# Patient Record
Sex: Female | Born: 1945
Health system: Southern US, Community
[De-identification: ages and names within clinical notes are randomized; demographics above are authoritative.]

## PROBLEM LIST (undated history)

## (undated) DIAGNOSIS — E7800 Pure hypercholesterolemia, unspecified: Secondary | ICD-10-CM

## (undated) DIAGNOSIS — K449 Diaphragmatic hernia without obstruction or gangrene: Secondary | ICD-10-CM

## (undated) DIAGNOSIS — G8929 Other chronic pain: Secondary | ICD-10-CM

## (undated) DIAGNOSIS — K219 Gastro-esophageal reflux disease without esophagitis: Secondary | ICD-10-CM

## (undated) DIAGNOSIS — F419 Anxiety disorder, unspecified: Secondary | ICD-10-CM

## (undated) DIAGNOSIS — R109 Unspecified abdominal pain: Secondary | ICD-10-CM

## (undated) DIAGNOSIS — I10 Essential (primary) hypertension: Secondary | ICD-10-CM

## (undated) HISTORY — PX: ABDOMINAL HYSTERECTOMY: SHX81

---

## 2000-06-15 ENCOUNTER — Inpatient Hospital Stay (HOSPITAL_COMMUNITY): Admission: AD | Admit: 2000-06-15 | Discharge: 2000-06-18 | Payer: Self-pay | Admitting: Family Medicine

## 2000-06-15 ENCOUNTER — Encounter: Payer: Self-pay | Admitting: Family Medicine

## 2001-02-26 ENCOUNTER — Ambulatory Visit (HOSPITAL_COMMUNITY): Admission: RE | Admit: 2001-02-26 | Discharge: 2001-02-26 | Payer: Self-pay | Admitting: Family Medicine

## 2001-02-26 ENCOUNTER — Encounter: Payer: Self-pay | Admitting: Family Medicine

## 2001-06-10 ENCOUNTER — Ambulatory Visit (HOSPITAL_COMMUNITY): Admission: RE | Admit: 2001-06-10 | Discharge: 2001-06-10 | Payer: Self-pay | Admitting: Family Medicine

## 2001-06-10 ENCOUNTER — Encounter: Payer: Self-pay | Admitting: Family Medicine

## 2002-01-21 ENCOUNTER — Inpatient Hospital Stay (HOSPITAL_COMMUNITY): Admission: EM | Admit: 2002-01-21 | Discharge: 2002-01-24 | Payer: Self-pay

## 2002-02-15 ENCOUNTER — Ambulatory Visit (HOSPITAL_COMMUNITY): Admission: RE | Admit: 2002-02-15 | Discharge: 2002-02-15 | Payer: Self-pay | Admitting: Internal Medicine

## 2002-02-28 ENCOUNTER — Ambulatory Visit (HOSPITAL_COMMUNITY): Admission: RE | Admit: 2002-02-28 | Discharge: 2002-02-28 | Payer: Self-pay | Admitting: Family Medicine

## 2002-02-28 ENCOUNTER — Encounter: Payer: Self-pay | Admitting: Family Medicine

## 2002-03-08 ENCOUNTER — Ambulatory Visit (HOSPITAL_COMMUNITY): Admission: RE | Admit: 2002-03-08 | Discharge: 2002-03-08 | Payer: Self-pay | Admitting: Internal Medicine

## 2003-03-20 ENCOUNTER — Ambulatory Visit (HOSPITAL_COMMUNITY): Admission: RE | Admit: 2003-03-20 | Discharge: 2003-03-20 | Payer: Self-pay | Admitting: Family Medicine

## 2003-05-22 ENCOUNTER — Ambulatory Visit (HOSPITAL_COMMUNITY): Admission: RE | Admit: 2003-05-22 | Discharge: 2003-05-22 | Payer: Self-pay | Admitting: Family Medicine

## 2004-03-21 ENCOUNTER — Ambulatory Visit (HOSPITAL_COMMUNITY): Admission: RE | Admit: 2004-03-21 | Discharge: 2004-03-21 | Payer: Self-pay | Admitting: Family Medicine

## 2005-03-12 ENCOUNTER — Ambulatory Visit (HOSPITAL_COMMUNITY): Admission: RE | Admit: 2005-03-12 | Discharge: 2005-03-12 | Payer: Self-pay | Admitting: Family Medicine

## 2005-04-01 ENCOUNTER — Ambulatory Visit (HOSPITAL_COMMUNITY): Admission: RE | Admit: 2005-04-01 | Discharge: 2005-04-01 | Payer: Self-pay | Admitting: Family Medicine

## 2006-04-03 ENCOUNTER — Ambulatory Visit (HOSPITAL_COMMUNITY): Admission: RE | Admit: 2006-04-03 | Discharge: 2006-04-03 | Payer: Self-pay | Admitting: Family Medicine

## 2006-11-26 ENCOUNTER — Ambulatory Visit (HOSPITAL_COMMUNITY): Admission: RE | Admit: 2006-11-26 | Discharge: 2006-11-26 | Payer: Self-pay | Admitting: Family Medicine

## 2007-02-07 ENCOUNTER — Emergency Department (HOSPITAL_COMMUNITY): Admission: EM | Admit: 2007-02-07 | Discharge: 2007-02-07 | Payer: Self-pay | Admitting: Emergency Medicine

## 2007-02-12 ENCOUNTER — Encounter: Payer: Self-pay | Admitting: Orthopedic Surgery

## 2007-02-12 ENCOUNTER — Ambulatory Visit (HOSPITAL_COMMUNITY): Admission: RE | Admit: 2007-02-12 | Discharge: 2007-02-12 | Payer: Self-pay | Admitting: Family Medicine

## 2007-03-25 ENCOUNTER — Ambulatory Visit: Payer: Self-pay | Admitting: Orthopedic Surgery

## 2007-03-25 DIAGNOSIS — M75120 Complete rotator cuff tear or rupture of unspecified shoulder, not specified as traumatic: Secondary | ICD-10-CM | POA: Insufficient documentation

## 2007-03-25 DIAGNOSIS — M25819 Other specified joint disorders, unspecified shoulder: Secondary | ICD-10-CM | POA: Insufficient documentation

## 2007-03-25 DIAGNOSIS — M7580 Other shoulder lesions, unspecified shoulder: Secondary | ICD-10-CM

## 2007-03-25 DIAGNOSIS — E119 Type 2 diabetes mellitus without complications: Secondary | ICD-10-CM | POA: Insufficient documentation

## 2007-03-25 DIAGNOSIS — Z8679 Personal history of other diseases of the circulatory system: Secondary | ICD-10-CM | POA: Insufficient documentation

## 2007-03-25 DIAGNOSIS — M25519 Pain in unspecified shoulder: Secondary | ICD-10-CM | POA: Insufficient documentation

## 2007-04-05 ENCOUNTER — Ambulatory Visit (HOSPITAL_COMMUNITY): Admission: RE | Admit: 2007-04-05 | Discharge: 2007-04-05 | Payer: Self-pay | Admitting: Family Medicine

## 2007-04-13 ENCOUNTER — Encounter (HOSPITAL_COMMUNITY): Admission: RE | Admit: 2007-04-13 | Discharge: 2007-05-13 | Payer: Self-pay | Admitting: Orthopedic Surgery

## 2007-04-13 ENCOUNTER — Encounter: Payer: Self-pay | Admitting: Orthopedic Surgery

## 2007-05-17 ENCOUNTER — Encounter (HOSPITAL_COMMUNITY): Admission: RE | Admit: 2007-05-17 | Discharge: 2007-06-16 | Payer: Self-pay | Admitting: Orthopedic Surgery

## 2007-05-24 ENCOUNTER — Encounter: Payer: Self-pay | Admitting: Orthopedic Surgery

## 2007-05-26 ENCOUNTER — Ambulatory Visit: Payer: Self-pay | Admitting: Orthopedic Surgery

## 2007-06-19 ENCOUNTER — Emergency Department (HOSPITAL_COMMUNITY): Admission: EM | Admit: 2007-06-19 | Discharge: 2007-06-19 | Payer: Self-pay | Admitting: Emergency Medicine

## 2007-06-30 ENCOUNTER — Ambulatory Visit (HOSPITAL_COMMUNITY): Admission: RE | Admit: 2007-06-30 | Discharge: 2007-06-30 | Payer: Self-pay | Admitting: Family Medicine

## 2007-07-05 ENCOUNTER — Ambulatory Visit (HOSPITAL_COMMUNITY): Admission: RE | Admit: 2007-07-05 | Discharge: 2007-07-05 | Payer: Self-pay | Admitting: Neurology

## 2007-07-20 ENCOUNTER — Ambulatory Visit (HOSPITAL_COMMUNITY): Admission: RE | Admit: 2007-07-20 | Discharge: 2007-07-20 | Payer: Self-pay | Admitting: Nephrology

## 2007-08-15 ENCOUNTER — Ambulatory Visit: Admission: RE | Admit: 2007-08-15 | Discharge: 2007-08-15 | Payer: Self-pay | Admitting: Neurology

## 2008-05-17 ENCOUNTER — Ambulatory Visit (HOSPITAL_COMMUNITY): Admission: RE | Admit: 2008-05-17 | Discharge: 2008-05-17 | Payer: Self-pay | Admitting: Family Medicine

## 2009-05-16 ENCOUNTER — Emergency Department (HOSPITAL_COMMUNITY): Admission: EM | Admit: 2009-05-16 | Discharge: 2009-05-16 | Payer: Self-pay | Admitting: Emergency Medicine

## 2009-06-05 ENCOUNTER — Ambulatory Visit (HOSPITAL_COMMUNITY): Admission: RE | Admit: 2009-06-05 | Discharge: 2009-06-05 | Payer: Self-pay | Admitting: Family Medicine

## 2010-04-02 NOTE — Miscellaneous (Signed)
Summary: Rehab Report  Rehab Report   Imported By: Jeanelle Malling 06/03/2007 10:35:17  _____________________________________________________________________  External Attachment:    Type:   Image     Comment:   progress

## 2010-04-02 NOTE — Assessment & Plan Note (Signed)
Summary: lft shoulder pain/to bring mri film and report.ca [id number]   Vital Signs:  Patient Profile:   65 Years Old Female Weight:      220 pounds Pulse rate:   80 / minute Resp:     16 per minute  Vitals Entered By: Peter Minium (March 25, 2007 11:18 AM)                 Chief Complaint:  left shoulder pain.  History of Present Illness: I saw Holly Baldwin in the office today for an initial visit.  Referral from [place].  She is a 65 years old woman with the complaint of:  left shoulder pain with radiation to elbow, no further down.  She fell before July in basement and landed on left shoulder.  Patient had MRI of left shoulder at [facility] on 02-12-07 which stated that she had been having pain 4 months prior to mri.  Mri read small full thickness non-retracted tear of the supraspinatus tendon with associated moderate fluid in the subacromial/sub deltoid bursa. No evidence of a labral tear.  In July 2008 she had xrays at [facility] of shoulder which read normal examination.  She has decreased ROM.  Pain level today is 4, she can not lay on her shoulder or raise arm high, she has problems lifting things.  She has had 2 cortisone shots in the past over a month ago, did not help. She has not had any physical therapy. She does her own exercises for her shoulder, not helping.  She has popping of her shoulder too.    Current Allergies (reviewed today): ! PCN Updated/Current Medications (including changes made in today's visit):  * METFORMIN  * LEVOTHYROXINE  * FLUOXETINE  * BUTALBITAL  * ACTOS  * OMEPRAZOLE  * GEDON  * SIMVASTATIN  * BENICAR  LORTAB 2.5 2.5-500 MG  TABS (HYDROCODONE-ACETAMINOPHEN) 1 q 4 as needed pain   Past Medical History:    Diabetes    Depression    Migraines    High Blood Pressure    cholesterol    thyroid  Past Surgical History:    Cholecystectomy   Family History:    Family History of Arthritis    Family History of  Diabetes    Hx, family, asthma  Social History:    Patient is married.     house wife   Risk Factors:  Tobacco use:  never Caffeine use:  0 drinks per day Alcohol use:  no   Review of Systems  GI      Complains of nausea.  Neuro      Complains of headache and migraines.  Psych      Complains of depression.  Immunology      Complains of sinus problems and allergic to bee stings.   Physical Exam  Skin:     intact without lesions or rashes Cervical Nodes:     no significant adenopathy Psych:     alert and cooperative; normal mood and affect; normal attention span and concentration   Shoulder/Elbow Exam  General:    Well-developed, well-nourished, normal body habitus; no deformities, normal grooming.    Skin:    Intact, no scars, lesions, rashes, cafe au lait spots or bruising.    Vascular:    Radial, ulnar, brachial, and axillary pulses 2+ and symmetric; capillary refill less than 2 seconds; no evidence of ischemia, clubbing, or cyanosis.    Sensory:    Gross sensation intact  in the upper extremities.    Motor:    decreased L-supraspinatus.    right normal   Reflexes:    Normal reflexes in the upper extremities.    Shoulder Exam:    Right:    Inspection:  Normal    Palpation:  Normal    Stability:  stable    Tenderness:  no    Swelling:  no    Erythema:  no    Range of Motion:       Flexion-Active: 180       Extension-Active: 45       Flexion-Passive: 180       Extension-Passive: 45       External Rotation : 45       Interior Rotation : T7    Left:    Inspection:  Abnormal    Palpation:  Abnormal    Stability:  stable    Tenderness:  left deltoid    Swelling:  no    Erythema:  no    Range of Motion:       Flexion-Active: 100       Extension-Active: 45       Flexion-Passive: 180       Extension-Passive: 45       External Rotation : 45       Interior Rotation : T10   LE normal ROM, strength, stability, alignment      Impression & Recommendations:  Problem # 1:  SHOULDER PAIN (ICD-719.41) Assessment: New  Her updated medication list for this problem includes:    Lortab 2.5 2.5-500 Mg Tabs (Hydrocodone-acetaminophen) .Marland Kitchen... 1 q 4 as needed pain  Orders: New Patient Level III [id number]) Joint Aspirate / Injection, Large (20610)   Problem # 2:  RUPTURE ROTATOR CUFF (ICD-727.61) Assessment: New The MRI and the report was reviewed see documents section  The x-rays were done at [facility]. The report and the films have been reviewed.  Orders: New Patient Level III [id number]) Depo- Medrol 40mg  (J1030) Joint Aspirate / Injection, Large (20610) Verbal consent obtained/The left shoulder was injected with depomedrol 40mg /cc and sensorcaine .25% . There were no complications   Orders: New Patient Level III [id number]) Depo- Medrol 40mg  (J1030) Joint Aspirate / Injection, Large [id number])   Problem # 3:  IMPINGEMENT SYNDROME (ICD-726.2) Assessment: New  Orders: New Patient Level III [id number])   Medications Added to Medication List This Visit: 1)  Metformin  2)  Levothyroxine  3)  Fluoxetine  4)  Butalbital  5)  Actos  6)  Omeprazole  7)  Gedon  8)  Simvastatin  9)  Benicar  10)  Lortab 2.5 2.5-500 Mg Tabs (Hydrocodone-acetaminophen) .Marland Kitchen.. 1 q 4 as needed pain  Other Orders: Physical Therapy Referral (PT)   Patient Instructions: 1)  Please schedule a follow-up appointment in 2 months. 2)  Limit activity to comfort and avoid activities that increase discomfort.  Apply moist heat and/or ice to shoulder and take medication as instructed for pain relief. Please read the Shoulder Pain Handout and start Physical Therapy as directed.    Prescriptions: LORTAB 2.5 2.5-500 MG  TABS (HYDROCODONE-ACETAMINOPHEN) 1 q 4 as needed pain  #90 x 2   Entered and Authorized by:   Arther Abbott MD   Signed by:   Arther Abbott MD on 03/25/2007   Method used:   Print then Give to Patient   RxID:    [id number]  ]

## 2010-04-02 NOTE — Assessment & Plan Note (Signed)
Summary: RE-CK LT SHOULDER FOL'G PT/[place] MEDICAID/CAF    History of Present Illness: I saw Holly Baldwin in the office today for a 2 month followup visit.  She is a 65 years old woman with the complaint of:  left shoulder pain.  Patient had an injection in her shoulder on her last visit here. She went to therapy for 6 weeks Patient states that her shoulder feels better. She states that it just has a little irritation. she says that her ROM has improved. She can raise her arm and put it behind her now.    Current Allergies: ! PCN     Review of Systems  MS      Denies joint pain, rheumatoid arthritis, joint swelling, gout, bone cancer, osteoporosis, and .   Physical Exam  Cervical Nodes:     no significant adenopathy Psych:     alert and cooperative; normal mood and affect; normal attention span and concentration   Shoulder/Elbow Exam  General:    Well-developed, well-nourished, normal body habitus; no deformities, normal grooming.    Skin:    Intact, no scars, lesions, rashes, cafe au lait spots or bruising.    Shoulder Exam:    Right:    Inspection:  Normal    Palpation:  Normal    Stability:  stable    Tenderness:  no    Swelling:  no    Erythema:  no    Range of Motion:       Flexion-Active: 180       Extension-Active: 45       Flexion-Passive: 180       Extension-Passive: 45       External Rotation : 45       Interior Rotation : T12    Left:    Inspection:  Normal    Palpation:  Normal    Stability:  stable    Tenderness:  no    Swelling:  no    Erythema:  no    Range of Motion:       Flexion-Active: 180       Extension-Active: 45       Flexion-Passive: 180       Extension-Passive: 45       External Rotation : 45       Interior Rotation : T12  Impingement Sign NEER:    Left negative Impingement Sign HAWKINS:    Left negative Apprehension Sign:    Left negative    Impression & Recommendations:  Problem # 1:  IMPINGEMENT SYNDROME  (ICD-726.2) Assessment: Improved  Orders: Est. Patient Level III [id number])    Patient Instructions: 1)  Please schedule a follow-up appointment as needed.    ]

## 2010-04-02 NOTE — Miscellaneous (Signed)
Summary: Rehab Report  Rehab Report   Imported By: Ruffin Pyo 05/20/2007 14:33:18  _____________________________________________________________________  External Attachment:    Type:   Image     Comment:   OT CLINICAL EVALUATION

## 2010-04-02 NOTE — Miscellaneous (Signed)
Summary: Rehab PT order [facility]  Rehab PT order [facility]   Imported By: Ihor Austin 03/29/2007 18:55:58  _____________________________________________________________________  External Attachment:    Type:   Image     Comment:   External Document

## 2010-04-03 ENCOUNTER — Emergency Department (HOSPITAL_COMMUNITY)
Admission: EM | Admit: 2010-04-03 | Discharge: 2010-04-03 | Disposition: A | Discharge status: Home / Self Care | Payer: Medicare PPO | Attending: Emergency Medicine | Admitting: Emergency Medicine

## 2010-04-03 DIAGNOSIS — Z79899 Other long term (current) drug therapy: Secondary | ICD-10-CM | POA: Insufficient documentation

## 2010-04-03 DIAGNOSIS — L2989 Other pruritus: Secondary | ICD-10-CM | POA: Insufficient documentation

## 2010-04-03 DIAGNOSIS — G4700 Insomnia, unspecified: Secondary | ICD-10-CM | POA: Insufficient documentation

## 2010-04-03 DIAGNOSIS — L298 Other pruritus: Secondary | ICD-10-CM | POA: Insufficient documentation

## 2010-04-03 DIAGNOSIS — E1142 Type 2 diabetes mellitus with diabetic polyneuropathy: Secondary | ICD-10-CM | POA: Insufficient documentation

## 2010-04-03 DIAGNOSIS — E1149 Type 2 diabetes mellitus with other diabetic neurological complication: Secondary | ICD-10-CM | POA: Insufficient documentation

## 2010-04-03 DIAGNOSIS — I10 Essential (primary) hypertension: Secondary | ICD-10-CM | POA: Insufficient documentation

## 2010-04-03 LAB — URINALYSIS, ROUTINE W REFLEX MICROSCOPIC
Bilirubin Urine: NEGATIVE
Ketones, ur: 15 mg/dL — AB
Leukocytes, UA: NEGATIVE
Nitrite: NEGATIVE
Protein, ur: 100 mg/dL — AB
Specific Gravity, Urine: 1.01 (ref 1.005–1.030)
Urine Glucose, Fasting: >1000 mg/dL — AB
Urobilinogen, UA: 0.2 mg/dL (ref 0.0–1.0)
pH: 5.5 (ref 5.0–8.0)

## 2010-04-03 LAB — CBC
HCT: 42.2 % (ref 36.0–46.0)
Hemoglobin: 14.2 g/dL (ref 12.0–15.0)
MCH: 26.3 pg (ref 26.0–34.0)
MCHC: 33.6 g/dL (ref 30.0–36.0)
MCV: 78.3 fL (ref 78.0–100.0)
Platelets: 274 10*3/uL (ref 150–400)
RBC: 5.39 MIL/uL — ABNORMAL HIGH (ref 3.87–5.11)
RDW: 14.4 % (ref 11.5–15.5)
WBC: 7.7 10*3/uL (ref 4.0–10.5)

## 2010-04-03 LAB — DIFFERENTIAL
Basophils Absolute: 0 10*3/uL (ref 0.0–0.1)
Basophils Relative: 0 % (ref 0–1)
Eosinophils Absolute: 0 10*3/uL (ref 0.0–0.7)
Eosinophils Relative: 1 % (ref 0–5)
Lymphocytes Relative: 18 % (ref 12–46)
Lymphs Abs: 1.4 10*3/uL (ref 0.7–4.0)
Monocytes Absolute: 0.5 10*3/uL (ref 0.1–1.0)
Monocytes Relative: 7 % (ref 3–12)
Neutro Abs: 5.6 10*3/uL (ref 1.7–7.7)
Neutrophils Relative %: 74 % (ref 43–77)

## 2010-04-03 LAB — BASIC METABOLIC PANEL
BUN: 11 mg/dL (ref 6–23)
CO2: 27 mEq/L (ref 19–32)
Calcium: 9.8 mg/dL (ref 8.4–10.5)
Chloride: 97 mEq/L (ref 96–112)
Creatinine, Ser: 0.65 mg/dL (ref 0.4–1.2)
GFR calc Af Amer: >60 mL/min (ref >60)
GFR calc non Af Amer: >60 mL/min (ref >60)
Glucose, Bld: 459 mg/dL — ABNORMAL HIGH (ref 70–99)
Potassium: 4.1 mEq/L (ref 3.5–5.1)
Sodium: 137 mEq/L (ref 135–145)

## 2010-04-03 LAB — URINE MICROSCOPIC-ADD ON

## 2010-04-03 LAB — GLUCOSE, CAPILLARY: Glucose-Capillary: 312 mg/dL — ABNORMAL HIGH (ref 70–99)

## 2010-05-24 LAB — DIFFERENTIAL
Basophils Absolute: 0 10*3/uL (ref 0.0–0.1)
Basophils Relative: 0 % (ref 0–1)
Eosinophils Absolute: 0.1 10*3/uL (ref 0.0–0.7)
Eosinophils Relative: 2 % (ref 0–5)
Lymphocytes Relative: 16 % (ref 12–46)
Lymphs Abs: 1.5 10*3/uL (ref 0.7–4.0)
Monocytes Absolute: 0.6 10*3/uL (ref 0.1–1.0)
Monocytes Relative: 7 % (ref 3–12)
Neutro Abs: 6.7 10*3/uL (ref 1.7–7.7)
Neutrophils Relative %: 75 % (ref 43–77)

## 2010-05-24 LAB — CBC
HCT: 40.2 % (ref 36.0–46.0)
Hemoglobin: 13.4 g/dL (ref 12.0–15.0)
MCHC: 33.3 g/dL (ref 30.0–36.0)
MCV: 80.1 fL (ref 78.0–100.0)
Platelets: 264 10*3/uL (ref 150–400)
RBC: 5.02 MIL/uL (ref 3.87–5.11)
RDW: 15 % (ref 11.5–15.5)
WBC: 9 10*3/uL (ref 4.0–10.5)

## 2010-05-24 LAB — COMPREHENSIVE METABOLIC PANEL
ALT: 32 U/L (ref 0–35)
AST: 37 U/L (ref 0–37)
Albumin: 3.6 g/dL (ref 3.5–5.2)
Alkaline Phosphatase: 141 U/L — ABNORMAL HIGH (ref 39–117)
BUN: 9 mg/dL (ref 6–23)
CO2: 27 mEq/L (ref 19–32)
Calcium: 8.8 mg/dL (ref 8.4–10.5)
Chloride: 100 mEq/L (ref 96–112)
Creatinine, Ser: 0.62 mg/dL (ref 0.4–1.2)
GFR calc Af Amer: >60 mL/min (ref >60)
GFR calc non Af Amer: >60 mL/min (ref >60)
Glucose, Bld: 362 mg/dL — ABNORMAL HIGH (ref 70–99)
Potassium: 4.3 mEq/L (ref 3.5–5.1)
Sodium: 135 mEq/L (ref 135–145)
Total Bilirubin: 0.5 mg/dL (ref 0.3–1.2)
Total Protein: 6.7 g/dL (ref 6.0–8.3)

## 2010-05-24 LAB — LIPASE, BLOOD: Lipase: 16 U/L (ref 11–59)

## 2010-07-16 NOTE — Procedures (Signed)
NAME:  Holly Baldwin, Holly Baldwin                  ACCOUNT NO.:  [id number]   MEDICAL RECORD NO.:  [id number]          PATIENT TYPE:  OUT   LOCATION:  SLEE                          FACILITY:  [facility]   PHYSICIAN:  Kofi A. Merlene Laughter, M.D. DATE OF BIRTH:  05-26-1945   DATE OF PROCEDURE:  08/15/2007  DATE OF DISCHARGE:  08/15/2007                             SLEEP DISORDER REPORT   POLYSOMNOGRAPHY REPORT   INDICATIONS:  A 65 year old lady who presents with fatigue, snoring, and  is being evaluated for obstructive sleep apnea syndrome, BMI 38, Epworth  Sleepiness Scale 6.   MEDICATIONS:  Prozac, bisoprolol, oxycodone, Actos, Norvasc, clonidine,  Topamax, Geodon, Fioricet, and Maxalt.   SLEEP STATE SUMMARY:  The total recording time is 435 minutes, sleep  efficiency is 45%, sleep latency 26 minutes, REM latency 376 minutes,  stage N1 22%, N2 70%, slow-wave sleep 0%, and REM sleep 8%.   RESPIRATORY SUMMARY:  Baseline oxygen saturation 98%, lowest saturation  92%, the AHI is 2.5.   LIMB MOVEMENT SUMMARY:  PLM index 0.   ELECTROCARDIOGRAM SUMMARY:  Average heart rate 60 with PVCs noted  throughout the recording.   IMPRESSION:  This recording shows poor sleep efficiency, but otherwise  unremarkable.      Kofi A. Merlene Laughter, M.D.  Electronically Signed     KAD/MEDQ  D:  08/21/2007  T:  08/22/2007  Job:  [id number]

## 2010-07-19 NOTE — Op Note (Signed)
   NAME:  Baldwin, Holly                              ACCOUNT NO.:  [id number]   MEDICAL RECORD NO.:  [id number]                   PATIENT TYPE:  AMB   LOCATION:  DAY                                  FACILITY:  [facility]   PHYSICIAN:  Hildred Laser, M.D.                 DATE OF BIRTH:  22-Feb-1946   DATE OF PROCEDURE:  03/08/2002  DATE OF DISCHARGE:                                 OPERATIVE REPORT   PROCEDURE PERFORMED:  Total colonoscopy.   INDICATIONS FOR PROCEDURE:  The patient is a 65 year old African-American  female who presented last month with epigastric and chest pain as well as  microcytic anemia suspicious for iron deficiency.  She had  esophagogastroduodenoscopy on February 16, 2002.  She had a small sliding  hiatal hernia.  Otherwise normal examination.  She has been maintained on  Protonix and her epigastric and chest pain was resolved.  She is now  returning for colonoscopy.  She denies melena or rectal bleeding.  She has  been on Celebrex in the past.   Family history is negative for colorectal carcinoma.   PREOP MEDICATIONS:  Demerol 50 mg IV, Versed 9 mg IV in divided dose.   INSTRUMENT USED:  Olympus video system.   DESCRIPTION OF PROCEDURE:  Procedure performed in endoscopy suite.  Patient's vital signs and oxygen saturations monitored during the procedure  and remained stable.  The patient was placed in lateral position and rectal  examination performed.  This was within normal limits.  Scope was placed  rectum and advanced to region of sigmoid colon and beyond.  Preparation was  satisfactory.  Scope was passed to the cecum which was identified by the  ileocecal valve and appendiceal orifice.  Pictures taken for the record.  As  the scope was withdrawn, colonic mucosa was carefully examined.  There was a  3 to 4 mm polyp at the distal sigmoid colon which was ablated by cold  biopsy.  Rectal mucosa was normal.  Scope was retroflexed to examine  anorectal area which was  unremarkable.  Endoscope was then withdrawn.  The  patient tolerated the procedure well.   FINAL DIAGNOSIS:  Examination performed to cecum.  Single small polyp  ablated by cold biopsy from the sigmoid colon.   RECOMMENDATIONS:  She will continue antireflux measures and Prilosec as  before.  She will have a CBC by Bonne Dolores, M.D. on her next visit in the  next few weeks.                                               Hildred Laser, M.D.    NR/MEDQ  D:  03/08/2002  T:  03/08/2002  Job:  [id number]

## 2010-07-19 NOTE — Consult Note (Signed)
NAME:  MIMSJermaya, Drucker                              ACCOUNT NO.:  [id number]   MEDICAL RECORD NO.:  [id number]                   PATIENT TYPE:   LOCATION:                                       FACILITY:  [facility]   PHYSICIAN:  Hildred Laser, M.D.                 DATE OF BIRTH:  Oct 25, 1945   DATE OF CONSULTATION:  02/07/2002  DATE OF DISCHARGE:                                   CONSULTATION   REASON FOR CONSULTATION:  Epigastric pain.   HISTORY OF PRESENT ILLNESS:  The patient is a 65 year old black female  patient of Dr. Gerarda Fraction who presents today for further evaluation of epigastric  pain as referred by Dr. Lattie Haw.  She recently saw Dr. Gerarda Fraction in November  2003.  She was having upper respiratory type symptoms.  She was treated, but  continued to feel tired, fatigued, and complained of epigastric and  substernal chest pain.  The pain radiated into her neck.  She was sent to  [facility] for further work-up, to exclude heart disease.  She was  hospitalized at [facility] from November 24 through the 25th.  She had a  stress Cardiolite test which revealed EF of 61% and no evidence of  myocardial or definite scar on the scan.  She also had first degree AV block  seen on EKG.  She was noted to have a hemoglobin 11.3, hematocrit 34.1, MCV  75.8.  Stool was hemoccult-negative.  It was felt that her epigastric  burning was probably secondary to gastroesophageal reflux disease.  She was  placed on Protonix 40 mg b.i.d. and asked to follow up with [place] today.  The  patient says she is having nocturnal symptoms, epigastric burning, bloating  associated with regurgitation of acid.  At times she will gag when she  regurgitates.  She was told to prop herself up in bed which has seemed to  help.  She has typical heartburn symptoms.  She has chronic acid reflux  disease and has been on Prevacid for years, but recently had to switch to  Protonix due to increasing cost of the medication.  Certain foods  definitely  exacerbate her symptoms.  She denies any dysphagia, odynophagia.  Denies any  constipation or diarrhea currently, although she did have some diarrhea  several weeks ago related to a viral infection.  Denies any melena or  bright red blood per rectum.  She has had no changes in her weight.  She has  been on Celebrex off and on for arthritic type pain.  She says she has not  been on any really in the last six weeks, however.   CURRENT MEDICATIONS:  1. Chlorthalidone 25 mg half tablet q.d.  2. Premarin 0.625 mg q.d.  3. Protonix 40 mg b.i.d.  4. Lotrel 5/20 mg q.d.  5. Lotrel 10/20 mg q.d.  6. Glucophage XR 500  mg two tablets q.d.  7. Avandia 8 mg q.d.  8. Albuterol twice q.a.m. and twice q.p.m. nebulizer p.r.n.   ALLERGIES:  No known drug allergies.   PAST MEDICAL HISTORY:  1. Chronic gastroesophageal reflux disease.  She had EGD in 1994 by Dr.     Laural Golden which revealed mild changes in reflux esophagitis with a small     sliding hiatal hernia, proximal gastritis.  2. She has non-insulin-dependent diabetes mellitus.  3. Hypertension.  4. Asthmatic bronchitis.   PAST SURGICAL HISTORY:  1. Hysterectomy.  2. Cholecystectomy.  3. Appendectomy.   FAMILY HISTORY:  Mother died of yellow jaundice.  Father died of a stroke.  He was also diabetic.  She has six sisters and four brothers who are  diabetics.  Denies any family history of colorectal cancer.   SOCIAL HISTORY:  She has been married for 35 years.  She has three grown  children.  She is a housewife.  She has never been a smoker.  Denies any  alcohol use.   REVIEW OF SYSTEMS:  Please see HPI for GI.  CARDIOPULMONARY:  She has  shortness of breath related to her asthmatic bronchitis at times.  Denies  any chest pains in the last couple weeks.  GENERAL:  Weight stable.   PHYSICAL EXAMINATION:  VITAL SIGNS:  Weight 218, height 5 feet 4 inches,  temperature 98.1, blood pressure 124/70, pulse 74.  GENERAL:  Pleasant,  well-nourished, well-developed black female in no acute  distress.  SKIN:  Warm and dry.  No jaundice.  HEENT:  Conjunctivae are pink.  Sclerae nonicteric.  Oropharyngeal mucosa  moist and pink.  No lesions, erythema, or exudate.  NECK:  No lymphadenopathy, thyromegaly, carotid bruits.  CHEST:  Lungs are clear to auscultation.  CARDIAC:  Faint systolic ejection murmur heard best in the upper precordium.  No rubs or gallops.  ABDOMEN:  Positive bowel sounds, obese, but symmetrical.  Mild to moderate  tenderness in the epigastric region with deep palpation.  No organomegaly or  masses.  EXTREMITIES:  No edema.   IMPRESSION:  The patient is a pleasant 65 year old lady who has chronic  gastroesophageal reflux disease with breakthrough symptoms since switching  from Prevacid to Protonix.  She is having epigastric pain as well as  bloating postprandially.  She has been on Celebrex intermittently for  several months, but has not taken any in the last six weeks.  Symptoms are  concerning for poorly controlled gastroesophageal reflux disease plus or  minus peptic ulcer disease.   She was also noted to have a mild microcytic anemia during recent  hospitalization.  She was hemoccult-negative at least once.  Given these  findings, she definitely needs to have her upper GI tract as well as  colonoscopy in the near future.  She would like to proceed with upper  endoscopy at this time with plans to have colonoscopy at some point in the  near future which I feel is reasonable.  I discussed risks, alternatives,  and benefits with the patient.  She is agreeable to proceed.   PLAN:  1. Continue Protonix 40 mg p.o. b.i.d. for now.  2. EGD.  3.     Colonoscopy at some point in the near future.  4. I have asked her to discontinue Celebrex use for now.   I would like to thank Dr. Lattie Haw for allowing [place] to take part in the care  of this patient.    Neil Crouch, P.A.  Hildred Laser, M.D.    LL/MEDQ  D:  02/07/2002  T:  02/07/2002  Job:  [id number]   cc:   Jacqulyn Ducking, M.D. [facility]  [address] [address]  Fax: Ila Gerarda Fraction, M.D.  [address] [address]  Fax: [phone]

## 2010-07-19 NOTE — H&P (Signed)
NAME:  Holly Baldwin, Holly Baldwin NO.:  [id number]   MEDICAL RECORD NO.:  [id number]                   PATIENT TYPE:  EMS   LOCATION:  [facility]                                 FACILITY:  [place]   PHYSICIAN:  Jacqulyn Ducking, M.D. [facility]           DATE OF BIRTH:  [birth year]   DATE OF ADMISSION:  01/21/2002  DATE OF DISCHARGE:                                HISTORY & PHYSICAL   PRIMARY CARDIOLOGIST:  Previously, Hassan Rowan, M.D.   HISTORY OF PRESENT ILLNESS:  A 65 year old woman with normal coronary  angiography six years ago but multiple coronary risk factors including  hypertension and diabetes, now presents with symptoms of an upper  respiratory tract infection, symptoms of GERD, and chest pain, neck pain,  and an abnormal ECG.  The patient was in her usual good state of health  until the past few weeks when she developed mildly productive cough and  wheezing subsequent to a influenza vaccination.  She was evaluated by her  primary care physician and treated with a parental medication followed by  oral medication, presumably an antibiotic.  She subsequently developed  burning discomfort in her stomach with tightness associated with burning in  her chest.  She has had some water brash. She has a history of GERD and is  chronically maintained on a PPI.  She has gotten some relief with Tums.  She  feels the need for eructation, but cannot do so.  She has had some diarrhea  as well. She has had some chills but no definite fever.  Her chest  discomfort is always there to some degree, but sometimes waxes - it is  generally mild.  There has been no associated dyspnea nor diaphoresis.  She  has a chronic history of right neck pain, which continues.  Her symptoms are  no different than they were before this acute illness.   MEDICATIONS:  1. Ziac 5/6.25 mg daily.  2. Premarin 0.625 mg daily.  3. Metformin 1 g daily.  4. Avandia 8 mg daily.  5. Lotrel 10/20 mg  daily.  6. Clarinex 1 daily p.r.n.  7. Celebrex 200 daily p.r.n.  8. Ventolin 2 puffs b.i.d. p.r.n.  9. Protonix 40 mg daily.  10.      Levaquin 500 mg daily.  11.      Aspirin 81 mg daily.   PAST MEDICAL HISTORY:  Notable for 10 years of diabetes that has not  required insulin.  She has had long-standing hypertension that has been well-  controlled.  She has asthmatic bronchitis and GERD.   PAST SURGICAL HISTORY:  Surgical history has included a hysterectomy and  cholecystectomy.   SOCIAL HISTORY:  Lives with her husband, works as a housewife.  No history  of tobacco or alcohol use.   FAMILY HISTORY:  Father suffered a CVA in his 62s. No significant cardiac  disease.   REVIEW  OF SYSTEMS:  Notable for headache, visual disturbance, and a need for  corrective lenses. Being postmenopausal and a history of depression and  anxiety.  All other systems negative.   PHYSICAL EXAMINATION:  VITAL SIGNS:  Temperature is 99, heart rate 80 and  regular, respirations 20, blood pressure 130/60.  O2 saturation 96% on room  air.  GENERAL:  Pleasant woman moving with some difficulty, but not appearing  acutely ill.  HEENT:  Anicteric sclerae.  NECK:  No jugular venous distention, bruit transmitted from the upper chest  bilaterally.  ENDOCRINE:  No thyromegaly.  SKIN:  No significant lesions.  HEMATOPOETIC:  No adenopathy.  LUNGS:  Crystal clear.  CARDIAC:  Normal first and second heart sounds, minimal systolic murmurs,  forth heart sound present.  ABDOMEN:  Tender at the costal margins, no organomegaly, normal bowel  sounds.  EXTREMITY:  No edema. Distal pulses intact.  NEUROMUSCULAR:  Symmetric strength and tone.   ECG:  Normal sinus rhythm, PVCs on some tracings, but not on the tracing  when she came to the emergency department, mild T wave flattening, decreased  voltage in the chest leads.   IMPRESSION/PLAN:  The patient has symptoms of an upper respiratory tract  infection.  The  discomfort in her upper abdomen appears to be related to  coughing.  Chest x-ray is pending - I doubt she has a pneumonia.  Her  abdominal and chest discomfort appear consistent with her known  gastroesophageal reflux disease, although she is taking appropriate  medication.  We will double her dose of proton pump inhibitor and add  antacids if needed. Initial lab does show mild hypokalemia - this along with  her acute noncardiac illness may account for her PVCs.  I doubt that she is  having primary cardiac problems, and do not necessarily plan any cardiac  testing other than to obtain serial markers and ECGs.  We will consult  primary care for management of this woman's hospitalization.                                                Jacqulyn Ducking, M.D. [facility]    RR/MEDQ  D:  01/21/2002  T:  01/22/2002  Job:  [id number]

## 2010-07-19 NOTE — Op Note (Signed)
NAME:  Baldwin, Holly                              ACCOUNT NO.:  [id number]   MEDICAL RECORD NO.:  [id number]                   PATIENT TYPE:  AMB   LOCATION:  DAY                                  FACILITY:  [facility]   PHYSICIAN:  Hildred Laser, M.D.                 DATE OF BIRTH:  03-08-1945   DATE OF PROCEDURE:  02/16/2002  DATE OF DISCHARGE:                                 OPERATIVE REPORT   PROCEDURE:  Esophagogastroduodenoscopy.   INDICATIONS:  The patient is a 65 year old African-American female with  chronic GERD, who was recently evaluated at [facility] for substernal chest pain.  A Cardiolite did not reveal any myocardial ischemia and EF was 61%.  At that  time she was also noted to have mild anemia with MCV of 75.8, but stool was  guaiac-negative.  She has also been complaining of epigastric pain and  having breakthrough symptoms of GERD.  She has been on Celebrex off and on,  but she has not taken it recently.  She is undergoing diagnostic EGD.  The  procedure was reviewed with the patient and informed consent was obtained.   PREMEDICATION:  Cetacaine spray for pharyngeal topical anesthesia, Demerol  50 mg IV, Versed 7 mg IV in divided dose.   INSTRUMENT USED:  Olympus video system.   FINDINGS:  Procedure performed in endoscopy suite.  The patient's vital  signs and O2 saturation were monitored during the procedure and remained  stable.  The patient was placed in the left lateral recumbent position, and  the endoscope was passed via the oropharynx without any difficulty into the  esophagus.   Esophagus:  Mucosa of the esophagus was normal throughout.  The  squamocolumnar junction was unremarkable.  She had a small sliding hiatal  hernia; however, the diaphragmatic hiatus was wide open.   Stomach:  It was empty and distended very well with insufflation.  Folds of  the proximal stomach were normal.  Examination of the mucosa at gastric  body, antrum, pyloric channel, as well as  angularis and fundus, was normal .   Duodenum:  Examination of the bulb revealed normal mucosa.  The scope was  passed to the second part of the duodenum, and the mucosa and folds were  normal.   The endoscope was withdrawn.  The patient tolerated the procedure well.   FINAL DIAGNOSES:  Small sliding hiatal hernia.  No endoscopic evidence of  reflux esophagitis or Barrett's esophagus.  Her diaphragmatic hiatus is wide  open.  This perhaps would explain the difficulty controlling her symptoms.  She may eventually need a promotility agent.   RECOMMENDATIONS:  1. Antireflux measures stressed to the patient.  2.     Will stop her Protonix and try her on Prilosec OTC 20 mg p.o. q.a.m.  3. She will undergo a colonoscopy both for diagnostic and screening purposes  within the next few weeks.  4. If she does not do well with Prilosec, will add metoclopramide at a low     dose.                                               Hildred Laser, M.D.    NR/MEDQ  D:  02/15/2002  T:  02/16/2002  Job:  [id number]   cc:   Sherrilee Gilles. Gerarda Fraction, M.D.  [address] [address]  Fax: [phone]   Jacqulyn Ducking, M.D. [facility]  [address] [address]  Fax: 1

## 2010-07-19 NOTE — Discharge Summary (Signed)
NAME:  Holly Baldwin, Holly Baldwin                              ACCOUNT NO.:  [id number]   MEDICAL RECORD NO.:  [id number]                   PATIENT TYPE:  INP   LOCATION:  2029                                 FACILITY:  [facility]   PHYSICIAN:  Jacqulyn Ducking, M.D. [facility]           DATE OF BIRTH:  04-07-1945   DATE OF ADMISSION:  01/21/2002  DATE OF DISCHARGE:  01/24/2002                           DISCHARGE SUMMARY - REFERRING   DISCHARGE DIAGNOSES:  1. Chest pain, resolved.  2. Epigastric burning, presumed gastroesophageal reflux disease, treated.  3. Hypertension, controlled.  4. First degree AV block.  5. Adult-onset diabetes mellitus, controlled.  6. History of bronchitis.   HISTORY OF PRESENT ILLNESS:  The patient is a 65 year old female patient of  Dr. Bonne Dolores who was recently treated for an upper respiratory  infection.  Despite treatment she continued to feel tired, fatigued, and  complained of epigastric/substernal chest pain.  The pain did radiate into  her neck and she was referred by her primary M.D. for cardiac evaluation.   HOSPITAL COURSE:  She was admitted on January 21, 2002 and laboratory  studies during this time revealed the following:  Hemoglobin 11.3,  hematocrit 34.1, platelets 238, white count 6.4.  Stool heme negative.  Sodium 142, potassium 4.3, glucose 128, BUN 9, creatinine 0.7.  Cardiac  isoenzymes and troponins were negative.  Total cholesterol was 198,  triglycerides 57, HDL 65, LDL 122.   Her EKG revealed normal sinus rhythm with a first degree AV block.   As part of her cardiac workup she did undergo a rest-stress Cardiolite and  her EF at that time was 61%.  There was no evidence of myocardial or  definite scar on the scan.   By January 24, 2002 she was felt to be ready for discharge to home.  She  was discharged to home in stable but improved condition.  At this time we  feel that her treatment should focus around avoiding beta blockers secondary  to her  first degree AV block as well as placing her on a twice-a-day regimen  of Protonix with plans for her to follow up with the GI physician in  [place].   DISCHARGE MEDICATIONS:  1. Chlorthalidone 12.5 mg one p.o. q.d.  2. She is to stop her Ziac.  3. Start Protonix 40 mg b.i.d.  4. Resume all of her other home medications which include:     a. Premarin 0.625 mg a day.     b. Glucophage 500 mg two tablets daily.     c. Avandia 8 mg a day.     d. Lotrel 10/20 one daily.     e. Clarinex p.r.n.     f. Celebrex 200 mg daily p.r.n.     g. Ventolin as needed.     h. An aspirin daily.        a. She may use Tylenol as needed  for pain.   ACTIVITY:  As tolerated.   DIET:  Remain on a low fat diabetic diet.   SPECIAL INSTRUCTIONS:  Call for questions or concerns.   FOLLOW-UP:  I have made her follow-up appointment with Dr. Jacqulyn Ducking  on February 17, 2002 at 1:15 p.m. at the [facility] in [place].     Joesphine Bare, P.A. [facility]                      Jacqulyn Ducking, M.D. [facility]    LB/MEDQ  D:  01/24/2002  T:  01/24/2002  Job:  [id number]   cc:   Bonne Dolores, M.D.  [address] [place] [address]  Fax: [phone]

## 2010-07-23 ENCOUNTER — Other Ambulatory Visit (HOSPITAL_COMMUNITY): Payer: Self-pay | Admitting: Family Medicine

## 2010-07-23 ENCOUNTER — Other Ambulatory Visit (HOSPITAL_COMMUNITY): Payer: Self-pay | Admitting: Internal Medicine

## 2010-07-23 DIAGNOSIS — Z139 Encounter for screening, unspecified: Secondary | ICD-10-CM

## 2010-08-02 ENCOUNTER — Ambulatory Visit (HOSPITAL_COMMUNITY)
Admission: RE | Admit: 2010-08-02 | Discharge: 2010-08-02 | Disposition: A | Discharge status: Home / Self Care | Payer: Medicare Other | Source: Ambulatory Visit | Attending: Family Medicine | Admitting: Family Medicine

## 2010-08-02 DIAGNOSIS — Z1231 Encounter for screening mammogram for malignant neoplasm of breast: Secondary | ICD-10-CM | POA: Insufficient documentation

## 2010-08-02 DIAGNOSIS — Z139 Encounter for screening, unspecified: Secondary | ICD-10-CM

## 2010-08-06 ENCOUNTER — Other Ambulatory Visit: Payer: Self-pay | Admitting: Family Medicine

## 2010-08-06 DIAGNOSIS — R928 Other abnormal and inconclusive findings on diagnostic imaging of breast: Secondary | ICD-10-CM

## 2010-08-14 ENCOUNTER — Ambulatory Visit (HOSPITAL_COMMUNITY)
Admission: RE | Admit: 2010-08-14 | Discharge: 2010-08-14 | Disposition: A | Discharge status: Home / Self Care | Payer: Medicare Other | Source: Ambulatory Visit | Attending: Family Medicine | Admitting: Family Medicine

## 2010-08-14 DIAGNOSIS — R928 Other abnormal and inconclusive findings on diagnostic imaging of breast: Secondary | ICD-10-CM

## 2011-01-17 ENCOUNTER — Emergency Department (HOSPITAL_COMMUNITY)
Admission: EM | Admit: 2011-01-17 | Discharge: 2011-01-17 | Disposition: A | Discharge status: Home / Self Care | Payer: Medicare Other | Attending: Emergency Medicine | Admitting: Emergency Medicine

## 2011-01-17 ENCOUNTER — Encounter: Payer: Self-pay | Admitting: Emergency Medicine

## 2011-01-17 ENCOUNTER — Other Ambulatory Visit: Payer: Self-pay

## 2011-01-17 ENCOUNTER — Encounter (HOSPITAL_COMMUNITY): Payer: Self-pay

## 2011-01-17 ENCOUNTER — Emergency Department (HOSPITAL_COMMUNITY)
Admission: EM | Admit: 2011-01-17 | Discharge: 2011-01-18 | Disposition: A | Discharge status: Home / Self Care | Payer: Medicare Other | Attending: Emergency Medicine | Admitting: Emergency Medicine

## 2011-01-17 ENCOUNTER — Emergency Department (HOSPITAL_COMMUNITY): Payer: Medicare Other

## 2011-01-17 DIAGNOSIS — R1084 Generalized abdominal pain: Secondary | ICD-10-CM | POA: Insufficient documentation

## 2011-01-17 DIAGNOSIS — Z9889 Other specified postprocedural states: Secondary | ICD-10-CM | POA: Insufficient documentation

## 2011-01-17 DIAGNOSIS — R143 Flatulence: Secondary | ICD-10-CM | POA: Insufficient documentation

## 2011-01-17 DIAGNOSIS — I10 Essential (primary) hypertension: Secondary | ICD-10-CM | POA: Insufficient documentation

## 2011-01-17 DIAGNOSIS — Z9079 Acquired absence of other genital organ(s): Secondary | ICD-10-CM | POA: Insufficient documentation

## 2011-01-17 DIAGNOSIS — R1012 Left upper quadrant pain: Secondary | ICD-10-CM | POA: Insufficient documentation

## 2011-01-17 DIAGNOSIS — R51 Headache: Secondary | ICD-10-CM | POA: Insufficient documentation

## 2011-01-17 DIAGNOSIS — R142 Eructation: Secondary | ICD-10-CM | POA: Insufficient documentation

## 2011-01-17 DIAGNOSIS — R079 Chest pain, unspecified: Secondary | ICD-10-CM | POA: Insufficient documentation

## 2011-01-17 DIAGNOSIS — K5900 Constipation, unspecified: Secondary | ICD-10-CM | POA: Insufficient documentation

## 2011-01-17 DIAGNOSIS — I498 Other specified cardiac arrhythmias: Secondary | ICD-10-CM | POA: Insufficient documentation

## 2011-01-17 DIAGNOSIS — E7800 Pure hypercholesterolemia, unspecified: Secondary | ICD-10-CM | POA: Insufficient documentation

## 2011-01-17 DIAGNOSIS — Z794 Long term (current) use of insulin: Secondary | ICD-10-CM | POA: Insufficient documentation

## 2011-01-17 DIAGNOSIS — E119 Type 2 diabetes mellitus without complications: Secondary | ICD-10-CM | POA: Insufficient documentation

## 2011-01-17 DIAGNOSIS — F411 Generalized anxiety disorder: Secondary | ICD-10-CM | POA: Insufficient documentation

## 2011-01-17 DIAGNOSIS — R1110 Vomiting, unspecified: Secondary | ICD-10-CM

## 2011-01-17 DIAGNOSIS — R112 Nausea with vomiting, unspecified: Secondary | ICD-10-CM | POA: Insufficient documentation

## 2011-01-17 DIAGNOSIS — R10819 Abdominal tenderness, unspecified site: Secondary | ICD-10-CM | POA: Insufficient documentation

## 2011-01-17 DIAGNOSIS — R1032 Left lower quadrant pain: Secondary | ICD-10-CM | POA: Insufficient documentation

## 2011-01-17 DIAGNOSIS — R141 Gas pain: Secondary | ICD-10-CM | POA: Insufficient documentation

## 2011-01-17 HISTORY — DX: Essential (primary) hypertension: I10

## 2011-01-17 HISTORY — DX: Anxiety disorder, unspecified: F41.9

## 2011-01-17 HISTORY — DX: Pure hypercholesterolemia, unspecified: E78.00

## 2011-01-17 LAB — HEPATIC FUNCTION PANEL
ALT: 24 U/L (ref 0–35)
AST: 20 U/L (ref 0–37)
Albumin: 4.3 g/dL (ref 3.5–5.2)
Alkaline Phosphatase: 142 U/L — ABNORMAL HIGH (ref 39–117)
Bilirubin, Direct: <0.1 mg/dL (ref 0.0–0.3)
Total Bilirubin: 0.3 mg/dL (ref 0.3–1.2)
Total Protein: 7.9 g/dL (ref 6.0–8.3)

## 2011-01-17 LAB — BASIC METABOLIC PANEL
BUN: 10 mg/dL (ref 6–23)
CO2: 26 mEq/L (ref 19–32)
Calcium: 10 mg/dL (ref 8.4–10.5)
Chloride: 102 mEq/L (ref 96–112)
Creatinine, Ser: 0.51 mg/dL (ref 0.50–1.10)
GFR calc Af Amer: >90 mL/min (ref >90)
GFR calc non Af Amer: >90 mL/min (ref >90)
Glucose, Bld: 276 mg/dL — ABNORMAL HIGH (ref 70–99)
Potassium: 3.9 mEq/L (ref 3.5–5.1)
Sodium: 140 mEq/L (ref 135–145)

## 2011-01-17 LAB — DIFFERENTIAL
Basophils Absolute: 0.1 10*3/uL (ref 0.0–0.1)
Basophils Relative: 1 % (ref 0–1)
Eosinophils Absolute: 0.1 10*3/uL (ref 0.0–0.7)
Eosinophils Relative: 1 % (ref 0–5)
Lymphocytes Relative: 15 % (ref 12–46)
Lymphs Abs: 1.4 10*3/uL (ref 0.7–4.0)
Monocytes Absolute: 0.5 10*3/uL (ref 0.1–1.0)
Monocytes Relative: 5 % (ref 3–12)
Neutro Abs: 7.3 10*3/uL (ref 1.7–7.7)
Neutrophils Relative %: 78 % — ABNORMAL HIGH (ref 43–77)

## 2011-01-17 LAB — CBC
HCT: 42.9 % (ref 36.0–46.0)
Hemoglobin: 13.9 g/dL (ref 12.0–15.0)
MCH: 25.8 pg — ABNORMAL LOW (ref 26.0–34.0)
MCHC: 32.4 g/dL (ref 30.0–36.0)
MCV: 79.6 fL (ref 78.0–100.0)
Platelets: 295 10*3/uL (ref 150–400)
RBC: 5.39 MIL/uL — ABNORMAL HIGH (ref 3.87–5.11)
RDW: 14.6 % (ref 11.5–15.5)
WBC: 9.4 10*3/uL (ref 4.0–10.5)

## 2011-01-17 MED ORDER — IOHEXOL 300 MG/ML  SOLN
100.0000 mL | Freq: Once | INTRAMUSCULAR | Status: AC | PRN
  Administered 2011-01-17: 100 mL via INTRAVENOUS

## 2011-01-17 MED ORDER — FLEET ENEMA 7-19 GM/118ML RE ENEM
1.0000 | ENEMA | Freq: Once | RECTAL | Status: AC
  Administered 2011-01-17: 1 via RECTAL

## 2011-01-17 MED ORDER — ONDANSETRON 8 MG PO TBDP
8.0000 mg | ORAL_TABLET | Freq: Once | ORAL | Status: AC
  Administered 2011-01-17: 8 mg via ORAL
  Filled 2011-01-17: qty 1

## 2011-01-17 MED ORDER — SODIUM CHLORIDE 0.9 % IV SOLN
Freq: Once | INTRAVENOUS | Status: AC
  Administered 2011-01-17: 15:00:00 via INTRAVENOUS

## 2011-01-17 NOTE — ED Notes (Addendum)
Pt states no bm x 7 days. Upper abd sharp pains with nausea started last night. Pain from abd radiated into chest and down l arm this am. Pt is alert/oriented. Nondiaphoretic. cbg en route 300.pt appears uncomfortable at this time. Denies gu sx's. C/o some dizziness with movement. Pt c/o headache x 1 week. C/o intermittant trouble swallowing x 1 week. L hand faintly weaker than r . Pupils perrla. Smile slightly drooped on L side. Moving all extremities. edp aware of pt.

## 2011-01-17 NOTE — ED Notes (Signed)
Pt expelled enema, states feels better, no solid stool in this movement.

## 2011-01-17 NOTE — ED Notes (Signed)
Pt seeen here today. Was told to try mag citrate, pt states had a small movment today but unable to keep mag citrate down.

## 2011-01-17 NOTE — ED Notes (Signed)
Pt now states she was here earlier today and was told to get Mag Citrate. Pt states she tried to take it but vomited. Pt states she was diagnosed with constipation.

## 2011-01-17 NOTE — ED Provider Notes (Signed)
History    Scribed for Holly Speak, MD, the patient was seen in room [facility]. This chart was scribed by Lyndee Hensen.   CSN: [id number] Arrival date & time: 01/17/2011 12:58 PM   First MD Initiated Contact with Patient 01/17/11 1303      Chief Complaint  Patient presents with  . Abdominal Pain  . Chest Pain  . Constipation    (Consider location/radiation/quality/duration/timing/severity/associated sxs/prior treatment) Patient is a 65 y.o. female presenting with abdominal pain, chest pain, and constipation. The history is provided by a relative and the patient. No language interpreter was used.  Abdominal Pain The primary symptoms of the illness include abdominal pain and nausea. The primary symptoms of the illness do not include fever, vomiting, diarrhea or dysuria. Episode onset: about a week ago. The onset of the illness was gradual. The problem has been gradually worsening.  The abdominal pain is located in the LUQ and LLQ. The abdominal pain radiates to the chest. The severity of the abdominal pain is 10/10. The abdominal pain is relieved by nothing.  The illness is associated with awakening from sleep. Additional symptoms associated with the illness include constipation and back pain.  Chest Pain Primary symptoms include abdominal pain and nausea. Pertinent negatives for primary symptoms include no fever and no vomiting.    Constipation  Associated symptoms include abdominal pain, nausea, chest pain and headaches. Pertinent negatives include no fever, no diarrhea and no vomiting.  Last BM was a week ago.  Patient has history of DM, HTN and hypercholesterolemia and surgical history of appendectomy, cholecystectomy and abdominal hysterectomy.     Past Medical History  Diagnosis Date  . Hypertension   . Diabetes mellitus   . Hypercholesteremia   . Anxiety     Past Surgical History  Procedure Date  . Abdominal hysterectomy     History reviewed. No pertinent  family history.  History  Substance Use Topics  . Smoking status: Never Smoker   . Smokeless tobacco: Not on file  . Alcohol Use: No    OB History    Grav Para Term Preterm Abortions TAB SAB Ect Mult Living                  Review of Systems  Constitutional: Negative for fever.  HENT: Positive for trouble swallowing.   Cardiovascular: Positive for chest pain.  Gastrointestinal: Positive for nausea, abdominal pain and constipation. Negative for vomiting and diarrhea.  Genitourinary: Negative for dysuria.  Musculoskeletal: Positive for back pain.  Neurological: Positive for headaches.  All other systems reviewed and are negative.    Allergies  Penicillins  Home Medications   Current Outpatient Rx  Name Route Sig Dispense Refill  . CLONIDINE HCL 0.2 MG PO TABS Oral Take 0.2 mg by mouth 2 (two) times daily.      Marland Kitchen EXENATIDE 5 MCG/0.02ML [place] SOLN Subcutaneous Inject 5 mcg into the skin 2 (two) times daily.      . INSULIN ASPART 100 UNIT/ML [place] SOLN Subcutaneous Inject 50 Units into the skin 2 (two) times daily.      . INSULIN GLARGINE 100 UNIT/ML [place] SOLN Subcutaneous Inject 60 Units into the skin at bedtime.      Marland Kitchen METFORMIN HCL 500 MG PO TABS Oral Take 500 mg by mouth 2 (two) times daily.      Marland Kitchen HAIR/SKIN/NAILS PO Oral Take 1 tablet by mouth daily.      Marland Kitchen SIMVASTATIN 40 MG PO TABS Oral Take 40 mg by  mouth at bedtime.      Marland Kitchen ZIPRASIDONE HCL 80 MG PO CAPS Oral Take 80 mg by mouth at bedtime.      Marland Kitchen ZOLPIDEM TARTRATE 5 MG PO TABS Oral Take 5 mg by mouth at bedtime as needed. Sleep       BP 161/70  Pulse 109  Temp(Src) 99 F (37.2 C) (Oral)  Resp 16  SpO2 95%  Physical Exam  Nursing note and vitals reviewed. Constitutional: She is oriented to person, place, and time. She appears well-developed and well-nourished.  HENT:  Head: Normocephalic and atraumatic.  Mouth/Throat: Oropharynx is clear and moist.  Eyes: Conjunctivae and EOM are normal.  Neck: Neck supple.    Cardiovascular: Normal rate and regular rhythm.   Pulmonary/Chest: Effort normal and breath sounds normal. No respiratory distress. She has no wheezes.  Abdominal: Soft. She exhibits distension. There is tenderness (diffusely ). There is no rebound and no guarding.  Musculoskeletal: Normal range of motion. She exhibits no edema.  Neurological: She is alert and oriented to person, place, and time. No sensory deficit.  Skin: Skin is warm and dry. No rash noted.  Psychiatric: She has a normal mood and affect. Her behavior is normal.    ED Course  Procedures (including critical care time)   DIAGNOSTIC STUDIES: Oxygen Saturation is 97% on room air, normal by my interpretation.    COORDINATION OF CARE:  1:17 PM  Physical exam complete.  Will order labs and CT abdomen.  3:07 PM  Reviewed ABD CT.  3:17 PM  Reviewed radiological findings with patient.  Plan to discharge patient.  Patient agrees with plan.     Orders Placed This Encounter  Procedures  . CT Head Wo Contrast  . CT Abdomen Pelvis W Contrast  . CBC  . Differential  . Basic metabolic panel  . Hepatic function panel  . Cardiac monitoring  . ED EKG  . Saline lock IV     LABS / RADIOLOGY:    Labs Reviewed  CBC - Abnormal; Notable for the following:    RBC 5.39 (*)    MCH 25.8 (*)    All other components within normal limits  DIFFERENTIAL - Abnormal; Notable for the following:    Neutrophils Relative 78 (*)    All other components within normal limits  BASIC METABOLIC PANEL - Abnormal; Notable for the following:    Glucose, Bld 276 (*)    All other components within normal limits  HEPATIC FUNCTION PANEL - Abnormal; Notable for the following:    Alkaline Phosphatase 142 (*)    All other components within normal limits   Ct Head Wo Contrast  01/17/2011  *RADIOLOGY REPORT*  Clinical Data: Abdominal pain, chest pain  CT HEAD WITHOUT CONTRAST  Technique:  Contiguous axial images were obtained from the base of the  skull through the vertex without contrast.  Comparison: Head CT 06/19/2007, MRI brain 07/05/2007  Findings: No acute intracranial hemorrhage.  No focal mass lesion. No CT evidence of acute infarction.   No midline shift or mass effect.  No hydrocephalus.  Basilar cisterns are patent.  The there is a subtle white matter hypodensity in the subcortical right parietal lobe this corresponds to the white matter disease on comparison MRI. Several additional foci of subcortical white matter hypodensities scattered throughout the left and right cerebral hemispheres  Paranasal sinuses and mastoid air cells are clear.  Orbits are normal.  IMPRESSION:  1.  No acute intracranial findings. 2.  Dema Severin  matter microvascular disease is similar to comparison MRI.  Original Report Authenticated By: Suzy Bouchard, M.D.   Ct Abdomen Pelvis W Contrast  01/17/2011  *RADIOLOGY REPORT*  Clinical Data: Abdominal pain/distention.  Constipation.  CT ABDOMEN AND PELVIS WITH CONTRAST  Technique:  Multidetector CT imaging of the abdomen and pelvis was performed following the standard protocol during bolus administration of intravenous contrast.  Contrast: 126mL OMNIPAQUE IOHEXOL 300 MG/ML IV SOLN  Comparison: 05/16/2009.  MRI abdomen of 06/30/2007  Findings: Clear lung bases.  Heart size upper normal without pericardial or pleural effusion.  Tiny hiatal hernia.  Hepatic steatosis.  Vague area of hypoattenuation in the posterior right hepatic lobe measures 1.3 cm on image 9.  This was present on image 32 of series 6 of the 06/30/2007 MRA, consistent with a stable benign etiology.  A splenule.  Normal stomach.  Partially fatty replaced pancreas. Cholecystectomy without biliary ductal dilatation.  Normal adrenal glands.  Too small to characterize lesions within bilateral kidneys.  Lower pole left renal collecting system calculus versus minimal parenchymal dystrophic calcification.  Image 30.  Unchanged.  Minimal bilateral caliectasis is felt to  be physiologic.  No hydroureter.  No retroperitoneal or retrocrural adenopathy.  Pelvic floor laxity. Colonic stool burden suggests constipation.  Hepatic flexure colon is underdistended.  Normal terminal ileum. The appendix appears prominent, including on image 54.  It is however, gas filled without evidence of underlying inflammation or mass.  This was similar on the prior. Normal small bowel without abdominal ascites.    No pelvic adenopathy.  Normal urinary bladder.  Hysterectomy.  No adnexal mass or significant free pelvic fluid.  Subcutaneous edema about the right side of the abdomen on image 39 could be due to injection sites.  Increased since the prior exam. No acute osseous abnormality.  IMPRESSION:  1. No acute process in the abdomen or pelvis. 2.  Hepatic steatosis. 3.  Left renal collecting system calculus versus dystrophic parenchymal calcification. 4. Possible constipation. 5.  Pelvic floor laxity.  Original Report Authenticated By: Areta Haber, M.D.     Date: 01/17/2011  Rate: 111  Rhythm: sinus tachycardia  QRS Axis: normal  Intervals: normal  ST/T Wave abnormalities: nonspecific ST changes  Conduction Disutrbances:none  Narrative Interpretation:   Old EKG Reviewed: unchanged       MDM   MDM: CT of abdomen shows constipation, but no acute process.  Will treat with mag citrate.    Also, the patient reported headaches, the RN thought she may have seen a facial droop.  This was evaluated with a ct scan and was unremarkable as well.       MEDICATIONS GIVEN IN THE E.D. Scheduled Meds:    . sodium chloride   Intravenous Once   Continuous Infusions:      IMPRESSION: No diagnosis found.   DISCHARGE MEDICATIONS: New Prescriptions   No medications on file      I personally performed the services described in this documentation, which was scribed in my presence. The recorded information has been reviewed and considered.              Holly Speak,  MD 01/17/11 [phone]

## 2011-01-17 NOTE — ED Notes (Signed)
Pt presents with generalized abd pain and vomiting since last night. No active vomiting. Pt denies all other symptoms.

## 2011-01-17 NOTE — ED Provider Notes (Signed)
Scribed for Holly Norrie, MD, the patient was seen in room APA09/APA09 . This chart was scribed by Glory Buff.   CSN: [id number] Arrival date & time: 01/17/2011  8:31 PM   First MD Initiated Contact with Patient 01/17/11 2117      Chief Complaint  Patient presents with  . Abdominal Pain  . Emesis    (Consider location/radiation/quality/duration/timing/severity/associated sxs/prior treatment) HPI Holly Baldwin is a 65 y.o. female who presents to the Emergency Department complaining of 1 day of  generalized abdominal pain with associated n/vx2 today. Pain started last night. Pt was seen in ED earlier today, was dx with constipation by CT scan of AP, and told to get Mag Citrate. Pt treated with mag citrate today and was able to have a small BM of small hard balls. Abdominal pain currently persists. Has been constant since onset. Nothing improves or worsens.  Pt has h/o chronic constipation. Prior to today last BM was over 1 week ago. Pt usually treats with exlax which does not work. Pt also says she tried Miralax 2 times last week which did also not improve.     PCP sees PA Delman Cheadle at [place] under Dr. Hilma Favors  Past Medical History  Diagnosis Date  . Hypertension   . Diabetes mellitus   . Hypercholesteremia   . Anxiety     Past Surgical History  Procedure Date  . Abdominal hysterectomy     No family history on file.  History  Substance Use Topics  . Smoking status: Never Smoker   . Smokeless tobacco: Not on file  . Alcohol Use: No    Review of Systems  Gastrointestinal: Positive for nausea, vomiting, abdominal pain and constipation.  All other systems reviewed and are negative.    Allergies  Bee venom and Penicillins  Home Medications   Current Outpatient Rx  Name Route Sig Dispense Refill  . CLONIDINE HCL 0.2 MG PO TABS Oral Take 0.2 mg by mouth 2 (two) times daily.      Marland Kitchen EXENATIDE 5 MCG/0.02ML [place] SOLN Subcutaneous Inject 5 mcg into the skin 2 (two)  times daily.     . INSULIN ASPART 100 UNIT/ML [place] SOLN Subcutaneous Inject 15 Units into the skin 3 (three) times daily before meals.      . INSULIN GLARGINE 100 UNIT/ML  SOLN Subcutaneous Inject 60 Units into the skin at bedtime.      Marland Kitchen METFORMIN HCL 500 MG PO TABS Oral Take 500 mg by mouth 2 (two) times daily.      Marland Kitchen HAIR/SKIN/NAILS PO Oral Take 1 tablet by mouth daily.      Marland Kitchen SIMVASTATIN 40 MG PO TABS Oral Take 40 mg by mouth at bedtime.      Marland Kitchen ZIPRASIDONE HCL 80 MG PO CAPS Oral Take 80 mg by mouth at bedtime.      Marland Kitchen ZOLPIDEM TARTRATE 5 MG PO TABS Oral Take 5 mg by mouth at bedtime as needed. Sleep       BP 177/77  Pulse 93  Temp(Src) 99.1 F (37.3 C) (Oral)  Resp 20  Ht 5\' 4"  (1.626 m)  Wt 220 lb (99.791 kg)  BMI 37.76 kg/m2  SpO2 96%  Vital signs hypertension  Physical Exam  Vitals reviewed. Constitutional: She is oriented to person, place, and time. She appears well-developed and well-nourished.       Obese  HENT:  Head: Normocephalic and atraumatic.  Eyes: Conjunctivae and EOM are normal. Pupils are equal, round, and  reactive to light.  Neck: Normal range of motion. Neck supple.  Cardiovascular: Normal rate, regular rhythm and normal heart sounds.   Pulmonary/Chest: Effort normal and breath sounds normal.  Abdominal: Soft. She exhibits distension. There is no rebound and no guarding.       Mild diffuse tenderness without localization  Musculoskeletal: Normal range of motion.  Neurological: She is alert and oriented to person, place, and time.  Skin: Skin is warm and dry.  Psychiatric:       Affect is flat    ED Course  Procedures  OTHER DATA REVIEWED: Nursing notes, vital signs, and past medical records reviewed.   DIAGNOSTIC STUDIES: Oxygen Saturation is 96% on room air, normal by my interpretation.     Labs Reviewed - No data to display Ct Head Wo Contrast  01/17/2011  *RADIOLOGY REPORT*  Clinical Data: Abdominal pain, chest pain  CT HEAD WITHOUT  CONTRAST  Technique:  Contiguous axial images were obtained from the base of the skull through the vertex without contrast.  Comparison: Head CT 06/19/2007, MRI brain 07/05/2007  Findings: No acute intracranial hemorrhage.  No focal mass lesion. No CT evidence of acute infarction.   No midline shift or mass effect.  No hydrocephalus.  Basilar cisterns are patent.  The there is a subtle white matter hypodensity in the subcortical right parietal lobe this corresponds to the white matter disease on comparison MRI. Several additional foci of subcortical white matter hypodensities scattered throughout the left and right cerebral hemispheres  Paranasal sinuses and mastoid air cells are clear.  Orbits are normal.  IMPRESSION:  1.  No acute intracranial findings. 2.  White matter microvascular disease is similar to comparison MRI.  Original Report Authenticated By: Suzy Bouchard, M.D.   Ct Abdomen Pelvis W Contrast  01/17/2011  *RADIOLOGY REPORT*  Clinical Data: Abdominal pain/distention.  Constipation.  CT ABDOMEN AND PELVIS WITH CONTRAST  Technique:  Multidetector CT imaging of the abdomen and pelvis was performed following the standard protocol during bolus administration of intravenous contrast.  Contrast: 173mL OMNIPAQUE IOHEXOL 300 MG/ML IV SOLN  Comparison: 05/16/2009.  MRI abdomen of 06/30/2007  Findings: Clear lung bases.  Heart size upper normal without pericardial or pleural effusion.  Tiny hiatal hernia.  Hepatic steatosis.  Vague area of hypoattenuation in the posterior right hepatic lobe measures 1.3 cm on image 9.  This was present on image 32 of series 6 of the 06/30/2007 MRA, consistent with a stable benign etiology.  A splenule.  Normal stomach.  Partially fatty replaced pancreas. Cholecystectomy without biliary ductal dilatation.  Normal adrenal glands.  Too small to characterize lesions within bilateral kidneys.  Lower pole left renal collecting system calculus versus minimal parenchymal dystrophic  calcification.  Image 30.  Unchanged.  Minimal bilateral caliectasis is felt to be physiologic.  No hydroureter.  No retroperitoneal or retrocrural adenopathy.  Pelvic floor laxity. Colonic stool burden suggests constipation.  Hepatic flexure colon is underdistended.  Normal terminal ileum. The appendix appears prominent, including on image 54.  It is however, gas filled without evidence of underlying inflammation or mass.  This was similar on the prior. Normal small bowel without abdominal ascites.    No pelvic adenopathy.  Normal urinary bladder.  Hysterectomy.  No adnexal mass or significant free pelvic fluid.  Subcutaneous edema about the right side of the abdomen on image 39 could be due to injection sites.  Increased since the prior exam. No acute osseous abnormality.  IMPRESSION:  1. No acute  process in the abdomen or pelvis. 2.  Hepatic steatosis. 3.  Left renal collecting system calculus versus dystrophic parenchymal calcification. 4. Possible constipation. 5.  Pelvic floor laxity.  Original Report Authenticated By: Areta Haber, M.D.   ED MEDICATIONS Medications  ondansetron (ZOFRAN-ODT) disintegrating tablet 8 mg (8 mg Oral Given 01/17/11 2153)  sodium phosphate (FLEET) 7-19 GM/118ML enema 1 enema (1 enema Rectal Given 01/17/11 2153)   Course patient was given a Fleet enema in the ER she states she had some small results.  Diagnoses that have been ruled out:  Diagnoses that are still under consideration:  Final diagnoses:  Constipation  Vomiting      MDM    I personally performed the services described in this documentation, which was scribed in my presence. The recorded information has been reviewed and considered.  Rolland Porter, MD, Abram Sander       Holly Norrie, MD 01/18/11 [phone]

## 2011-01-17 NOTE — ED Notes (Signed)
Pt assisted to bedside commode

## 2011-07-18 ENCOUNTER — Other Ambulatory Visit (HOSPITAL_COMMUNITY): Payer: Self-pay | Admitting: Family Medicine

## 2011-07-18 DIAGNOSIS — Z139 Encounter for screening, unspecified: Secondary | ICD-10-CM

## 2011-08-18 ENCOUNTER — Ambulatory Visit (HOSPITAL_COMMUNITY)
Admission: RE | Admit: 2011-08-18 | Discharge: 2011-08-18 | Disposition: A | Discharge status: Home / Self Care | Payer: Medicare Other | Source: Ambulatory Visit | Attending: Family Medicine | Admitting: Family Medicine

## 2011-08-18 DIAGNOSIS — Z1231 Encounter for screening mammogram for malignant neoplasm of breast: Secondary | ICD-10-CM | POA: Insufficient documentation

## 2011-08-18 DIAGNOSIS — Z139 Encounter for screening, unspecified: Secondary | ICD-10-CM

## 2011-09-01 ENCOUNTER — Emergency Department (HOSPITAL_COMMUNITY)
Admission: EM | Admit: 2011-09-01 | Discharge: 2011-09-01 | Disposition: A | Discharge status: Home / Self Care | Payer: Medicare Other | Attending: Emergency Medicine | Admitting: Emergency Medicine

## 2011-09-01 ENCOUNTER — Encounter (HOSPITAL_COMMUNITY): Payer: Self-pay | Admitting: *Deleted

## 2011-09-01 ENCOUNTER — Emergency Department (HOSPITAL_COMMUNITY): Payer: Medicare Other

## 2011-09-01 DIAGNOSIS — R109 Unspecified abdominal pain: Secondary | ICD-10-CM

## 2011-09-01 DIAGNOSIS — R1013 Epigastric pain: Secondary | ICD-10-CM | POA: Insufficient documentation

## 2011-09-01 DIAGNOSIS — E7800 Pure hypercholesterolemia, unspecified: Secondary | ICD-10-CM | POA: Insufficient documentation

## 2011-09-01 DIAGNOSIS — I10 Essential (primary) hypertension: Secondary | ICD-10-CM | POA: Insufficient documentation

## 2011-09-01 DIAGNOSIS — E119 Type 2 diabetes mellitus without complications: Secondary | ICD-10-CM | POA: Insufficient documentation

## 2011-09-01 DIAGNOSIS — R142 Eructation: Secondary | ICD-10-CM | POA: Insufficient documentation

## 2011-09-01 DIAGNOSIS — R141 Gas pain: Secondary | ICD-10-CM | POA: Insufficient documentation

## 2011-09-01 DIAGNOSIS — R110 Nausea: Secondary | ICD-10-CM

## 2011-09-01 DIAGNOSIS — R10819 Abdominal tenderness, unspecified site: Secondary | ICD-10-CM | POA: Insufficient documentation

## 2011-09-01 DIAGNOSIS — R143 Flatulence: Secondary | ICD-10-CM | POA: Insufficient documentation

## 2011-09-01 DIAGNOSIS — F411 Generalized anxiety disorder: Secondary | ICD-10-CM | POA: Insufficient documentation

## 2011-09-01 DIAGNOSIS — Z79899 Other long term (current) drug therapy: Secondary | ICD-10-CM | POA: Insufficient documentation

## 2011-09-01 DIAGNOSIS — K5900 Constipation, unspecified: Secondary | ICD-10-CM | POA: Insufficient documentation

## 2011-09-01 LAB — COMPREHENSIVE METABOLIC PANEL
ALT: 30 U/L (ref 0–35)
AST: 23 U/L (ref 0–37)
Albumin: 3.9 g/dL (ref 3.5–5.2)
Alkaline Phosphatase: 159 U/L — ABNORMAL HIGH (ref 39–117)
BUN: 10 mg/dL (ref 6–23)
CO2: 23 mEq/L (ref 19–32)
Calcium: 9.6 mg/dL (ref 8.4–10.5)
Chloride: 98 mEq/L (ref 96–112)
Creatinine, Ser: 0.54 mg/dL (ref 0.50–1.10)
GFR calc Af Amer: >90 mL/min (ref >90)
GFR calc non Af Amer: >90 mL/min (ref >90)
Glucose, Bld: 378 mg/dL — ABNORMAL HIGH (ref 70–99)
Potassium: 3.4 mEq/L — ABNORMAL LOW (ref 3.5–5.1)
Sodium: 136 mEq/L (ref 135–145)
Total Bilirubin: 0.2 mg/dL — ABNORMAL LOW (ref 0.3–1.2)
Total Protein: 7.2 g/dL (ref 6.0–8.3)

## 2011-09-01 LAB — URINALYSIS, ROUTINE W REFLEX MICROSCOPIC
Bilirubin Urine: NEGATIVE
Glucose, UA: >1000 mg/dL — AB
Ketones, ur: 15 mg/dL — AB
Leukocytes, UA: NEGATIVE
Nitrite: NEGATIVE
Protein, ur: 100 mg/dL — AB
Specific Gravity, Urine: 1.01 (ref 1.005–1.030)
Urobilinogen, UA: 0.2 mg/dL (ref 0.0–1.0)
pH: 6 (ref 5.0–8.0)

## 2011-09-01 LAB — URINE MICROSCOPIC-ADD ON

## 2011-09-01 LAB — CBC WITH DIFFERENTIAL/PLATELET
Basophils Absolute: 0 10*3/uL (ref 0.0–0.1)
Basophils Relative: 0 % (ref 0–1)
Eosinophils Absolute: 0 10*3/uL (ref 0.0–0.7)
Eosinophils Relative: 0 % (ref 0–5)
HCT: 41.1 % (ref 36.0–46.0)
Hemoglobin: 13.7 g/dL (ref 12.0–15.0)
Lymphocytes Relative: 14 % (ref 12–46)
Lymphs Abs: 1.3 10*3/uL (ref 0.7–4.0)
MCH: 26.4 pg (ref 26.0–34.0)
MCHC: 33.3 g/dL (ref 30.0–36.0)
MCV: 79.3 fL (ref 78.0–100.0)
Monocytes Absolute: 0.7 10*3/uL (ref 0.1–1.0)
Monocytes Relative: 7 % (ref 3–12)
Neutro Abs: 7.2 10*3/uL (ref 1.7–7.7)
Neutrophils Relative %: 79 % — ABNORMAL HIGH (ref 43–77)
Platelets: 300 10*3/uL (ref 150–400)
RBC: 5.18 MIL/uL — ABNORMAL HIGH (ref 3.87–5.11)
RDW: 14.9 % (ref 11.5–15.5)
WBC: 9.2 10*3/uL (ref 4.0–10.5)

## 2011-09-01 LAB — POCT I-STAT TROPONIN I: Troponin i, poc: 0 ng/mL (ref 0.00–0.08)

## 2011-09-01 LAB — LIPASE, BLOOD: Lipase: 12 U/L (ref 11–59)

## 2011-09-01 MED ORDER — SODIUM CHLORIDE 0.9 % IV BOLUS (SEPSIS)
1000.0000 mL | Freq: Once | INTRAVENOUS | Status: AC
  Administered 2011-09-01: 1000 mL via INTRAVENOUS

## 2011-09-01 MED ORDER — ONDANSETRON 8 MG PO TBDP: 8.0000 mg | ORAL_TABLET | Freq: Three times a day (TID) | ORAL | Status: AC | PRN

## 2011-09-01 MED ORDER — MORPHINE SULFATE 4 MG/ML IJ SOLN
4.0000 mg | Freq: Once | INTRAMUSCULAR | Status: AC
  Administered 2011-09-01: 4 mg via INTRAVENOUS
  Filled 2011-09-01: qty 1

## 2011-09-01 MED ORDER — PROMETHAZINE HCL 25 MG RE SUPP: 25.0000 mg | Freq: Four times a day (QID) | RECTAL | Status: DC | PRN

## 2011-09-01 MED ORDER — FAMOTIDINE IN NACL 20-0.9 MG/50ML-% IV SOLN
20.0000 mg | Freq: Once | INTRAVENOUS | Status: AC
  Administered 2011-09-01: 20 mg via INTRAVENOUS
  Filled 2011-09-01: qty 50

## 2011-09-01 MED ORDER — SODIUM CHLORIDE 0.9 % IV SOLN
INTRAVENOUS | Status: DC
  Administered 2011-09-01: 13:00:00 via INTRAVENOUS

## 2011-09-01 MED ORDER — ONDANSETRON HCL 4 MG/2ML IJ SOLN
4.0000 mg | INTRAMUSCULAR | Status: DC | PRN
  Administered 2011-09-01 (×2): 4 mg via INTRAVENOUS
  Filled 2011-09-01 (×2): qty 2

## 2011-09-01 NOTE — Discharge Instructions (Signed)
Take the miralax,one dose or 17 g in 8 ounces of water every 30 minutes for 2-3 hours until you get good results and then once daily to prevent constipation. Use duclolax suppositories (OTC) at night the next 3 nights to soften the stool.  Use the nausea medication as needed. Recheck if you get a fever, or have uncontrolled vomiting or pain.

## 2011-09-01 NOTE — ED Notes (Signed)
Feels abd is swollen, nausea, no vomiting,  No diarrhea,  Alert,

## 2011-09-01 NOTE — ED Notes (Signed)
Pt waiting for discharge papers

## 2011-09-01 NOTE — ED Provider Notes (Signed)
History   This chart was scribed for Janice Norrie, MD by Kathreen Cornfield. The patient was seen in room APA18/APA18 and the patient's care was started at 1:00 PM.     CSN: [id number]  Arrival date & time 09/01/11  1232   First MD Initiated Contact with Patient 09/01/11 1257      Chief Complaint  Patient presents with  . Abdominal Pain    (Consider location/radiation/quality/duration/timing/severity/associated sxs/prior treatment) HPI  Holly Baldwin is a 66 y.o. female who presents to the Emergency Department complaining of abdominal pain located at the epigastric area onset yesterday with associated symptoms of sweats, nausea, radiating back pain.  The pt informs the EDP that she had not slept all night, two nights ago. The pt feels as if she wants to throw up but nothing will come out.  The pt had a normal bowel movement today. Modifying factors include drinking water which does not provide relief. Pt has a hx of cholecystectomy, abdominal hysterectomy, diabetes (onset 15 years ago). She describes her abdomen as being tight. She has never felt this way before. Nothing makes the pain worse, nothing makes it feel better, states her underwear feel tight and uncomfortable.   Pt denies vomiting, diarrhea, fever  PCP is [facility] at [facility].    Past Medical History  Diagnosis Date  . Hypertension   . Diabetes mellitus   . Hypercholesteremia   . Anxiety     Past Surgical History  Procedure Date  . Abdominal hysterectomy     History reviewed. No pertinent family history.  History  Substance Use Topics  . Smoking status: Never Smoker   . Smokeless tobacco: Not on file  . Alcohol Use: No   Pt does not drink.  Pt does not smoke.  Lives with spouse   OB History    Grav Para Term Preterm Abortions TAB SAB Ect Mult Living                  Review of Systems  All other systems reviewed and are negative.   10 Systems reviewed and all are negative for acute change except as noted  in the HPI.   Allergies  Bee venom and Penicillins  Home Medications   Current Outpatient Rx  Name Route Sig Dispense Refill  . AMLODIPINE-OLMESARTAN 10-40 MG PO TABS Oral Take 1 tablet by mouth every morning.     Marland Kitchen CLONIDINE HCL 0.2 MG PO TABS Oral Take 0.2 mg by mouth 2 (two) times daily.      Marland Kitchen EPINEPHRINE 0.3 MG/0.3ML IJ DEVI Intramuscular Inject 0.3 mg into the muscle once.    Marland Kitchen FLUOXETINE HCL 20 MG PO TABS Oral Take 20 mg by mouth every morning.    . FUROSEMIDE 20 MG PO TABS Oral Take 20 mg by mouth every morning.    . INSULIN ASPART 100 UNIT/ML [place] SOLN Subcutaneous Inject 15 Units into the skin 3 (three) times daily before meals.      . INSULIN GLARGINE 100 UNIT/ML [place] SOLN Subcutaneous Inject 60 Units into the skin at bedtime.     Marland Kitchen PROMETHAZINE HCL 25 MG PO TABS Oral Take 25 mg by mouth every 6 (six) hours as needed.    Marland Kitchen SIMVASTATIN 20 MG PO TABS Oral Take 20 mg by mouth every evening.    Marland Kitchen ZIPRASIDONE HCL 80 MG PO CAPS Oral Take 80 mg by mouth at bedtime.      Marland Kitchen ZOLPIDEM TARTRATE 5 MG PO TABS Oral  Take 5 mg by mouth at bedtime as needed. Sleep       BP 200/57  Pulse 57  Temp 99.2 F (37.3 C) (Oral)  Resp 20  Ht 5\' 4"  (1.626 m)  Wt 270 lb (122.471 kg)  BMI 46.35 kg/m2  SpO2 99%  Vital signs normal except hypertension   Physical Exam  Nursing note and vitals reviewed. Constitutional: She is oriented to person, place, and time. She appears well-developed and well-nourished.  Non-toxic appearance. She does not appear ill. No distress.  HENT:  Head: Normocephalic and atraumatic.  Right Ear: External ear normal.  Left Ear: External ear normal.  Nose: Nose normal. No mucosal edema or rhinorrhea.  Mouth/Throat: Oropharynx is clear and moist and mucous membranes are normal. No dental abscesses or uvula swelling.       Tongue dry.   Eyes: Conjunctivae and EOM are normal. Pupils are equal, round, and reactive to light.  Neck: Normal range of motion and full passive range  of motion without pain. Neck supple.  Cardiovascular: Normal rate, regular rhythm and normal heart sounds.  Exam reveals no gallop and no friction rub.   No murmur heard.      Skipped beat noted.   Pulmonary/Chest: Effort normal and breath sounds normal. No respiratory distress. She has no wheezes. She has no rhonchi. She has no rales. She exhibits no tenderness and no crepitus.  Abdominal: Soft. Normal appearance and bowel sounds are normal. She exhibits distension. There is tenderness (Diffuse, but seems worse in epigastic. ). There is no rebound and no guarding.       Decreased bowel sounds. Cholecystectomy scar present.   Musculoskeletal: Normal range of motion. She exhibits no edema and no tenderness.       Moves all extremities well.   Neurological: She is alert and oriented to person, place, and time. She has normal strength. No cranial nerve deficit.  Skin: Skin is warm, dry and intact. No rash noted. No erythema. No pallor.  Psychiatric: Her speech is normal and behavior is normal. Her mood appears not anxious.       Flat affect but seems anxious    ED Course  Procedures (including critical care time)   Medications  0.9 %  sodium chloride infusion (  Intravenous New Bag/Given 09/01/11 1320)  ondansetron (ZOFRAN) injection 4 mg (4 mg Intravenous Given 09/01/11 1430)  sodium chloride 0.9 % bolus 1,000 mL (1000 mL Intravenous Given 09/01/11 1320)  famotidine (PEPCID) IVPB 20 mg (20 mg Intravenous Given 09/01/11 1320)  morphine 4 MG/ML injection 4 mg (4 mg Intravenous Given 09/01/11 1320)   Pt states she only has a BM every other day.  DIAGNOSTIC STUDIES: Oxygen Saturation is 99% on room air, normal by my interpretation.    COORDINATION OF CARE:   1:08PM- EDP at bedside discusses treatment plan concerning x-ray, blood work, nausea and pain management.    Results for orders placed during the hospital encounter of 09/01/11  CBC WITH DIFFERENTIAL      Component Value Range   WBC 9.2   4.0 - 10.5 K/uL   RBC 5.18 (*) 3.87 - 5.11 MIL/uL   Hemoglobin 13.7  12.0 - 15.0 g/dL   HCT 41.1  36.0 - 46.0 %   MCV 79.3  78.0 - 100.0 fL   MCH 26.4  26.0 - 34.0 pg   MCHC 33.3  30.0 - 36.0 g/dL   RDW 14.9  11.5 - 15.5 %   Platelets 300  150 -  400 K/uL   Neutrophils Relative 79 (*) 43 - 77 %   Neutro Abs 7.2  1.7 - 7.7 K/uL   Lymphocytes Relative 14  12 - 46 %   Lymphs Abs 1.3  0.7 - 4.0 K/uL   Monocytes Relative 7  3 - 12 %   Monocytes Absolute 0.7  0.1 - 1.0 K/uL   Eosinophils Relative 0  0 - 5 %   Eosinophils Absolute 0.0  0.0 - 0.7 K/uL   Basophils Relative 0  0 - 1 %   Basophils Absolute 0.0  0.0 - 0.1 K/uL  COMPREHENSIVE METABOLIC PANEL      Component Value Range   Sodium 136  135 - 145 mEq/L   Potassium 3.4 (*) 3.5 - 5.1 mEq/L   Chloride 98  96 - 112 mEq/L   CO2 23  19 - 32 mEq/L   Glucose, Bld 378 (*) 70 - 99 mg/dL   BUN 10  6 - 23 mg/dL   Creatinine, Ser 0.54  0.50 - 1.10 mg/dL   Calcium 9.6  8.4 - 10.5 mg/dL   Total Protein 7.2  6.0 - 8.3 g/dL   Albumin 3.9  3.5 - 5.2 g/dL   AST 23  0 - 37 U/L   ALT 30  0 - 35 U/L   Alkaline Phosphatase 159 (*) 39 - 117 U/L   Total Bilirubin 0.2 (*) 0.3 - 1.2 mg/dL   GFR calc non Af Amer >90  >90 mL/min   GFR calc Af Amer >90  >90 mL/min  LIPASE, BLOOD      Component Value Range   Lipase 12  11 - 59 U/L  URINALYSIS, ROUTINE W REFLEX MICROSCOPIC      Component Value Range   Color, Urine YELLOW  YELLOW   APPearance CLEAR  CLEAR   Specific Gravity, Urine 1.010  1.005 - 1.030   pH 6.0  5.0 - 8.0   Glucose, UA >1000 (*) NEGATIVE mg/dL   Hgb urine dipstick SMALL (*) NEGATIVE   Bilirubin Urine NEGATIVE  NEGATIVE   Ketones, ur 15 (*) NEGATIVE mg/dL   Protein, ur 100 (*) NEGATIVE mg/dL   Urobilinogen, UA 0.2  0.0 - 1.0 mg/dL   Nitrite NEGATIVE  NEGATIVE   Leukocytes, UA NEGATIVE  NEGATIVE  POCT I-STAT TROPONIN I      Component Value Range   Troponin i, poc 0.00  0.00 - 0.08 ng/mL   Comment 3           URINE MICROSCOPIC-ADD  ON      Component Value Range   Squamous Epithelial / LPF FEW (*) RARE   WBC, UA 0-2  <3 WBC/hpf   RBC / HPF 3-6  <3 RBC/hpf   Laboratory interpretation all normal except mild hypokalemia   Dg Abd Acute W/chest  09/01/2011  *RADIOLOGY REPORT*  Clinical Data: Abdominal swelling with epigastric pain and nausea.  ACUTE ABDOMEN SERIES (ABDOMEN 2 VIEW & CHEST 1 VIEW)  Comparison: None.  Findings: Frontal view of the chest shows midline trachea.  Heart is mildly enlarged.  Thoracic aorta is tortuous.  Pulmonary arteries may be enlarged.  Lungs are clear.  No pleural fluid.  Two views the abdomen shows stool throughout the colon.  Surgical clips in the right upper quadrant.  No unexpected radiopaque calculi.  IMPRESSION: Constipation.  Original Report Authenticated By: Luretha Rued, M.D.       Date: 09/01/2011  Rate: 108  Rhythm: sinus tachycardia  QRS Axis:  normal  Intervals: normal  ST/T Wave abnormalities: nonspecific ST changes  Conduction Disutrbances:LVH  Narrative Interpretation:   Old EKG Reviewed: unchanged from 01/17/2011     1. Abdominal pain   2. Constipation   3. Nausea     New Prescriptions   ONDANSETRON (ZOFRAN ODT) 8 MG DISINTEGRATING TABLET    Take 1 tablet (8 mg total) by mouth every 8 (eight) hours as needed for nausea.   PROMETHAZINE (PHENERGAN) 25 MG SUPPOSITORY    Place 1 suppository (25 mg total) rectally every 6 (six) hours as needed for nausea.  Miralax   Plan discharge   Rolland Porter, MD, FACEP   MDM   I personally performed the services described in this documentation, which was scribed in my presence. The recorded information has been reviewed and considered.  Rolland Porter, MD, Abram Sander    Janice Norrie, MD 09/01/11 1630

## 2011-09-01 NOTE — ED Notes (Signed)
Helped pt to toilet.  Pt walked to toilet with minimal assistance.

## 2012-05-05 ENCOUNTER — Encounter (HOSPITAL_COMMUNITY): Payer: Self-pay

## 2012-05-05 ENCOUNTER — Observation Stay (HOSPITAL_COMMUNITY)
Admission: EM | Admit: 2012-05-05 | Discharge: 2012-05-06 | Disposition: A | Discharge status: Home / Self Care | Payer: Medicare Other | Attending: Internal Medicine | Admitting: Internal Medicine

## 2012-05-05 ENCOUNTER — Emergency Department (HOSPITAL_COMMUNITY): Payer: Medicare Other

## 2012-05-05 ENCOUNTER — Other Ambulatory Visit: Payer: Self-pay

## 2012-05-05 DIAGNOSIS — I10 Essential (primary) hypertension: Secondary | ICD-10-CM | POA: Diagnosis present

## 2012-05-05 DIAGNOSIS — K2990 Gastroduodenitis, unspecified, without bleeding: Secondary | ICD-10-CM | POA: Diagnosis present

## 2012-05-05 DIAGNOSIS — K228 Other specified diseases of esophagus: Secondary | ICD-10-CM | POA: Insufficient documentation

## 2012-05-05 DIAGNOSIS — R1013 Epigastric pain: Secondary | ICD-10-CM | POA: Insufficient documentation

## 2012-05-05 DIAGNOSIS — K219 Gastro-esophageal reflux disease without esophagitis: Secondary | ICD-10-CM | POA: Diagnosis present

## 2012-05-05 DIAGNOSIS — E119 Type 2 diabetes mellitus without complications: Secondary | ICD-10-CM | POA: Diagnosis present

## 2012-05-05 DIAGNOSIS — Z794 Long term (current) use of insulin: Secondary | ICD-10-CM | POA: Diagnosis present

## 2012-05-05 DIAGNOSIS — K2970 Gastritis, unspecified, without bleeding: Secondary | ICD-10-CM | POA: Diagnosis present

## 2012-05-05 DIAGNOSIS — F329 Major depressive disorder, single episode, unspecified: Secondary | ICD-10-CM | POA: Diagnosis present

## 2012-05-05 DIAGNOSIS — R109 Unspecified abdominal pain: Secondary | ICD-10-CM | POA: Diagnosis present

## 2012-05-05 DIAGNOSIS — E7800 Pure hypercholesterolemia, unspecified: Secondary | ICD-10-CM | POA: Insufficient documentation

## 2012-05-05 DIAGNOSIS — F32A Depression, unspecified: Secondary | ICD-10-CM | POA: Diagnosis present

## 2012-05-05 DIAGNOSIS — R739 Hyperglycemia, unspecified: Secondary | ICD-10-CM

## 2012-05-05 DIAGNOSIS — K2289 Other specified disease of esophagus: Principal | ICD-10-CM | POA: Insufficient documentation

## 2012-05-05 DIAGNOSIS — K449 Diaphragmatic hernia without obstruction or gangrene: Secondary | ICD-10-CM | POA: Diagnosis present

## 2012-05-05 DIAGNOSIS — E039 Hypothyroidism, unspecified: Secondary | ICD-10-CM | POA: Diagnosis present

## 2012-05-05 DIAGNOSIS — E1165 Type 2 diabetes mellitus with hyperglycemia: Secondary | ICD-10-CM | POA: Diagnosis present

## 2012-05-05 DIAGNOSIS — Z01810 Encounter for preprocedural cardiovascular examination: Secondary | ICD-10-CM | POA: Insufficient documentation

## 2012-05-05 DIAGNOSIS — Z8679 Personal history of other diseases of the circulatory system: Secondary | ICD-10-CM

## 2012-05-05 DIAGNOSIS — Z01812 Encounter for preprocedural laboratory examination: Secondary | ICD-10-CM | POA: Insufficient documentation

## 2012-05-05 DIAGNOSIS — M25819 Other specified joint disorders, unspecified shoulder: Secondary | ICD-10-CM

## 2012-05-05 DIAGNOSIS — R112 Nausea with vomiting, unspecified: Secondary | ICD-10-CM | POA: Diagnosis present

## 2012-05-05 HISTORY — DX: Gastro-esophageal reflux disease without esophagitis: K21.9

## 2012-05-05 HISTORY — DX: Diaphragmatic hernia without obstruction or gangrene: K44.9

## 2012-05-05 LAB — CBC WITH DIFFERENTIAL/PLATELET
Basophils Absolute: 0 10*3/uL (ref 0.0–0.1)
Basophils Relative: 1 % (ref 0–1)
Eosinophils Absolute: 0 10*3/uL (ref 0.0–0.7)
Eosinophils Relative: 0 % (ref 0–5)
HCT: 40.6 % (ref 36.0–46.0)
Hemoglobin: 13.8 g/dL (ref 12.0–15.0)
Lymphocytes Relative: 13 % (ref 12–46)
Lymphs Abs: 1.1 10*3/uL (ref 0.7–4.0)
MCH: 26.4 pg (ref 26.0–34.0)
MCHC: 34 g/dL (ref 30.0–36.0)
MCV: 77.8 fL — ABNORMAL LOW (ref 78.0–100.0)
Monocytes Absolute: 0.6 10*3/uL (ref 0.1–1.0)
Monocytes Relative: 7 % (ref 3–12)
Neutro Abs: 6.9 10*3/uL (ref 1.7–7.7)
Neutrophils Relative %: 80 % — ABNORMAL HIGH (ref 43–77)
Platelets: 287 10*3/uL (ref 150–400)
RBC: 5.22 MIL/uL — ABNORMAL HIGH (ref 3.87–5.11)
RDW: 14.9 % (ref 11.5–15.5)
WBC: 8.6 10*3/uL (ref 4.0–10.5)

## 2012-05-05 LAB — COMPREHENSIVE METABOLIC PANEL
ALT: 27 U/L (ref 0–35)
AST: 23 U/L (ref 0–37)
Albumin: 3.8 g/dL (ref 3.5–5.2)
Alkaline Phosphatase: 161 U/L — ABNORMAL HIGH (ref 39–117)
BUN: 8 mg/dL (ref 6–23)
CO2: 26 mEq/L (ref 19–32)
Calcium: 9.4 mg/dL (ref 8.4–10.5)
Chloride: 102 mEq/L (ref 96–112)
Creatinine, Ser: 0.58 mg/dL (ref 0.50–1.10)
GFR calc Af Amer: >90 mL/min (ref >90)
GFR calc non Af Amer: >90 mL/min (ref >90)
Glucose, Bld: 356 mg/dL — ABNORMAL HIGH (ref 70–99)
Potassium: 3.8 mEq/L (ref 3.5–5.1)
Sodium: 141 mEq/L (ref 135–145)
Total Bilirubin: 0.2 mg/dL — ABNORMAL LOW (ref 0.3–1.2)
Total Protein: 7.3 g/dL (ref 6.0–8.3)

## 2012-05-05 LAB — URINALYSIS, ROUTINE W REFLEX MICROSCOPIC
Bilirubin Urine: NEGATIVE
Glucose, UA: >1000 mg/dL — AB
Leukocytes, UA: NEGATIVE
Nitrite: NEGATIVE
Protein, ur: 100 mg/dL — AB
Specific Gravity, Urine: 1.02 (ref 1.005–1.030)
Urobilinogen, UA: 0.2 mg/dL (ref 0.0–1.0)
pH: 6.5 (ref 5.0–8.0)

## 2012-05-05 LAB — GLUCOSE, CAPILLARY
Glucose-Capillary: 388 mg/dL — ABNORMAL HIGH (ref 70–99)
Glucose-Capillary: 281 mg/dL — ABNORMAL HIGH (ref 70–99)
Glucose-Capillary: 261 mg/dL — ABNORMAL HIGH (ref 70–99)
Glucose-Capillary: 192 mg/dL — ABNORMAL HIGH (ref 70–99)

## 2012-05-05 LAB — LIPASE, BLOOD: Lipase: 12 U/L (ref 11–59)

## 2012-05-05 LAB — URINE MICROSCOPIC-ADD ON

## 2012-05-05 MED ORDER — PROMETHAZINE HCL 25 MG/ML IJ SOLN
12.5000 mg | Freq: Once | INTRAMUSCULAR | Status: AC
  Administered 2012-05-05: 12.5 mg via INTRAVENOUS
  Filled 2012-05-05: qty 1

## 2012-05-05 MED ORDER — INSULIN GLARGINE 100 UNIT/ML ~~LOC~~ SOLN: 70.0000 [IU] | Freq: Every day | SUBCUTANEOUS | Status: DC

## 2012-05-05 MED ORDER — HYDROMORPHONE HCL PF 1 MG/ML IJ SOLN
0.5000 mg | INTRAMUSCULAR | Status: DC | PRN
  Administered 2012-05-05 – 2012-05-06 (×3): 0.5 mg via INTRAVENOUS
  Filled 2012-05-05 (×3): qty 1

## 2012-05-05 MED ORDER — DIPHENHYDRAMINE HCL 25 MG PO CAPS
25.0000 mg | ORAL_CAPSULE | ORAL | Status: DC | PRN
  Administered 2012-05-05: 25 mg via ORAL
  Filled 2012-05-05: qty 1

## 2012-05-05 MED ORDER — SODIUM CHLORIDE 0.9 % IV BOLUS (SEPSIS)
1000.0000 mL | Freq: Once | INTRAVENOUS | Status: AC
  Administered 2012-05-05: 1000 mL via INTRAVENOUS

## 2012-05-05 MED ORDER — ALUM & MAG HYDROXIDE-SIMETH 200-200-20 MG/5ML PO SUSP: 30.0000 mL | Freq: Four times a day (QID) | ORAL | Status: DC | PRN

## 2012-05-05 MED ORDER — FLUOXETINE HCL 20 MG PO CAPS
20.0000 mg | ORAL_CAPSULE | Freq: Every day | ORAL | Status: DC
  Administered 2012-05-06: 20 mg via ORAL
  Filled 2012-05-05: qty 1

## 2012-05-05 MED ORDER — ONDANSETRON HCL 4 MG/2ML IJ SOLN
4.0000 mg | Freq: Four times a day (QID) | INTRAMUSCULAR | Status: DC | PRN
  Administered 2012-05-05: 4 mg via INTRAVENOUS
  Filled 2012-05-05: qty 2

## 2012-05-05 MED ORDER — LEVOTHYROXINE SODIUM 25 MCG PO TABS
25.0000 ug | ORAL_TABLET | Freq: Every day | ORAL | Status: DC
  Administered 2012-05-06 (×2): 25 ug via ORAL
  Filled 2012-05-05 (×2): qty 1

## 2012-05-05 MED ORDER — IRBESARTAN 300 MG PO TABS
300.0000 mg | ORAL_TABLET | Freq: Every day | ORAL | Status: DC
  Administered 2012-05-06: 300 mg via ORAL
  Filled 2012-05-05: qty 1

## 2012-05-05 MED ORDER — PROMETHAZINE HCL 25 MG/ML IJ SOLN: 12.5000 mg | Freq: Four times a day (QID) | INTRAMUSCULAR | Status: DC | PRN

## 2012-05-05 MED ORDER — IOHEXOL 300 MG/ML  SOLN
100.0000 mL | Freq: Once | INTRAMUSCULAR | Status: AC | PRN
  Administered 2012-05-05: 100 mL via INTRAVENOUS

## 2012-05-05 MED ORDER — AMLODIPINE-OLMESARTAN 10-40 MG PO TABS: 1.0000 | ORAL_TABLET | Freq: Every morning | ORAL | Status: DC

## 2012-05-05 MED ORDER — IOHEXOL 300 MG/ML  SOLN
50.0000 mL | Freq: Once | INTRAMUSCULAR | Status: AC | PRN
  Administered 2012-05-05: 50 mL via ORAL

## 2012-05-05 MED ORDER — INSULIN ASPART 100 UNIT/ML ~~LOC~~ SOLN
22.0000 [IU] | Freq: Three times a day (TID) | SUBCUTANEOUS | Status: DC
  Administered 2012-05-05: 22 [IU] via SUBCUTANEOUS

## 2012-05-05 MED ORDER — HYDROMORPHONE HCL PF 1 MG/ML IJ SOLN
1.0000 mg | Freq: Once | INTRAMUSCULAR | Status: AC
  Administered 2012-05-05: 1 mg via INTRAVENOUS
  Filled 2012-05-05: qty 1

## 2012-05-05 MED ORDER — DOCUSATE SODIUM 100 MG PO CAPS
100.0000 mg | ORAL_CAPSULE | Freq: Two times a day (BID) | ORAL | Status: DC
  Administered 2012-05-05 – 2012-05-06 (×2): 100 mg via ORAL
  Filled 2012-05-05 (×2): qty 1

## 2012-05-05 MED ORDER — PANTOPRAZOLE SODIUM 40 MG PO TBEC
40.0000 mg | DELAYED_RELEASE_TABLET | Freq: Two times a day (BID) | ORAL | Status: DC
  Administered 2012-05-05 – 2012-05-06 (×3): 40 mg via ORAL
  Filled 2012-05-05 (×3): qty 1

## 2012-05-05 MED ORDER — ZIPRASIDONE HCL 80 MG PO CAPS
80.0000 mg | ORAL_CAPSULE | Freq: Every day | ORAL | Status: DC
  Administered 2012-05-05: 80 mg via ORAL
  Filled 2012-05-05 (×2): qty 1

## 2012-05-05 MED ORDER — POTASSIUM CHLORIDE IN NACL 20-0.9 MEQ/L-% IV SOLN
INTRAVENOUS | Status: DC
  Administered 2012-05-05 – 2012-05-06 (×2): via INTRAVENOUS

## 2012-05-05 MED ORDER — ONDANSETRON HCL 4 MG PO TABS: 4.0000 mg | ORAL_TABLET | Freq: Four times a day (QID) | ORAL | Status: DC | PRN

## 2012-05-05 MED ORDER — AMLODIPINE BESYLATE 5 MG PO TABS
10.0000 mg | ORAL_TABLET | Freq: Every day | ORAL | Status: DC
  Administered 2012-05-06: 10 mg via ORAL
  Filled 2012-05-05: qty 2

## 2012-05-05 MED ORDER — ONDANSETRON HCL 4 MG/2ML IJ SOLN
4.0000 mg | Freq: Once | INTRAMUSCULAR | Status: AC
  Administered 2012-05-05: 4 mg via INTRAVENOUS
  Filled 2012-05-05: qty 2

## 2012-05-05 MED ORDER — ZOLPIDEM TARTRATE 5 MG PO TABS
5.0000 mg | ORAL_TABLET | Freq: Every evening | ORAL | Status: DC | PRN
  Administered 2012-05-05: 5 mg via ORAL
  Filled 2012-05-05: qty 1

## 2012-05-05 MED ORDER — CLONIDINE HCL 0.2 MG PO TABS
0.2000 mg | ORAL_TABLET | Freq: Two times a day (BID) | ORAL | Status: DC
  Administered 2012-05-05 – 2012-05-06 (×2): 0.2 mg via ORAL
  Filled 2012-05-05 (×2): qty 1

## 2012-05-05 NOTE — ED Notes (Signed)
Patient completed her contrast. CT aware.

## 2012-05-05 NOTE — ED Provider Notes (Signed)
History  This chart was scribed for Holly Pollack, MD by Holly Baldwin, ED Scribe. This patient was seen in room APA14/APA14 and the patient's care was started at 11:26 AM.  CSN: [id number]  Arrival date & time 05/05/12  1052   First MD Initiated Contact with Patient 05/05/12 1126      Chief Complaint  Patient presents with  . Abdominal Pain  . Diarrhea    Patient is a 67 y.o. female presenting with abdominal pain. The history is provided by the patient. No language interpreter was used.  Abdominal Pain Pain location:  Epigastric, LUQ and RUQ Pain quality: cramping   Pain radiates to:  Does not radiate Onset quality:  Gradual Duration:  2 weeks Timing:  Intermittent Progression:  Worsening Chronicity:  New Relieved by:  Nothing Worsened by:  Nothing tried Ineffective treatments:  OTC medications Associated symptoms: nausea   Associated symptoms: no chest pain, no chills, no cough, no diarrhea, no dysuria, no fever, no hematuria, no shortness of breath and no vomiting     Holly Baldwin is a 67 y.o. female who presents to the Emergency Department complaining of 2 weeks of gradual onset, intermittently felt, gradually worsening upper abdominal pain described as cramping with associated swelling and nausea that got worse last night. She reports that she took [name] for the pain with no improvements. She was seen by her PCP last week for the same but denies having any blood work or imaging performed. She states that she has an appointment on March 13th, 2014 with Holly Baldwin (referred by Holly Baldwin) but is unsure why.  She reports that she has been eating and drinking normally and urinating with a normal amount of BMs since the onset. She reports that her last BM was this morning. She denies emesis, diarrhea, CP, SOB and fevers as associated symptoms. She has a h/o HTN, DM and HLD. She denies smoking and alcohol use.  Past Medical History  Diagnosis Date  . Hypertension   .  Diabetes mellitus   . Hypercholesteremia   . Anxiety     Past Surgical History  Procedure Laterality Date  . Abdominal hysterectomy      No family history on file.  History  Substance Use Topics  . Smoking status: Never Smoker   . Smokeless tobacco: Not on file  . Alcohol Use: No    No OB history provided.  Review of Systems  Constitutional: Negative for fever and chills.  Respiratory: Negative for cough and shortness of breath.   Cardiovascular: Negative for chest pain.  Gastrointestinal: Positive for nausea, abdominal pain and abdominal distention. Negative for vomiting and diarrhea.  Genitourinary: Negative for dysuria, frequency and hematuria.  All other systems reviewed and are negative.    Allergies  Bee venom and Penicillins  Home Medications   Current Outpatient Rx  Name  Route  Sig  Dispense  Refill  . amLODipine-olmesartan (AZOR) 10-40 MG per tablet   Oral   Take 1 tablet by mouth every morning.          . cloNIDine (CATAPRES) 0.2 MG tablet   Oral   Take 0.2 mg by mouth 2 (two) times daily.           Marland Kitchen EPINEPHrine (EPI-PEN) 0.3 mg/0.3 mL DEVI   Intramuscular   Inject 0.3 mg into the muscle once.         Marland Kitchen FLUoxetine (PROZAC) 20 MG tablet   Oral   Take 20 mg  by mouth every morning.         . furosemide (LASIX) 20 MG tablet   Oral   Take 20 mg by mouth every morning.         . insulin aspart (NOVOLOG FLEXPEN) 100 UNIT/ML injection   Subcutaneous   Inject 15 Units into the skin 3 (three) times daily before meals.           . insulin glargine (LANTUS) 100 UNIT/ML injection   Subcutaneous   Inject 60 Units into the skin at bedtime.          Marland Kitchen EXPIRED: promethazine (PHENERGAN) 25 MG suppository   Rectal   Place 1 suppository (25 mg total) rectally every 6 (six) hours as needed for nausea.   6 each   0   . promethazine (PHENERGAN) 25 MG tablet   Oral   Take 25 mg by mouth every 6 (six) hours as needed.         .  simvastatin (ZOCOR) 20 MG tablet   Oral   Take 20 mg by mouth every evening.         . ziprasidone (GEODON) 80 MG capsule   Oral   Take 80 mg by mouth at bedtime.           Marland Kitchen zolpidem (AMBIEN) 5 MG tablet   Oral   Take 5 mg by mouth at bedtime as needed. Sleep            Triage Vitals: BP 182/89  Pulse 108  Temp(Src) 99.7 F (37.6 C) (Oral)  Resp 18  Ht 5\' 4"  (1.626 m)  Wt 227 lb (102.967 kg)  BMI 38.95 kg/m2  SpO2 98%  Physical Exam  Nursing note and vitals reviewed. Constitutional: She is oriented to person, place, and time. She appears well-developed and well-nourished. No distress.  HENT:  Head: Normocephalic and atraumatic.  Mouth/Throat: Oropharynx is clear and moist.  Eyes: Conjunctivae and EOM are normal. Pupils are equal, round, and reactive to light.  Neck: Normal range of motion. Neck supple. No tracheal deviation present.  Cardiovascular: Normal rate and regular rhythm.  Exam reveals no gallop and no friction rub.   No murmur heard. Pulmonary/Chest: Effort normal and breath sounds normal. No respiratory distress. She has no wheezes. She has no rales.  Abdominal: Soft. She exhibits distension. There is tenderness.  Swelling and tenderness in the upper abdomen  Musculoskeletal: Normal range of motion. She exhibits no edema.  Neurological: She is alert and oriented to person, place, and time.  Skin: Skin is warm and dry.  Psychiatric: She has a normal mood and affect. Her behavior is normal.    ED Course  Procedures (including critical care time)  DIAGNOSTIC STUDIES: Oxygen Saturation is 98% on room air, normal by my interpretation.    COORDINATION OF CARE: 11:49 AM-Discussed treatment plan which includes UA with pt at bedside and pt agreed to plan.   Labs Reviewed  GLUCOSE, CAPILLARY - Abnormal; Notable for the following:    Glucose-Capillary 388 (*)    All other components within normal limits  URINALYSIS, ROUTINE W REFLEX MICROSCOPIC   No  results found.   No diagnosis found.  I personally performed the services described in this documentation, which was scribed in my presence. The recorded information has been reviewed and considered.   MDM  Ct Abdomen Pelvis W Contrast  05/05/2012  *RADIOLOGY REPORT*  Clinical Data: Abdominal pain for 2 weeks, nausea, fever, history hypertension, asthma, diabetes, hysterectomy  CT ABDOMEN AND PELVIS WITH CONTRAST  Technique:  Multidetector CT imaging of the abdomen and pelvis was performed following the standard protocol during bolus administration of intravenous contrast.  Contrast: 35mL OMNIPAQUE IOHEXOL 300 MG/ML  SOLN p.o., 131mL OMNIPAQUE IOHEXOL 300 MG/ML  SOLN IV  Comparison: 01/17/2011  Findings: Lung bases clear. Gallbladder surgically absent. Diffuse fatty infiltration of liver. Low attenuation focus posterior right lobe liver 15 x 10 mm unchanged. Curvilinear calcification mid left kidney unchanged. Tiny bilateral renal cysts. Liver, spleen, pancreas, kidneys, and adrenal glands otherwise normal appearance.  Unremarkable bladder and ureters. Uterus surgically absent with normal sized ovaries. Mildly prominent appendiceal size without wall thickening or inflammatory changes similar to previous exam. Stomach and bowel loops normal appearance. Small umbilical hernia containing fat. Additional small suprapubic ventral hernia containing fat.  No mass, adenopathy, free fluid or inflammatory process. Scattered pelvic phleboliths stable probable splenule. Degenerative disc disease changes L5-S1.  IMPRESSION: Umbilical and suprapubic ventral hernias containing fat. Fatty infiltration of liver. Stable minimal renal changes as above. No acute intra abdominal or intrapelvic process identified.   Original Report Authenticated By: Lavonia Dana, M.D.    Results for orders placed during the hospital encounter of 05/05/12  URINALYSIS, ROUTINE W REFLEX MICROSCOPIC      Result Value Range   Color, Urine YELLOW   YELLOW   APPearance CLEAR  CLEAR   Specific Gravity, Urine 1.020  1.005 - 1.030   pH 6.5  5.0 - 8.0   Glucose, UA >1000 (*) NEGATIVE mg/dL   Hgb urine dipstick SMALL (*) NEGATIVE   Bilirubin Urine NEGATIVE  NEGATIVE   Ketones, ur TRACE (*) NEGATIVE mg/dL   Protein, ur 100 (*) NEGATIVE mg/dL   Urobilinogen, UA 0.2  0.0 - 1.0 mg/dL   Nitrite NEGATIVE  NEGATIVE   Leukocytes, UA NEGATIVE  NEGATIVE  GLUCOSE, CAPILLARY      Result Value Range   Glucose-Capillary 388 (*) 70 - 99 mg/dL  CBC WITH DIFFERENTIAL      Result Value Range   WBC 8.6  4.0 - 10.5 K/uL   RBC 5.22 (*) 3.87 - 5.11 MIL/uL   Hemoglobin 13.8  12.0 - 15.0 g/dL   HCT 40.6  36.0 - 46.0 %   MCV 77.8 (*) 78.0 - 100.0 fL   MCH 26.4  26.0 - 34.0 pg   MCHC 34.0  30.0 - 36.0 g/dL   RDW 14.9  11.5 - 15.5 %   Platelets 287  150 - 400 K/uL   Neutrophils Relative 80 (*) 43 - 77 %   Neutro Abs 6.9  1.7 - 7.7 K/uL   Lymphocytes Relative 13  12 - 46 %   Lymphs Abs 1.1  0.7 - 4.0 K/uL   Monocytes Relative 7  3 - 12 %   Monocytes Absolute 0.6  0.1 - 1.0 K/uL   Eosinophils Relative 0  0 - 5 %   Eosinophils Absolute 0.0  0.0 - 0.7 K/uL   Basophils Relative 1  0 - 1 %   Basophils Absolute 0.0  0.0 - 0.1 K/uL  COMPREHENSIVE METABOLIC PANEL      Result Value Range   Sodium 141  135 - 145 mEq/L   Potassium 3.8  3.5 - 5.1 mEq/L   Chloride 102  96 - 112 mEq/L   CO2 26  19 - 32 mEq/L   Glucose, Bld 356 (*) 70 - 99 mg/dL   BUN 8  6 - 23 mg/dL   Creatinine, Ser  0.58  0.50 - 1.10 mg/dL   Calcium 9.4  8.4 - 10.5 mg/dL   Total Protein 7.3  6.0 - 8.3 g/dL   Albumin 3.8  3.5 - 5.2 g/dL   AST 23  0 - 37 U/L   ALT 27  0 - 35 U/L   Alkaline Phosphatase 161 (*) 39 - 117 U/L   Total Bilirubin 0.2 (*) 0.3 - 1.2 mg/dL   GFR calc non Af Amer >90  >90 mL/min   GFR calc Af Amer >90  >90 mL/min  LIPASE, BLOOD      Result Value Range   Lipase 12  11 - 59 U/L  URINE MICROSCOPIC-ADD ON      Result Value Range   Squamous Epithelial / LPF FEW (*)  RARE   WBC, UA 0-2  <3 WBC/hpf   RBC / HPF 0-2  <3 RBC/hpf   Bacteria, UA RARE  RARE    Date: 05/05/2012  Rate: 89  Rhythm: normal sinus rhythm  QRS Axis: normal  Intervals: normal  ST/T Wave abnormalities: normal   Conduction Disutrbances: none  Narrative Interpretation: unremarkable       Patient's care discussed with Dr. Geroge Baseman. Surgery will see in consult.  67 year old female status post cholecystectomy and hysterectomy presents today complaining of 2 weeks of upper abdominal swelling and pain. She has central hernia seen on CT scan but correlate clinically with where she is tender on physical exam. There is no bowel involved a she's not appear to have a bowel obstruction. He is hyperglycemic and has had 1 L of normal saline and his not appear to be in DKA.  Epigastric pain with abdominal tenderness on exam correlating with pain.  Holly Pollack, MD 05/05/12 [phone]

## 2012-05-05 NOTE — ED Notes (Signed)
CBG Result: 281

## 2012-05-05 NOTE — ED Notes (Signed)
Pt reports upper abd pain x 2 weeks.  Says feels like stomach is swelling and has a headache.  Pt also reports nausea  and 1 episode of diarrhea this morning.

## 2012-05-05 NOTE — ED Notes (Signed)
Report given to Janett Billow, RN unit 300

## 2012-05-05 NOTE — H&P (Addendum)
[facility] History and Physical  RAHIL OGNIBENE [id number] DOB: 1945-07-06 DOA: 05/05/2012  Referring physician:  PCP: Jana Half  Specialists: Rehman  Chief Complaint: abdominal pain  HPI: Holly Baldwin is a 67 y.o. female with pmhx HTN, DM, GERD, hiatal hernia, depression presents to ED with cc abdominal pain. Information obtained from pt. States pain started about 2 weeks ago and has gotten progressively worse. Pain located in upper quadrants of abdomen, is intermittent and described as burning. Associated symptoms include nausea and abdominal distention. She denies decreased po intake, constipation. Denies vomiting, sob, fever chills. States that pain is worse when lying flat and after eating. She took pepto bismol without relief. Reports BM this am. Pt reports that she frequently take excedrin for headaches often several times per day. CT abdomen obtained without obstruction,or acute process. On exam abdomen distended . We are asked to admit for further evaluation   Review of Systems: The patient denies anorexia, fever, weight loss,, vision loss, decreased hearing, hoarseness, chest pain, syncope, dyspnea on exertion, peripheral edema, balance deficits, hemoptysis,  melena, hematochezia,  hematuria, incontinence, genital sores, muscle weakness, suspicious skin lesions, transient blindness, difficulty walking, unusual weight change, abnormal bleeding, enlarged lymph nodes, angioedema, and breast masses.    Past Medical History  Diagnosis Date  . Hypertension   . Diabetes mellitus   . Hypercholesteremia   . Anxiety   . GERD (gastroesophageal reflux disease)   . Hiatal hernia    Past Surgical History  Procedure Laterality Date  . Abdominal hysterectomy     Social History:  reports that she has never smoked. She reports that she does not drink alcohol or use illicit drugs. Lives at home with husband. Independent with ADL's Allergies  Allergen Reactions  . Bee Venom  Anaphylaxis  . Penicillins Hives    No family history on file. Sibling with HTN and DM.  Prior to Admission medications   Medication Sig Start Date End Date Taking? Authorizing Provider  amLODipine-olmesartan (AZOR) 10-40 MG per tablet Take 1 tablet by mouth every morning.    Yes Historical Provider, MD  cloNIDine (CATAPRES) 0.2 MG tablet Take 0.2 mg by mouth 2 (two) times daily.     Yes Historical Provider, MD  FLUoxetine (PROZAC) 20 MG capsule Take 20 mg by mouth daily.   Yes Historical Provider, MD  furosemide (LASIX) 20 MG tablet Take 20 mg by mouth every morning.   Yes Historical Provider, MD  insulin aspart (NOVOLOG FLEXPEN) 100 UNIT/ML injection Inject 22 Units into the skin 3 (three) times daily before meals.    Yes Historical Provider, MD  insulin glargine (LANTUS) 100 UNIT/ML injection Inject 70 Units into the skin at bedtime.    Yes Historical Provider, MD  levothyroxine (SYNTHROID, LEVOTHROID) 25 MCG tablet Take 25 mcg by mouth daily.   Yes Historical Provider, MD  omeprazole (PRILOSEC) 40 MG capsule Take 40 mg by mouth daily.   Yes Historical Provider, MD  simvastatin (ZOCOR) 40 MG tablet Take 40 mg by mouth every evening.   Yes Historical Provider, MD  ziprasidone (GEODON) 80 MG capsule Take 80 mg by mouth at bedtime.     Yes Historical Provider, MD  zolpidem (AMBIEN) 5 MG tablet Take 5 mg by mouth at bedtime as needed. Sleep    Yes Historical Provider, MD  EPINEPHrine (EPI-PEN) 0.3 mg/0.3 mL DEVI Inject 0.3 mg into the muscle once.    Historical Provider, MD  promethazine (PHENERGAN) 25 MG suppository Place 1 suppository (25  mg total) rectally every 6 (six) hours as needed for nausea. 09/01/11 09/08/11  Janice Norrie, MD  promethazine (PHENERGAN) 25 MG tablet Take 25 mg by mouth every 6 (six) hours as needed for nausea.     Historical Provider, MD   Physical Exam: Filed Vitals:   05/05/12 1101 05/05/12 1520  BP: 182/89 193/88  Pulse: 108 92  Temp: 99.7 F (37.6 C)   TempSrc:  Oral   Resp: 18 20  Height: 5\' 4"  (1.626 m)   Weight: 102.967 kg (227 lb)   SpO2: 98% 95%     General:  Awake obese, NAD  Eyes: PERRL EOMI no scleral icterus  ENT: nose without drainage, mucus membrane mouth pink slightly dry  Neck: supple No JVD no lymphadenopathy  Cardiovascular: RRR No MGR No LEE  Respiratory: normal effort BSCTAB  Abdomen: obese, slightly firm, +BS non-tender to palpation  Skin: warm dry no lesion, powder under breasts  Musculoskeletal: MAE no joint swelling/erythema non-tender  Psychiatric: appropriate calm cooperative  Neurologic: cranial nerve II-XII intact. Speech clear  Labs on Admission:  Basic Metabolic Panel:  Recent Labs Lab 05/05/12 1230  NA 141  K 3.8  CL 102  CO2 26  GLUCOSE 356*  BUN 8  CREATININE 0.58  CALCIUM 9.4   Liver Function Tests:  Recent Labs Lab 05/05/12 1230  AST 23  ALT 27  ALKPHOS 161*  BILITOT 0.2*  PROT 7.3  ALBUMIN 3.8    Recent Labs Lab 05/05/12 1230  LIPASE 12   No results found for this basename: AMMONIA,  in the last 168 hours CBC:  Recent Labs Lab 05/05/12 1230  WBC 8.6  NEUTROABS 6.9  HGB 13.8  HCT 40.6  MCV 77.8*  PLT 287   Cardiac Enzymes: No results found for this basename: CKTOTAL, CKMB, CKMBINDEX, TROPONINI,  in the last 168 hours  BNP (last 3 results) No results found for this basename: PROBNP,  in the last 8760 hours CBG:  Recent Labs Lab 05/05/12 1129 05/05/12 1509  GLUCAP 388* 281*    Radiological Exams on Admission: Ct Abdomen Pelvis W Contrast  05/05/2012  *RADIOLOGY REPORT*  Clinical Data: Abdominal pain for 2 weeks, nausea, fever, history hypertension, asthma, diabetes, hysterectomy  CT ABDOMEN AND PELVIS WITH CONTRAST  Technique:  Multidetector CT imaging of the abdomen and pelvis was performed following the standard protocol during bolus administration of intravenous contrast.  Contrast: 66mL OMNIPAQUE IOHEXOL 300 MG/ML  SOLN p.o., 162mL OMNIPAQUE IOHEXOL  300 MG/ML  SOLN IV  Comparison: 01/17/2011  Findings: Lung bases clear. Gallbladder surgically absent. Diffuse fatty infiltration of liver. Low attenuation focus posterior right lobe liver 15 x 10 mm unchanged. Curvilinear calcification mid left kidney unchanged. Tiny bilateral renal cysts. Liver, spleen, pancreas, kidneys, and adrenal glands otherwise normal appearance.  Unremarkable bladder and ureters. Uterus surgically absent with normal sized ovaries. Mildly prominent appendiceal size without wall thickening or inflammatory changes similar to previous exam. Stomach and bowel loops normal appearance. Small umbilical hernia containing fat. Additional small suprapubic ventral hernia containing fat.  No mass, adenopathy, free fluid or inflammatory process. Scattered pelvic phleboliths stable probable splenule. Degenerative disc disease changes L5-S1.  IMPRESSION: Umbilical and suprapubic ventral hernias containing fat. Fatty infiltration of liver. Stable minimal renal changes as above. No acute intra abdominal or intrapelvic process identified.   Original Report Authenticated By: Lavonia Dana, M.D.     EKG: NSR  Assessment/Plan Principal Problem:   Abdominal pain Concern for esophagitis, gastritis in pt  with hx GERD. Could also be gastroparesis. CT neg for acute intra-abdominal process. Will admit for observation. Will request GI consult for EGD. Pt with colonoscopy 2004 yielding polyps and EGD 2003 showing hiatal hernia by Dr. Laural Golden.. Will provide PPI, and pain med. Clear liquid and NPO past midnight. At this point dont believe this is surgical case. Will cancel surgical consult. Reviewed the film with Dr. Darcella Cheshire. Ventral and umbilical hernias are small and only contain fat. No evidence of bowel obstruction Active Problems:   DIABETES2: uncontrolled: pt reports CBG running high during this illness. Will continue home regimen of lantus and monitor closely.     HIGH BLOOD PRESSURE: poor control. Pain  probably contributing. Will continue home meds and monitor.    Nausea & vomiting: see #1. Provide anti-emetic, clear liquids.     Depression: appears at baseline. Continue home meds.    GERD (gastroesophageal reflux disease): see #1.   Hypothyroidism: Check TSH   GI  Code Status: full Family Communication: husband at bedside Disposition Plan: home when ready  Time spent: 75 minutes  [facility]   If 7PM-7AM, please contact night-coverage [email or web address] Password [facility] 05/05/2012, 4:16 PM  Attending note: Patient interviewed and examined with Ms. Renard Hamper. Note amended in bold.  discussed with Dr. Laural Golden who will consult and likely do an EGD tomorrow.  Doree Barthel, M.D.

## 2012-05-05 NOTE — ED Notes (Signed)
Notified nurse - BGL [id number]

## 2012-05-06 ENCOUNTER — Encounter (HOSPITAL_COMMUNITY)
Admission: EM | Disposition: A | Discharge status: Home / Self Care | Payer: Self-pay | Source: Home / Self Care | Attending: Emergency Medicine

## 2012-05-06 ENCOUNTER — Encounter (HOSPITAL_COMMUNITY): Payer: Self-pay | Admitting: *Deleted

## 2012-05-06 DIAGNOSIS — I10 Essential (primary) hypertension: Secondary | ICD-10-CM

## 2012-05-06 DIAGNOSIS — K2970 Gastritis, unspecified, without bleeding: Secondary | ICD-10-CM | POA: Diagnosis present

## 2012-05-06 DIAGNOSIS — K449 Diaphragmatic hernia without obstruction or gangrene: Secondary | ICD-10-CM

## 2012-05-06 DIAGNOSIS — K2990 Gastroduodenitis, unspecified, without bleeding: Secondary | ICD-10-CM

## 2012-05-06 DIAGNOSIS — IMO0001 Reserved for inherently not codable concepts without codable children: Secondary | ICD-10-CM

## 2012-05-06 HISTORY — PX: ESOPHAGOGASTRODUODENOSCOPY: SHX5428

## 2012-05-06 LAB — GLUCOSE, CAPILLARY
Glucose-Capillary: 246 mg/dL — ABNORMAL HIGH (ref 70–99)
Glucose-Capillary: 191 mg/dL — ABNORMAL HIGH (ref 70–99)
Glucose-Capillary: 177 mg/dL — ABNORMAL HIGH (ref 70–99)

## 2012-05-06 LAB — TSH: TSH: 3.206 u[IU]/mL (ref 0.350–4.500)

## 2012-05-06 SURGERY — EGD (ESOPHAGOGASTRODUODENOSCOPY)
Anesthesia: Moderate Sedation

## 2012-05-06 MED ORDER — STERILE WATER FOR IRRIGATION IR SOLN
Status: DC | PRN
  Administered 2012-05-06: 11:00:00

## 2012-05-06 MED ORDER — MIDAZOLAM HCL 5 MG/5ML IJ SOLN
INTRAMUSCULAR | Status: AC
  Filled 2012-05-06: qty 10

## 2012-05-06 MED ORDER — SODIUM CHLORIDE 0.9 % IV SOLN: INTRAVENOUS | Status: DC

## 2012-05-06 MED ORDER — INSULIN ASPART 100 UNIT/ML ~~LOC~~ SOLN
11.0000 [IU] | Freq: Three times a day (TID) | SUBCUTANEOUS | Status: DC
  Administered 2012-05-06: 11 [IU] via SUBCUTANEOUS

## 2012-05-06 MED ORDER — OMEPRAZOLE 40 MG PO CPDR: 40.0000 mg | DELAYED_RELEASE_CAPSULE | Freq: Two times a day (BID) | ORAL | Status: DC

## 2012-05-06 MED ORDER — INSULIN GLARGINE 100 UNIT/ML ~~LOC~~ SOLN: 80.0000 [IU] | Freq: Every day | SUBCUTANEOUS | Status: DC

## 2012-05-06 MED ORDER — MIDAZOLAM HCL 5 MG/5ML IJ SOLN
INTRAMUSCULAR | Status: DC | PRN
  Administered 2012-05-06 (×2): 2 mg via INTRAVENOUS

## 2012-05-06 MED ORDER — MEPERIDINE HCL 50 MG/ML IJ SOLN
INTRAMUSCULAR | Status: AC
  Filled 2012-05-06: qty 1

## 2012-05-06 MED ORDER — SUCRALFATE 1 GM/10ML PO SUSP: 1.0000 g | Freq: Three times a day (TID) | ORAL | Status: DC

## 2012-05-06 MED ORDER — INSULIN ASPART 100 UNIT/ML ~~LOC~~ SOLN
22.0000 [IU] | Freq: Three times a day (TID) | SUBCUTANEOUS | Status: DC
  Administered 2012-05-06: 22 [IU] via SUBCUTANEOUS

## 2012-05-06 MED ORDER — HYDROCODONE-ACETAMINOPHEN 5-500 MG PO TABS: 1.0000 | ORAL_TABLET | Freq: Four times a day (QID) | ORAL | Status: DC | PRN

## 2012-05-06 MED ORDER — BUTAMBEN-TETRACAINE-BENZOCAINE 2-2-14 % EX AERO
INHALATION_SPRAY | CUTANEOUS | Status: DC | PRN
  Administered 2012-05-06: 1 via TOPICAL

## 2012-05-06 MED ORDER — SODIUM CHLORIDE 0.45 % IV SOLN
Freq: Once | INTRAVENOUS | Status: AC
  Administered 2012-05-06: 1000 mL via INTRAVENOUS

## 2012-05-06 MED ORDER — MEPERIDINE HCL 25 MG/ML IJ SOLN
INTRAMUSCULAR | Status: DC | PRN
  Administered 2012-05-06 (×2): 25 mg via INTRAVENOUS

## 2012-05-06 NOTE — Op Note (Signed)
EGD PROCEDURE REPORT  PATIENT:  Holly Baldwin  MR#:  [id number] Birthdate:  [birth year], 67 y.o., female Endoscopist:  Dr. Rogene Houston, MD Procedure Date: 05/06/2012  Procedure:   EGD  Indications:  Patient is 67 year old African female who presents with a two-week history of progressive epigastric pain associated with nausea and heaving. Patient has been taking Excedrin Migraine 4 tablets a day for headache. She denies hematemesis or melena. Abdominopelvic CT was unremarkable other than fatty liver and small umbilicus and suprapubic ventral herniae. She has mildly elevated alkaline phosphatase possibly later diabetes mellitus.            Informed Consent:  The risks, benefits, alternatives & imponderables which include, but are not limited to, bleeding, infection, perforation, drug reaction and potential missed lesion have been reviewed.  The potential for biopsy, lesion removal, esophageal dilation, etc. have also been discussed.  Questions have been answered.  All parties agreeable.  Please see history & physical in medical record for more information.  Medications:  Demerol 50 mg IV Versed 5 mg IV Cetacaine spray topically for oropharyngeal anesthesia  Description of procedure:  The endoscope was introduced through the mouth and advanced to the second portion of the duodenum without difficulty or limitations. The mucosal surfaces were surveyed very carefully during advancement of the scope and upon withdrawal.  Findings:  Esophagus:  3 mm polyp noted in proximal esophagus. Cosa of the rest of the esophagus was normal. GE junction was unremarkable. GEJ:  39 cm Stomach:  Stomach was empty and distended very well with insufflation. Folds in the proximal stomach were normal. Examination of mucosa at body revealed patchy erythema but no ulcers identified a few prepyloric erosions noted. Two 5 mm submucosal lesions noted at antrum suspicious for small lipomas. Pyloric channel was patent.  Angularis fundus and cardia were examined by retroflexing the scope and were normal. Duodenum:  Normal bulbar and post bulbar mucosa.  Therapeutic/Diagnostic Maneuvers Performed:  Esophageal polyp was ablated via cold biopsy.  Complications:  None  Impression: 3 mm esophageal polyp ablated via cold biopsy. Erosive antral gastritis but no evidence of peptic ulcer disease. Incidental finding of two 5 mm submucosal lesions at antrum possibly lipomas.  Recommendations:  H. pylori serology. Modified carb diet. If she does well she could go home later today. I will contact patient with results of biopsy and H. pylori serology. Will schedule  screening colonoscopy within the next month or two. Patient advised to quit taking Excedrin and followup with  Dr. Hilma Favors for management of her headache.  Haydon Dorris U  05/06/2012  11:32 AM  CC: Dr. Delman Cheadle, PA-C & Dr. Rayne Du ref. provider found

## 2012-05-06 NOTE — Progress Notes (Signed)
UR Chart Review Completed  

## 2012-05-06 NOTE — Discharge Summary (Signed)
Physician Discharge Summary  Holly Baldwin [id number] DOB: [birth year] DOA: 05/05/2012  PCP: Jana Half  Admit date: 05/05/2012 Discharge date: 05/06/2012  Time spent: 35 minutes  Recommendations for Outpatient Follow-up:  1. PCP in 1 week for CBG check.  2. Recommend small frequent meals 3. Do not take Excedrin or aspirin products.  4. Dr. Olevia Perches office will call with any abnormal test results 5. Dr. Olevia Perches office will contact for screening colonoscopy in a month or so  Discharge Diagnoses:  Principal Problem:   Abdominal pain Active Problems:   DIABETES   HIGH BLOOD PRESSURE   Nausea & vomiting   Depression   GERD (gastroesophageal reflux disease)   DM (diabetes mellitus), type 2, uncontrolled   Unspecified hypothyroidism   Hiatal hernia   Malignant hypertension   Unspecified gastritis and gastroduodenitis without mention of hemorrhage   Discharge Condition: stable  Diet recommendation: Modified carb  Filed Weights   05/05/12 1101 05/05/12 1721  Weight: 102.967 kg (227 lb) 102.6 kg (226 lb 3.1 oz)    History of present illness:  Holly Baldwin is a 67 y.o. female with pmhx HTN, DM, GERD, hiatal hernia, depression presented on 05/05/12 to ED with cc abdominal pain. Information obtained from pt. Stated that pain started about 2 weeks prior to presentation and had gotten progressively worse. Pain located in upper quadrants of abdomen,  intermittent and described as burning. Associated symptoms include nausea and abdominal distention. She denied decreased po intake, constipation. Denied vomiting, sob, fever chills. Stated that pain  worse when lying flat and after eating. She took pepto bismol without relief. Reported having a BM the morning of admission. Pt reported that she frequently takes excedrin for headaches often several times per day. CT abdomen obtained yielding umbilical and suprapubic ventral hernias containing fat. Fatty infiltration of liver. Stable  minimal renal changes.No acute intra abdominal or intrapelvic process identified. On exam abdomen quite distended. Hospitalist asked to admit   Hospital Course:  Abdominal pain: pt admitted to medical floor. Concern for esophagitis, gastritis, gastroparesis. Abdominal CT reviewed with Dr. Thornton Papas. Ventral and umbilical hernias are small and only contain fat. No evidence of bowel obstruction. Pt was provided with PPI, clear liquids, pain medicine and anti-emetic. Pt made NPO in anticipation of EGD. Seen by GI on 05/06/12 and EGD performed. Results 45mm esophageal polyp ablated via cold biopsy. Erosive antral gastritis but no evidence of peptic ulcer disease. Incidental finding of two 5 mm submucosal lesions at antrum possibly lipomas. Pt tolerated procedure well and ate full carb modified diet afterwards without incident.  Recommendations are carb modified diet, and to quit taking Excedrin or aspirin product. Recommended small frequent meals and to remain sitting up after eating. Increased PPI to BID and added carafate. Educated to fact that healing may take a week or so. Provided pt with pain medicine as well. Dr. Olevia Perches office will contact patient with any abnormal results and schedule screening colonoscopy within the next month or two.   Active Problems:  DIABETES2: uncontrolled: CBG 192-264. Glucose high in spite of being NPO. Lantus was increase from 70-80 units. Continue meal coverage novolog. Pt instructed to follow up with PCP 1 week and to document daily CBG readings until that appointment. Instructed to take readings to PCP appointment for evaluation.    HIGH BLOOD PRESSURE: Somewhat  poorly controled during this hospitalization. Pain likely contributing. Home lasix initially held. At discharge BP control improve. SBP range 158-162. Resume home medications.    Nausea &  vomiting: see #1. No vomiting during this hospitalization. Given zofran for nausea. At discharge patient tolerating carb modified  diet without problem.  Depression: Remained at baseline during this hospitalization   GERD (gastroesophageal reflux disease): see #1.   Hypothyroidism: TSH results pending at discharge. Follow up with PCP 1 week. Continue home medication.     Procedures:  EGD 05/06/12  Consultations:  GI Dr Laural Golden  Discharge Exam: Filed Vitals:   05/06/12 1120 05/06/12 1125 05/06/12 1148 05/06/12 1413  BP: 174/83 162/75 162/83 158/62  Pulse: 71 70 70 71  Temp:   98.2 F (36.8 C) 98.4 F (36.9 C)  TempSrc:   Oral Oral  Resp: 13 13 16 16   Height:      Weight:      SpO2: 98% 98% 97% 69%    General: obese, flat affect, NAD Cardiovascular: RRR No murmur, gallup, rub. No lower extremity edema Respiratory: normal effort BSCTAB No wheeze, rhonchi  Abdomen: obese, soft +BS non-tender to palpation.  Discharge Instructions      Discharge Orders   Future Orders Complete By Expires     Diet - low sodium heart healthy  As directed     Diet Carb Modified  As directed     Discharge instructions  As directed     Comments:      Avoid excedrin and aspirin products May take tylenol for headaches Recommend small frequent meals Follow up with PCP 1 week for CBG check    Increase activity slowly  As directed     Increase activity slowly  As directed         Medication List    TAKE these medications       AZOR 10-40 MG per tablet  Generic drug:  amLODipine-olmesartan  Take 1 tablet by mouth every morning.     cloNIDine 0.2 MG tablet  Commonly known as:  CATAPRES  Take 0.2 mg by mouth 2 (two) times daily.     EPINEPHrine 0.3 mg/0.3 mL Devi  Commonly known as:  EPI-PEN  Inject 0.3 mg into the muscle once.     FLUoxetine 20 MG capsule  Commonly known as:  PROZAC  Take 20 mg by mouth daily.     furosemide 20 MG tablet  Commonly known as:  LASIX  Take 20 mg by mouth every morning.     HYDROcodone-acetaminophen 5-500 MG per tablet  Commonly known as:  VICODIN  Take 1 tablet by mouth  every 6 (six) hours as needed for pain.     insulin glargine 100 UNIT/ML injection  Commonly known as:  LANTUS  Inject 80 Units into the skin at bedtime.     levothyroxine 25 MCG tablet  Commonly known as:  SYNTHROID, LEVOTHROID  Take 25 mcg by mouth daily.     NOVOLOG FLEXPEN 100 UNIT/ML injection  Generic drug:  insulin aspart  Inject 22 Units into the skin 3 (three) times daily before meals.     omeprazole 40 MG capsule  Commonly known as:  PRILOSEC  Take 1 capsule (40 mg total) by mouth 2 (two) times daily.     promethazine 25 MG suppository  Commonly known as:  PHENERGAN  Place 1 suppository (25 mg total) rectally every 6 (six) hours as needed for nausea.     promethazine 25 MG tablet  Commonly known as:  PHENERGAN  Take 25 mg by mouth every 6 (six) hours as needed for nausea.     simvastatin 40 MG tablet  Commonly known as:  ZOCOR  Take 40 mg by mouth every evening.     sucralfate 1 GM/10ML suspension  Commonly known as:  CARAFATE  Take 10 mLs (1 g total) by mouth 4 (four) times daily -  with meals and at bedtime.     ziprasidone 80 MG capsule  Commonly known as:  GEODON  Take 80 mg by mouth at bedtime.     zolpidem 5 MG tablet  Commonly known as:  AMBIEN  Take 5 mg by mouth at bedtime as needed. Sleep       Follow-up Information   Follow up with REHMAN,NAJEEB U, MD. (Office will call you with any abnormal results of tests)    Contact information:   [address], [address] [place] [address] [phone]       Follow up with Delman Cheadle, PA-C. Schedule an appointment as soon as possible for a visit in 1 week. (follow up 1 week for CBG check. Please document all CBG readings until appointment with Dr. Glennon Mac and take information to appointment for evaluation. )    Contact information:   [address] [place] [id number] [phone]        The results of significant diagnostics from this hospitalization (including imaging,  microbiology, ancillary and laboratory) are listed below for reference.    Significant Diagnostic Studies: Ct Abdomen Pelvis W Contrast  05/05/2012  *RADIOLOGY REPORT*  Clinical Data: Abdominal pain for 2 weeks, nausea, fever, history hypertension, asthma, diabetes, hysterectomy  CT ABDOMEN AND PELVIS WITH CONTRAST  Technique:  Multidetector CT imaging of the abdomen and pelvis was performed following the standard protocol during bolus administration of intravenous contrast.  Contrast: 8mL OMNIPAQUE IOHEXOL 300 MG/ML  SOLN p.o., 140mL OMNIPAQUE IOHEXOL 300 MG/ML  SOLN IV  Comparison: 01/17/2011  Findings: Lung bases clear. Gallbladder surgically absent. Diffuse fatty infiltration of liver. Low attenuation focus posterior right lobe liver 15 x 10 mm unchanged. Curvilinear calcification mid left kidney unchanged. Tiny bilateral renal cysts. Liver, spleen, pancreas, kidneys, and adrenal glands otherwise normal appearance.  Unremarkable bladder and ureters. Uterus surgically absent with normal sized ovaries. Mildly prominent appendiceal size without wall thickening or inflammatory changes similar to previous exam. Stomach and bowel loops normal appearance. Small umbilical hernia containing fat. Additional small suprapubic ventral hernia containing fat.  No mass, adenopathy, free fluid or inflammatory process. Scattered pelvic phleboliths stable probable splenule. Degenerative disc disease changes L5-S1.  IMPRESSION: Umbilical and suprapubic ventral hernias containing fat. Fatty infiltration of liver. Stable minimal renal changes as above. No acute intra abdominal or intrapelvic process identified.   Original Report Authenticated By: Lavonia Dana, M.D.     Microbiology: No results found for this or any previous visit (from the past 240 hour(s)).   Labs: Basic Metabolic Panel:  Recent Labs Lab 05/05/12 1230  NA 141  K 3.8  CL 102  CO2 26  GLUCOSE 356*  BUN 8  CREATININE 0.58  CALCIUM 9.4   Liver  Function Tests:  Recent Labs Lab 05/05/12 1230  AST 23  ALT 27  ALKPHOS 161*  BILITOT 0.2*  PROT 7.3  ALBUMIN 3.8    Recent Labs Lab 05/05/12 1230  LIPASE 12   No results found for this basename: AMMONIA,  in the last 168 hours CBC:  Recent Labs Lab 05/05/12 1230  WBC 8.6  NEUTROABS 6.9  HGB 13.8  HCT 40.6  MCV 77.8*  PLT 287   Cardiac Enzymes: No results found for this basename:  CKTOTAL, CKMB, CKMBINDEX, TROPONINI,  in the last 168 hours BNP: BNP (last 3 results) No results found for this basename: PROBNP,  in the last 8760 hours CBG:  Recent Labs Lab 05/05/12 1701 05/05/12 2101 05/06/12 0738 05/06/12 1034 05/06/12 1200  GLUCAP 261* 192* 246* 191* 177*       Signed:  BLACK,KAREN M  [facility] 05/06/2012, 3:24 PM  Doree Barthel, M.D.

## 2012-05-06 NOTE — Progress Notes (Signed)
[facility] PROGRESS NOTE  Holly Baldwin [id number] DOB: 09-Mar-1945 DOA: 05/05/2012 PCP: Jana Half  Assessment/Plan: Abdominal pain: Much improved this am.  Concern for esophagitis, gastritis or gastroparesis in pt who takes excedrin regularly. Appreciate GI assistance. Scheduled for EGD to day.  Continue  PPI, and pain med.  Reviewed the film with Dr. Darcella Cheshire. Ventral and umbilical hernias are small and only contain fat. No evidence of bowel obstruction  Active Problems:  DIABETES2: uncontrolled: CBG 192-264. Continue lantus qhs and novolog at meals. Currently pt NPO.    HIGH BLOOD PRESSURE:  Remains poorly controled. Pain probably contributing. Also home lasix on hold. Plan to resume when appropriate.    Nausea & vomiting: see #1. No vomiting since admission. Nausea improved. Continue  anti-emetic.  Depression: appears at baseline. Continue home meds.   GERD (gastroesophageal reflux disease): see #1.   Hypothyroidism: TSH results pending.     Code Status: full Family Communication:  Disposition Plan: home when ready   Consultants:  GI  Procedures:  EGD  Antibiotics:  none  HPI/Subjective: Awake alert reports feeling better.   Objective: Filed Vitals:   05/05/12 1520 05/05/12 1721 05/05/12 2105 05/06/12 0514  BP: 193/88 186/78 197/81 160/90  Pulse: 92 87 79 73  Temp:  99.3 F (37.4 C) 98.7 F (37.1 C) 98.2 F (36.8 C)  TempSrc:  Oral    Resp: 20  20 20   Height:  5\' 4"  (1.626 m)    Weight:  102.6 kg (226 lb 3.1 oz)    SpO2: 95% 96% 91% 93%    Intake/Output Summary (Last 24 hours) at 05/06/12 0946 Last data filed at 05/06/12 0900  Gross per 24 hour  Intake      0 ml  Output      0 ml  Net      0 ml   Filed Weights   05/05/12 1101 05/05/12 1721  Weight: 102.967 kg (227 lb) 102.6 kg (226 lb 3.1 oz)    Exam:   General:  Obese, alert NAD  Cardiovascular: RRR No MGR No lower extremity edema  Respiratory: normal effort BSCTAB no  wheeze no rhonchi  Abdomen: somewhat distended, soft, +BS throughout  Musculoskeletal: joints without edema/erythema full rom.    Data Reviewed: Basic Metabolic Panel:  Recent Labs Lab 05/05/12 1230  NA 141  K 3.8  CL 102  CO2 26  GLUCOSE 356*  BUN 8  CREATININE 0.58  CALCIUM 9.4   Liver Function Tests:  Recent Labs Lab 05/05/12 1230  AST 23  ALT 27  ALKPHOS 161*  BILITOT 0.2*  PROT 7.3  ALBUMIN 3.8    Recent Labs Lab 05/05/12 1230  LIPASE 12   No results found for this basename: AMMONIA,  in the last 168 hours CBC:  Recent Labs Lab 05/05/12 1230  WBC 8.6  NEUTROABS 6.9  HGB 13.8  HCT 40.6  MCV 77.8*  PLT 287   Cardiac Enzymes: No results found for this basename: CKTOTAL, CKMB, CKMBINDEX, TROPONINI,  in the last 168 hours BNP (last 3 results) No results found for this basename: PROBNP,  in the last 8760 hours CBG:  Recent Labs Lab 05/05/12 1129 05/05/12 1509 05/05/12 1701 05/05/12 2101 05/06/12 0738  GLUCAP 388* 281* 261* 192* 246*    No results found for this or any previous visit (from the past 240 hour(s)).   Studies: Ct Abdomen Pelvis W Contrast  05/05/2012  *RADIOLOGY REPORT*  Clinical Data: Abdominal pain for 2 weeks, nausea,  fever, history hypertension, asthma, diabetes, hysterectomy  CT ABDOMEN AND PELVIS WITH CONTRAST  Technique:  Multidetector CT imaging of the abdomen and pelvis was performed following the standard protocol during bolus administration of intravenous contrast.  Contrast: 83mL OMNIPAQUE IOHEXOL 300 MG/ML  SOLN p.o., 174mL OMNIPAQUE IOHEXOL 300 MG/ML  SOLN IV  Comparison: 01/17/2011  Findings: Lung bases clear. Gallbladder surgically absent. Diffuse fatty infiltration of liver. Low attenuation focus posterior right lobe liver 15 x 10 mm unchanged. Curvilinear calcification mid left kidney unchanged. Tiny bilateral renal cysts. Liver, spleen, pancreas, kidneys, and adrenal glands otherwise normal appearance.  Unremarkable  bladder and ureters. Uterus surgically absent with normal sized ovaries. Mildly prominent appendiceal size without wall thickening or inflammatory changes similar to previous exam. Stomach and bowel loops normal appearance. Small umbilical hernia containing fat. Additional small suprapubic ventral hernia containing fat.  No mass, adenopathy, free fluid or inflammatory process. Scattered pelvic phleboliths stable probable splenule. Degenerative disc disease changes L5-S1.  IMPRESSION: Umbilical and suprapubic ventral hernias containing fat. Fatty infiltration of liver. Stable minimal renal changes as above. No acute intra abdominal or intrapelvic process identified.   Original Report Authenticated By: Lavonia Dana, M.D.     Scheduled Meds: . amLODipine  10 mg Oral Daily   And  . irbesartan  300 mg Oral Daily  . cloNIDine  0.2 mg Oral BID  . docusate sodium  100 mg Oral BID  . FLUoxetine  20 mg Oral Daily  . insulin aspart  11 Units Subcutaneous TID AC  . insulin glargine  70 Units Subcutaneous QHS  . levothyroxine  25 mcg Oral QAC breakfast  . pantoprazole  40 mg Oral BID AC  . ziprasidone  80 mg Oral QHS   Continuous Infusions: . 0.9 % NaCl with KCl 20 mEq / L 125 mL/hr at 05/05/12 1754    Principal Problem:   Abdominal pain Active Problems:   DIABETES   HIGH BLOOD PRESSURE   Nausea & vomiting   Depression   GERD (gastroesophageal reflux disease)   DM (diabetes mellitus), type 2, uncontrolled   Unspecified hypothyroidism   Hiatal hernia   Malignant hypertension    Time spent: 30 minutes    [facility]  If 7PM-7AM, please contact night-coverage at [email or web address], password [email or web address] 05/06/2012, 9:46 AM  LOS: 1 day   Attending note: Patient interviewed and examined independently. Await endoscopy.  Doree Barthel, M.D.

## 2012-05-06 NOTE — Consult Note (Signed)
Reason for Consult: Epigastric pain Referring Physician: Hospitalist  Holly Baldwin is an 67 y.o. female.  HPI: Admitted thru the ED last night with epigastric pain. She had had the pain for approximately 2 weeks.She describes the pain as constant. She tells me she had been taking Excedrin Migraine about 2 a day. She says that every time she took the Excedrin Migraine she would have abdominal pain.  She also takes an ASA 81mg  daily.  She also has acid reflux on a daily basis. She takes Omeprazole for her acid reflux. Her appetite is okay. No weight loss. Her BMs are normal. BMs are brown in color. No melena or bright red rectal bleeding. Hx of diabetes, high blood pressure and hyperlipidemia. Previous cholecystectomy.  EGD 02/16/2002: FINAL DIAGNOSES: Small sliding hiatal hernia. No endoscopic evidence of  reflux esophagitis or Barrett's esophagus. Her diaphragmatic hiatus is wide  open. This perhaps would explain the difficulty controlling her symptoms.  She may eventually need a promotility agent.  03/08/2002 Colonoscopy: FINAL DIAGNOSIS: Examination performed to cecum. Single small polyp  ablated by cold biopsy from the sigmoid colon.  RECOMMENDATIONS: She will continue antireflux measures and Prilosec as  before. She will have a CBC by Bonne Dolores, M.D. on her next visit in the  next few weeks.   Past Medical History  Diagnosis Date  . Hypertension   . Diabetes mellitus   . Hypercholesteremia   . Anxiety   . GERD (gastroesophageal reflux disease)   . Hiatal hernia     Past Surgical History  Procedure Laterality Date  . Abdominal hysterectomy      History reviewed. No pertinent family history.  Social History:  reports that she has never smoked. She does not have any smokeless tobacco history on file. She reports that she does not drink alcohol or use illicit drugs.  Allergies:  Allergies  Allergen Reactions  . Bee Venom Anaphylaxis  . Penicillins Hives     Medications: I have reviewed the patient's current medications.  Results for orders placed during the hospital encounter of 05/05/12 (from the past 48 hour(s))  GLUCOSE, CAPILLARY     Status: Abnormal   Collection Time    05/05/12 11:29 AM      Result Value Range   Glucose-Capillary 388 (*) 70 - 99 mg/dL  CBC WITH DIFFERENTIAL     Status: Abnormal   Collection Time    05/05/12 12:30 PM      Result Value Range   WBC 8.6  4.0 - 10.5 K/uL   RBC 5.22 (*) 3.87 - 5.11 MIL/uL   Hemoglobin 13.8  12.0 - 15.0 g/dL   HCT 40.6  36.0 - 46.0 %   MCV 77.8 (*) 78.0 - 100.0 fL   MCH 26.4  26.0 - 34.0 pg   MCHC 34.0  30.0 - 36.0 g/dL   RDW 14.9  11.5 - 15.5 %   Platelets 287  150 - 400 K/uL   Neutrophils Relative 80 (*) 43 - 77 %   Neutro Abs 6.9  1.7 - 7.7 K/uL   Lymphocytes Relative 13  12 - 46 %   Lymphs Abs 1.1  0.7 - 4.0 K/uL   Monocytes Relative 7  3 - 12 %   Monocytes Absolute 0.6  0.1 - 1.0 K/uL   Eosinophils Relative 0  0 - 5 %   Eosinophils Absolute 0.0  0.0 - 0.7 K/uL   Basophils Relative 1  0 - 1 %   Basophils Absolute  0.0  0.0 - 0.1 K/uL  COMPREHENSIVE METABOLIC PANEL     Status: Abnormal   Collection Time    05/05/12 12:30 PM      Result Value Range   Sodium 141  135 - 145 mEq/L   Potassium 3.8  3.5 - 5.1 mEq/L   Chloride 102  96 - 112 mEq/L   CO2 26  19 - 32 mEq/L   Glucose, Bld 356 (*) 70 - 99 mg/dL   BUN 8  6 - 23 mg/dL   Creatinine, Ser 0.58  0.50 - 1.10 mg/dL   Calcium 9.4  8.4 - 10.5 mg/dL   Total Protein 7.3  6.0 - 8.3 g/dL   Albumin 3.8  3.5 - 5.2 g/dL   AST 23  0 - 37 U/L   ALT 27  0 - 35 U/L   Alkaline Phosphatase 161 (*) 39 - 117 U/L   Total Bilirubin 0.2 (*) 0.3 - 1.2 mg/dL   GFR calc non Af Amer >90  >90 mL/min   GFR calc Af Amer >90  >90 mL/min   Comment:            The eGFR has been calculated     using the CKD EPI equation.     This calculation has not been     validated in all clinical     situations.     eGFR's persistently     <90 mL/min  signify     possible Chronic Kidney Disease.  LIPASE, BLOOD     Status: None   Collection Time    05/05/12 12:30 PM      Result Value Range   Lipase 12  11 - 59 U/L  URINALYSIS, ROUTINE W REFLEX MICROSCOPIC     Status: Abnormal   Collection Time    05/05/12  1:18 PM      Result Value Range   Color, Urine YELLOW  YELLOW   APPearance CLEAR  CLEAR   Specific Gravity, Urine 1.020  1.005 - 1.030   pH 6.5  5.0 - 8.0   Glucose, UA >1000 (*) NEGATIVE mg/dL   Hgb urine dipstick SMALL (*) NEGATIVE   Bilirubin Urine NEGATIVE  NEGATIVE   Ketones, ur TRACE (*) NEGATIVE mg/dL   Protein, ur 100 (*) NEGATIVE mg/dL   Urobilinogen, UA 0.2  0.0 - 1.0 mg/dL   Nitrite NEGATIVE  NEGATIVE   Leukocytes, UA NEGATIVE  NEGATIVE  URINE MICROSCOPIC-ADD ON     Status: Abnormal   Collection Time    05/05/12  1:18 PM      Result Value Range   Squamous Epithelial / LPF FEW (*) RARE   WBC, UA 0-2  <3 WBC/hpf   RBC / HPF 0-2  <3 RBC/hpf   Bacteria, UA RARE  RARE  GLUCOSE, CAPILLARY     Status: Abnormal   Collection Time    05/05/12  3:09 PM      Result Value Range   Glucose-Capillary 281 (*) 70 - 99 mg/dL  GLUCOSE, CAPILLARY     Status: Abnormal   Collection Time    05/05/12  5:01 PM      Result Value Range   Glucose-Capillary 261 (*) 70 - 99 mg/dL   Comment 1 Notify RN     Comment 2 Documented in Chart    GLUCOSE, CAPILLARY     Status: Abnormal   Collection Time    05/05/12  9:01 PM      Result Value Range  Glucose-Capillary 192 (*) 70 - 99 mg/dL   Comment 1 Notify RN    GLUCOSE, CAPILLARY     Status: Abnormal   Collection Time    05/06/12  7:38 AM      Result Value Range   Glucose-Capillary 246 (*) 70 - 99 mg/dL   Comment 1 Documented in Chart     Comment 2 Notify RN      Ct Abdomen Pelvis W Contrast  05/05/2012  *RADIOLOGY REPORT*  Clinical Data: Abdominal pain for 2 weeks, nausea, fever, history hypertension, asthma, diabetes, hysterectomy  CT ABDOMEN AND PELVIS WITH CONTRAST  Technique:   Multidetector CT imaging of the abdomen and pelvis was performed following the standard protocol during bolus administration of intravenous contrast.  Contrast: 32mL OMNIPAQUE IOHEXOL 300 MG/ML  SOLN p.o., 177mL OMNIPAQUE IOHEXOL 300 MG/ML  SOLN IV  Comparison: 01/17/2011  Findings: Lung bases clear. Gallbladder surgically absent. Diffuse fatty infiltration of liver. Low attenuation focus posterior right lobe liver 15 x 10 mm unchanged. Curvilinear calcification mid left kidney unchanged. Tiny bilateral renal cysts. Liver, spleen, pancreas, kidneys, and adrenal glands otherwise normal appearance.  Unremarkable bladder and ureters. Uterus surgically absent with normal sized ovaries. Mildly prominent appendiceal size without wall thickening or inflammatory changes similar to previous exam. Stomach and bowel loops normal appearance. Small umbilical hernia containing fat. Additional small suprapubic ventral hernia containing fat.  No mass, adenopathy, free fluid or inflammatory process. Scattered pelvic phleboliths stable probable splenule. Degenerative disc disease changes L5-S1.  IMPRESSION: Umbilical and suprapubic ventral hernias containing fat. Fatty infiltration of liver. Stable minimal renal changes as above. No acute intra abdominal or intrapelvic process identified.   Original Report Authenticated By: Lavonia Dana, M.D.     ROS Blood pressure 160/90, pulse 73, temperature 98.2 F (36.8 C), temperature source Oral, resp. rate 20, height 5\' 4"  (1.626 m), weight 226 lb 3.1 oz (102.6 kg), SpO2 93.00%. Physical Exam Alert and oriented. Skin warm and dry. Oral mucosa is moist.   . Sclera anicteric, conjunctivae is pink. Thyroid not enlarged. No cervical lymphadenopathy. Lungs clear. Heart regular rate and rhythm.  Abdomen is soft. Bowel sounds are positive. No hepatomegaly.Abdomen is obese. No abdominal masses felt. Epigastric tenderness  No edema to lower extremities.    Assessment/Plan: GERD. PUD needs to be  ruled out. Patient has been taking Excedrin Migraine on a daily basis plus a Baby ASA daily. Patient will need an EGD. Dr. Laural Golden is aware. Agree with Protonix 40mg  po.  Deberah Castle W 05/06/2012, 8:22 AM     GI attending,s note; Patient interviewed and examined. Suspect peptic ulcers is secondary to NSAID therapy. Patient is agreeable to proceeding with diagnostic EGD. She will also need screening colonoscopy which will be scheduled at a later date on an outpatient basis when she is over her acute illness.

## 2012-05-06 NOTE — Progress Notes (Signed)
AVS reviewed with patient and patient's husband.  Prescriptions provided to patient.  Pt and pt's spouse verbalized understanding of d/c orders.  Pt's IV removed.  Site WNL>

## 2012-05-06 NOTE — Progress Notes (Signed)
Inpatient Diabetes Program Recommendations  AACE/ADA: New Consensus Statement on Inpatient Glycemic Control (2013)  Target Ranges:  Prepandial:   less than 140 mg/dL      Peak postprandial:   less than 180 mg/dL (1-2 hours)      Critically ill patients:  140 - 180 mg/dL    Results for ALEATHEA, VILLARI (MRN [id number]) as of 05/06/2012 11:10  Ref. Range 05/05/2012 11:29 05/05/2012 15:09 05/05/2012 17:01 05/05/2012 21:01  Glucose-Capillary Latest Range: 70-99 mg/dL 388 (H) 281 (H) 261 (H) 192 (H)   Noted patient has not yet started Lantus.  Patient received 11 units Novolog this morning.  Current inpatient insulin regimen: Lantus 70 units QHS Novolog 11 units tid with meals  MD- Please consider starting Novolog Sensitive correction scale tid ac + HS.  Will follow. Wyn Quaker RN, MSN, CDE Diabetes Coordinator Inpatient Diabetes Program [phone]

## 2012-05-09 LAB — H. PYLORI ANTIBODY, IGG: H Pylori IgG: 3.98 {ISR} — ABNORMAL HIGH

## 2012-05-10 ENCOUNTER — Encounter (HOSPITAL_COMMUNITY): Payer: Self-pay | Admitting: Internal Medicine

## 2012-05-10 ENCOUNTER — Other Ambulatory Visit (INDEPENDENT_AMBULATORY_CARE_PROVIDER_SITE_OTHER): Payer: Self-pay | Admitting: Internal Medicine

## 2012-05-10 MED ORDER — BIS SUBCIT-METRONID-TETRACYC 140-125-125 MG PO CAPS: 3.0000 | ORAL_CAPSULE | Freq: Three times a day (TID) | ORAL | Status: DC

## 2012-05-13 NOTE — H&P (Signed)
  NTS SOAP Note  Vital Signs:  Vitals as of: [id number]: Systolic 123XX123: Diastolic 85: Heart Rate 69: Temp 99.90F: Height 20ft 4in: Weight 223Lbs 5 Ounces: Pain Level 10: BMI 38  BMI : 38.33 kg/m2  Subjective: This 68 Years 27 Months old Female presents for of a mass on the right thigh.  Has been present for some time, but is increasing in size and causing her discomfort.  Review of Symptoms:  Constitutional:unremarkable   Head:unremarkable    Eyes:unremarkable   Nose/Mouth/Throat:unremarkable Cardiovascular:  unremarkable   Respiratory:unremarkable   Gastrointestinal:  unremarkable   Genitourinary:unremarkable       as noted above Skin:unremarkable Hematolgic/Lymphatic:unremarkable     Allergic/Immunologic:unremarkable     Past Medical History:    Reviewed   Past Medical History  Surgical History: unremarkable Medical Problems:  High Blood pressure, High cholesterol, Hypothyroidism Psychiatric History:  Depression Allergies: nkda Medications: simvastatin, zipradone, levothyroxine, omeprazole, lasix, pylera, hydrocodone   Social History:Reviewed  Social History  Preferred Language: English Race:  Black or African American Ethnicity: Not Hispanic / Latino Age: 67 Years 11 Months Marital Status:  M Alcohol:  No Recreational drug(s):  No   Smoking Status: Never smoker reviewed on 05/13/2012 Functional Status reviewed on mm/dd/yyyy ------------------------------------------------ Bathing: Normal Cooking: Normal Dressing: Normal Driving: Normal Eating: Normal Managing Meds: Normal Oral Care: Normal Shopping: Normal Toileting: Normal Transferring: Normal Walking: Normal Cognitive Status reviewed on mm/dd/yyyy ------------------------------------------------ Attention: Normal Decision Making: Normal Language: Normal Memory: Normal Motor: Normal Perception: Normal Problem Solving: Normal Visual and Spatial:  Normal   Family History:  Reviewed   Family History  Is there a family history [id number]    Objective Information: General:  Well appearing, well nourished in no distress. Heart:  RRR, no murmur Lungs:    CTA bilaterally, no wheezes, rhonchi, rales.  Breathing unlabored.   Ovoid, 5cm subcutaneous mass on anterior right thigh.  Tender to touch.  Mobile.  No overlying skin changes.  Assessment:Neoplasm, unspecified, right thigh  Diagnosis &amp; Procedure:    Plan:Scheduled for excision of the neoplasm, right thigh on 05/26/12.   Patient Education:Alternative treatments to surgery were discussed with patient (and family).  Risks and benefits  of procedure were fully explained to the patient (and family) who gave informed consent. Patient/family questions were addressed.  Follow-up:Pending Surgery

## 2012-05-18 ENCOUNTER — Encounter (HOSPITAL_COMMUNITY): Payer: Self-pay | Admitting: Pharmacy Technician

## 2012-05-19 NOTE — Patient Instructions (Addendum)
    BRYNLYNN IKE  05/19/2012   Your procedure is scheduled on:  05/26/2012  Report to [facility] at  700  AM.  Call this number if you have problems the morning of surgery: [phone]   Remember:   Do not eat food or drink liquids after midnight.   Take these medicines the morning of surgery with A SIP OF WATER: vicodin,azor,geodon,prozac,synthroid,prilosec. Take 1/2 lantus doseage night before your surgery.   Do not wear jewelry, make-up or nail polish.  Do not wear lotions, powders, or perfumes.   Do not shave 48 hours prior to surgery. Men may shave face and neck.  Do not bring valuables to the hospital.  Contacts, dentures or bridgework may not be worn into surgery.  Leave suitcase in the car. After surgery it may be brought to your room.  For patients admitted to the hospital, checkout time is 11:00 AM the day of discharge.   Patients discharged the day of surgery will not be allowed to drive  home.  Name and phone number of your driver: family  Special Instructions: Shower using CHG 2 nights before surgery and the night before surgery.  If you shower the day of surgery use CHG.  Use special wash - you have one bottle of CHG for all showers.  You should use approximately 1/3 of the bottle for each shower.   Please read over the following fact sheets that you were given: Pain Booklet, Coughing and Deep Breathing, MRSA Information and Surgical Site Infection Prevention PATIENT INSTRUCTIONS POST-ANESTHESIA  IMMEDIATELY FOLLOWING SURGERY:  Do not drive or operate machinery for the first twenty four hours after surgery.  Do not make any important decisions for twenty four hours after surgery or while taking narcotic pain medications or sedatives.  If you develop intractable nausea and vomiting or a severe headache please notify your doctor immediately.  FOLLOW-UP:  Please make an appointment with your surgeon as instructed. You do not need to follow up with anesthesia unless specifically  instructed to do so.  WOUND CARE INSTRUCTIONS (if applicable):  Keep a dry clean dressing on the anesthesia/puncture wound site if there is drainage.  Once the wound has quit draining you may leave it open to air.  Generally you should leave the bandage intact for twenty four hours unless there is drainage.  If the epidural site drains for more than 36-48 hours please call the anesthesia department.  QUESTIONS?:  Please feel free to call your physician or the hospital operator if you have any questions, and they will be happy to assist you.

## 2012-05-20 ENCOUNTER — Encounter (HOSPITAL_COMMUNITY): Payer: Self-pay

## 2012-05-20 ENCOUNTER — Encounter (HOSPITAL_COMMUNITY)
Admission: RE | Admit: 2012-05-20 | Discharge: 2012-05-20 | Disposition: A | Discharge status: Home / Self Care | Payer: Medicare Other | Source: Ambulatory Visit | Attending: General Surgery | Admitting: General Surgery

## 2012-05-20 LAB — SURGICAL PCR SCREEN
MRSA, PCR: NEGATIVE
Staphylococcus aureus: NEGATIVE

## 2012-05-20 NOTE — Pre-Procedure Instructions (Signed)
Dr Patsey Berthold aware of labs from 05/05/2012, ok to use these.

## 2012-05-21 ENCOUNTER — Encounter (INDEPENDENT_AMBULATORY_CARE_PROVIDER_SITE_OTHER): Payer: Self-pay | Admitting: *Deleted

## 2012-05-26 ENCOUNTER — Encounter (HOSPITAL_COMMUNITY)
Admission: RE | Disposition: A | Discharge status: Home / Self Care | Payer: Self-pay | Source: Ambulatory Visit | Attending: General Surgery

## 2012-05-26 ENCOUNTER — Encounter (HOSPITAL_COMMUNITY): Payer: Self-pay | Admitting: Anesthesiology

## 2012-05-26 ENCOUNTER — Ambulatory Visit (HOSPITAL_COMMUNITY)
Admission: RE | Admit: 2012-05-26 | Discharge: 2012-05-26 | Disposition: A | Discharge status: Home / Self Care | Payer: Medicare Other | Source: Ambulatory Visit | Attending: General Surgery | Admitting: General Surgery

## 2012-05-26 ENCOUNTER — Encounter (HOSPITAL_COMMUNITY): Payer: Self-pay | Admitting: *Deleted

## 2012-05-26 ENCOUNTER — Ambulatory Visit (HOSPITAL_COMMUNITY): Payer: Medicare Other | Admitting: Anesthesiology

## 2012-05-26 DIAGNOSIS — E7800 Pure hypercholesterolemia, unspecified: Secondary | ICD-10-CM | POA: Insufficient documentation

## 2012-05-26 DIAGNOSIS — D1739 Benign lipomatous neoplasm of skin and subcutaneous tissue of other sites: Secondary | ICD-10-CM | POA: Insufficient documentation

## 2012-05-26 DIAGNOSIS — Z01812 Encounter for preprocedural laboratory examination: Secondary | ICD-10-CM | POA: Insufficient documentation

## 2012-05-26 DIAGNOSIS — I10 Essential (primary) hypertension: Secondary | ICD-10-CM | POA: Insufficient documentation

## 2012-05-26 HISTORY — PX: MASS EXCISION: SHX2000

## 2012-05-26 LAB — GLUCOSE, CAPILLARY: Glucose-Capillary: 262 mg/dL — ABNORMAL HIGH (ref 70–99)

## 2012-05-26 SURGERY — EXCISION MASS
Anesthesia: General | Site: Thigh | Laterality: Right | Wound class: Clean

## 2012-05-26 MED ORDER — FENTANYL CITRATE 0.05 MG/ML IJ SOLN
INTRAMUSCULAR | Status: AC
  Filled 2012-05-26: qty 5

## 2012-05-26 MED ORDER — CHLORHEXIDINE GLUCONATE 4 % EX LIQD: 1.0000 "application " | Freq: Once | CUTANEOUS | Status: DC

## 2012-05-26 MED ORDER — MIDAZOLAM HCL 2 MG/2ML IJ SOLN
1.0000 mg | INTRAMUSCULAR | Status: DC | PRN
  Administered 2012-05-26: 2 mg via INTRAVENOUS

## 2012-05-26 MED ORDER — ONDANSETRON HCL 4 MG/2ML IJ SOLN: 4.0000 mg | Freq: Once | INTRAMUSCULAR | Status: DC | PRN

## 2012-05-26 MED ORDER — BUPIVACAINE HCL (PF) 0.5 % IJ SOLN
INTRAMUSCULAR | Status: AC
  Filled 2012-05-26: qty 30

## 2012-05-26 MED ORDER — ONDANSETRON HCL 4 MG/2ML IJ SOLN
4.0000 mg | Freq: Once | INTRAMUSCULAR | Status: AC
  Administered 2012-05-26: 4 mg via INTRAVENOUS

## 2012-05-26 MED ORDER — LACTATED RINGERS IV SOLN
INTRAVENOUS | Status: DC
  Administered 2012-05-26: 1000 mL via INTRAVENOUS

## 2012-05-26 MED ORDER — HYDROCODONE-ACETAMINOPHEN 5-500 MG PO TABS: 1.0000 | ORAL_TABLET | Freq: Four times a day (QID) | ORAL | Status: DC | PRN

## 2012-05-26 MED ORDER — BUPIVACAINE HCL 0.5 % IJ SOLN
INTRAMUSCULAR | Status: DC | PRN
  Administered 2012-05-26: 5 mL

## 2012-05-26 MED ORDER — LIDOCAINE HCL (PF) 1 % IJ SOLN
INTRAMUSCULAR | Status: AC
  Filled 2012-05-26: qty 5

## 2012-05-26 MED ORDER — FENTANYL CITRATE 0.05 MG/ML IJ SOLN
INTRAMUSCULAR | Status: DC | PRN
  Administered 2012-05-26 (×3): 50 ug via INTRAVENOUS

## 2012-05-26 MED ORDER — SUCCINYLCHOLINE CHLORIDE 20 MG/ML IJ SOLN
INTRAMUSCULAR | Status: AC
  Filled 2012-05-26: qty 1

## 2012-05-26 MED ORDER — FENTANYL CITRATE 0.05 MG/ML IJ SOLN: 25.0000 ug | INTRAMUSCULAR | Status: DC | PRN

## 2012-05-26 MED ORDER — LIDOCAINE HCL (CARDIAC) 20 MG/ML IV SOLN
INTRAVENOUS | Status: DC | PRN
  Administered 2012-05-26: 40 mg via INTRAVENOUS

## 2012-05-26 MED ORDER — KETOROLAC TROMETHAMINE 30 MG/ML IJ SOLN
30.0000 mg | Freq: Once | INTRAMUSCULAR | Status: AC
  Administered 2012-05-26: 30 mg via INTRAVENOUS

## 2012-05-26 MED ORDER — KETOROLAC TROMETHAMINE 30 MG/ML IJ SOLN
INTRAMUSCULAR | Status: AC
  Filled 2012-05-26: qty 1

## 2012-05-26 MED ORDER — PROPOFOL 10 MG/ML IV BOLUS
INTRAVENOUS | Status: DC | PRN
  Administered 2012-05-26: 60 mg via INTRAVENOUS

## 2012-05-26 MED ORDER — ONDANSETRON HCL 4 MG/2ML IJ SOLN
INTRAMUSCULAR | Status: AC
  Filled 2012-05-26: qty 2

## 2012-05-26 MED ORDER — SODIUM CHLORIDE 0.9 % IR SOLN
Status: DC | PRN
  Administered 2012-05-26: 1000 mL

## 2012-05-26 MED ORDER — SUCCINYLCHOLINE CHLORIDE 20 MG/ML IJ SOLN
INTRAMUSCULAR | Status: DC | PRN
  Administered 2012-05-26: 120 mg via INTRAVENOUS

## 2012-05-26 MED ORDER — MIDAZOLAM HCL 2 MG/2ML IJ SOLN
INTRAMUSCULAR | Status: AC
  Filled 2012-05-26: qty 2

## 2012-05-26 MED ORDER — ROCURONIUM BROMIDE 50 MG/5ML IV SOLN
INTRAVENOUS | Status: AC
  Filled 2012-05-26: qty 1

## 2012-05-26 MED ORDER — PROPOFOL 10 MG/ML IV EMUL
INTRAVENOUS | Status: AC
  Filled 2012-05-26: qty 20

## 2012-05-26 SURGICAL SUPPLY — 29 items
BAG HAMPER (MISCELLANEOUS) ×2 IMPLANT
CLOTH BEACON ORANGE TIMEOUT ST (SAFETY) ×2 IMPLANT
COVER LIGHT HANDLE STERIS (MISCELLANEOUS) ×4 IMPLANT
DECANTER SPIKE VIAL GLASS SM (MISCELLANEOUS) ×2 IMPLANT
DERMABOND ADVANCED (GAUZE/BANDAGES/DRESSINGS) ×2
DERMABOND ADVANCED .7 DNX12 (GAUZE/BANDAGES/DRESSINGS) ×2 IMPLANT
DURAPREP 26ML APPLICATOR (WOUND CARE) ×2 IMPLANT
ELECT NEEDLE TIP 2.8 STRL (NEEDLE) ×2 IMPLANT
ELECT REM PT RETURN 9FT ADLT (ELECTROSURGICAL) ×2
ELECTRODE REM PT RTRN 9FT ADLT (ELECTROSURGICAL) ×1 IMPLANT
FORMALIN 10 PREFIL 120ML (MISCELLANEOUS) ×2 IMPLANT
GLOVE BIO SURGEON STRL SZ7.5 (GLOVE) ×2 IMPLANT
GLOVE BIOGEL PI IND STRL 7.5 (GLOVE) ×2 IMPLANT
GLOVE BIOGEL PI INDICATOR 7.5 (GLOVE) ×2
GLOVE ECLIPSE 7.0 STRL STRAW (GLOVE) ×2 IMPLANT
GOWN STRL REIN XL XLG (GOWN DISPOSABLE) ×6 IMPLANT
KIT ROOM TURNOVER APOR (KITS) ×2 IMPLANT
MANIFOLD NEPTUNE II (INSTRUMENTS) ×2 IMPLANT
NEEDLE HYPO 25X1 1.5 SAFETY (NEEDLE) ×2 IMPLANT
NS IRRIG 1000ML POUR BTL (IV SOLUTION) ×2 IMPLANT
PACK MINOR (CUSTOM PROCEDURE TRAY) ×2 IMPLANT
PAD ARMBOARD 7.5X6 YLW CONV (MISCELLANEOUS) ×2 IMPLANT
SET BASIN LINEN APH (SET/KITS/TRAYS/PACK) ×2 IMPLANT
SUT ETHILON 3 0 FSL (SUTURE) IMPLANT
SUT PROLENE 4 0 PS 2 18 (SUTURE) IMPLANT
SUT VIC AB 3-0 SH 27 (SUTURE) ×1
SUT VIC AB 3-0 SH 27X BRD (SUTURE) ×1 IMPLANT
SUT VIC AB 4-0 PS2 27 (SUTURE) ×2 IMPLANT
SYR CONTROL 10ML LL (SYRINGE) ×2 IMPLANT

## 2012-05-26 NOTE — Anesthesia Preprocedure Evaluation (Addendum)
Anesthesia Evaluation  Patient identified by MRN, date of birth, ID band Patient awake    Reviewed: Allergy & Precautions, H&P , NPO status , Patient's Chart, lab work & pertinent test results  Airway Mallampati: III TM Distance: >3 FB Neck ROM: Full    Dental  (+) Teeth Intact   Pulmonary neg pulmonary ROS,  breath sounds clear to auscultation        Cardiovascular hypertension, Pt. on medications Rhythm:Regular Rate:Normal     Neuro/Psych PSYCHIATRIC DISORDERS Anxiety Depression    GI/Hepatic hiatal hernia, GERD-  Medicated and Controlled,  Endo/Other  diabetesHypothyroidism   Renal/GU      Musculoskeletal   Abdominal   Peds  Hematology   Anesthesia Other Findings   Reproductive/Obstetrics                           Anesthesia Physical Anesthesia Plan  ASA: III  Anesthesia Plan: General   Post-op Pain Management:    Induction: Intravenous, Rapid sequence and Cricoid pressure planned  Airway Management Planned: Oral ETT  Additional Equipment:   Intra-op Plan:   Post-operative Plan: Extubation in OR  Informed Consent: I have reviewed the patients History and Physical, chart, labs and discussed the procedure including the risks, benefits and alternatives for the proposed anesthesia with the patient or authorized representative who has indicated his/her understanding and acceptance.     Plan Discussed with:   Anesthesia Plan Comments:         Anesthesia Quick Evaluation

## 2012-05-26 NOTE — Op Note (Signed)
Patient:  Holly Baldwin  DOB:  [birth year]  MRN:  [id number]   Preop Diagnosis:  Neoplasm, right thigh  Postop Diagnosis:  Same  Procedure:  Excision of neoplasm, right thigh  Surgeon:  Aviva Signs, M.D.  Anes:  General endotracheal  Indications:  Patient is a 67 year old black female presents with an enlarging mass in the subcutaneous tissue of the right thigh. It is tender to touch. The risks and benefits of the procedure were fully explained to the patient, gave informed consent.  Procedure note:  The patient is placed the supine position. After induction of general endotracheal anesthesia, the right eye was prepped and draped using usual sterile technique with DuraPrep. Surgical site confirmation was performed.  An ovoid subcutaneous mass was noted in the anterior right upper thigh. It measured approximately 4-5 cm in its greatest diameter. An incision was made over this the dissection was taken down to the mass. It appeared to be a lipoma. It was just above the fascia. A bleeding was controlled using Bovie electrocautery. The specimen was sent to pathology for further examination. The subcutaneous layer was reapproximated using 3-0 Vicryl interrupted suture. The skin was closed using a 4-0 Vicryl subcuticular suture. 0.5% Sensorcaine was instilled the surrounding wound. Dermabond was then applied.  All tape and needle counts were correct at the end of the procedure. The patient was extubated in the operating room and transferred to PACU in stable condition.  Complications:  None  EBL:  Minimal  Specimen:  Neoplasm, right thigh

## 2012-05-26 NOTE — Transfer of Care (Signed)
Immediate Anesthesia Transfer of Care Note  Patient: Holly Baldwin  Procedure(s) Performed: Procedure(s): EXCISION NEOPLASM RIGHT THIGH (Right)  Patient Location: PACU  Anesthesia Type:General  Level of Consciousness: sedated  Airway & Oxygen Therapy: Patient Spontanous Breathing and Patient connected to face mask oxygen  Post-op Assessment: Report given to PACU RN  Post vital signs: Reviewed and stable  Complications: No apparent anesthesia complications

## 2012-05-26 NOTE — Interval H&P Note (Signed)
History and Physical Interval Note:  05/26/2012 8:26 AM  Holly Baldwin  has presented today for surgery, with the diagnosis of neoplasm right thigh  The various methods of treatment have been discussed with the patient and family. After consideration of risks, benefits and other options for treatment, the patient has consented to  Procedure(s): EXCISION NEOPLASM RIGHT THIGH (Right) as a surgical intervention .  The patient's history has been reviewed, patient examined, no change in status, stable for surgery.  I have reviewed the patient's chart and labs.  Questions were answered to the patient's satisfaction.     Aviva Signs A

## 2012-05-26 NOTE — Anesthesia Procedure Notes (Addendum)
Procedure Name: Intubation Date/Time: 05/26/2012 8:49 AM Performed by: Tressie Stalker E Pre-anesthesia Checklist: Patient identified, Patient being monitored, Timeout performed, Emergency Drugs available and Suction available Patient Re-evaluated:Patient Re-evaluated prior to inductionOxygen Delivery Method: Circle System Utilized Preoxygenation: Pre-oxygenation with 100% oxygen Intubation Type: IV induction, Rapid sequence and Cricoid Pressure applied Ventilation: Mask ventilation without difficulty Laryngoscope Size: Mac and 3 Grade View: Grade I Tube type: Oral Tube size: 7.0 mm Number of attempts: 1 Airway Equipment and Method: stylet Placement Confirmation: ETT inserted through vocal cords under direct vision,  positive ETCO2 and breath sounds checked- equal and bilateral Secured at: 21 cm Tube secured with: Tape Dental Injury: Teeth and Oropharynx as per pre-operative assessment

## 2012-05-26 NOTE — Anesthesia Postprocedure Evaluation (Signed)
  Anesthesia Post-op Note  Patient: Holly Baldwin  Procedure(s) Performed: Procedure(s): EXCISION NEOPLASM RIGHT THIGH (Right)  Patient Location: PACU  Anesthesia Type:General  Level of Consciousness: awake  Airway and Oxygen Therapy: Patient Spontanous Breathing and Patient connected to face mask oxygen  Post-op Pain: none  Post-op Assessment: Post-op Vital signs reviewed, Patient's Cardiovascular Status Stable, Respiratory Function Stable, Patent Airway and No signs of Nausea or vomiting  Post-op Vital Signs: Reviewed and stable  Complications: No apparent anesthesia complications

## 2012-05-27 LAB — GLUCOSE, CAPILLARY: Glucose-Capillary: 256 mg/dL — ABNORMAL HIGH (ref 70–99)

## 2012-05-28 ENCOUNTER — Encounter (HOSPITAL_COMMUNITY): Payer: Self-pay | Admitting: General Surgery

## 2012-05-31 ENCOUNTER — Encounter (INDEPENDENT_AMBULATORY_CARE_PROVIDER_SITE_OTHER): Payer: Self-pay | Admitting: *Deleted

## 2012-06-02 ENCOUNTER — Encounter (HOSPITAL_COMMUNITY): Payer: Self-pay | Admitting: *Deleted

## 2012-06-02 ENCOUNTER — Telehealth (INDEPENDENT_AMBULATORY_CARE_PROVIDER_SITE_OTHER): Payer: Self-pay | Admitting: *Deleted

## 2012-06-02 ENCOUNTER — Encounter (INDEPENDENT_AMBULATORY_CARE_PROVIDER_SITE_OTHER): Payer: Self-pay | Admitting: Internal Medicine

## 2012-06-02 ENCOUNTER — Ambulatory Visit (INDEPENDENT_AMBULATORY_CARE_PROVIDER_SITE_OTHER): Payer: Medicare Other | Admitting: Internal Medicine

## 2012-06-02 ENCOUNTER — Ambulatory Visit (HOSPITAL_COMMUNITY)
Admission: RE | Admit: 2012-06-02 | Discharge: 2012-06-02 | Disposition: A | Discharge status: Home / Self Care | Payer: Medicare Other | Source: Ambulatory Visit | Attending: Internal Medicine | Admitting: Internal Medicine

## 2012-06-02 ENCOUNTER — Other Ambulatory Visit (INDEPENDENT_AMBULATORY_CARE_PROVIDER_SITE_OTHER): Payer: Self-pay | Admitting: *Deleted

## 2012-06-02 ENCOUNTER — Other Ambulatory Visit: Payer: Self-pay

## 2012-06-02 ENCOUNTER — Emergency Department (HOSPITAL_COMMUNITY)
Admission: EM | Admit: 2012-06-02 | Discharge: 2012-06-02 | Disposition: A | Discharge status: Home / Self Care | Payer: Medicare Other | Attending: Emergency Medicine | Admitting: Emergency Medicine

## 2012-06-02 VITALS — BP 178/80 | HR 100 | Ht 64.0 in | Wt 221.0 lb

## 2012-06-02 DIAGNOSIS — K5900 Constipation, unspecified: Secondary | ICD-10-CM | POA: Insufficient documentation

## 2012-06-02 DIAGNOSIS — K219 Gastro-esophageal reflux disease without esophagitis: Secondary | ICD-10-CM | POA: Insufficient documentation

## 2012-06-02 DIAGNOSIS — E119 Type 2 diabetes mellitus without complications: Secondary | ICD-10-CM | POA: Insufficient documentation

## 2012-06-02 DIAGNOSIS — F411 Generalized anxiety disorder: Secondary | ICD-10-CM | POA: Insufficient documentation

## 2012-06-02 DIAGNOSIS — E7800 Pure hypercholesterolemia, unspecified: Secondary | ICD-10-CM | POA: Insufficient documentation

## 2012-06-02 DIAGNOSIS — Z1211 Encounter for screening for malignant neoplasm of colon: Secondary | ICD-10-CM

## 2012-06-02 DIAGNOSIS — Z79899 Other long term (current) drug therapy: Secondary | ICD-10-CM | POA: Insufficient documentation

## 2012-06-02 DIAGNOSIS — Z7982 Long term (current) use of aspirin: Secondary | ICD-10-CM | POA: Insufficient documentation

## 2012-06-02 DIAGNOSIS — R109 Unspecified abdominal pain: Secondary | ICD-10-CM

## 2012-06-02 DIAGNOSIS — Z794 Long term (current) use of insulin: Secondary | ICD-10-CM | POA: Insufficient documentation

## 2012-06-02 DIAGNOSIS — R140 Abdominal distension (gaseous): Secondary | ICD-10-CM

## 2012-06-02 DIAGNOSIS — R1013 Epigastric pain: Secondary | ICD-10-CM

## 2012-06-02 DIAGNOSIS — Z8719 Personal history of other diseases of the digestive system: Secondary | ICD-10-CM | POA: Insufficient documentation

## 2012-06-02 DIAGNOSIS — I10 Essential (primary) hypertension: Secondary | ICD-10-CM | POA: Insufficient documentation

## 2012-06-02 DIAGNOSIS — R141 Gas pain: Secondary | ICD-10-CM

## 2012-06-02 LAB — GLUCOSE, CAPILLARY: Glucose-Capillary: 260 mg/dL — ABNORMAL HIGH (ref 70–99)

## 2012-06-02 LAB — CBC WITH DIFFERENTIAL/PLATELET
Basophils Absolute: 0 10*3/uL (ref 0.0–0.1)
Basophils Absolute: 0 10*3/uL (ref 0.0–0.1)
Basophils Relative: 0 % (ref 0–1)
Basophils Relative: 0 % (ref 0–1)
Eosinophils Absolute: 0 10*3/uL (ref 0.0–0.7)
Eosinophils Absolute: 0 10*3/uL (ref 0.0–0.7)
Eosinophils Relative: 0 % (ref 0–5)
Eosinophils Relative: 0 % (ref 0–5)
HCT: 43.3 % (ref 36.0–46.0)
HCT: 42.7 % (ref 36.0–46.0)
Hemoglobin: 14.3 g/dL (ref 12.0–15.0)
Hemoglobin: 14.3 g/dL (ref 12.0–15.0)
Lymphocytes Relative: 14 % (ref 12–46)
Lymphocytes Relative: 13 % (ref 12–46)
Lymphs Abs: 1.4 10*3/uL (ref 0.7–4.0)
Lymphs Abs: 1.3 10*3/uL (ref 0.7–4.0)
MCH: 26.2 pg (ref 26.0–34.0)
MCH: 26.6 pg (ref 26.0–34.0)
MCHC: 33 g/dL (ref 30.0–36.0)
MCHC: 33.5 g/dL (ref 30.0–36.0)
MCV: 79.3 fL (ref 78.0–100.0)
MCV: 79.4 fL (ref 78.0–100.0)
Monocytes Absolute: 0.6 10*3/uL (ref 0.1–1.0)
Monocytes Absolute: 0.7 10*3/uL (ref 0.1–1.0)
Monocytes Relative: 6 % (ref 3–12)
Monocytes Relative: 7 % (ref 3–12)
Neutro Abs: 7.8 10*3/uL — ABNORMAL HIGH (ref 1.7–7.7)
Neutro Abs: 7.9 10*3/uL — ABNORMAL HIGH (ref 1.7–7.7)
Neutrophils Relative %: 80 % — ABNORMAL HIGH (ref 43–77)
Neutrophils Relative %: 80 % — ABNORMAL HIGH (ref 43–77)
Platelets: 326 10*3/uL (ref 150–400)
Platelets: 314 10*3/uL (ref 150–400)
RBC: 5.46 MIL/uL — ABNORMAL HIGH (ref 3.87–5.11)
RBC: 5.38 MIL/uL — ABNORMAL HIGH (ref 3.87–5.11)
RDW: 15.6 % — ABNORMAL HIGH (ref 11.5–15.5)
RDW: 15.2 % (ref 11.5–15.5)
WBC: 9.7 10*3/uL (ref 4.0–10.5)
WBC: 9.9 10*3/uL (ref 4.0–10.5)

## 2012-06-02 LAB — COMPREHENSIVE METABOLIC PANEL
ALT: 27 U/L (ref 0–35)
ALT: 30 U/L (ref 0–35)
AST: 21 U/L (ref 0–37)
AST: 29 U/L (ref 0–37)
Albumin: 4.1 g/dL (ref 3.5–5.2)
Albumin: 3.8 g/dL (ref 3.5–5.2)
Alkaline Phosphatase: 138 U/L — ABNORMAL HIGH (ref 39–117)
Alkaline Phosphatase: 147 U/L — ABNORMAL HIGH (ref 39–117)
BUN: 10 mg/dL (ref 6–23)
BUN: 8 mg/dL (ref 6–23)
CO2: 26 mEq/L (ref 19–32)
CO2: 25 mEq/L (ref 19–32)
Calcium: 9.5 mg/dL (ref 8.4–10.5)
Calcium: 9.1 mg/dL (ref 8.4–10.5)
Chloride: 101 mEq/L (ref 96–112)
Chloride: 98 mEq/L (ref 96–112)
Creat: 0.66 mg/dL (ref 0.50–1.10)
Creatinine, Ser: 0.52 mg/dL (ref 0.50–1.10)
GFR calc Af Amer: >90 mL/min (ref >90)
GFR calc non Af Amer: >90 mL/min (ref >90)
Glucose, Bld: 332 mg/dL — ABNORMAL HIGH (ref 70–99)
Glucose, Bld: 289 mg/dL — ABNORMAL HIGH (ref 70–99)
Potassium: 4 mEq/L (ref 3.5–5.3)
Potassium: 3.3 mEq/L — ABNORMAL LOW (ref 3.5–5.1)
Sodium: 140 mEq/L (ref 135–145)
Sodium: 137 mEq/L (ref 135–145)
Total Bilirubin: 0.4 mg/dL (ref 0.3–1.2)
Total Bilirubin: 0.4 mg/dL (ref 0.3–1.2)
Total Protein: 6.8 g/dL (ref 6.0–8.3)
Total Protein: 7.3 g/dL (ref 6.0–8.3)

## 2012-06-02 LAB — URINALYSIS, ROUTINE W REFLEX MICROSCOPIC
Bilirubin Urine: NEGATIVE
Glucose, UA: >1000 mg/dL — AB
Ketones, ur: 15 mg/dL — AB
Leukocytes, UA: NEGATIVE
Nitrite: NEGATIVE
Protein, ur: >300 mg/dL — AB
Specific Gravity, Urine: 1.025 (ref 1.005–1.030)
Urobilinogen, UA: 1 mg/dL (ref 0.0–1.0)
pH: 6.5 (ref 5.0–8.0)

## 2012-06-02 LAB — LACTIC ACID, PLASMA
Lactic Acid, Venous: 3.2 mmol/L — ABNORMAL HIGH (ref 0.5–2.2)
Lactic Acid, Venous: 2.4 mmol/L — ABNORMAL HIGH (ref 0.5–2.2)

## 2012-06-02 LAB — URINE MICROSCOPIC-ADD ON

## 2012-06-02 LAB — AMYLASE: Amylase: 24 U/L (ref 0–105)

## 2012-06-02 LAB — LIPASE, BLOOD: Lipase: 10 U/L — ABNORMAL LOW (ref 11–59)

## 2012-06-02 LAB — LIPASE: Lipase: <10 U/L (ref 0–75)

## 2012-06-02 LAB — TROPONIN I: Troponin I: <0.3 ng/mL (ref <0.30)

## 2012-06-02 MED ORDER — ONDANSETRON HCL 4 MG PO TABS: 4.0000 mg | ORAL_TABLET | Freq: Four times a day (QID) | ORAL | Status: DC

## 2012-06-02 MED ORDER — OMEPRAZOLE 20 MG PO CPDR: 20.0000 mg | DELAYED_RELEASE_CAPSULE | Freq: Every day | ORAL | Status: DC

## 2012-06-02 MED ORDER — CLONIDINE HCL 0.2 MG PO TABS
0.2000 mg | ORAL_TABLET | Freq: Two times a day (BID) | ORAL | Status: DC
  Administered 2012-06-02: 0.2 mg via ORAL
  Filled 2012-06-02: qty 1

## 2012-06-02 MED ORDER — SODIUM CHLORIDE 0.9 % IV SOLN
Freq: Once | INTRAVENOUS | Status: AC
  Administered 2012-06-02: 16:00:00 via INTRAVENOUS

## 2012-06-02 MED ORDER — ACETAMINOPHEN 325 MG PO TABS
650.0000 mg | ORAL_TABLET | Freq: Once | ORAL | Status: AC
  Administered 2012-06-02: 650 mg via ORAL
  Filled 2012-06-02: qty 2

## 2012-06-02 MED ORDER — PROMETHAZINE HCL 25 MG/ML IJ SOLN
12.5000 mg | Freq: Once | INTRAMUSCULAR | Status: AC
  Administered 2012-06-02: 12.5 mg via INTRAVENOUS
  Filled 2012-06-02: qty 1

## 2012-06-02 MED ORDER — IOHEXOL 300 MG/ML  SOLN
100.0000 mL | Freq: Once | INTRAMUSCULAR | Status: AC | PRN
  Administered 2012-06-02: 100 mL via INTRAVENOUS

## 2012-06-02 MED ORDER — SODIUM CHLORIDE 0.9 % IV BOLUS (SEPSIS)
1000.0000 mL | Freq: Once | INTRAVENOUS | Status: AC
  Administered 2012-06-02: 1000 mL via INTRAVENOUS

## 2012-06-02 MED ORDER — PANTOPRAZOLE SODIUM 40 MG IV SOLR
40.0000 mg | Freq: Once | INTRAVENOUS | Status: AC
  Administered 2012-06-02: 40 mg via INTRAVENOUS
  Filled 2012-06-02: qty 40

## 2012-06-02 MED ORDER — GI COCKTAIL ~~LOC~~
30.0000 mL | Freq: Once | ORAL | Status: AC
  Administered 2012-06-02: 30 mL via ORAL
  Filled 2012-06-02: qty 30

## 2012-06-02 MED ORDER — MAGNESIUM CITRATE PO SOLN
1.0000 | Freq: Once | ORAL | Status: AC
  Administered 2012-06-02: 1 via ORAL
  Filled 2012-06-02: qty 296

## 2012-06-02 MED ORDER — ONDANSETRON 8 MG PO TBDP
8.0000 mg | ORAL_TABLET | Freq: Once | ORAL | Status: AC
Start: 2012-06-02 — End: 2012-06-02
  Administered 2012-06-02: 8 mg via ORAL
  Filled 2012-06-02: qty 1

## 2012-06-02 MED ORDER — ONDANSETRON HCL 4 MG/2ML IJ SOLN
4.0000 mg | Freq: Once | INTRAMUSCULAR | Status: AC
  Administered 2012-06-02: 4 mg via INTRAVENOUS
  Filled 2012-06-02: qty 2

## 2012-06-02 NOTE — Telephone Encounter (Signed)
Patient needs movi prep 

## 2012-06-02 NOTE — ED Notes (Signed)
Pt with abd cramping and constipation, small BM this morning per pt with hard stool, + nausea

## 2012-06-02 NOTE — Progress Notes (Signed)
Subjective:     Patient ID: Holly Baldwin, female   DOB: October 29, 1945, 67 y.o.   MRN: [id number]  HPI Referred to our office for epigastric pain. She tells me she has tenderness in her epigastric region and she has abdominal tenderness. She has had symptoms for 4-5 months. She tells me she is constipated. She tells me when she can't have a BM she has abdominal distention and has epigastric pain. There has been no weight loss. Appetite is good.  She does have some nausea. She has a BM about once a week. She takes Miralax BID for constipation. No melena or bright red rectal bleeding.  She is due for a colonoscopy per Dr. Olevia Perches notes in April   05/06/2012 EGD:  3 mm esophageal polyp ablated via cold biopsy.  Erosive antral gastritis but no evidence of peptic ulcer disease.  Incidental finding of two 5 mm submucosal lesions at antrum possibly lipomas.  Biopsy;. Distal esophageal biopsy shows changes consistent with GERD but no evidence of intestinal metaplasia H. Pylori positive and treated with Pylera.    Review of Systems     Objective:   Physical Exam  Filed Vitals:   06/02/12 1048  BP: 178/80  Pulse: 100  Height: 5\' 4"  (1.626 m)  Weight: 221 lb (100.245 kg)  Alert and oriented. Skin warm and dry. Oral mucosa is moist.   . Sclera anicteric, conjunctivae is pink. Thyroid not enlarged. No cervical lymphadenopathy. Lungs clear. Heart regular rate and rhythm.  Abdomen is soft. Bowel sounds are positive. Unable to palpate liver. No abdominal masses felt. Epigastric tenderness. Upper abdomen is distended and firm to the touch. Appears to have a ventral hernia,  No edema to lower extremities.       Assessment:    Epigastric tenderness with upper abdominal  distention. Acute abdomen needs to be ruled out. I discussed this case with Dr Laural Golden. Hx of cholecystectomy    Plan:    Amylase, Lipase, CBC, CMet, CT abdomen/pelvis with CM today

## 2012-06-02 NOTE — ED Provider Notes (Signed)
History  This chart was scribed for Holly Essex, MD by Roe Coombs, ED Scribe. The patient was seen in room [facility]05/[facility]05. Patient's care was started at 1508.  CSN: [id number]  Arrival date & time 06/02/12  1458   First MD Initiated Contact with Patient 06/02/12 1508      Chief Complaint  Patient presents with  . Abdominal Pain  . Constipation    The history is provided by the patient. No language interpreter was used.    HPI Comments: Holly Baldwin is a 67 y.o. female with history of hypertension, diabetes, cholecystectomy, appendectomy, hysterectomy who presents to the Emergency Department complaining of constant, moderate epigastric abdominal cramping and constipation onset 2 weeks ago. Patient states that her last BM was this morning, but there was only a small amount of very hard stool. Patient denies any relieving factors. She believes that the Miralax she is taking aggravates her stomach pain, rather than providing relief from symptoms, though patient has also been taking Excedrin Migraine. There is associated nausea, decreased appetite, intermittent diaphoresis, back pain and intermittent body aches. She denies vomiting, fever, chills, headaches, difficulty urinating, or rash. Patient saw her GI today, but did not have labs or imaging done. She says that she takes Vicodin once a day to manage pain. She also takes Tylenol, but denies use of NSAIDs frequently. Patient had a recent endoscopy with impression of acid reflux, but no other acute or chronic conditions. She says that she has a colonoscopy scheduled for April 23. Patient does not drink alcohol or smoke.  Past Medical History  Diagnosis Date  . Hypertension   . Diabetes mellitus   . Hypercholesteremia   . Anxiety   . GERD (gastroesophageal reflux disease)   . Hiatal hernia     Past Surgical History  Procedure Laterality Date  . Abdominal hysterectomy    . Esophagogastroduodenoscopy N/A 05/06/2012    Procedure:  ESOPHAGOGASTRODUODENOSCOPY (EGD);  Surgeon: Rogene Houston, MD;  Location: [facility];  Service: Endoscopy;  Laterality: N/A;  . Mass excision Right 05/26/2012    Procedure: EXCISION NEOPLASM RIGHT THIGH;  Surgeon: Jamesetta So, MD;  Location: [facility];  Service: General;  Laterality: Right;    History reviewed. No pertinent family history.  History  Substance Use Topics  . Smoking status: Never Smoker   . Smokeless tobacco: Not on file  . Alcohol Use: No    OB History   Grav Para Term Preterm Abortions TAB SAB Ect Mult Living                  Review of Systems A complete 10 system review of systems was obtained and all systems are negative except as noted in the HPI and PMH.   Allergies  Bee venom and Penicillins  Home Medications   Current Outpatient Rx  Name  Route  Sig  Dispense  Refill  . amLODipine-olmesartan (AZOR) 10-40 MG per tablet   Oral   Take 1 tablet by mouth every morning.          Marland Kitchen aspirin EC 81 MG tablet   Oral   Take 81 mg by mouth every morning.         . bismuth-metronidazole-tetracycline (PLYERA) 140-125-125 MG per capsule   Oral   Take 3 capsules by mouth 4 (four) times daily.         . cloNIDine (CATAPRES) 0.2 MG tablet   Oral   Take 0.2 mg by mouth 2 (two) times  daily.          . cycloSPORINE (RESTASIS) 0.05 % ophthalmic emulsion   Both Eyes   Place 1 drop into both eyes daily.         Marland Kitchen EPINEPHrine (EPI-PEN) 0.3 mg/0.3 mL DEVI   Intramuscular   Inject 0.3 mg into the muscle once.         Marland Kitchen FLUoxetine (PROZAC) 20 MG capsule   Oral   Take 20 mg by mouth every morning.          . furosemide (LASIX) 20 MG tablet   Oral   Take 20 mg by mouth every morning.         Marland Kitchen HYDROcodone-acetaminophen (VICODIN) 5-500 MG per tablet   Oral   Take 1 tablet by mouth every 6 (six) hours as needed for pain.   30 tablet   0   . insulin aspart (NOVOLOG FLEXPEN) 100 UNIT/ML injection   Subcutaneous   Inject 25 Units into the  skin 3 (three) times daily before meals.          . insulin glargine (LANTUS) 100 UNIT/ML injection   Subcutaneous   Inject 80 Units into the skin at bedtime.   10 mL   0   . levothyroxine (SYNTHROID, LEVOTHROID) 25 MCG tablet   Oral   Take 25 mcg by mouth every morning.          Marland Kitchen omeprazole (PRILOSEC) 40 MG capsule   Oral   Take 40 mg by mouth every morning.          . polyethylene glycol (MIRALAX / GLYCOLAX) packet   Oral   Take 17 g by mouth daily.         . simvastatin (ZOCOR) 40 MG tablet   Oral   Take 40 mg by mouth every evening.         . sucralfate (CARAFATE) 1 GM/10ML suspension   Oral   Take 1 g by mouth 4 (four) times daily.         . ziprasidone (GEODON) 80 MG capsule   Oral   Take 80 mg by mouth at bedtime.           Marland Kitchen zolpidem (AMBIEN) 5 MG tablet   Oral   Take 5 mg by mouth at bedtime as needed for sleep.         Marland Kitchen omeprazole (PRILOSEC) 20 MG capsule   Oral   Take 1 capsule (20 mg total) by mouth daily.   30 capsule   0   . ondansetron (ZOFRAN) 4 MG tablet   Oral   Take 1 tablet (4 mg total) by mouth every 6 (six) hours.   12 tablet   0     Triage Vitals: BP 164/109  Pulse 109  Temp(Src) 99 F (37.2 C) (Oral)  Resp 18  Ht 5\' 4"  (1.626 m)  Wt 221 lb (100.245 kg)  BMI 37.92 kg/m2  SpO2 98%  Physical Exam  Constitutional: She is oriented to person, place, and time. She appears well-developed and well-nourished.  HENT:  Head: Normocephalic and atraumatic.  Eyes: Conjunctivae and EOM are normal. Pupils are equal, round, and reactive to light.  Neck: Normal range of motion. Neck supple.  Cardiovascular: Normal rate, regular rhythm and normal heart sounds.   Pulmonary/Chest: Effort normal and breath sounds normal.  Abdominal: Soft. She exhibits distension. There is tenderness in the epigastric area. There is no rebound and no guarding.  Reducible ventral hernia.  Genitourinary: Guaiac positive stool.  Rectal exam shows  no hemorrhoids, no fecal impaction. Chaperone present.   Musculoskeletal: Normal range of motion.  Neurological: She is alert and oriented to person, place, and time.  Skin: Skin is warm and dry.    ED Course  Procedures (including critical care time) DIAGNOSTIC STUDIES: Oxygen Saturation is 98% on room air, normal by my interpretation.    COORDINATION OF CARE: 3:20 PM- Patient informed of current plan for treatment and evaluation and agrees with plan at this time.   5:22 PM- Patient is complaining of nausea or diaphoresis. She also states that she is having constant headache previously unreported. Updated patient on labs and CT results. Advised patient to keep colonoscopy appointment and continue to follow up with Dr. Laural Golden regarding ongoing treatment.   7:58 PM- Labs are unremarkable and CT is unchanged from previous. Advised patient to take Miralax twice a day. Will send patient home with anti-nausea medicines. Patient is still complaining of epigastric abdominal comfort. Is requesting BP medicines in the ED and to have her CBG checked prior to discharge.   Labs Reviewed  CBC WITH DIFFERENTIAL - Abnormal; Notable for the following:    RBC 5.38 (*)    Neutrophils Relative 80 (*)    Neutro Abs 7.9 (*)    All other components within normal limits  COMPREHENSIVE METABOLIC PANEL - Abnormal; Notable for the following:    Potassium 3.3 (*)    Glucose, Bld 289 (*)    Alkaline Phosphatase 147 (*)    All other components within normal limits  LIPASE, BLOOD - Abnormal; Notable for the following:    Lipase 10 (*)    All other components within normal limits  LACTIC ACID, PLASMA - Abnormal; Notable for the following:    Lactic Acid, Venous 3.2 (*)    All other components within normal limits  LACTIC ACID, PLASMA - Abnormal; Notable for the following:    Lactic Acid, Venous 2.4 (*)    All other components within normal limits  TROPONIN I    Ct Abdomen Pelvis W Contrast  06/02/2012   *RADIOLOGY REPORT*  Clinical Data: Abdominal pain, epigastric pain, and abdominal distention, history hypertension, diabetes, hypercholesterolemia, GERD, hiatal hernia  CT ABDOMEN AND PELVIS WITH CONTRAST  Technique:  Multidetector CT imaging of the abdomen and pelvis was performed following the standard protocol during bolus administration of intravenous contrast. Sagittal and coronal MPR images reconstructed from axial data set.  Contrast: 141mL OMNIPAQUE IOHEXOL 300 MG/ML  SOLN Dilute oral contrast.  Comparison: 05/05/2012, 01/17/2011 Correlation:  MRA abdomen 06/30/2007  Findings: Minimal atelectasis or scarring at lung bases. Diffuse fatty infiltration of liver. Nonspecific low attenuation focus posterior right lobe liver 1.6 x 1.4 cm image 15. Gallbladder surgically absent. Probable tiny parenchymal calcification in left kidney with tiny bilateral renal cysts. Remainder of liver, spleen, pancreas, kidneys, and adrenal glands unremarkable.  Uterus surgically absent with normal sized ovaries. Appendix not definitely visualized; no pericecal inflammatory process identified. Stomach and bowel loops normal appearance. Unremarkable bladder and ureters. Scattered pelvic phleboliths. No mass, free air, free fluid, or inflammatory process.  Nonspecific minimally enlarged para esophageal lymph node at inferior mediastinum 13 x 12 mm image 4. No additional intra-abdominal or intrapelvic adenopathy. No acute osseous findings.  IMPRESSION: Nonspecific minimally enlarged para esophageal lymph node at inferior mediastinum. Fatty infiltration of liver. Nonspecific low attenuation focus right lobe liver 1.6 x 1.4 cm, minimally larger than on previous exam when it measured 15 x  10 mm. This lesion was present on a prior MRI from 2009 when measured 1.6 x 1.2 cm (series 6 image 38). No definite acute intra abdominal or intrapelvic abnormalities identified.   Original Report Authenticated By: Lavonia Dana, M.D.      1. Abdominal  pain       MDM  Epigastric pain, abdominal distention and constipation for the past several weeks. Admitted March 6 with similar symptoms and underwent EGD. Saw GI today and had CT scan is scheduled for colonoscopy.  EGD 3/6: EGD performed. Results 64mm esophageal polyp ablated via cold biopsy. Erosive antral gastritis but no evidence of peptic ulcer disease. Incidental finding of two 5 mm submucosal lesions at antrum possibly lipomas. Pt tolerated procedure well and ate full carb modified diet afterwards without incident  Patient had admission in March 5-6 for similar symptoms. Her pain was attributed to gastritis.  Her lab work today is unremarkable except for a lactate of 3.2. She is given IV hydration and antiemetics in ED. There is no evidence of impaction. CT scan ordered as an outpatient reviewed and negative for acute pathology. Repeat lactate 2.4 after hydration. Doubt mesenteric ischemia. Patient's pain is controlled she's had no vomiting in ED.  Stressed compliance with her MiraLAX regimen and follow up with GI for colonoscopy as scheduled.   Date: 06/02/2012 16:58  Rate: 104  Rhythm: sinus tachycardia  QRS Axis: normal  Intervals: PR prolonged  ST/T Wave abnormalities: nonspecific ST/T changes  Conduction Disutrbances:none  Narrative Interpretation:   Old EKG Reviewed: unchanged    Date: 06/02/2012 21:14  Rate: 98  Rhythm: normal sinus rhythm  QRS Axis: normal  Intervals: normal  ST/T Wave abnormalities: nonspecific ST/T changes  Conduction Disutrbances:none  Narrative Interpretation:   Old EKG Reviewed: unchanged     I personally performed the services described in this documentation, which was scribed in my presence. The recorded information has been reviewed and is accurate.    Holly Essex, MD 06/03/12 Laureen Abrahams

## 2012-06-02 NOTE — ED Notes (Signed)
Pt ambulated to restroom. Steady gait at this time. Pt reports unable to have BM.

## 2012-06-02 NOTE — Progress Notes (Signed)
Blood sample obtained from left arm IV for Creatnine level.  

## 2012-06-02 NOTE — ED Notes (Addendum)
Pt states that CT of abdomen done today and Dr. Olevia Perches office instructed pt to be evaluated here due to abd cramping

## 2012-06-02 NOTE — Patient Instructions (Addendum)
Labs and CT.  

## 2012-06-02 NOTE — ED Notes (Signed)
Pt ambulated to the restroom without difficulty. Steady gait noted. Urine sample obtained.

## 2012-06-03 ENCOUNTER — Ambulatory Visit (HOSPITAL_COMMUNITY): Payer: Medicare Other

## 2012-06-03 MED ORDER — PEG-KCL-NACL-NASULF-NA ASC-C 100 G PO SOLR: 1.0000 | Freq: Once | ORAL | Status: DC

## 2012-06-04 ENCOUNTER — Encounter (HOSPITAL_COMMUNITY): Payer: Self-pay | Admitting: Pharmacy Technician

## 2012-06-04 ENCOUNTER — Other Ambulatory Visit (INDEPENDENT_AMBULATORY_CARE_PROVIDER_SITE_OTHER): Payer: Self-pay | Admitting: Internal Medicine

## 2012-06-04 LAB — POCT I-STAT, CHEM 8
BUN: 10 mg/dL (ref 6–23)
Calcium, Ion: 1.07 mmol/L — ABNORMAL LOW (ref 1.13–1.30)
Chloride: 105 mEq/L (ref 96–112)
Creatinine, Ser: 0.6 mg/dL (ref 0.50–1.10)
Glucose, Bld: 341 mg/dL — ABNORMAL HIGH (ref 70–99)
HCT: 47 % — ABNORMAL HIGH (ref 36.0–46.0)
Hemoglobin: 16 g/dL — ABNORMAL HIGH (ref 12.0–15.0)
Potassium: 3.9 mEq/L (ref 3.5–5.1)
Sodium: 138 mEq/L (ref 135–145)
TCO2: 27 mmol/L (ref 0–100)

## 2012-06-23 ENCOUNTER — Encounter (HOSPITAL_COMMUNITY)
Admission: RE | Disposition: A | Discharge status: Home / Self Care | Payer: Self-pay | Source: Ambulatory Visit | Attending: Emergency Medicine

## 2012-06-23 ENCOUNTER — Ambulatory Visit (HOSPITAL_COMMUNITY)
Admission: RE | Admit: 2012-06-23 | Discharge: 2012-06-23 | Disposition: A | Discharge status: Home / Self Care | Payer: Medicare Other | Source: Ambulatory Visit | Attending: Emergency Medicine | Admitting: Emergency Medicine

## 2012-06-23 ENCOUNTER — Ambulatory Visit (HOSPITAL_COMMUNITY): Payer: Medicare Other

## 2012-06-23 ENCOUNTER — Encounter (HOSPITAL_COMMUNITY): Payer: Self-pay | Admitting: *Deleted

## 2012-06-23 DIAGNOSIS — R1013 Epigastric pain: Secondary | ICD-10-CM | POA: Insufficient documentation

## 2012-06-23 DIAGNOSIS — F411 Generalized anxiety disorder: Secondary | ICD-10-CM | POA: Insufficient documentation

## 2012-06-23 DIAGNOSIS — G8929 Other chronic pain: Secondary | ICD-10-CM | POA: Insufficient documentation

## 2012-06-23 DIAGNOSIS — Z01810 Encounter for preprocedural cardiovascular examination: Secondary | ICD-10-CM | POA: Insufficient documentation

## 2012-06-23 DIAGNOSIS — Z794 Long term (current) use of insulin: Secondary | ICD-10-CM | POA: Insufficient documentation

## 2012-06-23 DIAGNOSIS — E7800 Pure hypercholesterolemia, unspecified: Secondary | ICD-10-CM | POA: Insufficient documentation

## 2012-06-23 DIAGNOSIS — Z1211 Encounter for screening for malignant neoplasm of colon: Secondary | ICD-10-CM | POA: Insufficient documentation

## 2012-06-23 DIAGNOSIS — Z91038 Other insect allergy status: Secondary | ICD-10-CM | POA: Insufficient documentation

## 2012-06-23 DIAGNOSIS — Z01812 Encounter for preprocedural laboratory examination: Secondary | ICD-10-CM | POA: Insufficient documentation

## 2012-06-23 DIAGNOSIS — D126 Benign neoplasm of colon, unspecified: Secondary | ICD-10-CM

## 2012-06-23 DIAGNOSIS — E119 Type 2 diabetes mellitus without complications: Secondary | ICD-10-CM | POA: Insufficient documentation

## 2012-06-23 DIAGNOSIS — K449 Diaphragmatic hernia without obstruction or gangrene: Secondary | ICD-10-CM | POA: Insufficient documentation

## 2012-06-23 DIAGNOSIS — Z7982 Long term (current) use of aspirin: Secondary | ICD-10-CM | POA: Insufficient documentation

## 2012-06-23 DIAGNOSIS — R42 Dizziness and giddiness: Secondary | ICD-10-CM | POA: Insufficient documentation

## 2012-06-23 DIAGNOSIS — R110 Nausea: Secondary | ICD-10-CM | POA: Insufficient documentation

## 2012-06-23 DIAGNOSIS — Z880 Allergy status to penicillin: Secondary | ICD-10-CM | POA: Insufficient documentation

## 2012-06-23 DIAGNOSIS — I10 Essential (primary) hypertension: Secondary | ICD-10-CM | POA: Insufficient documentation

## 2012-06-23 DIAGNOSIS — K219 Gastro-esophageal reflux disease without esophagitis: Secondary | ICD-10-CM | POA: Insufficient documentation

## 2012-06-23 DIAGNOSIS — Z79899 Other long term (current) drug therapy: Secondary | ICD-10-CM | POA: Insufficient documentation

## 2012-06-23 HISTORY — PX: COLONOSCOPY: SHX5424

## 2012-06-23 HISTORY — DX: Other chronic pain: G89.29

## 2012-06-23 HISTORY — DX: Unspecified abdominal pain: R10.9

## 2012-06-23 LAB — TROPONIN I: Troponin I: <0.3 ng/mL (ref <0.30)

## 2012-06-23 LAB — CBC WITH DIFFERENTIAL/PLATELET
Basophils Absolute: 0 10*3/uL (ref 0.0–0.1)
Basophils Relative: 0 % (ref 0–1)
Eosinophils Absolute: 0 10*3/uL (ref 0.0–0.7)
Eosinophils Relative: 0 % (ref 0–5)
HCT: 40 % (ref 36.0–46.0)
Hemoglobin: 13.7 g/dL (ref 12.0–15.0)
Lymphocytes Relative: 13 % (ref 12–46)
Lymphs Abs: 1.1 10*3/uL (ref 0.7–4.0)
MCH: 26.9 pg (ref 26.0–34.0)
MCHC: 34.3 g/dL (ref 30.0–36.0)
MCV: 78.4 fL (ref 78.0–100.0)
Monocytes Absolute: 0.6 10*3/uL (ref 0.1–1.0)
Monocytes Relative: 8 % (ref 3–12)
Neutro Abs: 6.8 10*3/uL (ref 1.7–7.7)
Neutrophils Relative %: 79 % — ABNORMAL HIGH (ref 43–77)
Platelets: 304 10*3/uL (ref 150–400)
RBC: 5.1 MIL/uL (ref 3.87–5.11)
RDW: 14.9 % (ref 11.5–15.5)
WBC: 8.6 10*3/uL (ref 4.0–10.5)

## 2012-06-23 LAB — BASIC METABOLIC PANEL
BUN: 6 mg/dL (ref 6–23)
CO2: 25 mEq/L (ref 19–32)
Calcium: 8.7 mg/dL (ref 8.4–10.5)
Chloride: 101 mEq/L (ref 96–112)
Creatinine, Ser: 0.5 mg/dL (ref 0.50–1.10)
GFR calc Af Amer: >90 mL/min (ref >90)
GFR calc non Af Amer: >90 mL/min (ref >90)
Glucose, Bld: 261 mg/dL — ABNORMAL HIGH (ref 70–99)
Potassium: 3.2 mEq/L — ABNORMAL LOW (ref 3.5–5.1)
Sodium: 140 mEq/L (ref 135–145)

## 2012-06-23 LAB — URINALYSIS, ROUTINE W REFLEX MICROSCOPIC
Bilirubin Urine: NEGATIVE
Glucose, UA: 250 mg/dL — AB
Ketones, ur: 15 mg/dL — AB
Leukocytes, UA: NEGATIVE
Nitrite: NEGATIVE
Protein, ur: 100 mg/dL — AB
Specific Gravity, Urine: 1.025 (ref 1.005–1.030)
Urobilinogen, UA: 0.2 mg/dL (ref 0.0–1.0)
pH: 6 (ref 5.0–8.0)

## 2012-06-23 LAB — GLUCOSE, CAPILLARY
Glucose-Capillary: 302 mg/dL — ABNORMAL HIGH (ref 70–99)
Glucose-Capillary: 332 mg/dL — ABNORMAL HIGH (ref 70–99)

## 2012-06-23 LAB — URINE MICROSCOPIC-ADD ON

## 2012-06-23 SURGERY — COLONOSCOPY
Anesthesia: Moderate Sedation

## 2012-06-23 MED ORDER — AMLODIPINE BESYLATE 5 MG PO TABS
10.0000 mg | ORAL_TABLET | Freq: Every day | ORAL | Status: DC
  Administered 2012-06-23: 10 mg via ORAL
  Filled 2012-06-23: qty 2

## 2012-06-23 MED ORDER — ENALAPRILAT 1.25 MG/ML IV SOLN
1.2500 mg | Freq: Once | INTRAVENOUS | Status: AC
  Administered 2012-06-23: 1.25 mg via INTRAVENOUS

## 2012-06-23 MED ORDER — CLONIDINE HCL 0.2 MG PO TABS
0.2000 mg | ORAL_TABLET | Freq: Once | ORAL | Status: AC
  Administered 2012-06-23: 0.2 mg via ORAL
  Filled 2012-06-23: qty 1

## 2012-06-23 MED ORDER — MEPERIDINE HCL 50 MG/ML IJ SOLN
INTRAMUSCULAR | Status: AC
  Filled 2012-06-23: qty 1

## 2012-06-23 MED ORDER — MIDAZOLAM HCL 5 MG/5ML IJ SOLN
INTRAMUSCULAR | Status: DC | PRN
  Administered 2012-06-23 (×3): 2 mg via INTRAVENOUS

## 2012-06-23 MED ORDER — POTASSIUM CHLORIDE ER 10 MEQ PO TBCR: 20.0000 meq | EXTENDED_RELEASE_TABLET | Freq: Every day | ORAL | Status: DC

## 2012-06-23 MED ORDER — SODIUM CHLORIDE 0.9 % IV SOLN
INTRAVENOUS | Status: DC
  Administered 2012-06-23: 14:00:00 via INTRAVENOUS

## 2012-06-23 MED ORDER — SODIUM CHLORIDE 0.9 % IV BOLUS (SEPSIS)
500.0000 mL | Freq: Once | INTRAVENOUS | Status: AC
  Administered 2012-06-23: 14:00:00 via INTRAVENOUS

## 2012-06-23 MED ORDER — POTASSIUM CHLORIDE CRYS ER 20 MEQ PO TBCR: 20.0000 meq | EXTENDED_RELEASE_TABLET | Freq: Every day | ORAL | Status: DC

## 2012-06-23 MED ORDER — ALPRAZOLAM 0.5 MG PO TABS
0.5000 mg | ORAL_TABLET | Freq: Once | ORAL | Status: AC
  Administered 2012-06-23: 0.5 mg via ORAL
  Filled 2012-06-23: qty 1

## 2012-06-23 MED ORDER — ENALAPRILAT 1.25 MG/ML IV SOLN
INTRAVENOUS | Status: AC
  Filled 2012-06-23: qty 2

## 2012-06-23 MED ORDER — SODIUM CHLORIDE 0.9 % IV SOLN
INTRAVENOUS | Status: DC
  Administered 2012-06-23: 11:00:00 via INTRAVENOUS

## 2012-06-23 MED ORDER — MIDAZOLAM HCL 5 MG/5ML IJ SOLN
INTRAMUSCULAR | Status: AC
  Filled 2012-06-23: qty 10

## 2012-06-23 MED ORDER — ONDANSETRON HCL 4 MG/2ML IJ SOLN
4.0000 mg | INTRAMUSCULAR | Status: DC | PRN
  Administered 2012-06-23: 4 mg via INTRAVENOUS
  Filled 2012-06-23: qty 2

## 2012-06-23 MED ORDER — MEPERIDINE HCL 50 MG/ML IJ SOLN
INTRAMUSCULAR | Status: DC | PRN
  Administered 2012-06-23 (×2): 25 mg via INTRAVENOUS

## 2012-06-23 MED ORDER — INSULIN ASPART 100 UNIT/ML ~~LOC~~ SOLN
10.0000 [IU] | Freq: Once | SUBCUTANEOUS | Status: AC
  Administered 2012-06-23: 10 [IU] via SUBCUTANEOUS
  Filled 2012-06-23: qty 3

## 2012-06-23 NOTE — H&P (Signed)
Holly Baldwin is an 67 y.o. female.   Chief Complaint: Patient is here for colonoscopy. HPI: Patient is 67 year old African female who is here for average risk screening colonoscopy patient came to this facility earlier today for blood pressure was elevated and she was quite anxious and glucose over 300. She was therefore evaluated in emergency room and no acute abnormalities noted. Blood pressure is coming down with medications. She is deemed to be stable for colonoscopy. She complains of intermittent epigastric pain. She was receiving diagnosed with H. pylori gastritis but has not taken Pylera yet. She denies rectal bleeding or recent change in her bowel habits. Family history is negative for colorectal carcinoma.  Past Medical History  Diagnosis Date  . Hypertension   . Diabetes mellitus   . Hypercholesteremia   . Anxiety   . GERD (gastroesophageal reflux disease)   . Hiatal hernia   . Chronic abdominal pain     Past Surgical History  Procedure Laterality Date  . Abdominal hysterectomy    . Esophagogastroduodenoscopy N/A 05/06/2012    Procedure: ESOPHAGOGASTRODUODENOSCOPY (EGD);  Surgeon: Rogene Houston, MD;  Location: [facility];  Service: Endoscopy;  Laterality: N/A;  . Mass excision Right 05/26/2012    Procedure: EXCISION NEOPLASM RIGHT THIGH;  Surgeon: Jamesetta So, MD;  Location: [facility];  Service: General;  Laterality: Right;    Family History  Problem Relation Age of Onset  . Colon cancer Neg Hx   . Colon polyps Neg Hx    Social History:  reports that she has never smoked. She does not have any smokeless tobacco history on file. She reports that she does not drink alcohol or use illicit drugs.  Allergies:  Allergies  Allergen Reactions  . Bee Venom Anaphylaxis  . Penicillins Hives    Medications Prior to Admission  Medication Sig Dispense Refill  . furosemide (LASIX) 20 MG tablet Take 20 mg by mouth every morning.      . peg 3350 powder (MOVIPREP) 100 G SOLR  Take 1 kit (100 g total) by mouth once.  1 kit  0  . amLODipine-olmesartan (AZOR) 10-40 MG per tablet Take 1 tablet by mouth every morning.       Marland Kitchen aspirin EC 81 MG tablet Take 81 mg by mouth every morning.      . cloNIDine (CATAPRES) 0.2 MG tablet Take 0.2 mg by mouth 2 (two) times daily.       . cycloSPORINE (RESTASIS) 0.05 % ophthalmic emulsion Place 1 drop into both eyes daily.      Marland Kitchen EPINEPHrine (EPI-PEN) 0.3 mg/0.3 mL DEVI Inject 0.3 mg into the muscle once.      Marland Kitchen FLUoxetine (PROZAC) 20 MG capsule Take 20 mg by mouth every morning.       Marland Kitchen HYDROcodone-acetaminophen (VICODIN) 5-500 MG per tablet Take 1 tablet by mouth every 6 (six) hours as needed for pain.  30 tablet  0  . insulin aspart (NOVOLOG FLEXPEN) 100 UNIT/ML injection Inject 25 Units into the skin 3 (three) times daily before meals.       . insulin glargine (LANTUS) 100 UNIT/ML injection Inject 80 Units into the skin at bedtime.  10 mL  0  . levothyroxine (SYNTHROID, LEVOTHROID) 25 MCG tablet Take 25 mcg by mouth every morning.       Marland Kitchen omeprazole (PRILOSEC) 20 MG capsule Take 1 capsule (20 mg total) by mouth daily.  30 capsule  0  . ondansetron (ZOFRAN) 4 MG tablet Take 1  tablet (4 mg total) by mouth every 6 (six) hours.  12 tablet  0  . polyethylene glycol (MIRALAX / GLYCOLAX) packet Take 17 g by mouth daily.      Marland Kitchen PYLERA 140-125-125 MG per capsule TAKE 3 CAPSULES BY MOUTH 4 TIMES DAILY AS DIRECTED.  120 capsule  0  . simvastatin (ZOCOR) 40 MG tablet Take 40 mg by mouth every evening.      . sucralfate (CARAFATE) 1 GM/10ML suspension Take 1 g by mouth 4 (four) times daily.      . ziprasidone (GEODON) 80 MG capsule Take 80 mg by mouth at bedtime.        Marland Kitchen zolpidem (AMBIEN) 5 MG tablet Take 5 mg by mouth at bedtime as needed for sleep.        Results for orders placed during the hospital encounter of 06/23/12 (from the past 48 hour(s))  GLUCOSE, CAPILLARY     Status: Abnormal   Collection Time    06/23/12 10:23 AM      Result  Value Range   Glucose-Capillary 302 (*) 70 - 99 mg/dL  GLUCOSE, CAPILLARY     Status: Abnormal   Collection Time    06/23/12 11:05 AM      Result Value Range   Glucose-Capillary 332 (*) 70 - 99 mg/dL  CBC WITH DIFFERENTIAL     Status: Abnormal   Collection Time    06/23/12  1:36 PM      Result Value Range   WBC 8.6  4.0 - 10.5 K/uL   RBC 5.10  3.87 - 5.11 MIL/uL   Hemoglobin 13.7  12.0 - 15.0 g/dL   HCT 40.0  36.0 - 46.0 %   MCV 78.4  78.0 - 100.0 fL   MCH 26.9  26.0 - 34.0 pg   MCHC 34.3  30.0 - 36.0 g/dL   RDW 14.9  11.5 - 15.5 %   Platelets 304  150 - 400 K/uL   Neutrophils Relative 79 (*) 43 - 77 %   Neutro Abs 6.8  1.7 - 7.7 K/uL   Lymphocytes Relative 13  12 - 46 %   Lymphs Abs 1.1  0.7 - 4.0 K/uL   Monocytes Relative 8  3 - 12 %   Monocytes Absolute 0.6  0.1 - 1.0 K/uL   Eosinophils Relative 0  0 - 5 %   Eosinophils Absolute 0.0  0.0 - 0.7 K/uL   Basophils Relative 0  0 - 1 %   Basophils Absolute 0.0  0.0 - 0.1 K/uL  BASIC METABOLIC PANEL     Status: Abnormal   Collection Time    06/23/12  1:36 PM      Result Value Range   Sodium 140  135 - 145 mEq/L   Potassium 3.2 (*) 3.5 - 5.1 mEq/L   Chloride 101  96 - 112 mEq/L   CO2 25  19 - 32 mEq/L   Glucose, Bld 261 (*) 70 - 99 mg/dL   BUN 6  6 - 23 mg/dL   Creatinine, Ser 0.50  0.50 - 1.10 mg/dL   Calcium 8.7  8.4 - 10.5 mg/dL   GFR calc non Af Amer >90  >90 mL/min   GFR calc Af Amer >90  >90 mL/min   Comment:            The eGFR has been calculated     using the CKD EPI equation.     This calculation has not been  validated in all clinical     situations.     eGFR's persistently     <90 mL/min signify     possible Chronic Kidney Disease.  TROPONIN I     Status: None   Collection Time    06/23/12  1:36 PM      Result Value Range   Troponin I <0.30  <0.30 ng/mL   Comment:            Due to the release kinetics of cTnI,     a negative result within the first hours     of the onset of symptoms does not  rule out     myocardial infarction with certainty.     If myocardial infarction is still suspected,     repeat the test at appropriate intervals.  URINALYSIS, ROUTINE W REFLEX MICROSCOPIC     Status: Abnormal   Collection Time    06/23/12  3:42 PM      Result Value Range   Color, Urine YELLOW  YELLOW   APPearance CLEAR  CLEAR   Specific Gravity, Urine 1.025  1.005 - 1.030   pH 6.0  5.0 - 8.0   Glucose, UA 250 (*) NEGATIVE mg/dL   Hgb urine dipstick LARGE (*) NEGATIVE   Bilirubin Urine NEGATIVE  NEGATIVE   Ketones, ur 15 (*) NEGATIVE mg/dL   Protein, ur 100 (*) NEGATIVE mg/dL   Urobilinogen, UA 0.2  0.0 - 1.0 mg/dL   Nitrite NEGATIVE  NEGATIVE   Leukocytes, UA NEGATIVE  NEGATIVE  URINE MICROSCOPIC-ADD ON     Status: Abnormal   Collection Time    06/23/12  3:42 PM      Result Value Range   Squamous Epithelial / LPF MANY (*) RARE   WBC, UA 0-2  <3 WBC/hpf   RBC / HPF TOO NUMEROUS TO COUNT  <3 RBC/hpf   Bacteria, UA FEW (*) RARE   Dg Abd Acute W/chest  06/23/2012  *RADIOLOGY REPORT*  Clinical Data: Abdominal pain and nausea.  ACUTE ABDOMEN SERIES (ABDOMEN 2 VIEW & CHEST 1 VIEW)  Comparison: CT abdomen and pelvis 06/02/2012 and chest and two views abdomen 09/01/2011.  Findings: Single view of the chest demonstrates clear lungs.  Heart size is upper normal.  No pneumothorax or pleural fluid.  Two views of the abdomen show no free intraperitoneal air.  The bowel gas pattern is nonobstructive.  No abnormal abdominal calcification is seen.  Cholecystectomy clips are noted.  IMPRESSION: No acute finding chest or abdomen.   Original Report Authenticated By: Orlean Patten, M.D.     ROS  Blood pressure 174/81, pulse 88, temperature 98.8 F (37.1 C), temperature source Oral, resp. rate 20, height 5\' 4"  (1.626 m), weight 220 lb (99.791 kg), SpO2 96.00%. Physical Exam  Constitutional: She appears well-developed and well-nourished.  HENT:  Mouth/Throat: Oropharynx is clear and moist.   Eyes: Conjunctivae are normal. No scleral icterus.  Neck: No thyromegaly present.  Cardiovascular: Normal rate, regular rhythm and normal heart sounds.   No murmur heard. Respiratory: Effort normal and breath sounds normal.  GI: Soft. She exhibits no mass. There is tenderness (mild midepigastric tenderness). There is no rebound.  Musculoskeletal: She exhibits no edema.  Lymphadenopathy:    She has no cervical adenopathy.  Neurological: She is alert.  Skin: Skin is warm and dry.     Assessment/Plan Average risk screening colonoscopy.  Drucella Karbowski U 06/23/2012, 5:37 PM

## 2012-06-23 NOTE — ED Notes (Signed)
Pt resting in bed, voices no complaints at present.

## 2012-06-23 NOTE — ED Notes (Signed)
Patient ambulated to the restroom and back to room tolerated well.

## 2012-06-23 NOTE — ED Provider Notes (Signed)
History     CSN: [id number]  Arrival date & time 06/23/12  1122   First MD Initiated Contact with Patient 06/23/12 1310      Chief Complaint  Patient presents with  . Dizziness  . Nausea     HPI Pt was seen at 1310.  Per pt and family, c/o gradual onset and persistence of generalized weakness and lightheadedness since yesterday.  States her symptoms began after "I didn't eat all day yesterday" and "started to drink the prep for my colonoscopy last night and today."  States her symptoms continued this morning, she finished the prep, and went to outpatient Endoscopy for her procedure.  Pt states drinking the prep caused an acute flair of her chronic "cramping" abd pain as well as nausea.  States she felt "hot" and "shakey."  Pt also endorses that she has not taken any of her usual meds in the past 2 to 3 days.  At Endoscopy, pt was noted to have HTN and hyperglycemia, was given her usual meds, then sent to the ED for further eval. Denies CP/SOB, no back pain, no vomiting/diarrhea, no fevers, no tingling/numbness in extremities, no focal motor weakness, no syncope.     Past Medical History  Diagnosis Date  . Hypertension   . Diabetes mellitus   . Hypercholesteremia   . Anxiety   . GERD (gastroesophageal reflux disease)   . Hiatal hernia   . Chronic abdominal pain     Past Surgical History  Procedure Laterality Date  . Abdominal hysterectomy    . Esophagogastroduodenoscopy N/A 05/06/2012    Procedure: ESOPHAGOGASTRODUODENOSCOPY (EGD);  Surgeon: Rogene Houston, MD;  Location: [facility];  Service: Endoscopy;  Laterality: N/A;  . Mass excision Right 05/26/2012    Procedure: EXCISION NEOPLASM RIGHT THIGH;  Surgeon: Jamesetta So, MD;  Location: [facility];  Service: General;  Laterality: Right;    Family History  Problem Relation Age of Onset  . Colon cancer Neg Hx   . Colon polyps Neg Hx     History  Substance Use Topics  . Smoking status: Never Smoker   . Smokeless tobacco:  Not on file  . Alcohol Use: No    Review of Systems ROS: Statement: All systems negative except as marked or noted in the HPI; Constitutional: Negative for fever and chills. +generalized weakness; ; Eyes: Negative for eye pain, redness and discharge. ; ; ENMT: Negative for ear pain, hoarseness, nasal congestion, sinus pressure and sore throat. ; ; Cardiovascular: Negative for chest pain, palpitations, diaphoresis, dyspnea and peripheral edema. ; ; Respiratory: Negative for cough, wheezing and stridor. ; ; Gastrointestinal: +nausea, abd pain. Negative for vomiting, diarrhea, blood in stool, hematemesis, jaundice and rectal bleeding. ; ; Genitourinary: Negative for dysuria, flank pain and hematuria. ; ; Musculoskeletal: Negative for back pain and neck pain. Negative for swelling and trauma.; ; Skin: Negative for pruritus, rash, abrasions, blisters, bruising and skin lesion.; ; Neuro: +lightheadedness. Negative for headache and neck stiffness. Negative for altered level of consciousness , altered mental status, extremity weakness, paresthesias, involuntary movement, seizure and syncope.      Allergies  Bee venom and Penicillins  Home Medications   Current Outpatient Rx  Name  Route  Sig  Dispense  Refill  . furosemide (LASIX) 20 MG tablet   Oral   Take 20 mg by mouth every morning.         . peg 3350 powder (MOVIPREP) 100 G SOLR   Oral  Take 1 kit (100 g total) by mouth once.   1 kit   0   . amLODipine-olmesartan (AZOR) 10-40 MG per tablet   Oral   Take 1 tablet by mouth every morning.          Marland Kitchen aspirin EC 81 MG tablet   Oral   Take 81 mg by mouth every morning.         . cloNIDine (CATAPRES) 0.2 MG tablet   Oral   Take 0.2 mg by mouth 2 (two) times daily.          . cycloSPORINE (RESTASIS) 0.05 % ophthalmic emulsion   Both Eyes   Place 1 drop into both eyes daily.         Marland Kitchen EPINEPHrine (EPI-PEN) 0.3 mg/0.3 mL DEVI   Intramuscular   Inject 0.3 mg into the muscle  once.         Marland Kitchen FLUoxetine (PROZAC) 20 MG capsule   Oral   Take 20 mg by mouth every morning.          Marland Kitchen HYDROcodone-acetaminophen (VICODIN) 5-500 MG per tablet   Oral   Take 1 tablet by mouth every 6 (six) hours as needed for pain.   30 tablet   0   . insulin aspart (NOVOLOG FLEXPEN) 100 UNIT/ML injection   Subcutaneous   Inject 25 Units into the skin 3 (three) times daily before meals.          . insulin glargine (LANTUS) 100 UNIT/ML injection   Subcutaneous   Inject 80 Units into the skin at bedtime.   10 mL   0   . levothyroxine (SYNTHROID, LEVOTHROID) 25 MCG tablet   Oral   Take 25 mcg by mouth every morning.          Marland Kitchen omeprazole (PRILOSEC) 20 MG capsule   Oral   Take 1 capsule (20 mg total) by mouth daily.   30 capsule   0   . ondansetron (ZOFRAN) 4 MG tablet   Oral   Take 1 tablet (4 mg total) by mouth every 6 (six) hours.   12 tablet   0   . polyethylene glycol (MIRALAX / GLYCOLAX) packet   Oral   Take 17 g by mouth daily.         Marland Kitchen PYLERA 140-125-125 MG per capsule      TAKE 3 CAPSULES BY MOUTH 4 TIMES DAILY AS DIRECTED.   120 capsule   0   . simvastatin (ZOCOR) 40 MG tablet   Oral   Take 40 mg by mouth every evening.         . sucralfate (CARAFATE) 1 GM/10ML suspension   Oral   Take 1 g by mouth 4 (four) times daily.         . ziprasidone (GEODON) 80 MG capsule   Oral   Take 80 mg by mouth at bedtime.           Marland Kitchen zolpidem (AMBIEN) 5 MG tablet   Oral   Take 5 mg by mouth at bedtime as needed for sleep.           BP 202/89  Pulse 107  Temp(Src) 98.8 F (37.1 C) (Oral)  Resp 24  Ht 5\' 4"  (1.626 m)  Wt 220 lb (99.791 kg)  BMI 37.74 kg/m2  SpO2 97%  Physical Exam 1315: Physical examination:  Nursing notes reviewed; Vital signs and O2 SAT reviewed;  Constitutional: Well developed, Well nourished, Well hydrated, In no  acute distress; Head:  Normocephalic, atraumatic; Eyes: EOMI, PERRL, No scleral icterus; ENMT: Mouth  and pharynx normal, Mucous membranes moist; Neck: Supple, Full range of motion, No lymphadenopathy; Cardiovascular: Regular rate and rhythm, No gallop; Respiratory: Breath sounds clear & equal bilaterally, No rales, rhonchi, wheezes.  Speaking full sentences with ease, Normal respiratory effort/excursion; Chest: Nontender, Movement normal; Abdomen: Soft, Nontender, Nondistended, Normal bowel sounds;; Extremities: Pulses normal, No tenderness, No edema, No calf edema or asymmetry.; Neuro: AA&Ox3, Major CN grossly intact.  Speech clear. No gross focal motor or sensory deficits in extremities.; Skin: Color normal, Warm, Dry.   ED Course  Procedures     MDM  MDM Reviewed: previous chart, nursing note and vitals Reviewed previous: labs and ECG Interpretation: labs, ECG and x-ray    Date: 06/23/2012  Rate: 95  Rhythm: normal sinus rhythm  QRS Axis: normal  Intervals: normal  ST/T Wave abnormalities: nonspecific T wave changes  Conduction Disutrbances:none  Narrative Interpretation:   Old EKG Reviewed: unchanged; no significant changes from previous EKG dated 06/02/2012.  Results for orders placed during the hospital encounter of 06/23/12  GLUCOSE, CAPILLARY      Result Value Range   Glucose-Capillary 302 (*) 70 - 99 mg/dL  GLUCOSE, CAPILLARY      Result Value Range   Glucose-Capillary 332 (*) 70 - 99 mg/dL  CBC WITH DIFFERENTIAL      Result Value Range   WBC 8.6  4.0 - 10.5 K/uL   RBC 5.10  3.87 - 5.11 MIL/uL   Hemoglobin 13.7  12.0 - 15.0 g/dL   HCT 40.0  36.0 - 46.0 %   MCV 78.4  78.0 - 100.0 fL   MCH 26.9  26.0 - 34.0 pg   MCHC 34.3  30.0 - 36.0 g/dL   RDW 14.9  11.5 - 15.5 %   Platelets 304  150 - 400 K/uL   Neutrophils Relative 79 (*) 43 - 77 %   Neutro Abs 6.8  1.7 - 7.7 K/uL   Lymphocytes Relative 13  12 - 46 %   Lymphs Abs 1.1  0.7 - 4.0 K/uL   Monocytes Relative 8  3 - 12 %   Monocytes Absolute 0.6  0.1 - 1.0 K/uL   Eosinophils Relative 0  0 - 5 %   Eosinophils  Absolute 0.0  0.0 - 0.7 K/uL   Basophils Relative 0  0 - 1 %   Basophils Absolute 0.0  0.0 - 0.1 K/uL  BASIC METABOLIC PANEL      Result Value Range   Sodium 140  135 - 145 mEq/L   Potassium 3.2 (*) 3.5 - 5.1 mEq/L   Chloride 101  96 - 112 mEq/L   CO2 25  19 - 32 mEq/L   Glucose, Bld 261 (*) 70 - 99 mg/dL   BUN 6  6 - 23 mg/dL   Creatinine, Ser 0.50  0.50 - 1.10 mg/dL   Calcium 8.7  8.4 - 10.5 mg/dL   GFR calc non Af Amer >90  >90 mL/min   GFR calc Af Amer >90  >90 mL/min  TROPONIN I      Result Value Range   Troponin I <0.30  <0.30 ng/mL    Dg Abd Acute W/chest 06/23/2012  *RADIOLOGY REPORT*  Clinical Data: Abdominal pain and nausea.  ACUTE ABDOMEN SERIES (ABDOMEN 2 VIEW & CHEST 1 VIEW)  Comparison: CT abdomen and pelvis 06/02/2012 and chest and two views abdomen 09/01/2011.  Findings: Single view of the  chest demonstrates clear lungs.  Heart size is upper normal.  No pneumothorax or pleural fluid.  Two views of the abdomen show no free intraperitoneal air.  The bowel gas pattern is nonobstructive.  No abnormal abdominal calcification is seen.  Cholecystectomy clips are noted.  IMPRESSION: No acute finding chest or abdomen.   Original Report Authenticated By: Orlean Patten, M.D.      1600:  Pt's CBG and BP trending down.  Hyperglycemic but not acidotic.  Pt not orthostatic. Pt has ambulated in the ED with steady gait, NAD. T/C to GI Dr. Laural Golden, case discussed, including:  HPI, pertinent PM/SHx, VS/PE, dx testing, ED course and treatment:  Agreeable to come to ED for pt eval to resume colonscopy. Dx and testing, as well as d/w Dr. Laural Golden, d/w pt and family.  Questions answered.  Verb understanding, agreeable to d/c back to Endoscopy with Dr. Laural Golden.           Alfonzo Feller, DO 06/25/12 [phone]

## 2012-06-23 NOTE — ED Notes (Signed)
Pt resting in bed, voices no complaints, family at bedside

## 2012-06-23 NOTE — ED Notes (Signed)
Awaiting transport d/c papers then will transport to endo

## 2012-06-23 NOTE — Progress Notes (Signed)
Pt came into day surgery for screening colonoscopy around 1020. Pt came in c/o "my sugar is up". Pt was shaking and hot and said that was how she feels when her sugar is high. CBG was obtained and showed it was 302. Dr. Laural Golden was notified and he gave order to give Novolog 10 units SQ once and retake CBG in 30 minutes. Orders initiated. Pt's VS were showing BP of 224/104, HR of 103. Pt said she had not taken any of her BP meds or diabetic meds since either Sunday or Monday, she wasn't sure. Pt said Dr. Olevia Perches office told her "not to take my meds". Dr. Laural Golden was notified, orders given to give Enalapril 1.25mg  IV, Amlodipine 10mg  PO, Clonidine 0.2mg  PO, and Xanax 0.5mg  PO. 22g IV started in pt's rt post wrist and Enalapril was given at 1056. Pharmacy was notified by phone that we needed PO meds STAT and when they were brought, they were given at 1111. Pt's CBG was retaken at 1105 and showed it was 332. Pt's VS were now showing BP of 205/105 and HR of 99. Pt then started c/o of "blurry vision", "sick to my stomach", "just don't feel right" although initially she only had symptoms of shaking and feeling hot. Dr. Laural Golden was notified once again and he gave order to transfer her to ED and if she became stable later on today he would do her colonoscopy this afternoon. ED charge nurse, Almyra Free, was called and report given to her. Pt and husband were then transferred to the ED.

## 2012-06-23 NOTE — ED Notes (Signed)
Pt awaiting dr. Dereck Leep to arrive to see her in the ed

## 2012-06-23 NOTE — ED Notes (Signed)
Pt taken to [facility] via Wheelchair. C/o being dizzy and an  upset stomach.  Nurse informed.

## 2012-06-23 NOTE — ED Notes (Signed)
Pt from Day Surgery.  Pt was being prepped for Colonoscopy and reports feeling dizzy and lightheaded.  Nurse checked bp and it was initially 224/104.  Nurse reports giving 0.5 mg xanax, clonidine 0.2mg .  Pt continues to reports nausea and dizziness.  Pt was brought to ER to evaluation.

## 2012-06-23 NOTE — ED Notes (Signed)
Pt transferred to endo department. Discharge papers signed. And given to pt.

## 2012-06-23 NOTE — Op Note (Signed)
COLONOSCOPY PROCEDURE REPORT  PATIENT:  Holly Baldwin  MR#:  [id number] Birthdate:  [birth year], 67 y.o., female Endoscopist:  Dr. Rogene Houston, MD Referred By:  Dr. Purvis Kilts, MD  Procedure:   Colonoscopy with snare polypectomy.  Indications:  Patient is 67 year old African female was undergoing average risk screening colonoscopy.  Informed Consent:  The procedure and risks were reviewed with the patient and informed consent was obtained.  Medications:  Demerol 50 mg IV Versed 8 mg IV  Description of procedure:  After a digital rectal exam was performed, that colonoscope was advanced from the anus through the rectum and colon to the area of the cecum, ileocecal valve and appendiceal orifice. The cecum was deeply intubated. These structures were well-seen and photographed for the record. From the level of the cecum and ileocecal valve, the scope was slowly and cautiously withdrawn. The mucosal surfaces were carefully surveyed utilizing scope tip to flexion to facilitate fold flattening as needed. The scope was pulled down into the rectum where a thorough exam including retroflexion was performed.  Findings:   Prep satisfactory. 10 mm polyp snared from ascending colon. Two small polyps are ablated via cold biopsy and submitted together. These are located at transverse and sigmoid colon. Normal rectal mucosa and anal rectal junction.   Therapeutic/Diagnostic Maneuvers Performed:  See above  Complications:  None  Cecal Withdrawal Time:  12 minutes  Impression:  Examination performed to cecum. 10 mm polyp snared from ascending colon. Two small polyps ablated via cold biopsy and submitted together(transverse and sigmoid colon).  Recommendations:  Standard instructions given. K-Dur 20 mEq by mouth daily. I will contact patient with biopsy results and further recommendations.  Holly Baldwin U  06/23/2012 6:54 PM  CC: Dr. Hilma Favors, Betsy Coder, MD & Dr. Rayne Du ref. provider  found

## 2012-06-25 LAB — URINE CULTURE: Colony Count: 50000

## 2012-06-28 ENCOUNTER — Encounter (HOSPITAL_COMMUNITY): Payer: Self-pay | Admitting: Internal Medicine

## 2012-06-29 ENCOUNTER — Telehealth (INDEPENDENT_AMBULATORY_CARE_PROVIDER_SITE_OTHER): Payer: Self-pay | Admitting: *Deleted

## 2012-06-29 NOTE — Telephone Encounter (Signed)
Holly Baldwin would like to get her TCS results and has questions about her medicines. She is also having lots of gas. The return phone number is [phone].

## 2012-06-30 NOTE — Telephone Encounter (Signed)
Patient called and advised per Deberah Castle, NP to take  Phazyme as directed , and to walk this would help to pass the gas. Patient was advised that once the bx results were rec'd she would be called

## 2012-07-05 ENCOUNTER — Encounter (INDEPENDENT_AMBULATORY_CARE_PROVIDER_SITE_OTHER): Payer: Self-pay | Admitting: *Deleted

## 2012-07-20 ENCOUNTER — Other Ambulatory Visit (HOSPITAL_COMMUNITY): Payer: Self-pay | Admitting: Family Medicine

## 2012-07-20 DIAGNOSIS — Z139 Encounter for screening, unspecified: Secondary | ICD-10-CM

## 2012-07-27 ENCOUNTER — Other Ambulatory Visit (INDEPENDENT_AMBULATORY_CARE_PROVIDER_SITE_OTHER): Payer: Self-pay | Admitting: Internal Medicine

## 2012-07-27 NOTE — Telephone Encounter (Signed)
Patient called no answer Please find out why she needs second prescription for Pylera.

## 2012-07-28 NOTE — Telephone Encounter (Signed)
Pharmacy called and I spoke with Ilene. She states that they rec'd the prescription for Potassium on yesterday . The Pylera prescription was not rec'd , however they acknowledged that she had that prescription filled in April 2014.

## 2012-08-23 ENCOUNTER — Ambulatory Visit (HOSPITAL_COMMUNITY)
Admission: RE | Admit: 2012-08-23 | Discharge: 2012-08-23 | Disposition: A | Discharge status: Home / Self Care | Payer: Medicare Other | Source: Ambulatory Visit | Attending: Family Medicine | Admitting: Family Medicine

## 2012-08-23 DIAGNOSIS — Z1231 Encounter for screening mammogram for malignant neoplasm of breast: Secondary | ICD-10-CM | POA: Insufficient documentation

## 2012-08-23 DIAGNOSIS — Z139 Encounter for screening, unspecified: Secondary | ICD-10-CM

## 2012-11-22 ENCOUNTER — Telehealth (INDEPENDENT_AMBULATORY_CARE_PROVIDER_SITE_OTHER): Payer: Self-pay | Admitting: *Deleted

## 2012-11-22 NOTE — Telephone Encounter (Signed)
Would like to know if she should continue taking her potassium? The return phone number is [phone].

## 2012-11-23 NOTE — Telephone Encounter (Signed)
Per Dr.Rehman, she was to follow up with Dr.McInnis,have K+ checked and Dr.McInnis make that decision. Patient was called and made aware.

## 2012-11-23 NOTE — Telephone Encounter (Signed)
Phone line busy @ 12:25 pm,will try again later.

## 2012-11-23 NOTE — Telephone Encounter (Signed)
I have talked with the patient and she says that her PCP is Dr.Golding.  I explained Dr.Rehman's recommendation and told her that I would call and tell Dr.Golding's nurse. I called their office @ 4:30 pm and they have closed. Will attempt again tomorrow.

## 2012-11-26 NOTE — Telephone Encounter (Signed)
I have called Dr. Delanna Ahmadi office and made them aware .

## 2013-07-19 IMAGING — CT CT ABD-PELV W/ CM
3 of 9 series · 11 of 46 positions shown, 17 images · IV contrast (omnipaque)
Comparison: 05/05/2012, 01/17/2011
Correlation:  MRA abdomen 06/30/2007

CLINICAL DATA: Abdominal pain, epigastric pain, and abdominal
distention, history hypertension, diabetes, hypercholesterolemia,
GERD, hiatal hernia

CT ABDOMEN AND PELVIS WITH CONTRAST
TECHNIQUE: Multidetector CT imaging of the abdomen and pelvis was
performed following the standard protocol during bolus
administration of intravenous contrast. Sagittal and coronal MPR
images reconstructed from axial data set.
Contrast: 100mL OMNIPAQUE IOHEXOL 300 MG/ML  SOLN Dilute oral
contrast.

[Series 4: abd_pel_with 5.0 b40f · axial · 0.81mm/px · z∈[-209,-49]mm · 5 of 48 slices shown, 10 images (1 of 2)]
[im 8/48  soft-tissue]
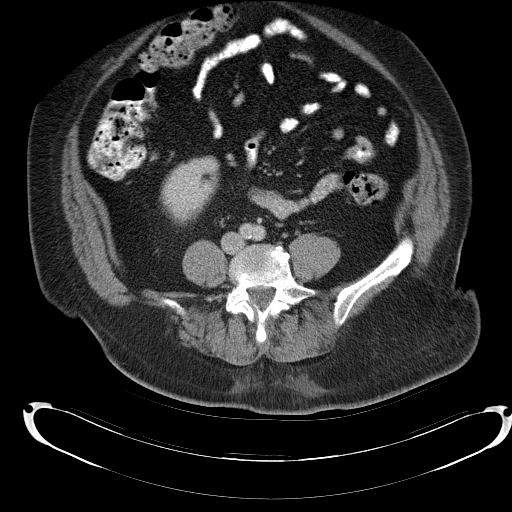
[im 8/48  bone]
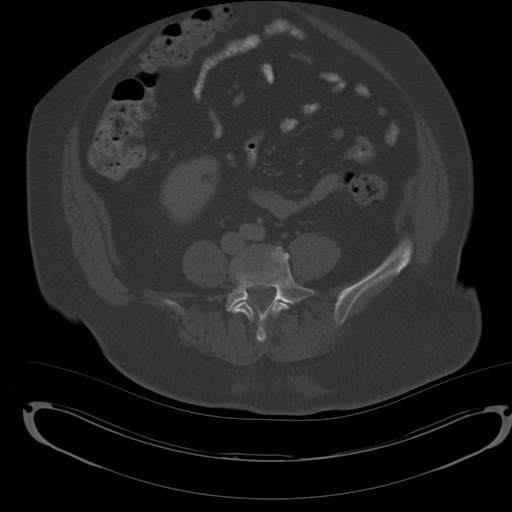
[im 16/48  soft-tissue]
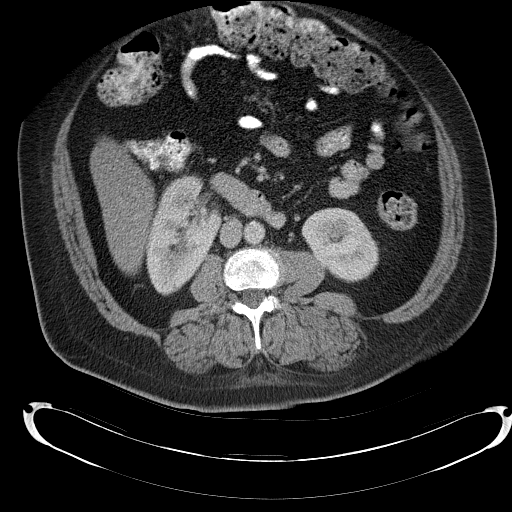
[im 16/48  lung]
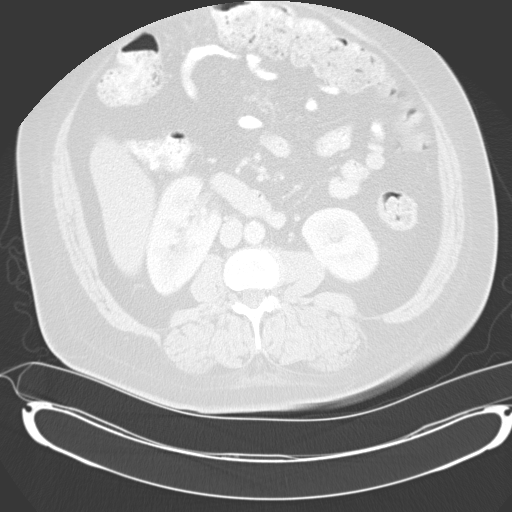
[im 24/48  soft-tissue]
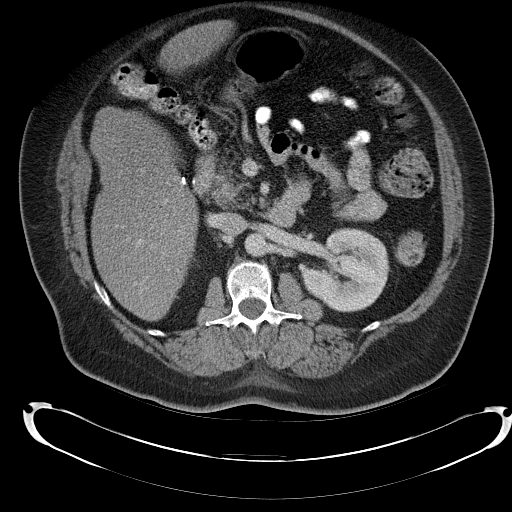
[im 24/48  lung]
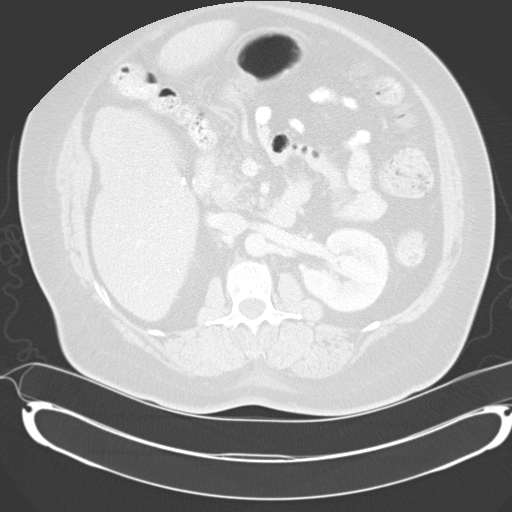
[im 32/48  soft-tissue]
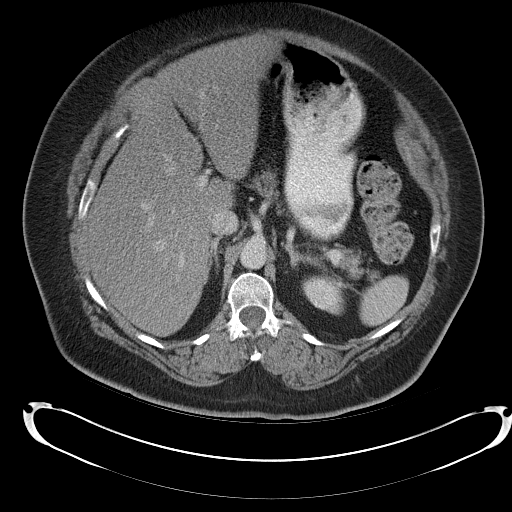
[im 32/48  lung]
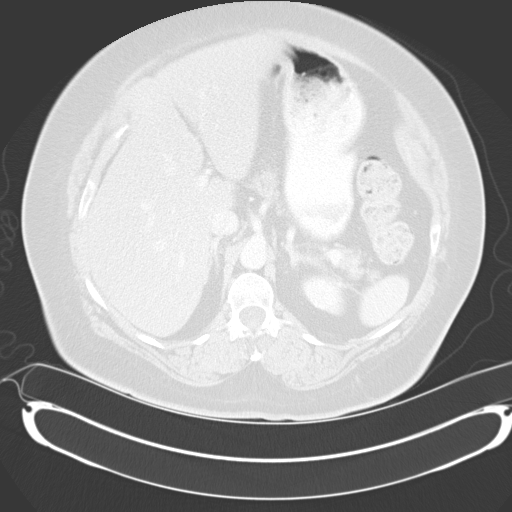
[im 40/48  soft-tissue]
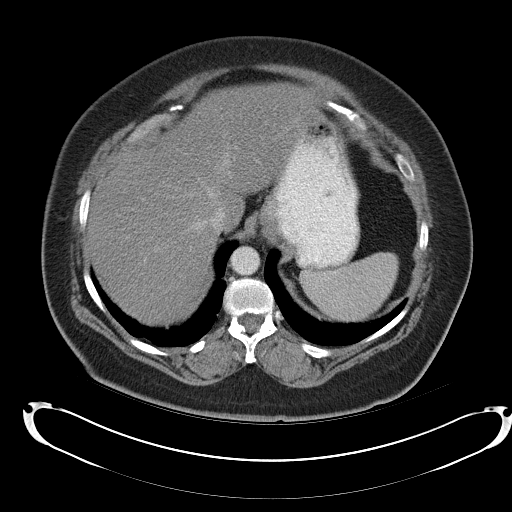
[im 40/48  lung]
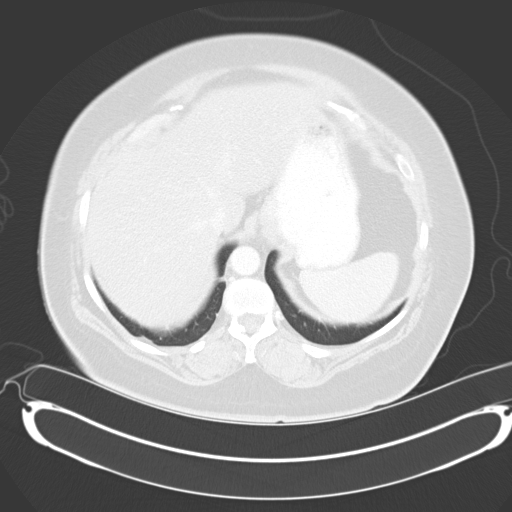

[Series 5: abd_pel_with 3.0 spo cor · coronal · 0.46mm/px · 3 of 121 slices shown, 4 images]
[im 25/121  soft-tissue]
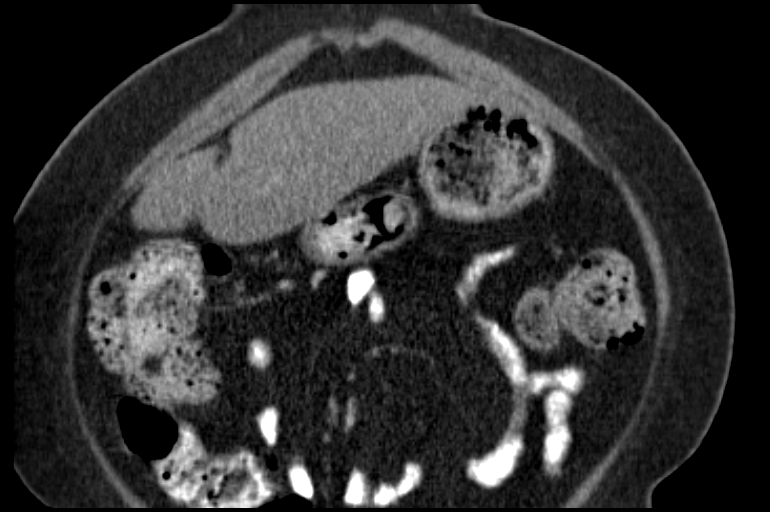
[im 49/121  soft-tissue]
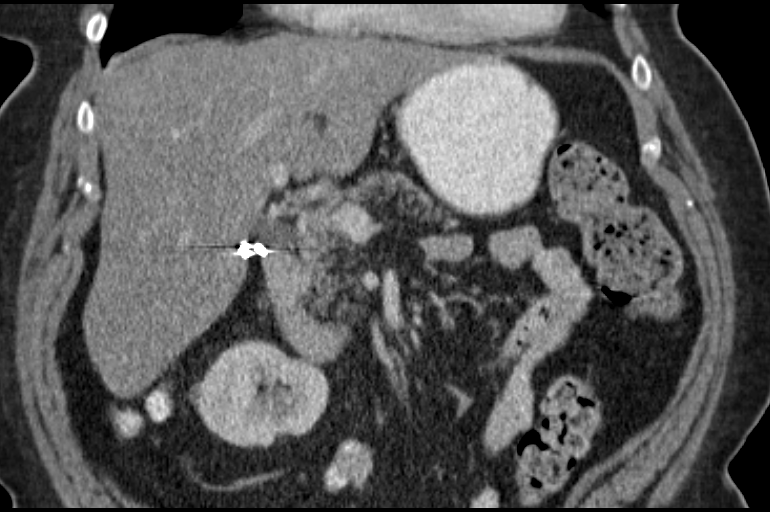
[im 49/121  bone]
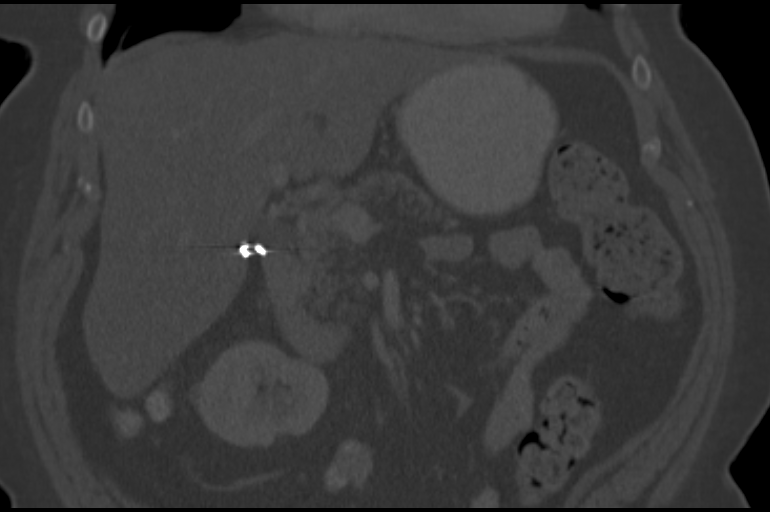
[im 73/121  soft-tissue]
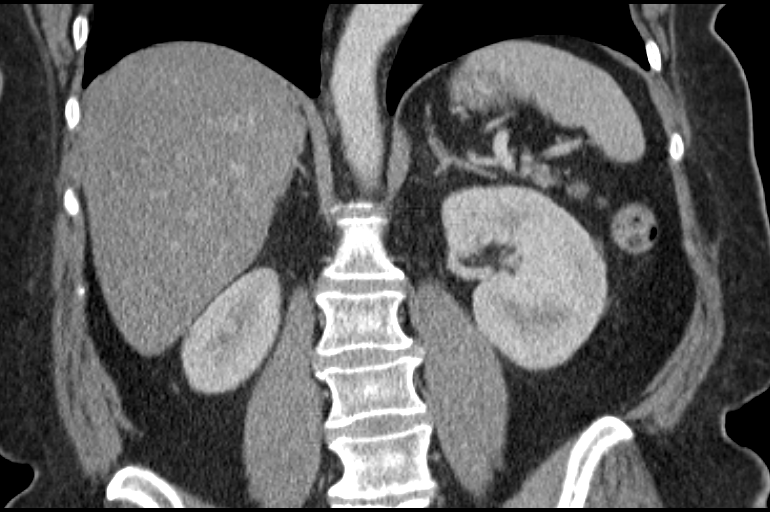

[Series 8: abd_pel_with 5.0 b40f · axial · 0.76mm/px · z∈[-416,-336]mm · 3 of 48 slices shown (2 of 2)]
[im 8/48  soft-tissue]
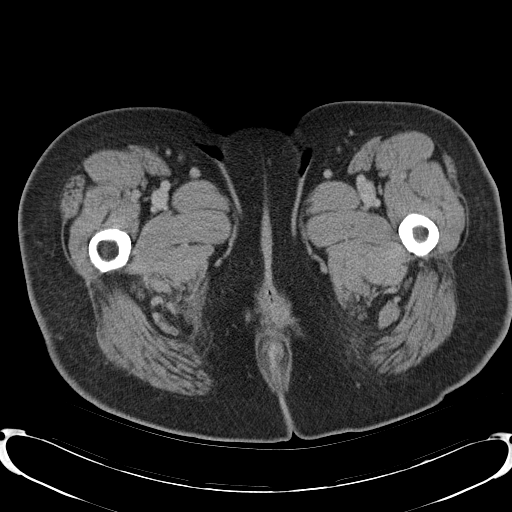
[im 16/48  soft-tissue]
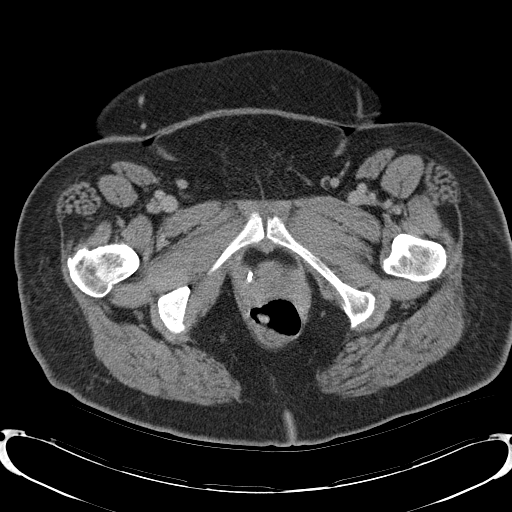
[im 24/48  soft-tissue]
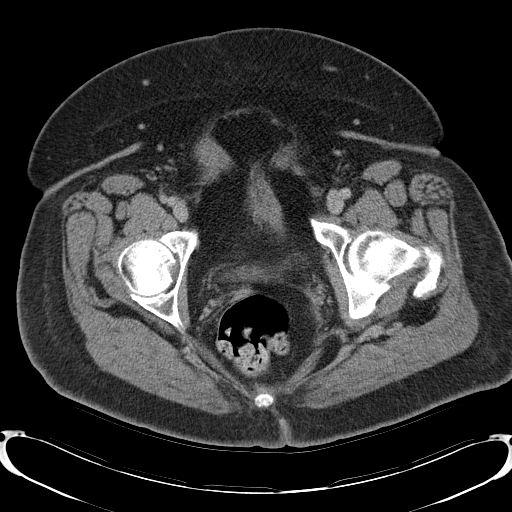

[11 of 46 positions shown; findings below may reference images not displayed]

FINDINGS: Minimal atelectasis or scarring at lung bases.
Diffuse fatty infiltration of liver.
Nonspecific low attenuation focus posterior right lobe liver 1.6 x
1.4 cm image 15. Gallbladder surgically absent.
Probable tiny parenchymal calcification in left kidney with tiny
bilateral renal cysts.
Remainder of liver, spleen, pancreas, kidneys, and adrenal glands
unremarkable.

Uterus surgically absent with normal sized ovaries.
Appendix not definitely visualized; no pericecal inflammatory
process identified.
Stomach and bowel loops normal appearance.
Unremarkable bladder and ureters.
Scattered pelvic phleboliths.
No mass, free air, free fluid, or inflammatory process.

Nonspecific minimally enlarged para esophageal lymph node at
inferior mediastinum 13 x 12 mm image 4.
No additional intra-abdominal or intrapelvic adenopathy.
No acute osseous findings.
IMPRESSION: Nonspecific minimally enlarged para esophageal lymph node at
inferior mediastinum.
Fatty infiltration of liver.
Nonspecific low attenuation focus right lobe liver 1.6 x 1.4 cm,
minimally larger than on previous exam when it measured 15 x 10 mm.
This lesion was present on a prior MRI from 7225 when measured
x 1.2 cm (series 6 image 38).
No definite acute intra abdominal or intrapelvic abnormalities
identified.

## 2013-08-08 ENCOUNTER — Other Ambulatory Visit (HOSPITAL_COMMUNITY): Payer: Self-pay | Admitting: Family Medicine

## 2013-08-08 DIAGNOSIS — Z1231 Encounter for screening mammogram for malignant neoplasm of breast: Secondary | ICD-10-CM

## 2013-08-29 ENCOUNTER — Ambulatory Visit (HOSPITAL_COMMUNITY)
Admission: RE | Admit: 2013-08-29 | Discharge: 2013-08-29 | Disposition: A | Discharge status: Home / Self Care | Payer: Medicare HMO | Source: Ambulatory Visit | Attending: Family Medicine | Admitting: Family Medicine

## 2013-08-29 DIAGNOSIS — Z1231 Encounter for screening mammogram for malignant neoplasm of breast: Secondary | ICD-10-CM | POA: Insufficient documentation

## 2013-08-29 DIAGNOSIS — R928 Other abnormal and inconclusive findings on diagnostic imaging of breast: Secondary | ICD-10-CM | POA: Insufficient documentation

## 2013-08-31 ENCOUNTER — Other Ambulatory Visit: Payer: Self-pay | Admitting: Family Medicine

## 2013-08-31 DIAGNOSIS — R928 Other abnormal and inconclusive findings on diagnostic imaging of breast: Secondary | ICD-10-CM

## 2013-09-13 ENCOUNTER — Ambulatory Visit (HOSPITAL_COMMUNITY)
Admission: RE | Admit: 2013-09-13 | Discharge: 2013-09-13 | Disposition: A | Discharge status: Home / Self Care | Payer: Medicare HMO | Source: Ambulatory Visit | Attending: Family Medicine | Admitting: Family Medicine

## 2013-09-13 ENCOUNTER — Other Ambulatory Visit: Payer: Self-pay | Admitting: Family Medicine

## 2013-09-13 DIAGNOSIS — R928 Other abnormal and inconclusive findings on diagnostic imaging of breast: Secondary | ICD-10-CM

## 2014-02-06 ENCOUNTER — Other Ambulatory Visit (HOSPITAL_COMMUNITY): Payer: Self-pay | Admitting: Family Medicine

## 2014-02-06 DIAGNOSIS — Z09 Encounter for follow-up examination after completed treatment for conditions other than malignant neoplasm: Secondary | ICD-10-CM

## 2014-03-21 ENCOUNTER — Ambulatory Visit (HOSPITAL_COMMUNITY)
Admission: RE | Admit: 2014-03-21 | Discharge: 2014-03-21 | Disposition: A | Discharge status: Home / Self Care | Payer: Medicare HMO | Source: Ambulatory Visit | Attending: Family Medicine | Admitting: Family Medicine

## 2014-03-21 DIAGNOSIS — Z09 Encounter for follow-up examination after completed treatment for conditions other than malignant neoplasm: Secondary | ICD-10-CM | POA: Insufficient documentation

## 2014-03-28 ENCOUNTER — Encounter (HOSPITAL_COMMUNITY): Payer: Medicare HMO

## 2014-04-11 ENCOUNTER — Other Ambulatory Visit (HOSPITAL_COMMUNITY): Payer: Self-pay | Admitting: Family Medicine

## 2014-04-11 ENCOUNTER — Ambulatory Visit (HOSPITAL_COMMUNITY)
Admission: RE | Admit: 2014-04-11 | Discharge: 2014-04-11 | Disposition: A | Discharge status: Home / Self Care | Payer: Medicare HMO | Source: Ambulatory Visit | Attending: Family Medicine | Admitting: Family Medicine

## 2014-04-11 DIAGNOSIS — Z09 Encounter for follow-up examination after completed treatment for conditions other than malignant neoplasm: Secondary | ICD-10-CM

## 2014-04-11 DIAGNOSIS — R928 Other abnormal and inconclusive findings on diagnostic imaging of breast: Secondary | ICD-10-CM | POA: Diagnosis not present

## 2014-04-11 DIAGNOSIS — N63 Unspecified lump in breast: Secondary | ICD-10-CM | POA: Insufficient documentation

## 2014-05-02 DIAGNOSIS — E782 Mixed hyperlipidemia: Secondary | ICD-10-CM | POA: Diagnosis not present

## 2014-05-02 DIAGNOSIS — E038 Other specified hypothyroidism: Secondary | ICD-10-CM | POA: Diagnosis not present

## 2014-05-02 DIAGNOSIS — I10 Essential (primary) hypertension: Secondary | ICD-10-CM | POA: Diagnosis not present

## 2014-05-02 DIAGNOSIS — E1122 Type 2 diabetes mellitus with diabetic chronic kidney disease: Secondary | ICD-10-CM | POA: Diagnosis not present

## 2014-05-09 DIAGNOSIS — E6609 Other obesity due to excess calories: Secondary | ICD-10-CM | POA: Diagnosis not present

## 2014-05-09 DIAGNOSIS — I10 Essential (primary) hypertension: Secondary | ICD-10-CM | POA: Diagnosis not present

## 2014-05-09 DIAGNOSIS — E1122 Type 2 diabetes mellitus with diabetic chronic kidney disease: Secondary | ICD-10-CM | POA: Diagnosis not present

## 2014-05-09 DIAGNOSIS — E782 Mixed hyperlipidemia: Secondary | ICD-10-CM | POA: Diagnosis not present

## 2014-05-09 DIAGNOSIS — E039 Hypothyroidism, unspecified: Secondary | ICD-10-CM | POA: Diagnosis not present

## 2014-07-07 DIAGNOSIS — J069 Acute upper respiratory infection, unspecified: Secondary | ICD-10-CM | POA: Diagnosis not present

## 2014-07-07 DIAGNOSIS — J209 Acute bronchitis, unspecified: Secondary | ICD-10-CM | POA: Diagnosis not present

## 2014-07-07 DIAGNOSIS — E6609 Other obesity due to excess calories: Secondary | ICD-10-CM | POA: Diagnosis not present

## 2014-07-07 DIAGNOSIS — Z6836 Body mass index (BMI) 36.0-36.9, adult: Secondary | ICD-10-CM | POA: Diagnosis not present

## 2014-08-03 DIAGNOSIS — E785 Hyperlipidemia, unspecified: Secondary | ICD-10-CM | POA: Diagnosis not present

## 2014-08-03 DIAGNOSIS — I10 Essential (primary) hypertension: Secondary | ICD-10-CM | POA: Diagnosis not present

## 2014-08-03 DIAGNOSIS — E1122 Type 2 diabetes mellitus with diabetic chronic kidney disease: Secondary | ICD-10-CM | POA: Diagnosis not present

## 2014-08-03 DIAGNOSIS — Z713 Dietary counseling and surveillance: Secondary | ICD-10-CM | POA: Diagnosis not present

## 2014-08-03 DIAGNOSIS — E038 Other specified hypothyroidism: Secondary | ICD-10-CM | POA: Diagnosis not present

## 2014-08-10 DIAGNOSIS — E6609 Other obesity due to excess calories: Secondary | ICD-10-CM | POA: Diagnosis not present

## 2014-08-10 DIAGNOSIS — E039 Hypothyroidism, unspecified: Secondary | ICD-10-CM | POA: Diagnosis not present

## 2014-08-10 DIAGNOSIS — E1122 Type 2 diabetes mellitus with diabetic chronic kidney disease: Secondary | ICD-10-CM | POA: Diagnosis not present

## 2014-08-10 DIAGNOSIS — I10 Essential (primary) hypertension: Secondary | ICD-10-CM | POA: Diagnosis not present

## 2014-08-10 DIAGNOSIS — E785 Hyperlipidemia, unspecified: Secondary | ICD-10-CM | POA: Diagnosis not present

## 2014-08-31 ENCOUNTER — Other Ambulatory Visit (HOSPITAL_COMMUNITY): Payer: Self-pay | Admitting: Family Medicine

## 2014-08-31 DIAGNOSIS — Z09 Encounter for follow-up examination after completed treatment for conditions other than malignant neoplasm: Secondary | ICD-10-CM

## 2014-08-31 DIAGNOSIS — N6320 Unspecified lump in the left breast, unspecified quadrant: Secondary | ICD-10-CM

## 2014-09-18 ENCOUNTER — Ambulatory Visit (HOSPITAL_COMMUNITY)
Admission: RE | Admit: 2014-09-18 | Discharge: 2014-09-18 | Disposition: A | Discharge status: Home / Self Care | Payer: Commercial Managed Care - HMO | Source: Ambulatory Visit | Attending: Family Medicine | Admitting: Family Medicine

## 2014-09-18 ENCOUNTER — Other Ambulatory Visit (HOSPITAL_COMMUNITY): Payer: Self-pay | Admitting: Family Medicine

## 2014-09-18 DIAGNOSIS — M7989 Other specified soft tissue disorders: Secondary | ICD-10-CM | POA: Diagnosis not present

## 2014-09-18 DIAGNOSIS — M79605 Pain in left leg: Secondary | ICD-10-CM | POA: Diagnosis not present

## 2014-09-18 DIAGNOSIS — M79606 Pain in leg, unspecified: Secondary | ICD-10-CM | POA: Diagnosis not present

## 2014-09-18 DIAGNOSIS — Z23 Encounter for immunization: Secondary | ICD-10-CM | POA: Diagnosis not present

## 2014-09-18 DIAGNOSIS — Z1389 Encounter for screening for other disorder: Secondary | ICD-10-CM

## 2014-09-18 DIAGNOSIS — Z6838 Body mass index (BMI) 38.0-38.9, adult: Secondary | ICD-10-CM | POA: Diagnosis not present

## 2014-09-18 DIAGNOSIS — R609 Edema, unspecified: Secondary | ICD-10-CM | POA: Diagnosis not present

## 2014-09-26 ENCOUNTER — Ambulatory Visit (HOSPITAL_COMMUNITY)
Admission: RE | Admit: 2014-09-26 | Discharge: 2014-09-26 | Disposition: A | Discharge status: Home / Self Care | Payer: Commercial Managed Care - HMO | Source: Ambulatory Visit | Attending: Family Medicine | Admitting: Family Medicine

## 2014-09-26 DIAGNOSIS — Z794 Long term (current) use of insulin: Secondary | ICD-10-CM | POA: Insufficient documentation

## 2014-09-26 DIAGNOSIS — E119 Type 2 diabetes mellitus without complications: Secondary | ICD-10-CM | POA: Insufficient documentation

## 2014-09-26 DIAGNOSIS — Z1389 Encounter for screening for other disorder: Secondary | ICD-10-CM

## 2014-09-26 DIAGNOSIS — Z780 Asymptomatic menopausal state: Secondary | ICD-10-CM | POA: Diagnosis not present

## 2014-09-26 DIAGNOSIS — M79606 Pain in leg, unspecified: Secondary | ICD-10-CM

## 2014-09-26 DIAGNOSIS — M8580 Other specified disorders of bone density and structure, unspecified site: Secondary | ICD-10-CM | POA: Insufficient documentation

## 2014-09-26 DIAGNOSIS — M8588 Other specified disorders of bone density and structure, other site: Secondary | ICD-10-CM | POA: Diagnosis not present

## 2014-10-10 ENCOUNTER — Emergency Department (HOSPITAL_COMMUNITY): Payer: Commercial Managed Care - HMO

## 2014-10-10 ENCOUNTER — Encounter (HOSPITAL_COMMUNITY): Payer: Self-pay | Admitting: Emergency Medicine

## 2014-10-10 ENCOUNTER — Other Ambulatory Visit: Payer: Self-pay

## 2014-10-10 ENCOUNTER — Emergency Department (HOSPITAL_COMMUNITY)
Admission: EM | Admit: 2014-10-10 | Discharge: 2014-10-10 | Disposition: A | Discharge status: Home / Self Care | Payer: Commercial Managed Care - HMO | Attending: Emergency Medicine | Admitting: Emergency Medicine

## 2014-10-10 ENCOUNTER — Ambulatory Visit (HOSPITAL_COMMUNITY)
Admission: RE | Admit: 2014-10-10 | Discharge: 2014-10-10 | Disposition: A | Discharge status: Home / Self Care | Payer: Commercial Managed Care - HMO | Source: Ambulatory Visit | Attending: Family Medicine | Admitting: Family Medicine

## 2014-10-10 DIAGNOSIS — Z794 Long term (current) use of insulin: Secondary | ICD-10-CM | POA: Diagnosis not present

## 2014-10-10 DIAGNOSIS — N6320 Unspecified lump in the left breast, unspecified quadrant: Secondary | ICD-10-CM

## 2014-10-10 DIAGNOSIS — Z880 Allergy status to penicillin: Secondary | ICD-10-CM | POA: Diagnosis not present

## 2014-10-10 DIAGNOSIS — Z79899 Other long term (current) drug therapy: Secondary | ICD-10-CM | POA: Diagnosis not present

## 2014-10-10 DIAGNOSIS — I10 Essential (primary) hypertension: Secondary | ICD-10-CM | POA: Diagnosis not present

## 2014-10-10 DIAGNOSIS — R519 Headache, unspecified: Secondary | ICD-10-CM

## 2014-10-10 DIAGNOSIS — R531 Weakness: Secondary | ICD-10-CM | POA: Diagnosis not present

## 2014-10-10 DIAGNOSIS — E119 Type 2 diabetes mellitus without complications: Secondary | ICD-10-CM | POA: Insufficient documentation

## 2014-10-10 DIAGNOSIS — E669 Obesity, unspecified: Secondary | ICD-10-CM | POA: Insufficient documentation

## 2014-10-10 DIAGNOSIS — E780 Pure hypercholesterolemia: Secondary | ICD-10-CM | POA: Insufficient documentation

## 2014-10-10 DIAGNOSIS — K219 Gastro-esophageal reflux disease without esophagitis: Secondary | ICD-10-CM | POA: Diagnosis not present

## 2014-10-10 DIAGNOSIS — R52 Pain, unspecified: Secondary | ICD-10-CM

## 2014-10-10 DIAGNOSIS — F419 Anxiety disorder, unspecified: Secondary | ICD-10-CM | POA: Diagnosis not present

## 2014-10-10 DIAGNOSIS — N63 Unspecified lump in breast: Secondary | ICD-10-CM

## 2014-10-10 DIAGNOSIS — R51 Headache: Secondary | ICD-10-CM | POA: Insufficient documentation

## 2014-10-10 DIAGNOSIS — R079 Chest pain, unspecified: Secondary | ICD-10-CM | POA: Diagnosis not present

## 2014-10-10 DIAGNOSIS — G8929 Other chronic pain: Secondary | ICD-10-CM | POA: Diagnosis not present

## 2014-10-10 DIAGNOSIS — R1011 Right upper quadrant pain: Secondary | ICD-10-CM | POA: Diagnosis not present

## 2014-10-10 DIAGNOSIS — Z09 Encounter for follow-up examination after completed treatment for conditions other than malignant neoplasm: Secondary | ICD-10-CM

## 2014-10-10 LAB — URINALYSIS, ROUTINE W REFLEX MICROSCOPIC
Bilirubin Urine: NEGATIVE
Glucose, UA: 100 mg/dL — AB
Leukocytes, UA: NEGATIVE
Nitrite: NEGATIVE
Protein, ur: >300 mg/dL — AB
Specific Gravity, Urine: 1.02 (ref 1.005–1.030)
Urobilinogen, UA: 1 mg/dL (ref 0.0–1.0)
pH: 6 (ref 5.0–8.0)

## 2014-10-10 LAB — HEPATIC FUNCTION PANEL
ALT: 24 U/L (ref 14–54)
AST: 27 U/L (ref 15–41)
Albumin: 3.5 g/dL (ref 3.5–5.0)
Alkaline Phosphatase: 107 U/L (ref 38–126)
Bilirubin, Direct: 0.1 mg/dL (ref 0.1–0.5)
Indirect Bilirubin: 0.3 mg/dL (ref 0.3–0.9)
Total Bilirubin: 0.4 mg/dL (ref 0.3–1.2)
Total Protein: 6.7 g/dL (ref 6.5–8.1)

## 2014-10-10 LAB — CBC
HCT: 39.9 % (ref 36.0–46.0)
Hemoglobin: 13 g/dL (ref 12.0–15.0)
MCH: 27.3 pg (ref 26.0–34.0)
MCHC: 32.6 g/dL (ref 30.0–36.0)
MCV: 83.6 fL (ref 78.0–100.0)
Platelets: 295 10*3/uL (ref 150–400)
RBC: 4.77 MIL/uL (ref 3.87–5.11)
RDW: 15.2 % (ref 11.5–15.5)
WBC: 9.1 10*3/uL (ref 4.0–10.5)

## 2014-10-10 LAB — BASIC METABOLIC PANEL
Anion gap: 12 (ref 5–15)
BUN: 13 mg/dL (ref 6–20)
CO2: 28 mmol/L (ref 22–32)
Calcium: 8.8 mg/dL — ABNORMAL LOW (ref 8.9–10.3)
Chloride: 100 mmol/L — ABNORMAL LOW (ref 101–111)
Creatinine, Ser: 0.8 mg/dL (ref 0.44–1.00)
GFR calc Af Amer: >60 mL/min (ref >60)
GFR calc non Af Amer: >60 mL/min (ref >60)
Glucose, Bld: 220 mg/dL — ABNORMAL HIGH (ref 65–99)
Potassium: 3.2 mmol/L — ABNORMAL LOW (ref 3.5–5.1)
Sodium: 140 mmol/L (ref 135–145)

## 2014-10-10 LAB — TROPONIN I: Troponin I: <0.03 ng/mL (ref <0.031)

## 2014-10-10 LAB — URINE MICROSCOPIC-ADD ON

## 2014-10-10 LAB — LIPASE, BLOOD: Lipase: <10 U/L — ABNORMAL LOW (ref 22–51)

## 2014-10-10 MED ORDER — OXYCODONE-ACETAMINOPHEN 5-325 MG PO TABS: 1.0000 | ORAL_TABLET | Freq: Four times a day (QID) | ORAL | Status: DC | PRN

## 2014-10-10 MED ORDER — OXYCODONE-ACETAMINOPHEN 5-325 MG PO TABS
1.0000 | ORAL_TABLET | Freq: Once | ORAL | Status: AC
  Administered 2014-10-10: 1 via ORAL
  Filled 2014-10-10: qty 1

## 2014-10-10 MED ORDER — FENTANYL CITRATE (PF) 100 MCG/2ML IJ SOLN
50.0000 ug | Freq: Once | INTRAMUSCULAR | Status: AC
  Administered 2014-10-10: 50 ug via INTRAVENOUS
  Filled 2014-10-10: qty 2

## 2014-10-10 MED ORDER — ACETAMINOPHEN 500 MG PO TABS
1000.0000 mg | ORAL_TABLET | Freq: Once | ORAL | Status: AC
  Administered 2014-10-10: 1000 mg via ORAL
  Filled 2014-10-10: qty 2

## 2014-10-10 MED ORDER — ONDANSETRON 4 MG PREPACK (~~LOC~~): 1.0000 | ORAL_TABLET | Freq: Three times a day (TID) | ORAL | Status: DC | PRN

## 2014-10-10 NOTE — ED Notes (Signed)
Pt c/o chest aching x 2 weeks. Pt states pain radiates down both arms today and her hands feel tingly/numb. Some dizziness and nausea.

## 2014-10-10 NOTE — ED Notes (Signed)
MD at bedside. 

## 2014-10-10 NOTE — Discharge Instructions (Signed)
Tests showed no life-threatening problems. Prescription for pain and nausea medicine. Follow-up your primary care doctor.

## 2014-10-10 NOTE — ED Provider Notes (Signed)
CSN: [id number]     Arrival date & time 10/10/14  [id number] History  This chart was scribed for Nat Christen, MD by Hilda Lias, ED Scribe. This patient was seen in room APA18/APA18 and the patient's care was started at 9:26 AM.    Chief Complaint  Patient presents with  . Chest Pain     Patient is a 69 y.o. female presenting with chest pain. The history is provided by the patient and the spouse. No language interpreter was used.  Chest Pain  HPI Comments: Holly Baldwin is a 69 y.o. female who presents to the Emergency Department complaining of generalized whole-body pain with more severe pain in her head, both shoulders, RUQ, and right side of chest that has been present for 2-3 weeks. Pt also notes having numbness to digits 2-5 of her left hand. Pt states her headache is bothering her the most out of all of her symptoms and states that her headache extends from the frontal region to the top of her head and around to the back side of her neck. Her husband states she does not normally have headaches. Pt reports taking 2 tylenol last night and 2 this morning with little relief. Pt denies SOB. Pt states she saw her PCP around 26th of July for similar symptoms.     Past Medical History  Diagnosis Date  . Hypertension   . Diabetes mellitus   . Hypercholesteremia   . Anxiety   . GERD (gastroesophageal reflux disease)   . Hiatal hernia   . Chronic abdominal pain    Past Surgical History  Procedure Laterality Date  . Abdominal hysterectomy    . Esophagogastroduodenoscopy N/A 05/06/2012    Procedure: ESOPHAGOGASTRODUODENOSCOPY (EGD);  Surgeon: Rogene Houston, MD;  [facility];  Service: Endoscopy;  Laterality: N/A;  . Mass excision Right 05/26/2012    Procedure: EXCISION NEOPLASM RIGHT THIGH;  Surgeon: Jamesetta So, MD;  Location: [facility];  Service: General;  Laterality: Right;  . Colonoscopy N/A 06/23/2012    Procedure: COLONOSCOPY;  Surgeon: Rogene Houston, MD;  Location[facility];  Service: Endoscopy;  Laterality: N/A;  100-moved to 1200 Ann to notify pt   Family History  Problem Relation Age of Onset  . Colon cancer Neg Hx   . Colon polyps Neg Hx    History  Substance Use Topics  . Smoking status: Never Smoker   . Smokeless tobacco: Not on file  . Alcohol Use: No   OB History    No data available     Review of Systems  All other systems reviewed and are negative.   A complete 10 system review of systems was obtained and all systems are negative except as noted in the HPI and PMH.    Allergies  Bee venom and Penicillins  Home Medications   Prior to Admission medications   Medication Sig Start Date End Date Taking? Authorizing Provider  amLODipine-olmesartan (AZOR) 10-40 MG per tablet Take 1 tablet by mouth every morning.    Yes Historical Provider, MD  cloNIDine (CATAPRES) 0.2 MG tablet Take 0.2 mg by mouth 2 (two) times daily.    Yes Historical Provider, MD  cycloSPORINE (RESTASIS) 0.05 % ophthalmic emulsion Place 1 drop into both eyes daily.   Yes Historical Provider, MD  EPINEPHrine (EPI-PEN) 0.3 mg/0.3 mL DEVI Inject 0.3 mg into the muscle once.   Yes Historical Provider, MD  FLUoxetine (PROZAC) 20 MG capsule Take 20 mg by mouth  every morning.    Yes Historical Provider, MD  insulin aspart (NOVOLOG FLEXPEN) 100 UNIT/ML injection Inject 25 Units into the skin 3 (three) times daily before meals.    Yes Historical Provider, MD  insulin glargine (LANTUS) 100 UNIT/ML injection Inject 80 Units into the skin at bedtime. 05/06/12  Yes Lezlie Octave Black, NP  levothyroxine (SYNTHROID, LEVOTHROID) 100 MCG tablet Take 100 mcg by mouth daily before breakfast.   Yes Historical Provider, MD  magnesium citrate SOLN Take 1 Bottle by mouth once.   Yes Historical Provider, MD  metFORMIN (GLUCOPHAGE) 500 MG tablet Take 500 mg by mouth 2 (two) times daily with a meal.   Yes Historical Provider, MD  simvastatin (ZOCOR) 40 MG tablet Take 40 mg by mouth every evening.    Yes Historical Provider, MD  ziprasidone (GEODON) 80 MG capsule Take 80 mg by mouth at bedtime.     Yes Historical Provider, MD  zolpidem (AMBIEN) 5 MG tablet Take 5 mg by mouth at bedtime as needed for sleep.   Yes Historical Provider, MD  omeprazole (PRILOSEC) 20 MG capsule Take 1 capsule (20 mg total) by mouth daily. Patient not taking: Reported on 10/10/2014 06/02/12   Ezequiel Essex, MD  ondansetron [facility]) 4 mg TABS tablet Take 4 tablets by mouth every 8 (eight) hours as needed. 10/10/14   Nat Christen, MD  oxyCODONE-acetaminophen (PERCOCET/ROXICET) 5-325 MG per tablet Take 1 tablet by mouth every 6 (six) hours as needed. 10/10/14   Nat Christen, MD  potassium chloride SA (K-DUR,KLOR-CON) 20 MEQ tablet TAKE 1 TABLET DAILY. Patient not taking: Reported on 10/10/2014 07/27/12   Rogene Houston, MD  PYLERA [phone] MG per capsule TAKE 3 CAPSULES BY MOUTH 4 TIMES DAILY AS DIRECTED. Patient not taking: Reported on 10/10/2014 06/04/12   Rogene Houston, MD   BP 165/64 mmHg  Pulse 66  Temp(Src) 98.2 F (36.8 C)  Resp 13  Ht 5\' 4"  (1.626 m)  Wt 220 lb (99.791 kg)  BMI 37.74 kg/m2  SpO2 95% Physical Exam  Constitutional: She is oriented to person, place, and time. She appears well-developed and well-nourished.  Obese  HENT:  Head: Normocephalic and atraumatic.  Eyes: Conjunctivae and EOM are normal. Pupils are equal, round, and reactive to light.  Neck: Normal range of motion. Neck supple.  Cardiovascular: Normal rate and regular rhythm.   Pulmonary/Chest: Effort normal and breath sounds normal.  Abdominal: Soft. Bowel sounds are normal.  Minimal RUQ tenderness  Musculoskeletal: Normal range of motion.  Neurological: She is alert and oriented to person, place, and time.  Skin: Skin is warm and dry.  Psychiatric: She has a normal mood and affect. Her behavior is normal.  Nursing note and vitals reviewed.   ED Course  Procedures (including critical care time) DIAGNOSTIC STUDIES: Oxygen  Saturation is 98% on room air, normal by my interpretation.    COORDINATION OF CARE: 9:36 AM Discussed treatment plan with pt at bedside and pt agreed to plan. Pt will have basic bloodwork and CT scan of her head.     Labs Review Labs Reviewed  BASIC METABOLIC PANEL - Abnormal; Notable for the following:    Potassium 3.2 (*)    Chloride 100 (*)    Glucose, Bld 220 (*)    Calcium 8.8 (*)    All other components within normal limits  LIPASE, BLOOD - Abnormal; Notable for the following:    Lipase <10 (*)    All other components within normal limits  URINALYSIS, ROUTINE W REFLEX MICROSCOPIC (NOT AT [facility]) - Abnormal; Notable for the following:    Glucose, UA 100 (*)    Hgb urine dipstick TRACE (*)    Ketones, ur TRACE (*)    Protein, ur >300 (*)    All other components within normal limits  CBC  TROPONIN I  HEPATIC FUNCTION PANEL  URINE MICROSCOPIC-ADD ON    Imaging Review Dg Chest 2 View  10/10/2014   CLINICAL DATA:  Chest pain  EXAM: CHEST  2 VIEW  COMPARISON:  06/23/2012  FINDINGS: Cardiac enlargement. Negative for heart failure. Mild scarring in the lung bases. Negative for pneumonia or effusion. Negative for mass lesion.  IMPRESSION: No active cardiopulmonary disease.   Electronically Signed   By: Franchot Gallo M.D.   On: 10/10/2014 09:52   Ct Head Wo Contrast  10/10/2014   CLINICAL DATA:  Generalized weakness, all over body pain, shoulder and arm pain bilaterally for 2-3 weeks, history hypertension, diabetes mellitus, hypercholesterolemia  EXAM: CT HEAD WITHOUT CONTRAST  TECHNIQUE: Contiguous axial images were obtained from the base of the skull through the vertex without intravenous contrast.  COMPARISON:  01/17/2011  FINDINGS: Generalized atrophy.  Normal ventricular morphology.  No midline shift or mass effect.  Small vessel chronic ischemic changes of deep cerebral white matter.  No intracranial hemorrhage, mass lesion, or acute infarction.  Visualized paranasal sinuses and  mastoid air cells clear.  Bones unremarkable.  IMPRESSION: No acute intracranial abnormalities.   Electronically Signed   By: Lavonia Dana M.D.   On: 10/10/2014 10:47   Dg Abd 2 Views  10/10/2014   CLINICAL DATA:  Right upper quadrant pain  EXAM: ABDOMEN - 2 VIEW  COMPARISON:  06/23/2012  FINDINGS: The bowel gas pattern is normal. There is no evidence of free air. No radio-opaque calculi or other significant radiographic abnormality is seen. Cholecystectomy clips noted. Mild to moderate stool in the colon.  IMPRESSION: Mild to moderate stool in the colon without bowel obstruction.   Electronically Signed   By: Franchot Gallo M.D.   On: 10/10/2014 10:24   Mm Diag Breast Tomo Bilateral  10/10/2014   CLINICAL DATA:  Twelve month follow-up for probably benign left breast nodules.  EXAM: DIGITAL DIAGNOSTIC BILATERAL MAMMOGRAM WITH 3D TOMOSYNTHESIS AND CAD  COMPARISON:  Previous exam(s).  ACR Breast Density Category b: There are scattered areas of fibroglandular density.  FINDINGS: No suspicious mass, distortion, or microcalcifications are identified to suggest presence of malignancy. Nodules previously noted in the lateral portion of the left breast are significantly less apparent. Previous ultrasound showed small hypoechoic nodules which were smaller compared with previous studies.  Mammographic images were processed with CAD.  IMPRESSION: 1.  No mammographic evidence for malignancy. 2. Smaller left breast nodules consistent with benign process.  RECOMMENDATION: Bilateral diagnostic mammogram is recommended in 1 year to complete 2 years of follow-up.  I have discussed the findings and recommendations with the patient. Results were also provided in writing at the conclusion of the visit. If applicable, a reminder letter will be sent to the patient regarding the next appointment.  BI-RADS CATEGORY  3: Probably benign.   Electronically Signed   By: Nolon Nations M.D.   On: 10/10/2014 08:42     EKG  Interpretation None      Date: 10/10/2014  Rate: 67  Rhythm: normal sinus rhythm  QRS Axis: normal  Intervals: normal  ST/T Wave abnormalities: normal  Conduction Disutrbances: none  Narrative Interpretation: unremarkable  MDM   Final diagnoses:  Headache, unspecified headache type  Chest pain, unspecified chest pain type   Although patient complains of headache, no obvious neurological deficits or stiff neck were noted. CT head negative. Glucose minimally elevated and potassium 3.2. EKG and troponin normal. These findings were discussed with the patient and her husband. She has primary care follow-up. Discharge medications Percocet and Zofran 4 mg.   Medical screening examination/treatment/procedure(s) were performed by non-physician practitioner and as supervising physician I was immediately available for consultation/collaboration.   EKG Interpretation None        Nat Christen, MD 10/10/14 1336

## 2014-10-20 DIAGNOSIS — Z23 Encounter for immunization: Secondary | ICD-10-CM | POA: Diagnosis not present

## 2014-11-02 DIAGNOSIS — G43109 Migraine with aura, not intractable, without status migrainosus: Secondary | ICD-10-CM | POA: Diagnosis not present

## 2014-11-02 DIAGNOSIS — Z6838 Body mass index (BMI) 38.0-38.9, adult: Secondary | ICD-10-CM | POA: Diagnosis not present

## 2014-11-13 DIAGNOSIS — Z23 Encounter for immunization: Secondary | ICD-10-CM | POA: Diagnosis not present

## 2014-12-22 DIAGNOSIS — E1122 Type 2 diabetes mellitus with diabetic chronic kidney disease: Secondary | ICD-10-CM | POA: Diagnosis not present

## 2014-12-22 DIAGNOSIS — E785 Hyperlipidemia, unspecified: Secondary | ICD-10-CM | POA: Diagnosis not present

## 2014-12-22 DIAGNOSIS — E038 Other specified hypothyroidism: Secondary | ICD-10-CM | POA: Diagnosis not present

## 2014-12-22 DIAGNOSIS — I10 Essential (primary) hypertension: Secondary | ICD-10-CM | POA: Diagnosis not present

## 2014-12-22 LAB — HEMOGLOBIN A1C: Hgb A1c MFr Bld: 9 % — AB (ref 4.0–6.0)

## 2015-01-02 ENCOUNTER — Encounter: Payer: Self-pay | Admitting: "Endocrinology

## 2015-01-02 ENCOUNTER — Ambulatory Visit (INDEPENDENT_AMBULATORY_CARE_PROVIDER_SITE_OTHER): Payer: Commercial Managed Care - HMO | Admitting: "Endocrinology

## 2015-01-02 VITALS — BP 139/77 | HR 70 | Ht 64.0 in | Wt 230.0 lb

## 2015-01-02 DIAGNOSIS — E039 Hypothyroidism, unspecified: Secondary | ICD-10-CM | POA: Diagnosis not present

## 2015-01-02 DIAGNOSIS — I10 Essential (primary) hypertension: Secondary | ICD-10-CM | POA: Diagnosis not present

## 2015-01-02 DIAGNOSIS — Z794 Long term (current) use of insulin: Secondary | ICD-10-CM | POA: Diagnosis not present

## 2015-01-02 DIAGNOSIS — E782 Mixed hyperlipidemia: Secondary | ICD-10-CM | POA: Insufficient documentation

## 2015-01-02 DIAGNOSIS — E785 Hyperlipidemia, unspecified: Secondary | ICD-10-CM | POA: Diagnosis not present

## 2015-01-02 DIAGNOSIS — IMO0002 Reserved for concepts with insufficient information to code with codable children: Secondary | ICD-10-CM

## 2015-01-02 DIAGNOSIS — E1165 Type 2 diabetes mellitus with hyperglycemia: Secondary | ICD-10-CM

## 2015-01-02 DIAGNOSIS — E118 Type 2 diabetes mellitus with unspecified complications: Secondary | ICD-10-CM | POA: Diagnosis not present

## 2015-01-02 MED ORDER — INSULIN LISPRO 100 UNIT/ML (KWIKPEN): 20.0000 [IU] | PEN_INJECTOR | Freq: Every day | SUBCUTANEOUS | Status: DC

## 2015-01-02 MED ORDER — INSULIN GLARGINE 100 UNIT/ML SOLOSTAR PEN: 70.0000 [IU] | PEN_INJECTOR | Freq: Every day | SUBCUTANEOUS | Status: DC

## 2015-01-02 NOTE — Patient Instructions (Signed)

## 2015-01-02 NOTE — Progress Notes (Signed)
Subjective:    Patient ID: Holly Baldwin, female    DOB: May 18, 1945,    Past Medical History  Diagnosis Date  . Hypertension   . Diabetes mellitus   . Hypercholesteremia   . Anxiety   . GERD (gastroesophageal reflux disease)   . Hiatal hernia   . Chronic abdominal pain    Past Surgical History  Procedure Laterality Date  . Abdominal hysterectomy    . Esophagogastroduodenoscopy N/A 05/06/2012    Procedure: ESOPHAGOGASTRODUODENOSCOPY (EGD);  Surgeon: Rogene Houston, MD;  Location[facility];  Service: Endoscopy;  Laterality: N/A;  . Mass excision Right 05/26/2012    Procedure: EXCISION NEOPLASM RIGHT THIGH;  Surgeon: Jamesetta So, MD;  Location: [facility];  Service: General;  Laterality: Right;  . Colonoscopy N/A 06/23/2012    Procedure: COLONOSCOPY;  Surgeon: Rogene Houston, MD;  Location: [facility];  Service: Endoscopy;  Laterality: N/A;  100-moved to 1200 Ann to notify pt   Social History   Social History  . Marital Status: Married    Spouse Name: N/A  . Number of Children: N/A  . Years of Education: N/A   Social History Main Topics  . Smoking status: Never Smoker   . Smokeless tobacco: None  . Alcohol Use: No  . Drug Use: No  . Sexual Activity: Not Asked   Other Topics Concern  . None   Social History Narrative   Outpatient Encounter Prescriptions as of 01/02/2015  Medication Sig  . Insulin Glargine (LANTUS) 100 UNIT/ML Solostar Pen Inject 70 Units into the skin daily at 10 pm.  . insulin lispro (HUMALOG) 100 UNIT/ML KiwkPen Inject 0.2-0.26 mLs (20-26 Units total) into the skin at bedtime.  Marland Kitchen levothyroxine (SYNTHROID, LEVOTHROID) 100 MCG tablet Take 100 mcg by mouth daily before breakfast.  . metFORMIN (GLUCOPHAGE) 500 MG tablet Take 500 mg by mouth 2 (two) times daily with a meal.  . [DISCONTINUED] Insulin Glargine (LANTUS) 100 UNIT/ML Solostar Pen Inject 70 Units into the skin daily at 10 pm.  . [DISCONTINUED] insulin lispro (HUMALOG) 100 UNIT/ML  KiwkPen Inject 20-26 Units into the skin at bedtime.  Marland Kitchen amLODipine-olmesartan (AZOR) 10-40 MG per tablet Take 1 tablet by mouth every morning.   . cloNIDine (CATAPRES) 0.2 MG tablet Take 0.2 mg by mouth 2 (two) times daily.   . cycloSPORINE (RESTASIS) 0.05 % ophthalmic emulsion Place 1 drop into both eyes daily.  Marland Kitchen EPINEPHrine (EPI-PEN) 0.3 mg/0.3 mL DEVI Inject 0.3 mg into the muscle once.  Marland Kitchen FLUoxetine (PROZAC) 20 MG capsule Take 20 mg by mouth every morning.   . magnesium citrate SOLN Take 1 Bottle by mouth once.  Marland Kitchen omeprazole (PRILOSEC) 20 MG capsule Take 1 capsule (20 mg total) by mouth daily. (Patient not taking: Reported on 10/10/2014)  . ondansetron (ZOFRAN) 4 mg TABS tablet Take 4 tablets by mouth every 8 (eight) hours as needed.  Marland Kitchen oxyCODONE-acetaminophen (PERCOCET/ROXICET) 5-325 MG per tablet Take 1 tablet by mouth every 6 (six) hours as needed.  . potassium chloride SA (K-DUR,KLOR-CON) 20 MEQ tablet TAKE 1 TABLET DAILY. (Patient not taking: Reported on 10/10/2014)  . PYLERA 140-125-125 MG per capsule TAKE 3 CAPSULES BY MOUTH 4 TIMES DAILY AS DIRECTED. (Patient not taking: Reported on 10/10/2014)  . simvastatin (ZOCOR) 40 MG tablet Take 40 mg by mouth every evening.  . ziprasidone (GEODON) 80 MG capsule Take 80 mg by mouth at bedtime.    Marland Kitchen zolpidem (AMBIEN) 5 MG tablet Take 5 mg  by mouth at bedtime as needed for sleep.  . [DISCONTINUED] insulin aspart (NOVOLOG FLEXPEN) 100 UNIT/ML injection Inject 25 Units into the skin 3 (three) times daily before meals.   . [DISCONTINUED] insulin glargine (LANTUS) 100 UNIT/ML injection Inject 80 Units into the skin at bedtime.   No facility-administered encounter medications on file as of 01/02/2015.   ALLERGIES: Allergies  Allergen Reactions  . Bee Venom Anaphylaxis  . Ace Inhibitors   . Penicillins Hives   VACCINATION STATUS:  There is no immunization history on file for this patient.  Diabetes She presents for her follow-up diabetic visit.  She has type 2 diabetes mellitus. Onset time: she was diagnosed at approximate age of 41 years. Her disease course has been improving. There are no hypoglycemic associated symptoms. Pertinent negatives for hypoglycemia include no confusion, headaches, pallor or seizures. Associated symptoms include fatigue and polydipsia. Pertinent negatives for diabetes include no chest pain, no polyphagia and no polyuria. There are no hypoglycemic complications. Symptoms are improving. Diabetic complications include autonomic neuropathy. Risk factors for coronary artery disease include diabetes mellitus, dyslipidemia, obesity and sedentary lifestyle. She is following a generally unhealthy diet. She has not had a previous visit with a dietitian. She never participates in exercise. Her home blood glucose trend is decreasing steadily. Her overall blood glucose range is 140-180 mg/dl. An ACE inhibitor/angiotensin II receptor blocker is being taken.  Thyroid Problem Presents for follow-up visit. Symptoms include fatigue. Patient reports no cold intolerance, diarrhea, heat intolerance or palpitations. The symptoms have been improving. Past treatments include levothyroxine. Her past medical history is significant for hyperlipidemia.  Hyperlipidemia This is a chronic problem. The current episode started more than 1 year ago. Pertinent negatives include no chest pain, myalgias or shortness of breath. Current antihyperlipidemic treatment includes statins.  Hypertension This is a chronic problem. The current episode started more than 1 year ago. The problem is controlled. Pertinent negatives include no chest pain, headaches, palpitations or shortness of breath. Past treatments include angiotensin blockers. Hypertensive end-organ damage includes a thyroid problem.     Review of Systems  Constitutional: Positive for fatigue. Negative for unexpected weight change.  HENT: Negative for trouble swallowing and voice change.   Eyes:  Negative for visual disturbance.  Respiratory: Negative for cough, shortness of breath and wheezing.   Cardiovascular: Negative for chest pain, palpitations and leg swelling.  Gastrointestinal: Negative for nausea, vomiting and diarrhea.  Endocrine: Positive for polydipsia. Negative for cold intolerance, heat intolerance, polyphagia and polyuria.  Musculoskeletal: Negative for myalgias and arthralgias.  Skin: Negative for color change, pallor, rash and wound.  Neurological: Negative for seizures and headaches.  Psychiatric/Behavioral: Negative for suicidal ideas and confusion.    Objective:    BP 139/77 mmHg  Pulse 70  Ht 5\' 4"  (1.626 m)  Wt 230 lb (104.327 kg)  BMI 39.46 kg/m2  SpO2 97%  Wt Readings from Last 3 Encounters:  01/02/15 230 lb (104.327 kg)  10/10/14 220 lb (99.791 kg)  06/23/12 220 lb (99.791 kg)    Physical Exam  Constitutional: She is oriented to person, place, and time. She appears well-developed.  HENT:  Head: Normocephalic and atraumatic.  Eyes: EOM are normal.  Neck: Normal range of motion. Neck supple. No tracheal deviation present. No thyromegaly present.  Cardiovascular: Normal rate and regular rhythm.   Pulmonary/Chest: Effort normal and breath sounds normal.  Abdominal: Soft. Bowel sounds are normal. There is no tenderness. There is no guarding.  Musculoskeletal: Normal range of motion. She  exhibits no edema.  Neurological: She is alert and oriented to person, place, and time. She has normal reflexes. No cranial nerve deficit. Coordination normal.  Skin: Skin is warm and dry. No rash noted. No erythema. No pallor.  Psychiatric: She has a normal mood and affect. Judgment normal.    Results for orders placed or performed during the hospital encounter of [id number]  Basic metabolic panel  Result Value Ref Range   Sodium 140 135 - 145 mmol/L   Potassium 3.2 (L) 3.5 - 5.1 mmol/L   Chloride 100 (L) 101 - 111 mmol/L   CO2 28 22 - 32 mmol/L   Glucose, Bld  220 (H) 65 - 99 mg/dL   BUN 13 6 - 20 mg/dL   Creatinine, Ser 0.80 0.44 - 1.00 mg/dL   Calcium 8.8 (L) 8.9 - 10.3 mg/dL   GFR calc non Af Amer >60 >60 mL/min   GFR calc Af Amer >60 >60 mL/min   Anion gap 12 5 - 15  CBC  Result Value Ref Range   WBC 9.1 4.0 - 10.5 K/uL   RBC 4.77 3.87 - 5.11 MIL/uL   Hemoglobin 13.0 12.0 - 15.0 g/dL   HCT 39.9 36.0 - 46.0 %   MCV 83.6 78.0 - 100.0 fL   MCH 27.3 26.0 - 34.0 pg   MCHC 32.6 30.0 - 36.0 g/dL   RDW 15.2 11.5 - 15.5 %   Platelets 295 150 - 400 K/uL  Troponin I  Result Value Ref Range   Troponin I <0.03 <0.031 ng/mL  Hepatic function panel  Result Value Ref Range   Total Protein 6.7 6.5 - 8.1 g/dL   Albumin 3.5 3.5 - 5.0 g/dL   AST 27 15 - 41 U/L   ALT 24 14 - 54 U/L   Alkaline Phosphatase 107 38 - 126 U/L   Total Bilirubin 0.4 0.3 - 1.2 mg/dL   Bilirubin, Direct 0.1 0.1 - 0.5 mg/dL   Indirect Bilirubin 0.3 0.3 - 0.9 mg/dL  Lipase, blood  Result Value Ref Range   Lipase <10 (L) 22 - 51 U/L  Urinalysis, Routine w reflex microscopic (not at [facility])  Result Value Ref Range   Color, Urine YELLOW YELLOW   APPearance CLEAR CLEAR   Specific Gravity, Urine 1.020 1.005 - 1.030   pH 6.0 5.0 - 8.0   Glucose, UA 100 (A) NEGATIVE mg/dL   Hgb urine dipstick TRACE (A) NEGATIVE   Bilirubin Urine NEGATIVE NEGATIVE   Ketones, ur TRACE (A) NEGATIVE mg/dL   Protein, ur >300 (A) NEGATIVE mg/dL   Urobilinogen, UA 1.0 0.0 - 1.0 mg/dL   Nitrite NEGATIVE NEGATIVE   Leukocytes, UA NEGATIVE NEGATIVE  Urine microscopic-add on  Result Value Ref Range   Squamous Epithelial / LPF RARE RARE   RBC / HPF 0-2 <3 RBC/hpf   Complete Blood Count (Most recent): Lab Results  Component Value Date   WBC 9.1 10/10/2014   HGB 13.0 10/10/2014   HCT 39.9 10/10/2014   MCV 83.6 10/10/2014   PLT 295 10/10/2014   Chemistry (most recent): Lab Results  Component Value Date   NA 140 10/10/2014   K 3.2* 10/10/2014   CL 100* 10/10/2014   CO2 28 10/10/2014   BUN  13 10/10/2014   CREATININE 0.80 10/10/2014   Diabetic Labs (most recent): No results found for: HGBA1C Lipid profile (most recent): No results found for: TRIG, CHOL       Assessment & Plan:   1. Uncontrolled type  2 diabetes mellitus with complication, with long-term current use of insulin (HCC)  Her diabetes is  complicated by neuropathy and patient remains at a high risk for more acute and chronic complications of diabetes which include CAD, CVA, CKD, retinopathy, and neuropathy. These are all discussed in detail with the patient.  Patient came with improved glucose profile, and  recent A1c of 9 %.  Glucose logs and insulin administration records pertaining to this visit,  to be scanned into patient's records.  Recent labs reviewed.   - I have re-counseled the patient on diet management and weight loss  by adopting a carbohydrate restricted / protein rich  Diet.  - Suggestion is made for patient to avoid simple carbohydrates   from their diet including Cakes , Desserts, Ice Cream,  Soda (  diet and regular) , Sweet Tea , Candies,  Chips, Cookies, Artificial Sweeteners,   and "Sugar-free" Products .  This will help patient to have stable blood glucose profile and potentially avoid unintended  Weight gain.  - Patient is advised to stick to a routine mealtimes to eat 3 meals  a day and avoid unnecessary snacks ( to snack only to correct hypoglycemia).  - The patient  Will be  scheduled with Jearld Fenton, RDN, CDE for individualized DM education.  - I have approached patient with the following individualized plan to manage diabetes and patient agrees.  Continue Lantus 70 units qhs, Humalog 20 units TIDAC plus correction.  Continue Metformin 500mg  po BID.  -Adjustment parameters are given for hypo and hyperglycemia in writing. -Patient is encouraged to call clinic for blood glucose levels less than 70 or above 300 mg /dl.  - Patient specific target  for A1c; LDL, HDL, Triglycerides,  and  Waist Circumference were discussed in detail.  2) BP/HTN: Controlled. Continue current medications including ACEI/ARB. 3) Lipids/HPL:  continue statins. 4)  Weight/Diet: CDE consult in progress, exercise, and carbohydrates information provided.  5) hypothyroidism: I advised her to continue levothyroxine 100 g by mouth daily.   - We discussed about correct intake of levothyroxine, at fasting, with water, separated by at least 30 minutes from breakfast, and separated by more than 4 hours from calcium, iron, multivitamins, acid reflux medications (PPIs). -Patient is made aware of the fact that thyroid hormone replacement is needed for life, dose to be adjusted by periodic monitoring of thyroid function tests.  6) Chronic Care/Health Maintenance:  -Patient is on ACEI/ARB and Statin medications and encouraged to continue to follow up with Ophthalmology, Podiatrist at least yearly or according to recommendations, and advised to  stay away from smoking. I have recommended yearly flu vaccine and pneumonia vaccination at least every 5 years; moderate intensity exercise for up to 150 minutes weekly; and  sleep for at least 7 hours a day.  I advised patient to maintain close follow up with their PCP for primary care needs.  Patient is asked to bring meter and  blood glucose logs during their next visit.   Follow up plan: Return in about 3 months (around 04/04/2015) for diabetes, high blood pressure, high cholesterol, underactive thyroid.  Glade Lloyd, MD Phone: [phone]  Fax: [phone]   01/02/2015, 9:28 PM

## 2015-01-05 ENCOUNTER — Other Ambulatory Visit: Payer: Self-pay

## 2015-01-05 MED ORDER — INSULIN LISPRO 100 UNIT/ML (KWIKPEN): 20.0000 [IU] | PEN_INJECTOR | Freq: Three times a day (TID) | SUBCUTANEOUS | Status: DC

## 2015-01-24 DIAGNOSIS — Z6837 Body mass index (BMI) 37.0-37.9, adult: Secondary | ICD-10-CM | POA: Diagnosis not present

## 2015-01-24 DIAGNOSIS — J209 Acute bronchitis, unspecified: Secondary | ICD-10-CM | POA: Diagnosis not present

## 2015-01-24 DIAGNOSIS — Z1389 Encounter for screening for other disorder: Secondary | ICD-10-CM | POA: Diagnosis not present

## 2015-01-24 DIAGNOSIS — J301 Allergic rhinitis due to pollen: Secondary | ICD-10-CM | POA: Diagnosis not present

## 2015-02-08 DIAGNOSIS — H5213 Myopia, bilateral: Secondary | ICD-10-CM | POA: Diagnosis not present

## 2015-02-08 DIAGNOSIS — E1129 Type 2 diabetes mellitus with other diabetic kidney complication: Secondary | ICD-10-CM | POA: Diagnosis not present

## 2015-02-08 DIAGNOSIS — H5210 Myopia, unspecified eye: Secondary | ICD-10-CM | POA: Diagnosis not present

## 2015-02-28 ENCOUNTER — Ambulatory Visit: Payer: Self-pay | Admitting: Nutrition

## 2015-03-07 ENCOUNTER — Other Ambulatory Visit: Payer: Self-pay | Admitting: "Endocrinology

## 2015-04-02 ENCOUNTER — Other Ambulatory Visit: Payer: Self-pay | Admitting: "Endocrinology

## 2015-04-02 DIAGNOSIS — E118 Type 2 diabetes mellitus with unspecified complications: Secondary | ICD-10-CM | POA: Diagnosis not present

## 2015-04-02 DIAGNOSIS — E1165 Type 2 diabetes mellitus with hyperglycemia: Secondary | ICD-10-CM | POA: Diagnosis not present

## 2015-04-02 DIAGNOSIS — E039 Hypothyroidism, unspecified: Secondary | ICD-10-CM | POA: Diagnosis not present

## 2015-04-02 DIAGNOSIS — Z794 Long term (current) use of insulin: Secondary | ICD-10-CM | POA: Diagnosis not present

## 2015-04-02 LAB — TSH: TSH: 4.57 u[IU]/mL — ABNORMAL HIGH (ref 0.350–4.500)

## 2015-04-02 LAB — HEMOGLOBIN A1C
Hgb A1c MFr Bld: 8.2 % — ABNORMAL HIGH (ref <5.7)
Mean Plasma Glucose: 189 mg/dL — ABNORMAL HIGH (ref <117)

## 2015-04-02 LAB — BASIC METABOLIC PANEL
BUN: 12 mg/dL (ref 7–25)
CO2: 27 mmol/L (ref 20–31)
Calcium: 9.4 mg/dL (ref 8.6–10.4)
Chloride: 105 mmol/L (ref 98–110)
Creat: 0.73 mg/dL (ref 0.60–0.93)
Glucose, Bld: 253 mg/dL — ABNORMAL HIGH (ref 65–99)
Potassium: 3.8 mmol/L (ref 3.5–5.3)
Sodium: 144 mmol/L (ref 135–146)

## 2015-04-02 LAB — T4, FREE: Free T4: 1.15 ng/dL (ref 0.80–1.80)

## 2015-04-05 ENCOUNTER — Encounter: Payer: Self-pay | Admitting: "Endocrinology

## 2015-04-05 ENCOUNTER — Ambulatory Visit (INDEPENDENT_AMBULATORY_CARE_PROVIDER_SITE_OTHER): Payer: Commercial Managed Care - HMO | Admitting: "Endocrinology

## 2015-04-05 VITALS — BP 148/82 | HR 72 | Ht 64.0 in | Wt 229.0 lb

## 2015-04-05 DIAGNOSIS — E785 Hyperlipidemia, unspecified: Secondary | ICD-10-CM

## 2015-04-05 DIAGNOSIS — E118 Type 2 diabetes mellitus with unspecified complications: Secondary | ICD-10-CM

## 2015-04-05 DIAGNOSIS — E1165 Type 2 diabetes mellitus with hyperglycemia: Secondary | ICD-10-CM | POA: Diagnosis not present

## 2015-04-05 DIAGNOSIS — E039 Hypothyroidism, unspecified: Secondary | ICD-10-CM

## 2015-04-05 DIAGNOSIS — I10 Essential (primary) hypertension: Secondary | ICD-10-CM

## 2015-04-05 DIAGNOSIS — Z794 Long term (current) use of insulin: Secondary | ICD-10-CM

## 2015-04-05 DIAGNOSIS — IMO0002 Reserved for concepts with insufficient information to code with codable children: Secondary | ICD-10-CM

## 2015-04-05 MED ORDER — LEVOTHYROXINE SODIUM 125 MCG PO TABS: 125.0000 ug | ORAL_TABLET | Freq: Every day | ORAL | Status: DC

## 2015-04-05 NOTE — Patient Instructions (Signed)

## 2015-04-05 NOTE — Progress Notes (Signed)
Subjective:    Patient ID: Holly Baldwin, female    DOB: Aug 04, 1945,    Past Medical History  Diagnosis Date  . Hypertension   . Diabetes mellitus   . Hypercholesteremia   . Anxiety   . GERD (gastroesophageal reflux disease)   . Hiatal hernia   . Chronic abdominal pain    Past Surgical History  Procedure Laterality Date  . Abdominal hysterectomy    . Esophagogastroduodenoscopy N/A 05/06/2012    Procedure: ESOPHAGOGASTRODUODENOSCOPY (EGD);  Surgeon: Rogene Houston, MD;  Location: [facility];  Service: Endoscopy;  Laterality: N/A;  . Mass excision Right 05/26/2012    Procedure: EXCISION NEOPLASM RIGHT THIGH;  Surgeon: Jamesetta So, MD;  Location: [facility];  Service: General;  Laterality: Right;  . Colonoscopy N/A 06/23/2012    Procedure: COLONOSCOPY;  Surgeon: Rogene Houston, MD;  Location: [facility];  Service: Endoscopy;  Laterality: N/A;  100-moved to 1200 Ann to notify pt   Social History   Social History  . Marital Status: Married    Spouse Name: N/A  . Number of Children: N/A  . Years of Education: N/A   Social History Main Topics  . Smoking status: Never Smoker   . Smokeless tobacco: None  . Alcohol Use: No  . Drug Use: No  . Sexual Activity: Not Asked   Other Topics Concern  . None   Social History Narrative   Outpatient Encounter Prescriptions as of 04/05/2015  Medication Sig  . Insulin Glargine (LANTUS SOLOSTAR) 100 UNIT/ML Solostar Pen 70 units qhs  . insulin lispro (HUMALOG) 100 UNIT/ML KiwkPen Inject 0.2-0.26 mLs (20-26 Units total) into the skin 3 (three) times daily before meals.  Marland Kitchen levothyroxine (SYNTHROID, LEVOTHROID) 125 MCG tablet Take 1 tablet (125 mcg total) by mouth daily before breakfast.  . metFORMIN (GLUCOPHAGE) 500 MG tablet Take 500 mg by mouth 2 (two) times daily with a meal.  . [DISCONTINUED] levothyroxine (SYNTHROID, LEVOTHROID) 100 MCG tablet Take 100 mcg by mouth daily before breakfast.  . amLODipine-olmesartan (AZOR) 10-40 MG  per tablet Take 1 tablet by mouth every morning.   . cloNIDine (CATAPRES) 0.2 MG tablet Take 0.2 mg by mouth 2 (two) times daily.   . cycloSPORINE (RESTASIS) 0.05 % ophthalmic emulsion Place 1 drop into both eyes daily.  Marland Kitchen EPINEPHrine (EPI-PEN) 0.3 mg/0.3 mL DEVI Inject 0.3 mg into the muscle once.  Marland Kitchen FLUoxetine (PROZAC) 20 MG capsule Take 20 mg by mouth every morning.   . magnesium citrate SOLN Take 1 Bottle by mouth once.  Marland Kitchen omeprazole (PRILOSEC) 20 MG capsule Take 1 capsule (20 mg total) by mouth daily. (Patient not taking: Reported on 10/10/2014)  . ondansetron (ZOFRAN) 4 mg TABS tablet Take 4 tablets by mouth every 8 (eight) hours as needed.  Marland Kitchen oxyCODONE-acetaminophen (PERCOCET/ROXICET) 5-325 MG per tablet Take 1 tablet by mouth every 6 (six) hours as needed.  . potassium chloride SA (K-DUR,KLOR-CON) 20 MEQ tablet TAKE 1 TABLET DAILY. (Patient not taking: Reported on 10/10/2014)  . PYLERA 140-125-125 MG per capsule TAKE 3 CAPSULES BY MOUTH 4 TIMES DAILY AS DIRECTED. (Patient not taking: Reported on 10/10/2014)  . simvastatin (ZOCOR) 40 MG tablet Take 40 mg by mouth every evening.  . ziprasidone (GEODON) 80 MG capsule Take 80 mg by mouth at bedtime.    Marland Kitchen zolpidem (AMBIEN) 5 MG tablet Take 5 mg by mouth at bedtime as needed for sleep.   No facility-administered encounter medications on file as of 04/05/2015.  ALLERGIES: Allergies  Allergen Reactions  . Bee Venom Anaphylaxis  . Ace Inhibitors   . Penicillins Hives   VACCINATION STATUS:  There is no immunization history on file for this patient.  Diabetes She presents for her follow-up diabetic visit. She has type 2 diabetes mellitus. Onset time: she was diagnosed at approximate age of 24 years. Her disease course has been improving. There are no hypoglycemic associated symptoms. Pertinent negatives for hypoglycemia include no confusion, headaches, pallor or seizures. Associated symptoms include fatigue and polydipsia. Pertinent negatives for  diabetes include no chest pain, no polyphagia and no polyuria. There are no hypoglycemic complications. Symptoms are improving. Diabetic complications include autonomic neuropathy. Risk factors for coronary artery disease include diabetes mellitus, dyslipidemia, obesity and sedentary lifestyle. She is following a generally unhealthy diet. She has not had a previous visit with a dietitian. She never participates in exercise. Her home blood glucose trend is decreasing steadily. Her breakfast blood glucose range is generally 140-180 mg/dl. Her lunch blood glucose range is generally 140-180 mg/dl. Her dinner blood glucose range is generally 140-180 mg/dl. Her overall blood glucose range is 140-180 mg/dl. An ACE inhibitor/angiotensin II receptor blocker is being taken.  Thyroid Problem Presents for follow-up visit. Symptoms include fatigue. Patient reports no cold intolerance, diarrhea, heat intolerance or palpitations. The symptoms have been improving. Past treatments include levothyroxine. Her past medical history is significant for hyperlipidemia.  Hyperlipidemia This is a chronic problem. The current episode started more than 1 year ago. Pertinent negatives include no chest pain, myalgias or shortness of breath. Current antihyperlipidemic treatment includes statins.  Hypertension This is a chronic problem. The current episode started more than 1 year ago. The problem is controlled. Pertinent negatives include no chest pain, headaches, palpitations or shortness of breath. Past treatments include angiotensin blockers. Hypertensive end-organ damage includes a thyroid problem.     Review of Systems  Constitutional: Positive for fatigue. Negative for unexpected weight change.  HENT: Negative for trouble swallowing and voice change.   Eyes: Negative for visual disturbance.  Respiratory: Negative for cough, shortness of breath and wheezing.   Cardiovascular: Negative for chest pain, palpitations and leg  swelling.  Gastrointestinal: Negative for nausea, vomiting and diarrhea.  Endocrine: Positive for polydipsia. Negative for cold intolerance, heat intolerance, polyphagia and polyuria.  Musculoskeletal: Negative for myalgias and arthralgias.  Skin: Negative for color change, pallor, rash and wound.  Neurological: Negative for seizures and headaches.  Psychiatric/Behavioral: Negative for suicidal ideas and confusion.    Objective:    BP 148/82 mmHg  Pulse 72  Ht 5\' 4"  (1.626 m)  Wt 229 lb (103.874 kg)  BMI 39.29 kg/m2  SpO2 98%  Wt Readings from Last 3 Encounters:  04/05/15 229 lb (103.874 kg)  01/02/15 230 lb (104.327 kg)  10/10/14 220 lb (99.791 kg)    Physical Exam  Constitutional: She is oriented to person, place, and time. She appears well-developed.  HENT:  Head: Normocephalic and atraumatic.  Eyes: EOM are normal.  Neck: Normal range of motion. Neck supple. No tracheal deviation present. No thyromegaly present.  Cardiovascular: Normal rate and regular rhythm.   Pulmonary/Chest: Effort normal and breath sounds normal.  Abdominal: Soft. Bowel sounds are normal. There is no tenderness. There is no guarding.  Musculoskeletal: Normal range of motion. She exhibits no edema.  Neurological: She is alert and oriented to person, place, and time. She has normal reflexes. No cranial nerve deficit. Coordination normal.  Skin: Skin is warm and dry. No rash  noted. No erythema. No pallor.  Psychiatric: She has a normal mood and affect. Judgment normal.    Results for orders placed or performed in visit on [id number]  Basic metabolic panel  Result Value Ref Range   Sodium 144 135 - 146 mmol/L   Potassium 3.8 3.5 - 5.3 mmol/L   Chloride 105 98 - 110 mmol/L   CO2 27 20 - 31 mmol/L   Glucose, Bld 253 (H) 65 - 99 mg/dL   BUN 12 7 - 25 mg/dL   Creat 0.73 0.60 - 0.93 mg/dL   Calcium 9.4 8.6 - 10.4 mg/dL  TSH  Result Value Ref Range   TSH 4.570 (H) 0.350 - 4.500 uIU/mL  T4, free   Result Value Ref Range   Free T4 1.15 0.80 - 1.80 ng/dL  Hemoglobin A1c  Result Value Ref Range   Hgb A1c MFr Bld 8.2 (H) <5.7 %   Mean Plasma Glucose 189 (H) <117 mg/dL   Diabetic Labs (most recent): Lab Results  Component Value Date   HGBA1C 8.2* 04/02/2015   HGBA1C 9.0* 12/22/2014     Assessment & Plan:   1. Uncontrolled type 2 diabetes mellitus with complication, with long-term current use of insulin ([place])  Her diabetes is  complicated by neuropathy and patient remains at a high risk for more acute and chronic complications of diabetes which include CAD, CVA, CKD, retinopathy, and neuropathy. These are all discussed in detail with the patient.  Patient came with improved glucose profile, and  recent A1c of 8.2% improving from 9 %.  Glucose logs and insulin administration records pertaining to this visit,  to be scanned into patient's records.  Recent labs reviewed.   - I have re-counseled the patient on diet management and weight loss  by adopting a carbohydrate restricted / protein rich  Diet.  - Suggestion is made for patient to avoid simple carbohydrates   from their diet including Cakes , Desserts, Ice Cream,  Soda (  diet and regular) , Sweet Tea , Candies,  Chips, Cookies, Artificial Sweeteners,   and "Sugar-free" Products .  This will help patient to have stable blood glucose profile and potentially avoid unintended  Weight gain.  - Patient is advised to stick to a routine mealtimes to eat 3 meals  a day and avoid unnecessary snacks ( to snack only to correct hypoglycemia).  - The patient  Will be  scheduled with Jearld Fenton, RDN, CDE for individualized DM education.  - I have approached patient with the following individualized plan to manage diabetes and patient agrees.  - Continue Lantus 80 units qhs, Humalog 20 units TIDAC plus correction.  - Continue Metformin 500mg  po BID.  -Adjustment parameters are given for hypo and hyperglycemia in writing. -Patient is  encouraged to call clinic for blood glucose levels less than 70 or above 300 mg /dl.  - Patient specific target  for A1c; LDL, HDL, Triglycerides, and  Waist Circumference were discussed in detail.  2) BP/HTN: Controlled. Continue current medications including ACEI/ARB. 3) Lipids/HPL:  continue statins. 4)  Weight/Diet: CDE consult in progress, exercise, and carbohydrates information provided.  5) hypothyroidism: -Her labs are consistent with inadequate replacement with thyroid hormone. -I will increase her levothyroxine to 125 g by mouth every morning. - We discussed about correct intake of levothyroxine, at fasting, with water, separated by at least 30 minutes from breakfast, and separated by more than 4 hours from calcium, iron, multivitamins, acid reflux medications (PPIs). -Patient is made  aware of the fact that thyroid hormone replacement is needed for life, dose to be adjusted by periodic monitoring of thyroid function tests.  6) Chronic Care/Health Maintenance:  -Patient is on ACEI/ARB and Statin medications and encouraged to continue to follow up with Ophthalmology, Podiatrist at least yearly or according to recommendations, and advised to  stay away from smoking. I have recommended yearly flu vaccine and pneumonia vaccination at least every 5 years; moderate intensity exercise for up to 150 minutes weekly; and  sleep for at least 7 hours a day.  I advised patient to maintain close follow up with her PCP for primary care needs.  Patient is asked to bring meter and  blood glucose logs during their next visit.   Follow up plan: Return in about 3 months (around 07/03/2015) for diabetes, high blood pressure, high cholesterol, underactive thyroid, follow up with pre-visit labs, meter, and logs.  Glade Lloyd, MD Phone: [phone]  Fax: [phone]   04/05/2015, 10:43 AM

## 2015-04-06 ENCOUNTER — Other Ambulatory Visit: Payer: Self-pay | Admitting: "Endocrinology

## 2015-04-18 ENCOUNTER — Other Ambulatory Visit: Payer: Self-pay | Admitting: "Endocrinology

## 2015-04-24 DIAGNOSIS — E784 Other hyperlipidemia: Secondary | ICD-10-CM | POA: Diagnosis not present

## 2015-04-24 DIAGNOSIS — Z6838 Body mass index (BMI) 38.0-38.9, adult: Secondary | ICD-10-CM | POA: Diagnosis not present

## 2015-04-24 DIAGNOSIS — E1129 Type 2 diabetes mellitus with other diabetic kidney complication: Secondary | ICD-10-CM | POA: Diagnosis not present

## 2015-04-24 DIAGNOSIS — I10 Essential (primary) hypertension: Secondary | ICD-10-CM | POA: Diagnosis not present

## 2015-04-24 DIAGNOSIS — Z1389 Encounter for screening for other disorder: Secondary | ICD-10-CM | POA: Diagnosis not present

## 2015-04-24 DIAGNOSIS — Z0000 Encounter for general adult medical examination without abnormal findings: Secondary | ICD-10-CM | POA: Diagnosis not present

## 2015-05-07 DIAGNOSIS — Z1389 Encounter for screening for other disorder: Secondary | ICD-10-CM | POA: Diagnosis not present

## 2015-05-07 DIAGNOSIS — E119 Type 2 diabetes mellitus without complications: Secondary | ICD-10-CM | POA: Diagnosis not present

## 2015-05-07 DIAGNOSIS — J029 Acute pharyngitis, unspecified: Secondary | ICD-10-CM | POA: Diagnosis not present

## 2015-05-07 DIAGNOSIS — J019 Acute sinusitis, unspecified: Secondary | ICD-10-CM | POA: Diagnosis not present

## 2015-05-07 DIAGNOSIS — Z6839 Body mass index (BMI) 39.0-39.9, adult: Secondary | ICD-10-CM | POA: Diagnosis not present

## 2015-05-21 ENCOUNTER — Other Ambulatory Visit: Payer: Self-pay | Admitting: "Endocrinology

## 2015-05-25 ENCOUNTER — Other Ambulatory Visit: Payer: Self-pay

## 2015-05-25 MED ORDER — INSULIN ASPART 100 UNIT/ML FLEXPEN: 26.0000 [IU] | PEN_INJECTOR | Freq: Three times a day (TID) | SUBCUTANEOUS | Status: DC

## 2015-06-12 ENCOUNTER — Other Ambulatory Visit: Payer: Self-pay | Admitting: "Endocrinology

## 2015-07-02 ENCOUNTER — Other Ambulatory Visit: Payer: Self-pay | Admitting: "Endocrinology

## 2015-07-02 DIAGNOSIS — E118 Type 2 diabetes mellitus with unspecified complications: Secondary | ICD-10-CM | POA: Diagnosis not present

## 2015-07-02 DIAGNOSIS — E1165 Type 2 diabetes mellitus with hyperglycemia: Secondary | ICD-10-CM | POA: Diagnosis not present

## 2015-07-02 DIAGNOSIS — Z794 Long term (current) use of insulin: Secondary | ICD-10-CM | POA: Diagnosis not present

## 2015-07-02 DIAGNOSIS — I10 Essential (primary) hypertension: Secondary | ICD-10-CM | POA: Diagnosis not present

## 2015-07-02 LAB — TSH: TSH: 1.85 mIU/L

## 2015-07-02 LAB — HEMOGLOBIN A1C
Hgb A1c MFr Bld: 7.7 % — ABNORMAL HIGH (ref <5.7)
Mean Plasma Glucose: 174 mg/dL

## 2015-07-02 LAB — BASIC METABOLIC PANEL
BUN: 13 mg/dL (ref 7–25)
CO2: 24 mmol/L (ref 20–31)
Calcium: 8.6 mg/dL (ref 8.6–10.4)
Chloride: 108 mmol/L (ref 98–110)
Creat: 0.69 mg/dL (ref 0.60–0.93)
Glucose, Bld: 192 mg/dL — ABNORMAL HIGH (ref 65–99)
Potassium: 3.7 mmol/L (ref 3.5–5.3)
Sodium: 141 mmol/L (ref 135–146)

## 2015-07-02 LAB — T4, FREE: Free T4: 1.1 ng/dL (ref 0.8–1.8)

## 2015-07-10 ENCOUNTER — Encounter: Payer: Self-pay | Admitting: "Endocrinology

## 2015-07-10 ENCOUNTER — Ambulatory Visit (INDEPENDENT_AMBULATORY_CARE_PROVIDER_SITE_OTHER): Payer: Commercial Managed Care - HMO | Admitting: "Endocrinology

## 2015-07-10 VITALS — BP 150/68 | HR 80 | Ht 64.0 in | Wt 232.0 lb

## 2015-07-10 DIAGNOSIS — IMO0002 Reserved for concepts with insufficient information to code with codable children: Secondary | ICD-10-CM

## 2015-07-10 DIAGNOSIS — Z794 Long term (current) use of insulin: Secondary | ICD-10-CM | POA: Diagnosis not present

## 2015-07-10 DIAGNOSIS — E1165 Type 2 diabetes mellitus with hyperglycemia: Secondary | ICD-10-CM

## 2015-07-10 DIAGNOSIS — I10 Essential (primary) hypertension: Secondary | ICD-10-CM | POA: Diagnosis not present

## 2015-07-10 DIAGNOSIS — E118 Type 2 diabetes mellitus with unspecified complications: Secondary | ICD-10-CM

## 2015-07-10 DIAGNOSIS — Z6838 Body mass index (BMI) 38.0-38.9, adult: Secondary | ICD-10-CM

## 2015-07-10 DIAGNOSIS — E039 Hypothyroidism, unspecified: Secondary | ICD-10-CM | POA: Diagnosis not present

## 2015-07-10 DIAGNOSIS — E785 Hyperlipidemia, unspecified: Secondary | ICD-10-CM

## 2015-07-10 NOTE — Patient Instructions (Signed)

## 2015-07-10 NOTE — Progress Notes (Signed)
Subjective:    Patient ID: Holly Baldwin, female    DOB: [birth year],    Past Medical History  Diagnosis Date  . Hypertension   . Diabetes mellitus   . Hypercholesteremia   . Anxiety   . GERD (gastroesophageal reflux disease)   . Hiatal hernia   . Chronic abdominal pain    Past Surgical History  Procedure Laterality Date  . Abdominal hysterectomy    . Esophagogastroduodenoscopy N/A 05/06/2012    Procedure: ESOPHAGOGASTRODUODENOSCOPY (EGD);  Surgeon: Rogene Houston, MD;  Location: [facility];  Service: Endoscopy;  Laterality: N/A;  . Mass excision Right 05/26/2012    Procedure: EXCISION NEOPLASM RIGHT THIGH;  Surgeon: Jamesetta So, MD;  Location: [facility];  Service: General;  Laterality: Right;  . Colonoscopy N/A 06/23/2012    Procedure: COLONOSCOPY;  Surgeon: Rogene Houston, MD;  Location: [facility];  Service: Endoscopy;  Laterality: N/A;  100-moved to 1200 Ann to notify pt   Social History   Social History  . Marital Status: Married    Spouse Name: N/A  . Number of Children: N/A  . Years of Education: N/A   Social History Main Topics  . Smoking status: Never Smoker   . Smokeless tobacco: None  . Alcohol Use: No  . Drug Use: No  . Sexual Activity: Not Asked   Other Topics Concern  . None   Social History Narrative   Outpatient Encounter Prescriptions as of 07/10/2015  Medication Sig  . ACCU-CHEK AVIVA PLUS test strip TEST FOUR TIMES DAILY  . cloNIDine (CATAPRES) 0.2 MG tablet Take 0.2 mg by mouth 2 (two) times daily.   . cycloSPORINE (RESTASIS) 0.05 % ophthalmic emulsion Place 1 drop into both eyes daily.  Marland Kitchen EPINEPHrine (EPI-PEN) 0.3 mg/0.3 mL DEVI Inject 0.3 mg into the muscle once.  Marland Kitchen FLUoxetine (PROZAC) 20 MG capsule Take 20 mg by mouth every morning.   Marland Kitchen HUMALOG KWIKPEN 100 UNIT/ML KiwkPen INJECT  UP  TO  26 UNITS THREE TIMES DAILY BEFORE MEALS  . insulin aspart (NOVOLOG FLEXPEN) 100 UNIT/ML FlexPen Inject 26-31 Units into the skin 3 (three) times daily  with meals.  . Insulin Glargine (LANTUS SOLOSTAR) 100 UNIT/ML Solostar Pen 70 units qhs  . levothyroxine (SYNTHROID, LEVOTHROID) 125 MCG tablet Take 1 tablet (125 mcg total) by mouth daily before breakfast.  . magnesium citrate SOLN Take 1 Bottle by mouth once.  . metFORMIN (GLUCOPHAGE) 500 MG tablet TAKE 1 TABLET TWICE DAILY  . omeprazole (PRILOSEC) 20 MG capsule Take 1 capsule (20 mg total) by mouth daily. (Patient not taking: Reported on 10/10/2014)  . ondansetron (ZOFRAN) 4 mg TABS tablet Take 4 tablets by mouth every 8 (eight) hours as needed.  Marland Kitchen oxyCODONE-acetaminophen (PERCOCET/ROXICET) 5-325 MG per tablet Take 1 tablet by mouth every 6 (six) hours as needed.  . potassium chloride SA (K-DUR,KLOR-CON) 20 MEQ tablet TAKE 1 TABLET DAILY. (Patient not taking: Reported on 10/10/2014)  . PYLERA 140-125-125 MG per capsule TAKE 3 CAPSULES BY MOUTH 4 TIMES DAILY AS DIRECTED. (Patient not taking: Reported on 10/10/2014)  . simvastatin (ZOCOR) 40 MG tablet TAKE 1 TABLET EVERY DAY  . ziprasidone (GEODON) 80 MG capsule Take 80 mg by mouth at bedtime.    Marland Kitchen zolpidem (AMBIEN) 5 MG tablet Take 5 mg by mouth at bedtime as needed for sleep.   No facility-administered encounter medications on file as of 07/10/2015.   ALLERGIES: Allergies  Allergen Reactions  . Bee Venom Anaphylaxis  .  Ace Inhibitors   . Penicillins Hives   VACCINATION STATUS:  There is no immunization history on file for this patient.  Diabetes She presents for her follow-up diabetic visit. She has type 2 diabetes mellitus. Onset time: she was diagnosed at approximate age of 23 years. Her disease course has been improving. There are no hypoglycemic associated symptoms. Pertinent negatives for hypoglycemia include no confusion, headaches, pallor or seizures. Associated symptoms include fatigue and polydipsia. Pertinent negatives for diabetes include no chest pain, no polyphagia and no polyuria. There are no hypoglycemic complications. Symptoms  are improving. Diabetic complications include autonomic neuropathy. Risk factors for coronary artery disease include diabetes mellitus, dyslipidemia, obesity and sedentary lifestyle. She is following a generally unhealthy diet. She has not had a previous visit with a dietitian. She never participates in exercise. Her home blood glucose trend is decreasing steadily. Her breakfast blood glucose range is generally 140-180 mg/dl. Her lunch blood glucose range is generally 140-180 mg/dl. Her dinner blood glucose range is generally 140-180 mg/dl. Her overall blood glucose range is 140-180 mg/dl. An ACE inhibitor/angiotensin II receptor blocker is being taken.  Thyroid Problem Presents for follow-up visit. Symptoms include fatigue. Patient reports no cold intolerance, diarrhea, heat intolerance or palpitations. The symptoms have been improving. Past treatments include levothyroxine. Her past medical history is significant for hyperlipidemia.  Hyperlipidemia This is a chronic problem. The current episode started more than 1 year ago. Pertinent negatives include no chest pain, myalgias or shortness of breath. Current antihyperlipidemic treatment includes statins.  Hypertension This is a chronic problem. The current episode started more than 1 year ago. The problem is controlled. Pertinent negatives include no chest pain, headaches, palpitations or shortness of breath. Past treatments include angiotensin blockers. Hypertensive end-organ damage includes a thyroid problem.     Review of Systems  Constitutional: Positive for fatigue. Negative for unexpected weight change.  HENT: Negative for trouble swallowing and voice change.   Eyes: Negative for visual disturbance.  Respiratory: Negative for cough, shortness of breath and wheezing.   Cardiovascular: Negative for chest pain, palpitations and leg swelling.  Gastrointestinal: Negative for nausea, vomiting and diarrhea.  Endocrine: Positive for polydipsia.  Negative for cold intolerance, heat intolerance, polyphagia and polyuria.  Musculoskeletal: Negative for myalgias and arthralgias.  Skin: Negative for color change, pallor, rash and wound.  Neurological: Negative for seizures and headaches.  Psychiatric/Behavioral: Negative for suicidal ideas and confusion.    Objective:    BP 150/68 mmHg  Pulse 80  Ht 5\' 4"  (1.626 m)  Wt 232 lb (105.235 kg)  BMI 39.80 kg/m2  SpO2 96%  Wt Readings from Last 3 Encounters:  07/10/15 232 lb (105.235 kg)  04/05/15 229 lb (103.874 kg)  01/02/15 230 lb (104.327 kg)    Physical Exam  Constitutional: She is oriented to person, place, and time. She appears well-developed.  HENT:  Head: Normocephalic and atraumatic.  Eyes: EOM are normal.  Neck: Normal range of motion. Neck supple. No tracheal deviation present. No thyromegaly present.  Cardiovascular: Normal rate and regular rhythm.   Pulmonary/Chest: Effort normal and breath sounds normal.  Abdominal: Soft. Bowel sounds are normal. There is no tenderness. There is no guarding.  Musculoskeletal: Normal range of motion. She exhibits no edema.  Neurological: She is alert and oriented to person, place, and time. She has normal reflexes. No cranial nerve deficit. Coordination normal.  Skin: Skin is warm and dry. No rash noted. No erythema. No pallor.  Psychiatric: She has a normal mood  and affect. Judgment normal.    Results for orders placed or performed in visit on [id number]  Basic metabolic panel  Result Value Ref Range   Sodium 141 135 - 146 mmol/L   Potassium 3.7 3.5 - 5.3 mmol/L   Chloride 108 98 - 110 mmol/L   CO2 24 20 - 31 mmol/L   Glucose, Bld 192 (H) 65 - 99 mg/dL   BUN 13 7 - 25 mg/dL   Creat 0.69 0.60 - 0.93 mg/dL   Calcium 8.6 8.6 - 10.4 mg/dL  TSH  Result Value Ref Range   TSH 1.85 mIU/L  T4, free  Result Value Ref Range   Free T4 1.1 0.8 - 1.8 ng/dL  Hemoglobin A1c  Result Value Ref Range   Hgb A1c MFr Bld 7.7 (H) <5.7 %    Mean Plasma Glucose 174 mg/dL   Diabetic Labs (most recent): Lab Results  Component Value Date   HGBA1C 7.7* 07/02/2015   HGBA1C 8.2* 04/02/2015   HGBA1C 9.0* 12/22/2014     Assessment & Plan:   1. Uncontrolled type 2 diabetes mellitus with complication, with long-term current use of insulin ([place])  Her diabetes is  complicated by neuropathy and patient remains at a high risk for more acute and chronic complications of diabetes which include CAD, CVA, CKD, retinopathy, and neuropathy. These are all discussed in detail with the patient.  Patient came with improved glucose profile, and  recent A1c of 7.7%  improving from 9 %.  Glucose logs and insulin administration records pertaining to this visit,  to be scanned into patient's records.  Recent labs reviewed.   - I have re-counseled the patient on diet management and weight loss  by adopting a carbohydrate restricted / protein rich  Diet.  - Suggestion is made for patient to avoid simple carbohydrates   from their diet including Cakes , Desserts, Ice Cream,  Soda (  diet and regular) , Sweet Tea , Candies,  Chips, Cookies, Artificial Sweeteners,   and "Sugar-free" Products .  This will help patient to have stable blood glucose profile and potentially avoid unintended  Weight gain.  - Patient is advised to stick to a routine mealtimes to eat 3 meals  a day and avoid unnecessary snacks ( to snack only to correct hypoglycemia).  - The patient  Will be  scheduled with Jearld Fenton, RDN, CDE for individualized DM education.  - I have approached patient with the following individualized plan to manage diabetes and patient agrees.  - Continue Lantus 70 units qhs, Humalog 20 units TIDAC plus correction.  - Continue Metformin 500mg  po BID.  -Adjustment parameters are given for hypo and hyperglycemia in writing. -Patient is encouraged to call clinic for blood glucose levels less than 70 or above 300 mg /dl.  - Patient specific target  for  A1c; LDL, HDL, Triglycerides, and  Waist Circumference were discussed in detail.  2) BP/HTN: Controlled. Continue current medications including ACEI/ARB. 3) Lipids/HPL:  continue statins. 4)  Weight/Diet: CDE consult in progress, exercise, and carbohydrates information provided.  5) hypothyroidism: -Her labs are consistent with inadequate replacement with thyroid hormone. -I will continue  her levothyroxine to 125 g by mouth every morning. - We discussed about correct intake of levothyroxine, at fasting, with water, separated by at least 30 minutes from breakfast, and separated by more than 4 hours from calcium, iron, multivitamins, acid reflux medications (PPIs). -Patient is made aware of the fact that thyroid hormone replacement is needed for  life, dose to be adjusted by periodic monitoring of thyroid function tests.  6) Chronic Care/Health Maintenance:  -Patient is on ACEI/ARB and Statin medications and encouraged to continue to follow up with Ophthalmology, Podiatrist at least yearly or according to recommendations, and advised to  stay away from smoking. I have recommended yearly flu vaccine and pneumonia vaccination at least every 5 years; moderate intensity exercise for up to 150 minutes weekly; and  sleep for at least 7 hours a day.  I advised patient to maintain close follow up with her PCP for primary care needs.  Patient is asked to bring meter and  blood glucose logs during their next visit.   Follow up plan: Return in about 3 months (around 10/10/2015) for diabetes, high blood pressure, high cholesterol, underactive thyroid, meter, and logs.  Glade Lloyd, MD Phone: [phone]  Fax: [phone]   07/10/2015, 2:45 PM

## 2015-08-11 ENCOUNTER — Other Ambulatory Visit: Payer: Self-pay | Admitting: "Endocrinology

## 2015-08-13 MED ORDER — LEVOTHYROXINE SODIUM 125 MCG PO TABS: 125.0000 ug | ORAL_TABLET | Freq: Every day | ORAL | Status: DC

## 2015-09-15 ENCOUNTER — Other Ambulatory Visit: Payer: Self-pay | Admitting: "Endocrinology

## 2015-09-18 DIAGNOSIS — J45909 Unspecified asthma, uncomplicated: Secondary | ICD-10-CM | POA: Diagnosis not present

## 2015-09-18 DIAGNOSIS — R21 Rash and other nonspecific skin eruption: Secondary | ICD-10-CM | POA: Diagnosis not present

## 2015-09-18 DIAGNOSIS — J4540 Moderate persistent asthma, uncomplicated: Secondary | ICD-10-CM | POA: Diagnosis not present

## 2015-09-18 DIAGNOSIS — Z1389 Encounter for screening for other disorder: Secondary | ICD-10-CM | POA: Diagnosis not present

## 2015-09-18 DIAGNOSIS — Z6837 Body mass index (BMI) 37.0-37.9, adult: Secondary | ICD-10-CM | POA: Diagnosis not present

## 2015-10-10 ENCOUNTER — Other Ambulatory Visit: Payer: Self-pay | Admitting: "Endocrinology

## 2015-10-10 DIAGNOSIS — Z794 Long term (current) use of insulin: Secondary | ICD-10-CM | POA: Diagnosis not present

## 2015-10-10 DIAGNOSIS — E118 Type 2 diabetes mellitus with unspecified complications: Secondary | ICD-10-CM | POA: Diagnosis not present

## 2015-10-10 DIAGNOSIS — E1165 Type 2 diabetes mellitus with hyperglycemia: Secondary | ICD-10-CM | POA: Diagnosis not present

## 2015-10-11 ENCOUNTER — Ambulatory Visit: Payer: Self-pay | Admitting: "Endocrinology

## 2015-10-11 LAB — COMPLETE METABOLIC PANEL WITH GFR
ALT: 26 U/L (ref 6–29)
AST: 22 U/L (ref 10–35)
Albumin: 3.5 g/dL — ABNORMAL LOW (ref 3.6–5.1)
Alkaline Phosphatase: 107 U/L (ref 33–130)
BUN: 14 mg/dL (ref 7–25)
CO2: 26 mmol/L (ref 20–31)
Calcium: 8.8 mg/dL (ref 8.6–10.4)
Chloride: 108 mmol/L (ref 98–110)
Creat: 0.89 mg/dL (ref 0.60–0.93)
GFR, Est African American: 75 mL/min (ref >=60)
GFR, Est Non African American: 65 mL/min (ref >=60)
Glucose, Bld: 114 mg/dL — ABNORMAL HIGH (ref 65–99)
Potassium: 3.3 mmol/L — ABNORMAL LOW (ref 3.5–5.3)
Sodium: 145 mmol/L (ref 135–146)
Total Bilirubin: 0.3 mg/dL (ref 0.2–1.2)
Total Protein: 5.6 g/dL — ABNORMAL LOW (ref 6.1–8.1)

## 2015-10-11 LAB — HEMOGLOBIN A1C
Hgb A1c MFr Bld: 7 % — ABNORMAL HIGH (ref <5.7)
Mean Plasma Glucose: 154 mg/dL

## 2015-10-30 ENCOUNTER — Encounter: Payer: Self-pay | Admitting: "Endocrinology

## 2015-10-30 ENCOUNTER — Ambulatory Visit (INDEPENDENT_AMBULATORY_CARE_PROVIDER_SITE_OTHER): Payer: Commercial Managed Care - HMO | Admitting: "Endocrinology

## 2015-10-30 VITALS — BP 143/79 | HR 65 | Ht 64.0 in | Wt 227.0 lb

## 2015-10-30 DIAGNOSIS — E118 Type 2 diabetes mellitus with unspecified complications: Secondary | ICD-10-CM

## 2015-10-30 DIAGNOSIS — Z794 Long term (current) use of insulin: Secondary | ICD-10-CM

## 2015-10-30 DIAGNOSIS — E039 Hypothyroidism, unspecified: Secondary | ICD-10-CM

## 2015-10-30 DIAGNOSIS — E1165 Type 2 diabetes mellitus with hyperglycemia: Secondary | ICD-10-CM | POA: Diagnosis not present

## 2015-10-30 DIAGNOSIS — E785 Hyperlipidemia, unspecified: Secondary | ICD-10-CM | POA: Diagnosis not present

## 2015-10-30 DIAGNOSIS — I10 Essential (primary) hypertension: Secondary | ICD-10-CM

## 2015-10-30 DIAGNOSIS — IMO0002 Reserved for concepts with insufficient information to code with codable children: Secondary | ICD-10-CM

## 2015-10-30 MED ORDER — INSULIN ASPART 100 UNIT/ML FLEXPEN: 20.0000 [IU] | PEN_INJECTOR | Freq: Three times a day (TID) | SUBCUTANEOUS | 0 refills | Status: DC

## 2015-10-30 NOTE — Progress Notes (Signed)
Subjective:    Patient ID: Holly Baldwin, female    DOB: 05-19-1945,    Past Medical History:  Diagnosis Date  . Anxiety   . Chronic abdominal pain   . Diabetes mellitus   . GERD (gastroesophageal reflux disease)   . Hiatal hernia   . Hypercholesteremia   . Hypertension    Past Surgical History:  Procedure Laterality Date  . ABDOMINAL HYSTERECTOMY    . COLONOSCOPY N/A 06/23/2012   Procedure: COLONOSCOPY;  Surgeon: Rogene Houston, MD;  Location: [facility];  Service: Endoscopy;  Laterality: N/A;  100-moved to 1200 Ann to notify pt  . ESOPHAGOGASTRODUODENOSCOPY N/A 05/06/2012   Procedure: ESOPHAGOGASTRODUODENOSCOPY (EGD);  Surgeon: Rogene Houston, MD;  Location: [facility];  Service: Endoscopy;  Laterality: N/A;  . MASS EXCISION Right 05/26/2012   Procedure: EXCISION NEOPLASM RIGHT THIGH;  Surgeon: Jamesetta So, MD;  Location: [facility];  Service: General;  Laterality: Right;   Social History   Social History  . Marital status: Married    Spouse name: N/A  . Number of children: N/A  . Years of education: N/A   Social History Main Topics  . Smoking status: Never Smoker  . Smokeless tobacco: Never Used  . Alcohol use No  . Drug use: No  . Sexual activity: Not Asked   Other Topics Concern  . None   Social History Narrative  . None   Outpatient Encounter Prescriptions as of 10/30/2015  Medication Sig  . ACCU-CHEK AVIVA PLUS test strip TEST FOUR TIMES DAILY  . cloNIDine (CATAPRES) 0.2 MG tablet Take 0.2 mg by mouth 2 (two) times daily.   . cycloSPORINE (RESTASIS) 0.05 % ophthalmic emulsion Place 1 drop into both eyes daily.  Marland Kitchen EPINEPHrine (EPI-PEN) 0.3 mg/0.3 mL DEVI Inject 0.3 mg into the muscle once.  Marland Kitchen FLUoxetine (PROZAC) 20 MG capsule Take 20 mg by mouth every morning.   . insulin aspart (NOVOLOG FLEXPEN) 100 UNIT/ML FlexPen Inject 20-26 Units into the skin 3 (three) times daily with meals.  Marland Kitchen LANTUS SOLOSTAR 100 UNIT/ML Solostar Pen INJECT 70 UNITS INTO THE  SKIN DAILY AT 10 PM.  . levothyroxine (SYNTHROID, LEVOTHROID) 125 MCG tablet Take 1 tablet (125 mcg total) by mouth daily before breakfast.  . magnesium citrate SOLN Take 1 Bottle by mouth once.  . metFORMIN (GLUCOPHAGE) 500 MG tablet TAKE 1 TABLET TWICE DAILY  . omeprazole (PRILOSEC) 20 MG capsule Take 1 capsule (20 mg total) by mouth daily. (Patient not taking: Reported on 10/10/2014)  . ondansetron (ZOFRAN) 4 mg TABS tablet Take 4 tablets by mouth every 8 (eight) hours as needed.  Marland Kitchen oxyCODONE-acetaminophen (PERCOCET/ROXICET) 5-325 MG per tablet Take 1 tablet by mouth every 6 (six) hours as needed.  . potassium chloride SA (K-DUR,KLOR-CON) 20 MEQ tablet TAKE 1 TABLET DAILY. (Patient not taking: Reported on 10/10/2014)  . PYLERA 140-125-125 MG per capsule TAKE 3 CAPSULES BY MOUTH 4 TIMES DAILY AS DIRECTED. (Patient not taking: Reported on 10/10/2014)  . simvastatin (ZOCOR) 40 MG tablet TAKE 1 TABLET EVERY DAY  . ziprasidone (GEODON) 80 MG capsule Take 80 mg by mouth at bedtime.    Marland Kitchen zolpidem (AMBIEN) 5 MG tablet Take 5 mg by mouth at bedtime as needed for sleep.  . [DISCONTINUED] HUMALOG KWIKPEN 100 UNIT/ML KiwkPen INJECT  UP  TO  26 UNITS THREE TIMES DAILY BEFORE MEALS  . [DISCONTINUED] levothyroxine (SYNTHROID, LEVOTHROID) 100 MCG tablet TAKE 1 TABLET EVERY MORNING  . [DISCONTINUED] NOVOLOG  FLEXPEN 100 UNIT/ML FlexPen INJECT 26 TO 31 UNITS INTO THE SKIN 3 TIMES DAILY WITH MEALS  (Patient taking differently: INJECT 20 TO 26 UNITS INTO THE SKIN 3 TIMES DAILY WITH MEALS)   No facility-administered encounter medications on file as of 10/30/2015.    ALLERGIES: Allergies  Allergen Reactions  . Bee Venom Anaphylaxis  . Ace Inhibitors   . Penicillins Hives   VACCINATION STATUS:  There is no immunization history on file for this patient.  Diabetes  She presents for her follow-up diabetic visit. She has type 2 diabetes mellitus. Onset time: she was diagnosed at approximate age of 65 years. Her  disease course has been improving. There are no hypoglycemic associated symptoms. Pertinent negatives for hypoglycemia include no confusion, headaches, pallor or seizures. Associated symptoms include fatigue and polydipsia. Pertinent negatives for diabetes include no chest pain, no polyphagia and no polyuria. There are no hypoglycemic complications. Symptoms are improving. Diabetic complications include autonomic neuropathy. Risk factors for coronary artery disease include diabetes mellitus, dyslipidemia, obesity and sedentary lifestyle. She is following a generally unhealthy diet. She has not had a previous visit with a dietitian. She never participates in exercise. Her home blood glucose trend is decreasing steadily. Her breakfast blood glucose range is generally 140-180 mg/dl. Her lunch blood glucose range is generally 140-180 mg/dl. Her dinner blood glucose range is generally 140-180 mg/dl. Her overall blood glucose range is 140-180 mg/dl. An ACE inhibitor/angiotensin II receptor blocker is being taken.  Thyroid Problem  Presents for follow-up visit. Symptoms include fatigue. Patient reports no cold intolerance, diarrhea, heat intolerance or palpitations. The symptoms have been improving. Past treatments include levothyroxine. Her past medical history is significant for hyperlipidemia.  Hyperlipidemia  This is a chronic problem. The current episode started more than 1 year ago. Pertinent negatives include no chest pain, myalgias or shortness of breath. Current antihyperlipidemic treatment includes statins.  Hypertension  This is a chronic problem. The current episode started more than 1 year ago. The problem is controlled. Pertinent negatives include no chest pain, headaches, palpitations or shortness of breath. Past treatments include angiotensin blockers. Hypertensive end-organ damage includes a thyroid problem.     Review of Systems  Constitutional: Positive for fatigue. Negative for unexpected  weight change.  HENT: Negative for trouble swallowing and voice change.   Eyes: Negative for visual disturbance.  Respiratory: Negative for cough, shortness of breath and wheezing.   Cardiovascular: Negative for chest pain, palpitations and leg swelling.  Gastrointestinal: Negative for diarrhea, nausea and vomiting.  Endocrine: Positive for polydipsia. Negative for cold intolerance, heat intolerance, polyphagia and polyuria.  Musculoskeletal: Negative for arthralgias and myalgias.  Skin: Negative for color change, pallor, rash and wound.  Neurological: Negative for seizures and headaches.  Psychiatric/Behavioral: Negative for confusion and suicidal ideas.    Objective:    BP (!) 143/79   Pulse 65   Ht 5\' 4"  (1.626 m)   Wt 227 lb (103 kg)   BMI 38.96 kg/m   Wt Readings from Last 3 Encounters:  10/30/15 227 lb (103 kg)  07/10/15 232 lb (105.2 kg)  04/05/15 229 lb (103.9 kg)    Physical Exam  Constitutional: She is oriented to person, place, and time. She appears well-developed.  HENT:  Head: Normocephalic and atraumatic.  Eyes: EOM are normal.  Neck: Normal range of motion. Neck supple. No tracheal deviation present. No thyromegaly present.  Cardiovascular: Normal rate and regular rhythm.   Pulmonary/Chest: Effort normal and breath sounds normal.  Abdominal:  Soft. Bowel sounds are normal. There is no tenderness. There is no guarding.  Musculoskeletal: Normal range of motion. She exhibits no edema.  Neurological: She is alert and oriented to person, place, and time. She has normal reflexes. No cranial nerve deficit. Coordination normal.  Skin: Skin is warm and dry. No rash noted. No erythema. No pallor.  Psychiatric: She has a normal mood and affect. Judgment normal.    Results for orders placed or performed in visit on 10/10/15  COMPLETE METABOLIC PANEL WITH GFR  Result Value Ref Range   Sodium 145 135 - 146 mmol/L   Potassium 3.3 (L) 3.5 - 5.3 mmol/L   Chloride 108 98 -  110 mmol/L   CO2 26 20 - 31 mmol/L   Glucose, Bld 114 (H) 65 - 99 mg/dL   BUN 14 7 - 25 mg/dL   Creat 0.89 0.60 - 0.93 mg/dL   Total Bilirubin 0.3 0.2 - 1.2 mg/dL   Alkaline Phosphatase 107 33 - 130 U/L   AST 22 10 - 35 U/L   ALT 26 6 - 29 U/L   Total Protein 5.6 (L) 6.1 - 8.1 g/dL   Albumin 3.5 (L) 3.6 - 5.1 g/dL   Calcium 8.8 8.6 - 10.4 mg/dL   GFR, Est African American 75 >=60 mL/min   GFR, Est Non African American 65 >=60 mL/min  Hemoglobin A1c  Result Value Ref Range   Hgb A1c MFr Bld 7.0 (H) <5.7 %   Mean Plasma Glucose 154 mg/dL   Diabetic Labs (most recent): Lab Results  Component Value Date   HGBA1C 7.0 (H) 10/10/2015   HGBA1C 7.7 (H) 07/02/2015   HGBA1C 8.2 (H) 04/02/2015     Assessment & Plan:   1. Uncontrolled type 2 diabetes mellitus with complication, with long-term current use of insulin ([facility])  Her diabetes is  complicated by neuropathy and patient remains at a high risk for more acute and chronic complications of diabetes which include CAD, CVA, CKD, retinopathy, and neuropathy. These are all discussed in detail with the patient.  Patient came with improved glucose profile, and  recent A1c of 7%  improving from 9 %.  Glucose logs and insulin administration records pertaining to this visit,  to be scanned into patient's records.  Recent labs reviewed.   - I have re-counseled the patient on diet management and weight loss  by adopting a carbohydrate restricted / protein rich  Diet.  - Suggestion is made for patient to avoid simple carbohydrates   from their diet including Cakes , Desserts, Ice Cream,  Soda (  diet and regular) , Sweet Tea , Candies,  Chips, Cookies, Artificial Sweeteners,   and "Sugar-free" Products .  This will help patient to have stable blood glucose profile and potentially avoid unintended  Weight gain.  - Patient is advised to stick to a routine mealtimes to eat 3 meals  a day and avoid unnecessary snacks ( to snack only to correct  hypoglycemia).  - The patient  Will be  scheduled with Jearld Fenton, RDN, CDE for individualized DM education.  - I have approached patient with the following individualized plan to manage diabetes and patient agrees.  - Continue Lantus 70 units qhs, Novolog 20 units TIDAC plus correction.  - Continue Metformin 500mg  po BID.  -Adjustment parameters are given for hypo and hyperglycemia in writing. -Patient is encouraged to call clinic for blood glucose levels less than 70 or above 300 mg /dl.  - Patient specific target  for A1c; LDL, HDL, Triglycerides, and  Waist Circumference were discussed in detail.  2) BP/HTN: Controlled. Continue current medications including ACEI/ARB. 3) Lipids/HPL:  continue statins. 4)  Weight/Diet: CDE consult in progress, exercise, and carbohydrates information provided.  5) hypothyroidism: -Her labs are consistent with inadequate replacement with thyroid hormone. -I will continue  her levothyroxine to 125 g by mouth every morning. - We discussed about correct intake of levothyroxine, at fasting, with water, separated by at least 30 minutes from breakfast, and separated by more than 4 hours from calcium, iron, multivitamins, acid reflux medications (PPIs). -Patient is made aware of the fact that thyroid hormone replacement is needed for life, dose to be adjusted by periodic monitoring of thyroid function tests.  6) Chronic Care/Health Maintenance:  -Patient is on ACEI/ARB and Statin medications and encouraged to continue to follow up with Ophthalmology, Podiatrist at least yearly or according to recommendations, and advised to  stay away from smoking. I have recommended yearly flu vaccine and pneumonia vaccination at least every 5 years; moderate intensity exercise for up to 150 minutes weekly; and  sleep for at least 7 hours a day.  I advised patient to maintain close follow up with her PCP for primary care needs.  Patient is asked to bring meter and  blood  glucose logs during their next visit.   Follow up plan: Return in about 3 months (around 01/30/2016) for follow up with pre-visit labs, meter, and logs.  Glade Lloyd, MD Phone: [phone]  Fax: [phone]   10/30/2015, 11:53 AM

## 2015-10-30 NOTE — Patient Instructions (Signed)

## 2015-11-07 ENCOUNTER — Other Ambulatory Visit: Payer: Self-pay

## 2015-11-07 MED ORDER — METFORMIN HCL 500 MG PO TABS: 500.0000 mg | ORAL_TABLET | Freq: Two times a day (BID) | ORAL | 0 refills | Status: DC

## 2015-12-03 ENCOUNTER — Other Ambulatory Visit (HOSPITAL_COMMUNITY): Payer: Self-pay | Admitting: Family Medicine

## 2015-12-03 DIAGNOSIS — N63 Unspecified lump in unspecified breast: Secondary | ICD-10-CM

## 2015-12-04 ENCOUNTER — Other Ambulatory Visit (HOSPITAL_COMMUNITY): Payer: Self-pay | Admitting: Family Medicine

## 2015-12-04 DIAGNOSIS — Z09 Encounter for follow-up examination after completed treatment for conditions other than malignant neoplasm: Secondary | ICD-10-CM

## 2015-12-04 DIAGNOSIS — N63 Unspecified lump in unspecified breast: Secondary | ICD-10-CM

## 2015-12-11 ENCOUNTER — Ambulatory Visit (HOSPITAL_COMMUNITY)
Admission: RE | Admit: 2015-12-11 | Discharge: 2015-12-11 | Disposition: A | Discharge status: Home / Self Care | Payer: Commercial Managed Care - HMO | Source: Ambulatory Visit | Attending: Family Medicine | Admitting: Family Medicine

## 2015-12-11 DIAGNOSIS — Z1231 Encounter for screening mammogram for malignant neoplasm of breast: Secondary | ICD-10-CM | POA: Diagnosis not present

## 2015-12-11 DIAGNOSIS — N63 Unspecified lump in unspecified breast: Secondary | ICD-10-CM

## 2015-12-11 DIAGNOSIS — Z09 Encounter for follow-up examination after completed treatment for conditions other than malignant neoplasm: Secondary | ICD-10-CM

## 2015-12-11 DIAGNOSIS — R928 Other abnormal and inconclusive findings on diagnostic imaging of breast: Secondary | ICD-10-CM | POA: Diagnosis not present

## 2015-12-18 ENCOUNTER — Other Ambulatory Visit: Payer: Self-pay | Admitting: "Endocrinology

## 2016-01-02 ENCOUNTER — Other Ambulatory Visit: Payer: Self-pay | Admitting: "Endocrinology

## 2016-01-23 ENCOUNTER — Other Ambulatory Visit: Payer: Self-pay | Admitting: "Endocrinology

## 2016-01-23 DIAGNOSIS — Z23 Encounter for immunization: Secondary | ICD-10-CM | POA: Diagnosis not present

## 2016-01-23 DIAGNOSIS — E118 Type 2 diabetes mellitus with unspecified complications: Secondary | ICD-10-CM | POA: Diagnosis not present

## 2016-01-23 DIAGNOSIS — Z794 Long term (current) use of insulin: Secondary | ICD-10-CM | POA: Diagnosis not present

## 2016-01-23 DIAGNOSIS — E039 Hypothyroidism, unspecified: Secondary | ICD-10-CM | POA: Diagnosis not present

## 2016-01-23 DIAGNOSIS — E1165 Type 2 diabetes mellitus with hyperglycemia: Secondary | ICD-10-CM | POA: Diagnosis not present

## 2016-01-23 LAB — COMPLETE METABOLIC PANEL WITH GFR
ALT: 24 U/L (ref 6–29)
AST: 22 U/L (ref 10–35)
Albumin: 3.4 g/dL — ABNORMAL LOW (ref 3.6–5.1)
Alkaline Phosphatase: 116 U/L (ref 33–130)
BUN: 24 mg/dL (ref 7–25)
CO2: 24 mmol/L (ref 20–31)
Calcium: 9.2 mg/dL (ref 8.6–10.4)
Chloride: 109 mmol/L (ref 98–110)
Creat: 1.24 mg/dL — ABNORMAL HIGH (ref 0.60–0.93)
GFR, Est African American: 50 mL/min — ABNORMAL LOW (ref >=60)
GFR, Est Non African American: 44 mL/min — ABNORMAL LOW (ref >=60)
Glucose, Bld: 131 mg/dL — ABNORMAL HIGH (ref 65–99)
Potassium: 3.6 mmol/L (ref 3.5–5.3)
Sodium: 144 mmol/L (ref 135–146)
Total Bilirubin: 0.3 mg/dL (ref 0.2–1.2)
Total Protein: 6.1 g/dL (ref 6.1–8.1)

## 2016-01-23 LAB — T4, FREE: Free T4: 1.3 ng/dL (ref 0.8–1.8)

## 2016-01-23 LAB — HEMOGLOBIN A1C
Hgb A1c MFr Bld: 7.1 % — ABNORMAL HIGH (ref <5.7)
Mean Plasma Glucose: 157 mg/dL

## 2016-01-23 LAB — TSH: TSH: 3.12 mIU/L

## 2016-01-24 LAB — MICROALBUMIN / CREATININE URINE RATIO
Creatinine, Urine: 39 mg/dL (ref 20–320)
Microalb Creat Ratio: 274 mcg/mg creat — ABNORMAL HIGH (ref <30)
Microalb, Ur: 10.7 mg/dL

## 2016-01-31 ENCOUNTER — Encounter: Payer: Self-pay | Admitting: "Endocrinology

## 2016-01-31 ENCOUNTER — Ambulatory Visit (INDEPENDENT_AMBULATORY_CARE_PROVIDER_SITE_OTHER): Payer: Commercial Managed Care - HMO | Admitting: "Endocrinology

## 2016-01-31 VITALS — BP 168/75 | HR 88 | Ht 64.0 in | Wt 232.0 lb

## 2016-01-31 DIAGNOSIS — E1165 Type 2 diabetes mellitus with hyperglycemia: Secondary | ICD-10-CM

## 2016-01-31 DIAGNOSIS — I10 Essential (primary) hypertension: Secondary | ICD-10-CM

## 2016-01-31 DIAGNOSIS — E118 Type 2 diabetes mellitus with unspecified complications: Secondary | ICD-10-CM

## 2016-01-31 DIAGNOSIS — E039 Hypothyroidism, unspecified: Secondary | ICD-10-CM | POA: Diagnosis not present

## 2016-01-31 DIAGNOSIS — Z794 Long term (current) use of insulin: Secondary | ICD-10-CM | POA: Diagnosis not present

## 2016-01-31 DIAGNOSIS — IMO0002 Reserved for concepts with insufficient information to code with codable children: Secondary | ICD-10-CM

## 2016-01-31 DIAGNOSIS — E782 Mixed hyperlipidemia: Secondary | ICD-10-CM | POA: Diagnosis not present

## 2016-01-31 MED ORDER — LEVOTHYROXINE SODIUM 137 MCG PO TABS: 137.0000 ug | ORAL_TABLET | Freq: Every day | ORAL | 6 refills | Status: DC

## 2016-01-31 NOTE — Progress Notes (Signed)
Subjective:    Patient ID: Holly Baldwin, female    DOB: [birth year],    Past Medical History:  Diagnosis Date  . Anxiety   . Chronic abdominal pain   . Diabetes mellitus   . GERD (gastroesophageal reflux disease)   . Hiatal hernia   . Hypercholesteremia   . Hypertension    Past Surgical History:  Procedure Laterality Date  . ABDOMINAL HYSTERECTOMY    . COLONOSCOPY N/A 06/23/2012   Procedure: COLONOSCOPY;  Surgeon: Rogene Houston, MD;  Location: [facility];  Service: Endoscopy;  Laterality: N/A;  100-moved to 1200 Ann to notify pt  . ESOPHAGOGASTRODUODENOSCOPY N/A 05/06/2012   Procedure: ESOPHAGOGASTRODUODENOSCOPY (EGD);  Surgeon: Rogene Houston, MD;  Location: [facility];  Service: Endoscopy;  Laterality: N/A;  . MASS EXCISION Right 05/26/2012   Procedure: EXCISION NEOPLASM RIGHT THIGH;  Surgeon: Jamesetta So, MD;  Location: [facility];  Service: General;  Laterality: Right;   Social History   Social History  . Marital status: Married    Spouse name: N/A  . Number of children: N/A  . Years of education: N/A   Social History Main Topics  . Smoking status: Never Smoker  . Smokeless tobacco: Never Used  . Alcohol use No  . Drug use: No  . Sexual activity: Not Asked   Other Topics Concern  . None   Social History Narrative  . None   Outpatient Encounter Prescriptions as of 01/31/2016  Medication Sig  . ACCU-CHEK AVIVA PLUS test strip TEST FOUR TIMES DAILY  . Blood Glucose Monitoring Suppl (ACCU-CHEK AVIVA PLUS) w/Device KIT TEST FOUR TIMES DAILY  . cloNIDine (CATAPRES) 0.2 MG tablet Take 0.2 mg by mouth 2 (two) times daily.   . cycloSPORINE (RESTASIS) 0.05 % ophthalmic emulsion Place 1 drop into both eyes daily.  Marland Kitchen EPINEPHrine (EPI-PEN) 0.3 mg/0.3 mL DEVI Inject 0.3 mg into the muscle once.  Marland Kitchen FLUoxetine (PROZAC) 20 MG capsule Take 20 mg by mouth every morning.   . insulin aspart (NOVOLOG FLEXPEN) 100 UNIT/ML FlexPen Inject 20-26 Units into the skin 3 (three)  times daily with meals.  Marland Kitchen LANTUS SOLOSTAR 100 UNIT/ML Solostar Pen INJECT 70 UNITS INTO THE SKIN DAILY AT 10 PM.  . levothyroxine (SYNTHROID, LEVOTHROID) 137 MCG tablet Take 1 tablet (137 mcg total) by mouth daily before breakfast.  . magnesium citrate SOLN Take 1 Bottle by mouth once.  . metFORMIN (GLUCOPHAGE) 500 MG tablet TAKE 1 TABLET TWICE DAILY  . omeprazole (PRILOSEC) 20 MG capsule Take 1 capsule (20 mg total) by mouth daily. (Patient not taking: Reported on 10/10/2014)  . ondansetron (ZOFRAN) 4 mg TABS tablet Take 4 tablets by mouth every 8 (eight) hours as needed.  Marland Kitchen oxyCODONE-acetaminophen (PERCOCET/ROXICET) 5-325 MG per tablet Take 1 tablet by mouth every 6 (six) hours as needed.  . potassium chloride SA (K-DUR,KLOR-CON) 20 MEQ tablet TAKE 1 TABLET DAILY. (Patient not taking: Reported on 10/10/2014)  . PYLERA 140-125-125 MG per capsule TAKE 3 CAPSULES BY MOUTH 4 TIMES DAILY AS DIRECTED. (Patient not taking: Reported on 10/10/2014)  . simvastatin (ZOCOR) 40 MG tablet TAKE 1 TABLET EVERY DAY  . ziprasidone (GEODON) 80 MG capsule Take 80 mg by mouth at bedtime.    Marland Kitchen zolpidem (AMBIEN) 5 MG tablet Take 5 mg by mouth at bedtime as needed for sleep.  . [DISCONTINUED] levothyroxine (SYNTHROID, LEVOTHROID) 125 MCG tablet TAKE 1 TABLET EVERY DAY BEFORE BREAKFAST   No facility-administered encounter medications on file  as of 01/31/2016.    ALLERGIES: Allergies  Allergen Reactions  . Bee Venom Anaphylaxis  . Ace Inhibitors   . Penicillins Hives   VACCINATION STATUS:  There is no immunization history on file for this patient.  Diabetes  She presents for her follow-up diabetic visit. She has type 2 diabetes mellitus. Onset time: she was diagnosed at approximate age of 49 years. Her disease course has been improving. There are no hypoglycemic associated symptoms. Pertinent negatives for hypoglycemia include no confusion, headaches, pallor or seizures. Associated symptoms include fatigue and  polydipsia. Pertinent negatives for diabetes include no chest pain, no polyphagia and no polyuria. There are no hypoglycemic complications. Symptoms are improving. Diabetic complications include autonomic neuropathy. Risk factors for coronary artery disease include diabetes mellitus, dyslipidemia, obesity and sedentary lifestyle. She is following a generally unhealthy diet. She has not had a previous visit with a dietitian. She never participates in exercise. Her home blood glucose trend is decreasing steadily. Her breakfast blood glucose range is generally 140-180 mg/dl. Her lunch blood glucose range is generally 140-180 mg/dl. Her dinner blood glucose range is generally 140-180 mg/dl. Her overall blood glucose range is 140-180 mg/dl. An ACE inhibitor/angiotensin II receptor blocker is being taken.  Thyroid Problem  Presents for follow-up visit. Symptoms include fatigue. Patient reports no cold intolerance, diarrhea, heat intolerance or palpitations. The symptoms have been improving. Past treatments include levothyroxine. Her past medical history is significant for hyperlipidemia.  Hyperlipidemia  This is a chronic problem. The current episode started more than 1 year ago. Pertinent negatives include no chest pain, myalgias or shortness of breath. Current antihyperlipidemic treatment includes statins.  Hypertension  This is a chronic problem. The current episode started more than 1 year ago. The problem is controlled. Pertinent negatives include no chest pain, headaches, palpitations or shortness of breath. Past treatments include angiotensin blockers. Hypertensive end-organ damage includes a thyroid problem.     Review of Systems  Constitutional: Positive for fatigue. Negative for unexpected weight change.  HENT: Negative for trouble swallowing and voice change.   Eyes: Negative for visual disturbance.  Respiratory: Negative for cough, shortness of breath and wheezing.   Cardiovascular: Negative for  chest pain, palpitations and leg swelling.  Gastrointestinal: Negative for diarrhea, nausea and vomiting.  Endocrine: Positive for polydipsia. Negative for cold intolerance, heat intolerance, polyphagia and polyuria.  Musculoskeletal: Negative for arthralgias and myalgias.  Skin: Negative for color change, pallor, rash and wound.  Neurological: Negative for seizures and headaches.  Psychiatric/Behavioral: Negative for confusion and suicidal ideas.    Objective:    BP (!) 168/75   Pulse 88   Ht 5' 4" (1.626 m)   Wt 232 lb (105.2 kg)   BMI 39.82 kg/m   Wt Readings from Last 3 Encounters:  01/31/16 232 lb (105.2 kg)  10/30/15 227 lb (103 kg)  07/10/15 232 lb (105.2 kg)    Physical Exam  Constitutional: She is oriented to person, place, and time. She appears well-developed.  HENT:  Head: Normocephalic and atraumatic.  Eyes: EOM are normal.  Neck: Normal range of motion. Neck supple. No tracheal deviation present. No thyromegaly present.  Cardiovascular: Normal rate and regular rhythm.   Pulmonary/Chest: Effort normal and breath sounds normal.  Abdominal: Soft. Bowel sounds are normal. There is no tenderness. There is no guarding.  Musculoskeletal: Normal range of motion. She exhibits no edema.  Neurological: She is alert and oriented to person, place, and time. She has normal reflexes. No cranial nerve   deficit. Coordination normal.  Skin: Skin is warm and dry. No rash noted. No erythema. No pallor.  Psychiatric: She has a normal mood and affect. Judgment normal.    Results for orders placed or performed in visit on 01/23/16  COMPLETE METABOLIC PANEL WITH GFR  Result Value Ref Range   Sodium 144 135 - 146 mmol/L   Potassium 3.6 3.5 - 5.3 mmol/L   Chloride 109 98 - 110 mmol/L   CO2 24 20 - 31 mmol/L   Glucose, Bld 131 (H) 65 - 99 mg/dL   BUN 24 7 - 25 mg/dL   Creat 1.24 (H) 0.60 - 0.93 mg/dL   Total Bilirubin 0.3 0.2 - 1.2 mg/dL   Alkaline Phosphatase 116 33 - 130 U/L   AST  22 10 - 35 U/L   ALT 24 6 - 29 U/L   Total Protein 6.1 6.1 - 8.1 g/dL   Albumin 3.4 (L) 3.6 - 5.1 g/dL   Calcium 9.2 8.6 - 10.4 mg/dL   GFR, Est African American 50 (L) >=60 mL/min   GFR, Est Non African American 44 (L) >=60 mL/min  Microalbumin / creatinine urine ratio  Result Value Ref Range   Creatinine, Urine 39 20 - 320 mg/dL   Microalb, Ur 10.7 Not estab mg/dL   Microalb Creat Ratio 274 (H) <30 mcg/mg creat  TSH  Result Value Ref Range   TSH 3.12 mIU/L  T4, free  Result Value Ref Range   Free T4 1.3 0.8 - 1.8 ng/dL  Hemoglobin A1c  Result Value Ref Range   Hgb A1c MFr Bld 7.1 (H) <5.7 %   Mean Plasma Glucose 157 mg/dL   Diabetic Labs (most recent): Lab Results  Component Value Date   HGBA1C 7.1 (H) 01/23/2016   HGBA1C 7.0 (H) 10/10/2015   HGBA1C 7.7 (H) 07/02/2015     Assessment & Plan:   1. Uncontrolled type 2 diabetes mellitus with complication, with long-term current use of insulin ([facility])  Her diabetes is  complicated by neuropathy and patient remains at a high risk for more acute and chronic complications of diabetes which include CAD, CVA, CKD, retinopathy, and neuropathy. These are all discussed in detail with the patient.  Patient came with stable glucose profile, and  recent A1c of 7.1%  Generally improving from 9 %.  Glucose logs and insulin administration records pertaining to this visit,  to be scanned into patient's records.  Recent labs reviewed.   - I have re-counseled the patient on diet management and weight loss  by adopting a carbohydrate restricted / protein rich  Diet.  - Suggestion is made for patient to avoid simple carbohydrates   from their diet including Cakes , Desserts, Ice Cream,  Soda (  diet and regular) , Sweet Tea , Candies,  Chips, Cookies, Artificial Sweeteners,   and "Sugar-free" Products .  This will help patient to have stable blood glucose profile and potentially avoid unintended  Weight gain.  - Patient is advised to stick to a  routine mealtimes to eat 3 meals  a day and avoid unnecessary snacks ( to snack only to correct hypoglycemia).  - The patient  Will be  scheduled with Penny Crumpton, RDN, CDE for individualized DM education.  - I have approached patient with the following individualized plan to manage diabetes and patient agrees.  - Continue Lantus 70 units qhs, Novolog 20 units TIDAC plus correction. I discussed the errors she made dosing NovoLog even when her pre-meal blood glucose   is below 90 mg/dL. - Continue Metformin 500mg po BID.  -Adjustment parameters are given for hypo and hyperglycemia in writing. -Patient is encouraged to call clinic for blood glucose levels less than 70 or above 300 mg /dl.  - Patient specific target  for A1c; LDL, HDL, Triglycerides, and  Waist Circumference were discussed in detail.  2) BP/HTN: Controlled. Continue current medications including ACEI/ARB. 3) Lipids/HPL:  continue statins. 4)  Weight/Diet: CDE consult in progress, exercise, and carbohydrates information provided.  5) hypothyroidism: - She will benefit from slight increase in her levothyroxine. I would increase it to 137 g by mouth by mouth every morning.  - We discussed about correct intake of levothyroxine, at fasting, with water, separated by at least 30 minutes from breakfast, and separated by more than 4 hours from calcium, iron, multivitamins, acid reflux medications (PPIs). -Patient is made aware of the fact that thyroid hormone replacement is needed for life, dose to be adjusted by periodic monitoring of thyroid function tests.  6) Chronic Care/Health Maintenance:  -Patient is on ACEI/ARB and Statin medications and encouraged to continue to follow up with Ophthalmology, Podiatrist at least yearly or according to recommendations, and advised to  stay away from smoking. I have recommended yearly flu vaccine and pneumonia vaccination at least every 5 years; moderate intensity exercise for up to 150  minutes weekly; and  sleep for at least 7 hours a day.  I advised patient to maintain close follow up with her PCP for primary care needs.  Patient is asked to bring meter and  blood glucose logs during their next visit.   Follow up plan: Return in about 3 months (around 04/30/2016) for follow up with pre-visit labs, meter, and logs.  Gebre , MD Phone: [phone]  Fax: [phone]   01/31/2016, 10:59 AM 

## 2016-02-23 ENCOUNTER — Other Ambulatory Visit: Payer: Self-pay | Admitting: "Endocrinology

## 2016-03-24 ENCOUNTER — Other Ambulatory Visit: Payer: Self-pay | Admitting: Family Medicine

## 2016-04-28 DIAGNOSIS — Z6838 Body mass index (BMI) 38.0-38.9, adult: Secondary | ICD-10-CM | POA: Diagnosis not present

## 2016-04-28 DIAGNOSIS — Z1389 Encounter for screening for other disorder: Secondary | ICD-10-CM | POA: Diagnosis not present

## 2016-04-28 DIAGNOSIS — Z0000 Encounter for general adult medical examination without abnormal findings: Secondary | ICD-10-CM | POA: Diagnosis not present

## 2016-04-29 ENCOUNTER — Other Ambulatory Visit: Payer: Self-pay | Admitting: "Endocrinology

## 2016-04-29 DIAGNOSIS — Z794 Long term (current) use of insulin: Secondary | ICD-10-CM | POA: Diagnosis not present

## 2016-04-29 DIAGNOSIS — E1165 Type 2 diabetes mellitus with hyperglycemia: Secondary | ICD-10-CM | POA: Diagnosis not present

## 2016-04-29 DIAGNOSIS — E118 Type 2 diabetes mellitus with unspecified complications: Secondary | ICD-10-CM | POA: Diagnosis not present

## 2016-04-29 LAB — COMPREHENSIVE METABOLIC PANEL
ALT: 20 U/L (ref 6–29)
AST: 18 U/L (ref 10–35)
Albumin: 3.3 g/dL — ABNORMAL LOW (ref 3.6–5.1)
Alkaline Phosphatase: 102 U/L (ref 33–130)
BUN: 10 mg/dL (ref 7–25)
CO2: 24 mmol/L (ref 20–31)
Calcium: 8.7 mg/dL (ref 8.6–10.4)
Chloride: 111 mmol/L — ABNORMAL HIGH (ref 98–110)
Creat: 0.98 mg/dL — ABNORMAL HIGH (ref 0.60–0.93)
Glucose, Bld: 182 mg/dL — ABNORMAL HIGH (ref 65–99)
Potassium: 3.8 mmol/L (ref 3.5–5.3)
Sodium: 144 mmol/L (ref 135–146)
Total Bilirubin: 0.3 mg/dL (ref 0.2–1.2)
Total Protein: 5.7 g/dL — ABNORMAL LOW (ref 6.1–8.1)

## 2016-04-30 ENCOUNTER — Other Ambulatory Visit: Payer: Self-pay | Admitting: "Endocrinology

## 2016-04-30 LAB — HEMOGLOBIN A1C
Hgb A1c MFr Bld: 6.6 % — ABNORMAL HIGH (ref <5.7)
Mean Plasma Glucose: 143 mg/dL

## 2016-05-05 ENCOUNTER — Ambulatory Visit (INDEPENDENT_AMBULATORY_CARE_PROVIDER_SITE_OTHER): Payer: Commercial Managed Care - HMO | Admitting: "Endocrinology

## 2016-05-05 ENCOUNTER — Encounter: Payer: Self-pay | Admitting: "Endocrinology

## 2016-05-05 DIAGNOSIS — Z794 Long term (current) use of insulin: Secondary | ICD-10-CM

## 2016-05-05 DIAGNOSIS — E1165 Type 2 diabetes mellitus with hyperglycemia: Secondary | ICD-10-CM

## 2016-05-05 DIAGNOSIS — I10 Essential (primary) hypertension: Secondary | ICD-10-CM

## 2016-05-05 DIAGNOSIS — E039 Hypothyroidism, unspecified: Secondary | ICD-10-CM

## 2016-05-05 DIAGNOSIS — IMO0002 Reserved for concepts with insufficient information to code with codable children: Secondary | ICD-10-CM

## 2016-05-05 DIAGNOSIS — E782 Mixed hyperlipidemia: Secondary | ICD-10-CM

## 2016-05-05 DIAGNOSIS — E118 Type 2 diabetes mellitus with unspecified complications: Secondary | ICD-10-CM | POA: Diagnosis not present

## 2016-05-05 MED ORDER — INSULIN ASPART 100 UNIT/ML FLEXPEN: PEN_INJECTOR | SUBCUTANEOUS | 2 refills | Status: DC

## 2016-05-05 NOTE — Progress Notes (Signed)
Subjective:    Patient ID: Holly Baldwin, female    DOB: 04-28-1945,    Past Medical History:  Diagnosis Date  . Anxiety   . Chronic abdominal pain   . Diabetes mellitus   . GERD (gastroesophageal reflux disease)   . Hiatal hernia   . Hypercholesteremia   . Hypertension    Past Surgical History:  Procedure Laterality Date  . ABDOMINAL HYSTERECTOMY    . COLONOSCOPY N/A 06/23/2012   Procedure: COLONOSCOPY;  Surgeon: Rogene Houston, MD;  Location: [facility];  Service: Endoscopy;  Laterality: N/A;  100-moved to 1200 Ann to notify pt  . ESOPHAGOGASTRODUODENOSCOPY N/A 05/06/2012   Procedure: ESOPHAGOGASTRODUODENOSCOPY (EGD);  Surgeon: Rogene Houston, MD;  Location: [facility];  Service: Endoscopy;  Laterality: N/A;  . MASS EXCISION Right 05/26/2012   Procedure: EXCISION NEOPLASM RIGHT THIGH;  Surgeon: Jamesetta So, MD;  Location: [facility];  Service: General;  Laterality: Right;   Social History   Social History  . Marital status: Married    Spouse name: N/A  . Number of children: N/A  . Years of education: N/A   Social History Main Topics  . Smoking status: Never Smoker  . Smokeless tobacco: Never Used  . Alcohol use No  . Drug use: No  . Sexual activity: Not on file   Other Topics Concern  . Not on file   Social History Narrative  . No narrative on file   Outpatient Encounter Prescriptions as of 05/05/2016  Medication Sig  . albuterol (PROVENTIL HFA;VENTOLIN HFA) 108 (90 Base) MCG/ACT inhaler Inhale into the lungs every 6 (six) hours as needed for wheezing or shortness of breath.  . budesonide-formoterol (SYMBICORT) 160-4.5 MCG/ACT inhaler Inhale 2 puffs into the lungs 2 (two) times daily.  Marland Kitchen ACCU-CHEK AVIVA PLUS test strip TEST FOUR TIMES DAILY  . B-D ULTRAFINE III SHORT PEN 31G X 8 MM MISC USE AT BEDTIME  . Blood Glucose Monitoring Suppl (ACCU-CHEK AVIVA PLUS) w/Device KIT TEST FOUR TIMES DAILY  . cloNIDine (CATAPRES) 0.2 MG tablet Take 0.2 mg by mouth 2 (two)  times daily.   . cycloSPORINE (RESTASIS) 0.05 % ophthalmic emulsion Place 1 drop into both eyes daily.  Marland Kitchen EPINEPHrine (EPI-PEN) 0.3 mg/0.3 mL DEVI Inject 0.3 mg into the muscle once.  Marland Kitchen FLUoxetine (PROZAC) 20 MG capsule Take 20 mg by mouth every morning.   . insulin aspart (NOVOLOG FLEXPEN) 100 UNIT/ML FlexPen INJECT 15-21 UNITS INTO THE SKIN THREE TIMES DAILY WITH MEALS.  Marland Kitchen LANTUS SOLOSTAR 100 UNIT/ML Solostar Pen INJECT  70 UNITS SUBCUTANEOUSLY  AT  10PM  . levothyroxine (SYNTHROID, LEVOTHROID) 137 MCG tablet TAKE 1 TABLET EVERY DAY BEFORE BREAKFAST  . magnesium citrate SOLN Take 1 Bottle by mouth once.  . metFORMIN (GLUCOPHAGE) 500 MG tablet TAKE 1 TABLET TWICE DAILY  . ondansetron (ZOFRAN) 4 mg TABS tablet Take 4 tablets by mouth every 8 (eight) hours as needed.  Marland Kitchen oxyCODONE-acetaminophen (PERCOCET/ROXICET) 5-325 MG per tablet Take 1 tablet by mouth every 6 (six) hours as needed.  . simvastatin (ZOCOR) 40 MG tablet TAKE 1 TABLET EVERY DAY  . ziprasidone (GEODON) 80 MG capsule Take 80 mg by mouth at bedtime.    Marland Kitchen zolpidem (AMBIEN) 5 MG tablet Take 5 mg by mouth at bedtime as needed for sleep.  . [DISCONTINUED] NOVOLOG FLEXPEN 100 UNIT/ML FlexPen INJECT 20 TO 26 UNITS INTO THE SKIN THREE TIMES DAILY WITH MEALS.   No facility-administered encounter medications on file  as of 05/05/2016.    ALLERGIES: Allergies  Allergen Reactions  . Bee Venom Anaphylaxis  . Ace Inhibitors   . Penicillins Hives   VACCINATION STATUS:  There is no immunization history on file for this patient.  Diabetes  She presents for her follow-up diabetic visit. She has type 2 diabetes mellitus. Onset time: she was diagnosed at approximate age of 23 years. Her disease course has been improving. There are no hypoglycemic associated symptoms. Pertinent negatives for hypoglycemia include no confusion, headaches, pallor or seizures. Associated symptoms include fatigue and polydipsia. Pertinent negatives for diabetes include  no chest pain, no polyphagia and no polyuria. There are no hypoglycemic complications. Symptoms are improving. Diabetic complications include autonomic neuropathy. Risk factors for coronary artery disease include diabetes mellitus, dyslipidemia, obesity and sedentary lifestyle. She is following a generally unhealthy diet. She has not had a previous visit with a dietitian. She never participates in exercise. Her home blood glucose trend is decreasing steadily. Her breakfast blood glucose range is generally 140-180 mg/dl. Her lunch blood glucose range is generally 140-180 mg/dl. Her dinner blood glucose range is generally 140-180 mg/dl. Her overall blood glucose range is 140-180 mg/dl. An ACE inhibitor/angiotensin II receptor blocker is being taken.  Thyroid Problem  Presents for follow-up visit. Symptoms include fatigue. Patient reports no cold intolerance, diarrhea, heat intolerance or palpitations. The symptoms have been improving. Past treatments include levothyroxine. Her past medical history is significant for hyperlipidemia.  Hyperlipidemia  This is a chronic problem. The current episode started more than 1 year ago. Pertinent negatives include no chest pain, myalgias or shortness of breath. Current antihyperlipidemic treatment includes statins.  Hypertension  This is a chronic problem. The current episode started more than 1 year ago. The problem is controlled. Pertinent negatives include no chest pain, headaches, palpitations or shortness of breath. Past treatments include angiotensin blockers. Identifiable causes of hypertension include a thyroid problem.     Review of Systems  Constitutional: Positive for fatigue. Negative for unexpected weight change.  HENT: Negative for trouble swallowing and voice change.   Eyes: Negative for visual disturbance.  Respiratory: Negative for cough, shortness of breath and wheezing.   Cardiovascular: Negative for chest pain, palpitations and leg swelling.   Gastrointestinal: Negative for diarrhea, nausea and vomiting.  Endocrine: Positive for polydipsia. Negative for cold intolerance, heat intolerance, polyphagia and polyuria.  Musculoskeletal: Negative for arthralgias and myalgias.  Skin: Negative for color change, pallor, rash and wound.  Neurological: Negative for seizures and headaches.  Psychiatric/Behavioral: Negative for confusion and suicidal ideas.    Objective:    There were no vitals taken for this visit.  Wt Readings from Last 3 Encounters:  01/31/16 232 lb (105.2 kg)  10/30/15 227 lb (103 kg)  07/10/15 232 lb (105.2 kg)    Physical Exam  Constitutional: She is oriented to person, place, and time. She appears well-developed.  HENT:  Head: Normocephalic and atraumatic.  Eyes: EOM are normal.  Neck: Normal range of motion. Neck supple. No tracheal deviation present. No thyromegaly present.  Cardiovascular: Normal rate and regular rhythm.   Pulmonary/Chest: Effort normal and breath sounds normal.  Abdominal: Soft. Bowel sounds are normal. There is no tenderness. There is no guarding.  Musculoskeletal: Normal range of motion. She exhibits no edema.  Neurological: She is alert and oriented to person, place, and time. She has normal reflexes. No cranial nerve deficit. Coordination normal.  Skin: Skin is warm and dry. No rash noted. No erythema. No pallor.  Psychiatric: She has a normal mood and affect. Judgment normal.    Results for orders placed or performed in visit on 04/29/16  Comprehensive metabolic panel  Result Value Ref Range   Sodium 144 135 - 146 mmol/L   Potassium 3.8 3.5 - 5.3 mmol/L   Chloride 111 (H) 98 - 110 mmol/L   CO2 24 20 - 31 mmol/L   Glucose, Bld 182 (H) 65 - 99 mg/dL   BUN 10 7 - 25 mg/dL   Creat 0.98 (H) 0.60 - 0.93 mg/dL   Total Bilirubin 0.3 0.2 - 1.2 mg/dL   Alkaline Phosphatase 102 33 - 130 U/L   AST 18 10 - 35 U/L   ALT 20 6 - 29 U/L   Total Protein 5.7 (L) 6.1 - 8.1 g/dL   Albumin 3.3  (L) 3.6 - 5.1 g/dL   Calcium 8.7 8.6 - 10.4 mg/dL  Hemoglobin A1c  Result Value Ref Range   Hgb A1c MFr Bld 6.6 (H) <5.7 %   Mean Plasma Glucose 143 mg/dL   Diabetic Labs (most recent): Lab Results  Component Value Date   HGBA1C 6.6 (H) 04/29/2016   HGBA1C 7.1 (H) 01/23/2016   HGBA1C 7.0 (H) 10/10/2015     Assessment & Plan:   1. Uncontrolled type 2 diabetes mellitus with complication, with long-term current use of insulin ([place])  Her diabetes is  complicated by neuropathy and patient remains at a high risk for more acute and chronic complications of diabetes which include CAD, CVA, CKD, retinopathy, and neuropathy. These are all discussed in detail with the patient.  Patient came with stable glucose profile, and  recent A1c of 6.6%  Generally improving from 9 %.  Glucose logs and insulin administration records pertaining to this visit,  to be scanned into patient's records.  Recent labs reviewed.   - I have re-counseled the patient on diet management and weight loss  by adopting a carbohydrate restricted / protein rich  Diet.  - Suggestion is made for patient to avoid simple carbohydrates   from their diet including Cakes , Desserts, Ice Cream,  Soda (  diet and regular) , Sweet Tea , Candies,  Chips, Cookies, Artificial Sweeteners,   and "Sugar-free" Products .  This will help patient to have stable blood glucose profile and potentially avoid unintended  Weight gain.  - Patient is advised to stick to a routine mealtimes to eat 3 meals  a day and avoid unnecessary snacks ( to snack only to correct hypoglycemia).  - The patient  Will be  scheduled with Holly Baldwin, RDN, CDE for individualized DM education.  - I have approached patient with the following individualized plan to manage diabetes and patient agrees.  - Continue Lantus 70 units qhs, lower Novolog to 15 units TIDAC plus correction. I discussed the errors she made dosing NovoLog even when her pre-meal blood glucose is  below 90 mg/dL. - Continue Metformin 523m po BID.  -Adjustment parameters are given for hypo and hyperglycemia in writing. -Patient is encouraged to call clinic for blood glucose levels less than 70 or above 300 mg /dl.  - Patient specific target  for A1c; LDL, HDL, Triglycerides, and  Waist Circumference were discussed in detail.  2) BP/HTN: Controlled. Continue current medications including ACEI/ARB. 3) Lipids/HPL:  continue statins. 4)  Weight/Diet: CDE consult in progress, exercise, and carbohydrates information provided.  5) hypothyroidism: - She will benefit from slight increase in her levothyroxine. I would increase it to 137 g  by mouth by mouth every morning.  - We discussed about correct intake of levothyroxine, at fasting, with water, separated by at least 30 minutes from breakfast, and separated by more than 4 hours from calcium, iron, multivitamins, acid reflux medications (PPIs). -Patient is made aware of the fact that thyroid hormone replacement is needed for life, dose to be adjusted by periodic monitoring of thyroid function tests.  6) Chronic Care/Health Maintenance:  -Patient is on ACEI/ARB and Statin medications and encouraged to continue to follow up with Ophthalmology, Podiatrist at least yearly or according to recommendations, and advised to  stay away from smoking. I have recommended yearly flu vaccine and pneumonia vaccination at least every 5 years; moderate intensity exercise for up to 150 minutes weekly; and  sleep for at least 7 hours a day.  I advised patient to maintain close follow up with her PCP for primary care needs.  Patient is asked to bring meter and  blood glucose logs during their next visit.   Follow up plan: Return in about 3 months (around 08/05/2016) for meter, and logs.  Glade Lloyd, MD Phone: [phone]  Fax: [phone]   05/05/2016, 11:50 AM

## 2016-05-05 NOTE — Patient Instructions (Signed)

## 2016-06-27 ENCOUNTER — Other Ambulatory Visit: Payer: Self-pay | Admitting: "Endocrinology

## 2016-06-30 DIAGNOSIS — H5200 Hypermetropia, unspecified eye: Secondary | ICD-10-CM | POA: Diagnosis not present

## 2016-06-30 DIAGNOSIS — H25811 Combined forms of age-related cataract, right eye: Secondary | ICD-10-CM | POA: Diagnosis not present

## 2016-06-30 DIAGNOSIS — E119 Type 2 diabetes mellitus without complications: Secondary | ICD-10-CM | POA: Diagnosis not present

## 2016-06-30 MED ORDER — LEVOTHYROXINE SODIUM 137 MCG PO TABS: ORAL_TABLET | ORAL | 1 refills | Status: DC

## 2016-07-05 ENCOUNTER — Other Ambulatory Visit: Payer: Self-pay | Admitting: "Endocrinology

## 2016-07-15 ENCOUNTER — Emergency Department (HOSPITAL_COMMUNITY)
Admission: EM | Admit: 2016-07-15 | Discharge: 2016-07-16 | Disposition: A | Discharge status: Home / Self Care | Payer: Commercial Managed Care - HMO | Attending: Emergency Medicine | Admitting: Emergency Medicine

## 2016-07-15 ENCOUNTER — Other Ambulatory Visit: Payer: Self-pay

## 2016-07-15 ENCOUNTER — Encounter (HOSPITAL_COMMUNITY): Payer: Self-pay | Admitting: Emergency Medicine

## 2016-07-15 ENCOUNTER — Emergency Department (HOSPITAL_COMMUNITY): Payer: Commercial Managed Care - HMO

## 2016-07-15 DIAGNOSIS — R404 Transient alteration of awareness: Secondary | ICD-10-CM | POA: Diagnosis not present

## 2016-07-15 DIAGNOSIS — E119 Type 2 diabetes mellitus without complications: Secondary | ICD-10-CM | POA: Diagnosis not present

## 2016-07-15 DIAGNOSIS — I10 Essential (primary) hypertension: Secondary | ICD-10-CM | POA: Insufficient documentation

## 2016-07-15 DIAGNOSIS — Z7984 Long term (current) use of oral hypoglycemic drugs: Secondary | ICD-10-CM | POA: Insufficient documentation

## 2016-07-15 DIAGNOSIS — I517 Cardiomegaly: Secondary | ICD-10-CM | POA: Diagnosis not present

## 2016-07-15 DIAGNOSIS — I6782 Cerebral ischemia: Secondary | ICD-10-CM | POA: Diagnosis not present

## 2016-07-15 DIAGNOSIS — Z5181 Encounter for therapeutic drug level monitoring: Secondary | ICD-10-CM | POA: Insufficient documentation

## 2016-07-15 DIAGNOSIS — R05 Cough: Secondary | ICD-10-CM | POA: Insufficient documentation

## 2016-07-15 DIAGNOSIS — R4182 Altered mental status, unspecified: Secondary | ICD-10-CM | POA: Diagnosis not present

## 2016-07-15 DIAGNOSIS — R509 Fever, unspecified: Secondary | ICD-10-CM | POA: Insufficient documentation

## 2016-07-15 LAB — PROTIME-INR
INR: 0.98
Prothrombin Time: 13 seconds (ref 11.4–15.2)

## 2016-07-15 LAB — URINALYSIS, ROUTINE W REFLEX MICROSCOPIC
Bacteria, UA: NONE SEEN
Bilirubin Urine: NEGATIVE
Glucose, UA: 150 mg/dL — AB
Hgb urine dipstick: NEGATIVE
Ketones, ur: 5 mg/dL — AB
Leukocytes, UA: NEGATIVE
Nitrite: NEGATIVE
Protein, ur: >=300 mg/dL — AB
Specific Gravity, Urine: 1.008 (ref 1.005–1.030)
pH: 7 (ref 5.0–8.0)

## 2016-07-15 LAB — CBC WITH DIFFERENTIAL/PLATELET
Basophils Absolute: 0 10*3/uL (ref 0.0–0.1)
Basophils Relative: 0 %
Eosinophils Absolute: 0 10*3/uL (ref 0.0–0.7)
Eosinophils Relative: 0 %
HCT: 39.5 % (ref 36.0–46.0)
Hemoglobin: 13.1 g/dL (ref 12.0–15.0)
Lymphocytes Relative: 12 %
Lymphs Abs: 1 10*3/uL (ref 0.7–4.0)
MCH: 26.9 pg (ref 26.0–34.0)
MCHC: 33.2 g/dL (ref 30.0–36.0)
MCV: 81.1 fL (ref 78.0–100.0)
Monocytes Absolute: 0.2 10*3/uL (ref 0.1–1.0)
Monocytes Relative: 3 %
Neutro Abs: 6.9 10*3/uL (ref 1.7–7.7)
Neutrophils Relative %: 85 %
Platelets: 272 10*3/uL (ref 150–400)
RBC: 4.87 MIL/uL (ref 3.87–5.11)
RDW: 15.6 % — ABNORMAL HIGH (ref 11.5–15.5)
WBC: 8.2 10*3/uL (ref 4.0–10.5)

## 2016-07-15 LAB — COMPREHENSIVE METABOLIC PANEL
ALT: 24 U/L (ref 14–54)
AST: 27 U/L (ref 15–41)
Albumin: 3.5 g/dL (ref 3.5–5.0)
Alkaline Phosphatase: 97 U/L (ref 38–126)
Anion gap: 10 (ref 5–15)
BUN: 17 mg/dL (ref 6–20)
CO2: 23 mmol/L (ref 22–32)
Calcium: 9 mg/dL (ref 8.9–10.3)
Chloride: 106 mmol/L (ref 101–111)
Creatinine, Ser: 1.31 mg/dL — ABNORMAL HIGH (ref 0.44–1.00)
GFR calc Af Amer: 46 mL/min — ABNORMAL LOW (ref >60)
GFR calc non Af Amer: 40 mL/min — ABNORMAL LOW (ref >60)
Glucose, Bld: 193 mg/dL — ABNORMAL HIGH (ref 65–99)
Potassium: 3 mmol/L — ABNORMAL LOW (ref 3.5–5.1)
Sodium: 139 mmol/L (ref 135–145)
Total Bilirubin: 0.7 mg/dL (ref 0.3–1.2)
Total Protein: 6.8 g/dL (ref 6.5–8.1)

## 2016-07-15 LAB — CK: Total CK: 438 U/L — ABNORMAL HIGH (ref 38–234)

## 2016-07-15 LAB — CBG MONITORING, ED: Glucose-Capillary: 189 mg/dL — ABNORMAL HIGH (ref 65–99)

## 2016-07-15 LAB — I-STAT CG4 LACTIC ACID, ED: Lactic Acid, Venous: 1.6 mmol/L (ref 0.5–1.9)

## 2016-07-15 MED ORDER — SODIUM CHLORIDE 0.9 % IV BOLUS (SEPSIS)
1000.0000 mL | Freq: Once | INTRAVENOUS | Status: AC
  Administered 2016-07-15: 1000 mL via INTRAVENOUS

## 2016-07-15 MED ORDER — ACETAMINOPHEN 500 MG PO TABS
1000.0000 mg | ORAL_TABLET | Freq: Once | ORAL | Status: AC
  Administered 2016-07-15: 1000 mg via ORAL
  Filled 2016-07-15: qty 2

## 2016-07-15 MED ORDER — SODIUM CHLORIDE 0.9 % IV BOLUS (SEPSIS): 1000.0000 mL | Freq: Once | INTRAVENOUS | Status: DC

## 2016-07-15 MED ORDER — SODIUM CHLORIDE 0.9 % IV BOLUS (SEPSIS): 500.0000 mL | Freq: Once | INTRAVENOUS | Status: DC

## 2016-07-15 MED ORDER — VANCOMYCIN HCL IN DEXTROSE 1-5 GM/200ML-% IV SOLN
1000.0000 mg | Freq: Once | INTRAVENOUS | Status: AC
  Administered 2016-07-16: 1000 mg via INTRAVENOUS
  Filled 2016-07-15: qty 200

## 2016-07-15 MED ORDER — IBUPROFEN 400 MG PO TABS
400.0000 mg | ORAL_TABLET | Freq: Once | ORAL | Status: AC
  Administered 2016-07-15: 400 mg via ORAL
  Filled 2016-07-15: qty 1

## 2016-07-15 NOTE — ED Notes (Signed)
MD Vanita Panda notified about pt.

## 2016-07-15 NOTE — ED Triage Notes (Signed)
Pt comes in with AMS, states episode happened around 1400 today. Pt is able to states name, DOB, and year. AC in house does not work properly and was around 46 in the house today.

## 2016-07-15 NOTE — ED Provider Notes (Signed)
[facility] DEPT Provider Note   CSN: [id number] Arrival date & time: 07/15/16  2149   By signing my name below, I, Marcello Moores, attest that this documentation has been prepared under the direction and in the presence of Ripley Fraise, MD. Electronically Signed: Marcello Moores, ED Scribe. 07/15/16. 11:59 PM.   History   Chief Complaint Chief Complaint  Patient presents with  . Altered Mental Status    The history is provided by the patient and the spouse. No language interpreter was used.  Altered Mental Status   This is a new problem. The current episode started 3 to 5 hours ago. The problem has been gradually improving. Associated symptoms include confusion. Pertinent negatives include no seizures, no weakness and no agitation. Her past medical history is significant for hypertension.    HPI Comments: Holly Baldwin is a 71 y.o. female who presents to the Emergency Department complaining of a sudden onset of confusion with associated fever that began tonight. Per husband, she was preparing for dinner when her symptoms began. She was checking her sugar, when her husband heard the pan in her hand rattle. Per husband, she denied that she was confused. Also it is unsure whether she had taken all her prescribed medications, as her husband was at work. Husband reports a high temperature in their house, but he adds that he feels "fine". Pt denies travel. Pt also reports a chronic cough for the past 6-7 months. Pt denies vomiting, diarrhea, headache, back pain, neck pain, chest pain, and abdominal pain.   Past Medical History:  Diagnosis Date  . Anxiety   . Chronic abdominal pain   . Diabetes mellitus   . GERD (gastroesophageal reflux disease)   . Hiatal hernia   . Hypercholesteremia   . Hypertension     Patient Active Problem List   Diagnosis Date Noted  . Morbid obesity due to excess calories ([place]) 07/10/2015  . Essential hypertension, benign 01/02/2015  . Hyperlipidemia  01/02/2015  . Unspecified gastritis and gastroduodenitis without mention of hemorrhage 05/06/2012  . Abdominal pain 05/05/2012  . Nausea & vomiting 05/05/2012  . Depression 05/05/2012  . GERD (gastroesophageal reflux disease) 05/05/2012  . DM (diabetes mellitus), type 2, uncontrolled ([place]) 05/05/2012  . Primary hypothyroidism 05/05/2012  . Hiatal hernia 05/05/2012  . SHOULDER PAIN 03/25/2007  . IMPINGEMENT SYNDROME 03/25/2007  . RUPTURE ROTATOR CUFF 03/25/2007  . HIGH BLOOD PRESSURE 03/25/2007    Past Surgical History:  Procedure Laterality Date  . ABDOMINAL HYSTERECTOMY    . COLONOSCOPY N/A 06/23/2012   Procedure: COLONOSCOPY;  Surgeon: Rogene Houston, MD;  [facility];  Service: Endoscopy;  Laterality: N/A;  100-moved to 1200 Ann to notify pt  . ESOPHAGOGASTRODUODENOSCOPY N/A 05/06/2012   Procedure: ESOPHAGOGASTRODUODENOSCOPY (EGD);  Surgeon: Rogene Houston, MD;  [facility];  Service: Endoscopy;  Laterality: N/A;  . MASS EXCISION Right 05/26/2012   Procedure: EXCISION NEOPLASM RIGHT THIGH;  Surgeon: Jamesetta So, MD;  Location: [facility];  Service: General;  Laterality: Right;    OB History    No data available       Home Medications    Prior to Admission medications   Medication Sig Start Date End Date Taking? Authorizing Provider  ACCU-CHEK AVIVA PLUS test strip TEST BLOOD SUGAR FOUR TIMES DAILY 07/07/16   Cassandria Anger, MD  albuterol (PROVENTIL HFA;VENTOLIN HFA) 108 (90 Base) MCG/ACT inhaler Inhale into the lungs every 6 (six) hours as needed for wheezing or shortness  of breath.    [provider]  B-D ULTRAFINE III SHORT PEN 31G X 8 MM MISC USE AT BEDTIME 04/30/16   Cassandria Anger, MD  Blood Glucose Monitoring Suppl (ACCU-CHEK AVIVA PLUS) w/Device KIT TEST FOUR TIMES DAILY 12/18/15   Nida, Marella Chimes, MD  budesonide-formoterol (SYMBICORT) 160-4.5 MCG/ACT inhaler Inhale 2 puffs into the lungs 2 (two) times daily.     [provider]  cloNIDine (CATAPRES) 0.2 MG tablet Take 0.2 mg by mouth 2 (two) times daily.     [provider]  cycloSPORINE (RESTASIS) 0.05 % ophthalmic emulsion Place 1 drop into both eyes daily.    [provider]  EPINEPHrine (EPI-PEN) 0.3 mg/0.3 mL DEVI Inject 0.3 mg into the muscle once.    [provider]  FLUoxetine (PROZAC) 20 MG capsule Take 20 mg by mouth every morning.     [provider]  insulin aspart (NOVOLOG FLEXPEN) 100 UNIT/ML FlexPen INJECT 15-21 UNITS INTO THE SKIN THREE TIMES DAILY WITH MEALS. 05/05/16   Cassandria Anger, MD  LANTUS SOLOSTAR 100 UNIT/ML Solostar Pen INJECT  70 UNITS SUBCUTANEOUSLY  AT  10PM 07/07/16   Cassandria Anger, MD  levothyroxine (SYNTHROID, LEVOTHROID) 137 MCG tablet TAKE 1 TABLET EVERY DAY BEFORE BREAKFAST 06/30/16   Cassandria Anger, MD  magnesium citrate SOLN Take 1 Bottle by mouth once.    [provider]  metFORMIN (GLUCOPHAGE) 500 MG tablet TAKE 1 TABLET TWICE DAILY 03/24/16   Fayrene Helper, MD  ondansetron [facility]) 4 mg TABS tablet Take 4 tablets by mouth every 8 (eight) hours as needed. 10/10/14   Nat Christen, MD  oxyCODONE-acetaminophen (PERCOCET/ROXICET) 5-325 MG per tablet Take 1 tablet by mouth every 6 (six) hours as needed. 10/10/14   Nat Christen, MD  simvastatin (ZOCOR) 40 MG tablet TAKE 1 TABLET EVERY DAY 03/24/16   Fayrene Helper, MD  ziprasidone (GEODON) 80 MG capsule Take 80 mg by mouth at bedtime.      [provider]  zolpidem (AMBIEN) 5 MG tablet Take 5 mg by mouth at bedtime as needed for sleep.    [provider]    Family History Family History  Problem Relation Age of Onset  . Diabetes Mother   . Diabetes Father   . Diabetes Sister   . Diabetes Brother   . Colon cancer Neg Hx   . Colon polyps Neg Hx     Social History Social History  Substance Use Topics  . Smoking status: Never Smoker  . Smokeless tobacco: Never Used  .  Alcohol use No     Allergies   Bee venom; Ace inhibitors; and Penicillins   Review of Systems Review of Systems  Respiratory: Positive for cough.   Cardiovascular: Negative for chest pain.  Gastrointestinal: Negative for abdominal pain, diarrhea and vomiting.  Musculoskeletal: Negative for back pain and neck pain.  Neurological: Negative for seizures, weakness and headaches.  Psychiatric/Behavioral: Positive for confusion. Negative for agitation.  All other systems reviewed and are negative.    Physical Exam Updated Vital Signs BP (!) 187/75 (BP Location: Left Arm)   Pulse (!) 101   Temp (!) 103.1 F (39.5 C) (Oral)   Resp (!) 33   Ht '5\' 3"'  (1.6 m)   Wt 235 lb (106.6 kg)   SpO2 94%   BMI 41.63 kg/m   Physical Exam CONSTITUTIONAL: Well developed/well nourished HEAD: Normocephalic/atraumatic EYES: EOMI/PERRL ENMT: Mucous membranes moist NECK: supple no meningeal signs SPINE/BACK:entire spine  nontender CV: S1/S2 noted, no murmurs/rubs/gallops noted LUNGS: Lungs are clear to auscultation bilaterally, no apparent distress ABDOMEN: soft, nontender, no rebound or guarding, bowel sounds noted throughout abdomen GU:no cva tenderness NEURO: Pt is awake/alert moves all extremitiesx4.  No facial droop. Pt able to recall birthdate and recognize family.  EXTREMITIES: pulses normal/equal, full ROM SKIN: warm, color normal PSYCH: no abnormalities of mood noted, alert and oriented to situation   ED Treatments / Results   DIAGNOSTIC STUDIES: Oxygen Saturation is 94% on RA, low by my interpretation.   COORDINATION OF CARE: 11:42 PM-Discussed next steps with pt including CT scan Pt verbalized understanding and is agreeable with the plan.   Labs (all labs ordered are listed, but only abnormal results are displayed) Labs Reviewed  COMPREHENSIVE METABOLIC PANEL - Abnormal; Notable for the following:       Result Value   Potassium 3.0 (*)    Glucose, Bld 193 (*)     Creatinine, Ser 1.31 (*)    GFR calc non Af Amer 40 (*)    GFR calc Af Amer 46 (*)    All other components within normal limits  CBC WITH DIFFERENTIAL/PLATELET - Abnormal; Notable for the following:    RDW 15.6 (*)    All other components within normal limits  URINALYSIS, ROUTINE W REFLEX MICROSCOPIC - Abnormal; Notable for the following:    Glucose, UA 150 (*)    Ketones, ur 5 (*)    Protein, ur >=300 (*)    Squamous Epithelial / LPF 0-5 (*)    All other components within normal limits  CK - Abnormal; Notable for the following:    Total CK 438 (*)    All other components within normal limits  CBG MONITORING, ED - Abnormal; Notable for the following:    Glucose-Capillary 189 (*)    All other components within normal limits  CULTURE, BLOOD (ROUTINE X 2)  CULTURE, BLOOD (ROUTINE X 2)  URINE CULTURE  PROTIME-INR  I-STAT CG4 LACTIC ACID, ED    EKG ED ECG REPORT   Date: 07/15/2016 2200  Rate: 100  Rhythm: sinus tachycardia  QRS Axis: normal  Intervals: normal  ST/T Wave abnormalities: nonspecific ST changes  Conduction Disutrbances:none  Narrative Interpretation: LVH noted   I have personally reviewed the EKG tracing and agree with the computerized printout as noted.  Radiology Dg Chest 2 View  Result Date: 07/15/2016 CLINICAL DATA:  Fever.  Altered mental status. EXAM: CHEST  2 VIEW COMPARISON:  10/10/2014 FINDINGS: Cardiomegaly with tortuous thoracic aorta. Minimal scarring in the right midlung zone. No pulmonary edema, pleural effusion, focal airspace disease or pneumothorax. No acute osseous abnormalities are seen. IMPRESSION: Cardiomegaly with right midlung scarring.  No evidence of pneumonia. Electronically Signed   By: Jeb Levering M.D.   On: 07/15/2016 23:26    Procedures Procedures (including critical care time)  Medications Ordered in ED Medications  acetaminophen (TYLENOL) tablet 1,000 mg (1,000 mg Oral Given 07/15/16 2245)  ibuprofen (ADVIL,MOTRIN)  tablet 400 mg (400 mg Oral Given 07/15/16 2245)  sodium chloride 0.9 % bolus 1,000 mL (0 mLs Intravenous Stopped 07/15/16 2350)  vancomycin (VANCOCIN) IVPB 1000 mg/200 mL premix (0 mg Intravenous Stopped 07/16/16 0159)  cefTRIAXone (ROCEPHIN) 2 g in dextrose 5 % 50 mL IVPB (0 g Intravenous Stopped 07/16/16 0051)     Initial Impression / Assessment and Plan / ED Course  I have reviewed the triage vital signs and the nursing notes.  Pertinent labs & imaging results  that were available during my care of the patient were reviewed by me and considered in my medical decision making (see chart for details).     12:06 AM Pt in the ED for fever and confusion Initial CXR/labs do not reveal cause of fever Pt is now improving, more alert and less confused Due to unclear cause, antibiotics ordered Will also obtain CT head due to confusion    After monitoring in the ED, pt improved She is at baseline Vitals improved (though suspect bradycardia on vitals is error, not brady when I Was in room) She is ambulatory Unclear cause of fever - denies Ha/CP/SOB/abd pain.  No rash noted CXR negative I advised admission for monitoring/OBS Pt declines Husband feels she is at baseline and would like to take her home My suspicion for meningitis is low Advised need for close PCP re=evaluation   Final Clinical Impressions(s) / ED Diagnoses   Final diagnoses:  Transient alteration of awareness  Acute febrile illness    New Prescriptions Discharge Medication List as of 07/16/2016  3:06 AM     I personally performed the services described in this documentation, which was scribed in my presence. The recorded information has been reviewed and is accurate.         Ripley Fraise, MD 07/16/16 [phone]

## 2016-07-16 ENCOUNTER — Emergency Department (HOSPITAL_COMMUNITY): Payer: Commercial Managed Care - HMO

## 2016-07-16 DIAGNOSIS — I6782 Cerebral ischemia: Secondary | ICD-10-CM | POA: Diagnosis not present

## 2016-07-16 MED ORDER — DEXTROSE 5 % IV SOLN
2.0000 g | Freq: Once | INTRAVENOUS | Status: AC
  Administered 2016-07-16: 2 g via INTRAVENOUS
  Filled 2016-07-16: qty 2

## 2016-07-16 NOTE — ED Notes (Signed)
EDP ask to ambulate pt. Advised pt has been up several times to the restroom.

## 2016-07-16 NOTE — ED Notes (Signed)
Patient assisted to restroom. Patient returned to bed at this time.

## 2016-07-16 NOTE — ED Notes (Signed)
Pt alert & oriented x4, stable gait. Patient given discharge instructions, paperwork & prescription(s). Patient verbalized understanding. Pt left department in wheelchair escorted by staff. Pt left w/ no further questions. 

## 2016-07-17 LAB — URINE CULTURE

## 2016-07-20 LAB — CULTURE, BLOOD (ROUTINE X 2)
Culture: NO GROWTH
Culture: NO GROWTH
Special Requests: ADEQUATE
Special Requests: ADEQUATE

## 2016-08-04 ENCOUNTER — Other Ambulatory Visit: Payer: Self-pay | Admitting: "Endocrinology

## 2016-08-04 DIAGNOSIS — E039 Hypothyroidism, unspecified: Secondary | ICD-10-CM | POA: Diagnosis not present

## 2016-08-04 DIAGNOSIS — E1165 Type 2 diabetes mellitus with hyperglycemia: Secondary | ICD-10-CM | POA: Diagnosis not present

## 2016-08-04 DIAGNOSIS — Z794 Long term (current) use of insulin: Secondary | ICD-10-CM | POA: Diagnosis not present

## 2016-08-04 DIAGNOSIS — E118 Type 2 diabetes mellitus with unspecified complications: Secondary | ICD-10-CM | POA: Diagnosis not present

## 2016-08-04 LAB — COMPREHENSIVE METABOLIC PANEL
ALT: 15 U/L (ref 6–29)
AST: 14 U/L (ref 10–35)
Albumin: 3.3 g/dL — ABNORMAL LOW (ref 3.6–5.1)
Alkaline Phosphatase: 84 U/L (ref 33–130)
BUN: 17 mg/dL (ref 7–25)
CO2: 25 mmol/L (ref 20–31)
Calcium: 8.9 mg/dL (ref 8.6–10.4)
Chloride: 109 mmol/L (ref 98–110)
Creat: 1.14 mg/dL — ABNORMAL HIGH (ref 0.60–0.93)
Glucose, Bld: 134 mg/dL — ABNORMAL HIGH (ref 65–99)
Potassium: 3.7 mmol/L (ref 3.5–5.3)
Sodium: 144 mmol/L (ref 135–146)
Total Bilirubin: 0.3 mg/dL (ref 0.2–1.2)
Total Protein: 5.7 g/dL — ABNORMAL LOW (ref 6.1–8.1)

## 2016-08-04 LAB — TSH: TSH: 2.25 mIU/L

## 2016-08-04 LAB — T4, FREE: Free T4: 1.2 ng/dL (ref 0.8–1.8)

## 2016-08-05 LAB — HEMOGLOBIN A1C
Hgb A1c MFr Bld: 6.2 % — ABNORMAL HIGH (ref <5.7)
Mean Plasma Glucose: 131 mg/dL

## 2016-08-06 ENCOUNTER — Ambulatory Visit (INDEPENDENT_AMBULATORY_CARE_PROVIDER_SITE_OTHER): Payer: Commercial Managed Care - HMO | Admitting: "Endocrinology

## 2016-08-06 ENCOUNTER — Encounter: Payer: Self-pay | Admitting: "Endocrinology

## 2016-08-06 VITALS — BP 168/79 | HR 76 | Ht 64.0 in | Wt 225.0 lb

## 2016-08-06 DIAGNOSIS — Z794 Long term (current) use of insulin: Secondary | ICD-10-CM

## 2016-08-06 DIAGNOSIS — E1165 Type 2 diabetes mellitus with hyperglycemia: Secondary | ICD-10-CM | POA: Diagnosis not present

## 2016-08-06 DIAGNOSIS — Z6838 Body mass index (BMI) 38.0-38.9, adult: Secondary | ICD-10-CM | POA: Diagnosis not present

## 2016-08-06 DIAGNOSIS — E039 Hypothyroidism, unspecified: Secondary | ICD-10-CM | POA: Diagnosis not present

## 2016-08-06 DIAGNOSIS — E118 Type 2 diabetes mellitus with unspecified complications: Secondary | ICD-10-CM

## 2016-08-06 DIAGNOSIS — I10 Essential (primary) hypertension: Secondary | ICD-10-CM

## 2016-08-06 DIAGNOSIS — E782 Mixed hyperlipidemia: Secondary | ICD-10-CM | POA: Diagnosis not present

## 2016-08-06 DIAGNOSIS — IMO0002 Reserved for concepts with insufficient information to code with codable children: Secondary | ICD-10-CM

## 2016-08-06 MED ORDER — INSULIN ASPART 100 UNIT/ML FLEXPEN: PEN_INJECTOR | SUBCUTANEOUS | 2 refills | Status: DC

## 2016-08-06 MED ORDER — INSULIN GLARGINE 100 UNIT/ML SOLOSTAR PEN
60.0000 [IU] | PEN_INJECTOR | Freq: Every day | SUBCUTANEOUS | 2 refills | Status: DC
Start: 2016-08-06 — End: 2016-12-10

## 2016-08-06 MED ORDER — GABAPENTIN 100 MG PO CAPS: 100.0000 mg | ORAL_CAPSULE | Freq: Two times a day (BID) | ORAL | 3 refills | Status: DC | PRN

## 2016-08-06 NOTE — Patient Instructions (Signed)
Advice for weight management -For most of us the best way to lose weight is by diet management. Generally speaking, diet management means restricting carbohydrate consumption to minimum possible (and to unprocessed or minimally processed complex starch) and increasing protein intake (animal or plant source), fruits, and vegetables.  -Sticking to a routine mealtime to eat 3 meals a day and avoiding unnecessary snacks is shown to have a big role in weight control.  -It is better to avoid simple carbohydrates including: Cakes, Desserts, Ice Cream, Soda (diet and regular), Sweet Tea, Candies, Chips, Cookies, Artificial Sweeteners, and "Sugar-free" Products.   -Exercise: 30 minutes a day 3-4 days a week, or 150 minutes a week. Combine stretch, strength, and aerobic activities. You may seek evaluation by your heart doctor prior to initiating exercise if you have high risk for heart disease.  -If you are interested, we can schedule a visit with Holly Baldwin, RDN, CDE for individualized nutrition education.  

## 2016-08-06 NOTE — Progress Notes (Signed)
Subjective:    Patient ID: Holly Baldwin, female    DOB: 03/26/1945,    Past Medical History:  Diagnosis Date  . Anxiety   . Chronic abdominal pain   . Diabetes mellitus   . GERD (gastroesophageal reflux disease)   . Hiatal hernia   . Hypercholesteremia   . Hypertension    Past Surgical History:  Procedure Laterality Date  . ABDOMINAL HYSTERECTOMY    . COLONOSCOPY N/A 06/23/2012   Procedure: COLONOSCOPY;  Surgeon: Rogene Houston, MD;  Location: [facility];  Service: Endoscopy;  Laterality: N/A;  100-moved to 1200 Ann to notify pt  . ESOPHAGOGASTRODUODENOSCOPY N/A 05/06/2012   Procedure: ESOPHAGOGASTRODUODENOSCOPY (EGD);  Surgeon: Rogene Houston, MD;  Location: [facility];  Service: Endoscopy;  Laterality: N/A;  . MASS EXCISION Right 05/26/2012   Procedure: EXCISION NEOPLASM RIGHT THIGH;  Surgeon: Jamesetta So, MD;  Location: [facility];  Service: General;  Laterality: Right;   Social History   Social History  . Marital status: Married    Spouse name: N/A  . Number of children: N/A  . Years of education: N/A   Social History Main Topics  . Smoking status: Never Smoker  . Smokeless tobacco: Never Used  . Alcohol use No  . Drug use: No  . Sexual activity: Not Asked   Other Topics Concern  . None   Social History Narrative  . None   Outpatient Encounter Prescriptions as of 08/06/2016  Medication Sig  . ACCU-CHEK AVIVA PLUS test strip TEST BLOOD SUGAR FOUR TIMES DAILY  . albuterol (PROVENTIL HFA;VENTOLIN HFA) 108 (90 Base) MCG/ACT inhaler Inhale into the lungs every 6 (six) hours as needed for wheezing or shortness of breath.  . B-D ULTRAFINE III SHORT PEN 31G X 8 MM MISC USE AT BEDTIME  . Blood Glucose Monitoring Suppl (ACCU-CHEK AVIVA PLUS) w/Device KIT TEST FOUR TIMES DAILY  . budesonide-formoterol (SYMBICORT) 160-4.5 MCG/ACT inhaler Inhale 2 puffs into the lungs 2 (two) times daily.  . cloNIDine (CATAPRES) 0.2 MG tablet Take 0.2 mg by mouth 2 (two) times daily.    . cycloSPORINE (RESTASIS) 0.05 % ophthalmic emulsion Place 1 drop into both eyes daily.  Marland Kitchen EPINEPHrine (EPI-PEN) 0.3 mg/0.3 mL DEVI Inject 0.3 mg into the muscle once.  Marland Kitchen FLUoxetine (PROZAC) 20 MG capsule Take 20 mg by mouth every morning.   . gabapentin (NEURONTIN) 100 MG capsule Take 1 capsule (100 mg total) by mouth 2 (two) times daily as needed.  . insulin aspart (NOVOLOG FLEXPEN) 100 UNIT/ML FlexPen INJECT 10-16 UNITS INTO THE SKIN THREE TIMES DAILY WITH MEALS.  Marland Kitchen Insulin Glargine (LANTUS SOLOSTAR) 100 UNIT/ML Solostar Pen Inject 60 Units into the skin daily at 10 pm.  . levothyroxine (SYNTHROID, LEVOTHROID) 137 MCG tablet TAKE 1 TABLET EVERY DAY BEFORE BREAKFAST  . magnesium citrate SOLN Take 1 Bottle by mouth once.  . metFORMIN (GLUCOPHAGE) 500 MG tablet TAKE 1 TABLET TWICE DAILY  . ondansetron (ZOFRAN) 4 mg TABS tablet Take 4 tablets by mouth every 8 (eight) hours as needed.  Marland Kitchen oxyCODONE-acetaminophen (PERCOCET/ROXICET) 5-325 MG per tablet Take 1 tablet by mouth every 6 (six) hours as needed.  . simvastatin (ZOCOR) 40 MG tablet TAKE 1 TABLET EVERY DAY  . ziprasidone (GEODON) 80 MG capsule Take 80 mg by mouth at bedtime.    Marland Kitchen zolpidem (AMBIEN) 5 MG tablet Take 5 mg by mouth at bedtime as needed for sleep.  . [DISCONTINUED] insulin aspart (NOVOLOG FLEXPEN) 100 UNIT/ML  FlexPen INJECT 15-21 UNITS INTO THE SKIN THREE TIMES DAILY WITH MEALS.  . [DISCONTINUED] LANTUS SOLOSTAR 100 UNIT/ML Solostar Pen INJECT  70 UNITS SUBCUTANEOUSLY  AT  10PM   No facility-administered encounter medications on file as of 08/06/2016.    ALLERGIES: Allergies  Allergen Reactions  . Bee Venom Anaphylaxis  . Ace Inhibitors   . Penicillins Hives   VACCINATION STATUS:  There is no immunization history on file for this patient.  Diabetes  She presents for her follow-up diabetic visit. She has type 2 diabetes mellitus. Onset time: she was diagnosed at approximate age of 3 years. Her disease course has been  improving. There are no hypoglycemic associated symptoms. Pertinent negatives for hypoglycemia include no confusion, headaches, pallor or seizures. Associated symptoms include fatigue and polydipsia. Pertinent negatives for diabetes include no chest pain, no polyphagia and no polyuria. There are no hypoglycemic complications. Symptoms are improving. Diabetic complications include autonomic neuropathy. Risk factors for coronary artery disease include diabetes mellitus, dyslipidemia, obesity and sedentary lifestyle. Her weight is decreasing steadily. She is following a generally unhealthy diet. She has not had a previous visit with a dietitian. She never participates in exercise. Her home blood glucose trend is decreasing steadily. Her breakfast blood glucose range is generally 130-140 mg/dl. Her lunch blood glucose range is generally 130-140 mg/dl. Her dinner blood glucose range is generally 130-140 mg/dl. Her bedtime blood glucose range is generally 130-140 mg/dl. Her overall blood glucose range is 130-140 mg/dl. An ACE inhibitor/angiotensin II receptor blocker is being taken.  Thyroid Problem  Presents for follow-up visit. Symptoms include fatigue. Patient reports no cold intolerance, diarrhea, heat intolerance or palpitations. The symptoms have been improving. Past treatments include levothyroxine. Her past medical history is significant for hyperlipidemia.  Hyperlipidemia  This is a chronic problem. The current episode started more than 1 year ago. Pertinent negatives include no chest pain, myalgias or shortness of breath. Current antihyperlipidemic treatment includes statins.  Hypertension  This is a chronic problem. The current episode started more than 1 year ago. The problem is controlled. Pertinent negatives include no chest pain, headaches, palpitations or shortness of breath. Past treatments include angiotensin blockers. Identifiable causes of hypertension include a thyroid problem.     Review of  Systems  Constitutional: Positive for fatigue. Negative for unexpected weight change.  HENT: Negative for trouble swallowing and voice change.   Eyes: Negative for visual disturbance.  Respiratory: Negative for cough, shortness of breath and wheezing.   Cardiovascular: Negative for chest pain, palpitations and leg swelling.  Gastrointestinal: Negative for diarrhea, nausea and vomiting.  Endocrine: Positive for polydipsia. Negative for cold intolerance, heat intolerance, polyphagia and polyuria.  Musculoskeletal: Negative for arthralgias and myalgias.  Skin: Negative for color change, pallor, rash and wound.  Neurological: Negative for seizures and headaches.       She complains of bilateral feet paresthesias and tingling.  Psychiatric/Behavioral: Negative for confusion and suicidal ideas.    Objective:    BP (!) 168/79   Pulse 76   Ht _0  (1.626 m)   Wt 225 lb (102.1 kg)   BMI 38.62 kg/m   Wt Readings from Last 3 Encounters:  08/06/16 225 lb (102.1 kg)  07/15/16 235 lb (106.6 kg)  01/31/16 232 lb (105.2 kg)    Physical Exam  Constitutional: She is oriented to person, place, and time. She appears well-developed.  HENT:  Head: Normocephalic and atraumatic.  Eyes: EOM are normal.  Neck: Normal range of motion. Neck supple.  No tracheal deviation present. No thyromegaly present.  Cardiovascular: Normal rate and regular rhythm.   Pulmonary/Chest: Effort normal and breath sounds normal.  Abdominal: Soft. Bowel sounds are normal. There is no tenderness. There is no guarding.  Musculoskeletal: Normal range of motion. She exhibits no edema.  Neurological: She is alert and oriented to person, place, and time. She has normal reflexes. No cranial nerve deficit. Coordination normal.  Skin: Skin is warm and dry. No rash noted. No erythema. No pallor.  Psychiatric: She has a normal mood and affect. Judgment normal.    Results for orders placed or performed in visit on 08/04/16   Comprehensive metabolic panel  Result Value Ref Range   Sodium 144 135 - 146 mmol/L   Potassium 3.7 3.5 - 5.3 mmol/L   Chloride 109 98 - 110 mmol/L   CO2 25 20 - 31 mmol/L   Glucose, Bld 134 (H) 65 - 99 mg/dL   BUN 17 7 - 25 mg/dL   Creat 1.14 (H) 0.60 - 0.93 mg/dL   Total Bilirubin 0.3 0.2 - 1.2 mg/dL   Alkaline Phosphatase 84 33 - 130 U/L   AST 14 10 - 35 U/L   ALT 15 6 - 29 U/L   Total Protein 5.7 (L) 6.1 - 8.1 g/dL   Albumin 3.3 (L) 3.6 - 5.1 g/dL   Calcium 8.9 8.6 - 10.4 mg/dL  TSH  Result Value Ref Range   TSH 2.25 mIU/L  T4, free  Result Value Ref Range   Free T4 1.2 0.8 - 1.8 ng/dL  Hemoglobin A1c  Result Value Ref Range   Hgb A1c MFr Bld 6.2 (H) <5.7 %   Mean Plasma Glucose 131 mg/dL   Diabetic Labs (most recent): Lab Results  Component Value Date   HGBA1C 6.2 (H) 08/04/2016   HGBA1C 6.6 (H) 04/29/2016   HGBA1C 7.1 (H) 01/23/2016     Assessment & Plan:   1. Uncontrolled type 2 diabetes mellitus with complication, with long-term current use of insulin ([place])  Her diabetes is  complicated by neuropathy and patient remains at a high risk for more acute and chronic complications of diabetes which include CAD, CVA, CKD, retinopathy, and neuropathy. These are all discussed in detail with the patient.  Patient came with stable glucose profile, and  recent A1c of 6.2%  Generally improving from 9 %.  Glucose logs and insulin administration records pertaining to this visit,  to be scanned into patient's records.  Recent labs reviewed.   - I have re-counseled the patient on diet management and weight loss  by adopting a carbohydrate restricted / protein rich  Diet.  - Suggestion is made for patient to avoid simple carbohydrates   from her diet including Cakes , Desserts, Ice Cream,  Soda (  diet and regular) , Sweet Tea , Candies,  Chips, Cookies, Artificial Sweeteners,   and "Sugar-free" Products .  This will help patient to have stable blood glucose profile and  potentially avoid unintended  Weight gain.  - Patient is advised to stick to a routine mealtimes to eat 3 meals  a day and avoid unnecessary snacks ( to snack only to correct hypoglycemia).  - The patient  Will be  scheduled with Jearld Fenton, RDN, CDE for individualized DM education.  - I have approached patient with the following individualized plan to manage diabetes and patient agrees.  - She made some dosing errors of NovoLog, given her tight glycemic profile, I advised her to lower Lantus  to 60 units daily at bedtime, lower NovoLog to 10 units 3 times a day before meals plus correction. I discussed the errors she made dosing NovoLog even when her pre-meal blood glucose is below 90 mg/dL. - Continue Metformin 552m po BID.  -Adjustment parameters are given for hypo and hyperglycemia in writing. -Patient is encouraged to call clinic for blood glucose levels less than 70 or above 300 mg /dl.  - Patient specific target  for A1c; LDL, HDL, Triglycerides, and  Waist Circumference were discussed in detail.  2) BP/HTN: Controlled. Continue current medications including ACEI/ARB. 3) Lipids/HPL:  continue statins. 4)  Weight/Diet: CDE consult in progress, exercise, and carbohydrates information provided.  5) hypothyroidism: -  Her repeat thyroid function tests are consistent with appropriate replacement, I advised her to continue levothyroxine 137 g by mouth every morning.  - We discussed about correct intake of levothyroxine, at fasting, with water, separated by at least 30 minutes from breakfast, and separated by more than 4 hours from calcium, iron, multivitamins, acid reflux medications (PPIs). -Patient is made aware of the fact that thyroid hormone replacement is needed for life, dose to be adjusted by periodic monitoring of thyroid function tests.  6) Chronic Care/Health Maintenance:  -Patient is on ACEI/ARB and Statin medications and encouraged to continue to follow up with  Ophthalmology, Podiatrist at least yearly or according to recommendations, and advised to  stay away from smoking. I have recommended yearly flu vaccine and pneumonia vaccination at least every 5 years; moderate intensity exercise for up to 150 minutes weekly; and  sleep for at least 7 hours a day. - I discussed and added gabapentin 100 mg by mouth 3 times a day when necessary for increased pain on her feet. I advised patient to maintain close follow up with her PCP for primary care needs.  Patient is asked to bring meter and  blood glucose logs during her next visit.   Follow up plan: Return in about 3 months (around 11/06/2016) for meter, and logs.  GGlade Lloyd MD Phone: [phone] Fax: [phone]  08/06/2016, 11:42 AM

## 2016-08-19 DIAGNOSIS — F339 Major depressive disorder, recurrent, unspecified: Secondary | ICD-10-CM | POA: Diagnosis not present

## 2016-08-19 DIAGNOSIS — Z794 Long term (current) use of insulin: Secondary | ICD-10-CM | POA: Diagnosis not present

## 2016-08-19 DIAGNOSIS — R030 Elevated blood-pressure reading, without diagnosis of hypertension: Secondary | ICD-10-CM | POA: Diagnosis not present

## 2016-08-19 DIAGNOSIS — N179 Acute kidney failure, unspecified: Secondary | ICD-10-CM | POA: Diagnosis not present

## 2016-08-19 DIAGNOSIS — E1042 Type 1 diabetes mellitus with diabetic polyneuropathy: Secondary | ICD-10-CM | POA: Diagnosis not present

## 2016-08-19 DIAGNOSIS — I160 Hypertensive urgency: Secondary | ICD-10-CM | POA: Diagnosis not present

## 2016-08-19 DIAGNOSIS — F3342 Major depressive disorder, recurrent, in full remission: Secondary | ICD-10-CM | POA: Diagnosis not present

## 2016-08-19 DIAGNOSIS — I674 Hypertensive encephalopathy: Secondary | ICD-10-CM | POA: Diagnosis not present

## 2016-08-19 DIAGNOSIS — I10 Essential (primary) hypertension: Secondary | ICD-10-CM | POA: Diagnosis not present

## 2016-08-19 DIAGNOSIS — G92 Toxic encephalopathy: Secondary | ICD-10-CM | POA: Diagnosis not present

## 2016-08-19 DIAGNOSIS — R509 Fever, unspecified: Secondary | ICD-10-CM | POA: Diagnosis not present

## 2016-08-19 DIAGNOSIS — T43595A Adverse effect of other antipsychotics and neuroleptics, initial encounter: Secondary | ICD-10-CM | POA: Diagnosis not present

## 2016-08-19 DIAGNOSIS — R4182 Altered mental status, unspecified: Secondary | ICD-10-CM | POA: Diagnosis not present

## 2016-08-19 DIAGNOSIS — I340 Nonrheumatic mitral (valve) insufficiency: Secondary | ICD-10-CM | POA: Diagnosis not present

## 2016-08-19 DIAGNOSIS — R55 Syncope and collapse: Secondary | ICD-10-CM | POA: Diagnosis not present

## 2016-08-19 DIAGNOSIS — E1140 Type 2 diabetes mellitus with diabetic neuropathy, unspecified: Secondary | ICD-10-CM | POA: Diagnosis not present

## 2016-08-19 DIAGNOSIS — E1165 Type 2 diabetes mellitus with hyperglycemia: Secondary | ICD-10-CM | POA: Diagnosis not present

## 2016-08-19 DIAGNOSIS — G9341 Metabolic encephalopathy: Secondary | ICD-10-CM | POA: Diagnosis not present

## 2016-09-24 DIAGNOSIS — E6609 Other obesity due to excess calories: Secondary | ICD-10-CM | POA: Diagnosis not present

## 2016-09-24 DIAGNOSIS — R55 Syncope and collapse: Secondary | ICD-10-CM | POA: Diagnosis not present

## 2016-09-24 DIAGNOSIS — Z1389 Encounter for screening for other disorder: Secondary | ICD-10-CM | POA: Diagnosis not present

## 2016-09-24 DIAGNOSIS — J45909 Unspecified asthma, uncomplicated: Secondary | ICD-10-CM | POA: Diagnosis not present

## 2016-09-24 DIAGNOSIS — E860 Dehydration: Secondary | ICD-10-CM | POA: Diagnosis not present

## 2016-09-24 DIAGNOSIS — N183 Chronic kidney disease, stage 3 (moderate): Secondary | ICD-10-CM | POA: Diagnosis not present

## 2016-09-24 DIAGNOSIS — Z6835 Body mass index (BMI) 35.0-35.9, adult: Secondary | ICD-10-CM | POA: Diagnosis not present

## 2016-09-24 DIAGNOSIS — N189 Chronic kidney disease, unspecified: Secondary | ICD-10-CM | POA: Diagnosis not present

## 2016-09-24 DIAGNOSIS — E1129 Type 2 diabetes mellitus with other diabetic kidney complication: Secondary | ICD-10-CM | POA: Diagnosis not present

## 2016-10-06 ENCOUNTER — Other Ambulatory Visit: Payer: Self-pay | Admitting: Family Medicine

## 2016-10-30 ENCOUNTER — Other Ambulatory Visit: Payer: Self-pay | Admitting: "Endocrinology

## 2016-10-30 DIAGNOSIS — E118 Type 2 diabetes mellitus with unspecified complications: Secondary | ICD-10-CM | POA: Diagnosis not present

## 2016-10-30 DIAGNOSIS — E1165 Type 2 diabetes mellitus with hyperglycemia: Secondary | ICD-10-CM | POA: Diagnosis not present

## 2016-10-30 DIAGNOSIS — Z794 Long term (current) use of insulin: Secondary | ICD-10-CM | POA: Diagnosis not present

## 2016-10-30 LAB — RENAL FUNCTION PANEL
Albumin: 3.5 g/dL — ABNORMAL LOW (ref 3.6–5.1)
BUN: 20 mg/dL (ref 7–25)
CO2: 25 mmol/L (ref 20–32)
Calcium: 8.9 mg/dL (ref 8.6–10.4)
Chloride: 109 mmol/L (ref 98–110)
Creat: 1.1 mg/dL — ABNORMAL HIGH (ref 0.60–0.93)
Glucose, Bld: 186 mg/dL — ABNORMAL HIGH (ref 65–99)
Phosphorus: 3.7 mg/dL (ref 2.1–4.3)
Potassium: 3.8 mmol/L (ref 3.5–5.3)
Sodium: 143 mmol/L (ref 135–146)

## 2016-10-31 LAB — HEMOGLOBIN A1C
Hgb A1c MFr Bld: 7 % — ABNORMAL HIGH (ref <5.7)
Mean Plasma Glucose: 154 mg/dL

## 2016-11-06 ENCOUNTER — Ambulatory Visit (INDEPENDENT_AMBULATORY_CARE_PROVIDER_SITE_OTHER): Payer: Medicare HMO | Admitting: "Endocrinology

## 2016-11-06 ENCOUNTER — Encounter: Payer: Self-pay | Admitting: "Endocrinology

## 2016-11-06 VITALS — BP 147/77 | HR 69 | Ht 64.0 in | Wt 222.0 lb

## 2016-11-06 DIAGNOSIS — E782 Mixed hyperlipidemia: Secondary | ICD-10-CM | POA: Diagnosis not present

## 2016-11-06 DIAGNOSIS — I10 Essential (primary) hypertension: Secondary | ICD-10-CM | POA: Diagnosis not present

## 2016-11-06 DIAGNOSIS — E1165 Type 2 diabetes mellitus with hyperglycemia: Secondary | ICD-10-CM | POA: Diagnosis not present

## 2016-11-06 DIAGNOSIS — E118 Type 2 diabetes mellitus with unspecified complications: Secondary | ICD-10-CM

## 2016-11-06 DIAGNOSIS — E039 Hypothyroidism, unspecified: Secondary | ICD-10-CM

## 2016-11-06 DIAGNOSIS — IMO0002 Reserved for concepts with insufficient information to code with codable children: Secondary | ICD-10-CM

## 2016-11-06 DIAGNOSIS — Z6838 Body mass index (BMI) 38.0-38.9, adult: Secondary | ICD-10-CM

## 2016-11-06 DIAGNOSIS — Z794 Long term (current) use of insulin: Secondary | ICD-10-CM

## 2016-11-06 NOTE — Progress Notes (Signed)
Subjective:    Patient ID: Holly Baldwin, female    DOB: 1945/06/21,    Past Medical History:  Diagnosis Date  . Anxiety   . Chronic abdominal pain   . Diabetes mellitus   . GERD (gastroesophageal reflux disease)   . Hiatal hernia   . Hypercholesteremia   . Hypertension    Past Surgical History:  Procedure Laterality Date  . ABDOMINAL HYSTERECTOMY    . COLONOSCOPY N/A 06/23/2012   Procedure: COLONOSCOPY;  Surgeon: Rogene Houston, MD;  Location: [facility];  Service: Endoscopy;  Laterality: N/A;  100-moved to 1200 Ann to notify pt  . ESOPHAGOGASTRODUODENOSCOPY N/A 05/06/2012   Procedure: ESOPHAGOGASTRODUODENOSCOPY (EGD);  Surgeon: Rogene Houston, MD;  Location: [facility];  Service: Endoscopy;  Laterality: N/A;  . MASS EXCISION Right 05/26/2012   Procedure: EXCISION NEOPLASM RIGHT THIGH;  Surgeon: Jamesetta So, MD;  Location: [facility];  Service: General;  Laterality: Right;   Social History   Social History  . Marital status: Married    Spouse name: N/A  . Number of children: N/A  . Years of education: N/A   Social History Main Topics  . Smoking status: Never Smoker  . Smokeless tobacco: Never Used  . Alcohol use No  . Drug use: No  . Sexual activity: Not on file   Other Topics Concern  . Not on file   Social History Narrative  . No narrative on file   Outpatient Encounter Prescriptions as of 11/06/2016  Medication Sig  . capsaicin (ZOSTRIX) 0.025 % cream Apply topically 2 (two) times daily.  Marland Kitchen albuterol (PROVENTIL HFA;VENTOLIN HFA) 108 (90 Base) MCG/ACT inhaler Inhale into the lungs every 6 (six) hours as needed for wheezing or shortness of breath.  . budesonide-formoterol (SYMBICORT) 160-4.5 MCG/ACT inhaler Inhale 2 puffs into the lungs 2 (two) times daily.  . cloNIDine (CATAPRES) 0.1 MG tablet Take 0.2 mg by mouth 2 (two) times daily.   . cycloSPORINE (RESTASIS) 0.05 % ophthalmic emulsion Place 1 drop into both eyes daily.  Marland Kitchen EPINEPHrine (EPI-PEN) 0.3  mg/0.3 mL DEVI Inject 0.3 mg into the muscle once.  Marland Kitchen FLUoxetine (PROZAC) 20 MG capsule Take 20 mg by mouth every morning.   . gabapentin (NEURONTIN) 100 MG capsule Take 1 capsule (100 mg total) by mouth 2 (two) times daily as needed. (Patient taking differently: Take 300 mg by mouth daily. )  . insulin aspart (NOVOLOG FLEXPEN) 100 UNIT/ML FlexPen INJECT 10-16 UNITS INTO THE SKIN THREE TIMES DAILY WITH MEALS.  Marland Kitchen Insulin Glargine (LANTUS SOLOSTAR) 100 UNIT/ML Solostar Pen Inject 60 Units into the skin daily at 10 pm.  . levothyroxine (SYNTHROID, LEVOTHROID) 137 MCG tablet TAKE 1 TABLET EVERY DAY BEFORE BREAKFAST  . magnesium citrate SOLN Take 1 Bottle by mouth once.  . metFORMIN (GLUCOPHAGE) 500 MG tablet TAKE 1 TABLET TWICE DAILY  . ondansetron (ZOFRAN) 4 mg TABS tablet Take 4 tablets by mouth every 8 (eight) hours as needed.  Marland Kitchen oxyCODONE-acetaminophen (PERCOCET/ROXICET) 5-325 MG per tablet Take 1 tablet by mouth every 6 (six) hours as needed.  . simvastatin (ZOCOR) 40 MG tablet TAKE 1 TABLET EVERY DAY  . ziprasidone (GEODON) 80 MG capsule Take 80 mg by mouth at bedtime.    Marland Kitchen zolpidem (AMBIEN) 5 MG tablet Take 5 mg by mouth at bedtime as needed for sleep.  . [DISCONTINUED] ACCU-CHEK AVIVA PLUS test strip TEST BLOOD SUGAR FOUR TIMES DAILY  . [DISCONTINUED] B-D ULTRAFINE III SHORT PEN 31G  X 8 MM MISC USE AT BEDTIME  . [DISCONTINUED] Blood Glucose Monitoring Suppl (ACCU-CHEK AVIVA PLUS) w/Device KIT TEST FOUR TIMES DAILY   No facility-administered encounter medications on file as of 11/06/2016.    ALLERGIES: Allergies  Allergen Reactions  . Bee Venom Anaphylaxis  . Ace Inhibitors   . Penicillins Hives   VACCINATION STATUS:  There is no immunization history on file for this patient.  Diabetes  She presents for her follow-up diabetic visit. She has type 2 diabetes mellitus. Onset time: she was diagnosed at approximate age of 62 years. Her disease course has been stable. There are no  hypoglycemic associated symptoms. Pertinent negatives for hypoglycemia include no confusion, headaches, pallor or seizures. Associated symptoms include fatigue and polydipsia. Pertinent negatives for diabetes include no chest pain, no polyphagia and no polyuria. There are no hypoglycemic complications. Symptoms are stable. Diabetic complications include autonomic neuropathy. Risk factors for coronary artery disease include diabetes mellitus, dyslipidemia, obesity and sedentary lifestyle. Her weight is decreasing steadily. She is following a generally unhealthy diet. She has not had a previous visit with a dietitian. She never participates in exercise. Her home blood glucose trend is decreasing steadily. Her breakfast blood glucose range is generally 130-140 mg/dl. Her lunch blood glucose range is generally 130-140 mg/dl. Her dinner blood glucose range is generally 130-140 mg/dl. Her bedtime blood glucose range is generally 130-140 mg/dl. Her overall blood glucose range is 130-140 mg/dl. An ACE inhibitor/angiotensin II receptor blocker is being taken.  Thyroid Problem  Presents for follow-up visit. Symptoms include fatigue. Patient reports no cold intolerance, diarrhea, heat intolerance or palpitations. The symptoms have been improving. Past treatments include levothyroxine. Her past medical history is significant for hyperlipidemia.  Hyperlipidemia  This is a chronic problem. The current episode started more than 1 year ago. Pertinent negatives include no chest pain, myalgias or shortness of breath. Current antihyperlipidemic treatment includes statins.  Hypertension  This is a chronic problem. The current episode started more than 1 year ago. The problem is controlled. Pertinent negatives include no chest pain, headaches, palpitations or shortness of breath. Past treatments include angiotensin blockers. Identifiable causes of hypertension include a thyroid problem.     Review of Systems  Constitutional:  Positive for fatigue. Negative for unexpected weight change.  HENT: Negative for trouble swallowing and voice change.   Eyes: Negative for visual disturbance.  Respiratory: Negative for cough, shortness of breath and wheezing.   Cardiovascular: Negative for chest pain, palpitations and leg swelling.  Gastrointestinal: Negative for diarrhea, nausea and vomiting.  Endocrine: Positive for polydipsia. Negative for cold intolerance, heat intolerance, polyphagia and polyuria.  Musculoskeletal: Negative for arthralgias and myalgias.  Skin: Negative for color change, pallor, rash and wound.  Neurological: Negative for seizures and headaches.       She complains of bilateral feet paresthesias and tingling.  Psychiatric/Behavioral: Negative for confusion and suicidal ideas.    Objective:    BP (!) 147/77   Pulse 69   Ht _0  (1.626 m)   Wt 222 lb (100.7 kg)   BMI 38.11 kg/m   Wt Readings from Last 3 Encounters:  11/06/16 222 lb (100.7 kg)  08/06/16 225 lb (102.1 kg)  07/15/16 235 lb (106.6 kg)    Physical Exam  Constitutional: She is oriented to person, place, and time. She appears well-developed.  HENT:  Head: Normocephalic and atraumatic.  Eyes: EOM are normal.  Neck: Normal range of motion. Neck supple. No tracheal deviation present. No thyromegaly present.  Cardiovascular: Normal rate and regular rhythm.   Pulmonary/Chest: Effort normal and breath sounds normal.  Abdominal: Soft. Bowel sounds are normal. There is no tenderness. There is no guarding.  Musculoskeletal: Normal range of motion. She exhibits no edema.  Neurological: She is alert and oriented to person, place, and time. She has normal reflexes. No cranial nerve deficit. Coordination normal.  Skin: Skin is warm and dry. No rash noted. No erythema. No pallor.  Psychiatric: She has a normal mood and affect. Judgment normal.    Results for orders placed or performed in visit on 10/30/16  Renal function panel  Result  Value Ref Range   Sodium 143 135 - 146 mmol/L   Potassium 3.8 3.5 - 5.3 mmol/L   Chloride 109 98 - 110 mmol/L   CO2 25 20 - 32 mmol/L   Glucose, Bld 186 (H) 65 - 99 mg/dL   BUN 20 7 - 25 mg/dL   Creat 1.10 (H) 0.60 - 0.93 mg/dL   Albumin 3.5 (L) 3.6 - 5.1 g/dL   Calcium 8.9 8.6 - 10.4 mg/dL   Phosphorus 3.7 2.1 - 4.3 mg/dL  Hemoglobin A1c  Result Value Ref Range   Hgb A1c MFr Bld 7.0 (H) <5.7 %   Mean Plasma Glucose 154 mg/dL   Diabetic Labs (most recent): Lab Results  Component Value Date   HGBA1C 7.0 (H) 10/30/2016   HGBA1C 6.2 (H) 08/04/2016   HGBA1C 6.6 (H) 04/29/2016     Assessment & Plan:   1. Uncontrolled type 2 diabetes mellitus with complication, with long-term current use of insulin ([place])  Her diabetes is  complicated by neuropathy and patient remains at a high risk for more acute and chronic complications of diabetes which include CAD, CVA, CKD, retinopathy, and neuropathy. These are all discussed in detail with the patient.  Patient came with stable glucose profile, and  recent A1c of 7%  Generally improving from 9 %.  Glucose logs and insulin administration records pertaining to this visit,  to be scanned into patient's records.  She made insulin dosing errors. Recent labs reviewed.   - I have re-counseled the patient on diet management and weight loss  by adopting a carbohydrate restricted / protein rich  Diet.  - Suggestion is made for her to avoid simple carbohydrates  from her diet including Cakes, Sweet Desserts, Ice Cream, Soda (diet and regular), Sweet Tea, Candies, Chips, Cookies, Store Bought Juices, Alcohol in Excess of  1-2 drinks a day, Artificial Sweeteners, and "Sugar-free" Products. This will help patient to have stable blood glucose profile and potentially avoid unintended weight gain.   - Patient is advised to stick to a routine mealtimes to eat 3 meals  a day and avoid unnecessary snacks ( to snack only to correct hypoglycemia).  - I have  approached patient with the following individualized plan to manage diabetes and patient agrees.  - She made some dosing errors of  both Lantus and NovoLog once again. I have discussed these details with her. - I advised her to lower Lantus to 60 units daily at bedtime, lower NovoLog to 10 units 3 times a day before meals plus correction. I discussed the errors she made dosing NovoLog even when her pre-meal blood glucose is below 90 mg/dL. - Continue Metformin 536m po BID.  -Adjustment parameters are given for hypo and hyperglycemia in writing. -Patient is encouraged to call clinic for blood glucose levels less than 70 or above 300 mg /dl.  - Patient specific  target  for A1c; LDL, HDL, Triglycerides, and  Waist Circumference were discussed in detail.  2) BP/HTN: Controlled. Continue current medications including ACEI/ARB. 3) Lipids/HPL:  continue statins. 4)  Weight/Diet: CDE consult in progress, exercise, and carbohydrates information provided.  5) hypothyroidism: -  Her repeat thyroid function tests are consistent with appropriate replacement, I advised her to continue levothyroxine 137 g by mouth every morning.  - We discussed about correct intake of levothyroxine, at fasting, with water, separated by at least 30 minutes from breakfast, and separated by more than 4 hours from calcium, iron, multivitamins, acid reflux medications (PPIs). -Patient is made aware of the fact that thyroid hormone replacement is needed for life, dose to be adjusted by periodic monitoring of thyroid function tests.  6) Chronic Care/Health Maintenance:  -Patient is on ACEI/ARB and Statin medications and encouraged to continue to follow up with Ophthalmology, Podiatrist at least yearly or according to recommendations, and advised to  stay away from smoking. I have recommended yearly flu vaccine and pneumonia vaccination at least every 5 years; moderate intensity exercise for up to 150 minutes weekly; and  sleep  for at least 7 hours a day.  I advised patient to maintain close follow up with her PCP for primary care needs.  Patient is asked to bring meter and  blood glucose logs during her next visit.  - Time spent with the patient: 25 min, of which >50% was spent in reviewing her sugar logs , discussing her hypo- and hyper-glycemic episodes, reviewing her current and  previous labs and insulin doses and developing a plan to avoid hypo- and hyper-glycemia.   Follow up plan: Return in about 3 months (around 02/05/2017) for meter, and logs.  Glade Lloyd, MD Phone: [phone]  Fax: [phone]  This note was partially dictated with voice recognition software. Similar sounding words can be transcribed inadequately or may not  be corrected upon review.  11/06/2016, 12:50 PM

## 2016-11-06 NOTE — Patient Instructions (Signed)

## 2016-11-21 ENCOUNTER — Other Ambulatory Visit (HOSPITAL_COMMUNITY): Payer: Self-pay | Admitting: Family Medicine

## 2016-11-21 DIAGNOSIS — Z1231 Encounter for screening mammogram for malignant neoplasm of breast: Secondary | ICD-10-CM

## 2016-12-10 ENCOUNTER — Other Ambulatory Visit: Payer: Self-pay | Admitting: "Endocrinology

## 2016-12-10 ENCOUNTER — Other Ambulatory Visit: Payer: Self-pay | Admitting: Family Medicine

## 2016-12-11 ENCOUNTER — Ambulatory Visit (HOSPITAL_COMMUNITY)
Admission: RE | Admit: 2016-12-11 | Discharge: 2016-12-11 | Disposition: A | Discharge status: Home / Self Care | Payer: Medicare HMO | Source: Ambulatory Visit | Attending: Family Medicine | Admitting: Family Medicine

## 2016-12-11 DIAGNOSIS — Z23 Encounter for immunization: Secondary | ICD-10-CM | POA: Diagnosis not present

## 2016-12-11 DIAGNOSIS — Z1231 Encounter for screening mammogram for malignant neoplasm of breast: Secondary | ICD-10-CM | POA: Insufficient documentation

## 2016-12-17 ENCOUNTER — Other Ambulatory Visit (HOSPITAL_COMMUNITY): Payer: Self-pay | Admitting: Family Medicine

## 2016-12-17 DIAGNOSIS — N6310 Unspecified lump in the right breast, unspecified quadrant: Secondary | ICD-10-CM

## 2016-12-23 ENCOUNTER — Ambulatory Visit (HOSPITAL_COMMUNITY)
Admission: RE | Admit: 2016-12-23 | Discharge: 2016-12-23 | Disposition: A | Discharge status: Home / Self Care | Payer: Medicare HMO | Source: Ambulatory Visit | Attending: Family Medicine | Admitting: Family Medicine

## 2016-12-23 DIAGNOSIS — R928 Other abnormal and inconclusive findings on diagnostic imaging of breast: Secondary | ICD-10-CM | POA: Diagnosis not present

## 2016-12-23 DIAGNOSIS — N6489 Other specified disorders of breast: Secondary | ICD-10-CM | POA: Diagnosis not present

## 2016-12-23 DIAGNOSIS — N6310 Unspecified lump in the right breast, unspecified quadrant: Secondary | ICD-10-CM

## 2016-12-23 DIAGNOSIS — L723 Sebaceous cyst: Secondary | ICD-10-CM | POA: Insufficient documentation

## 2017-01-06 ENCOUNTER — Encounter (HOSPITAL_COMMUNITY): Payer: Self-pay

## 2017-02-11 ENCOUNTER — Ambulatory Visit: Payer: Self-pay | Admitting: "Endocrinology

## 2017-02-18 ENCOUNTER — Other Ambulatory Visit: Payer: Self-pay | Admitting: "Endocrinology

## 2017-02-19 DIAGNOSIS — M79674 Pain in right toe(s): Secondary | ICD-10-CM | POA: Diagnosis not present

## 2017-02-19 DIAGNOSIS — Z6837 Body mass index (BMI) 37.0-37.9, adult: Secondary | ICD-10-CM | POA: Diagnosis not present

## 2017-03-09 DIAGNOSIS — Z794 Long term (current) use of insulin: Secondary | ICD-10-CM | POA: Diagnosis not present

## 2017-03-09 DIAGNOSIS — Z6838 Body mass index (BMI) 38.0-38.9, adult: Secondary | ICD-10-CM | POA: Diagnosis not present

## 2017-03-09 DIAGNOSIS — E039 Hypothyroidism, unspecified: Secondary | ICD-10-CM | POA: Diagnosis not present

## 2017-03-09 DIAGNOSIS — E782 Mixed hyperlipidemia: Secondary | ICD-10-CM | POA: Diagnosis not present

## 2017-03-09 DIAGNOSIS — E118 Type 2 diabetes mellitus with unspecified complications: Secondary | ICD-10-CM | POA: Diagnosis not present

## 2017-03-09 DIAGNOSIS — Z0000 Encounter for general adult medical examination without abnormal findings: Secondary | ICD-10-CM | POA: Diagnosis not present

## 2017-03-09 DIAGNOSIS — E1165 Type 2 diabetes mellitus with hyperglycemia: Secondary | ICD-10-CM | POA: Diagnosis not present

## 2017-03-10 LAB — HEMOGLOBIN A1C
Hgb A1c MFr Bld: 7.3 % of total Hgb — ABNORMAL HIGH (ref <5.7)
Mean Plasma Glucose: 163 (calc)
eAG (mmol/L): 9 (calc)

## 2017-03-10 LAB — MICROALBUMIN / CREATININE URINE RATIO
Creatinine, Urine: 123 mg/dL (ref 20–275)
Microalb, Ur: >600 mg/dL

## 2017-03-10 LAB — TSH: TSH: 2.07 mIU/L (ref 0.40–4.50)

## 2017-03-10 LAB — RENAL FUNCTION PANEL
Albumin: 3.4 g/dL — ABNORMAL LOW (ref 3.6–5.1)
BUN/Creatinine Ratio: 17 (calc) (ref 6–22)
BUN: 20 mg/dL (ref 7–25)
CO2: 31 mmol/L (ref 20–32)
Calcium: 9.2 mg/dL (ref 8.6–10.4)
Chloride: 108 mmol/L (ref 98–110)
Creat: 1.2 mg/dL — ABNORMAL HIGH (ref 0.60–0.93)
Glucose, Bld: 121 mg/dL — ABNORMAL HIGH (ref 65–99)
Phosphorus: 4.6 mg/dL — ABNORMAL HIGH (ref 2.1–4.3)
Potassium: 4.3 mmol/L (ref 3.5–5.3)
Sodium: 146 mmol/L (ref 135–146)

## 2017-03-10 LAB — LIPID PANEL
Cholesterol: 218 mg/dL — ABNORMAL HIGH (ref <200)
HDL: 63 mg/dL (ref >50)
LDL Cholesterol (Calc): 127 mg/dL (calc) — ABNORMAL HIGH
Non-HDL Cholesterol (Calc): 155 mg/dL (calc) — ABNORMAL HIGH (ref <130)
Total CHOL/HDL Ratio: 3.5 (calc) (ref <5.0)
Triglycerides: 167 mg/dL — ABNORMAL HIGH (ref <150)

## 2017-03-10 LAB — T4, FREE: Free T4: 1.1 ng/dL (ref 0.8–1.8)

## 2017-03-10 LAB — VITAMIN D 25 HYDROXY (VIT D DEFICIENCY, FRACTURES): Vit D, 25-Hydroxy: 21 ng/mL — ABNORMAL LOW (ref 30–100)

## 2017-03-14 ENCOUNTER — Other Ambulatory Visit: Payer: Self-pay | Admitting: "Endocrinology

## 2017-03-17 ENCOUNTER — Encounter: Payer: Self-pay | Admitting: "Endocrinology

## 2017-03-17 ENCOUNTER — Ambulatory Visit (INDEPENDENT_AMBULATORY_CARE_PROVIDER_SITE_OTHER): Payer: Medicare HMO | Admitting: "Endocrinology

## 2017-03-17 VITALS — BP 164/72 | HR 72 | Ht 64.0 in | Wt 220.0 lb

## 2017-03-17 DIAGNOSIS — E118 Type 2 diabetes mellitus with unspecified complications: Secondary | ICD-10-CM | POA: Diagnosis not present

## 2017-03-17 DIAGNOSIS — E1165 Type 2 diabetes mellitus with hyperglycemia: Secondary | ICD-10-CM | POA: Diagnosis not present

## 2017-03-17 DIAGNOSIS — Z6838 Body mass index (BMI) 38.0-38.9, adult: Secondary | ICD-10-CM | POA: Diagnosis not present

## 2017-03-17 DIAGNOSIS — I10 Essential (primary) hypertension: Secondary | ICD-10-CM

## 2017-03-17 DIAGNOSIS — E039 Hypothyroidism, unspecified: Secondary | ICD-10-CM

## 2017-03-17 DIAGNOSIS — E559 Vitamin D deficiency, unspecified: Secondary | ICD-10-CM

## 2017-03-17 DIAGNOSIS — Z794 Long term (current) use of insulin: Secondary | ICD-10-CM

## 2017-03-17 DIAGNOSIS — IMO0002 Reserved for concepts with insufficient information to code with codable children: Secondary | ICD-10-CM

## 2017-03-17 MED ORDER — INSULIN GLARGINE 100 UNIT/ML SOLOSTAR PEN: PEN_INJECTOR | SUBCUTANEOUS | 2 refills | Status: DC

## 2017-03-17 MED ORDER — VITAMIN D3 125 MCG (5000 UT) PO CAPS: 5000.0000 [IU] | ORAL_CAPSULE | Freq: Every day | ORAL | 0 refills | Status: DC

## 2017-03-17 NOTE — Patient Instructions (Signed)

## 2017-03-17 NOTE — Progress Notes (Signed)
Subjective:    Patient ID: Holly Baldwin, female    DOB: Feb 10, 1946,    Past Medical History:  Diagnosis Date  . Anxiety   . Chronic abdominal pain   . Diabetes mellitus   . GERD (gastroesophageal reflux disease)   . Hiatal hernia   . Hypercholesteremia   . Hypertension    Past Surgical History:  Procedure Laterality Date  . ABDOMINAL HYSTERECTOMY    . COLONOSCOPY N/A 06/23/2012   Procedure: COLONOSCOPY;  Surgeon: Rogene Houston, MD;  Location: [facility];  Service: Endoscopy;  Laterality: N/A;  100-moved to 1200 Ann to notify pt  . ESOPHAGOGASTRODUODENOSCOPY N/A 05/06/2012   Procedure: ESOPHAGOGASTRODUODENOSCOPY (EGD);  Surgeon: Rogene Houston, MD;  Location: [facility];  Service: Endoscopy;  Laterality: N/A;  . MASS EXCISION Right 05/26/2012   Procedure: EXCISION NEOPLASM RIGHT THIGH;  Surgeon: Jamesetta So, MD;  Location: [facility];  Service: General;  Laterality: Right;   Social History   Socioeconomic History  . Marital status: Married    Spouse name: None  . Number of children: None  . Years of education: None  . Highest education level: None  Social Needs  . Financial resource strain: None  . Food insecurity - worry: None  . Food insecurity - inability: None  . Transportation needs - medical: None  . Transportation needs - non-medical: None  Occupational History  . None  Tobacco Use  . Smoking status: Never Smoker  . Smokeless tobacco: Never Used  Substance and Sexual Activity  . Alcohol use: No  . Drug use: No  . Sexual activity: None  Other Topics Concern  . None  Social History Narrative  . None   Outpatient Encounter Medications as of 03/17/2017  Medication Sig  . albuterol (PROVENTIL HFA;VENTOLIN HFA) 108 (90 Base) MCG/ACT inhaler Inhale into the lungs every 6 (six) hours as needed for wheezing or shortness of breath.  . budesonide-formoterol (SYMBICORT) 160-4.5 MCG/ACT inhaler Inhale 2 puffs into the lungs 2 (two) times daily.  . capsaicin  (ZOSTRIX) 0.025 % cream Apply topically 2 (two) times daily.  . Cholecalciferol (VITAMIN D3) 5000 units CAPS Take 1 capsule (5,000 Units total) by mouth daily.  . cloNIDine (CATAPRES) 0.1 MG tablet Take 0.2 mg by mouth 2 (two) times daily.   . cycloSPORINE (RESTASIS) 0.05 % ophthalmic emulsion Place 1 drop into both eyes daily.  Marland Kitchen EPINEPHrine (EPI-PEN) 0.3 mg/0.3 mL DEVI Inject 0.3 mg into the muscle once.  Marland Kitchen FLUoxetine (PROZAC) 20 MG capsule Take 20 mg by mouth every morning.   . gabapentin (NEURONTIN) 100 MG capsule Take 1 capsule (100 mg total) by mouth 2 (two) times daily as needed. (Patient taking differently: Take 300 mg by mouth daily. )  . Insulin Glargine (LANTUS SOLOSTAR) 100 UNIT/ML Solostar Pen INJECT  50 UNITS SUBCUTANEOUSLY  AT  10PM  . levothyroxine (SYNTHROID, LEVOTHROID) 137 MCG tablet TAKE 1 TABLET EVERY DAY BEFORE BREAKFAST  . magnesium citrate SOLN Take 1 Bottle by mouth once.  . metFORMIN (GLUCOPHAGE) 500 MG tablet TAKE 1 TABLET TWICE DAILY  . NOVOLOG FLEXPEN 100 UNIT/ML FlexPen INJECT 15-21 UNITS INTO THE SKIN THREE TIMES DAILY WITH MEALS.  Marland Kitchen ondansetron (ZOFRAN) 4 mg TABS tablet Take 4 tablets by mouth every 8 (eight) hours as needed.  Marland Kitchen oxyCODONE-acetaminophen (PERCOCET/ROXICET) 5-325 MG per tablet Take 1 tablet by mouth every 6 (six) hours as needed.  . simvastatin (ZOCOR) 40 MG tablet TAKE 1 TABLET EVERY DAY  .  ziprasidone (GEODON) 80 MG capsule Take 80 mg by mouth at bedtime.    Marland Kitchen zolpidem (AMBIEN) 5 MG tablet Take 5 mg by mouth at bedtime as needed for sleep.  . [DISCONTINUED] LANTUS SOLOSTAR 100 UNIT/ML Solostar Pen INJECT  70 UNITS SUBCUTANEOUSLY  AT  10PM   No facility-administered encounter medications on file as of 03/17/2017.    ALLERGIES: Allergies  Allergen Reactions  . Bee Venom Anaphylaxis  . Ace Inhibitors   . Penicillins Hives   VACCINATION STATUS:  There is no immunization history on file for this patient.  Diabetes  She presents for her  follow-up diabetic visit. She has type 2 diabetes mellitus. Onset time: she was diagnosed at approximate age of 57 years. Her disease course has been stable. There are no hypoglycemic associated symptoms. Pertinent negatives for hypoglycemia include no confusion, headaches, pallor or seizures. Associated symptoms include fatigue and polydipsia. Pertinent negatives for diabetes include no chest pain, no polyphagia and no polyuria. There are no hypoglycemic complications. Symptoms are stable. Diabetic complications include autonomic neuropathy. Risk factors for coronary artery disease include diabetes mellitus, dyslipidemia, obesity and sedentary lifestyle. Her weight is decreasing steadily. She is following a generally unhealthy diet. She has not had a previous visit with a dietitian. She never participates in exercise. Her home blood glucose trend is decreasing steadily. Her breakfast blood glucose range is generally 130-140 mg/dl. Her lunch blood glucose range is generally 130-140 mg/dl. Her dinner blood glucose range is generally 130-140 mg/dl. Her bedtime blood glucose range is generally 130-140 mg/dl. Her overall blood glucose range is 130-140 mg/dl. An ACE inhibitor/angiotensin II receptor blocker is being taken.  Thyroid Problem  Presents for follow-up visit. Symptoms include fatigue. Patient reports no cold intolerance, diarrhea, heat intolerance or palpitations. The symptoms have been improving. Past treatments include levothyroxine. Her past medical history is significant for hyperlipidemia.  Hyperlipidemia  This is a chronic problem. The current episode started more than 1 year ago. Pertinent negatives include no chest pain, myalgias or shortness of breath. Current antihyperlipidemic treatment includes statins.  Hypertension  This is a chronic problem. The current episode started more than 1 year ago. The problem is controlled. Pertinent negatives include no chest pain, headaches, palpitations or  shortness of breath. Past treatments include angiotensin blockers. Identifiable causes of hypertension include a thyroid problem.     Review of Systems  Constitutional: Positive for fatigue. Negative for unexpected weight change.  HENT: Negative for trouble swallowing and voice change.   Eyes: Negative for visual disturbance.  Respiratory: Negative for cough, shortness of breath and wheezing.   Cardiovascular: Negative for chest pain, palpitations and leg swelling.  Gastrointestinal: Negative for diarrhea, nausea and vomiting.  Endocrine: Positive for polydipsia. Negative for cold intolerance, heat intolerance, polyphagia and polyuria.  Musculoskeletal: Negative for arthralgias and myalgias.  Skin: Negative for color change, pallor, rash and wound.  Neurological: Negative for seizures and headaches.       She complains of bilateral feet paresthesias and tingling.  Psychiatric/Behavioral: Negative for confusion and suicidal ideas.    Objective:    BP (!) 164/72   Pulse 72   Ht 5\' 4"  (1.626 m)   Wt 220 lb (99.8 kg)   BMI 37.76 kg/m   Wt Readings from Last 3 Encounters:  03/17/17 220 lb (99.8 kg)  11/06/16 222 lb (100.7 kg)  08/06/16 225 lb (102.1 kg)    Physical Exam  Constitutional: She is oriented to person, place, and time. She appears well-developed.  HENT:  Head: Normocephalic and atraumatic.  Eyes: EOM are normal.  Neck: Normal range of motion. Neck supple. No tracheal deviation present. No thyromegaly present.  Cardiovascular: Normal rate and regular rhythm.  Pulmonary/Chest: Effort normal and breath sounds normal.  Abdominal: Soft. Bowel sounds are normal. There is no tenderness. There is no guarding.  Musculoskeletal: Normal range of motion. She exhibits no edema.  Neurological: She is alert and oriented to person, place, and time. She has normal reflexes. No cranial nerve deficit. Coordination normal.  Skin: Skin is warm and dry. No rash noted. No erythema. No  pallor.  Psychiatric: She has a normal mood and affect. Judgment normal.    Results for orders placed or performed in visit on 11/06/16  Renal function panel  Result Value Ref Range   Glucose, Bld 121 (H) 65 - 99 mg/dL   BUN 20 7 - 25 mg/dL   Creat 1.20 (H) 0.60 - 0.93 mg/dL   BUN/Creatinine Ratio 17 6 - 22 (calc)   Sodium 146 135 - 146 mmol/L   Potassium 4.3 3.5 - 5.3 mmol/L   Chloride 108 98 - 110 mmol/L   CO2 31 20 - 32 mmol/L   Calcium 9.2 8.6 - 10.4 mg/dL   Phosphorus 4.6 (H) 2.1 - 4.3 mg/dL   Albumin 3.4 (L) 3.6 - 5.1 g/dL  Hemoglobin A1c  Result Value Ref Range   Hgb A1c MFr Bld 7.3 (H) <5.7 % of total Hgb   Mean Plasma Glucose 163 (calc)   eAG (mmol/L) 9.0 (calc)  TSH  Result Value Ref Range   TSH 2.07 0.40 - 4.50 mIU/L  T4, free  Result Value Ref Range   Free T4 1.1 0.8 - 1.8 ng/dL  Lipid panel  Result Value Ref Range   Cholesterol 218 (H) <200 mg/dL   HDL 63 >50 mg/dL   Triglycerides 167 (H) <150 mg/dL   LDL Cholesterol (Calc) 127 (H) mg/dL (calc)   Total CHOL/HDL Ratio 3.5 <5.0 (calc)   Non-HDL Cholesterol (Calc) 155 (H) <130 mg/dL (calc)  Microalbumin / creatinine urine ratio  Result Value Ref Range   Creatinine, Urine 123 20 - 275 mg/dL   Microalb, Ur >600.0 mg/dL   Microalb Creat Ratio  <30 mcg/mg creat  VITAMIN D 25 Hydroxy (Vit-D Deficiency, Fractures)  Result Value Ref Range   Vit D, 25-Hydroxy 21 (L) 30 - 100 ng/mL   Diabetic Labs (most recent): Lab Results  Component Value Date   HGBA1C 7.3 (H) 03/09/2017   HGBA1C 7.0 (H) 10/30/2016   HGBA1C 6.2 (H) 08/04/2016     Assessment & Plan:   1. Uncontrolled type 2 diabetes mellitus with complication, with long-term current use of insulin ([place])  Her diabetes is  complicated by neuropathy and patient remains at a high risk for more acute and chronic complications of diabetes which include CAD, CVA, CKD, retinopathy, and neuropathy. These are all discussed in detail with the patient.  Patient came  with stable glucose profile, and  recent A1c of 7.3%  Generally improving from 9 %.  Glucose logs and insulin administration records pertaining to this visit,  to be scanned into patient's records.  She made insulin dosing errors. Recent labs reviewed.   - I have re-counseled the patient on diet management and weight loss  by adopting a carbohydrate restricted / protein rich  Diet.  -  Suggestion is made for her to avoid simple carbohydrates  from her diet including Cakes, Sweet Desserts / Pastries, Ice Cream,  Soda (diet and regular), Sweet Tea, Candies, Chips, Cookies, Store Bought Juices, Alcohol in Excess of  1-2 drinks a day, Artificial Sweeteners, and "Sugar-free" Products. This will help patient to have stable blood glucose profile and potentially avoid unintended weight gain.  - Patient is advised to stick to a routine mealtimes to eat 3 meals  a day and avoid unnecessary snacks ( to snack only to correct hypoglycemia).  - I have approached patient with the following individualized plan to manage diabetes and patient agrees.  - She still makes dosing errors of  both Lantus and NovoLog once again. I have discussed these details with her. - I advised her to lower Lantus to 50 units daily at bedtime, continue  NovoLog to 10 units ( only during meals)  3 times a day before meals plus correction. I discussed the errors she made dosing NovoLog even when her pre-meal blood glucose is below 90 mg/dL.  - Continue Metformin 500mg   by mouth twice a day with meals.  -  Adjustment  parameters are given for hypo and hyperglycemia in writing. -Patient is encouraged to call clinic for blood glucose levels less than 70 or above 300 mg /dl.  - Patient specific target  for A1c; LDL, HDL, Triglycerides, and  Waist Circumference were discussed in detail.  2) BP/HTN:  her blood pressure is not controlled, however close to target.  Continue current medications including ACEI/ARB.  3) Lipids/HPL:  continue  statins.  4)  Weight/Diet: CDE consult in progress, exercise, and carbohydrates information provided.  5) hypothyroidism: - Her thyroid function tests are consistent with appropriate replacement. - I advised her to continue levothyroxine 137 g by mouth every morning.   - We discussed about correct intake of levothyroxine, at fasting, with water, separated by at least 30 minutes from breakfast, and separated by more than 4 hours from calcium, iron, multivitamins, acid reflux medications (PPIs). -Patient is made aware of the fact that thyroid hormone replacement is needed for life, dose to be adjusted by periodic monitoring of thyroid function tests.  6) Chronic Care/Health Maintenance:  -Patient is on ACEI/ARB and Statin medications and encouraged to continue to follow up with Ophthalmology, Podiatrist at least yearly or according to recommendations, and advised to  stay away from smoking. I have recommended yearly flu vaccine and pneumonia vaccination at least every 5 years; moderate intensity exercise for up to 150 minutes weekly; and  sleep for at least 7 hours a day.  I advised patient to maintain close follow up with her PCP for primary care needs.  - Time spent with the patient: 25 min, of which >50% was spent in reviewing her blood glucose logs , discussing her hypo- and hyper-glycemic episodes, reviewing her current and  previous labs and insulin doses and developing a plan to avoid hypo- and hyper-glycemia. Please refer to Patient Instructions for Blood Glucose Monitoring and Insulin/Medications Dosing Guide"  in media tab for additional information.   Follow up plan: Return in about 3 months (around 06/15/2017) for follow up with pre-visit labs, meter, and logs.  Glade Lloyd, MD Phone: [phone]  Fax: [phone]  This note was partially dictated with voice recognition software. Similar sounding words can be transcribed inadequately or may not  be corrected upon  review.  03/17/2017, 2:09 PM

## 2017-03-25 ENCOUNTER — Other Ambulatory Visit: Payer: Self-pay | Admitting: Family Medicine

## 2017-03-25 ENCOUNTER — Other Ambulatory Visit: Payer: Self-pay | Admitting: "Endocrinology

## 2017-04-02 ENCOUNTER — Telehealth: Payer: Self-pay

## 2017-04-02 NOTE — Telephone Encounter (Signed)
Pt had concerns of her Vitamin D causing cramps in her side. She states however she had this before starting the Vit D. I advised her to call her PCP

## 2017-04-02 NOTE — Telephone Encounter (Signed)
Pt still having cramps in her legs and back and wants to talk with you or Dr Dorris Fetch.

## 2017-04-06 DIAGNOSIS — J069 Acute upper respiratory infection, unspecified: Secondary | ICD-10-CM | POA: Diagnosis not present

## 2017-04-06 DIAGNOSIS — Z6837 Body mass index (BMI) 37.0-37.9, adult: Secondary | ICD-10-CM | POA: Diagnosis not present

## 2017-04-21 DIAGNOSIS — J069 Acute upper respiratory infection, unspecified: Secondary | ICD-10-CM | POA: Diagnosis not present

## 2017-04-21 DIAGNOSIS — Z6837 Body mass index (BMI) 37.0-37.9, adult: Secondary | ICD-10-CM | POA: Diagnosis not present

## 2017-04-23 ENCOUNTER — Other Ambulatory Visit (HOSPITAL_COMMUNITY): Payer: Self-pay | Admitting: Family Medicine

## 2017-04-23 ENCOUNTER — Ambulatory Visit (HOSPITAL_COMMUNITY)
Admission: RE | Admit: 2017-04-23 | Discharge: 2017-04-23 | Disposition: A | Discharge status: Home / Self Care | Payer: Medicare HMO | Source: Ambulatory Visit | Attending: Family Medicine | Admitting: Family Medicine

## 2017-04-23 DIAGNOSIS — R05 Cough: Secondary | ICD-10-CM | POA: Diagnosis not present

## 2017-04-23 DIAGNOSIS — R079 Chest pain, unspecified: Secondary | ICD-10-CM | POA: Diagnosis not present

## 2017-04-23 DIAGNOSIS — I517 Cardiomegaly: Secondary | ICD-10-CM | POA: Insufficient documentation

## 2017-04-23 DIAGNOSIS — J069 Acute upper respiratory infection, unspecified: Secondary | ICD-10-CM

## 2017-04-23 DIAGNOSIS — Z6837 Body mass index (BMI) 37.0-37.9, adult: Secondary | ICD-10-CM | POA: Insufficient documentation

## 2017-05-01 ENCOUNTER — Other Ambulatory Visit: Payer: Self-pay | Admitting: Family Medicine

## 2017-05-03 ENCOUNTER — Encounter (HOSPITAL_COMMUNITY): Payer: Self-pay | Admitting: Emergency Medicine

## 2017-05-03 ENCOUNTER — Emergency Department (HOSPITAL_COMMUNITY)
Admission: EM | Admit: 2017-05-03 | Discharge: 2017-05-03 | Disposition: A | Discharge status: Home / Self Care | Payer: Medicare HMO | Attending: Emergency Medicine | Admitting: Emergency Medicine

## 2017-05-03 ENCOUNTER — Other Ambulatory Visit: Payer: Self-pay

## 2017-05-03 ENCOUNTER — Emergency Department (HOSPITAL_COMMUNITY): Payer: Medicare HMO

## 2017-05-03 DIAGNOSIS — R05 Cough: Secondary | ICD-10-CM | POA: Diagnosis not present

## 2017-05-03 DIAGNOSIS — Z794 Long term (current) use of insulin: Secondary | ICD-10-CM | POA: Insufficient documentation

## 2017-05-03 DIAGNOSIS — Z79899 Other long term (current) drug therapy: Secondary | ICD-10-CM | POA: Diagnosis not present

## 2017-05-03 DIAGNOSIS — E119 Type 2 diabetes mellitus without complications: Secondary | ICD-10-CM | POA: Insufficient documentation

## 2017-05-03 DIAGNOSIS — I10 Essential (primary) hypertension: Secondary | ICD-10-CM | POA: Diagnosis not present

## 2017-05-03 DIAGNOSIS — J40 Bronchitis, not specified as acute or chronic: Secondary | ICD-10-CM | POA: Diagnosis not present

## 2017-05-03 DIAGNOSIS — E039 Hypothyroidism, unspecified: Secondary | ICD-10-CM | POA: Insufficient documentation

## 2017-05-03 LAB — COMPREHENSIVE METABOLIC PANEL
ALT: 42 U/L (ref 14–54)
AST: 37 U/L (ref 15–41)
Albumin: 3.1 g/dL — ABNORMAL LOW (ref 3.5–5.0)
Alkaline Phosphatase: 119 U/L (ref 38–126)
Anion gap: 10 (ref 5–15)
BUN: 17 mg/dL (ref 6–20)
CO2: 23 mmol/L (ref 22–32)
Calcium: 9.3 mg/dL (ref 8.9–10.3)
Chloride: 111 mmol/L (ref 101–111)
Creatinine, Ser: 1.06 mg/dL — ABNORMAL HIGH (ref 0.44–1.00)
GFR calc Af Amer: 59 mL/min — ABNORMAL LOW (ref >60)
GFR calc non Af Amer: 51 mL/min — ABNORMAL LOW (ref >60)
Glucose, Bld: 195 mg/dL — ABNORMAL HIGH (ref 65–99)
Potassium: 3.3 mmol/L — ABNORMAL LOW (ref 3.5–5.1)
Sodium: 144 mmol/L (ref 135–145)
Total Bilirubin: 0.2 mg/dL — ABNORMAL LOW (ref 0.3–1.2)
Total Protein: 6.6 g/dL (ref 6.5–8.1)

## 2017-05-03 LAB — CBC WITH DIFFERENTIAL/PLATELET
Basophils Absolute: 0.1 10*3/uL (ref 0.0–0.1)
Basophils Relative: 1 %
Eosinophils Absolute: 0.1 10*3/uL (ref 0.0–0.7)
Eosinophils Relative: 1 %
HCT: 39.5 % (ref 36.0–46.0)
Hemoglobin: 12.4 g/dL (ref 12.0–15.0)
Lymphocytes Relative: 16 %
Lymphs Abs: 1.3 10*3/uL (ref 0.7–4.0)
MCH: 27 pg (ref 26.0–34.0)
MCHC: 31.4 g/dL (ref 30.0–36.0)
MCV: 85.9 fL (ref 78.0–100.0)
Monocytes Absolute: 0.9 10*3/uL (ref 0.1–1.0)
Monocytes Relative: 12 %
Neutro Abs: 5.7 10*3/uL (ref 1.7–7.7)
Neutrophils Relative %: 70 %
Platelets: 296 10*3/uL (ref 150–400)
RBC: 4.6 MIL/uL (ref 3.87–5.11)
RDW: 15.6 % — ABNORMAL HIGH (ref 11.5–15.5)
WBC: 8 10*3/uL (ref 4.0–10.5)

## 2017-05-03 LAB — URINALYSIS, ROUTINE W REFLEX MICROSCOPIC
Bilirubin Urine: NEGATIVE
Glucose, UA: 150 mg/dL — AB
Hgb urine dipstick: NEGATIVE
Ketones, ur: NEGATIVE mg/dL
Nitrite: NEGATIVE
Protein, ur: >=300 mg/dL — AB
Specific Gravity, Urine: 1.023 (ref 1.005–1.030)
Trans Epithel, UA: 2
pH: 5 (ref 5.0–8.0)

## 2017-05-03 LAB — TROPONIN I: Troponin I: <0.03 ng/mL (ref <0.03)

## 2017-05-03 LAB — I-STAT TROPONIN, ED: Troponin i, poc: 0.01 ng/mL (ref 0.00–0.08)

## 2017-05-03 MED ORDER — METHYLPREDNISOLONE SODIUM SUCC 125 MG IJ SOLR
80.0000 mg | Freq: Once | INTRAMUSCULAR | Status: AC
  Administered 2017-05-03: 80 mg via INTRAVENOUS
  Filled 2017-05-03: qty 2

## 2017-05-03 MED ORDER — IPRATROPIUM BROMIDE 0.02 % IN SOLN
0.5000 mg | Freq: Once | RESPIRATORY_TRACT | Status: AC
  Administered 2017-05-03: 0.5 mg via RESPIRATORY_TRACT
  Filled 2017-05-03: qty 2.5

## 2017-05-03 MED ORDER — SODIUM CHLORIDE 0.9 % IV BOLUS (SEPSIS)
1000.0000 mL | Freq: Once | INTRAVENOUS | Status: AC
  Administered 2017-05-03: 1000 mL via INTRAVENOUS

## 2017-05-03 MED ORDER — AZITHROMYCIN 250 MG PO TABS
500.0000 mg | ORAL_TABLET | Freq: Once | ORAL | Status: AC
  Administered 2017-05-03: 500 mg via ORAL
  Filled 2017-05-03: qty 2

## 2017-05-03 MED ORDER — PREDNISONE 10 MG PO TABS: 10.0000 mg | ORAL_TABLET | Freq: Every day | ORAL | 0 refills | Status: DC

## 2017-05-03 MED ORDER — AZITHROMYCIN 250 MG PO TABS: ORAL_TABLET | ORAL | 0 refills | Status: DC

## 2017-05-03 NOTE — ED Notes (Signed)
Dr C in to assess 

## 2017-05-03 NOTE — ED Notes (Signed)
From Rad 

## 2017-05-03 NOTE — Discharge Instructions (Signed)
Prescription for antibiotic and prednisone.  Increase fluids.  Use your inhaler as needed.  Take over-the-counter cough syrup.  Tylenol for fever.  Follow-up with your primary care doctor this week.  The upper number of your blood pressure was up today.

## 2017-05-03 NOTE — ED Provider Notes (Signed)
[facility] EMERGENCY DEPARTMENT Provider Note   CSN: [id number] Arrival date & time: 05/03/17  1149     History   Chief Complaint Chief Complaint  Patient presents with  . Cough    HPI Holly Baldwin is a 72 y.o. female.  Patient has had a persistent productive cough since January of this year.  She has seen her primary care doctor (Dr Hilma Favors) for this and was given "shots".  She is unsure what they were.  She has used her inhaler x2 today.  She states her abdominal wall muscles are sore from coughing.  No substernal chest pain, dyspnea, fever, sweats, chills, dysuria.  She is able to do her normal ADLs at the house.  Severity of symptoms is mild to moderate.  Nothing makes symptoms better or worse.      Past Medical History:  Diagnosis Date  . Anxiety   . Chronic abdominal pain   . Diabetes mellitus   . GERD (gastroesophageal reflux disease)   . Hiatal hernia   . Hypercholesteremia   . Hypertension     Patient Active Problem List   Diagnosis Date Noted  . Vitamin D deficiency 03/17/2017  . Class 2 severe obesity due to excess calories with serious comorbidity and body mass index (BMI) of 38.0 to 38.9 in adult ([place]) 07/10/2015  . Essential hypertension, benign 01/02/2015  . Hyperlipidemia 01/02/2015  . Unspecified gastritis and gastroduodenitis without mention of hemorrhage 05/06/2012  . Abdominal pain 05/05/2012  . Nausea & vomiting 05/05/2012  . Depression 05/05/2012  . GERD (gastroesophageal reflux disease) 05/05/2012  . Uncontrolled type 2 diabetes mellitus with complication, with long-term current use of insulin ([place]) 05/05/2012  . Primary hypothyroidism 05/05/2012  . Hiatal hernia 05/05/2012  . SHOULDER PAIN 03/25/2007  . IMPINGEMENT SYNDROME 03/25/2007  . RUPTURE ROTATOR CUFF 03/25/2007  . HIGH BLOOD PRESSURE 03/25/2007    Past Surgical History:  Procedure Laterality Date  . ABDOMINAL HYSTERECTOMY    . COLONOSCOPY N/A 06/23/2012   Procedure:  COLONOSCOPY;  Surgeon: Rogene Houston, MD;  Location: [facility] SU[facility];  Service: Endoscopy;  Laterality: N/A;  100-moved to 1200 Ann to notify pt  . ESOPHAGOGASTRODUODENOSCOPY N/A 05/06/2012   Procedure: ESOPHAGOGASTRODUODENOSCOPY (EGD);  Surgeon: Rogene Houston, MD;  Location: [facility];  Service: Endoscopy;  Laterality: N/A;  . MASS EXCISION Right 05/26/2012   Procedure: EXCISION NEOPLASM RIGHT THIGH;  Surgeon: Jamesetta So, MD;  Location: [facility] ORS;  Service: General;  Laterality: Right;    OB History    No data available       Home Medications    Prior to Admission medications   Medication Sig Start Date End Date Taking? Authorizing Provider  albuterol (PROVENTIL HFA;VENTOLIN HFA) 108 (90 Base) MCG/ACT inhaler Inhale into the lungs every 6 (six) hours as needed for wheezing or shortness of breath.   Yes [provider]  amLODipine-olmesartan (AZOR) 10-40 MG tablet Take 1 tablet by mouth daily. 05/01/17  Yes [provider]  aspirin EC 81 MG tablet Take 81 mg by mouth at bedtime.   Yes [provider]  Blood Glucose Monitoring Suppl (ACCU-CHEK AVIVA PLUS) w/Device KIT TEST FOUR TIMES DAILY 03/25/17  Yes Nida, Marella Chimes, MD  budesonide-formoterol (SYMBICORT) 160-4.5 MCG/ACT inhaler Inhale 2 puffs into the lungs 2 (two) times daily.   Yes [provider]  capsaicin (ZOSTRIX) 0.025 % cream Apply topically 2 (two) times daily.   Yes [provider]  Cholecalciferol (VITAMIN D3) 5000  units CAPS Take 1 capsule (5,000 Units total) by mouth daily. 03/17/17  Yes Nida, Marella Chimes, MD  cloNIDine (CATAPRES) 0.2 MG tablet Take 0.2 mg by mouth 2 (two) times daily.    Yes [provider]  cycloSPORINE (RESTASIS) 0.05 % ophthalmic emulsion Place 1 drop into both eyes daily.   Yes [provider]  EPINEPHrine (EPI-PEN) 0.3 mg/0.3 mL DEVI Inject 0.3 mg into the muscle once.   Yes [provider]  FLUoxetine (PROZAC) 20 MG  capsule Take 20 mg by mouth every morning.    Yes [provider]  gabapentin (NEURONTIN) 100 MG capsule Take 1 capsule (100 mg total) by mouth 2 (two) times daily as needed. Patient taking differently: Take 300 mg by mouth daily.  08/06/16  Yes Cassandria Anger, MD  Insulin Glargine (LANTUS SOLOSTAR) 100 UNIT/ML Solostar Pen INJECT  50 UNITS SUBCUTANEOUSLY  AT  10PM 03/17/17  Yes Nida, Marella Chimes, MD  levothyroxine (SYNTHROID, LEVOTHROID) 137 MCG tablet TAKE 1 TABLET EVERY DAY BEFORE BREAKFAST 03/16/17  Yes Nida, Marella Chimes, MD  metFORMIN (GLUCOPHAGE) 500 MG tablet TAKE 1 TABLET TWICE DAILY 12/10/16  Yes Fayrene Helper, MD  NOVOLOG FLEXPEN 100 UNIT/ML FlexPen INJECT 15-21 UNITS INTO THE SKIN THREE TIMES DAILY WITH MEALS. 02/19/17  Yes Cassandria Anger, MD  simvastatin (ZOCOR) 40 MG tablet TAKE 1 TABLET EVERY DAY 03/26/17  Yes Fayrene Helper, MD  topiramate (TOPAMAX) 100 MG tablet Take 1 tablet by mouth 2 (two) times daily. 04/08/17  Yes [provider]  ziprasidone (GEODON) 80 MG capsule Take 80 mg by mouth at bedtime.     Yes [provider]  zolpidem (AMBIEN) 5 MG tablet Take 5 mg by mouth at bedtime as needed for sleep.   Yes [provider]  azithromycin (ZITHROMAX Z-PAK) 250 MG tablet 1 tablet daily starting Monday afternoon for 4 more days 05/03/17   Nat Christen, MD  predniSONE (DELTASONE) 10 MG tablet Take 1 tablet (10 mg total) by mouth daily with breakfast. 3 tablets for 3 days, 2 tablets for 3 days, 1 tablet for 3 days 05/03/17   Nat Christen, MD    Family History Family History  Problem Relation Age of Onset  . Diabetes Mother   . Diabetes Father   . Diabetes Sister   . Diabetes Brother   . Colon cancer Neg Hx   . Colon polyps Neg Hx     Social History Social History   Tobacco Use  . Smoking status: Never Smoker  . Smokeless tobacco: Never Used  Substance Use Topics  . Alcohol use: No  . Drug use: No      Allergies   Bee venom; Ace inhibitors; and Penicillins   Review of Systems Review of Systems  All other systems reviewed and are negative.    Physical Exam Updated Vital Signs BP (!) 214/136   Pulse 67   Temp 99 F (37.2 C) (Oral)   Resp 11   Ht '5\' 4"'  (1.626 m)   Wt 100.2 kg (221 lb)   SpO2 96%   BMI 37.93 kg/m   Physical Exam  Constitutional: She is oriented to person, place, and time. She appears well-developed and well-nourished.  HENT:  Head: Normocephalic and atraumatic.  Eyes: Conjunctivae are normal.  Neck: Neck supple.  Cardiovascular: Normal rate.  Patient was initially in intermittent bigeminy.  Pulmonary/Chest: Effort normal and breath sounds normal.  Abdominal: Soft. Bowel sounds are normal.  Musculoskeletal: Normal range of  motion.  Neurological: She is alert and oriented to person, place, and time.  Skin: Skin is warm and dry.  Psychiatric: She has a normal mood and affect. Her behavior is normal.  Nursing note and vitals reviewed.    ED Treatments / Results  Labs (all labs ordered are listed, but only abnormal results are displayed) Labs Reviewed  CBC WITH DIFFERENTIAL/PLATELET - Abnormal; Notable for the following components:      Result Value   RDW 15.6 (*)    All other components within normal limits  COMPREHENSIVE METABOLIC PANEL - Abnormal; Notable for the following components:   Potassium 3.3 (*)    Glucose, Bld 195 (*)    Creatinine, Ser 1.06 (*)    Albumin 3.1 (*)    Total Bilirubin 0.2 (*)    GFR calc non Af Amer 51 (*)    GFR calc Af Amer 59 (*)    All other components within normal limits  URINALYSIS, ROUTINE W REFLEX MICROSCOPIC - Abnormal; Notable for the following components:   APPearance HAZY (*)    Glucose, UA 150 (*)    Protein, ur >=300 (*)    Leukocytes, UA TRACE (*)    Bacteria, UA RARE (*)    Squamous Epithelial / LPF 6-30 (*)    All other components within normal limits  TROPONIN I  I-STAT TROPONIN, ED     EKG  EKG Interpretation  Date/Time:  Sunday May 03 2017 12:08:23 EST Ventricular Rate:  89 PR Interval:    QRS Duration: 93 QT Interval:  421 QTC Calculation: 410 R Axis:   53 Text Interpretation:  Sinus rhythm Ventricular bigeminy Low voltage, precordial leads ST elevation, consider inferior injury Confirmed by Nat Christen [phone]) on 05/03/2017 1:41:15 PM       Radiology Dg Chest 2 View  Result Date: 05/03/2017 CLINICAL DATA:  Productive cough, green sputum EXAM: CHEST  2 VIEW COMPARISON:  04/23/2017 FINDINGS: The heart size and mediastinal contours are within normal limits. Both lungs are clear. The visualized skeletal structures are unremarkable. IMPRESSION: No active cardiopulmonary disease. Electronically Signed   By: Kathreen Devoid   On: 05/03/2017 12:51    Procedures Procedures (including critical care time)  Medications Ordered in ED Medications  sodium chloride 0.9 % bolus 1,000 mL (1,000 mLs Intravenous New Bag/Given 05/03/17 1245)  methylPREDNISolone sodium succinate (SOLU-MEDROL) 125 mg/2 mL injection 80 mg (80 mg Intravenous Given 05/03/17 1246)  ipratropium (ATROVENT) nebulizer solution 0.5 mg (0.5 mg Nebulization Given 05/03/17 1256)  azithromycin (ZITHROMAX) tablet 500 mg (500 mg Oral Given 05/03/17 1423)     Initial Impression / Assessment and Plan / ED Course  I have reviewed the triage vital signs and the nursing notes.  Pertinent labs & imaging results that were available during my care of the patient were reviewed by me and considered in my medical decision making (see chart for details).    Patient presents with persistent URI symptoms.  Her systolic blood pressure is elevated.  She was initially in bigeminy intermittently, but this improved with oxygenation and a nebulizer treatment.  She was hemodynamically stable.  Chest x-ray showed no obvious pneumonia.  Labs were reassuring including troponin.  IV Solu-Medrol and Atrovent nebulizer treatment given.  Will  start antibiotics secondary to longevity of symptoms and her immunocompromise state.  Discharge medications Zithromax and prednisone.  She has primary care follow-up.   Final Clinical Impressions(s) / ED Diagnoses   Final diagnoses:  Bronchitis    ED Discharge  Orders        Ordered    azithromycin (ZITHROMAX Z-PAK) 250 MG tablet     05/03/17 1433    predniSONE (DELTASONE) 10 MG tablet  Daily with breakfast     05/03/17 1433       Nat Christen, MD 05/03/17 1452

## 2017-05-03 NOTE — ED Notes (Signed)
Pt reports she has been sick since 1/19- has had several PCP visits with shots in her hip Presents today for cough Pt roomed with irregular pulse - bigeminy on monitor with O2 [place] 2L and conversion to SR with less frequent runs of bigeminy Dr C in to assess V est  Orders recieved To Rad

## 2017-05-03 NOTE — ED Notes (Signed)
Pt reports that she has taken her BP meds this morning

## 2017-05-03 NOTE — ED Triage Notes (Addendum)
Pt c/o productive cough with green sputum since January. Reports body soreness due to coughing. Denies fevers. HR fluctuates between 39 - 75 in triage.

## 2017-05-25 ENCOUNTER — Other Ambulatory Visit: Payer: Self-pay | Admitting: "Endocrinology

## 2017-06-04 DIAGNOSIS — N390 Urinary tract infection, site not specified: Secondary | ICD-10-CM | POA: Diagnosis not present

## 2017-06-04 DIAGNOSIS — J069 Acute upper respiratory infection, unspecified: Secondary | ICD-10-CM | POA: Diagnosis not present

## 2017-06-04 DIAGNOSIS — Z6836 Body mass index (BMI) 36.0-36.9, adult: Secondary | ICD-10-CM | POA: Diagnosis not present

## 2017-06-04 DIAGNOSIS — Z1389 Encounter for screening for other disorder: Secondary | ICD-10-CM | POA: Diagnosis not present

## 2017-06-04 DIAGNOSIS — E6609 Other obesity due to excess calories: Secondary | ICD-10-CM | POA: Diagnosis not present

## 2017-06-04 DIAGNOSIS — J45909 Unspecified asthma, uncomplicated: Secondary | ICD-10-CM | POA: Diagnosis not present

## 2017-06-15 DIAGNOSIS — E118 Type 2 diabetes mellitus with unspecified complications: Secondary | ICD-10-CM | POA: Diagnosis not present

## 2017-06-15 DIAGNOSIS — E1165 Type 2 diabetes mellitus with hyperglycemia: Secondary | ICD-10-CM | POA: Diagnosis not present

## 2017-06-15 DIAGNOSIS — E039 Hypothyroidism, unspecified: Secondary | ICD-10-CM | POA: Diagnosis not present

## 2017-06-15 DIAGNOSIS — Z794 Long term (current) use of insulin: Secondary | ICD-10-CM | POA: Diagnosis not present

## 2017-06-16 LAB — COMPLETE METABOLIC PANEL WITH GFR
AG Ratio: 1.4 (calc) (ref 1.0–2.5)
ALT: 16 U/L (ref 6–29)
AST: 15 U/L (ref 10–35)
Albumin: 3.4 g/dL — ABNORMAL LOW (ref 3.6–5.1)
Alkaline phosphatase (APISO): 123 U/L (ref 33–130)
BUN/Creatinine Ratio: 13 (calc) (ref 6–22)
BUN: 16 mg/dL (ref 7–25)
CO2: 29 mmol/L (ref 20–32)
Calcium: 8.9 mg/dL (ref 8.6–10.4)
Chloride: 108 mmol/L (ref 98–110)
Creat: 1.25 mg/dL — ABNORMAL HIGH (ref 0.60–0.93)
GFR, Est African American: 49 mL/min/{1.73_m2} — ABNORMAL LOW (ref >=60)
GFR, Est Non African American: 43 mL/min/{1.73_m2} — ABNORMAL LOW (ref >=60)
Globulin: 2.4 g/dL (calc) (ref 1.9–3.7)
Glucose, Bld: 222 mg/dL — ABNORMAL HIGH (ref 65–99)
Potassium: 3.8 mmol/L (ref 3.5–5.3)
Sodium: 144 mmol/L (ref 135–146)
Total Bilirubin: 0.3 mg/dL (ref 0.2–1.2)
Total Protein: 5.8 g/dL — ABNORMAL LOW (ref 6.1–8.1)

## 2017-06-16 LAB — HEMOGLOBIN A1C
Hgb A1c MFr Bld: 7.3 % of total Hgb — ABNORMAL HIGH (ref <5.7)
Mean Plasma Glucose: 163 (calc)
eAG (mmol/L): 9 (calc)

## 2017-06-16 LAB — TSH: TSH: 2.2 mIU/L (ref 0.40–4.50)

## 2017-06-16 LAB — T4, FREE: Free T4: 1.2 ng/dL (ref 0.8–1.8)

## 2017-06-23 ENCOUNTER — Encounter: Payer: Self-pay | Admitting: "Endocrinology

## 2017-06-23 ENCOUNTER — Ambulatory Visit (INDEPENDENT_AMBULATORY_CARE_PROVIDER_SITE_OTHER): Payer: Medicare HMO | Admitting: "Endocrinology

## 2017-06-23 VITALS — BP 165/75 | HR 81 | Ht 64.0 in | Wt 223.0 lb

## 2017-06-23 DIAGNOSIS — I10 Essential (primary) hypertension: Secondary | ICD-10-CM | POA: Diagnosis not present

## 2017-06-23 DIAGNOSIS — E039 Hypothyroidism, unspecified: Secondary | ICD-10-CM

## 2017-06-23 DIAGNOSIS — E118 Type 2 diabetes mellitus with unspecified complications: Secondary | ICD-10-CM

## 2017-06-23 DIAGNOSIS — IMO0002 Reserved for concepts with insufficient information to code with codable children: Secondary | ICD-10-CM

## 2017-06-23 DIAGNOSIS — Z794 Long term (current) use of insulin: Secondary | ICD-10-CM | POA: Diagnosis not present

## 2017-06-23 DIAGNOSIS — E1165 Type 2 diabetes mellitus with hyperglycemia: Secondary | ICD-10-CM | POA: Diagnosis not present

## 2017-06-23 DIAGNOSIS — Z6838 Body mass index (BMI) 38.0-38.9, adult: Secondary | ICD-10-CM

## 2017-06-23 MED ORDER — EVOLOCUMAB 140 MG/ML ~~LOC~~ SOAJ
140.0000 mg | SUBCUTANEOUS | 2 refills | Status: DC
Start: 2017-06-23 — End: 2017-09-22

## 2017-06-23 NOTE — Progress Notes (Signed)
Subjective:    Patient ID: Holly Baldwin, female    DOB: 28-Jul-1945,    Past Medical History:  Diagnosis Date  . Anxiety   . Chronic abdominal pain   . Diabetes mellitus   . GERD (gastroesophageal reflux disease)   . Hiatal hernia   . Hypercholesteremia   . Hypertension    Past Surgical History:  Procedure Laterality Date  . ABDOMINAL HYSTERECTOMY    . COLONOSCOPY N/A 06/23/2012   Procedure: COLONOSCOPY;  Surgeon: Rogene Houston, MD;  Location: [facility];  Service: Endoscopy;  Laterality: N/A;  100-moved to 1200 Ann to notify pt  . ESOPHAGOGASTRODUODENOSCOPY N/A 05/06/2012   Procedure: ESOPHAGOGASTRODUODENOSCOPY (EGD);  Surgeon: Rogene Houston, MD;  Location: [facility];  Service: Endoscopy;  Laterality: N/A;  . MASS EXCISION Right 05/26/2012   Procedure: EXCISION NEOPLASM RIGHT THIGH;  Surgeon: Jamesetta So, MD;  Location: [facility];  Service: General;  Laterality: Right;   Social History   Socioeconomic History  . Marital status: Married    Spouse name: Not on file  . Number of children: Not on file  . Years of education: Not on file  . Highest education level: Not on file  Occupational History  . Not on file  Social Needs  . Financial resource strain: Not on file  . Food insecurity:    Worry: Not on file    Inability: Not on file  . Transportation needs:    Medical: Not on file    Non-medical: Not on file  Tobacco Use  . Smoking status: Never Smoker  . Smokeless tobacco: Never Used  Substance and Sexual Activity  . Alcohol use: No  . Drug use: No  . Sexual activity: Not on file  Lifestyle  . Physical activity:    Days per week: Not on file    Minutes per session: Not on file  . Stress: Not on file  Relationships  . Social connections:    Talks on phone: Not on file    Gets together: Not on file    Attends religious service: Not on file    Active member of club or organization: Not on file    Attends meetings of clubs or organizations: Not on  file    Relationship status: Not on file  Other Topics Concern  . Not on file  Social History Narrative  . Not on file   Outpatient Encounter Medications as of 06/23/2017  Medication Sig  . albuterol (PROVENTIL HFA;VENTOLIN HFA) 108 (90 Base) MCG/ACT inhaler Inhale into the lungs every 6 (six) hours as needed for wheezing or shortness of breath.  Marland Kitchen amLODipine-olmesartan (AZOR) 10-40 MG tablet Take 1 tablet by mouth daily.  Marland Kitchen aspirin EC 81 MG tablet Take 81 mg by mouth at bedtime.  . Blood Glucose Monitoring Suppl (ACCU-CHEK AVIVA PLUS) w/Device KIT TEST FOUR TIMES DAILY  . budesonide-formoterol (SYMBICORT) 160-4.5 MCG/ACT inhaler Inhale 2 puffs into the lungs 2 (two) times daily.  . capsaicin (ZOSTRIX) 0.025 % cream Apply topically 2 (two) times daily.  . Cholecalciferol (VITAMIN D3) 5000 units CAPS Take 1 capsule (5,000 Units total) by mouth daily.  . cloNIDine (CATAPRES) 0.2 MG tablet Take 0.2 mg by mouth 2 (two) times daily.   . cycloSPORINE (RESTASIS) 0.05 % ophthalmic emulsion Place 1 drop into both eyes daily.  Marland Kitchen EPINEPHrine (EPI-PEN) 0.3 mg/0.3 mL DEVI Inject 0.3 mg into the muscle once.  . Evolocumab (REPATHA SURECLICK) 327 MG/ML SOAJ Inject 140  mg into the skin every 14 (fourteen) days.  Marland Kitchen FLUoxetine (PROZAC) 20 MG capsule Take 20 mg by mouth every morning.   . gabapentin (NEURONTIN) 100 MG capsule Take 1 capsule (100 mg total) by mouth 2 (two) times daily as needed. (Patient taking differently: Take 300 mg by mouth daily. )  . Insulin Glargine (LANTUS SOLOSTAR) 100 UNIT/ML Solostar Pen INJECT  50 UNITS SUBCUTANEOUSLY  AT  10PM  . levothyroxine (SYNTHROID, LEVOTHROID) 137 MCG tablet TAKE 1 TABLET EVERY DAY BEFORE BREAKFAST  . metFORMIN (GLUCOPHAGE) 500 MG tablet TAKE 1 TABLET TWICE DAILY  . NOVOLOG FLEXPEN 100 UNIT/ML FlexPen INJECT 15 TO 21 UNITS INTO THE SKIN THREE TIMES DAILY WITH MEALS.  . simvastatin (ZOCOR) 40 MG tablet TAKE 1 TABLET EVERY DAY  . topiramate (TOPAMAX) 100 MG  tablet Take 1 tablet by mouth 2 (two) times daily.  . ziprasidone (GEODON) 80 MG capsule Take 80 mg by mouth at bedtime.    Marland Kitchen zolpidem (AMBIEN) 5 MG tablet Take 5 mg by mouth at bedtime as needed for sleep.  . [DISCONTINUED] azithromycin (ZITHROMAX Z-PAK) 250 MG tablet 1 tablet daily starting Monday afternoon for 4 more days  . [DISCONTINUED] predniSONE (DELTASONE) 10 MG tablet Take 1 tablet (10 mg total) by mouth daily with breakfast. 3 tablets for 3 days, 2 tablets for 3 days, 1 tablet for 3 days   No facility-administered encounter medications on file as of 06/23/2017.    ALLERGIES: Allergies  Allergen Reactions  . Bee Venom Anaphylaxis  . Ace Inhibitors   . Penicillins Hives    .Has patient had a PCN reaction causing immediate rash, facial/tongue/throat swelling, SOB or lightheadedness with hypotension: Yes Has patient had a PCN reaction causing severe rash involving mucus membranes or skin necrosis: No Has patient had a PCN reaction that required hospitalization: Yes Has patient had a PCN reaction occurring within the last 10 years: No If all of the above answers are "NO", then may proceed with Cephalosporin use.    VACCINATION STATUS:  There is no immunization history on file for this patient.  Diabetes  She presents for her follow-up diabetic visit. She has type 2 diabetes mellitus. Onset time: she was diagnosed at approximate age of 81 years. Her disease course has been stable. There are no hypoglycemic associated symptoms. Pertinent negatives for hypoglycemia include no confusion, headaches, pallor or seizures. Associated symptoms include polydipsia. Pertinent negatives for diabetes include no chest pain, no fatigue, no polyphagia and no polyuria. There are no hypoglycemic complications. Symptoms are stable. Diabetic complications include autonomic neuropathy. Risk factors for coronary artery disease include diabetes mellitus, dyslipidemia, obesity and sedentary lifestyle. Her  weight is stable. She is following a generally unhealthy diet. She has not had a previous visit with a dietitian. She never participates in exercise. Her home blood glucose trend is decreasing steadily. Her breakfast blood glucose range is generally 130-140 mg/dl. Her lunch blood glucose range is generally 130-140 mg/dl. Her dinner blood glucose range is generally 130-140 mg/dl. Her bedtime blood glucose range is generally 130-140 mg/dl. Her overall blood glucose range is 130-140 mg/dl. An ACE inhibitor/angiotensin II receptor blocker is being taken.  Thyroid Problem  Presents for follow-up (She is currently on levothyroxine 137 mcg p.o. nightly.  She reports compliance with medication.) visit. Patient reports no cold intolerance, diarrhea, fatigue, heat intolerance or palpitations. The symptoms have been improving. Past treatments include levothyroxine. Her past medical history is significant for diabetes and hyperlipidemia.  Hyperlipidemia  This is  a chronic problem. The current episode started more than 1 year ago. Exacerbating diseases include diabetes and obesity. Pertinent negatives include no chest pain, myalgias or shortness of breath. Current antihyperlipidemic treatment includes statins. Risk factors for coronary artery disease include dyslipidemia, diabetes mellitus, hypertension, obesity, a sedentary lifestyle and post-menopausal.  Hypertension  This is a chronic problem. The current episode started more than 1 year ago. The problem is controlled. Pertinent negatives include no chest pain, headaches, palpitations or shortness of breath. Risk factors for coronary artery disease include dyslipidemia, diabetes mellitus, obesity, post-menopausal state and sedentary lifestyle. Past treatments include angiotensin blockers. Identifiable causes of hypertension include a thyroid problem.     Review of Systems  Constitutional: Negative for fatigue and unexpected weight change.  HENT: Negative for  trouble swallowing and voice change.   Eyes: Negative for visual disturbance.  Respiratory: Negative for cough, shortness of breath and wheezing.   Cardiovascular: Negative for chest pain, palpitations and leg swelling.  Gastrointestinal: Negative for diarrhea, nausea and vomiting.  Endocrine: Positive for polydipsia. Negative for cold intolerance, heat intolerance, polyphagia and polyuria.  Musculoskeletal: Negative for arthralgias and myalgias.  Skin: Negative for color change, pallor, rash and wound.  Neurological: Negative for seizures and headaches.       She complains of bilateral feet paresthesias and tingling.  Psychiatric/Behavioral: Negative for confusion and suicidal ideas.    Objective:    BP (!) 165/75   Pulse 81   Ht '5\' 4"'  (1.626 m)   Wt 223 lb (101.2 kg)   BMI 38.28 kg/m   Wt Readings from Last 3 Encounters:  06/23/17 223 lb (101.2 kg)  05/03/17 221 lb (100.2 kg)  03/17/17 220 lb (99.8 kg)    Physical Exam  Constitutional: She is oriented to person, place, and time. She appears well-developed.  HENT:  Head: Normocephalic and atraumatic.  Eyes: EOM are normal.  Neck: Normal range of motion. Neck supple. No tracheal deviation present. No thyromegaly present.  Cardiovascular: Normal rate.  Pulmonary/Chest: Effort normal.  Abdominal: There is no tenderness. There is no guarding.  Musculoskeletal: Normal range of motion. She exhibits no edema.  Neurological: She is alert and oriented to person, place, and time. She has normal reflexes. No cranial nerve deficit. Coordination normal.  Skin: Skin is warm and dry. No rash noted. No erythema. No pallor.  Psychiatric: She has a normal mood and affect. Judgment normal.    Results for orders placed or performed during the hospital encounter of 05/03/17  CBC with Differential  Result Value Ref Range   WBC 8.0 4.0 - 10.5 K/uL   RBC 4.60 3.87 - 5.11 MIL/uL   Hemoglobin 12.4 12.0 - 15.0 g/dL   HCT 39.5 36.0 - 46.0 %    MCV 85.9 78.0 - 100.0 fL   MCH 27.0 26.0 - 34.0 pg   MCHC 31.4 30.0 - 36.0 g/dL   RDW 15.6 (H) 11.5 - 15.5 %   Platelets 296 150 - 400 K/uL   Neutrophils Relative % 70 %   Neutro Abs 5.7 1.7 - 7.7 K/uL   Lymphocytes Relative 16 %   Lymphs Abs 1.3 0.7 - 4.0 K/uL   Monocytes Relative 12 %   Monocytes Absolute 0.9 0.1 - 1.0 K/uL   Eosinophils Relative 1 %   Eosinophils Absolute 0.1 0.0 - 0.7 K/uL   Basophils Relative 1 %   Basophils Absolute 0.1 0.0 - 0.1 K/uL  Comprehensive metabolic panel  Result Value Ref Range   Sodium  144 135 - 145 mmol/L   Potassium 3.3 (L) 3.5 - 5.1 mmol/L   Chloride 111 101 - 111 mmol/L   CO2 23 22 - 32 mmol/L   Glucose, Bld 195 (H) 65 - 99 mg/dL   BUN 17 6 - 20 mg/dL   Creatinine, Ser 1.06 (H) 0.44 - 1.00 mg/dL   Calcium 9.3 8.9 - 10.3 mg/dL   Total Protein 6.6 6.5 - 8.1 g/dL   Albumin 3.1 (L) 3.5 - 5.0 g/dL   AST 37 15 - 41 U/L   ALT 42 14 - 54 U/L   Alkaline Phosphatase 119 38 - 126 U/L   Total Bilirubin 0.2 (L) 0.3 - 1.2 mg/dL   GFR calc non Af Amer 51 (L) >60 mL/min   GFR calc Af Amer 59 (L) >60 mL/min   Anion gap 10 5 - 15  Troponin I  Result Value Ref Range   Troponin I <0.03 <0.03 ng/mL  Urinalysis, Routine w reflex microscopic  Result Value Ref Range   Color, Urine YELLOW YELLOW   APPearance HAZY (A) CLEAR   Specific Gravity, Urine 1.023 1.005 - 1.030   pH 5.0 5.0 - 8.0   Glucose, UA 150 (A) NEGATIVE mg/dL   Hgb urine dipstick NEGATIVE NEGATIVE   Bilirubin Urine NEGATIVE NEGATIVE   Ketones, ur NEGATIVE NEGATIVE mg/dL   Protein, ur >=300 (A) NEGATIVE mg/dL   Nitrite NEGATIVE NEGATIVE   Leukocytes, UA TRACE (A) NEGATIVE   RBC / HPF 0-5 0 - 5 RBC/hpf   WBC, UA 6-30 0 - 5 WBC/hpf   Bacteria, UA RARE (A) NONE SEEN   Squamous Epithelial / LPF 6-30 (A) NONE SEEN   Trans Epithel, UA 2    Mucus PRESENT   I-stat troponin, ED  Result Value Ref Range   Troponin i, poc 0.01 0.00 - 0.08 ng/mL   Comment 3           Diabetic Labs (most  recent): Lab Results  Component Value Date   HGBA1C 7.3 (H) 06/15/2017   HGBA1C 7.3 (H) 03/09/2017   HGBA1C 7.0 (H) 10/30/2016   Lipid Panel     Component Value Date/Time   CHOL 218 (H) 03/09/2017 1019   TRIG 167 (H) 03/09/2017 1019   HDL 63 03/09/2017 1019   CHOLHDL 3.5 03/09/2017 1019   LDLCALC 127 (H) 03/09/2017 1019     Assessment & Plan:   1. Uncontrolled type 2 diabetes mellitus with complication, with long-term current use of insulin ([place])  Her diabetes is  complicated by neuropathy and patient remains at a high risk for more acute and chronic complications of diabetes which include CAD, CVA, CKD, retinopathy, and neuropathy. These are all discussed in detail with the patient.  She returns with stable A1c of 7.3%, generally improving from 9%.    Glucose logs and insulin administration records pertaining to this visit,  to be scanned into patient's records.  She made insulin dosing errors. Recent labs reviewed.   - I have re-counseled the patient on diet management and weight loss  by adopting a carbohydrate restricted / protein rich  Diet.  -She still admits to dietary indiscretion.    -  Suggestion is made for her to avoid simple carbohydrates  from her diet including Cakes, Sweet Desserts / Pastries, Ice Cream, Soda (diet and regular), Sweet Tea, Candies, Chips, Cookies, Store Bought Juices, Alcohol in Excess of  1-2 drinks a day, Artificial Sweeteners, and "Sugar-free" Products. This will help patient to have  stable blood glucose profile and potentially avoid unintended weight gain.   - Patient is advised to stick to a routine mealtimes to eat 3 meals  a day and avoid unnecessary snacks ( to snack only to correct hypoglycemia).  - I have approached patient with the following individualized plan to manage diabetes and patient agrees.  - She still makes dosing errors of   NovoLog , taking it at random doses than prescribed and at times taking it at bedtime against  advice. I have discussed these details with her. - I advised her to to new Lantus at 50 units nightly, continue NovoLog 10 units ( only during meals)  3 times a day before meals plus correction. I discussed the errors she made dosing NovoLog .  - Continue Metformin 59m  by mouth twice a day with meals.  -  Adjustment  parameters are given for hypo and hyperglycemia in writing. -Patient is encouraged to call clinic for blood glucose levels less than 70 or above 300 mg /dl.  - Patient specific target  for A1c; LDL, HDL, Triglycerides, and  Waist Circumference were discussed in detail.  2) BP/HTN: Her blood pressure is not controlled to target.  She has enough medications including ACEI/ARB, advised to continue.  3) Lipids/HPL: Her lipid panel is uncontrolled with LDL of 127.  She is on simvastatin 40 mg p.o. nightly.  I discussed and added Repatha 140 mg subcutaneously every 2 weeks if her insurance provide coverage.    4)  Weight/Diet: CDE consult in progress, exercise, and carbohydrates information provided.  5) hypothyroidism: -Her thyroid function tests are consistent with appropriate replacement.  -I advised her to continue levothyroxine 137 mcg p.o. every morning.     - We discussed about correct intake of levothyroxine, at fasting, with water, separated by at least 30 minutes from breakfast, and separated by more than 4 hours from calcium, iron, multivitamins, acid reflux medications (PPIs). -Patient is made aware of the fact that thyroid hormone replacement is needed for life, dose to be adjusted by periodic monitoring of thyroid function tests.  6) Chronic Care/Health Maintenance:  -Patient is on ACEI/ARB and Statin medications and encouraged to continue to follow up with Ophthalmology, Podiatrist at least yearly or according to recommendations, and advised to  stay away from smoking. I have recommended yearly flu vaccine and pneumonia vaccination at least every 5 years; moderate  intensity exercise for up to 150 minutes weekly; and  sleep for at least 7 hours a day.  I advised patient to maintain close follow up with her PCP for primary care needs.  - Time spent with the patient: 25 min, of which >50% was spent in reviewing her blood glucose logs , discussing her hypo- and hyper-glycemic episodes, reviewing her current and  previous labs and insulin doses and developing a plan to avoid hypo- and hyper-glycemia. Please refer to Patient Instructions for Blood Glucose Monitoring and Insulin/Medications Dosing Guide"  in media tab for additional information. DDamien Fusiparticipated in the discussions, expressed understanding, and voiced agreement with the above plans.  All questions were answered to her satisfaction. she is encouraged to contact clinic should she have any questions or concerns prior to her return visit.  Follow up plan: Return in about 3 months (around 09/22/2017), or will return for nurse visit if Repatha is covered ., for meter, and logs, follow up with pre-visit labs, meter, and logs.  GGlade Lloyd MD Phone: [phone] Fax: [phone] This note  was partially dictated with voice recognition software. Similar sounding words can be transcribed inadequately or may not  be corrected upon review.  06/23/2017, 12:46 PM

## 2017-06-23 NOTE — Patient Instructions (Signed)

## 2017-06-25 ENCOUNTER — Encounter (INDEPENDENT_AMBULATORY_CARE_PROVIDER_SITE_OTHER): Payer: Self-pay | Admitting: *Deleted

## 2017-06-26 ENCOUNTER — Other Ambulatory Visit: Payer: Self-pay

## 2017-07-14 DIAGNOSIS — Z6837 Body mass index (BMI) 37.0-37.9, adult: Secondary | ICD-10-CM | POA: Diagnosis not present

## 2017-07-14 DIAGNOSIS — Z0001 Encounter for general adult medical examination with abnormal findings: Secondary | ICD-10-CM | POA: Diagnosis not present

## 2017-07-14 DIAGNOSIS — F0390 Unspecified dementia without behavioral disturbance: Secondary | ICD-10-CM | POA: Diagnosis not present

## 2017-07-14 DIAGNOSIS — Z1389 Encounter for screening for other disorder: Secondary | ICD-10-CM | POA: Diagnosis not present

## 2017-09-15 DIAGNOSIS — E118 Type 2 diabetes mellitus with unspecified complications: Secondary | ICD-10-CM | POA: Diagnosis not present

## 2017-09-15 DIAGNOSIS — E1165 Type 2 diabetes mellitus with hyperglycemia: Secondary | ICD-10-CM | POA: Diagnosis not present

## 2017-09-15 DIAGNOSIS — Z794 Long term (current) use of insulin: Secondary | ICD-10-CM | POA: Diagnosis not present

## 2017-09-16 LAB — HEMOGLOBIN A1C
Hgb A1c MFr Bld: 7.6 % of total Hgb — ABNORMAL HIGH (ref <5.7)
Mean Plasma Glucose: 171 (calc)
eAG (mmol/L): 9.5 (calc)

## 2017-09-16 LAB — COMPLETE METABOLIC PANEL WITH GFR
AG Ratio: 1.4 (calc) (ref 1.0–2.5)
ALT: 13 U/L (ref 6–29)
AST: 14 U/L (ref 10–35)
Albumin: 3.5 g/dL — ABNORMAL LOW (ref 3.6–5.1)
Alkaline phosphatase (APISO): 107 U/L (ref 33–130)
BUN/Creatinine Ratio: 11 (calc) (ref 6–22)
BUN: 14 mg/dL (ref 7–25)
CO2: 26 mmol/L (ref 20–32)
Calcium: 9.2 mg/dL (ref 8.6–10.4)
Chloride: 109 mmol/L (ref 98–110)
Creat: 1.27 mg/dL — ABNORMAL HIGH (ref 0.60–0.93)
GFR, Est African American: 48 mL/min/{1.73_m2} — ABNORMAL LOW (ref >=60)
GFR, Est Non African American: 42 mL/min/{1.73_m2} — ABNORMAL LOW (ref >=60)
Globulin: 2.5 g/dL (calc) (ref 1.9–3.7)
Glucose, Bld: 171 mg/dL — ABNORMAL HIGH (ref 65–99)
Potassium: 3.8 mmol/L (ref 3.5–5.3)
Sodium: 143 mmol/L (ref 135–146)
Total Bilirubin: 0.3 mg/dL (ref 0.2–1.2)
Total Protein: 6 g/dL — ABNORMAL LOW (ref 6.1–8.1)

## 2017-09-22 ENCOUNTER — Encounter: Payer: Self-pay | Admitting: "Endocrinology

## 2017-09-22 ENCOUNTER — Ambulatory Visit (INDEPENDENT_AMBULATORY_CARE_PROVIDER_SITE_OTHER): Payer: Medicare HMO | Admitting: "Endocrinology

## 2017-09-22 VITALS — BP 141/78 | HR 66 | Ht 64.0 in | Wt 223.0 lb

## 2017-09-22 DIAGNOSIS — E1165 Type 2 diabetes mellitus with hyperglycemia: Secondary | ICD-10-CM

## 2017-09-22 DIAGNOSIS — E039 Hypothyroidism, unspecified: Secondary | ICD-10-CM

## 2017-09-22 DIAGNOSIS — E782 Mixed hyperlipidemia: Secondary | ICD-10-CM | POA: Diagnosis not present

## 2017-09-22 DIAGNOSIS — IMO0002 Reserved for concepts with insufficient information to code with codable children: Secondary | ICD-10-CM

## 2017-09-22 DIAGNOSIS — Z794 Long term (current) use of insulin: Secondary | ICD-10-CM | POA: Diagnosis not present

## 2017-09-22 DIAGNOSIS — E118 Type 2 diabetes mellitus with unspecified complications: Secondary | ICD-10-CM | POA: Diagnosis not present

## 2017-09-22 DIAGNOSIS — I10 Essential (primary) hypertension: Secondary | ICD-10-CM

## 2017-09-22 NOTE — Progress Notes (Signed)
Subjective:    Patient ID: Holly Baldwin, female    DOB: [birth year],    Past Medical History:  Diagnosis Date  . Anxiety   . Chronic abdominal pain   . Diabetes mellitus   . GERD (gastroesophageal reflux disease)   . Hiatal hernia   . Hypercholesteremia   . Hypertension    Past Surgical History:  Procedure Laterality Date  . ABDOMINAL HYSTERECTOMY    . COLONOSCOPY N/A 06/23/2012   Procedure: COLONOSCOPY;  Surgeon: Rogene Houston, MD;  Location: [facility];  Service: Endoscopy;  Laterality: N/A;  100-moved to 1200 Ann to notify pt  . ESOPHAGOGASTRODUODENOSCOPY N/A 05/06/2012   Procedure: ESOPHAGOGASTRODUODENOSCOPY (EGD);  Surgeon: Rogene Houston, MD;  Location: [facility];  Service: Endoscopy;  Laterality: N/A;  . MASS EXCISION Right 05/26/2012   Procedure: EXCISION NEOPLASM RIGHT THIGH;  Surgeon: Jamesetta So, MD;  Location: [facility];  Service: General;  Laterality: Right;   Social History   Socioeconomic History  . Marital status: Married    Spouse name: Not on file  . Number of children: Not on file  . Years of education: Not on file  . Highest education level: Not on file  Occupational History  . Not on file  Social Needs  . Financial resource strain: Not on file  . Food insecurity:    Worry: Not on file    Inability: Not on file  . Transportation needs:    Medical: Not on file    Non-medical: Not on file  Tobacco Use  . Smoking status: Never Smoker  . Smokeless tobacco: Never Used  Substance and Sexual Activity  . Alcohol use: No  . Drug use: No  . Sexual activity: Not on file  Lifestyle  . Physical activity:    Days per week: Not on file    Minutes per session: Not on file  . Stress: Not on file  Relationships  . Social connections:    Talks on phone: Not on file    Gets together: Not on file    Attends religious service: Not on file    Active member of club or organization: Not on file    Attends meetings of clubs or organizations: Not on  file    Relationship status: Not on file  Other Topics Concern  . Not on file  Social History Narrative  . Not on file   Outpatient Encounter Medications as of 09/22/2017  Medication Sig  . albuterol (PROVENTIL HFA;VENTOLIN HFA) 108 (90 Base) MCG/ACT inhaler Inhale into the lungs every 6 (six) hours as needed for wheezing or shortness of breath.  Marland Kitchen amLODipine-olmesartan (AZOR) 10-40 MG tablet Take 1 tablet by mouth daily.  Marland Kitchen aspirin EC 81 MG tablet Take 81 mg by mouth at bedtime.  . Blood Glucose Monitoring Suppl (ACCU-CHEK AVIVA PLUS) w/Device KIT TEST FOUR TIMES DAILY  . budesonide-formoterol (SYMBICORT) 160-4.5 MCG/ACT inhaler Inhale 2 puffs into the lungs 2 (two) times daily.  . capsaicin (ZOSTRIX) 0.025 % cream Apply topically 2 (two) times daily.  . cloNIDine (CATAPRES) 0.2 MG tablet Take 0.2 mg by mouth 2 (two) times daily.   . cycloSPORINE (RESTASIS) 0.05 % ophthalmic emulsion Place 1 drop into both eyes daily.  Marland Kitchen EPINEPHrine (EPI-PEN) 0.3 mg/0.3 mL DEVI Inject 0.3 mg into the muscle once.  . Evolocumab (REPATHA SURECLICK) 786 MG/ML SOAJ Inject 140 mg into the skin every 14 (fourteen) days.  Marland Kitchen FLUoxetine (PROZAC) 20 MG capsule Take 20  mg by mouth every morning.   . gabapentin (NEURONTIN) 100 MG capsule Take 1 capsule (100 mg total) by mouth 2 (two) times daily as needed. (Patient taking differently: Take 300 mg by mouth daily. )  . Insulin Glargine (LANTUS SOLOSTAR) 100 UNIT/ML Solostar Pen INJECT  50 UNITS SUBCUTANEOUSLY  AT  10PM  . levothyroxine (SYNTHROID, LEVOTHROID) 137 MCG tablet TAKE 1 TABLET EVERY DAY BEFORE BREAKFAST  . metFORMIN (GLUCOPHAGE) 500 MG tablet TAKE 1 TABLET TWICE DAILY  . NOVOLOG FLEXPEN 100 UNIT/ML FlexPen INJECT 15 TO 21 UNITS INTO THE SKIN THREE TIMES DAILY WITH MEALS.  . simvastatin (ZOCOR) 40 MG tablet TAKE 1 TABLET EVERY DAY  . topiramate (TOPAMAX) 100 MG tablet Take 1 tablet by mouth 2 (two) times daily.  . ziprasidone (GEODON) 80 MG capsule Take 80 mg  by mouth at bedtime.    Marland Kitchen zolpidem (AMBIEN) 5 MG tablet Take 5 mg by mouth at bedtime as needed for sleep.  . [DISCONTINUED] Cholecalciferol (VITAMIN D3) 5000 units CAPS Take 1 capsule (5,000 Units total) by mouth daily.   No facility-administered encounter medications on file as of 09/22/2017.    ALLERGIES: Allergies  Allergen Reactions  . Bee Venom Anaphylaxis  . Ace Inhibitors   . Penicillins Hives    .Has patient had a PCN reaction causing immediate rash, facial/tongue/throat swelling, SOB or lightheadedness with hypotension: Yes Has patient had a PCN reaction causing severe rash involving mucus membranes or skin necrosis: No Has patient had a PCN reaction that required hospitalization: Yes Has patient had a PCN reaction occurring within the last 10 years: No If all of the above answers are "NO", then may proceed with Cephalosporin use.    VACCINATION STATUS:  There is no immunization history on file for this patient.  Diabetes  She presents for her follow-up diabetic visit. She has type 2 diabetes mellitus. Onset time: she was diagnosed at approximate age of 57 years. Her disease course has been stable. There are no hypoglycemic associated symptoms. Pertinent negatives for hypoglycemia include no confusion, headaches, pallor or seizures. Associated symptoms include polydipsia. Pertinent negatives for diabetes include no chest pain, no fatigue, no polyphagia and no polyuria. There are no hypoglycemic complications. Symptoms are stable. Diabetic complications include autonomic neuropathy. Risk factors for coronary artery disease include diabetes mellitus, dyslipidemia, obesity and sedentary lifestyle. Her weight is stable. She is following a generally unhealthy diet. She has not had a previous visit with a dietitian. She never participates in exercise. Her home blood glucose trend is fluctuating minimally. Her breakfast blood glucose range is generally 140-180 mg/dl. Her lunch blood glucose  range is generally 140-180 mg/dl. Her dinner blood glucose range is generally 140-180 mg/dl. Her bedtime blood glucose range is generally 140-180 mg/dl. Her overall blood glucose range is 140-180 mg/dl. An ACE inhibitor/angiotensin II receptor blocker is being taken.  Thyroid Problem  Presents for follow-up (She is currently on levothyroxine 137 mcg p.o. nightly.  She reports compliance with medication.) visit. Patient reports no cold intolerance, diarrhea, fatigue, heat intolerance or palpitations. The symptoms have been improving. Past treatments include levothyroxine. Her past medical history is significant for diabetes and hyperlipidemia.  Hyperlipidemia  This is a chronic problem. The current episode started more than 1 year ago. Exacerbating diseases include diabetes and obesity. Pertinent negatives include no chest pain, myalgias or shortness of breath. Current antihyperlipidemic treatment includes statins. Risk factors for coronary artery disease include dyslipidemia, diabetes mellitus, hypertension, obesity, a sedentary lifestyle and post-menopausal.  Hypertension  This is a chronic problem. The current episode started more than 1 year ago. The problem is controlled. Pertinent negatives include no chest pain, headaches, palpitations or shortness of breath. Risk factors for coronary artery disease include dyslipidemia, diabetes mellitus, obesity, post-menopausal state and sedentary lifestyle. Past treatments include angiotensin blockers. Identifiable causes of hypertension include a thyroid problem.     Review of Systems  Constitutional: Negative for fatigue and unexpected weight change.  HENT: Negative for trouble swallowing and voice change.   Eyes: Negative for visual disturbance.  Respiratory: Negative for cough, shortness of breath and wheezing.   Cardiovascular: Negative for chest pain, palpitations and leg swelling.  Gastrointestinal: Negative for diarrhea, nausea and vomiting.   Endocrine: Positive for polydipsia. Negative for cold intolerance, heat intolerance, polyphagia and polyuria.  Musculoskeletal: Negative for arthralgias and myalgias.  Skin: Negative for color change, pallor, rash and wound.  Neurological: Negative for seizures and headaches.       She complains of bilateral feet paresthesias and tingling.  Psychiatric/Behavioral: Negative for confusion and suicidal ideas.    Objective:    BP (!) 141/78   Pulse 66   Ht _0  (1.626 m)   Wt 223 lb (101.2 kg)   BMI 38.28 kg/m   Wt Readings from Last 3 Encounters:  09/22/17 223 lb (101.2 kg)  06/23/17 223 lb (101.2 kg)  05/03/17 221 lb (100.2 kg)    Physical Exam  Constitutional: She is oriented to person, place, and time. She appears well-developed.  HENT:  Head: Normocephalic and atraumatic.  Eyes: EOM are normal.  Neck: Normal range of motion. Neck supple. No tracheal deviation present. No thyromegaly present.  Cardiovascular: Normal rate.  Pulmonary/Chest: Effort normal.  Abdominal: There is no tenderness. There is no guarding.  Musculoskeletal: Normal range of motion. She exhibits no edema.  Neurological: She is alert and oriented to person, place, and time. No cranial nerve deficit. Coordination normal.  Skin: Skin is warm and dry. No rash noted. No erythema. No pallor.  Psychiatric: She has a normal mood and affect. Judgment normal.    Results for orders placed or performed in visit on 06/23/17  COMPLETE METABOLIC PANEL WITH GFR  Result Value Ref Range   Glucose, Bld 171 (H) 65 - 99 mg/dL   BUN 14 7 - 25 mg/dL   Creat 1.27 (H) 0.60 - 0.93 mg/dL   GFR, Est Non African American 42 (L) > OR = 60 mL/min/1.82m   GFR, Est African American 48 (L) > OR = 60 mL/min/1.756m  BUN/Creatinine Ratio 11 6 - 22 (calc)   Sodium 143 135 - 146 mmol/L   Potassium 3.8 3.5 - 5.3 mmol/L   Chloride 109 98 - 110 mmol/L   CO2 26 20 - 32 mmol/L   Calcium 9.2 8.6 - 10.4 mg/dL   Total Protein 6.0 (L) 6.1  - 8.1 g/dL   Albumin 3.5 (L) 3.6 - 5.1 g/dL   Globulin 2.5 1.9 - 3.7 g/dL (calc)   AG Ratio 1.4 1.0 - 2.5 (calc)   Total Bilirubin 0.3 0.2 - 1.2 mg/dL   Alkaline phosphatase (APISO) 107 33 - 130 U/L   AST 14 10 - 35 U/L   ALT 13 6 - 29 U/L  Hemoglobin A1c  Result Value Ref Range   Hgb A1c MFr Bld 7.6 (H) <5.7 % of total Hgb   Mean Plasma Glucose 171 (calc)   eAG (mmol/L) 9.5 (calc)   Diabetic Labs (most recent): Lab Results  Component Value Date   HGBA1C 7.6 (H) 09/15/2017   HGBA1C 7.3 (H) 06/15/2017   HGBA1C 7.3 (H) 03/09/2017   Lipid Panel     Component Value Date/Time   CHOL 218 (H) 03/09/2017 1019   TRIG 167 (H) 03/09/2017 1019   HDL 63 03/09/2017 1019   CHOLHDL 3.5 03/09/2017 1019   LDLCALC 127 (H) 03/09/2017 1019     Assessment & Plan:   1. Uncontrolled type 2 diabetes mellitus with complication, with long-term current use of insulin ([place])  Her diabetes is  complicated by neuropathy and patient remains at a high risk for more acute and chronic complications of diabetes which include CAD, CVA, CKD, retinopathy, and neuropathy. These are all discussed in detail with the patient.  She returns with A1c of 7.6%, generally improving from 9% last year.   Glucose logs and insulin administration records pertaining to this visit,  to be scanned into patient's records.  She made insulin dosing errors. Recent labs reviewed.   - I have re-counseled the patient on diet management and weight loss  by adopting a carbohydrate restricted / protein rich  Diet.  -She still admits to dietary indiscretion.    -  Suggestion is made for her to avoid simple carbohydrates  from her diet including Cakes, Sweet Desserts / Pastries, Ice Cream, Soda (diet and regular), Sweet Tea, Candies, Chips, Cookies, Store Bought Juices, Alcohol in Excess of  1-2 drinks a day, Artificial Sweeteners, and "Sugar-free" Products. This will help patient to have stable blood glucose profile and potentially avoid  unintended weight gain.   - Patient is advised to stick to a routine mealtimes to eat 3 meals  a day and avoid unnecessary snacks ( to snack only to correct hypoglycemia).  - I have approached patient with the following individualized plan to manage diabetes and patient agrees.  -  She still makes dosing errors of   NovoLog , taking it at random doses than prescribed and at times taking it at bedtime against advice. I have discussed these details with her. - I advised her to continue Lantus at 50  units nightly, continue NovoLog 10 units ( only during meals)  3 times a day before meals plus correction. I discussed the errors she made dosing NovoLog .  - Continue Metformin 542m  by mouth twice a day with meals.  -  Adjustment  parameters are given for hypo and hyperglycemia in writing. -Patient is encouraged to call clinic for blood glucose levels less than 70 or above 300 mg /dl.  - Patient specific target  for A1c; LDL, HDL, Triglycerides, and  Waist Circumference were discussed in detail.  2) BP/HTN: Her blood pressure is not controlled to target.  She has enough medications including amlodipine/olmesartan 10-40 mg p.o. daily .  3) Lipids/HPL: Her lipid panel is uncontrolled with LDL of 127.  She is on simvastatin 40 mg p.o. Nightly.  Her insurance did not provide coverage for Repatha.  4)  Weight/Diet: CDE consult in progress, exercise, and carbohydrates information provided.  5) hypothyroidism: -Her thyroid function tests are consistent with appropriate replacement.  -I advised her to continue levothyroxine 137 mcg p.o. every morning.     - We discussed about correct intake of levothyroxine, at fasting, with water, separated by at least 30 minutes from breakfast, and separated by more than 4 hours from calcium, iron, multivitamins, acid reflux medications (PPIs). -Patient is made aware of the fact that thyroid hormone replacement is needed for life,  dose to be adjusted by periodic  monitoring of thyroid function tests.  6) Chronic Care/Health Maintenance:  -Patient is on ACEI/ARB and Statin medications and encouraged to continue to follow up with Ophthalmology, Podiatrist at least yearly or according to recommendations, and advised to  stay away from smoking. I have recommended yearly flu vaccine and pneumonia vaccination at least every 5 years; moderate intensity exercise for up to 150 minutes weekly; and  sleep for at least 7 hours a day.  I advised patient to maintain close follow up with her PCP for primary care needs.  - Time spent with the patient: 25 min, of which >50% was spent in reviewing her blood glucose logs , discussing her hypo- and hyper-glycemic episodes, reviewing her current and  previous labs and insulin doses and developing a plan to avoid hypo- and hyper-glycemia. Please refer to Patient Instructions for Blood Glucose Monitoring and Insulin/Medications Dosing Guide"  in media tab for additional information. Holly Baldwin participated in the discussions, expressed understanding, and voiced agreement with the above plans.  All questions were answered to her satisfaction. she is encouraged to contact clinic should she have any questions or concerns prior to her return visit.   Follow up plan: Return in about 4 months (around 01/23/2018) for follow up with pre-visit labs, meter, and logs.  Glade Lloyd, MD Phone: [phone]  Fax: [phone]  This note was partially dictated with voice recognition software. Similar sounding words can be transcribed inadequately or may not  be corrected upon review.  09/22/2017, 2:20 PM

## 2017-09-22 NOTE — Patient Instructions (Signed)

## 2017-10-04 ENCOUNTER — Other Ambulatory Visit: Payer: Self-pay | Admitting: Family Medicine

## 2017-11-11 ENCOUNTER — Other Ambulatory Visit: Payer: Self-pay | Admitting: "Endocrinology

## 2017-11-23 DIAGNOSIS — H3581 Retinal edema: Secondary | ICD-10-CM | POA: Diagnosis not present

## 2017-11-23 LAB — HM DIABETES EYE EXAM

## 2017-11-25 ENCOUNTER — Encounter (INDEPENDENT_AMBULATORY_CARE_PROVIDER_SITE_OTHER): Payer: Medicare HMO | Admitting: Ophthalmology

## 2017-11-25 DIAGNOSIS — H2513 Age-related nuclear cataract, bilateral: Secondary | ICD-10-CM | POA: Diagnosis not present

## 2017-11-25 DIAGNOSIS — I10 Essential (primary) hypertension: Secondary | ICD-10-CM | POA: Diagnosis not present

## 2017-11-25 DIAGNOSIS — E113512 Type 2 diabetes mellitus with proliferative diabetic retinopathy with macular edema, left eye: Secondary | ICD-10-CM | POA: Diagnosis not present

## 2017-11-25 DIAGNOSIS — E113311 Type 2 diabetes mellitus with moderate nonproliferative diabetic retinopathy with macular edema, right eye: Secondary | ICD-10-CM

## 2017-11-25 DIAGNOSIS — H35033 Hypertensive retinopathy, bilateral: Secondary | ICD-10-CM | POA: Diagnosis not present

## 2017-11-25 DIAGNOSIS — H43813 Vitreous degeneration, bilateral: Secondary | ICD-10-CM

## 2017-11-25 DIAGNOSIS — E11311 Type 2 diabetes mellitus with unspecified diabetic retinopathy with macular edema: Secondary | ICD-10-CM

## 2017-11-30 DIAGNOSIS — Z23 Encounter for immunization: Secondary | ICD-10-CM | POA: Diagnosis not present

## 2017-12-14 DIAGNOSIS — E782 Mixed hyperlipidemia: Secondary | ICD-10-CM | POA: Diagnosis not present

## 2017-12-14 DIAGNOSIS — E118 Type 2 diabetes mellitus with unspecified complications: Secondary | ICD-10-CM | POA: Diagnosis not present

## 2017-12-14 DIAGNOSIS — Z794 Long term (current) use of insulin: Secondary | ICD-10-CM | POA: Diagnosis not present

## 2017-12-14 DIAGNOSIS — E1165 Type 2 diabetes mellitus with hyperglycemia: Secondary | ICD-10-CM | POA: Diagnosis not present

## 2017-12-15 LAB — MICROALBUMIN / CREATININE URINE RATIO
Creatinine, Urine: 25 mg/dL (ref 20–275)
Microalb Creat Ratio: 6896 mcg/mg creat — ABNORMAL HIGH (ref <30)
Microalb, Ur: 172.4 mg/dL

## 2017-12-15 LAB — LIPID PANEL
Cholesterol: 189 mg/dL (ref <200)
HDL: 51 mg/dL (ref >50)
LDL Cholesterol (Calc): 105 mg/dL (calc) — ABNORMAL HIGH
Non-HDL Cholesterol (Calc): 138 mg/dL (calc) — ABNORMAL HIGH (ref <130)
Total CHOL/HDL Ratio: 3.7 (calc) (ref <5.0)
Triglycerides: 221 mg/dL — ABNORMAL HIGH (ref <150)

## 2017-12-15 LAB — COMPLETE METABOLIC PANEL WITH GFR
AG Ratio: 1.5 (calc) (ref 1.0–2.5)
ALT: 15 U/L (ref 6–29)
AST: 13 U/L (ref 10–35)
Albumin: 3.4 g/dL — ABNORMAL LOW (ref 3.6–5.1)
Alkaline phosphatase (APISO): 102 U/L (ref 33–130)
BUN/Creatinine Ratio: 15 (calc) (ref 6–22)
BUN: 21 mg/dL (ref 7–25)
CO2: 28 mmol/L (ref 20–32)
Calcium: 9.2 mg/dL (ref 8.6–10.4)
Chloride: 110 mmol/L (ref 98–110)
Creat: 1.4 mg/dL — ABNORMAL HIGH (ref 0.60–0.93)
GFR, Est African American: 43 mL/min/{1.73_m2} — ABNORMAL LOW (ref >=60)
GFR, Est Non African American: 37 mL/min/{1.73_m2} — ABNORMAL LOW (ref >=60)
Globulin: 2.3 g/dL (calc) (ref 1.9–3.7)
Glucose, Bld: 108 mg/dL — ABNORMAL HIGH (ref 65–99)
Potassium: 3.7 mmol/L (ref 3.5–5.3)
Sodium: 146 mmol/L (ref 135–146)
Total Bilirubin: 0.3 mg/dL (ref 0.2–1.2)
Total Protein: 5.7 g/dL — ABNORMAL LOW (ref 6.1–8.1)

## 2017-12-15 LAB — HEMOGLOBIN A1C
Hgb A1c MFr Bld: 7.3 % of total Hgb — ABNORMAL HIGH (ref <5.7)
Mean Plasma Glucose: 163 (calc)
eAG (mmol/L): 9 (calc)

## 2017-12-17 ENCOUNTER — Encounter: Payer: Self-pay | Admitting: "Endocrinology

## 2017-12-17 ENCOUNTER — Ambulatory Visit (INDEPENDENT_AMBULATORY_CARE_PROVIDER_SITE_OTHER): Payer: Medicare HMO | Admitting: "Endocrinology

## 2017-12-17 VITALS — BP 144/75 | HR 78 | Ht 64.0 in | Wt 223.0 lb

## 2017-12-17 DIAGNOSIS — Z794 Long term (current) use of insulin: Secondary | ICD-10-CM | POA: Diagnosis not present

## 2017-12-17 DIAGNOSIS — E039 Hypothyroidism, unspecified: Secondary | ICD-10-CM

## 2017-12-17 DIAGNOSIS — E1165 Type 2 diabetes mellitus with hyperglycemia: Secondary | ICD-10-CM | POA: Diagnosis not present

## 2017-12-17 DIAGNOSIS — E118 Type 2 diabetes mellitus with unspecified complications: Secondary | ICD-10-CM

## 2017-12-17 DIAGNOSIS — E782 Mixed hyperlipidemia: Secondary | ICD-10-CM

## 2017-12-17 DIAGNOSIS — Z6838 Body mass index (BMI) 38.0-38.9, adult: Secondary | ICD-10-CM | POA: Diagnosis not present

## 2017-12-17 DIAGNOSIS — IMO0002 Reserved for concepts with insufficient information to code with codable children: Secondary | ICD-10-CM

## 2017-12-17 DIAGNOSIS — I10 Essential (primary) hypertension: Secondary | ICD-10-CM | POA: Diagnosis not present

## 2017-12-17 NOTE — Progress Notes (Signed)
Endocrinology follow-up note   Subjective:    Patient ID: Holly Baldwin, female    DOB: [birth year],    Past Medical History:  Diagnosis Date  . Anxiety   . Chronic abdominal pain   . Diabetes mellitus   . GERD (gastroesophageal reflux disease)   . Hiatal hernia   . Hypercholesteremia   . Hypertension    Past Surgical History:  Procedure Laterality Date  . ABDOMINAL HYSTERECTOMY    . COLONOSCOPY N/A 06/23/2012   Procedure: COLONOSCOPY;  Surgeon: Rogene Houston, MD;  Location: [facility];  Service: Endoscopy;  Laterality: N/A;  100-moved to 1200 Ann to notify pt  . ESOPHAGOGASTRODUODENOSCOPY N/A 05/06/2012   Procedure: ESOPHAGOGASTRODUODENOSCOPY (EGD);  Surgeon: Rogene Houston, MD;  Location: [facility];  Service: Endoscopy;  Laterality: N/A;  . MASS EXCISION Right 05/26/2012   Procedure: EXCISION NEOPLASM RIGHT THIGH;  Surgeon: Jamesetta So, MD;  Location: [facility];  Service: General;  Laterality: Right;   Social History   Socioeconomic History  . Marital status: Married    Spouse name: Not on file  . Number of children: Not on file  . Years of education: Not on file  . Highest education level: Not on file  Occupational History  . Not on file  Social Needs  . Financial resource strain: Not on file  . Food insecurity:    Worry: Not on file    Inability: Not on file  . Transportation needs:    Medical: Not on file    Non-medical: Not on file  Tobacco Use  . Smoking status: Never Smoker  . Smokeless tobacco: Never Used  Substance and Sexual Activity  . Alcohol use: No  . Drug use: No  . Sexual activity: Not on file  Lifestyle  . Physical activity:    Days per week: Not on file    Minutes per session: Not on file  . Stress: Not on file  Relationships  . Social connections:    Talks on phone: Not on file    Gets together: Not on file    Attends religious service: Not on file    Active member of club or organization: Not on file    Attends meetings of  clubs or organizations: Not on file    Relationship status: Not on file  Other Topics Concern  . Not on file  Social History Narrative  . Not on file   Outpatient Encounter Medications as of 12/17/2017  Medication Sig  . albuterol (PROVENTIL HFA;VENTOLIN HFA) 108 (90 Base) MCG/ACT inhaler Inhale into the lungs every 6 (six) hours as needed for wheezing or shortness of breath.  Marland Kitchen amLODipine-olmesartan (AZOR) 10-40 MG tablet Take 1 tablet by mouth daily.  Marland Kitchen aspirin EC 81 MG tablet Take 81 mg by mouth at bedtime.  . Blood Glucose Monitoring Suppl (ACCU-CHEK AVIVA PLUS) w/Device KIT TEST FOUR TIMES DAILY  . budesonide-formoterol (SYMBICORT) 160-4.5 MCG/ACT inhaler Inhale 2 puffs into the lungs 2 (two) times daily.  . capsaicin (ZOSTRIX) 0.025 % cream Apply topically 2 (two) times daily.  . cloNIDine (CATAPRES) 0.2 MG tablet Take 0.2 mg by mouth 2 (two) times daily.   . cycloSPORINE (RESTASIS) 0.05 % ophthalmic emulsion Place 1 drop into both eyes daily.  Marland Kitchen EPINEPHrine (EPI-PEN) 0.3 mg/0.3 mL DEVI Inject 0.3 mg into the muscle once.  Marland Kitchen FLUoxetine (PROZAC) 20 MG capsule Take 20 mg by mouth every morning.   . gabapentin (NEURONTIN) 100 MG capsule Take  1 capsule (100 mg total) by mouth 2 (two) times daily as needed. (Patient taking differently: Take 300 mg by mouth daily. )  . Insulin Glargine (LANTUS SOLOSTAR) 100 UNIT/ML Solostar Pen INJECT  50 UNITS SUBCUTANEOUSLY  AT  10PM  . levothyroxine (SYNTHROID, LEVOTHROID) 137 MCG tablet TAKE 1 TABLET EVERY DAY BEFORE BREAKFAST  . metFORMIN (GLUCOPHAGE) 500 MG tablet TAKE 1 TABLET TWICE DAILY  . NOVOLOG FLEXPEN 100 UNIT/ML FlexPen INJECT 15 TO 21 UNITS INTO THE SKIN THREE TIMES DAILY WITH MEALS. (Patient taking differently: 10-16 Units. )  . simvastatin (ZOCOR) 40 MG tablet TAKE 1 TABLET EVERY DAY  . topiramate (TOPAMAX) 100 MG tablet Take 1 tablet by mouth 2 (two) times daily.  . ziprasidone (GEODON) 80 MG capsule Take 80 mg by mouth at bedtime.    Marland Kitchen  zolpidem (AMBIEN) 5 MG tablet Take 5 mg by mouth at bedtime as needed for sleep.   No facility-administered encounter medications on file as of 12/17/2017.    ALLERGIES: Allergies  Allergen Reactions  . Bee Venom Anaphylaxis  . Ace Inhibitors   . Penicillins Hives    .Has patient had a PCN reaction causing immediate rash, facial/tongue/throat swelling, SOB or lightheadedness with hypotension: Yes Has patient had a PCN reaction causing severe rash involving mucus membranes or skin necrosis: No Has patient had a PCN reaction that required hospitalization: Yes Has patient had a PCN reaction occurring within the last 10 years: No If all of the above answers are "NO", then may proceed with Cephalosporin use.    VACCINATION STATUS:  There is no immunization history on file for this patient.  Diabetes  She presents for her follow-up diabetic visit. She has type 2 diabetes mellitus. Onset time: she was diagnosed at approximate age of 88 years. Her disease course has been improving. There are no hypoglycemic associated symptoms. Pertinent negatives for hypoglycemia include no confusion, headaches, pallor or seizures. Pertinent negatives for diabetes include no chest pain, no fatigue, no polydipsia, no polyphagia and no polyuria. There are no hypoglycemic complications. Symptoms are improving. Diabetic complications include autonomic neuropathy. Risk factors for coronary artery disease include diabetes mellitus, dyslipidemia, obesity and sedentary lifestyle. Her weight is stable. She is following a generally unhealthy diet. She has not had a previous visit with a dietitian. She never participates in exercise. Her home blood glucose trend is fluctuating minimally. Her breakfast blood glucose range is generally 140-180 mg/dl. Her lunch blood glucose range is generally 140-180 mg/dl. Her dinner blood glucose range is generally 140-180 mg/dl. Her bedtime blood glucose range is generally 140-180 mg/dl. Her  overall blood glucose range is 140-180 mg/dl. An ACE inhibitor/angiotensin II receptor blocker is being taken.  Thyroid Problem  Presents for follow-up (She is currently on levothyroxine 137 mcg p.o. nightly.  She reports compliance with medication.) visit. Patient reports no cold intolerance, diarrhea, fatigue, heat intolerance or palpitations. The symptoms have been stable. Past treatments include levothyroxine. Her past medical history is significant for diabetes and hyperlipidemia.  Hyperlipidemia  This is a chronic problem. The current episode started more than 1 year ago. Exacerbating diseases include diabetes and obesity. Pertinent negatives include no chest pain, myalgias or shortness of breath. Current antihyperlipidemic treatment includes statins. Risk factors for coronary artery disease include dyslipidemia, diabetes mellitus, hypertension, obesity, a sedentary lifestyle and post-menopausal.  Hypertension  This is a chronic problem. The current episode started more than 1 year ago. The problem is controlled. Pertinent negatives include no chest pain, headaches, palpitations  or shortness of breath. Risk factors for coronary artery disease include dyslipidemia, diabetes mellitus, obesity, post-menopausal state and sedentary lifestyle. Past treatments include angiotensin blockers. Identifiable causes of hypertension include a thyroid problem.     Review of Systems  Constitutional: Negative for fatigue and unexpected weight change.  HENT: Negative for trouble swallowing and voice change.   Eyes: Negative for visual disturbance.  Respiratory: Negative for cough, shortness of breath and wheezing.   Cardiovascular: Negative for chest pain, palpitations and leg swelling.  Gastrointestinal: Negative for diarrhea, nausea and vomiting.  Endocrine: Negative for cold intolerance, heat intolerance, polydipsia, polyphagia and polyuria.  Musculoskeletal: Negative for arthralgias and myalgias.  Skin:  Negative for color change, pallor, rash and wound.  Neurological: Negative for seizures and headaches.       She complains of bilateral feet paresthesias and tingling.  Psychiatric/Behavioral: Negative for confusion and suicidal ideas.    Objective:    BP (!) 144/75   Pulse 78   Ht '5\' 4"'  (1.626 m)   Wt 223 lb (101.2 kg)   BMI 38.28 kg/m   Wt Readings from Last 3 Encounters:  12/17/17 223 lb (101.2 kg)  09/22/17 223 lb (101.2 kg)  06/23/17 223 lb (101.2 kg)    Physical Exam  Constitutional: She is oriented to person, place, and time. She appears well-developed.  HENT:  Head: Normocephalic and atraumatic.  Eyes: EOM are normal.  Neck: Normal range of motion. Neck supple. No tracheal deviation present. No thyromegaly present.  Cardiovascular: Normal rate.  Pulmonary/Chest: Effort normal.  Abdominal: There is no tenderness. There is no guarding.  Musculoskeletal: Normal range of motion. She exhibits no edema.  Neurological: She is alert and oriented to person, place, and time. No cranial nerve deficit. Coordination normal.  Skin: Skin is warm and dry. No rash noted. No erythema. No pallor.  Psychiatric: She has a normal mood and affect. Judgment normal.    Results for orders placed or performed in visit on 09/22/17  COMPLETE METABOLIC PANEL WITH GFR  Result Value Ref Range   Glucose, Bld 108 (H) 65 - 99 mg/dL   BUN 21 7 - 25 mg/dL   Creat 1.40 (H) 0.60 - 0.93 mg/dL   GFR, Est Non African American 37 (L) > OR = 60 mL/min/1.20m   GFR, Est African American 43 (L) > OR = 60 mL/min/1.729m  BUN/Creatinine Ratio 15 6 - 22 (calc)   Sodium 146 135 - 146 mmol/L   Potassium 3.7 3.5 - 5.3 mmol/L   Chloride 110 98 - 110 mmol/L   CO2 28 20 - 32 mmol/L   Calcium 9.2 8.6 - 10.4 mg/dL   Total Protein 5.7 (L) 6.1 - 8.1 g/dL   Albumin 3.4 (L) 3.6 - 5.1 g/dL   Globulin 2.3 1.9 - 3.7 g/dL (calc)   AG Ratio 1.5 1.0 - 2.5 (calc)   Total Bilirubin 0.3 0.2 - 1.2 mg/dL   Alkaline phosphatase  (APISO) 102 33 - 130 U/L   AST 13 10 - 35 U/L   ALT 15 6 - 29 U/L  Hemoglobin A1c  Result Value Ref Range   Hgb A1c MFr Bld 7.3 (H) <5.7 % of total Hgb   Mean Plasma Glucose 163 (calc)   eAG (mmol/L) 9.0 (calc)  Microalbumin / creatinine urine ratio  Result Value Ref Range   Creatinine, Urine 25 20 - 275 mg/dL   Microalb, Ur 172.4 mg/dL   Microalb Creat Ratio 6,896 (H) <30 mcg/mg creat  Lipid  panel  Result Value Ref Range   Cholesterol 189 <200 mg/dL   HDL 51 >50 mg/dL   Triglycerides 221 (H) <150 mg/dL   LDL Cholesterol (Calc) 105 (H) mg/dL (calc)   Total CHOL/HDL Ratio 3.7 <5.0 (calc)   Non-HDL Cholesterol (Calc) 138 (H) <130 mg/dL (calc)   Diabetic Labs (most recent): Lab Results  Component Value Date   HGBA1C 7.3 (H) 12/14/2017   HGBA1C 7.6 (H) 09/15/2017   HGBA1C 7.3 (H) 06/15/2017   Lipid Panel     Component Value Date/Time   CHOL 189 12/14/2017 0921   TRIG 221 (H) 12/14/2017 0921   HDL 51 12/14/2017 0921   CHOLHDL 3.7 12/14/2017 0921   LDLCALC 105 (H) 12/14/2017 0921     Assessment & Plan:   1. Uncontrolled type 2 diabetes mellitus with complication, with long-term current use of insulin (HCC)  Her diabetes is  complicated by neuropathy , microalbuminuria, and patient remains at a high risk for more acute and chronic complications of diabetes which include CAD, CVA, CKD, retinopathy, and neuropathy. These are all discussed in detail with the patient.  She returns with A1c of 7.3%, progressively improving from 9%.     Glucose logs and insulin administration records pertaining to this visit,  to be scanned into patient's records.  Recent labs reviewed.   - I have re-counseled the patient on diet management and weight loss  by adopting a carbohydrate restricted / protein rich  Diet.  -She still admits to dietary indiscretion.    -  Suggestion is made for her to avoid simple carbohydrates  from her diet including Cakes, Sweet Desserts / Pastries, Ice Cream,  Soda (diet and regular), Sweet Tea, Candies, Chips, Cookies, Store Bought Juices, Alcohol in Excess of  1-2 drinks a day, Artificial Sweeteners, and "Sugar-free" Products. This will help patient to have stable blood glucose profile and potentially avoid unintended weight gain.  - Patient is advised to stick to a routine mealtimes to eat 3 meals  a day and avoid unnecessary snacks ( to snack only to correct hypoglycemia).  - I have approached patient with the following individualized plan to manage diabetes and patient agrees.  -She presents with better engagement for monitoring and documentation of her insulin activities.  -She is advised to continue Lantus 50 units nightly, continue NovoLog 10 10 units ( only during meals)  3 times a day before meals plus correction. I discussed the errors she made dosing NovoLog .  -She is advised to continue Metformin 592m  by mouth twice a day with meals.  -  Adjustment  parameters are given for hypo and hyperglycemia in writing. -Patient is encouraged to call clinic for blood glucose levels less than 70 or above 300 mg /dl.  - Patient specific target  for A1c; LDL, HDL, Triglycerides, and  Waist Circumference were discussed in detail.  2) BP/HTN: Her blood pressure is not controlled to target.  She has enough medications including amlodipine/olmesartan 10-40 mg p.o. daily .  She is advised to be more consistent in taking her blood pressure medications.  3) Lipids/HPL: Her recent lipid panel showed improved LDL at 105 from 127.  He is advised to continue simvastatin 40 mg p.o. nightly.    4)  Weight/Diet: CDE consult in progress, exercise, and carbohydrates information provided.  5) hypothyroidism: -Her thyroid function tests are consistent with appropriate replacement.  -She is advised to continue  levothyroxine 137 mcg p.o. every morning.     - We  discussed about correct intake of levothyroxine, at fasting, with water, separated by at least 30 minutes  from breakfast, and separated by more than 4 hours from calcium, iron, multivitamins, acid reflux medications (PPIs). -Patient is made aware of the fact that thyroid hormone replacement is needed for life, dose to be adjusted by periodic monitoring of thyroid function tests.  6) Chronic Care/Health Maintenance:  -Patient is on ACEI/ARB and Statin medications and encouraged to continue to follow up with Ophthalmology, Podiatrist at least yearly or according to recommendations, and advised to  stay away from smoking. I have recommended yearly flu vaccine and pneumonia vaccination at least every 5 years; moderate intensity exercise for up to 150 minutes weekly; and  sleep for at least 7 hours a day.  I advised patient to maintain close follow up with her PCP for primary care needs.  - Time spent with the patient: 25 min, of which >50% was spent in reviewing her blood glucose logs , discussing her hypo- and hyper-glycemic episodes, reviewing her current and  previous labs and insulin doses and developing a plan to avoid hypo- and hyper-glycemia. Please refer to Patient Instructions for Blood Glucose Monitoring and Insulin/Medications Dosing Guide"  in media tab for additional information. Holly Baldwin participated in the discussions, expressed understanding, and voiced agreement with the above plans.  All questions were answered to her satisfaction. she is encouraged to contact clinic should she have any questions or concerns prior to her return visit.    Follow up plan: Return in about 3 months (around 03/19/2018) for Meter, and Logs.  Glade Lloyd, MD Phone: [phone]  Fax: [phone]  This note was partially dictated with voice recognition software. Similar sounding words can be transcribed inadequately or may not  be corrected upon review.  12/17/2017, 1:54 PM

## 2017-12-17 NOTE — Patient Instructions (Signed)

## 2017-12-23 ENCOUNTER — Encounter (INDEPENDENT_AMBULATORY_CARE_PROVIDER_SITE_OTHER): Payer: Medicare HMO | Admitting: Ophthalmology

## 2017-12-23 DIAGNOSIS — H43813 Vitreous degeneration, bilateral: Secondary | ICD-10-CM

## 2017-12-23 DIAGNOSIS — E11311 Type 2 diabetes mellitus with unspecified diabetic retinopathy with macular edema: Secondary | ICD-10-CM

## 2017-12-23 DIAGNOSIS — H2513 Age-related nuclear cataract, bilateral: Secondary | ICD-10-CM

## 2017-12-23 DIAGNOSIS — I10 Essential (primary) hypertension: Secondary | ICD-10-CM

## 2017-12-23 DIAGNOSIS — H35033 Hypertensive retinopathy, bilateral: Secondary | ICD-10-CM | POA: Diagnosis not present

## 2017-12-23 DIAGNOSIS — E113512 Type 2 diabetes mellitus with proliferative diabetic retinopathy with macular edema, left eye: Secondary | ICD-10-CM

## 2017-12-23 DIAGNOSIS — E113311 Type 2 diabetes mellitus with moderate nonproliferative diabetic retinopathy with macular edema, right eye: Secondary | ICD-10-CM | POA: Diagnosis not present

## 2018-01-06 DIAGNOSIS — F320 Major depressive disorder, single episode, mild: Secondary | ICD-10-CM | POA: Diagnosis not present

## 2018-01-06 DIAGNOSIS — Z6837 Body mass index (BMI) 37.0-37.9, adult: Secondary | ICD-10-CM | POA: Diagnosis not present

## 2018-01-07 ENCOUNTER — Other Ambulatory Visit (HOSPITAL_COMMUNITY): Payer: Self-pay | Admitting: Family Medicine

## 2018-01-07 ENCOUNTER — Other Ambulatory Visit: Payer: Self-pay

## 2018-01-07 DIAGNOSIS — Z1231 Encounter for screening mammogram for malignant neoplasm of breast: Secondary | ICD-10-CM

## 2018-01-15 ENCOUNTER — Ambulatory Visit (HOSPITAL_COMMUNITY)
Admission: RE | Admit: 2018-01-15 | Discharge: 2018-01-15 | Disposition: A | Discharge status: Home / Self Care | Payer: Medicare HMO | Source: Ambulatory Visit | Attending: Family Medicine | Admitting: Family Medicine

## 2018-01-15 DIAGNOSIS — Z1231 Encounter for screening mammogram for malignant neoplasm of breast: Secondary | ICD-10-CM | POA: Insufficient documentation

## 2018-01-20 ENCOUNTER — Encounter (INDEPENDENT_AMBULATORY_CARE_PROVIDER_SITE_OTHER): Payer: Medicare HMO | Admitting: Ophthalmology

## 2018-01-20 DIAGNOSIS — E113512 Type 2 diabetes mellitus with proliferative diabetic retinopathy with macular edema, left eye: Secondary | ICD-10-CM | POA: Diagnosis not present

## 2018-01-20 DIAGNOSIS — H2513 Age-related nuclear cataract, bilateral: Secondary | ICD-10-CM

## 2018-01-20 DIAGNOSIS — E11311 Type 2 diabetes mellitus with unspecified diabetic retinopathy with macular edema: Secondary | ICD-10-CM

## 2018-01-20 DIAGNOSIS — H35033 Hypertensive retinopathy, bilateral: Secondary | ICD-10-CM | POA: Diagnosis not present

## 2018-01-20 DIAGNOSIS — H43813 Vitreous degeneration, bilateral: Secondary | ICD-10-CM | POA: Diagnosis not present

## 2018-01-20 DIAGNOSIS — I10 Essential (primary) hypertension: Secondary | ICD-10-CM

## 2018-01-20 DIAGNOSIS — E113311 Type 2 diabetes mellitus with moderate nonproliferative diabetic retinopathy with macular edema, right eye: Secondary | ICD-10-CM

## 2018-01-26 ENCOUNTER — Ambulatory Visit: Payer: Self-pay | Admitting: "Endocrinology

## 2018-02-17 ENCOUNTER — Encounter (INDEPENDENT_AMBULATORY_CARE_PROVIDER_SITE_OTHER): Payer: Medicare HMO | Admitting: Ophthalmology

## 2018-02-17 DIAGNOSIS — E113311 Type 2 diabetes mellitus with moderate nonproliferative diabetic retinopathy with macular edema, right eye: Secondary | ICD-10-CM | POA: Diagnosis not present

## 2018-02-17 DIAGNOSIS — E11311 Type 2 diabetes mellitus with unspecified diabetic retinopathy with macular edema: Secondary | ICD-10-CM

## 2018-02-17 DIAGNOSIS — I10 Essential (primary) hypertension: Secondary | ICD-10-CM | POA: Diagnosis not present

## 2018-02-17 DIAGNOSIS — H35033 Hypertensive retinopathy, bilateral: Secondary | ICD-10-CM | POA: Diagnosis not present

## 2018-02-17 DIAGNOSIS — H43813 Vitreous degeneration, bilateral: Secondary | ICD-10-CM

## 2018-02-17 DIAGNOSIS — E113512 Type 2 diabetes mellitus with proliferative diabetic retinopathy with macular edema, left eye: Secondary | ICD-10-CM | POA: Diagnosis not present

## 2018-02-17 DIAGNOSIS — H2513 Age-related nuclear cataract, bilateral: Secondary | ICD-10-CM

## 2018-03-01 ENCOUNTER — Other Ambulatory Visit: Payer: Self-pay | Admitting: Family Medicine

## 2018-03-07 ENCOUNTER — Other Ambulatory Visit: Payer: Self-pay | Admitting: "Endocrinology

## 2018-03-17 ENCOUNTER — Encounter (INDEPENDENT_AMBULATORY_CARE_PROVIDER_SITE_OTHER): Payer: Medicare HMO | Admitting: Ophthalmology

## 2018-03-17 DIAGNOSIS — I10 Essential (primary) hypertension: Secondary | ICD-10-CM

## 2018-03-17 DIAGNOSIS — E113512 Type 2 diabetes mellitus with proliferative diabetic retinopathy with macular edema, left eye: Secondary | ICD-10-CM

## 2018-03-17 DIAGNOSIS — E11311 Type 2 diabetes mellitus with unspecified diabetic retinopathy with macular edema: Secondary | ICD-10-CM

## 2018-03-17 DIAGNOSIS — E113311 Type 2 diabetes mellitus with moderate nonproliferative diabetic retinopathy with macular edema, right eye: Secondary | ICD-10-CM | POA: Diagnosis not present

## 2018-03-17 DIAGNOSIS — H2513 Age-related nuclear cataract, bilateral: Secondary | ICD-10-CM | POA: Diagnosis not present

## 2018-03-17 DIAGNOSIS — E119 Type 2 diabetes mellitus without complications: Secondary | ICD-10-CM | POA: Diagnosis not present

## 2018-03-17 DIAGNOSIS — H43813 Vitreous degeneration, bilateral: Secondary | ICD-10-CM

## 2018-03-17 DIAGNOSIS — H35033 Hypertensive retinopathy, bilateral: Secondary | ICD-10-CM | POA: Diagnosis not present

## 2018-03-22 ENCOUNTER — Ambulatory Visit: Payer: Self-pay | Admitting: "Endocrinology

## 2018-04-06 ENCOUNTER — Encounter (INDEPENDENT_AMBULATORY_CARE_PROVIDER_SITE_OTHER): Payer: Medicare HMO | Admitting: Ophthalmology

## 2018-04-07 ENCOUNTER — Encounter (INDEPENDENT_AMBULATORY_CARE_PROVIDER_SITE_OTHER): Payer: Medicare HMO | Admitting: Ophthalmology

## 2018-04-07 DIAGNOSIS — E113512 Type 2 diabetes mellitus with proliferative diabetic retinopathy with macular edema, left eye: Secondary | ICD-10-CM

## 2018-04-07 DIAGNOSIS — E11311 Type 2 diabetes mellitus with unspecified diabetic retinopathy with macular edema: Secondary | ICD-10-CM

## 2018-04-14 ENCOUNTER — Encounter (INDEPENDENT_AMBULATORY_CARE_PROVIDER_SITE_OTHER): Payer: Medicare HMO | Admitting: Ophthalmology

## 2018-04-14 DIAGNOSIS — E113311 Type 2 diabetes mellitus with moderate nonproliferative diabetic retinopathy with macular edema, right eye: Secondary | ICD-10-CM

## 2018-04-14 DIAGNOSIS — H35033 Hypertensive retinopathy, bilateral: Secondary | ICD-10-CM

## 2018-04-14 DIAGNOSIS — E11311 Type 2 diabetes mellitus with unspecified diabetic retinopathy with macular edema: Secondary | ICD-10-CM | POA: Diagnosis not present

## 2018-04-14 DIAGNOSIS — H43813 Vitreous degeneration, bilateral: Secondary | ICD-10-CM | POA: Diagnosis not present

## 2018-04-14 DIAGNOSIS — E113512 Type 2 diabetes mellitus with proliferative diabetic retinopathy with macular edema, left eye: Secondary | ICD-10-CM

## 2018-04-14 DIAGNOSIS — I10 Essential (primary) hypertension: Secondary | ICD-10-CM | POA: Diagnosis not present

## 2018-04-19 ENCOUNTER — Ambulatory Visit: Payer: Self-pay | Admitting: "Endocrinology

## 2018-04-19 ENCOUNTER — Encounter (INDEPENDENT_AMBULATORY_CARE_PROVIDER_SITE_OTHER): Payer: Medicare HMO | Admitting: Ophthalmology

## 2018-04-19 DIAGNOSIS — E11311 Type 2 diabetes mellitus with unspecified diabetic retinopathy with macular edema: Secondary | ICD-10-CM | POA: Diagnosis not present

## 2018-04-19 DIAGNOSIS — E113311 Type 2 diabetes mellitus with moderate nonproliferative diabetic retinopathy with macular edema, right eye: Secondary | ICD-10-CM

## 2018-04-28 DIAGNOSIS — E1165 Type 2 diabetes mellitus with hyperglycemia: Secondary | ICD-10-CM | POA: Diagnosis not present

## 2018-04-28 DIAGNOSIS — Z794 Long term (current) use of insulin: Secondary | ICD-10-CM | POA: Diagnosis not present

## 2018-04-28 DIAGNOSIS — E118 Type 2 diabetes mellitus with unspecified complications: Secondary | ICD-10-CM | POA: Diagnosis not present

## 2018-04-29 ENCOUNTER — Telehealth: Payer: Self-pay | Admitting: *Deleted

## 2018-04-29 ENCOUNTER — Other Ambulatory Visit: Payer: Self-pay | Admitting: "Endocrinology

## 2018-04-29 LAB — COMPLETE METABOLIC PANEL WITH GFR
AG Ratio: 1.5 (calc) (ref 1.0–2.5)
ALT: 25 U/L (ref 6–29)
AST: 23 U/L (ref 10–35)
Albumin: 3.5 g/dL — ABNORMAL LOW (ref 3.6–5.1)
Alkaline phosphatase (APISO): 126 U/L (ref 37–153)
BUN/Creatinine Ratio: 10 (calc) (ref 6–22)
BUN: 20 mg/dL (ref 7–25)
CO2: 29 mmol/L (ref 20–32)
Calcium: 9.2 mg/dL (ref 8.6–10.4)
Chloride: 112 mmol/L — ABNORMAL HIGH (ref 98–110)
Creat: 1.92 mg/dL — ABNORMAL HIGH (ref 0.60–0.93)
GFR, Est African American: 29 mL/min/{1.73_m2} — ABNORMAL LOW (ref >=60)
GFR, Est Non African American: 25 mL/min/{1.73_m2} — ABNORMAL LOW (ref >=60)
Globulin: 2.3 g/dL (calc) (ref 1.9–3.7)
Glucose, Bld: 69 mg/dL (ref 65–139)
Potassium: 3.7 mmol/L (ref 3.5–5.3)
Sodium: 150 mmol/L — ABNORMAL HIGH (ref 135–146)
Total Bilirubin: 0.3 mg/dL (ref 0.2–1.2)
Total Protein: 5.8 g/dL — ABNORMAL LOW (ref 6.1–8.1)

## 2018-04-29 LAB — HEMOGLOBIN A1C
Hgb A1c MFr Bld: 6.3 % of total Hgb — ABNORMAL HIGH (ref <5.7)
Mean Plasma Glucose: 134 (calc)
eAG (mmol/L): 7.4 (calc)

## 2018-04-29 NOTE — Telephone Encounter (Signed)
Spoke with pt and informed her of Dr. Liliane Channel message pt understood and had no additional questions at this time

## 2018-04-29 NOTE — Telephone Encounter (Signed)
Declining kidney function, we will discuss the details on her next visit.  Advise her to maintain adequate hydration.

## 2018-04-29 NOTE — Telephone Encounter (Signed)
Dr. Dorris Fetch, I returned pt call and pt wanted to know what exactly was her lab results that prompted you to want her to stop her metformin.

## 2018-04-29 NOTE — Telephone Encounter (Signed)
Patient called wanting to know why Dr Dorris Fetch stopped her metformin. Please advise [phone]

## 2018-05-05 ENCOUNTER — Ambulatory Visit (INDEPENDENT_AMBULATORY_CARE_PROVIDER_SITE_OTHER): Payer: Medicare HMO | Admitting: "Endocrinology

## 2018-05-05 ENCOUNTER — Emergency Department (HOSPITAL_COMMUNITY)
Admission: EM | Admit: 2018-05-05 | Discharge: 2018-05-05 | Disposition: A | Discharge status: Home / Self Care | Payer: Medicare HMO | Attending: Emergency Medicine | Admitting: Emergency Medicine

## 2018-05-05 ENCOUNTER — Encounter (HOSPITAL_COMMUNITY): Payer: Self-pay

## 2018-05-05 ENCOUNTER — Encounter: Payer: Self-pay | Admitting: "Endocrinology

## 2018-05-05 VITALS — BP 241/74 | Ht 64.0 in | Wt 215.8 lb

## 2018-05-05 DIAGNOSIS — E039 Hypothyroidism, unspecified: Secondary | ICD-10-CM | POA: Diagnosis not present

## 2018-05-05 DIAGNOSIS — E119 Type 2 diabetes mellitus without complications: Secondary | ICD-10-CM | POA: Diagnosis not present

## 2018-05-05 DIAGNOSIS — I10 Essential (primary) hypertension: Secondary | ICD-10-CM

## 2018-05-05 DIAGNOSIS — E118 Type 2 diabetes mellitus with unspecified complications: Secondary | ICD-10-CM | POA: Diagnosis not present

## 2018-05-05 DIAGNOSIS — Z794 Long term (current) use of insulin: Secondary | ICD-10-CM

## 2018-05-05 DIAGNOSIS — IMO0002 Reserved for concepts with insufficient information to code with codable children: Secondary | ICD-10-CM

## 2018-05-05 DIAGNOSIS — E1165 Type 2 diabetes mellitus with hyperglycemia: Secondary | ICD-10-CM

## 2018-05-05 LAB — BASIC METABOLIC PANEL
Anion gap: 9 (ref 5–15)
BUN: 22 mg/dL (ref 8–23)
CO2: 24 mmol/L (ref 22–32)
Calcium: 9.1 mg/dL (ref 8.9–10.3)
Chloride: 112 mmol/L — ABNORMAL HIGH (ref 98–111)
Creatinine, Ser: 1.69 mg/dL — ABNORMAL HIGH (ref 0.44–1.00)
GFR calc Af Amer: 34 mL/min — ABNORMAL LOW (ref >60)
GFR calc non Af Amer: 30 mL/min — ABNORMAL LOW (ref >60)
Glucose, Bld: 199 mg/dL — ABNORMAL HIGH (ref 70–99)
Potassium: 3.3 mmol/L — ABNORMAL LOW (ref 3.5–5.1)
Sodium: 145 mmol/L (ref 135–145)

## 2018-05-05 LAB — CBC WITH DIFFERENTIAL/PLATELET
Abs Immature Granulocytes: 0.03 10*3/uL (ref 0.00–0.07)
Basophils Absolute: 0.1 10*3/uL (ref 0.0–0.1)
Basophils Relative: 1 %
Eosinophils Absolute: 0.1 10*3/uL (ref 0.0–0.5)
Eosinophils Relative: 2 %
HCT: 39.8 % (ref 36.0–46.0)
Hemoglobin: 12.1 g/dL (ref 12.0–15.0)
Immature Granulocytes: 0 %
Lymphocytes Relative: 19 %
Lymphs Abs: 1.6 10*3/uL (ref 0.7–4.0)
MCH: 26.5 pg (ref 26.0–34.0)
MCHC: 30.4 g/dL (ref 30.0–36.0)
MCV: 87.3 fL (ref 80.0–100.0)
Monocytes Absolute: 1 10*3/uL (ref 0.1–1.0)
Monocytes Relative: 11 %
Neutro Abs: 5.9 10*3/uL (ref 1.7–7.7)
Neutrophils Relative %: 67 %
Platelets: 306 10*3/uL (ref 150–400)
RBC: 4.56 MIL/uL (ref 3.87–5.11)
RDW: 15.3 % (ref 11.5–15.5)
WBC: 8.7 10*3/uL (ref 4.0–10.5)
nRBC: 0 % (ref 0.0–0.2)

## 2018-05-05 MED ORDER — INSULIN GLARGINE 100 UNIT/ML SOLOSTAR PEN: PEN_INJECTOR | SUBCUTANEOUS | 2 refills | Status: DC

## 2018-05-05 MED ORDER — HYDRALAZINE HCL 25 MG PO TABS
25.0000 mg | ORAL_TABLET | Freq: Once | ORAL | Status: AC
  Administered 2018-05-05: 25 mg via ORAL
  Filled 2018-05-05: qty 1

## 2018-05-05 NOTE — Patient Instructions (Addendum)
GO DIRECTLY TO ER FOR SEVERE HYPERTENSION. DO NOT GO HOME BEFORE GOING TO ER TODAY.                                                    Advice for Weight Management  -For most of [place] the best way to lose weight is by diet management. Generally speaking, diet management means consuming less calories intentionally which over time brings about progressive weight loss.  This can be achieved more effectively by restricting carbohydrate consumption to the minimum possible.  More importantly, our carbohydrates sources should be unprocessed or minimally processed complex starch food items.   Sometimes, it is important to balance nutrition by increasing protein intake (animal or plant source), fruits, and vegetables.  -Sticking to a routine mealtime to eat 3 meals a day and avoiding unnecessary snacks is shown to have a big role in weight control.  -It is better to avoid simple carbohydrates including: Cakes, Sweet Desserts, Ice Cream, Soda (diet and regular), Sweet Tea, Candies, Chips, Cookies, Store Bought Juices, Alcohol in Excess of  1-2 drinks a day, Artificial Sweeteners, Doughnuts, Coffee Creamers, "Sugar-free" Products, etc, etc.  This is not a complete list...Marland Kitchen.    -Consulting with certified diabetes educators is proven to provide you with the most accurate and current information on diet.  Also, you may be  interested in discussing diet options/exchanges , we can schedule a visit with Jearld Fenton, RDN, CDE for individualized nutrition education.  -Exercise: If you are able: 30 -60 minutes a day ,4 days a week, or 150 minutes a week.  The longer the better.  Combine stretch, strength, and aerobic activities.  If you were told in the past that you have high risk for cardiovascular diseases, you may seek evaluation by your heart doctor prior to initiating moderate to intense exercise programs.                                  Additional Care Considerations for  Diabetes   -Diabetes  is a chronic disease.  The most important care consideration is regular follow-up with your diabetes care provider with the goal being avoiding or delaying its complications and to take advantage of advances in medications and technology.    -Type 2 diabetes is known to coexist with other important comorbidities such as high blood pressure and high cholesterol.  It is critical to control not only the diabetes but also the high blood pressure and high cholesterol to minimize and delay the risk of complications including coronary artery disease, stroke, amputations, blindness, etc.    - Studies showed that people with diabetes will benefit from a class of medications known as ACE inhibitors and statins.  Unless there are specific reasons not to be on these medications, the standard of care is to consider getting one from these groups of medications at an optimal doses.  These medications are generally considered safe and proven to help protect the heart and the kidneys.    - People with diabetes are encouraged to initiate and maintain regular follow-up with eye doctors, foot doctors, dentists , and if necessary heart and kidney doctors.     - It is highly recommended that people with diabetes quit smoking or stay away from  smoking, and get yearly  flu vaccine and pneumonia vaccine at least every 5 years.  One other important lifestyle recommendation is to ensure adequate sleep - at least 7 hours of uninterrupted sleep at night.  -Exercise: If you are able: 30 -60 minutes a day, 4 days a week, or 150 minutes a week.  The longer the better.  Combine stretch, strength, and aerobic activities.  If you were told in the past that you have high risk for cardiovascular diseases, you may seek evaluation by your heart doctor prior to initiating moderate to intense exercise programs.

## 2018-05-05 NOTE — Progress Notes (Signed)
Endocrinology follow-up note   Subjective:    Patient ID: Holly Baldwin, female    DOB: August 11, 1945,    Past Medical History:  Diagnosis Date  . Anxiety   . Chronic abdominal pain   . Diabetes mellitus   . GERD (gastroesophageal reflux disease)   . Hiatal hernia   . Hypercholesteremia   . Hypertension    Past Surgical History:  Procedure Laterality Date  . ABDOMINAL HYSTERECTOMY    . COLONOSCOPY N/A 06/23/2012   Procedure: COLONOSCOPY;  Surgeon: Rogene Houston, MD;  Location: [facility];  Service: Endoscopy;  Laterality: N/A;  100-moved to 1200 Ann to notify pt  . ESOPHAGOGASTRODUODENOSCOPY N/A 05/06/2012   Procedure: ESOPHAGOGASTRODUODENOSCOPY (EGD);  Surgeon: Rogene Houston, MD;  Location: [facility];  Service: Endoscopy;  Laterality: N/A;  . MASS EXCISION Right 05/26/2012   Procedure: EXCISION NEOPLASM RIGHT THIGH;  Surgeon: Jamesetta So, MD;  Location: [facility];  Service: General;  Laterality: Right;   Social History   Socioeconomic History  . Marital status: Married    Spouse name: Not on file  . Number of children: Not on file  . Years of education: Not on file  . Highest education level: Not on file  Occupational History  . Not on file  Social Needs  . Financial resource strain: Not on file  . Food insecurity:    Worry: Not on file    Inability: Not on file  . Transportation needs:    Medical: Not on file    Non-medical: Not on file  Tobacco Use  . Smoking status: Never Smoker  . Smokeless tobacco: Never Used  Substance and Sexual Activity  . Alcohol use: No  . Drug use: No  . Sexual activity: Not on file  Lifestyle  . Physical activity:    Days per week: Not on file    Minutes per session: Not on file  . Stress: Not on file  Relationships  . Social connections:    Talks on phone: Not on file    Gets together: Not on file    Attends religious service: Not on file    Active member of club or organization: Not on file    Attends meetings of  clubs or organizations: Not on file    Relationship status: Not on file  Other Topics Concern  . Not on file  Social History Narrative  . Not on file   Outpatient Encounter Medications as of 05/05/2018  Medication Sig  . albuterol (PROVENTIL HFA;VENTOLIN HFA) 108 (90 Base) MCG/ACT inhaler Inhale into the lungs every 6 (six) hours as needed for wheezing or shortness of breath.  Marland Kitchen amLODipine-olmesartan (AZOR) 10-40 MG tablet Take 1 tablet by mouth daily.  Marland Kitchen aspirin EC 81 MG tablet Take 81 mg by mouth at bedtime.  . Blood Glucose Monitoring Suppl (ACCU-CHEK AVIVA PLUS) w/Device KIT TEST FOUR TIMES DAILY  . budesonide-formoterol (SYMBICORT) 160-4.5 MCG/ACT inhaler Inhale 2 puffs into the lungs 2 (two) times daily.  . capsaicin (ZOSTRIX) 0.025 % cream Apply topically 2 (two) times daily.  . cloNIDine (CATAPRES) 0.2 MG tablet Take 0.2 mg by mouth 2 (two) times daily.   . cycloSPORINE (RESTASIS) 0.05 % ophthalmic emulsion Place 1 drop into both eyes daily.  Marland Kitchen EPINEPHrine (EPI-PEN) 0.3 mg/0.3 mL DEVI Inject 0.3 mg into the muscle once.  Marland Kitchen FLUoxetine (PROZAC) 20 MG capsule Take 20 mg by mouth every morning.   . gabapentin (NEURONTIN) 100 MG capsule TAKE  1 CAPSULE TWICE DAILY  . Insulin Glargine (LANTUS SOLOSTAR) 100 UNIT/ML Solostar Pen INJECT  40 UNITS SUBCUTANEOUSLY  AT  10PM  . levothyroxine (SYNTHROID, LEVOTHROID) 137 MCG tablet TAKE 1 TABLET EVERY DAY BEFORE BREAKFAST  . NOVOLOG FLEXPEN 100 UNIT/ML FlexPen INJECT 15 TO 21 UNITS INTO THE SKIN THREE TIMES DAILY WITH MEALS. (Patient taking differently: 10-16 Units. )  . simvastatin (ZOCOR) 40 MG tablet TAKE 1 TABLET EVERY DAY  . topiramate (TOPAMAX) 100 MG tablet Take 1 tablet by mouth 2 (two) times daily.  . ziprasidone (GEODON) 80 MG capsule Take 80 mg by mouth at bedtime.    Marland Kitchen zolpidem (AMBIEN) 5 MG tablet Take 5 mg by mouth at bedtime as needed for sleep.  . [DISCONTINUED] Insulin Glargine (LANTUS SOLOSTAR) 100 UNIT/ML Solostar Pen INJECT  50  UNITS SUBCUTANEOUSLY  AT  10PM   No facility-administered encounter medications on file as of 05/05/2018.    ALLERGIES: Allergies  Allergen Reactions  . Bee Venom Anaphylaxis  . Ace Inhibitors   . Penicillins Hives    .Has patient had a PCN reaction causing immediate rash, facial/tongue/throat swelling, SOB or lightheadedness with hypotension: Yes Has patient had a PCN reaction causing severe rash involving mucus membranes or skin necrosis: No Has patient had a PCN reaction that required hospitalization: Yes Has patient had a PCN reaction occurring within the last 10 years: No If all of the above answers are "NO", then may proceed with Cephalosporin use.    VACCINATION STATUS:  There is no immunization history on file for this patient.  Diabetes  She presents for her follow-up diabetic visit. She has type 2 diabetes mellitus. Onset time: she was diagnosed at approximate age of 20 years. Her disease course has been improving. There are no hypoglycemic associated symptoms. Pertinent negatives for hypoglycemia include no confusion, headaches, pallor or seizures. Pertinent negatives for diabetes include no chest pain, no fatigue, no polydipsia, no polyphagia and no polyuria. There are no hypoglycemic complications. Symptoms are improving. Diabetic complications include autonomic neuropathy. Risk factors for coronary artery disease include diabetes mellitus, dyslipidemia, obesity and sedentary lifestyle. Her weight is fluctuating minimally. She is following a generally unhealthy diet. She has not had a previous visit with a dietitian. She never participates in exercise. Her home blood glucose trend is decreasing steadily. Her breakfast blood glucose range is generally 140-180 mg/dl. Her lunch blood glucose range is generally 140-180 mg/dl. Her dinner blood glucose range is generally 140-180 mg/dl. Her bedtime blood glucose range is generally 140-180 mg/dl. Her overall blood glucose range is 140-180  mg/dl. An ACE inhibitor/angiotensin II receptor blocker is being taken.  Thyroid Problem  Presents for follow-up (She is currently on levothyroxine 137 mcg p.o. nightly.  She reports compliance with medication.) visit. Patient reports no cold intolerance, diarrhea, fatigue, heat intolerance or palpitations. The symptoms have been stable. Past treatments include levothyroxine. Her past medical history is significant for diabetes and hyperlipidemia.  Hyperlipidemia  This is a chronic problem. The current episode started more than 1 year ago. Exacerbating diseases include diabetes and obesity. Pertinent negatives include no chest pain, myalgias or shortness of breath. Current antihyperlipidemic treatment includes statins. Risk factors for coronary artery disease include dyslipidemia, diabetes mellitus, hypertension, obesity, a sedentary lifestyle and post-menopausal.  Hypertension  This is a chronic problem. The current episode started more than 1 year ago. The problem is uncontrolled (Patient's pressure this morning was extremely high reading 243/81 and repeat measurement was 241/72.  Patient is asymptomatic.  Patient was sent with a note to emergency room for evaluation and treatment.). Pertinent negatives include no chest pain, headaches, palpitations or shortness of breath. Risk factors for coronary artery disease include dyslipidemia, diabetes mellitus, obesity, post-menopausal state and sedentary lifestyle. Past treatments include angiotensin blockers, calcium channel blockers and central alpha agonists. Identifiable causes of hypertension include a thyroid problem.     Review of Systems  Constitutional: Negative for fatigue and unexpected weight change.  HENT: Negative for trouble swallowing and voice change.   Eyes: Negative for visual disturbance.  Respiratory: Negative for cough, shortness of breath and wheezing.   Cardiovascular: Negative for chest pain, palpitations and leg swelling.   Gastrointestinal: Negative for diarrhea, nausea and vomiting.  Endocrine: Negative for cold intolerance, heat intolerance, polydipsia, polyphagia and polyuria.  Musculoskeletal: Negative for arthralgias and myalgias.  Skin: Negative for color change, pallor, rash and wound.  Neurological: Negative for seizures and headaches.  Psychiatric/Behavioral: Negative for confusion and suicidal ideas.    Objective:    BP (!) 241/74   Ht 5' 4" (1.626 m)   Wt 215 lb 12.8 oz (97.9 kg)   BMI 37.04 kg/m   Wt Readings from Last 3 Encounters:  05/05/18 215 lb 12.8 oz (97.9 kg)  05/05/18 215 lb 12.8 oz (97.9 kg)  12/17/17 223 lb (101.2 kg)    Physical Exam  Constitutional: She is oriented to person, place, and time. She appears well-developed.  HENT:  Head: Normocephalic and atraumatic.  Eyes: EOM are normal.  Neck: Normal range of motion. Neck supple. No tracheal deviation present. No thyromegaly present.  Cardiovascular: Normal rate.  Pulmonary/Chest: Effort normal.  Abdominal: There is no abdominal tenderness. There is no guarding.  Musculoskeletal: Normal range of motion.        General: No edema.  Neurological: She is alert and oriented to person, place, and time. No cranial nerve deficit. Coordination normal.  Skin: Skin is warm and dry. No rash noted. No erythema. No pallor.  Psychiatric: She has a normal mood and affect. Judgment normal.    Results for orders placed or performed in visit on 12/17/17  Hemoglobin A1c  Result Value Ref Range   Hgb A1c MFr Bld 6.3 (H) <5.7 % of total Hgb   Mean Plasma Glucose 134 (calc)   eAG (mmol/L) 7.4 (calc)  COMPLETE METABOLIC PANEL WITH GFR  Result Value Ref Range   Glucose, Bld 69 65 - 139 mg/dL   BUN 20 7 - 25 mg/dL   Creat 1.92 (H) 0.60 - 0.93 mg/dL   GFR, Est Non African American 25 (L) > OR = 60 mL/min/1.7m   GFR, Est African American 29 (L) > OR = 60 mL/min/1.772m  BUN/Creatinine Ratio 10 6 - 22 (calc)   Sodium 150 (H) 135 - 146  mmol/L   Potassium 3.7 3.5 - 5.3 mmol/L   Chloride 112 (H) 98 - 110 mmol/L   CO2 29 20 - 32 mmol/L   Calcium 9.2 8.6 - 10.4 mg/dL   Total Protein 5.8 (L) 6.1 - 8.1 g/dL   Albumin 3.5 (L) 3.6 - 5.1 g/dL   Globulin 2.3 1.9 - 3.7 g/dL (calc)   AG Ratio 1.5 1.0 - 2.5 (calc)   Total Bilirubin 0.3 0.2 - 1.2 mg/dL   Alkaline phosphatase (APISO) 126 37 - 153 U/L   AST 23 10 - 35 U/L   ALT 25 6 - 29 U/L   Diabetic Labs (most recent): Lab Results  Component Value Date  HGBA1C 6.3 (H) 04/28/2018   HGBA1C 7.3 (H) 12/14/2017   HGBA1C 7.6 (H) 09/15/2017   Lipid Panel     Component Value Date/Time   CHOL 189 12/14/2017 0921   TRIG 221 (H) 12/14/2017 0921   HDL 51 12/14/2017 0921   CHOLHDL 3.7 12/14/2017 0921   LDLCALC 105 (H) 12/14/2017 0921     Assessment & Plan:   1. Uncontrolled type 2 diabetes mellitus with complication, with long-term current use of insulin (HCC)  Her diabetes is  complicated by neuropathy , microalbuminuria, worsening CKD,  and patient remains at a high risk for more acute and chronic complications of diabetes which include CAD, CVA, CKD, retinopathy, and neuropathy. These are all discussed in detail with the patient.  She returns with improved A1c of 6.3%, progressively improving from 9%.      Glucose logs and insulin administration records pertaining to this visit,  to be scanned into patient's records.  Recent labs reviewed.   - I have re-counseled the patient on diet management and weight loss  by adopting a carbohydrate restricted / protein rich  Diet.  - Patient admits there is a room for improvement in her diet and drink choices. -  Suggestion is made for her to avoid simple carbohydrates  from her diet including Cakes, Sweet Desserts / Pastries, Ice Cream, Soda (diet and regular), Sweet Tea, Candies, Chips, Cookies, Store Bought Juices, Alcohol in Excess of  1-2 drinks a day, Artificial Sweeteners, and "Sugar-free" Products. This will help patient to  have stable blood glucose profile and potentially avoid unintended weight gain.  - Patient is advised to stick to a routine mealtimes to eat 3 meals  a day and avoid unnecessary snacks ( to snack only to correct hypoglycemia).  - I have approached patient with the following individualized plan to manage diabetes and patient agrees.  -She presents with better engagement for monitoring and documentation of her insulin activities.  -She is advised to lower her Lantus to 40 units nightly, continue NovoLog 10 units ( only during meals)  3 times a day before meals plus correction. I discussed the errors she made dosing NovoLog .  -She is advised to discontinue metformin due to declining renal function.  This is likely secondary to uncontrolled hypertension.  See below.  -  Adjustment  parameters are given for hypo and hyperglycemia in writing. -Patient is encouraged to call clinic for blood glucose levels less than 70 or above 300 mg /dl.  - Patient specific target  for A1c; LDL, HDL, Triglycerides, and  Waist Circumference were discussed in detail.  2) BP/HTN: Patient's blood pressure this morning measured 241/72, repeat was 243/81.  She is sent to the emergency room with a note for better evaluation and treatment.  She has enough antihypertensive medications including amlodipine, olmesartan, clonidine.     3) Lipids/HPL: Her recent lipid panel showed improved LDL at 105 from 127.  She is advised to continue simvastatin 40 mg p.o. nightly.  Side effects and precautions discussed with her.   4)  Weight/Diet: CDE consult in progress, exercise, and carbohydrates information provided.  5) hypothyroidism: -Her previsit thyroid function tests are consistent with appropriate replacement.  She is advised to continue levothyroxine 137 mcg p.o. daily before breakfast.     - We discussed about the correct intake of her thyroid hormone, on empty stomach at fasting, with water, separated by at least 30  minutes from breakfast and other medications,  and separated by more than  4 hours from calcium, iron, multivitamins, acid reflux medications (PPIs). -Patient is made aware of the fact that thyroid hormone replacement is needed for life, dose to be adjusted by periodic monitoring of thyroid function tests.  6) Chronic Care/Health Maintenance:  -Patient is on ACEI/ARB and Statin medications and encouraged to continue to follow up with Ophthalmology, Podiatrist at least yearly or according to recommendations, and advised to  stay away from smoking. I have recommended yearly flu vaccine and pneumonia vaccination at least every 5 years; moderate intensity exercise for up to 150 minutes weekly; and  sleep for at least 7 hours a day.  I advised patient to maintain close follow up with her PCP for primary care needs.  - Time spent with the patient: 25 min, of which >50% was spent in reviewing her blood glucose logs , discussing her hypoglycemia and hyperglycemia episodes, reviewing her current and  previous labs / studies and medications  doses and developing a plan to avoid hypoglycemia and hyperglycemia. Please refer to Patient Instructions for Blood Glucose Monitoring and Insulin/Medications Dosing Guide"  in media tab for additional information. Holly Baldwin participated in the discussions, expressed understanding, and voiced agreement with the above plans.  All questions were answered to her satisfaction. she is encouraged to contact clinic should she have any questions or concerns prior to her return visit.  Follow up plan: Return in about 2 weeks (around 05/19/2018), or TO ER , DO NOT GO HOME., for Follow up with Meter and Logs Only - no Labs.  Glade Lloyd, MD Phone: [phone]  Fax: [phone]  This note was partially dictated with voice recognition software. Similar sounding words can be transcribed inadequately or may not  be corrected upon review.  05/05/2018, 1:09 PM

## 2018-05-05 NOTE — Discharge Instructions (Addendum)
You were evaluated in the Emergency Department and after careful evaluation, we did not find any emergent condition requiring admission or further testing in the hospital.  You have asymptomatic hypertension, meaning that you have high blood pressure but it is not causing you to have any symptoms.  Your exam and your blood tests today are reassuring.  This blood pressure will need to be managed closely by your regular doctor.  Please return to the Emergency Department if you experience any worsening of your condition.  We encourage you to follow up with a primary care provider.  Thank you for allowing [place] to be a part of your care.

## 2018-05-05 NOTE — ED Provider Notes (Signed)
[facility] Emergency Department Provider Note MRN:  [id number]  Arrival date & time: 05/05/18     Chief Complaint   Hypertension   History of Present Illness   Holly Baldwin is a 73 y.o. year-old female with a history of hypertension, diabetes presenting to the ED with chief complaint of hypertension.  Patient sent here from PCPs office, report of blood pressures in the 671I systolic during her routine follow-up visit today for her diabetes.  Patient is asymptomatic, no headache, no vision change, no numbness weakness to the arms or legs, no chest pain or shortness of breath, no abdominal pain.  Patient explains she had a headache yesterday, which is common for her, gradual onset, lasting only a few hours.  Denies fever, no cold-like symptoms.  No recent changes to her blood pressure medications.  Took her medications this morning.  Review of Systems  A complete 10 system review of systems was obtained and all systems are negative except as noted in the HPI and PMH.   Patient's Health History    Past Medical History:  Diagnosis Date  . Anxiety   . Chronic abdominal pain   . Diabetes mellitus   . GERD (gastroesophageal reflux disease)   . Hiatal hernia   . Hypercholesteremia   . Hypertension     Past Surgical History:  Procedure Laterality Date  . ABDOMINAL HYSTERECTOMY    . COLONOSCOPY N/A 06/23/2012   Procedure: COLONOSCOPY;  Surgeon: Rogene Houston, MD;  [facility];  Service: Endoscopy;  Laterality: N/A;  100-moved to 1200 Ann to notify pt  . ESOPHAGOGASTRODUODENOSCOPY N/A 05/06/2012   Procedure: ESOPHAGOGASTRODUODENOSCOPY (EGD);  Surgeon: Rogene Houston, MD;  [facility];  Service: Endoscopy;  Laterality: N/A;  . MASS EXCISION Right 05/26/2012   Procedure: EXCISION NEOPLASM RIGHT THIGH;  Surgeon: Jamesetta So, MD;  Location: [facility];  Service: General;  Laterality: Right;    Family History  Problem Relation Age of Onset  .  Diabetes Mother   . Diabetes Father   . Diabetes Sister   . Diabetes Brother   . Colon cancer Neg Hx   . Colon polyps Neg Hx     Social History   Socioeconomic History  . Marital status: Married    Spouse name: Not on file  . Number of children: Not on file  . Years of education: Not on file  . Highest education level: Not on file  Occupational History  . Not on file  Social Needs  . Financial resource strain: Not on file  . Food insecurity:    Worry: Not on file    Inability: Not on file  . Transportation needs:    Medical: Not on file    Non-medical: Not on file  Tobacco Use  . Smoking status: Never Smoker  . Smokeless tobacco: Never Used  Substance and Sexual Activity  . Alcohol use: No  . Drug use: No  . Sexual activity: Not on file  Lifestyle  . Physical activity:    Days per week: Not on file    Minutes per session: Not on file  . Stress: Not on file  Relationships  . Social connections:    Talks on phone: Not on file    Gets together: Not on file    Attends religious service: Not on file    Active member of club or organization: Not on file    Attends meetings of clubs or organizations: Not  on file    Relationship status: Not on file  . Intimate partner violence:    Fear of current or ex partner: Not on file    Emotionally abused: Not on file    Physically abused: Not on file    Forced sexual activity: Not on file  Other Topics Concern  . Not on file  Social History Narrative  . Not on file     Physical Exam  Vital Signs and Nursing Notes reviewed Vitals:   05/05/18 1233 05/05/18 1238  BP: (!) 232/71 (!) 232/71  Pulse:  72  Resp:  18  Temp:    SpO2:  96%    CONSTITUTIONAL: Well-appearing, NAD NEURO:  Alert and oriented x 3, no focal deficits EYES:  eyes equal and reactive ENT/NECK:  no LAD, no JVD CARDIO: Regular rate, well-perfused, normal S1 and S2 PULM:  CTAB no wheezing or rhonchi GI/GU:  normal bowel sounds, non-distended,  non-tender MSK/SPINE:  No gross deformities, no edema SKIN:  no rash, atraumatic PSYCH:  Appropriate speech and behavior  Diagnostic and Interventional Summary    EKG Interpretation  Date/Time:  Wednesday May 05 2018 11:10:03 EST Ventricular Rate:  66 PR Interval:  180 QRS Duration: 92 QT Interval:  476 QTC Calculation: 499 R Axis:   55 Text Interpretation:  Normal sinus rhythm Prolonged QT Abnormal ECG Confirmed by Gerlene Fee [id number]) on 05/05/2018 12:22:25 PM      Labs Reviewed  BASIC METABOLIC PANEL - Abnormal; Notable for the following components:      Result Value   Potassium 3.3 (*)    Chloride 112 (*)    Glucose, Bld 199 (*)    Creatinine, Ser 1.69 (*)    GFR calc non Af Amer 30 (*)    GFR calc Af Amer 34 (*)    All other components within normal limits  CBC WITH DIFFERENTIAL/PLATELET    No orders to display    Medications  hydrALAZINE (APRESOLINE) tablet 25 mg (25 mg Oral Given 05/05/18 1233)     Procedures Critical Care  ED Course and Medical Decision Making  I have reviewed the triage vital signs and the nursing notes.  Pertinent labs & imaging results that were available during my care of the patient were reviewed by me and considered in my medical decision making (see below for details).  Asymptomatic hypertension in this 73 year old female sent from PCPs office.  Patient is well-appearing, ambulatory, without complaints, normal neurological exam.  Per chart review, patient did have worsening renal function on her recent blood work on February 26, will repeat today.  Patient's heart rate in triage noted to be 36, this is felt to be inaccurate, heart rate around 65 during my evaluation.  Patient asymptomatic during her entire ED visit.  Labs are reassuring, with improvement of her kidney function compared to prior.  No indication for admission or further testing.  I attempted to call Dr. Dorris Fetch to get a better understanding of why she was sent to the emergency  department and to discuss or ensure prompt follow-up, unable to be reached.  Advised patient to call or visit the office tomorrow.  After the discussed management above, the patient was determined to be safe for discharge.  The patient was in agreement with this plan and all questions regarding their care were answered.  ED return precautions were discussed and the patient will return to the ED with any significant worsening of condition.  Barth Kirks. Sedonia Small, [facility]  [place] [email or web address]  Final Clinical Impressions(s) / ED Diagnoses     ICD-10-CM   1. Hypertension, unspecified type I10     ED Discharge Orders    None         Maudie Flakes, MD 05/05/18 1450

## 2018-05-05 NOTE — Progress Notes (Signed)
LeighAnn Ravon [place], CMA  

## 2018-05-05 NOTE — ED Triage Notes (Addendum)
Pt reports that she was sent here by Dr Dorris Fetch due to Blood pressure. Pt was in office for follow up diabetes.  BP in office 241/72and 243/81. Pt report she took her meds today Pt reports HA yesterday, but no pain today

## 2018-05-05 NOTE — ED Triage Notes (Signed)
Checked on patient in waiting area. NAD noted. Delay explained to patient.

## 2018-05-05 NOTE — ED Notes (Signed)
Notified Bero, MD of pt's BP

## 2018-05-11 DIAGNOSIS — E039 Hypothyroidism, unspecified: Secondary | ICD-10-CM | POA: Diagnosis not present

## 2018-05-11 DIAGNOSIS — N183 Chronic kidney disease, stage 3 (moderate): Secondary | ICD-10-CM | POA: Diagnosis not present

## 2018-05-11 DIAGNOSIS — Z1389 Encounter for screening for other disorder: Secondary | ICD-10-CM | POA: Diagnosis not present

## 2018-05-11 DIAGNOSIS — Z0001 Encounter for general adult medical examination with abnormal findings: Secondary | ICD-10-CM | POA: Diagnosis not present

## 2018-05-11 DIAGNOSIS — F0390 Unspecified dementia without behavioral disturbance: Secondary | ICD-10-CM | POA: Diagnosis not present

## 2018-05-11 DIAGNOSIS — E119 Type 2 diabetes mellitus without complications: Secondary | ICD-10-CM | POA: Diagnosis not present

## 2018-05-11 DIAGNOSIS — I10 Essential (primary) hypertension: Secondary | ICD-10-CM | POA: Diagnosis not present

## 2018-05-11 DIAGNOSIS — Z6836 Body mass index (BMI) 36.0-36.9, adult: Secondary | ICD-10-CM | POA: Diagnosis not present

## 2018-05-12 ENCOUNTER — Encounter (INDEPENDENT_AMBULATORY_CARE_PROVIDER_SITE_OTHER): Payer: Medicare HMO | Admitting: Ophthalmology

## 2018-05-12 ENCOUNTER — Other Ambulatory Visit: Payer: Self-pay

## 2018-05-12 DIAGNOSIS — E113592 Type 2 diabetes mellitus with proliferative diabetic retinopathy without macular edema, left eye: Secondary | ICD-10-CM

## 2018-05-12 DIAGNOSIS — I10 Essential (primary) hypertension: Secondary | ICD-10-CM

## 2018-05-12 DIAGNOSIS — E113311 Type 2 diabetes mellitus with moderate nonproliferative diabetic retinopathy with macular edema, right eye: Secondary | ICD-10-CM

## 2018-05-12 DIAGNOSIS — H43813 Vitreous degeneration, bilateral: Secondary | ICD-10-CM

## 2018-05-12 DIAGNOSIS — E11311 Type 2 diabetes mellitus with unspecified diabetic retinopathy with macular edema: Secondary | ICD-10-CM | POA: Diagnosis not present

## 2018-05-12 DIAGNOSIS — H35033 Hypertensive retinopathy, bilateral: Secondary | ICD-10-CM

## 2018-06-07 ENCOUNTER — Other Ambulatory Visit: Payer: Self-pay | Admitting: "Endocrinology

## 2018-06-10 ENCOUNTER — Other Ambulatory Visit: Payer: Self-pay

## 2018-06-10 MED ORDER — LEVOTHYROXINE SODIUM 137 MCG PO TABS: 137.0000 ug | ORAL_TABLET | Freq: Every day | ORAL | 0 refills | Status: DC

## 2018-06-17 ENCOUNTER — Encounter (INDEPENDENT_AMBULATORY_CARE_PROVIDER_SITE_OTHER): Payer: Medicare HMO | Admitting: Ophthalmology

## 2018-06-17 ENCOUNTER — Other Ambulatory Visit: Payer: Self-pay

## 2018-06-17 DIAGNOSIS — I10 Essential (primary) hypertension: Secondary | ICD-10-CM | POA: Diagnosis not present

## 2018-06-17 DIAGNOSIS — H35033 Hypertensive retinopathy, bilateral: Secondary | ICD-10-CM

## 2018-06-17 DIAGNOSIS — H2513 Age-related nuclear cataract, bilateral: Secondary | ICD-10-CM | POA: Diagnosis not present

## 2018-06-17 DIAGNOSIS — E11311 Type 2 diabetes mellitus with unspecified diabetic retinopathy with macular edema: Secondary | ICD-10-CM | POA: Diagnosis not present

## 2018-06-17 DIAGNOSIS — E113311 Type 2 diabetes mellitus with moderate nonproliferative diabetic retinopathy with macular edema, right eye: Secondary | ICD-10-CM

## 2018-06-17 DIAGNOSIS — H43813 Vitreous degeneration, bilateral: Secondary | ICD-10-CM | POA: Diagnosis not present

## 2018-06-17 DIAGNOSIS — E113512 Type 2 diabetes mellitus with proliferative diabetic retinopathy with macular edema, left eye: Secondary | ICD-10-CM

## 2018-07-15 ENCOUNTER — Other Ambulatory Visit: Payer: Self-pay

## 2018-07-15 ENCOUNTER — Encounter (INDEPENDENT_AMBULATORY_CARE_PROVIDER_SITE_OTHER): Payer: Medicare HMO | Admitting: Ophthalmology

## 2018-07-15 DIAGNOSIS — H2513 Age-related nuclear cataract, bilateral: Secondary | ICD-10-CM | POA: Diagnosis not present

## 2018-07-15 DIAGNOSIS — E113512 Type 2 diabetes mellitus with proliferative diabetic retinopathy with macular edema, left eye: Secondary | ICD-10-CM | POA: Diagnosis not present

## 2018-07-15 DIAGNOSIS — I10 Essential (primary) hypertension: Secondary | ICD-10-CM

## 2018-07-15 DIAGNOSIS — H35033 Hypertensive retinopathy, bilateral: Secondary | ICD-10-CM | POA: Diagnosis not present

## 2018-07-15 DIAGNOSIS — E11311 Type 2 diabetes mellitus with unspecified diabetic retinopathy with macular edema: Secondary | ICD-10-CM

## 2018-07-15 DIAGNOSIS — E113311 Type 2 diabetes mellitus with moderate nonproliferative diabetic retinopathy with macular edema, right eye: Secondary | ICD-10-CM

## 2018-07-15 DIAGNOSIS — H43813 Vitreous degeneration, bilateral: Secondary | ICD-10-CM

## 2018-08-12 ENCOUNTER — Encounter (INDEPENDENT_AMBULATORY_CARE_PROVIDER_SITE_OTHER): Payer: Medicare HMO | Admitting: Ophthalmology

## 2018-08-12 ENCOUNTER — Other Ambulatory Visit: Payer: Self-pay

## 2018-08-12 DIAGNOSIS — I10 Essential (primary) hypertension: Secondary | ICD-10-CM

## 2018-08-12 DIAGNOSIS — H35033 Hypertensive retinopathy, bilateral: Secondary | ICD-10-CM

## 2018-08-12 DIAGNOSIS — E113512 Type 2 diabetes mellitus with proliferative diabetic retinopathy with macular edema, left eye: Secondary | ICD-10-CM

## 2018-08-12 DIAGNOSIS — H2513 Age-related nuclear cataract, bilateral: Secondary | ICD-10-CM | POA: Diagnosis not present

## 2018-08-12 DIAGNOSIS — H43813 Vitreous degeneration, bilateral: Secondary | ICD-10-CM | POA: Diagnosis not present

## 2018-08-12 DIAGNOSIS — E11311 Type 2 diabetes mellitus with unspecified diabetic retinopathy with macular edema: Secondary | ICD-10-CM | POA: Diagnosis not present

## 2018-08-12 DIAGNOSIS — E113311 Type 2 diabetes mellitus with moderate nonproliferative diabetic retinopathy with macular edema, right eye: Secondary | ICD-10-CM

## 2018-08-16 DIAGNOSIS — Z6836 Body mass index (BMI) 36.0-36.9, adult: Secondary | ICD-10-CM | POA: Diagnosis not present

## 2018-08-16 DIAGNOSIS — Z1389 Encounter for screening for other disorder: Secondary | ICD-10-CM | POA: Diagnosis not present

## 2018-08-16 DIAGNOSIS — E6609 Other obesity due to excess calories: Secondary | ICD-10-CM | POA: Diagnosis not present

## 2018-08-16 DIAGNOSIS — I10 Essential (primary) hypertension: Secondary | ICD-10-CM | POA: Diagnosis not present

## 2018-08-17 DIAGNOSIS — Z1389 Encounter for screening for other disorder: Secondary | ICD-10-CM | POA: Diagnosis not present

## 2018-08-17 DIAGNOSIS — Z0001 Encounter for general adult medical examination with abnormal findings: Secondary | ICD-10-CM | POA: Diagnosis not present

## 2018-08-17 DIAGNOSIS — Z6836 Body mass index (BMI) 36.0-36.9, adult: Secondary | ICD-10-CM | POA: Diagnosis not present

## 2018-08-17 DIAGNOSIS — E6609 Other obesity due to excess calories: Secondary | ICD-10-CM | POA: Diagnosis not present

## 2018-08-19 ENCOUNTER — Inpatient Hospital Stay (HOSPITAL_COMMUNITY)
Admission: EM | Admit: 2018-08-19 | Discharge: 2018-08-25 | DRG: 291 | Disposition: A | Discharge status: Home / Self Care | Payer: Medicare HMO | Attending: Internal Medicine | Admitting: Internal Medicine

## 2018-08-19 ENCOUNTER — Telehealth: Payer: Self-pay | Admitting: "Endocrinology

## 2018-08-19 ENCOUNTER — Encounter (HOSPITAL_COMMUNITY): Payer: Self-pay | Admitting: Emergency Medicine

## 2018-08-19 ENCOUNTER — Emergency Department (HOSPITAL_COMMUNITY): Payer: Medicare HMO

## 2018-08-19 ENCOUNTER — Other Ambulatory Visit: Payer: Self-pay

## 2018-08-19 DIAGNOSIS — F329 Major depressive disorder, single episode, unspecified: Secondary | ICD-10-CM | POA: Diagnosis present

## 2018-08-19 DIAGNOSIS — R001 Bradycardia, unspecified: Secondary | ICD-10-CM | POA: Diagnosis present

## 2018-08-19 DIAGNOSIS — Z880 Allergy status to penicillin: Secondary | ICD-10-CM

## 2018-08-19 DIAGNOSIS — F419 Anxiety disorder, unspecified: Secondary | ICD-10-CM | POA: Diagnosis present

## 2018-08-19 DIAGNOSIS — E785 Hyperlipidemia, unspecified: Secondary | ICD-10-CM | POA: Diagnosis present

## 2018-08-19 DIAGNOSIS — E1140 Type 2 diabetes mellitus with diabetic neuropathy, unspecified: Secondary | ICD-10-CM | POA: Diagnosis present

## 2018-08-19 DIAGNOSIS — D631 Anemia in chronic kidney disease: Secondary | ICD-10-CM | POA: Diagnosis present

## 2018-08-19 DIAGNOSIS — E1122 Type 2 diabetes mellitus with diabetic chronic kidney disease: Secondary | ICD-10-CM | POA: Diagnosis present

## 2018-08-19 DIAGNOSIS — Z6837 Body mass index (BMI) 37.0-37.9, adult: Secondary | ICD-10-CM

## 2018-08-19 DIAGNOSIS — I130 Hypertensive heart and chronic kidney disease with heart failure and stage 1 through stage 4 chronic kidney disease, or unspecified chronic kidney disease: Principal | ICD-10-CM | POA: Diagnosis present

## 2018-08-19 DIAGNOSIS — N179 Acute kidney failure, unspecified: Secondary | ICD-10-CM | POA: Diagnosis present

## 2018-08-19 DIAGNOSIS — I161 Hypertensive emergency: Secondary | ICD-10-CM | POA: Diagnosis present

## 2018-08-19 DIAGNOSIS — K219 Gastro-esophageal reflux disease without esophagitis: Secondary | ICD-10-CM | POA: Diagnosis present

## 2018-08-19 DIAGNOSIS — I5033 Acute on chronic diastolic (congestive) heart failure: Secondary | ICD-10-CM

## 2018-08-19 DIAGNOSIS — Z90710 Acquired absence of both cervix and uterus: Secondary | ICD-10-CM | POA: Diagnosis not present

## 2018-08-19 DIAGNOSIS — E118 Type 2 diabetes mellitus with unspecified complications: Secondary | ICD-10-CM | POA: Diagnosis not present

## 2018-08-19 DIAGNOSIS — Z79890 Hormone replacement therapy (postmenopausal): Secondary | ICD-10-CM

## 2018-08-19 DIAGNOSIS — Z7951 Long term (current) use of inhaled steroids: Secondary | ICD-10-CM

## 2018-08-19 DIAGNOSIS — Z20828 Contact with and (suspected) exposure to other viral communicable diseases: Secondary | ICD-10-CM | POA: Diagnosis present

## 2018-08-19 DIAGNOSIS — Y929 Unspecified place or not applicable: Secondary | ICD-10-CM

## 2018-08-19 DIAGNOSIS — N183 Chronic kidney disease, stage 3 unspecified: Secondary | ICD-10-CM

## 2018-08-19 DIAGNOSIS — I5031 Acute diastolic (congestive) heart failure: Secondary | ICD-10-CM | POA: Diagnosis not present

## 2018-08-19 DIAGNOSIS — Z794 Long term (current) use of insulin: Secondary | ICD-10-CM | POA: Diagnosis not present

## 2018-08-19 DIAGNOSIS — Z91030 Bee allergy status: Secondary | ICD-10-CM | POA: Diagnosis not present

## 2018-08-19 DIAGNOSIS — I10 Essential (primary) hypertension: Secondary | ICD-10-CM

## 2018-08-19 DIAGNOSIS — K5900 Constipation, unspecified: Secondary | ICD-10-CM | POA: Diagnosis not present

## 2018-08-19 DIAGNOSIS — J9601 Acute respiratory failure with hypoxia: Secondary | ICD-10-CM | POA: Diagnosis present

## 2018-08-19 DIAGNOSIS — E876 Hypokalemia: Secondary | ICD-10-CM | POA: Diagnosis present

## 2018-08-19 DIAGNOSIS — E039 Hypothyroidism, unspecified: Secondary | ICD-10-CM | POA: Diagnosis present

## 2018-08-19 DIAGNOSIS — R945 Abnormal results of liver function studies: Secondary | ICD-10-CM | POA: Diagnosis not present

## 2018-08-19 DIAGNOSIS — D649 Anemia, unspecified: Secondary | ICD-10-CM | POA: Diagnosis present

## 2018-08-19 DIAGNOSIS — I509 Heart failure, unspecified: Secondary | ICD-10-CM | POA: Diagnosis not present

## 2018-08-19 DIAGNOSIS — R0602 Shortness of breath: Secondary | ICD-10-CM | POA: Diagnosis not present

## 2018-08-19 DIAGNOSIS — Z888 Allergy status to other drugs, medicaments and biological substances status: Secondary | ICD-10-CM

## 2018-08-19 DIAGNOSIS — Z79899 Other long term (current) drug therapy: Secondary | ICD-10-CM

## 2018-08-19 DIAGNOSIS — T461X5A Adverse effect of calcium-channel blockers, initial encounter: Secondary | ICD-10-CM | POA: Diagnosis present

## 2018-08-19 DIAGNOSIS — E1165 Type 2 diabetes mellitus with hyperglycemia: Secondary | ICD-10-CM | POA: Diagnosis not present

## 2018-08-19 DIAGNOSIS — E7800 Pure hypercholesterolemia, unspecified: Secondary | ICD-10-CM | POA: Diagnosis present

## 2018-08-19 DIAGNOSIS — E119 Type 2 diabetes mellitus without complications: Secondary | ICD-10-CM | POA: Diagnosis not present

## 2018-08-19 DIAGNOSIS — J449 Chronic obstructive pulmonary disease, unspecified: Secondary | ICD-10-CM | POA: Diagnosis present

## 2018-08-19 DIAGNOSIS — Z7982 Long term (current) use of aspirin: Secondary | ICD-10-CM

## 2018-08-19 DIAGNOSIS — D509 Iron deficiency anemia, unspecified: Secondary | ICD-10-CM | POA: Diagnosis present

## 2018-08-19 DIAGNOSIS — IMO0002 Reserved for concepts with insufficient information to code with codable children: Secondary | ICD-10-CM

## 2018-08-19 DIAGNOSIS — Z833 Family history of diabetes mellitus: Secondary | ICD-10-CM

## 2018-08-19 LAB — CBC WITH DIFFERENTIAL/PLATELET
Abs Immature Granulocytes: 0.05 10*3/uL (ref 0.00–0.07)
Basophils Absolute: 0.1 10*3/uL (ref 0.0–0.1)
Basophils Relative: 1 %
Eosinophils Absolute: 0.2 10*3/uL (ref 0.0–0.5)
Eosinophils Relative: 2 %
HCT: 34.8 % — ABNORMAL LOW (ref 36.0–46.0)
Hemoglobin: 10.7 g/dL — ABNORMAL LOW (ref 12.0–15.0)
Immature Granulocytes: 0 %
Lymphocytes Relative: 12 %
Lymphs Abs: 1.5 10*3/uL (ref 0.7–4.0)
MCH: 26.5 pg (ref 26.0–34.0)
MCHC: 30.7 g/dL (ref 30.0–36.0)
MCV: 86.1 fL (ref 80.0–100.0)
Monocytes Absolute: 1.1 10*3/uL — ABNORMAL HIGH (ref 0.1–1.0)
Monocytes Relative: 8 %
Neutro Abs: 9.7 10*3/uL — ABNORMAL HIGH (ref 1.7–7.7)
Neutrophils Relative %: 77 %
Platelets: 276 10*3/uL (ref 150–400)
RBC: 4.04 MIL/uL (ref 3.87–5.11)
RDW: 15.9 % — ABNORMAL HIGH (ref 11.5–15.5)
WBC: 12.5 10*3/uL — ABNORMAL HIGH (ref 4.0–10.5)
nRBC: 0 % (ref 0.0–0.2)

## 2018-08-19 LAB — COMPREHENSIVE METABOLIC PANEL
ALT: 59 U/L — ABNORMAL HIGH (ref 0–44)
AST: 38 U/L (ref 15–41)
Albumin: 3.1 g/dL — ABNORMAL LOW (ref 3.5–5.0)
Alkaline Phosphatase: 155 U/L — ABNORMAL HIGH (ref 38–126)
Anion gap: 13 (ref 5–15)
BUN: 26 mg/dL — ABNORMAL HIGH (ref 8–23)
CO2: 24 mmol/L (ref 22–32)
Calcium: 8.5 mg/dL — ABNORMAL LOW (ref 8.9–10.3)
Chloride: 104 mmol/L (ref 98–111)
Creatinine, Ser: 2.09 mg/dL — ABNORMAL HIGH (ref 0.44–1.00)
GFR calc Af Amer: 26 mL/min — ABNORMAL LOW (ref >60)
GFR calc non Af Amer: 23 mL/min — ABNORMAL LOW (ref >60)
Glucose, Bld: 241 mg/dL — ABNORMAL HIGH (ref 70–99)
Potassium: 3.2 mmol/L — ABNORMAL LOW (ref 3.5–5.1)
Sodium: 141 mmol/L (ref 135–145)
Total Bilirubin: 0.2 mg/dL — ABNORMAL LOW (ref 0.3–1.2)
Total Protein: 6.4 g/dL — ABNORMAL LOW (ref 6.5–8.1)

## 2018-08-19 LAB — BLOOD GAS, ARTERIAL
Acid-Base Excess: 2.3 mmol/L — ABNORMAL HIGH (ref 0.0–2.0)
Bicarbonate: 25.8 mmol/L (ref 20.0–28.0)
FIO2: 32
O2 Saturation: 92 %
Patient temperature: 37
pCO2 arterial: 50.4 mmHg — ABNORMAL HIGH (ref 32.0–48.0)
pH, Arterial: 7.353 (ref 7.350–7.450)
pO2, Arterial: 67.5 mmHg — ABNORMAL LOW (ref 83.0–108.0)

## 2018-08-19 LAB — BRAIN NATRIURETIC PEPTIDE: B Natriuretic Peptide: 416 pg/mL — ABNORMAL HIGH (ref 0.0–100.0)

## 2018-08-19 LAB — LIPASE, BLOOD: Lipase: 24 U/L (ref 11–51)

## 2018-08-19 LAB — SARS CORONAVIRUS 2 BY RT PCR (HOSPITAL ORDER, PERFORMED IN ~~LOC~~ HOSPITAL LAB): SARS Coronavirus 2: NEGATIVE

## 2018-08-19 LAB — TROPONIN I: Troponin I: 0.03 ng/mL (ref <0.03)

## 2018-08-19 MED ORDER — FUROSEMIDE 10 MG/ML IJ SOLN
40.0000 mg | Freq: Once | INTRAMUSCULAR | Status: AC
  Administered 2018-08-19: 40 mg via INTRAVENOUS

## 2018-08-19 MED ORDER — METHYLPREDNISOLONE SODIUM SUCC 125 MG IJ SOLR
125.0000 mg | Freq: Once | INTRAMUSCULAR | Status: AC
  Administered 2018-08-19: 125 mg via INTRAVENOUS
  Filled 2018-08-19: qty 2

## 2018-08-19 MED ORDER — HYDRALAZINE HCL 20 MG/ML IJ SOLN
10.0000 mg | Freq: Once | INTRAMUSCULAR | Status: AC
  Administered 2018-08-19: 10 mg via INTRAVENOUS
  Filled 2018-08-19: qty 1

## 2018-08-19 MED ORDER — IPRATROPIUM BROMIDE 0.02 % IN SOLN
0.5000 mg | Freq: Once | RESPIRATORY_TRACT | Status: AC
  Administered 2018-08-20: 0.5 mg via RESPIRATORY_TRACT
  Filled 2018-08-19: qty 2.5

## 2018-08-19 MED ORDER — SODIUM CHLORIDE 0.9 % IV SOLN
INTRAVENOUS | Status: DC | PRN
  Administered 2018-08-19: via INTRAVENOUS

## 2018-08-19 MED ORDER — ONDANSETRON HCL 4 MG/2ML IJ SOLN
4.0000 mg | Freq: Once | INTRAMUSCULAR | Status: AC
  Administered 2018-08-19: 4 mg via INTRAVENOUS
  Filled 2018-08-19: qty 2

## 2018-08-19 MED ORDER — FUROSEMIDE 10 MG/ML IJ SOLN
INTRAMUSCULAR | Status: AC
  Filled 2018-08-19: qty 4

## 2018-08-19 MED ORDER — ALBUTEROL SULFATE HFA 108 (90 BASE) MCG/ACT IN AERS
1.0000 | INHALATION_SPRAY | RESPIRATORY_TRACT | Status: DC | PRN
  Administered 2018-08-19: 2 via RESPIRATORY_TRACT
  Filled 2018-08-19: qty 6.7

## 2018-08-19 MED ORDER — NITROGLYCERIN IN D5W 200-5 MCG/ML-% IV SOLN
5.0000 ug/min | INTRAVENOUS | Status: DC
  Administered 2018-08-19: 5 ug/min via INTRAVENOUS
  Administered 2018-08-20: 185 ug/min via INTRAVENOUS
  Filled 2018-08-19 (×2): qty 250

## 2018-08-19 MED ORDER — ALBUTEROL (5 MG/ML) CONTINUOUS INHALATION SOLN
10.0000 mg/h | INHALATION_SOLUTION | Freq: Once | RESPIRATORY_TRACT | Status: AC
  Administered 2018-08-20: 10 mg/h via RESPIRATORY_TRACT
  Filled 2018-08-19: qty 20

## 2018-08-19 MED ORDER — MAGNESIUM SULFATE 2 GM/50ML IV SOLN
2.0000 g | Freq: Once | INTRAVENOUS | Status: AC
  Administered 2018-08-19: 2 g via INTRAVENOUS
  Filled 2018-08-19: qty 50

## 2018-08-19 NOTE — Telephone Encounter (Signed)
Dr Dorris Fetch please advise?

## 2018-08-19 NOTE — ED Provider Notes (Signed)
[facility] EMERGENCY DEPARTMENT Provider Note   CSN: [id number] Arrival date & time: 08/19/18  2145    History   Chief Complaint Chief Complaint  Patient presents with  . Shortness of Breath    HPI Holly Baldwin is a 73 y.o. female.     HPI Patient presents with progressive shortness of breath for the past 2 weeks.  States she is had cough without sputum production.  No fever or chills.  Patient states she has chronic upper abdominal pain which is unchanged.  Has been seen by her primary physician and given inhaler which she has been using. Past Medical History:  Diagnosis Date  . Anxiety   . Chronic abdominal pain   . Diabetes mellitus   . GERD (gastroesophageal reflux disease)   . Hiatal hernia   . Hypercholesteremia   . Hypertension     Patient Active Problem List   Diagnosis Date Noted  . Vitamin D deficiency 03/17/2017  . Class 2 severe obesity due to excess calories with serious comorbidity and body mass index (BMI) of 38.0 to 38.9 in adult ([place]) 07/10/2015  . Essential hypertension, benign 01/02/2015  . Mixed hyperlipidemia 01/02/2015  . Unspecified gastritis and gastroduodenitis without mention of hemorrhage 05/06/2012  . Abdominal pain 05/05/2012  . Nausea & vomiting 05/05/2012  . Depression 05/05/2012  . GERD (gastroesophageal reflux disease) 05/05/2012  . Uncontrolled type 2 diabetes mellitus with complication, with long-term current use of insulin ([place]) 05/05/2012  . Primary hypothyroidism 05/05/2012  . Hiatal hernia 05/05/2012  . Malignant hypertension 05/05/2012  . SHOULDER PAIN 03/25/2007  . IMPINGEMENT SYNDROME 03/25/2007  . RUPTURE ROTATOR CUFF 03/25/2007  . HIGH BLOOD PRESSURE 03/25/2007    Past Surgical History:  Procedure Laterality Date  . ABDOMINAL HYSTERECTOMY    . COLONOSCOPY N/A 06/23/2012   Procedure: COLONOSCOPY;  Surgeon: Rogene Houston, MD;  Location: [facility];  Service: Endoscopy;  Laterality: N/A;  100-moved to 1200 Ann to  notify pt  . ESOPHAGOGASTRODUODENOSCOPY N/A 05/06/2012   Procedure: ESOPHAGOGASTRODUODENOSCOPY (EGD);  Surgeon: Rogene Houston, MD;  Location: [facility];  Service: Endoscopy;  Laterality: N/A;  . MASS EXCISION Right 05/26/2012   Procedure: EXCISION NEOPLASM RIGHT THIGH;  Surgeon: Jamesetta So, MD;  Location: [facility] ORS;  Service: General;  Laterality: Right;     OB History   No obstetric history on file.      Home Medications    Prior to Admission medications   Medication Sig Start Date End Date Taking? Authorizing Provider  albuterol (PROVENTIL HFA;VENTOLIN HFA) 108 (90 Base) MCG/ACT inhaler Inhale into the lungs every 6 (six) hours as needed for wheezing or shortness of breath.    [provider]  amLODipine-olmesartan (AZOR) 10-40 MG tablet Take 1 tablet by mouth daily. 05/01/17   [provider]  aspirin EC 81 MG tablet Take 81 mg by mouth at bedtime.    [provider]  Blood Glucose Monitoring Suppl (ACCU-CHEK AVIVA PLUS) w/Device KIT TEST FOUR TIMES DAILY 03/25/17   Cassandria Anger, MD  budesonide-formoterol (SYMBICORT) 160-4.5 MCG/ACT inhaler Inhale 2 puffs into the lungs 2 (two) times daily.    [provider]  capsaicin (ZOSTRIX) 0.025 % cream Apply topically 2 (two) times daily.    [provider]  Cholecalciferol (VITAMIN D3) 125 MCG (5000 UT) CAPS TAKE 1 CAPSULE EVERY DAY 06/07/18   Nida, Marella Chimes, MD  cloNIDine (CATAPRES) 0.2 MG tablet Take 0.2 mg by mouth 2 (two) times  daily.     [provider]  cycloSPORINE (RESTASIS) 0.05 % ophthalmic emulsion Place 1 drop into both eyes daily.    [provider]  EPINEPHrine (EPI-PEN) 0.3 mg/0.3 mL DEVI Inject 0.3 mg into the muscle once.    [provider]  FLUoxetine (PROZAC) 20 MG capsule Take 20 mg by mouth every morning.     [provider]  gabapentin (NEURONTIN) 100 MG capsule TAKE 1 CAPSULE TWICE DAILY 06/07/18   Cassandria Anger, MD   Insulin Aspart FlexPen 100 UNIT/ML SOPN INJECT  10  TO  16 UNITS SUBCUTANEOUSLY THREE TIMES DAILY BEFORE MEALS 06/07/18   Nida, Marella Chimes, MD  Insulin Glargine (LANTUS SOLOSTAR) 100 UNIT/ML Solostar Pen INJECT  50 UNITS SUBCUTANEOUSLY  AT  10PM 06/07/18   Cassandria Anger, MD  levothyroxine (SYNTHROID, LEVOTHROID) 137 MCG tablet Take 1 tablet (137 mcg total) by mouth daily before breakfast. 06/10/18   Cassandria Anger, MD  metFORMIN (GLUCOPHAGE) 500 MG tablet TAKE 1 TABLET TWICE DAILY 06/07/18   Cassandria Anger, MD  simvastatin (ZOCOR) 40 MG tablet TAKE 1 TABLET EVERY DAY 10/05/17   Fayrene Helper, MD  topiramate (TOPAMAX) 100 MG tablet Take 1 tablet by mouth 2 (two) times daily. 04/08/17   [provider]  ziprasidone (GEODON) 80 MG capsule Take 80 mg by mouth at bedtime.      [provider]  zolpidem (AMBIEN) 5 MG tablet Take 5 mg by mouth at bedtime as needed for sleep.    [provider]    Family History Family History  Problem Relation Age of Onset  . Diabetes Mother   . Diabetes Father   . Diabetes Sister   . Diabetes Brother   . Colon cancer Neg Hx   . Colon polyps Neg Hx     Social History Social History   Tobacco Use  . Smoking status: Never Smoker  . Smokeless tobacco: Never Used  Substance Use Topics  . Alcohol use: No  . Drug use: No     Allergies   Bee venom, Ace inhibitors, and Penicillins   Review of Systems Review of Systems  Constitutional: Positive for fatigue. Negative for chills and fever.  Eyes: Negative for visual disturbance.  Respiratory: Positive for cough and shortness of breath.   Cardiovascular: Positive for leg swelling. Negative for chest pain.  Gastrointestinal: Positive for abdominal pain. Negative for diarrhea, nausea and vomiting.  Musculoskeletal: Negative for back pain, myalgias, neck pain and neck stiffness.  Skin: Negative for rash and wound.  Neurological: Negative for dizziness,  weakness, light-headedness, numbness and headaches.  All other systems reviewed and are negative.    Physical Exam Updated Vital Signs BP (!) 242/68   Pulse (!) 38   Temp 98.8 F (37.1 C) (Oral)   Resp (!) 23   Ht _0  (1.651 m)   SpO2 95%   BMI 35.91 kg/m   Physical Exam Vitals signs and nursing note reviewed.  Constitutional:      Appearance: Normal appearance. She is well-developed.  HENT:     Head: Normocephalic and atraumatic.     Nose: Nose normal.     Mouth/Throat:     Mouth: Mucous membranes are moist.  Eyes:     Pupils: Pupils are equal, round, and reactive to light.  Neck:     Musculoskeletal: Normal range of motion and neck supple. No neck rigidity or muscular tenderness.  Cardiovascular:     Rate and Rhythm:  Regular rhythm. Bradycardia present.  Pulmonary:     Effort: Pulmonary effort is normal.     Breath sounds: Wheezing present.     Comments: Mild increased work of breathing with diffuse wheezing. Abdominal:     General: Bowel sounds are normal.     Palpations: Abdomen is soft.     Tenderness: There is abdominal tenderness. There is no guarding or rebound.     Comments: Upper abdominal tenderness to palpation without rebound or guarding.  No obvious pulsatile masses.  Musculoskeletal: Normal range of motion.        General: No tenderness.     Right lower leg: Edema present.     Left lower leg: Edema present.     Comments: 1+ bilateral lower extremity pitting edema.  Distal pulses intact.  Lymphadenopathy:     Cervical: No cervical adenopathy.  Skin:    General: Skin is warm and dry.     Findings: No erythema or rash.  Neurological:     General: No focal deficit present.     Mental Status: She is alert and oriented to person, place, and time.     Comments: Moving all extremities without focal deficit.  Sensation intact.  Psychiatric:        Behavior: Behavior normal.      ED Treatments / Results  Labs (all labs ordered are listed, but only  abnormal results are displayed) Labs Reviewed  CBC WITH DIFFERENTIAL/PLATELET - Abnormal; Notable for the following components:      Result Value   WBC 12.5 (*)    Hemoglobin 10.7 (*)    HCT 34.8 (*)    RDW 15.9 (*)    Neutro Abs 9.7 (*)    Monocytes Absolute 1.1 (*)    All other components within normal limits  SARS CORONAVIRUS 2 (HOSPITAL ORDER, [facility])  BRAIN NATRIURETIC PEPTIDE  COMPREHENSIVE METABOLIC PANEL  TROPONIN I  LIPASE, BLOOD    EKG EKG Interpretation  Date/Time:  Thursday August 19 2018 22:03:44 EDT Ventricular Rate:  99 PR Interval:    QRS Duration: 92 QT Interval:  388 QTC Calculation: 375 R Axis:   72 Text Interpretation:  Sinus rhythm Ventricular bigeminy Borderline repolarization abnormality Borderline ST elevation, anterior leads Baseline wander in lead(s) I III aVR aVL V2 Confirmed by Julianne Rice [phone]) on 08/19/2018 10:13:37 PM   Radiology Dg Chest Port 1 View  Result Date: 08/19/2018 CLINICAL DATA:  Shortness of breath EXAM: PORTABLE CHEST 1 VIEW COMPARISON:  05/03/2017 FINDINGS: Cardiomegaly with vascular congestion. Bilateral interstitial and airspace opacities concerning for edema. Small bilateral pleural effusions and bibasilar atelectasis. IMPRESSION: Cardiomegaly, mild pulmonary edema. Small effusions with bibasilar atelectasis. Electronically Signed   By: Rolm Baptise M.D.   On: 08/19/2018 22:46    Procedures Procedures (including critical care time)  Medications Ordered in ED Medications  hydrALAZINE (APRESOLINE) injection 10 mg (10 mg Intravenous Given 08/19/18 2230)     Initial Impression / Assessment and Plan / ED Course  I have reviewed the triage vital signs and the nursing notes.  Pertinent labs & imaging results that were available during my care of the patient were reviewed by me and considered in my medical decision making (see chart for details).       Patient frequently presents with  significantly elevated blood pressure.  Will give dose of hydralazine.  Likely has combination of bronchospasm/asthma with congestive heart failure.  Will sign out to oncoming emergency physician.  Final Clinical Impressions(s) / ED Diagnoses   Final diagnoses:  SOB (shortness of breath)    ED Discharge Orders    None       Julianne Rice, MD 08/19/18 2252

## 2018-08-19 NOTE — ED Provider Notes (Addendum)
Care assumed from Dr. Lita Mains.  Patient with progressive shortness of breath for the past 2 weeks.  Cough with white sputum production.  No fever.  EKG shows bigeminy without acute ST changes.  Patient placed on BiPAP for increased work of breathing.  Chest x-ray shows edema patient is hypertensive.  She started on IV nitroglycerin and Lasix.  She is given Solu-Medrol and bronchodilators.  Coronavirus testing is negative.  Suspect hypertensive emergency causing pulmonary edema with likely COPD exacerbation as well.  Continue BiPAP, IV nitroglycerin, Lasix Patient also given hydralazine for blood pressure control.  Patient tolerating BiPAP well.  Admission discussed with Dr. Maudie Mercury.  Holly Baldwin was evaluated in Emergency Department on 08/20/2018 for the symptoms described in the history of present illness. She was evaluated in the context of the global COVID-19 pandemic, which necessitated consideration that the patient might be at risk for infection with the SARS-CoV-2 virus that causes COVID-19. Institutional protocols and algorithms that pertain to the evaluation of patients at risk for COVID-19 are in a state of rapid change based on information released by regulatory bodies including the CDC and federal and state organizations. These policies and algorithms were followed during the patient's care in the ED.   CRITICAL CARE Performed by: Ezequiel Essex Total critical care time: 45 minutes Critical care time was exclusive of separately billable procedures and treating other patients. Critical care was necessary to treat or prevent imminent or life-threatening deterioration. Critical care was time spent personally by me on the following activities: development of treatment plan with patient and/or surrogate as well as nursing, discussions with consultants, evaluation of patient's response to treatment, examination of patient, obtaining history from patient or surrogate, ordering and  performing treatments and interventions, ordering and review of laboratory studies, ordering and review of radiographic studies, pulse oximetry and re-evaluation of patient's condition.    Ezequiel Essex, MD 08/20/18 [id number]    Ezequiel Essex, MD 08/20/18 [phone]

## 2018-08-19 NOTE — Telephone Encounter (Signed)
Patient said her sugars have went up. Here are her readings for the last 9 days   Wednesday 200 Thursday 75 Friday 130 Saturday 189 Sunday 200 Monday 131 Tuesday 189 Wednesday 89 Thursday 200  She wants to know does she need to increase her insulin ?

## 2018-08-19 NOTE — ED Triage Notes (Signed)
Pt states she has been short of breath x 1 week. Was seen by her PCP and given an inhaler to use but states it's not helping her.

## 2018-08-19 NOTE — Telephone Encounter (Signed)
She can increase her lantus to 50 units nightlt.

## 2018-08-19 NOTE — ED Notes (Signed)
X ray just left room

## 2018-08-20 ENCOUNTER — Inpatient Hospital Stay (HOSPITAL_COMMUNITY): Payer: Medicare HMO

## 2018-08-20 DIAGNOSIS — J9601 Acute respiratory failure with hypoxia: Secondary | ICD-10-CM

## 2018-08-20 DIAGNOSIS — N183 Chronic kidney disease, stage 3 unspecified: Secondary | ICD-10-CM

## 2018-08-20 DIAGNOSIS — E1122 Type 2 diabetes mellitus with diabetic chronic kidney disease: Secondary | ICD-10-CM | POA: Diagnosis present

## 2018-08-20 DIAGNOSIS — Y929 Unspecified place or not applicable: Secondary | ICD-10-CM | POA: Diagnosis not present

## 2018-08-20 DIAGNOSIS — R945 Abnormal results of liver function studies: Secondary | ICD-10-CM | POA: Diagnosis not present

## 2018-08-20 DIAGNOSIS — E039 Hypothyroidism, unspecified: Secondary | ICD-10-CM | POA: Diagnosis present

## 2018-08-20 DIAGNOSIS — K219 Gastro-esophageal reflux disease without esophagitis: Secondary | ICD-10-CM | POA: Diagnosis present

## 2018-08-20 DIAGNOSIS — D509 Iron deficiency anemia, unspecified: Secondary | ICD-10-CM | POA: Diagnosis present

## 2018-08-20 DIAGNOSIS — I161 Hypertensive emergency: Secondary | ICD-10-CM | POA: Diagnosis present

## 2018-08-20 DIAGNOSIS — I509 Heart failure, unspecified: Secondary | ICD-10-CM

## 2018-08-20 DIAGNOSIS — N179 Acute kidney failure, unspecified: Secondary | ICD-10-CM | POA: Diagnosis present

## 2018-08-20 DIAGNOSIS — Z794 Long term (current) use of insulin: Secondary | ICD-10-CM | POA: Diagnosis not present

## 2018-08-20 DIAGNOSIS — E1165 Type 2 diabetes mellitus with hyperglycemia: Secondary | ICD-10-CM

## 2018-08-20 DIAGNOSIS — D631 Anemia in chronic kidney disease: Secondary | ICD-10-CM | POA: Diagnosis present

## 2018-08-20 DIAGNOSIS — E785 Hyperlipidemia, unspecified: Secondary | ICD-10-CM | POA: Diagnosis present

## 2018-08-20 DIAGNOSIS — Z90710 Acquired absence of both cervix and uterus: Secondary | ICD-10-CM | POA: Diagnosis not present

## 2018-08-20 DIAGNOSIS — F419 Anxiety disorder, unspecified: Secondary | ICD-10-CM | POA: Diagnosis present

## 2018-08-20 DIAGNOSIS — I10 Essential (primary) hypertension: Secondary | ICD-10-CM | POA: Diagnosis not present

## 2018-08-20 DIAGNOSIS — E7800 Pure hypercholesterolemia, unspecified: Secondary | ICD-10-CM | POA: Diagnosis present

## 2018-08-20 DIAGNOSIS — Z91030 Bee allergy status: Secondary | ICD-10-CM | POA: Diagnosis not present

## 2018-08-20 DIAGNOSIS — E876 Hypokalemia: Secondary | ICD-10-CM | POA: Diagnosis present

## 2018-08-20 DIAGNOSIS — F329 Major depressive disorder, single episode, unspecified: Secondary | ICD-10-CM | POA: Diagnosis present

## 2018-08-20 DIAGNOSIS — I130 Hypertensive heart and chronic kidney disease with heart failure and stage 1 through stage 4 chronic kidney disease, or unspecified chronic kidney disease: Secondary | ICD-10-CM | POA: Diagnosis present

## 2018-08-20 DIAGNOSIS — Z888 Allergy status to other drugs, medicaments and biological substances status: Secondary | ICD-10-CM | POA: Diagnosis not present

## 2018-08-20 DIAGNOSIS — Z20828 Contact with and (suspected) exposure to other viral communicable diseases: Secondary | ICD-10-CM | POA: Diagnosis present

## 2018-08-20 DIAGNOSIS — I5031 Acute diastolic (congestive) heart failure: Secondary | ICD-10-CM | POA: Diagnosis not present

## 2018-08-20 DIAGNOSIS — Z880 Allergy status to penicillin: Secondary | ICD-10-CM | POA: Diagnosis not present

## 2018-08-20 DIAGNOSIS — E1140 Type 2 diabetes mellitus with diabetic neuropathy, unspecified: Secondary | ICD-10-CM | POA: Diagnosis present

## 2018-08-20 DIAGNOSIS — I5033 Acute on chronic diastolic (congestive) heart failure: Secondary | ICD-10-CM | POA: Diagnosis present

## 2018-08-20 DIAGNOSIS — E118 Type 2 diabetes mellitus with unspecified complications: Secondary | ICD-10-CM

## 2018-08-20 DIAGNOSIS — D649 Anemia, unspecified: Secondary | ICD-10-CM | POA: Diagnosis present

## 2018-08-20 DIAGNOSIS — Z6837 Body mass index (BMI) 37.0-37.9, adult: Secondary | ICD-10-CM | POA: Diagnosis not present

## 2018-08-20 LAB — GLUCOSE, CAPILLARY
Glucose-Capillary: 305 mg/dL — ABNORMAL HIGH (ref 70–99)
Glucose-Capillary: 376 mg/dL — ABNORMAL HIGH (ref 70–99)
Glucose-Capillary: 398 mg/dL — ABNORMAL HIGH (ref 70–99)
Glucose-Capillary: 418 mg/dL — ABNORMAL HIGH (ref 70–99)
Glucose-Capillary: 269 mg/dL — ABNORMAL HIGH (ref 70–99)

## 2018-08-20 LAB — ECHOCARDIOGRAM COMPLETE
Height: 64 in
Weight: 3675.51 oz

## 2018-08-20 LAB — COMPREHENSIVE METABOLIC PANEL
ALT: 47 U/L — ABNORMAL HIGH (ref 0–44)
AST: 26 U/L (ref 15–41)
Albumin: 2.7 g/dL — ABNORMAL LOW (ref 3.5–5.0)
Alkaline Phosphatase: 134 U/L — ABNORMAL HIGH (ref 38–126)
Anion gap: 12 (ref 5–15)
BUN: 29 mg/dL — ABNORMAL HIGH (ref 8–23)
CO2: 24 mmol/L (ref 22–32)
Calcium: 8.3 mg/dL — ABNORMAL LOW (ref 8.9–10.3)
Chloride: 103 mmol/L (ref 98–111)
Creatinine, Ser: 2.36 mg/dL — ABNORMAL HIGH (ref 0.44–1.00)
GFR calc Af Amer: 23 mL/min — ABNORMAL LOW (ref >60)
GFR calc non Af Amer: 20 mL/min — ABNORMAL LOW (ref >60)
Glucose, Bld: 365 mg/dL — ABNORMAL HIGH (ref 70–99)
Potassium: 3.5 mmol/L (ref 3.5–5.1)
Sodium: 139 mmol/L (ref 135–145)
Total Bilirubin: 0.5 mg/dL (ref 0.3–1.2)
Total Protein: 5.4 g/dL — ABNORMAL LOW (ref 6.5–8.1)

## 2018-08-20 LAB — TROPONIN I
Troponin I: 0.06 ng/mL (ref <0.03)
Troponin I: 0.05 ng/mL (ref <0.03)
Troponin I: 0.04 ng/mL (ref <0.03)

## 2018-08-20 LAB — CBC
HCT: 30.6 % — ABNORMAL LOW (ref 36.0–46.0)
Hemoglobin: 9.2 g/dL — ABNORMAL LOW (ref 12.0–15.0)
MCH: 26.4 pg (ref 26.0–34.0)
MCHC: 30.1 g/dL (ref 30.0–36.0)
MCV: 87.7 fL (ref 80.0–100.0)
Platelets: 239 10*3/uL (ref 150–400)
RBC: 3.49 MIL/uL — ABNORMAL LOW (ref 3.87–5.11)
RDW: 15.8 % — ABNORMAL HIGH (ref 11.5–15.5)
WBC: 9.6 10*3/uL (ref 4.0–10.5)
nRBC: 0 % (ref 0.0–0.2)

## 2018-08-20 LAB — IRON AND TIBC
Iron: 17 ug/dL — ABNORMAL LOW (ref 28–170)
Saturation Ratios: 7 % — ABNORMAL LOW (ref 10.4–31.8)
TIBC: 241 ug/dL — ABNORMAL LOW (ref 250–450)
UIBC: 224 ug/dL

## 2018-08-20 LAB — MRSA PCR SCREENING: MRSA by PCR: NEGATIVE

## 2018-08-20 LAB — TSH: TSH: 2.209 u[IU]/mL (ref 0.350–4.500)

## 2018-08-20 LAB — FERRITIN: Ferritin: 23 ng/mL (ref 11–307)

## 2018-08-20 MED ORDER — ENOXAPARIN SODIUM 30 MG/0.3ML ~~LOC~~ SOLN
30.0000 mg | SUBCUTANEOUS | Status: DC
  Administered 2018-08-20 – 2018-08-24 (×6): 30 mg via SUBCUTANEOUS
  Filled 2018-08-20 (×6): qty 0.3

## 2018-08-20 MED ORDER — SIMVASTATIN 20 MG PO TABS
40.0000 mg | ORAL_TABLET | Freq: Every day | ORAL | Status: DC
  Administered 2018-08-20 – 2018-08-21 (×2): 40 mg via ORAL
  Filled 2018-08-20 (×2): qty 2

## 2018-08-20 MED ORDER — FUROSEMIDE 10 MG/ML IJ SOLN
20.0000 mg | Freq: Every day | INTRAMUSCULAR | Status: DC
  Administered 2018-08-20 – 2018-08-21 (×2): 20 mg via INTRAVENOUS
  Filled 2018-08-20 (×2): qty 2

## 2018-08-20 MED ORDER — HYDRALAZINE HCL 20 MG/ML IJ SOLN
10.0000 mg | Freq: Once | INTRAMUSCULAR | Status: AC
  Administered 2018-08-20: 10 mg via INTRAVENOUS
  Filled 2018-08-20: qty 1

## 2018-08-20 MED ORDER — ZIPRASIDONE HCL 40 MG PO CAPS
80.0000 mg | ORAL_CAPSULE | Freq: Every day | ORAL | Status: DC
  Administered 2018-08-20 – 2018-08-24 (×5): 80 mg via ORAL
  Filled 2018-08-20 (×2): qty 1
  Filled 2018-08-20 (×3): qty 2
  Filled 2018-08-20: qty 1
  Filled 2018-08-20 (×2): qty 2
  Filled 2018-08-20 (×2): qty 1

## 2018-08-20 MED ORDER — HYDRALAZINE HCL 25 MG PO TABS
50.0000 mg | ORAL_TABLET | Freq: Three times a day (TID) | ORAL | Status: DC
  Administered 2018-08-20 (×2): 50 mg via ORAL
  Filled 2018-08-20 (×2): qty 2

## 2018-08-20 MED ORDER — FLUOXETINE HCL 20 MG PO CAPS
20.0000 mg | ORAL_CAPSULE | Freq: Every day | ORAL | Status: DC
  Administered 2018-08-20 – 2018-08-25 (×6): 20 mg via ORAL
  Filled 2018-08-20 (×6): qty 1

## 2018-08-20 MED ORDER — AMLODIPINE BESYLATE 5 MG PO TABS
2.5000 mg | ORAL_TABLET | Freq: Every day | ORAL | Status: DC
  Administered 2018-08-20 – 2018-08-21 (×2): 2.5 mg via ORAL
  Filled 2018-08-20 (×2): qty 1

## 2018-08-20 MED ORDER — CLONIDINE HCL 0.2 MG PO TABS: 0.2000 mg | ORAL_TABLET | Freq: Two times a day (BID) | ORAL | Status: DC

## 2018-08-20 MED ORDER — SPIRONOLACTONE 25 MG PO TABS
25.0000 mg | ORAL_TABLET | Freq: Every day | ORAL | Status: DC
  Administered 2018-08-20 – 2018-08-23 (×4): 25 mg via ORAL
  Filled 2018-08-20 (×4): qty 1

## 2018-08-20 MED ORDER — GABAPENTIN 100 MG PO CAPS
100.0000 mg | ORAL_CAPSULE | Freq: Two times a day (BID) | ORAL | Status: DC
  Administered 2018-08-20 – 2018-08-25 (×11): 100 mg via ORAL
  Filled 2018-08-20 (×11): qty 1

## 2018-08-20 MED ORDER — INSULIN ASPART 100 UNIT/ML ~~LOC~~ SOLN
0.0000 [IU] | Freq: Three times a day (TID) | SUBCUTANEOUS | Status: DC
  Administered 2018-08-20: 9 [IU] via SUBCUTANEOUS
  Administered 2018-08-20: 7 [IU] via SUBCUTANEOUS

## 2018-08-20 MED ORDER — LEVOTHYROXINE SODIUM 137 MCG PO TABS
137.0000 ug | ORAL_TABLET | Freq: Every day | ORAL | Status: DC
  Administered 2018-08-20 – 2018-08-24 (×4): 137 ug via ORAL
  Filled 2018-08-20 (×4): qty 1

## 2018-08-20 MED ORDER — INSULIN ASPART 100 UNIT/ML ~~LOC~~ SOLN
0.0000 [IU] | Freq: Every day | SUBCUTANEOUS | Status: DC
  Administered 2018-08-20: 3 [IU] via SUBCUTANEOUS
  Administered 2018-08-21: 4 [IU] via SUBCUTANEOUS

## 2018-08-20 MED ORDER — HYDRALAZINE HCL 20 MG/ML IJ SOLN
10.0000 mg | Freq: Four times a day (QID) | INTRAMUSCULAR | Status: DC | PRN
  Administered 2018-08-20 – 2018-08-23 (×8): 10 mg via INTRAVENOUS
  Filled 2018-08-20 (×8): qty 1

## 2018-08-20 MED ORDER — CLONIDINE HCL 0.2 MG PO TABS
0.2000 mg | ORAL_TABLET | Freq: Once | ORAL | Status: AC
  Administered 2018-08-21: 0.2 mg via ORAL
  Filled 2018-08-20: qty 1

## 2018-08-20 MED ORDER — TOPIRAMATE 100 MG PO TABS
100.0000 mg | ORAL_TABLET | Freq: Two times a day (BID) | ORAL | Status: DC
  Administered 2018-08-20 – 2018-08-25 (×11): 100 mg via ORAL
  Filled 2018-08-20 (×12): qty 1

## 2018-08-20 MED ORDER — TRAMADOL HCL 50 MG PO TABS
50.0000 mg | ORAL_TABLET | Freq: Four times a day (QID) | ORAL | Status: AC | PRN
  Administered 2018-08-20 (×2): 50 mg via ORAL
  Filled 2018-08-20 (×2): qty 1

## 2018-08-20 MED ORDER — TRAZODONE HCL 50 MG PO TABS
50.0000 mg | ORAL_TABLET | Freq: Once | ORAL | Status: AC
  Administered 2018-08-20: 50 mg via ORAL
  Filled 2018-08-20: qty 1

## 2018-08-20 MED ORDER — ALBUTEROL SULFATE (2.5 MG/3ML) 0.083% IN NEBU: 2.5000 mg | INHALATION_SOLUTION | Freq: Four times a day (QID) | RESPIRATORY_TRACT | Status: DC | PRN

## 2018-08-20 MED ORDER — INSULIN GLARGINE 100 UNIT/ML ~~LOC~~ SOLN
30.0000 [IU] | Freq: Every day | SUBCUTANEOUS | Status: DC
  Administered 2018-08-20 – 2018-08-21 (×2): 30 [IU] via SUBCUTANEOUS
  Filled 2018-08-20 (×3): qty 0.3

## 2018-08-20 MED ORDER — MOMETASONE FURO-FORMOTEROL FUM 200-5 MCG/ACT IN AERO
2.0000 | INHALATION_SPRAY | Freq: Two times a day (BID) | RESPIRATORY_TRACT | Status: DC
  Administered 2018-08-20 – 2018-08-25 (×9): 2 via RESPIRATORY_TRACT
  Filled 2018-08-20 (×2): qty 8.8

## 2018-08-20 MED ORDER — INSULIN ASPART 100 UNIT/ML ~~LOC~~ SOLN: 0.0000 [IU] | Freq: Every day | SUBCUTANEOUS | Status: DC

## 2018-08-20 MED ORDER — INSULIN ASPART 100 UNIT/ML ~~LOC~~ SOLN
0.0000 [IU] | Freq: Three times a day (TID) | SUBCUTANEOUS | Status: DC
  Administered 2018-08-20: 20 [IU] via SUBCUTANEOUS
  Administered 2018-08-21 (×2): 11 [IU] via SUBCUTANEOUS
  Administered 2018-08-21: 5 [IU] via SUBCUTANEOUS
  Administered 2018-08-22: 11 [IU] via SUBCUTANEOUS
  Administered 2018-08-22: 3 [IU] via SUBCUTANEOUS

## 2018-08-20 MED ORDER — HYDRALAZINE HCL 25 MG PO TABS
100.0000 mg | ORAL_TABLET | Freq: Three times a day (TID) | ORAL | Status: DC
  Administered 2018-08-20 – 2018-08-25 (×15): 100 mg via ORAL
  Filled 2018-08-20 (×15): qty 4

## 2018-08-20 MED ORDER — POTASSIUM CHLORIDE CRYS ER 10 MEQ PO TBCR
10.0000 meq | EXTENDED_RELEASE_TABLET | Freq: Once | ORAL | Status: AC
  Administered 2018-08-20: 10 meq via ORAL
  Filled 2018-08-20: qty 1

## 2018-08-20 MED ORDER — ASPIRIN EC 81 MG PO TBEC
81.0000 mg | DELAYED_RELEASE_TABLET | Freq: Every day | ORAL | Status: DC
  Administered 2018-08-20 – 2018-08-24 (×5): 81 mg via ORAL
  Filled 2018-08-20 (×5): qty 1

## 2018-08-20 NOTE — Care Management Important Message (Signed)
Important Message  Patient Details  Name: Holly Baldwin MRN: [id number] Date of Birth: 1945-11-09   Medicare Important Message Given:  Yes     Tommy Medal 08/20/2018, 3:03 PM

## 2018-08-20 NOTE — H&P (Addendum)
TRH H&P    Patient Demographics:    Holly Baldwin, is a 73 y.o. female  MRN: [id number]  DOB - [birth year]  Admit Date - 08/19/2018  Referring MD/NP/PA: Ezequiel Essex  Outpatient Primary MD for the patient is Sharilyn Sites, MD  Patient coming from: home  Chief complaint- dyspnea   HPI:    Holly Baldwin  is a 73 y.o. female, w hypertension, hyperlipidemia, Dm2, Gerd, Anxiety, apparently c/o dyspnea x1 week. Slight dry cough.  May have slight lower ext edema. Pt doesn't think that she gained weight.  Pt denies fever, chills, cp, palp, n/v, diarrhea, brbpr.  Pt notes has had some RUQ discomfort recently.   Pt notes that she has been sick when taking cardizem CD 325m po qday.  While reviewing her medications bottles, she told me that she is also taking the cardizem CD 2430mpo qday as well for a total of 60017mf cardizem total per day.   Pt seen by PCP recently ,, this past Monday and was given a proventil inhaler without improvement in her dyspnea.    In ED, T 98.8 P 61 (31 not correct), R 27  Bp 255/72  Pox 92% On bipap  Wbc 12.5, Hgb 10.7, Plt 276 Bun 26, Creatinine 2.09   Na 141, K 3.2 Calcium 8.5 Glucose 241  Trop 0.03 EKG  Covid -19 negative  CXR  IMPRESSION: Cardiomegaly, mild pulmonary edema.  Pt treated with solumedrol 125m28m x1, mag sulfate 2gm iv x1, hydralazine 10mg19mx1, nitro GTT.   Pt will be admitted for dyspnea secondary to hypertensive emergency causing CHF. Possibly mild concomittant Copd exacerbation as well.     Review of systems:    In addition to the HPI above,  No Fever-chills, No Headache, No changes with Vision or hearing, No problems swallowing food or Liquids, No Chest pain, Cough or Shortness of Breath, No Abdominal pain, No Nausea or Vomiting, bowel movements are regular, No Blood in stool or Urine, No dysuria, No new skin rashes or bruises, No new joints  pains-aches,  No new weakness, tingling, numbness in any extremity, No recent weight gain or loss, No polyuria, polydypsia or polyphagia, No significant Mental Stressors.  All other systems reviewed and are negative.    Past History of the following :    Past Medical History:  Diagnosis Date  . Anxiety   . Chronic abdominal pain   . Diabetes mellitus   . GERD (gastroesophageal reflux disease)   . Hiatal hernia   . Hypercholesteremia   . Hypertension       Past Surgical History:  Procedure Laterality Date  . ABDOMINAL HYSTERECTOMY    . COLONOSCOPY N/A 06/23/2012   Procedure: COLONOSCOPY;  Surgeon: NajeeRogene Houston  [facility];  Service: Endoscopy;  Laterality: N/A;  100-moved to 1200 Ann to notify pt  . ESOPHAGOGASTRODUODENOSCOPY N/A 05/06/2012   Procedure: ESOPHAGOGASTRODUODENOSCOPY (EGD);  Surgeon: NajeeRogene Houston  Location: [facility];  Service: Endoscopy;  Laterality: N/A;  . MASS EXCISION  Right 05/26/2012   Procedure: EXCISION NEOPLASM RIGHT THIGH;  Surgeon: Jamesetta So, MD;  Location: [facility] ORS;  Service: General;  Laterality: Right;      Social History:      Social History   Tobacco Use  . Smoking status: Never Smoker  . Smokeless tobacco: Never Used  Substance Use Topics  . Alcohol use: No       Family History :     Family History  Problem Relation Age of Onset  . Diabetes Mother   . Diabetes Father   . Diabetes Sister   . Diabetes Brother   . Colon cancer Neg Hx   . Colon polyps Neg Hx        Home Medications:   Prior to Admission medications   Medication Sig Start Date End Date Taking? Authorizing Provider  albuterol (PROVENTIL HFA;VENTOLIN HFA) 108 (90 Base) MCG/ACT inhaler Inhale into the lungs every 6 (six) hours as needed for wheezing or shortness of breath.    [provider]  amLODipine-olmesartan (AZOR) 10-40 MG tablet Take 1 tablet by mouth daily. 05/01/17   [provider]  aspirin EC 81 MG  tablet Take 81 mg by mouth at bedtime.    [provider]  Blood Glucose Monitoring Suppl (ACCU-CHEK AVIVA PLUS) w/Device KIT TEST FOUR TIMES DAILY 03/25/17   Cassandria Anger, MD  budesonide-formoterol (SYMBICORT) 160-4.5 MCG/ACT inhaler Inhale 2 puffs into the lungs 2 (two) times daily.    [provider]  capsaicin (ZOSTRIX) 0.025 % cream Apply topically 2 (two) times daily.    [provider]  Cholecalciferol (VITAMIN D3) 125 MCG (5000 UT) CAPS TAKE 1 CAPSULE EVERY DAY 06/07/18   Nida, Marella Chimes, MD  cloNIDine (CATAPRES) 0.2 MG tablet Take 0.2 mg by mouth 2 (two) times daily.     [provider]  cycloSPORINE (RESTASIS) 0.05 % ophthalmic emulsion Place 1 drop into both eyes daily.    [provider]  EPINEPHrine (EPI-PEN) 0.3 mg/0.3 mL DEVI Inject 0.3 mg into the muscle once.    [provider]  FLUoxetine (PROZAC) 20 MG capsule Take 20 mg by mouth every morning.     [provider]  gabapentin (NEURONTIN) 100 MG capsule TAKE 1 CAPSULE TWICE DAILY 06/07/18   Cassandria Anger, MD  Insulin Aspart FlexPen 100 UNIT/ML SOPN INJECT  10  TO  16 UNITS SUBCUTANEOUSLY THREE TIMES DAILY BEFORE MEALS 06/07/18   Nida, Marella Chimes, MD  Insulin Glargine (LANTUS SOLOSTAR) 100 UNIT/ML Solostar Pen INJECT  50 UNITS SUBCUTANEOUSLY  AT  10PM 06/07/18   Cassandria Anger, MD  levothyroxine (SYNTHROID, LEVOTHROID) 137 MCG tablet Take 1 tablet (137 mcg total) by mouth daily before breakfast. 06/10/18   Cassandria Anger, MD  metFORMIN (GLUCOPHAGE) 500 MG tablet TAKE 1 TABLET TWICE DAILY 06/07/18   Cassandria Anger, MD  simvastatin (ZOCOR) 40 MG tablet TAKE 1 TABLET EVERY DAY 10/05/17   Fayrene Helper, MD  topiramate (TOPAMAX) 100 MG tablet Take 1 tablet by mouth 2 (two) times daily. 04/08/17   [provider]  ziprasidone (GEODON) 80 MG capsule Take 80 mg by mouth at bedtime.      [provider]  zolpidem (AMBIEN) 5  MG tablet Take 5 mg by mouth at bedtime as needed for sleep.    [provider]     Allergies:     Allergies  Allergen Reactions  . Bee Venom Anaphylaxis  . Ace Inhibitors   .  Penicillins Hives    .Has patient had a PCN reaction causing immediate rash, facial/tongue/throat swelling, SOB or lightheadedness with hypotension: Yes Has patient had a PCN reaction causing severe rash involving mucus membranes or skin necrosis: No Has patient had a PCN reaction that required hospitalization: Yes Has patient had a PCN reaction occurring within the last 10 years: No If all of the above answers are "NO", then may proceed with Cephalosporin use.      Physical Exam:   Vitals  Blood pressure (!) 193/73, pulse 67, temperature 98.8 F (37.1 C), temperature source Oral, resp. rate (!) 25, height '5\' 5"'  (1.651 m), SpO2 98 %.  1.  General: axoxo3  2. Psychiatric: euthymic  3. Neurologic: cn2-12 intact, reflexes 2+ symmetric, diffuse with no clonus, motor 5/5 in all 4 ext  4. HEENMT:  Anicteric, pupils 1.76m symmetric, direct, consensual, near intact Mmm Neck: slight jvd  5. Respiratory : Slight decrease in bs at bilateral base, slight crackles bilateral base, no wheezing  6. Cardiovascular : rrr s1, s2, no m/g/r  7. Gastrointestinal:  Abd: soft, nt, nd, +bs  8. Skin:  Ext: no c/c,  Trace edema. ,  No rash  9.Musculoskeletal:  Good ROM  No adenopathy    Data Review:    CBC Recent Labs  Lab 08/19/18 2212  WBC 12.5*  HGB 10.7*  HCT 34.8*  PLT 276  MCV 86.1  MCH 26.5  MCHC 30.7  RDW 15.9*  LYMPHSABS 1.5  MONOABS 1.1*  EOSABS 0.2  BASOSABS 0.1   ------------------------------------------------------------------------------------------------------------------  Results for orders placed or performed during the hospital encounter of 08/19/18 (from the past 48 hour(s))  SARS Coronavirus 2 (CEPHEID- Performed in [facility]), Hosp Order      Status: None   Collection Time: 08/19/18 10:00 PM   Specimen: Nasopharyngeal Swab  Result Value Ref Range   SARS Coronavirus 2 NEGATIVE NEGATIVE    Comment: (NOTE) If result is NEGATIVE SARS-CoV-2 target nucleic acids are NOT DETECTED. The SARS-CoV-2 RNA is generally detectable in upper and lower  respiratory specimens during the acute phase of infection. The lowest  concentration of SARS-CoV-2 viral copies this assay can detect is 250  copies / mL. A negative result does not preclude SARS-CoV-2 infection  and should not be used as the sole basis for treatment or other  patient management decisions.  A negative result may occur with  improper specimen collection / handling, submission of specimen other  than nasopharyngeal swab, presence of viral mutation(s) within the  areas targeted by this assay, and inadequate number of viral copies  (<250 copies / mL). A negative result must be combined with clinical  observations, patient history, and epidemiological information. If result is POSITIVE SARS-CoV-2 target nucleic acids are DETECTED. The SARS-CoV-2 RNA is generally detectable in upper and lower  respiratory specimens dur ing the acute phase of infection.  Positive  results are indicative of active infection with SARS-CoV-2.  Clinical  correlation with patient history and other diagnostic information is  necessary to determine patient infection status.  Positive results do  not rule out bacterial infection or co-infection with other viruses. If result is PRESUMPTIVE POSTIVE SARS-CoV-2 nucleic acids MAY BE PRESENT.   A presumptive positive result was obtained on the submitted specimen  and confirmed on repeat testing.  While 2019 novel coronavirus  (SARS-CoV-2) nucleic acids may be present in the submitted sample  additional confirmatory testing may be necessary for epidemiological  and / or clinical  management purposes  to differentiate between  SARS-CoV-2 and other Sarbecovirus  currently known to infect humans.  If clinically indicated additional testing with an alternate test  methodology [phone]) is advised. The SARS-CoV-2 RNA is generally  detectable in upper and lower respiratory sp ecimens during the acute  phase of infection. The expected result is Negative. Fact Sheet for Patients:  StrictlyIdeas.no Fact Sheet for Healthcare Providers: BankingDealers.co.za This test is not yet approved or cleared by the [place] FDA and has been authorized for detection and/or diagnosis of SARS-CoV-2 by FDA under an Emergency Use Authorization (EUA).  This EUA will remain in effect (meaning this test can be used) for the duration of the COVID-19 declaration under Section 564(b)(1) of the Act, 21 U.S.C. section 360bbb-3(b)(1), unless the authorization is terminated or revoked sooner. Performed at [facility], [address] [place], [place] [address]   CBC with Differential/Platelet     Status: Abnormal   Collection Time: 08/19/18 10:12 PM  Result Value Ref Range   WBC 12.5 (H) 4.0 - 10.5 K/uL   RBC 4.04 3.87 - 5.11 MIL/uL   Hemoglobin 10.7 (L) 12.0 - 15.0 g/dL   HCT 34.8 (L) 36.0 - 46.0 %   MCV 86.1 80.0 - 100.0 fL   MCH 26.5 26.0 - 34.0 pg   MCHC 30.7 30.0 - 36.0 g/dL   RDW 15.9 (H) 11.5 - 15.5 %   Platelets 276 150 - 400 K/uL   nRBC 0.0 0.0 - 0.2 %   Neutrophils Relative % 77 %   Neutro Abs 9.7 (H) 1.7 - 7.7 K/uL   Lymphocytes Relative 12 %   Lymphs Abs 1.5 0.7 - 4.0 K/uL   Monocytes Relative 8 %   Monocytes Absolute 1.1 (H) 0.1 - 1.0 K/uL   Eosinophils Relative 2 %   Eosinophils Absolute 0.2 0.0 - 0.5 K/uL   Basophils Relative 1 %   Basophils Absolute 0.1 0.0 - 0.1 K/uL   Immature Granulocytes 0 %   Abs Immature Granulocytes 0.05 0.00 - 0.07 K/uL    Comment: Performed at [facility], [address] [place], [place] [address]  Brain natriuretic peptide     Status: Abnormal   Collection Time:  08/19/18 10:12 PM  Result Value Ref Range   B Natriuretic Peptide 416.0 (H) 0.0 - 100.0 pg/mL    Comment: Performed at [facility], [address], [place], [place] [address]  Comprehensive metabolic panel     Status: Abnormal   Collection Time: 08/19/18 10:12 PM  Result Value Ref Range   Sodium 141 135 - 145 mmol/L   Potassium 3.2 (L) 3.5 - 5.1 mmol/L   Chloride 104 98 - 111 mmol/L   CO2 24 22 - 32 mmol/L   Glucose, Bld 241 (H) 70 - 99 mg/dL   BUN 26 (H) 8 - 23 mg/dL   Creatinine, Ser 2.09 (H) 0.44 - 1.00 mg/dL   Calcium 8.5 (L) 8.9 - 10.3 mg/dL   Total Protein 6.4 (L) 6.5 - 8.1 g/dL   Albumin 3.1 (L) 3.5 - 5.0 g/dL   AST 38 15 - 41 U/L   ALT 59 (H) 0 - 44 U/L   Alkaline Phosphatase 155 (H) 38 - 126 U/L   Total Bilirubin 0.2 (L) 0.3 - 1.2 mg/dL   GFR calc non Af Amer 23 (L) >60 mL/min   GFR calc Af Amer 26 (L) >60 mL/min   Anion gap 13 5 - 15    Comment: Performed at [facility], [address] [place], [place] [address]  Troponin I - ONCE - STAT     Status: Abnormal   Collection Time: 08/19/18 10:12 PM  Result Value Ref Range   Troponin I 0.03 (HH) <0.03 ng/mL    Comment: CRITICAL RESULT CALLED TO, READ BACK BY AND VERIFIED WITH: NICHOLS,K. AT 2319 ON 08/19/2018 BY EVA Performed at [facility], [address], [place], [place] [address]   Lipase, blood     Status: None   Collection Time: 08/19/18 10:12 PM  Result Value Ref Range   Lipase 24 11 - 51 U/L    Comment: Performed at [facility], [address], [place], [place] [address]  Blood gas, arterial (at [facility])     Status: Abnormal   Collection Time: 08/19/18 11:28 PM  Result Value Ref Range   FIO2 32.00    pH, Arterial 7.353 7.350 - 7.450   pCO2 arterial 50.4 (H) 32.0 - 48.0 mmHg   pO2, Arterial 67.5 (L) 83.0 - 108.0 mmHg   Bicarbonate 25.8 20.0 - 28.0 mmol/L   Acid-Base Excess 2.3 (H) 0.0 - 2.0 mmol/L   O2 Saturation 92.0 %   Patient temperature 37.0    Allens test (pass/fail) PASS PASS    Comment: Performed  at [facility], [address] [place], [place] [address]    Chemistries  Recent Labs  Lab 08/19/18 2212  NA 141  K 3.2*  CL 104  CO2 24  GLUCOSE 241*  BUN 26*  CREATININE 2.09*  CALCIUM 8.5*  AST 38  ALT 59*  ALKPHOS 155*  BILITOT 0.2*   ------------------------------------------------------------------------------------------------------------------  ------------------------------------------------------------------------------------------------------------------ GFR: CrCl cannot be calculated (Unknown ideal weight.). Liver Function Tests: Recent Labs  Lab 08/19/18 2212  AST 38  ALT 59*  ALKPHOS 155*  BILITOT 0.2*  PROT 6.4*  ALBUMIN 3.1*   Recent Labs  Lab 08/19/18 2212  LIPASE 24   No results for input(s): AMMONIA in the last 168 hours. Coagulation Profile: No results for input(s): INR, PROTIME in the last 168 hours. Cardiac Enzymes: Recent Labs  Lab 08/19/18 2212  TROPONINI 0.03*   BNP (last 3 results) No results for input(s): PROBNP in the last 8760 hours. HbA1C: No results for input(s): HGBA1C in the last 72 hours. CBG: No results for input(s): GLUCAP in the last 168 hours. Lipid Profile: No results for input(s): CHOL, HDL, LDLCALC, TRIG, CHOLHDL, LDLDIRECT in the last 72 hours. Thyroid Function Tests: No results for input(s): TSH, T4TOTAL, FREET4, T3FREE, THYROIDAB in the last 72 hours. Anemia Panel: No results for input(s): VITAMINB12, FOLATE, FERRITIN, TIBC, IRON, RETICCTPCT in the last 72 hours.  --------------------------------------------------------------------------------------------------------------- Urine analysis:    Component Value Date/Time   COLORURINE YELLOW 05/03/2017 1220   APPEARANCEUR HAZY (A) 05/03/2017 1220   LABSPEC 1.023 05/03/2017 1220   PHURINE 5.0 05/03/2017 1220   GLUCOSEU 150 (A) 05/03/2017 1220   HGBUR NEGATIVE 05/03/2017 1220   BILIRUBINUR NEGATIVE 05/03/2017 1220   KETONESUR NEGATIVE 05/03/2017 1220    PROTEINUR >=300 (A) 05/03/2017 1220   UROBILINOGEN 1.0 10/10/2014 1120   NITRITE NEGATIVE 05/03/2017 1220   LEUKOCYTESUR TRACE (A) 05/03/2017 1220      Imaging Results:    Dg Chest Port 1 View  Result Date: 08/19/2018 CLINICAL DATA:  Shortness of breath EXAM: PORTABLE CHEST 1 VIEW COMPARISON:  05/03/2017 FINDINGS: Cardiomegaly with vascular congestion. Bilateral interstitial and airspace opacities concerning for edema. Small bilateral pleural effusions and bibasilar atelectasis. IMPRESSION: Cardiomegaly, mild pulmonary edema. Small effusions  with bibasilar atelectasis. Electronically Signed   By: Rolm Baptise M.D.   On: 08/19/2018 22:46   ekg ventricular bigeminy   Assessment & Plan:    Principal Problem:   Acute CHF (congestive heart failure) (HCC) Active Problems:   Uncontrolled type 2 diabetes mellitus with complication, with long-term current use of insulin (HCC)   Primary hypothyroidism   Malignant hypertension   Hypokalemia   Anemia   Acute renal failure superimposed on stage 3 chronic kidney disease (HCC)  Acute CHF secondary to hypertensive emergency?  Tele Trop I q6h x3 Check TSH Check cardiac echo Cont Bipap Nitro gtt Hydralazine 56m po tid Spironolactone 249mpo qday Lasix 2079mv qday  Hypertensive emergency Nitro gtt Cont clonidine Start hydralazine as above Start spironolactone as above Start Lasix as above  Elevated troponin ? Due to hypertensive emergency Tele Cycle troponin Check cardiac echo as above  Hypokalemia Replete, start spironlactone Check cmp in am  ARF on stage 3 CKD STOP Metformin Decrease Lantus 30 units [place] qday fsbs ac and qhs, ISS  Diabetic neuropathy Cont Gabapentin 100m59m bid  Hyperlipidemia Cont Simvastatin  Hypothyroidism Check tsh Cont levothyroxine 137mi31mrams po qday  ? Mood disorder Cont Geodon Cont Topiramate  Abnormal liver function Check acute hepatitis panel Check ferritin, iron, tibc to check  for hemochromatosis which could cause diabetes, and abnormal liver function and also chf Consider stopping Simvastatin Check RUQ ulttrasound  Anemia Check cbc in am Consider check b12, folate, spep, upep    DVT Prophylaxis-   Lovenox - SCDs  AM Labs Ordered, also please review Full Orders  Family Communication: Admission, patients condition and plan of care including tests being ordered have been discussed with the patient  who indicate understanding and agree with the plan and Code Status.  Code Status:  FULL CODE, attempted call to husband BobbyHayla [phone] is number in computer, no one picks up and there is no voicemail.   Admission status: Inpatient: Based on patients clinical presentation and evaluation of above clinical data, I have made determination that patient meets Inpatient criteria at this time.  Pt will require iv lasix for acute CHF.  Pt will need close monitoring of her creatinine due to baseline CKD stage3, and currently with mild arf. Due to the complex nature of her medical condition, pt will require > 2 nites stay and inpatient status.   Time spent in minutes : 70   JamesJani Gravelon 08/20/2018 at 12:21 AM

## 2018-08-20 NOTE — TOC Initial Note (Signed)
Transition of Care [facility]) - Initial/Assessment Note    Patient Details  Name: Holly Baldwin MRN: [id number] Date of Birth: [birth year]  [facility]) CM/SW Contact:    Ihor Gully, LCSW Phone Number: 08/20/2018, 2:42 PM  Clinical Narrative:                 Patient deferred to spouse. Patient has no DME in the home. No hx. Of HH. Patient/spouse is agreeable to [facility]. Patient is independent in ADLs at baseline. She does not drive. Referral made to choice.  Expected Discharge Plan: [place] Barriers to Discharge: No Barriers Identified   Patient Goals and CMS Choice Patient states their goals for this hospitalization and ongoing recovery are:: To return home and feel better CMS Medicare.gov Compare Post Acute Care list provided to:: Other (Comment Required)(spouse) Choice offered to / list presented to : Spouse  Expected Discharge Plan and Services Expected Discharge Plan: [place]   Discharge Planning Services: CM Consult Post Acute Care Choice: [place] arrangements for the past 2 months: Single Family Home                           HH Arranged: RN [place] Agency: [place] ([place]) Date [place]: 08/20/18 Time [place]: [place] Representative spoke with at [place]: Romualdo Bolk  Prior Living Arrangements/Services Living arrangements for the past 2 months: [place] with:: Spouse Patient language and need for interpreter reviewed:: Yes Do you feel safe going back to the place where you live?: Yes      Need for Family Participation in Patient Care: Yes (Comment) Care giver support system in place?: Yes (comment)   Criminal Activity/Legal Involvement Pertinent to Current Situation/Hospitalization: No - Comment as needed  Activities of Daily Living Home Assistive Devices/Equipment: None ADL Screening (condition at time of admission) Patient's cognitive ability  adequate to safely complete daily activities?: Yes Is the patient deaf or have difficulty hearing?: No Does the patient have difficulty seeing, even when wearing glasses/contacts?: No Does the patient have difficulty concentrating, remembering, or making decisions?: Yes Patient able to express need for assistance with ADLs?: Yes Does the patient have difficulty dressing or bathing?: No Independently performs ADLs?: Yes (appropriate for developmental age) Does the patient have difficulty walking or climbing stairs?: Yes Weakness of Legs: Both Weakness of Arms/Hands: None  Permission Sought/Granted Permission sought to share information with : PCP                Emotional Assessment Appearance:: Appears stated age   Affect (typically observed): Accepting Orientation: : Oriented to Self, Oriented to Place, Oriented to  Time, Oriented to Situation Alcohol / Substance Use: Not Applicable Psych Involvement: No (comment)  Admission diagnosis:  ARF (acute renal failure) (HCC) [N17.9] SOB (shortness of breath) [R06.02] Abnormal liver function [R94.5] Acute respiratory failure with hypoxia (HCC) [J96.01] Hypertensive emergency [I16.1] Patient Active Problem List   Diagnosis Date Noted  . Acute CHF (congestive heart failure) ([place]) 08/20/2018  . Hypokalemia 08/20/2018  . Anemia 08/20/2018  . Acute renal failure superimposed on stage 3 chronic kidney disease ([place]) 08/20/2018  . Acute CHF ([place]) 08/20/2018  . Acute respiratory failure with hypoxia ([place])   . Vitamin D deficiency 03/17/2017  . Class 2 severe obesity due to excess calories with serious comorbidity and body mass index (BMI) of 38.0 to 38.9 in adult (  [place]) 07/10/2015  . Essential hypertension, benign 01/02/2015  . Mixed hyperlipidemia 01/02/2015  . Unspecified gastritis and gastroduodenitis without mention of hemorrhage 05/06/2012  . Abdominal pain 05/05/2012  . Nausea & vomiting 05/05/2012  . Depression 05/05/2012  . GERD  (gastroesophageal reflux disease) 05/05/2012  . Uncontrolled type 2 diabetes mellitus with complication, with long-term current use of insulin ([place]) 05/05/2012  . Primary hypothyroidism 05/05/2012  . Hiatal hernia 05/05/2012  . Malignant hypertension 05/05/2012  . SHOULDER PAIN 03/25/2007  . IMPINGEMENT SYNDROME 03/25/2007  . RUPTURE ROTATOR CUFF 03/25/2007  . HIGH BLOOD PRESSURE 03/25/2007   PCP:  Sharilyn Sites, MD Pharmacy:   [facility] [place] [address] [place] [address] Phone: [phone] Fax: [phone]  [facility] ([place]) [place], [place] [address] Phone: [phone] Fax: [phone]  [facility] [place], [place] [address] Phone: [phone] Fax: [phone]     Social Determinants of Health (SDOH) Interventions    Readmission Risk Interventions No flowsheet data found.

## 2018-08-20 NOTE — Progress Notes (Addendum)
PROGRESS NOTE  Holly Baldwin YBO:[id number] DOB: 18-Sep-1945 DOA: 08/19/2018 PCP: Sharilyn Sites, MD  Brief History:  73 year old female with a history of hypertension, hyperlipidemia, diabetes mellitus, anxiety, GERD presenting with 2-week history of shortness of breath, lower extremity edema, and intermittent chest discomfort.  The patient also complained of orthopnea.  She denied fevers, chills, coughing, hemoptysis, nausea, vomiting, diarrhea, abdominal pain, headache, neck pain. The patient has poor health literacy, and there has been some confusion regarding her home medications.  When reviewing her medication bottles, the patient confirmed that she was taking both diltiazem 240 mg CD and diltiazem 360 mg CD.  In addition, the patient has been taking Geodon 60 mg at bedtime along with a second bottle of Geodon 80 mg at bedtime.  Upon presentation, the patient was noted to be mildly hypoxic.  Chest x-ray showed increased interstitial markings and small bilateral effusions.  Patient was started on intravenous furosemide.  She was noted to be pericardiac and 230s.  Cardiology was consulted to assist with management.  Assessment/Plan: Acute CHF, type unspecified -Work-up in progress -Echocardiogram -accurate I/Os -Continue IV furosemide  Acute Respiratory Failure with hypoxia -due to pulmonary edema -stable on 3L -wean to RA for saturation >92$  Bradycardia -May be partly due to the patient's taking too much diltiazem -Consult cardiology -TSH 2.209 -hold diltiazem  Hypertensive urgency -Patient initially placed on nitroglycerin drip -Discontinue nitroglycerin drip -Continue hydralazine -Holding clonidine as this can exacerbate bradycardia -Continue spironolactone -Holding diltiazem temporarily and monitor heart rate  Acute on chronic renal failure--CKD stage III -Baseline creatinine 1.6-1.9 -Suspect the patient may have progression of underlying kidney disease -Monitor  with diuresis -A.m. BMP  Diabetes mellitus type 2 -Check hemoglobin A1c -Holding metformin -NovoLog sliding scale -Continue reduced dose Lantus  Hyperlipidemia -Continue statin  Depression/anxiety -Continue Geodon -Restart fluoxetine  Hypothyroidism -Continue Synthroid  Hypokalemia -replete -check mag     Disposition Plan:   Home in 2-3 days  Family Communication:   Family at bedside  Consultants:  Cardiology  Code Status:  FULL   DVT Prophylaxis:  [place] Lovenox   Procedures: As Listed in Progress Note Above  Antibiotics: None   Total time spent 35 minutes.  Greater than 50% spent face to face counseling and coordinating care. 0930 to 1005     Subjective: She is breathing a little better but still has orthopnea and dyspnea on exertion.  No f/c, cp, n/v/d, abd pain, dysuria  Objective: Vitals:   08/20/18 0515 08/20/18 0530 08/20/18 0545 08/20/18 0600  BP: (!) 131/47 (!) 128/47 (!) 127/47 (!) 124/46  Pulse: (!) 50 (!) 47 (!) 46 (!) 44  Resp: 17 16 16 15   Temp:      TempSrc:      SpO2: 100% 100% 100% 100%  Weight:    104.2 kg  Height:        Intake/Output Summary (Last 24 hours) at 08/20/2018 1004 Last data filed at 08/20/2018 0300 Gross per 24 hour  Intake 187.74 ml  Output -  Net 187.74 ml   Weight change:  Exam:   General:  Pt is alert, follows commands appropriately, not in acute distress  HEENT: No icterus, No thrush, No neck mass, [place]/AT  Cardiovascular: RRR, S1/S2, no rubs, no gallops  Respiratory: bibasilar crackles, no wheeze  Abdomen: Soft/+BS, non tender, non distended, no guarding  Extremities: 2+LE edema, No lymphangitis, No petechiae, No rashes, no synovitis   Data Reviewed:  I have personally reviewed following labs and imaging studies Basic Metabolic Panel: Recent Labs  Lab 08/19/18 2212 08/20/18 0435  NA 141 139  K 3.2* 3.5  CL 104 103  CO2 24 24  GLUCOSE 241* 365*  BUN 26* 29*  CREATININE 2.09* 2.36*   CALCIUM 8.5* 8.3*   Liver Function Tests: Recent Labs  Lab 08/19/18 2212 08/20/18 0435  AST 38 26  ALT 59* 47*  ALKPHOS 155* 134*  BILITOT 0.2* 0.5  PROT 6.4* 5.4*  ALBUMIN 3.1* 2.7*   Recent Labs  Lab 08/19/18 2212  LIPASE 24   No results for input(s): AMMONIA in the last 168 hours. Coagulation Profile: No results for input(s): INR, PROTIME in the last 168 hours. CBC: Recent Labs  Lab 08/19/18 2212 08/20/18 0435  WBC 12.5* 9.6  NEUTROABS 9.7*  --   HGB 10.7* 9.2*  HCT 34.8* 30.6*  MCV 86.1 87.7  PLT 276 239   Cardiac Enzymes: Recent Labs  Lab 08/19/18 2212 08/20/18 0435  TROPONINI 0.03* 0.06*   BNP: Invalid input(s): POCBNP CBG: Recent Labs  Lab 08/20/18 0244 08/20/18 0800  GLUCAP 305* 376*   HbA1C: No results for input(s): HGBA1C in the last 72 hours. Urine analysis:    Component Value Date/Time   COLORURINE YELLOW 05/03/2017 1220   APPEARANCEUR HAZY (A) 05/03/2017 1220   LABSPEC 1.023 05/03/2017 1220   PHURINE 5.0 05/03/2017 1220   GLUCOSEU 150 (A) 05/03/2017 1220   HGBUR NEGATIVE 05/03/2017 1220   BILIRUBINUR NEGATIVE 05/03/2017 1220   KETONESUR NEGATIVE 05/03/2017 1220   PROTEINUR >=300 (A) 05/03/2017 1220   UROBILINOGEN 1.0 10/10/2014 1120   NITRITE NEGATIVE 05/03/2017 1220   LEUKOCYTESUR TRACE (A) 05/03/2017 1220   Sepsis Labs: @LABRCNTIP (procalcitonin:4,lacticidven:4) ) Recent Results (from the past 240 hour(s))  SARS Coronavirus 2 (CEPHEID- Performed in [place] hospital lab), Hosp Order     Status: None   Collection Time: 08/19/18 10:00 PM   Specimen: Nasopharyngeal Swab  Result Value Ref Range Status   SARS Coronavirus 2 NEGATIVE NEGATIVE Final    Comment: (NOTE) If result is NEGATIVE SARS-CoV-2 target nucleic acids are NOT DETECTED. The SARS-CoV-2 RNA is generally detectable in upper and lower  respiratory specimens during the acute phase of infection. The lowest  concentration of SARS-CoV-2 viral copies this assay can  detect is 250  copies / mL. A negative result does not preclude SARS-CoV-2 infection  and should not be used as the sole basis for treatment or other  patient management decisions.  A negative result may occur with  improper specimen collection / handling, submission of specimen other  than nasopharyngeal swab, presence of viral mutation(s) within the  areas targeted by this assay, and inadequate number of viral copies  (<250 copies / mL). A negative result must be combined with clinical  observations, patient history, and epidemiological information. If result is POSITIVE SARS-CoV-2 target nucleic acids are DETECTED. The SARS-CoV-2 RNA is generally detectable in upper and lower  respiratory specimens dur ing the acute phase of infection.  Positive  results are indicative of active infection with SARS-CoV-2.  Clinical  correlation with patient history and other diagnostic information is  necessary to determine patient infection status.  Positive results do  not rule out bacterial infection or co-infection with other viruses. If result is PRESUMPTIVE POSTIVE SARS-CoV-2 nucleic acids MAY BE PRESENT.   A presumptive positive result was obtained on the submitted specimen  and confirmed on repeat testing.  While 2019 novel coronavirus  (  SARS-CoV-2) nucleic acids may be present in the submitted sample  additional confirmatory testing may be necessary for epidemiological  and / or clinical management purposes  to differentiate between  SARS-CoV-2 and other Sarbecovirus currently known to infect humans.  If clinically indicated additional testing with an alternate test  methodology [phone]) is advised. The SARS-CoV-2 RNA is generally  detectable in upper and lower respiratory sp ecimens during the acute  phase of infection. The expected result is Negative. Fact Sheet for Patients:  StrictlyIdeas.no Fact Sheet for Healthcare Providers:  BankingDealers.co.za This test is not yet approved or cleared by the [place] FDA and has been authorized for detection and/or diagnosis of SARS-CoV-2 by FDA under an Emergency Use Authorization (EUA).  This EUA will remain in effect (meaning this test can be used) for the duration of the COVID-19 declaration under Section 564(b)(1) of the Act, 21 U.S.C. section 360bbb-3(b)(1), unless the authorization is terminated or revoked sooner. Performed at [facility], [address] [place], [place] [address]   MRSA PCR Screening     Status: None   Collection Time: 08/20/18  1:27 AM   Specimen: Nasal Mucosa; Nasopharyngeal  Result Value Ref Range Status   MRSA by PCR NEGATIVE NEGATIVE Final    Comment:        The GeneXpert MRSA Assay (FDA approved for NASAL specimens only), is one component of a comprehensive MRSA colonization surveillance program. It is not intended to diagnose MRSA infection nor to guide or monitor treatment for MRSA infections. Performed at [facility], [address] [place], [place] [address]      Scheduled Meds: . aspirin EC  81 mg Oral QHS  . enoxaparin (LOVENOX) injection  30 mg Subcutaneous Q24H  . furosemide  20 mg Intravenous Daily  . gabapentin  100 mg Oral BID  . hydrALAZINE  50 mg Oral TID  . insulin aspart  0-5 Units Subcutaneous QHS  . insulin aspart  0-9 Units Subcutaneous TID WC  . insulin glargine  30 Units Subcutaneous Q2200  . levothyroxine  137 mcg Oral QAC breakfast  . mometasone-formoterol  2 puff Inhalation BID  . simvastatin  40 mg Oral Daily  . spironolactone  25 mg Oral Daily  . topiramate  100 mg Oral BID  . ziprasidone  80 mg Oral QHS   Continuous Infusions: . sodium chloride Stopped (08/20/18 0111)  . nitroGLYCERIN 185 mcg/min (08/20/18 0502)    Procedures/Studies: Dg Chest Port 1 View  Result Date: 08/19/2018 CLINICAL DATA:  Shortness of breath EXAM: PORTABLE CHEST 1 VIEW COMPARISON:  05/03/2017  FINDINGS: Cardiomegaly with vascular congestion. Bilateral interstitial and airspace opacities concerning for edema. Small bilateral pleural effusions and bibasilar atelectasis. IMPRESSION: Cardiomegaly, mild pulmonary edema. Small effusions with bibasilar atelectasis. Electronically Signed   By: Rolm Baptise M.D.   On: 08/19/2018 22:46    Orson Eva, DO  [facility] Pager [phone]  If 7PM-7AM, please contact night-coverage [email or web address] Password [email or web address] 08/20/2018, 10:04 AM   LOS: 0 days

## 2018-08-20 NOTE — Progress Notes (Signed)
BG at 1606 was 418. Dr Tat notified. Verbal order to give 20 units of insulin. 20 units given at 1623.

## 2018-08-20 NOTE — Progress Notes (Signed)
  Echocardiogram 2D Echocardiogram has been performed.  Holly Baldwin L Androw 08/20/2018, 10:56 AM

## 2018-08-20 NOTE — Progress Notes (Signed)
Patient transported from ED to ICU without any complications. 

## 2018-08-20 NOTE — Progress Notes (Signed)
Inpatient Diabetes Program Recommendations  AACE/ADA: New Consensus Statement on Inpatient Glycemic Control (2015)  Target Ranges:  Prepandial:   less than 140 mg/dL      Peak postprandial:   less than 180 mg/dL (1-2 hours)      Critically ill patients:  140 - 180 mg/dL   Lab Results  Component Value Date   GLUCAP 376 (H) 08/20/2018   HGBA1C 6.3 (H) 04/28/2018    Review of Glycemic Control Results for AFIFA, TRUAX (MRN [id number]) as of 08/20/2018 11:33  Ref. Range 08/20/2018 08:00  Glucose-Capillary Latest Ref Range: 70 - 99 mg/dL 376 (H)   Diabetes history: Type 2 DM Outpatient Diabetes medications: Lantus 50 units QAM, Metformin 500 mg QD Current orders for Inpatient glycemic control: Lantus 30 units QHS, Novolog 0-9 units TID, Novolog 0-5 units QHS Solumedrol 125 mg x 1  Inpatient Diabetes Program Recommendations:    Noted A1C ordered and pending. Last was 6.3% and patient is followed outpatient by Dr Dorris Fetch, endocrinology.  Noted new orders given admission. In the setting of steroids, glucose trends elevated. Thus, may also want to increase correction of Novolog 0-15 units TID and add Novolog 4 units TID (assuming patient is consuming >50% of meal and if post prandials exceeding 180 mg/dL).   Thanks, Bronson Curb, MSN, RNC-OB Diabetes Coordinator [phone] (8a-5p)

## 2018-08-21 ENCOUNTER — Inpatient Hospital Stay (HOSPITAL_COMMUNITY): Payer: Medicare HMO

## 2018-08-21 ENCOUNTER — Encounter (HOSPITAL_COMMUNITY): Payer: Self-pay | Admitting: Radiology

## 2018-08-21 ENCOUNTER — Other Ambulatory Visit: Payer: Self-pay | Admitting: "Endocrinology

## 2018-08-21 DIAGNOSIS — I161 Hypertensive emergency: Secondary | ICD-10-CM

## 2018-08-21 DIAGNOSIS — I5033 Acute on chronic diastolic (congestive) heart failure: Secondary | ICD-10-CM

## 2018-08-21 DIAGNOSIS — R945 Abnormal results of liver function studies: Secondary | ICD-10-CM

## 2018-08-21 DIAGNOSIS — J9601 Acute respiratory failure with hypoxia: Secondary | ICD-10-CM

## 2018-08-21 DIAGNOSIS — I5031 Acute diastolic (congestive) heart failure: Secondary | ICD-10-CM

## 2018-08-21 LAB — LIPID PANEL
Cholesterol: 162 mg/dL (ref 0–200)
HDL: 72 mg/dL (ref >40)
LDL Cholesterol: 69 mg/dL (ref 0–99)
Total CHOL/HDL Ratio: 2.3 RATIO
Triglycerides: 103 mg/dL (ref <150)
VLDL: 21 mg/dL (ref 0–40)

## 2018-08-21 LAB — BASIC METABOLIC PANEL
Anion gap: 10 (ref 5–15)
BUN: 36 mg/dL — ABNORMAL HIGH (ref 8–23)
CO2: 24 mmol/L (ref 22–32)
Calcium: 8.5 mg/dL — ABNORMAL LOW (ref 8.9–10.3)
Chloride: 100 mmol/L (ref 98–111)
Creatinine, Ser: 2.43 mg/dL — ABNORMAL HIGH (ref 0.44–1.00)
GFR calc Af Amer: 22 mL/min — ABNORMAL LOW (ref >60)
GFR calc non Af Amer: 19 mL/min — ABNORMAL LOW (ref >60)
Glucose, Bld: 254 mg/dL — ABNORMAL HIGH (ref 70–99)
Potassium: 4 mmol/L (ref 3.5–5.1)
Sodium: 134 mmol/L — ABNORMAL LOW (ref 135–145)

## 2018-08-21 LAB — HEPATITIS PANEL, ACUTE
HCV Ab: <0.1 s/co ratio (ref 0.0–0.9)
Hep A IgM: NEGATIVE
Hep B C IgM: NEGATIVE
Hepatitis B Surface Ag: NEGATIVE

## 2018-08-21 LAB — MAGNESIUM: Magnesium: 2.8 mg/dL — ABNORMAL HIGH (ref 1.7–2.4)

## 2018-08-21 LAB — GLUCOSE, CAPILLARY
Glucose-Capillary: 223 mg/dL — ABNORMAL HIGH (ref 70–99)
Glucose-Capillary: 304 mg/dL — ABNORMAL HIGH (ref 70–99)
Glucose-Capillary: 302 mg/dL — ABNORMAL HIGH (ref 70–99)
Glucose-Capillary: 342 mg/dL — ABNORMAL HIGH (ref 70–99)

## 2018-08-21 MED ORDER — IPRATROPIUM-ALBUTEROL 0.5-2.5 (3) MG/3ML IN SOLN
3.0000 mL | Freq: Three times a day (TID) | RESPIRATORY_TRACT | Status: DC
  Administered 2018-08-21 – 2018-08-23 (×8): 3 mL via RESPIRATORY_TRACT
  Filled 2018-08-21 (×8): qty 3

## 2018-08-21 MED ORDER — METOPROLOL TARTRATE 5 MG/5ML IV SOLN: 5.0000 mg | Freq: Four times a day (QID) | INTRAVENOUS | Status: DC | PRN

## 2018-08-21 MED ORDER — ACETAMINOPHEN 325 MG PO TABS: 650.0000 mg | ORAL_TABLET | Freq: Four times a day (QID) | ORAL | Status: DC | PRN

## 2018-08-21 MED ORDER — SIMVASTATIN 20 MG PO TABS
20.0000 mg | ORAL_TABLET | Freq: Every day | ORAL | Status: DC
  Administered 2018-08-22 – 2018-08-25 (×4): 20 mg via ORAL
  Filled 2018-08-21 (×4): qty 1

## 2018-08-21 MED ORDER — IPRATROPIUM-ALBUTEROL 0.5-2.5 (3) MG/3ML IN SOLN: 3.0000 mL | Freq: Three times a day (TID) | RESPIRATORY_TRACT | Status: DC

## 2018-08-21 MED ORDER — ISOSORBIDE MONONITRATE ER 60 MG PO TB24
30.0000 mg | ORAL_TABLET | Freq: Every day | ORAL | Status: DC
  Administered 2018-08-21 – 2018-08-25 (×5): 30 mg via ORAL
  Filled 2018-08-21 (×5): qty 1

## 2018-08-21 MED ORDER — FUROSEMIDE 10 MG/ML IJ SOLN
40.0000 mg | Freq: Two times a day (BID) | INTRAMUSCULAR | Status: DC
  Administered 2018-08-22 – 2018-08-24 (×5): 40 mg via INTRAVENOUS
  Filled 2018-08-21 (×5): qty 4

## 2018-08-21 MED ORDER — AMLODIPINE BESYLATE 5 MG PO TABS
7.5000 mg | ORAL_TABLET | Freq: Once | ORAL | Status: AC
  Administered 2018-08-21: 7.5 mg via ORAL
  Filled 2018-08-21: qty 2

## 2018-08-21 MED ORDER — FUROSEMIDE 10 MG/ML IJ SOLN
40.0000 mg | Freq: Once | INTRAMUSCULAR | Status: AC
  Administered 2018-08-21: 40 mg via INTRAVENOUS
  Filled 2018-08-21: qty 4

## 2018-08-21 MED ORDER — FUROSEMIDE 10 MG/ML IJ SOLN: 40.0000 mg | Freq: Two times a day (BID) | INTRAMUSCULAR | Status: DC

## 2018-08-21 MED ORDER — AMLODIPINE BESYLATE 5 MG PO TABS
10.0000 mg | ORAL_TABLET | Freq: Every day | ORAL | Status: DC
  Administered 2018-08-22 – 2018-08-25 (×4): 10 mg via ORAL
  Filled 2018-08-21 (×4): qty 2

## 2018-08-21 MED ORDER — ONDANSETRON HCL 4 MG/2ML IJ SOLN
4.0000 mg | Freq: Four times a day (QID) | INTRAMUSCULAR | Status: DC | PRN
  Filled 2018-08-21: qty 2

## 2018-08-21 MED ORDER — LABETALOL HCL 5 MG/ML IV SOLN
10.0000 mg | Freq: Once | INTRAVENOUS | Status: AC
  Administered 2018-08-21: 10 mg via INTRAVENOUS
  Filled 2018-08-21: qty 4

## 2018-08-21 NOTE — Progress Notes (Signed)
Pt c/o SOB. Sats are in the upper 80s. [place] was increased with no relief. Resp Therapist was called and BiPap was initiated once more. Patient no longer complaining of SOB will continue to monitor.

## 2018-08-21 NOTE — Progress Notes (Signed)
PROGRESS NOTE  Holly Baldwin TWS:[id number] DOB: 05-29-1945 DOA: 08/19/2018 PCP: Sharilyn Sites, MD   Brief History:  73 year old female with a history of hypertension, hyperlipidemia, diabetes mellitus, anxiety, GERD presenting with 2-week history of shortness of breath, lower extremity edema, and intermittent chest discomfort.  The patient also complained of orthopnea.  She denied fevers, chills, coughing, hemoptysis, nausea, vomiting, diarrhea, abdominal pain, headache, neck pain. The patient has poor health literacy, and there has been some confusion regarding her home medications.  When reviewing her medication bottles, the patient confirmed that she was taking both diltiazem 240 mg CD and diltiazem 360 mg CD.  In addition, the patient has been taking Geodon 60 mg at bedtime along with a second bottle of Geodon 80 mg at bedtime.  Upon presentation, the patient was noted to be mildly hypoxic.  Chest x-ray showed increased interstitial markings and small bilateral effusions.  Patient was started on intravenous furosemide.  She was noted to be pericardiac and 230s.  Cardiology was consulted to assist with management.  Assessment/Plan: Acute diastolic -Echocardiogram--EF 60-65%, severe LAE, RAE,+diastolic dysfunction -accurate I/Os -Continue IV furosemide--increase dose  Acute Respiratory Failure with hypoxia -due to pulmonary edema -stable on 3L -wean to RA for saturation >92%  Bradycardia -May be partly due to the patient's taking too much diltiazem -TSH 2.209 -hold diltiazem-->improving  Hypertensive urgency -Patient initially placed on nitroglycerin drip -Discontinued nitroglycerin drip -Continue hydralazine -start amlodipine -add imdur -Holding clonidine as this can exacerbate bradycardia -Continue spironolactone -Holding diltiazem temporarily and monitor heart rate  Acute on chronic renal failure--CKD stage III -Baseline creatinine 1.6-1.9 -Suspect the  patient may have progression of underlying kidney disease -will have to tolerate worse renal function for improved respiratory status -Monitor with diuresis -A.m. BMP  Diabetes mellitus type 2 -Check hemoglobin A1c--pending -Holding metformin -NovoLog sliding scale -Continue reduced dose Lantus  Hyperlipidemia -Continue statin  Depression/anxiety -Continue Geodon -Restart fluoxetine  Hypothyroidism -Continue Synthroid  Hypokalemia -repleted -check mag     Disposition Plan:   Home in 2-3 days  Family Communication:   Family at bedside  Consultants:  Cardiology  Code Status:  FULL   DVT Prophylaxis:  [place] Lovenox   Procedures: As Listed in Progress Note Above  Antibiotics: None   Total time spent 35 minutes.  Greater than 50% spent face to face counseling and coordinating care.    Subjective: Pt had some sob requiring BiPAP last night.  Denies f/c, cp, n/v/d, abd pain.  Still has orthopnea  Objective: Vitals:   08/21/18 0600 08/21/18 0700 08/21/18 0733 08/21/18 0913  BP: (!) 197/84 (!) 181/80  (!) 224/77  Pulse: (!) 56 (!) 49    Resp: 18 13    Temp:   97.8 F (36.6 C)   TempSrc:   Axillary   SpO2: 100% 100%    Weight:      Height:        Intake/Output Summary (Last 24 hours) at 08/21/2018 1034 Last data filed at 08/20/2018 1546 Gross per 24 hour  Intake 398.99 ml  Output 900 ml  Net -501.01 ml   Weight change:  Exam:   General:  Pt is alert, follows commands appropriately, not in acute distress  HEENT: No icterus, No thrush, No neck mass, [place]/AT  Cardiovascular: RRR, S1/S2, no rubs, no gallops  Respiratory: bilateral crackles, no wheeze  Abdomen: Soft/+BS, non tender, non distended, no guarding  Extremities: 1+  LE edema, No lymphangitis,  No petechiae, No rashes, no synovitis   Data Reviewed: I have personally reviewed following labs and imaging studies Basic Metabolic Panel: Recent Labs  Lab 08/19/18 2212  08/20/18 0435 08/21/18 0449  NA 141 139 134*  K 3.2* 3.5 4.0  CL 104 103 100  CO2 24 24 24   GLUCOSE 241* 365* 254*  BUN 26* 29* 36*  CREATININE 2.09* 2.36* 2.43*  CALCIUM 8.5* 8.3* 8.5*  MG  --   --  2.8*   Liver Function Tests: Recent Labs  Lab 08/19/18 2212 08/20/18 0435  AST 38 26  ALT 59* 47*  ALKPHOS 155* 134*  BILITOT 0.2* 0.5  PROT 6.4* 5.4*  ALBUMIN 3.1* 2.7*   Recent Labs  Lab 08/19/18 2212  LIPASE 24   No results for input(s): AMMONIA in the last 168 hours. Coagulation Profile: No results for input(s): INR, PROTIME in the last 168 hours. CBC: Recent Labs  Lab 08/19/18 2212 08/20/18 0435  WBC 12.5* 9.6  NEUTROABS 9.7*  --   HGB 10.7* 9.2*  HCT 34.8* 30.6*  MCV 86.1 87.7  PLT 276 239   Cardiac Enzymes: Recent Labs  Lab 08/19/18 2212 08/20/18 0435 08/20/18 1115 08/20/18 1648  TROPONINI 0.03* 0.06* 0.05* 0.04*   BNP: Invalid input(s): POCBNP CBG: Recent Labs  Lab 08/20/18 0800 08/20/18 1232 08/20/18 1606 08/20/18 2208 08/21/18 0735  GLUCAP 376* 398* 418* 269* 223*   HbA1C: No results for input(s): HGBA1C in the last 72 hours. Urine analysis:    Component Value Date/Time   COLORURINE YELLOW 05/03/2017 1220   APPEARANCEUR HAZY (A) 05/03/2017 1220   LABSPEC 1.023 05/03/2017 1220   PHURINE 5.0 05/03/2017 1220   GLUCOSEU 150 (A) 05/03/2017 1220   HGBUR NEGATIVE 05/03/2017 1220   BILIRUBINUR NEGATIVE 05/03/2017 1220   KETONESUR NEGATIVE 05/03/2017 1220   PROTEINUR >=300 (A) 05/03/2017 1220   UROBILINOGEN 1.0 10/10/2014 1120   NITRITE NEGATIVE 05/03/2017 1220   LEUKOCYTESUR TRACE (A) 05/03/2017 1220   Sepsis Labs: @LABRCNTIP (procalcitonin:4,lacticidven:4) ) Recent Results (from the past 240 hour(s))  SARS Coronavirus 2 (CEPHEID- Performed in [place] hospital lab), Hosp Order     Status: None   Collection Time: 08/19/18 10:00 PM   Specimen: Nasopharyngeal Swab  Result Value Ref Range Status   SARS Coronavirus 2 NEGATIVE  NEGATIVE Final    Comment: (NOTE) If result is NEGATIVE SARS-CoV-2 target nucleic acids are NOT DETECTED. The SARS-CoV-2 RNA is generally detectable in upper and lower  respiratory specimens during the acute phase of infection. The lowest  concentration of SARS-CoV-2 viral copies this assay can detect is 250  copies / mL. A negative result does not preclude SARS-CoV-2 infection  and should not be used as the sole basis for treatment or other  patient management decisions.  A negative result may occur with  improper specimen collection / handling, submission of specimen other  than nasopharyngeal swab, presence of viral mutation(s) within the  areas targeted by this assay, and inadequate number of viral copies  (<250 copies / mL). A negative result must be combined with clinical  observations, patient history, and epidemiological information. If result is POSITIVE SARS-CoV-2 target nucleic acids are DETECTED. The SARS-CoV-2 RNA is generally detectable in upper and lower  respiratory specimens dur ing the acute phase of infection.  Positive  results are indicative of active infection with SARS-CoV-2.  Clinical  correlation with patient history and other diagnostic information is  necessary to determine patient infection status.  Positive results do  not  rule out bacterial infection or co-infection with other viruses. If result is PRESUMPTIVE POSTIVE SARS-CoV-2 nucleic acids MAY BE PRESENT.   A presumptive positive result was obtained on the submitted specimen  and confirmed on repeat testing.  While 2019 novel coronavirus  (SARS-CoV-2) nucleic acids may be present in the submitted sample  additional confirmatory testing may be necessary for epidemiological  and / or clinical management purposes  to differentiate between  SARS-CoV-2 and other Sarbecovirus currently known to infect humans.  If clinically indicated additional testing with an alternate test  methodology [phone]) is  advised. The SARS-CoV-2 RNA is generally  detectable in upper and lower respiratory sp ecimens during the acute  phase of infection. The expected result is Negative. Fact Sheet for Patients:  StrictlyIdeas.no Fact Sheet for Healthcare Providers: BankingDealers.co.za This test is not yet approved or cleared by the [place] FDA and has been authorized for detection and/or diagnosis of SARS-CoV-2 by FDA under an Emergency Use Authorization (EUA).  This EUA will remain in effect (meaning this test can be used) for the duration of the COVID-19 declaration under Section 564(b)(1) of the Act, 21 U.S.C. section 360bbb-3(b)(1), unless the authorization is terminated or revoked sooner. Performed at [facility], [address], [place], [place] [address]   MRSA PCR Screening     Status: None   Collection Time: 08/20/18  1:27 AM   Specimen: Nasal Mucosa; Nasopharyngeal  Result Value Ref Range Status   MRSA by PCR NEGATIVE NEGATIVE Final    Comment:        The GeneXpert MRSA Assay (FDA approved for NASAL specimens only), is one component of a comprehensive MRSA colonization surveillance program. It is not intended to diagnose MRSA infection nor to guide or monitor treatment for MRSA infections. Performed at [facility], [address] [place], [place] [address]      Scheduled Meds: . [START ON 08/22/2018] amLODipine  10 mg Oral Daily  . amLODipine  7.5 mg Oral Once  . aspirin EC  81 mg Oral QHS  . enoxaparin (LOVENOX) injection  30 mg Subcutaneous Q24H  . FLUoxetine  20 mg Oral Daily  . furosemide  40 mg Intravenous Once  . [START ON 08/22/2018] furosemide  40 mg Intravenous BID  . gabapentin  100 mg Oral BID  . hydrALAZINE  100 mg Oral TID  . insulin aspart  0-15 Units Subcutaneous TID WC  . insulin aspart  0-5 Units Subcutaneous QHS  . insulin glargine  30 Units Subcutaneous Q2200  . isosorbide mononitrate  30 mg Oral Daily  .  levothyroxine  137 mcg Oral QAC breakfast  . mometasone-formoterol  2 puff Inhalation BID  . simvastatin  40 mg Oral Daily  . spironolactone  25 mg Oral Daily  . topiramate  100 mg Oral BID  . ziprasidone  80 mg Oral QHS   Continuous Infusions: . sodium chloride Stopped (08/20/18 0111)    Procedures/Studies: US Abdomen Complete  Result Date: 08/20/2018 CLINICAL DATA:  Abnormal LFTs; history hypertension, GERD, diabetes mellitus, hyperlipidemia EXAM: ABDOMEN ULTRASOUND COMPLETE COMPARISON:  CT abdomen and pelvis 06/02/2012 FINDINGS: Gallbladder: Surgically absent Common bile duct: Diameter: 5 mm diameter, normal Liver: Echogenic parenchyma, likely fatty infiltration though this can be seen with cirrhosis and certain infiltrative disorders. No focal hepatic mass or nodularity. Portal vein is patent on color Doppler imaging with normal direction of blood flow towards the liver. IVC: Normal appearance Pancreas: Pancreatic head through proximal tail normal appearance, distal tail obscured by  bowel gas Spleen: Normal appearance, 5.5 cm length Right Kidney: Length: 10.3 cm. Normal cortical thickness. Mildly increased cortical echogenicity. No mass or hydronephrosis. Left Kidney: Length: 10.3 cm. Normal cortical thickness. Diffusely increased cortical echogenicity. No mass, hydronephrosis or shadowing calcification. Abdominal aorta: Suboptimally visualized due to bowel gas, visualized proximal portion normal caliber Other findings: No free fluid. Small BILATERAL pleural effusions noted. IMPRESSION: Post cholecystectomy with probable fatty infiltration of liver as above. Incomplete visualization of pancreatic tail and aorta. Medical renal disease changes of both kidneys. Small BILATERAL pleural effusions. Electronically Signed   By: Lavonia Dana M.D.   On: 08/20/2018 15:18   Dg Chest Port 1 View  Result Date: 08/19/2018 CLINICAL DATA:  Shortness of breath EXAM: PORTABLE CHEST 1 VIEW COMPARISON:  05/03/2017  FINDINGS: Cardiomegaly with vascular congestion. Bilateral interstitial and airspace opacities concerning for edema. Small bilateral pleural effusions and bibasilar atelectasis. IMPRESSION: Cardiomegaly, mild pulmonary edema. Small effusions with bibasilar atelectasis. Electronically Signed   By: Rolm Baptise M.D.   On: 08/19/2018 22:46    Orson Eva, DO  [facility] Pager [phone]  If 7PM-7AM, please contact night-coverage [email or web address] Password [email or web address] 08/21/2018, 10:34 AM   LOS: 1 day

## 2018-08-21 NOTE — Progress Notes (Signed)
Pt has been having elevated BPs (systolic over 549) MD was informed throughout the night. IV hydralazine, PO hydralazine & PO Clonidine were all give at different times of the night. Systolic are below 826 now. Will continue to monitor patient.

## 2018-08-22 LAB — GLUCOSE, CAPILLARY
Glucose-Capillary: 180 mg/dL — ABNORMAL HIGH (ref 70–99)
Glucose-Capillary: 336 mg/dL — ABNORMAL HIGH (ref 70–99)
Glucose-Capillary: 296 mg/dL — ABNORMAL HIGH (ref 70–99)
Glucose-Capillary: 231 mg/dL — ABNORMAL HIGH (ref 70–99)

## 2018-08-22 LAB — URINALYSIS, ROUTINE W REFLEX MICROSCOPIC
Bilirubin Urine: NEGATIVE
Glucose, UA: 50 mg/dL — AB
Hgb urine dipstick: NEGATIVE
Ketones, ur: NEGATIVE mg/dL
Nitrite: NEGATIVE
Protein, ur: >=300 mg/dL — AB
Specific Gravity, Urine: 1.007 (ref 1.005–1.030)
pH: 7 (ref 5.0–8.0)

## 2018-08-22 LAB — BASIC METABOLIC PANEL
Anion gap: 8 (ref 5–15)
BUN: 34 mg/dL — ABNORMAL HIGH (ref 8–23)
CO2: 26 mmol/L (ref 22–32)
Calcium: 8.4 mg/dL — ABNORMAL LOW (ref 8.9–10.3)
Chloride: 102 mmol/L (ref 98–111)
Creatinine, Ser: 2.41 mg/dL — ABNORMAL HIGH (ref 0.44–1.00)
GFR calc Af Amer: 22 mL/min — ABNORMAL LOW (ref >60)
GFR calc non Af Amer: 19 mL/min — ABNORMAL LOW (ref >60)
Glucose, Bld: 228 mg/dL — ABNORMAL HIGH (ref 70–99)
Potassium: 3.7 mmol/L (ref 3.5–5.1)
Sodium: 136 mmol/L (ref 135–145)

## 2018-08-22 LAB — CBC
HCT: 33.3 % — ABNORMAL LOW (ref 36.0–46.0)
Hemoglobin: 9.9 g/dL — ABNORMAL LOW (ref 12.0–15.0)
MCH: 26.1 pg (ref 26.0–34.0)
MCHC: 29.7 g/dL — ABNORMAL LOW (ref 30.0–36.0)
MCV: 87.9 fL (ref 80.0–100.0)
Platelets: 275 10*3/uL (ref 150–400)
RBC: 3.79 MIL/uL — ABNORMAL LOW (ref 3.87–5.11)
RDW: 15.9 % — ABNORMAL HIGH (ref 11.5–15.5)
WBC: 14.5 10*3/uL — ABNORMAL HIGH (ref 4.0–10.5)
nRBC: 0 % (ref 0.0–0.2)

## 2018-08-22 LAB — PROCALCITONIN: Procalcitonin: 0.19 ng/mL

## 2018-08-22 LAB — BRAIN NATRIURETIC PEPTIDE: B Natriuretic Peptide: 573 pg/mL — ABNORMAL HIGH (ref 0.0–100.0)

## 2018-08-22 MED ORDER — SODIUM CHLORIDE 0.9 % IV SOLN
125.0000 mg | Freq: Once | INTRAVENOUS | Status: AC
  Administered 2018-08-22: 125 mg via INTRAVENOUS
  Filled 2018-08-22: qty 10

## 2018-08-22 MED ORDER — CARVEDILOL 3.125 MG PO TABS
6.2500 mg | ORAL_TABLET | Freq: Two times a day (BID) | ORAL | Status: DC
  Administered 2018-08-22: 6.25 mg via ORAL
  Filled 2018-08-22 (×2): qty 2

## 2018-08-22 MED ORDER — INSULIN ASPART 100 UNIT/ML ~~LOC~~ SOLN
0.0000 [IU] | Freq: Three times a day (TID) | SUBCUTANEOUS | Status: DC
  Administered 2018-08-22: 11 [IU] via SUBCUTANEOUS
  Administered 2018-08-23 (×3): 4 [IU] via SUBCUTANEOUS
  Administered 2018-08-24: 3 [IU] via SUBCUTANEOUS
  Administered 2018-08-24: 7 [IU] via SUBCUTANEOUS
  Administered 2018-08-25: 3 [IU] via SUBCUTANEOUS
  Administered 2018-08-25: 7 [IU] via SUBCUTANEOUS

## 2018-08-22 MED ORDER — INSULIN GLARGINE 100 UNIT/ML ~~LOC~~ SOLN
40.0000 [IU] | Freq: Every day | SUBCUTANEOUS | Status: DC
  Administered 2018-08-22 – 2018-08-23 (×2): 40 [IU] via SUBCUTANEOUS
  Filled 2018-08-22 (×4): qty 0.4

## 2018-08-22 MED ORDER — INSULIN ASPART 100 UNIT/ML ~~LOC~~ SOLN
5.0000 [IU] | Freq: Three times a day (TID) | SUBCUTANEOUS | Status: DC
  Administered 2018-08-22 – 2018-08-23 (×2): 5 [IU] via SUBCUTANEOUS

## 2018-08-22 MED ORDER — CLONIDINE HCL 0.1 MG PO TABS
0.1000 mg | ORAL_TABLET | Freq: Three times a day (TID) | ORAL | Status: DC
  Administered 2018-08-22: 0.1 mg via ORAL
  Filled 2018-08-22 (×2): qty 1

## 2018-08-22 MED ORDER — INSULIN ASPART 100 UNIT/ML ~~LOC~~ SOLN
0.0000 [IU] | Freq: Every day | SUBCUTANEOUS | Status: DC
  Administered 2018-08-22: 2 [IU] via SUBCUTANEOUS

## 2018-08-22 NOTE — Progress Notes (Signed)
MD Tat notified of BP reading 203/75 with HR at 66. PRN Hydralazine given at 0354. Per MD give Imdur, hydralazine, and amlodipine early. Monitor bilateral BPs before and after medication.

## 2018-08-22 NOTE — Progress Notes (Signed)
PROGRESS NOTE  Holly Baldwin EYC:[id number] DOB: 20-Oct-1945 DOA: 08/19/2018 PCP: Sharilyn Sites, MD  Brief History: 73 year old female with a history of hypertension, hyperlipidemia, diabetes mellitus, anxiety, GERD presenting with 2-week history of shortness of breath, lower extremity edema, and intermittent chest discomfort. The patient also complained of orthopnea. She denied fevers, chills, coughing, hemoptysis, nausea, vomiting, diarrhea, abdominal pain, headache, neck pain. The patient has poor health literacy, and there has been some confusion regarding her home medications.When reviewing her medication bottles, the patient confirmed that she was taking both diltiazem 240 mg CD and diltiazem 360 mg CD. In addition, the patient has been taking Geodon 60 mg at bedtime along with a second bottle of Geodon 80 mg at bedtime. Upon presentation, the patient was noted to be mildly hypoxic. Chest x-ray showed increased interstitial markings and small bilateral effusions. Patient was started on intravenous furosemide. She was noted to be bradycardic in the 30s.  Her diltiazem was discontinued with improvement of HR.    Assessment/Plan: Acute diastolic CHF -Echocardiogram--EF 60-65%, severe LAE, RAE,+diastolic dysfunction -accurate I/Os--incomplete ?NEG 4L -Continue IV furosemide--increase dose to bid  Acute Respiratory Failure with hypoxia -due to pulmonary edema -stable on 3L>>>1L -wean to RA for saturation >92%  Bradycardia -May be partly due to the patient's taking too much diltiazem -TSH 2.209 -hold diltiazem-->improved  Malignant HTN -Patient initially placed on nitroglycerin drip -Discontinued nitroglycerin drip -Continue hydralazine -Increased amlodipine -add imdur -Holding clonidine as this can exacerbate bradycardia -Continue spironolactone -start coreg -check renin/aldosterone ratio -renal artery duplex -24 hour urine for metanephrines  Acute on  chronic renal failure--CKD stage III -Baseline creatinine 1.6-1.9 -Suspect the patient may have progression of underlying kidney disease -will have to tolerate worse renal function for improved respiratory status -Monitor with diuresis -A.m. BMP  Diabetes mellitus type 2 -Check hemoglobin A1c--pending -Holding metformin -NovoLog sliding scale -Increase Lantus to 40 units -add novolog 5 units with meals  Hyperlipidemia -Continue statin  Depression/anxiety -Continue Geodon -Restart fluoxetine  Hypothyroidism -Continue Synthroid  Hypokalemia -repleted -check mag     Disposition Plan: Home in 1-2 days  Family Communication: Family at bedside  Consultants:Cardiology  Code Status: FULL   DVT Prophylaxis: [place] Lovenox   Procedures: As Listed in Progress Note Above  Antibiotics: None   Total time spent 35 minutes. Greater than 50% spent face to face counseling and coordinating care.     Subjective: Patient feels that her breathing is better today.  She denies any fevers, chills, chest pain, nausea, vomiting, diarrhea, abdominal pain.  There is no dysuria or hematuria.  Objective: Vitals:   08/22/18 1400 08/22/18 1410 08/22/18 1430 08/22/18 1551  BP: (!) 195/93 (!) 231/98 (!) 208/80 (!) 229/72  Pulse: 82 80 75   Resp: 13 19 18    Temp:      TempSrc:      SpO2: 99% 98% 98%   Weight:      Height:        Intake/Output Summary (Last 24 hours) at 08/22/2018 1609 Last data filed at 08/22/2018 1600 Gross per 24 hour  Intake 360 ml  Output 3575 ml  Net -3215 ml   Weight change:  Exam:   General:  Pt is alert, follows commands appropriately, not in acute distress  HEENT: No icterus, No thrush, No neck mass, [place]/AT  Cardiovascular: RRR, S1/S2, no rubs, no gallops  Respiratory: bibasilar rales, no wheeze  Abdomen: Soft/+BS, non tender, non distended, no guarding  Extremities: 1+LE edema, No lymphangitis, No petechiae, No  rashes, no synovitis   Data Reviewed: I have personally reviewed following labs and imaging studies Basic Metabolic Panel: Recent Labs  Lab 08/19/18 2212 08/20/18 0435 08/21/18 0449 08/22/18 0356  NA 141 139 134* 136  K 3.2* 3.5 4.0 3.7  CL 104 103 100 102  CO2 24 24 24 26   GLUCOSE 241* 365* 254* 228*  BUN 26* 29* 36* 34*  CREATININE 2.09* 2.36* 2.43* 2.41*  CALCIUM 8.5* 8.3* 8.5* 8.4*  MG  --   --  2.8*  --    Liver Function Tests: Recent Labs  Lab 08/19/18 2212 08/20/18 0435  AST 38 26  ALT 59* 47*  ALKPHOS 155* 134*  BILITOT 0.2* 0.5  PROT 6.4* 5.4*  ALBUMIN 3.1* 2.7*   Recent Labs  Lab 08/19/18 2212  LIPASE 24   No results for input(s): AMMONIA in the last 168 hours. Coagulation Profile: No results for input(s): INR, PROTIME in the last 168 hours. CBC: Recent Labs  Lab 08/19/18 2212 08/20/18 0435 08/22/18 0356  WBC 12.5* 9.6 14.5*  NEUTROABS 9.7*  --   --   HGB 10.7* 9.2* 9.9*  HCT 34.8* 30.6* 33.3*  MCV 86.1 87.7 87.9  PLT 276 239 275   Cardiac Enzymes: Recent Labs  Lab 08/19/18 2212 08/20/18 0435 08/20/18 1115 08/20/18 1648  TROPONINI 0.03* 0.06* 0.05* 0.04*   BNP: Invalid input(s): POCBNP CBG: Recent Labs  Lab 08/21/18 1114 08/21/18 1638 08/21/18 2210 08/22/18 0748 08/22/18 1115  GLUCAP 304* 302* 342* 180* 336*   HbA1C: No results for input(s): HGBA1C in the last 72 hours. Urine analysis:    Component Value Date/Time   COLORURINE STRAW (A) 08/22/2018 1036   APPEARANCEUR CLEAR 08/22/2018 1036   LABSPEC 1.007 08/22/2018 1036   PHURINE 7.0 08/22/2018 1036   GLUCOSEU 50 (A) 08/22/2018 1036   HGBUR NEGATIVE 08/22/2018 1036   BILIRUBINUR NEGATIVE 08/22/2018 1036   KETONESUR NEGATIVE 08/22/2018 1036   PROTEINUR >=300 (A) 08/22/2018 1036   UROBILINOGEN 1.0 10/10/2014 1120   NITRITE NEGATIVE 08/22/2018 1036   LEUKOCYTESUR SMALL (A) 08/22/2018 1036   Sepsis Labs: @LABRCNTIP (procalcitonin:4,lacticidven:4) ) Recent Results  (from the past 240 hour(s))  SARS Coronavirus 2 (CEPHEID- Performed in [place] hospital lab), Hosp Order     Status: None   Collection Time: 08/19/18 10:00 PM   Specimen: Nasopharyngeal Swab  Result Value Ref Range Status   SARS Coronavirus 2 NEGATIVE NEGATIVE Final    Comment: (NOTE) If result is NEGATIVE SARS-CoV-2 target nucleic acids are NOT DETECTED. The SARS-CoV-2 RNA is generally detectable in upper and lower  respiratory specimens during the acute phase of infection. The lowest  concentration of SARS-CoV-2 viral copies this assay can detect is 250  copies / mL. A negative result does not preclude SARS-CoV-2 infection  and should not be used as the sole basis for treatment or other  patient management decisions.  A negative result may occur with  improper specimen collection / handling, submission of specimen other  than nasopharyngeal swab, presence of viral mutation(s) within the  areas targeted by this assay, and inadequate number of viral copies  (<250 copies / mL). A negative result must be combined with clinical  observations, patient history, and epidemiological information. If result is POSITIVE SARS-CoV-2 target nucleic acids are DETECTED. The SARS-CoV-2 RNA is generally detectable in upper and lower  respiratory specimens dur ing the acute phase of infection.  Positive  results are indicative of active  infection with SARS-CoV-2.  Clinical  correlation with patient history and other diagnostic information is  necessary to determine patient infection status.  Positive results do  not rule out bacterial infection or co-infection with other viruses. If result is PRESUMPTIVE POSTIVE SARS-CoV-2 nucleic acids MAY BE PRESENT.   A presumptive positive result was obtained on the submitted specimen  and confirmed on repeat testing.  While 2019 novel coronavirus  (SARS-CoV-2) nucleic acids may be present in the submitted sample  additional confirmatory testing may be  necessary for epidemiological  and / or clinical management purposes  to differentiate between  SARS-CoV-2 and other Sarbecovirus currently known to infect humans.  If clinically indicated additional testing with an alternate test  methodology [phone]) is advised. The SARS-CoV-2 RNA is generally  detectable in upper and lower respiratory sp ecimens during the acute  phase of infection. The expected result is Negative. Fact Sheet for Patients:  StrictlyIdeas.no Fact Sheet for Healthcare Providers: BankingDealers.co.za This test is not yet approved or cleared by the [place] FDA and has been authorized for detection and/or diagnosis of SARS-CoV-2 by FDA under an Emergency Use Authorization (EUA).  This EUA will remain in effect (meaning this test can be used) for the duration of the COVID-19 declaration under Section 564(b)(1) of the Act, 21 U.S.C. section 360bbb-3(b)(1), unless the authorization is terminated or revoked sooner. Performed at [facility], [address] [place], [place] [address]   MRSA PCR Screening     Status: None   Collection Time: 08/20/18  1:27 AM   Specimen: Nasal Mucosa; Nasopharyngeal  Result Value Ref Range Status   MRSA by PCR NEGATIVE NEGATIVE Final    Comment:        The GeneXpert MRSA Assay (FDA approved for NASAL specimens only), is one component of a comprehensive MRSA colonization surveillance program. It is not intended to diagnose MRSA infection nor to guide or monitor treatment for MRSA infections. Performed at [facility], [address] [place], [place] [address]      Scheduled Meds:  amLODipine  10 mg Oral Daily   aspirin EC  81 mg Oral QHS   carvedilol  6.25 mg Oral BID WC   enoxaparin (LOVENOX) injection  30 mg Subcutaneous Q24H   FLUoxetine  20 mg Oral Daily   furosemide  40 mg Intravenous BID   gabapentin  100 mg Oral BID   hydrALAZINE  100 mg Oral TID   insulin  aspart  0-20 Units Subcutaneous TID WC   insulin aspart  0-5 Units Subcutaneous QHS   insulin aspart  5 Units Subcutaneous TID WC   insulin glargine  40 Units Subcutaneous Q2200   ipratropium-albuterol  3 mL Nebulization TID   isosorbide mononitrate  30 mg Oral Daily   levothyroxine  137 mcg Oral QAC breakfast   mometasone-formoterol  2 puff Inhalation BID   simvastatin  20 mg Oral Daily   spironolactone  25 mg Oral Daily   topiramate  100 mg Oral BID   ziprasidone  80 mg Oral QHS   Continuous Infusions:  sodium chloride Stopped (08/20/18 0111)   ferric gluconate (FERRLECIT/NULECIT) IV 125 mg (08/22/18 1519)    Procedures/Studies: US Abdomen Complete  Result Date: 08/20/2018 CLINICAL DATA:  Abnormal LFTs; history hypertension, GERD, diabetes mellitus, hyperlipidemia EXAM: ABDOMEN ULTRASOUND COMPLETE COMPARISON:  CT abdomen and pelvis 06/02/2012 FINDINGS: Gallbladder: Surgically absent Common bile duct: Diameter: 5 mm diameter, normal Liver: Echogenic parenchyma, likely fatty infiltration though this can be seen with  cirrhosis and certain infiltrative disorders. No focal hepatic mass or nodularity. Portal vein is patent on color Doppler imaging with normal direction of blood flow towards the liver. IVC: Normal appearance Pancreas: Pancreatic head through proximal tail normal appearance, distal tail obscured by bowel gas Spleen: Normal appearance, 5.5 cm length Right Kidney: Length: 10.3 cm. Normal cortical thickness. Mildly increased cortical echogenicity. No mass or hydronephrosis. Left Kidney: Length: 10.3 cm. Normal cortical thickness. Diffusely increased cortical echogenicity. No mass, hydronephrosis or shadowing calcification. Abdominal aorta: Suboptimally visualized due to bowel gas, visualized proximal portion normal caliber Other findings: No free fluid. Small BILATERAL pleural effusions noted. IMPRESSION: Post cholecystectomy with probable fatty infiltration of liver as  above. Incomplete visualization of pancreatic tail and aorta. Medical renal disease changes of both kidneys. Small BILATERAL pleural effusions. Electronically Signed   By: Lavonia Dana M.D.   On: 08/20/2018 15:18   Dg Chest Port 1 View  Result Date: 08/21/2018 CLINICAL DATA:  Hypertension diabetes short of breath EXAM: PORTABLE CHEST 1 VIEW COMPARISON:  08/19/2018, 05/03/2017 FINDINGS: No significant interval change in cardiomegaly, vascular congestion and moderate pulmonary edema. Small bilateral pleural effusions as before. Slight worsening of basilar airspace disease. Aortic atherosclerosis. No pneumothorax. IMPRESSION: 1. Continued cardiomegaly with vascular congestion, moderate pulmonary edema and small pleural effusions. 2. Worsening aeration of the lung bases, may reflect atelectasis or superimposed pneumonia. Electronically Signed   By: Donavan Foil M.D.   On: 08/21/2018 16:29   Dg Chest Port 1 View  Result Date: 08/19/2018 CLINICAL DATA:  Shortness of breath EXAM: PORTABLE CHEST 1 VIEW COMPARISON:  05/03/2017 FINDINGS: Cardiomegaly with vascular congestion. Bilateral interstitial and airspace opacities concerning for edema. Small bilateral pleural effusions and bibasilar atelectasis. IMPRESSION: Cardiomegaly, mild pulmonary edema. Small effusions with bibasilar atelectasis. Electronically Signed   By: Rolm Baptise M.D.   On: 08/19/2018 22:46    Holly Eva, DO  [facility] Pager [phone]  If 7PM-7AM, please contact night-coverage [email or web address] Password [email or web address] 08/22/2018, 4:09 PM   LOS: 2 days

## 2018-08-22 NOTE — Progress Notes (Signed)
Pt's curlers removed from scalp per request. Placed curlers in a pt's belonging bag at bedside.

## 2018-08-23 LAB — CREATININE, URINE, 24 HOUR
Collection Interval-UCRE24: 24 hours
Creatinine, 24H Ur: 1260 mg/d (ref 600–1800)
Creatinine, Urine: 32.93 mg/dL
Urine Total Volume-UCRE24: 3825 mL

## 2018-08-23 LAB — HEMOGLOBIN A1C
Hgb A1c MFr Bld: 7.1 % — ABNORMAL HIGH (ref 4.8–5.6)
Mean Plasma Glucose: 157 mg/dL

## 2018-08-23 LAB — GLUCOSE, CAPILLARY
Glucose-Capillary: 153 mg/dL — ABNORMAL HIGH (ref 70–99)
Glucose-Capillary: 156 mg/dL — ABNORMAL HIGH (ref 70–99)
Glucose-Capillary: 124 mg/dL — ABNORMAL HIGH (ref 70–99)
Glucose-Capillary: 117 mg/dL — ABNORMAL HIGH (ref 70–99)

## 2018-08-23 LAB — BASIC METABOLIC PANEL
Anion gap: 10 (ref 5–15)
BUN: 30 mg/dL — ABNORMAL HIGH (ref 8–23)
CO2: 26 mmol/L (ref 22–32)
Calcium: 8.3 mg/dL — ABNORMAL LOW (ref 8.9–10.3)
Chloride: 103 mmol/L (ref 98–111)
Creatinine, Ser: 2.05 mg/dL — ABNORMAL HIGH (ref 0.44–1.00)
GFR calc Af Amer: 27 mL/min — ABNORMAL LOW (ref >60)
GFR calc non Af Amer: 23 mL/min — ABNORMAL LOW (ref >60)
Glucose, Bld: 138 mg/dL — ABNORMAL HIGH (ref 70–99)
Potassium: 3.3 mmol/L — ABNORMAL LOW (ref 3.5–5.1)
Sodium: 139 mmol/L (ref 135–145)

## 2018-08-23 LAB — CBC
HCT: 35.4 % — ABNORMAL LOW (ref 36.0–46.0)
Hemoglobin: 10.6 g/dL — ABNORMAL LOW (ref 12.0–15.0)
MCH: 26.1 pg (ref 26.0–34.0)
MCHC: 29.9 g/dL — ABNORMAL LOW (ref 30.0–36.0)
MCV: 87.2 fL (ref 80.0–100.0)
Platelets: 303 10*3/uL (ref 150–400)
RBC: 4.06 MIL/uL (ref 3.87–5.11)
RDW: 15.7 % — ABNORMAL HIGH (ref 11.5–15.5)
WBC: 11.6 10*3/uL — ABNORMAL HIGH (ref 4.0–10.5)
nRBC: 0 % (ref 0.0–0.2)

## 2018-08-23 LAB — URINE CULTURE

## 2018-08-23 LAB — MAGNESIUM: Magnesium: 2.2 mg/dL (ref 1.7–2.4)

## 2018-08-23 MED ORDER — CLONIDINE HCL 0.1 MG PO TABS
0.1000 mg | ORAL_TABLET | Freq: Once | ORAL | Status: AC
  Administered 2018-08-23: 0.1 mg via ORAL

## 2018-08-23 MED ORDER — POTASSIUM CHLORIDE CRYS ER 20 MEQ PO TBCR
40.0000 meq | EXTENDED_RELEASE_TABLET | Freq: Once | ORAL | Status: AC
  Administered 2018-08-23: 40 meq via ORAL
  Filled 2018-08-23: qty 2

## 2018-08-23 MED ORDER — INSULIN ASPART 100 UNIT/ML ~~LOC~~ SOLN
8.0000 [IU] | Freq: Three times a day (TID) | SUBCUTANEOUS | Status: DC
  Administered 2018-08-23 – 2018-08-25 (×6): 8 [IU] via SUBCUTANEOUS

## 2018-08-23 MED ORDER — BISACODYL 10 MG RE SUPP
10.0000 mg | Freq: Once | RECTAL | Status: AC
  Administered 2018-08-23: 10 mg via RECTAL
  Filled 2018-08-23: qty 1

## 2018-08-23 MED ORDER — NITROGLYCERIN IN D5W 200-5 MCG/ML-% IV SOLN: 0.0000 ug/min | INTRAVENOUS | Status: DC

## 2018-08-23 MED ORDER — CARVEDILOL 12.5 MG PO TABS
12.5000 mg | ORAL_TABLET | Freq: Two times a day (BID) | ORAL | Status: DC
  Administered 2018-08-23 – 2018-08-25 (×6): 12.5 mg via ORAL
  Filled 2018-08-23 (×6): qty 1

## 2018-08-23 MED ORDER — POLYETHYLENE GLYCOL 3350 17 G PO PACK
17.0000 g | PACK | Freq: Every day | ORAL | Status: DC
  Administered 2018-08-23 – 2018-08-25 (×3): 17 g via ORAL
  Filled 2018-08-23 (×3): qty 1

## 2018-08-23 MED ORDER — IPRATROPIUM-ALBUTEROL 0.5-2.5 (3) MG/3ML IN SOLN
3.0000 mL | Freq: Two times a day (BID) | RESPIRATORY_TRACT | Status: DC
  Administered 2018-08-24 – 2018-08-25 (×2): 3 mL via RESPIRATORY_TRACT
  Filled 2018-08-23 (×4): qty 3

## 2018-08-23 NOTE — Progress Notes (Signed)
PROGRESS NOTE  Holly Baldwin NTZ:[id number] DOB: April 09, 1945 DOA: 08/19/2018 PCP: Sharilyn Sites, MD Brief History: 73 year old female with a history of hypertension, hyperlipidemia, diabetes mellitus, anxiety, GERD presenting with 2-week history of shortness of breath, lower extremity edema, and intermittent chest discomfort. The patient also complained of orthopnea. She denied fevers, chills, coughing, hemoptysis, nausea, vomiting, diarrhea, abdominal pain, headache, neck pain. The patient has poor health literacy, and there has been some confusion regarding her home medications.When reviewing her medication bottles, the patient confirmed that she was taking both diltiazem 240 mg CD and diltiazem 360 mg CD. In addition, the patient has been taking Geodon 60 mg at bedtime along with a second bottle of Geodon 80 mg at bedtime. Upon presentation, the patient was noted to be mildly hypoxic. Chest x-ray showed increased interstitial markings and small bilateral effusions. Patient was started on intravenous furosemide. She was noted to be bradycardic in the 30s.  Her diltiazem was discontinued with improvement of HR.    Assessment/Plan: Acutediastolic CHF -Echocardiogram--EF 60-65%, severe LAE, RAE,+diastolic dysfunction -accurate I/Os--incomplete ?NEG 5.9L -Continue IV furosemide bid -remains clinically fluid overloaded  Acute Respiratory Failure with hypoxia -due to pulmonary edema -stable on 3L>>>1L>>RA -wean to RA for saturation >92%  Bradycardia -May be partly due to the patient's taking too much diltiazem -TSH 2.209 -hold diltiazem-->improved  Malignant HTN -Patient initially placed on nitroglycerin drip -Discontinuednitroglycerin drip -Continue hydralazine -Increased amlodipine -add imdur -Holding clonidine as this can exacerbate bradycardia -Continue spironolactone -increase coreg -check renin/aldosterone ratio -renal artery duplex -24 hour urine for  metanephrines  Acute on chronic renal failure--CKD stage III -Baseline creatinine 1.6-1.9 -Suspect the patient may have progression of underlying kidney disease -will have to tolerate worse renal function for improved respiratory status -Monitor with diuresis -A.m. BMP  Diabetes mellitus type 2 -Check hemoglobin A1c--pending -Holding metformin -NovoLog sliding scale -Increase Lantus to 40 units -increase novolog 8 units with meals  Hyperlipidemia -Continue statin  Depression/anxiety -Continue Geodon -Restart fluoxetine  Hypothyroidism -Continue Synthroid  Hypokalemia -repleted -check mag--2.2  Constipation -bisacodyl supp and miralax     Disposition Plan: Home in 1-2 days  Family Communication: Family at bedside  Consultants:Cardiology  Code Status: FULL   DVT Prophylaxis: [place] Lovenox   Procedures: As Listed in Progress Note Above  Antibiotics: None   Total time spent 35 minutes. Greater than 50% spent face to face counseling and coordinating care.     Subjective: Patient denies fevers, chills, headache, chest pain, dyspnea, nausea, vomiting, diarrhea, abdominal pain, dysuria, hematuria, hematochezia, and melena. Still having sob with lying flat  Objective: Vitals:   08/23/18 0630 08/23/18 0700 08/23/18 0730 08/23/18 0849  BP: (!) 181/65 (!) 168/62 (!) 171/70   Pulse: 66 70 80   Resp: 16 20 19    Temp:      TempSrc:      SpO2: 94% 100% 97% 97%  Weight:      Height:        Intake/Output Summary (Last 24 hours) at 08/23/2018 0913 Last data filed at 08/23/2018 0820 Gross per 24 hour  Intake 783.5 ml  Output 4375 ml  Net -3591.5 ml   Weight change: 0.2 kg Exam:   General:  Pt is alert, follows commands appropriately, not in acute distress  HEENT: No icterus, No thrush, No neck mass, [place]/AT  Cardiovascular: RRR, S1/S2, no rubs, no gallops  Respiratory: bibasilar crackles, no wheeze  Abdomen: Soft/+BS, non  tender, non distended,  no guarding  Extremities: 1+LE edema, No lymphangitis, No petechiae, No rashes, no synovitis   Data Reviewed: I have personally reviewed following labs and imaging studies Basic Metabolic Panel: Recent Labs  Lab 08/19/18 2212 08/20/18 0435 08/21/18 0449 08/22/18 0356 08/23/18 0422  NA 141 139 134* 136 139  K 3.2* 3.5 4.0 3.7 3.3*  CL 104 103 100 102 103  CO2 24 24 24 26 26   GLUCOSE 241* 365* 254* 228* 138*  BUN 26* 29* 36* 34* 30*  CREATININE 2.09* 2.36* 2.43* 2.41* 2.05*  CALCIUM 8.5* 8.3* 8.5* 8.4* 8.3*  MG  --   --  2.8*  --  2.2   Liver Function Tests: Recent Labs  Lab 08/19/18 2212 08/20/18 0435  AST 38 26  ALT 59* 47*  ALKPHOS 155* 134*  BILITOT 0.2* 0.5  PROT 6.4* 5.4*  ALBUMIN 3.1* 2.7*   Recent Labs  Lab 08/19/18 2212  LIPASE 24   No results for input(s): AMMONIA in the last 168 hours. Coagulation Profile: No results for input(s): INR, PROTIME in the last 168 hours. CBC: Recent Labs  Lab 08/19/18 2212 08/20/18 0435 08/22/18 0356 08/23/18 0422  WBC 12.5* 9.6 14.5* 11.6*  NEUTROABS 9.7*  --   --   --   HGB 10.7* 9.2* 9.9* 10.6*  HCT 34.8* 30.6* 33.3* 35.4*  MCV 86.1 87.7 87.9 87.2  PLT 276 239 275 303   Cardiac Enzymes: Recent Labs  Lab 08/19/18 2212 08/20/18 0435 08/20/18 1115 08/20/18 1648  TROPONINI 0.03* 0.06* 0.05* 0.04*   BNP: Invalid input(s): POCBNP CBG: Recent Labs  Lab 08/22/18 0748 08/22/18 1115 08/22/18 1648 08/22/18 2122 08/23/18 0747  GLUCAP 180* 336* 296* 231* 153*   HbA1C: No results for input(s): HGBA1C in the last 72 hours. Urine analysis:    Component Value Date/Time   COLORURINE STRAW (A) 08/22/2018 1036   APPEARANCEUR CLEAR 08/22/2018 1036   LABSPEC 1.007 08/22/2018 1036   PHURINE 7.0 08/22/2018 1036   GLUCOSEU 50 (A) 08/22/2018 1036   HGBUR NEGATIVE 08/22/2018 1036   BILIRUBINUR NEGATIVE 08/22/2018 1036   KETONESUR NEGATIVE 08/22/2018 1036   PROTEINUR >=300 (A) 08/22/2018  1036   UROBILINOGEN 1.0 10/10/2014 1120   NITRITE NEGATIVE 08/22/2018 1036   LEUKOCYTESUR SMALL (A) 08/22/2018 1036   Sepsis Labs: @LABRCNTIP (procalcitonin:4,lacticidven:4) ) Recent Results (from the past 240 hour(s))  SARS Coronavirus 2 (CEPHEID- Performed in [place] hospital lab), Hosp Order     Status: None   Collection Time: 08/19/18 10:00 PM   Specimen: Nasopharyngeal Swab  Result Value Ref Range Status   SARS Coronavirus 2 NEGATIVE NEGATIVE Final    Comment: (NOTE) If result is NEGATIVE SARS-CoV-2 target nucleic acids are NOT DETECTED. The SARS-CoV-2 RNA is generally detectable in upper and lower  respiratory specimens during the acute phase of infection. The lowest  concentration of SARS-CoV-2 viral copies this assay can detect is 250  copies / mL. A negative result does not preclude SARS-CoV-2 infection  and should not be used as the sole basis for treatment or other  patient management decisions.  A negative result may occur with  improper specimen collection / handling, submission of specimen other  than nasopharyngeal swab, presence of viral mutation(s) within the  areas targeted by this assay, and inadequate number of viral copies  (<250 copies / mL). A negative result must be combined with clinical  observations, patient history, and epidemiological information. If result is POSITIVE SARS-CoV-2 target nucleic acids are DETECTED. The SARS-CoV-2 RNA is generally  detectable in upper and lower  respiratory specimens dur ing the acute phase of infection.  Positive  results are indicative of active infection with SARS-CoV-2.  Clinical  correlation with patient history and other diagnostic information is  necessary to determine patient infection status.  Positive results do  not rule out bacterial infection or co-infection with other viruses. If result is PRESUMPTIVE POSTIVE SARS-CoV-2 nucleic acids MAY BE PRESENT.   A presumptive positive result was obtained on the  submitted specimen  and confirmed on repeat testing.  While 2019 novel coronavirus  (SARS-CoV-2) nucleic acids may be present in the submitted sample  additional confirmatory testing may be necessary for epidemiological  and / or clinical management purposes  to differentiate between  SARS-CoV-2 and other Sarbecovirus currently known to infect humans.  If clinically indicated additional testing with an alternate test  methodology [phone]) is advised. The SARS-CoV-2 RNA is generally  detectable in upper and lower respiratory sp ecimens during the acute  phase of infection. The expected result is Negative. Fact Sheet for Patients:  StrictlyIdeas.no Fact Sheet for Healthcare Providers: BankingDealers.co.za This test is not yet approved or cleared by the [place] FDA and has been authorized for detection and/or diagnosis of SARS-CoV-2 by FDA under an Emergency Use Authorization (EUA).  This EUA will remain in effect (meaning this test can be used) for the duration of the COVID-19 declaration under Section 564(b)(1) of the Act, 21 U.S.C. section 360bbb-3(b)(1), unless the authorization is terminated or revoked sooner. Performed at [facility], [address], [place], [place] [address]   MRSA PCR Screening     Status: None   Collection Time: 08/20/18  1:27 AM   Specimen: Nasal Mucosa; Nasopharyngeal  Result Value Ref Range Status   MRSA by PCR NEGATIVE NEGATIVE Final    Comment:        The GeneXpert MRSA Assay (FDA approved for NASAL specimens only), is one component of a comprehensive MRSA colonization surveillance program. It is not intended to diagnose MRSA infection nor to guide or monitor treatment for MRSA infections. Performed at [facility], [address], [place], [place] [address]      Scheduled Meds:  amLODipine  10 mg Oral Daily   aspirin EC  81 mg Oral QHS   bisacodyl  10 mg Rectal Once   carvedilol  12.5  mg Oral BID WC   enoxaparin (LOVENOX) injection  30 mg Subcutaneous Q24H   FLUoxetine  20 mg Oral Daily   furosemide  40 mg Intravenous BID   gabapentin  100 mg Oral BID   hydrALAZINE  100 mg Oral TID   insulin aspart  0-20 Units Subcutaneous TID WC   insulin aspart  0-5 Units Subcutaneous QHS   insulin aspart  8 Units Subcutaneous TID WC   insulin glargine  40 Units Subcutaneous Q2200   ipratropium-albuterol  3 mL Nebulization TID   isosorbide mononitrate  30 mg Oral Daily   levothyroxine  137 mcg Oral QAC breakfast   mometasone-formoterol  2 puff Inhalation BID   polyethylene glycol  17 g Oral Daily   simvastatin  20 mg Oral Daily   spironolactone  25 mg Oral Daily   topiramate  100 mg Oral BID   ziprasidone  80 mg Oral QHS   Continuous Infusions:  sodium chloride Stopped (08/20/18 0111)   nitroGLYCERIN Stopped (08/23/18 0511)    Procedures/Studies: US Abdomen Complete  Result Date: 08/20/2018 CLINICAL DATA:  Abnormal LFTs; history hypertension, GERD, diabetes mellitus,  hyperlipidemia EXAM: ABDOMEN ULTRASOUND COMPLETE COMPARISON:  CT abdomen and pelvis 06/02/2012 FINDINGS: Gallbladder: Surgically absent Common bile duct: Diameter: 5 mm diameter, normal Liver: Echogenic parenchyma, likely fatty infiltration though this can be seen with cirrhosis and certain infiltrative disorders. No focal hepatic mass or nodularity. Portal vein is patent on color Doppler imaging with normal direction of blood flow towards the liver. IVC: Normal appearance Pancreas: Pancreatic head through proximal tail normal appearance, distal tail obscured by bowel gas Spleen: Normal appearance, 5.5 cm length Right Kidney: Length: 10.3 cm. Normal cortical thickness. Mildly increased cortical echogenicity. No mass or hydronephrosis. Left Kidney: Length: 10.3 cm. Normal cortical thickness. Diffusely increased cortical echogenicity. No mass, hydronephrosis or shadowing calcification. Abdominal aorta:  Suboptimally visualized due to bowel gas, visualized proximal portion normal caliber Other findings: No free fluid. Small BILATERAL pleural effusions noted. IMPRESSION: Post cholecystectomy with probable fatty infiltration of liver as above. Incomplete visualization of pancreatic tail and aorta. Medical renal disease changes of both kidneys. Small BILATERAL pleural effusions. Electronically Signed   By: Lavonia Dana M.D.   On: 08/20/2018 15:18   Dg Chest Port 1 View  Result Date: 08/21/2018 CLINICAL DATA:  Hypertension diabetes short of breath EXAM: PORTABLE CHEST 1 VIEW COMPARISON:  08/19/2018, 05/03/2017 FINDINGS: No significant interval change in cardiomegaly, vascular congestion and moderate pulmonary edema. Small bilateral pleural effusions as before. Slight worsening of basilar airspace disease. Aortic atherosclerosis. No pneumothorax. IMPRESSION: 1. Continued cardiomegaly with vascular congestion, moderate pulmonary edema and small pleural effusions. 2. Worsening aeration of the lung bases, may reflect atelectasis or superimposed pneumonia. Electronically Signed   By: Donavan Foil M.D.   On: 08/21/2018 16:29   Dg Chest Port 1 View  Result Date: 08/19/2018 CLINICAL DATA:  Shortness of breath EXAM: PORTABLE CHEST 1 VIEW COMPARISON:  05/03/2017 FINDINGS: Cardiomegaly with vascular congestion. Bilateral interstitial and airspace opacities concerning for edema. Small bilateral pleural effusions and bibasilar atelectasis. IMPRESSION: Cardiomegaly, mild pulmonary edema. Small effusions with bibasilar atelectasis. Electronically Signed   By: Rolm Baptise M.D.   On: 08/19/2018 22:46    Orson Eva, DO  [facility] Pager [phone]  If 7PM-7AM, please contact night-coverage [email or web address] Password [facility] 08/23/2018, 9:13 AM   LOS: 3 days

## 2018-08-23 NOTE — Progress Notes (Signed)
Order placed for Renal Artery Duplex US by MD. Radiology Dept called to inform staff that that particular [place] cannot be preformed at [facility], and only at [facility]. Appointment made for Wednesday 08/25/18 at 0900. MD made aware of delay. Transportation arranged with [facility].   Celestia Khat, RN

## 2018-08-23 NOTE — Progress Notes (Signed)
Report given to 300 RN at this time.  

## 2018-08-23 NOTE — Care Management Important Message (Signed)
Important Message  Patient Details  Name: Holly Baldwin MRN: [id number] Date of Birth: [birth year]   Medicare Important Message Given:  Yes     Tommy Medal 08/23/2018, 3:04 PM

## 2018-08-23 NOTE — Progress Notes (Signed)
Xcover Per RN bp uncontrolled Added clonidine 0.1mg  po tid earlier. Which apparently helped some but now bp high again  A/P Hypertension uncontrolled Clonidine 0.1mg  po x1 If bp not improving can restart nitro gtt

## 2018-08-24 LAB — IRON AND TIBC
Iron: 33 ug/dL (ref 28–170)
Saturation Ratios: 16 % (ref 10.4–31.8)
TIBC: 206 ug/dL — ABNORMAL LOW (ref 250–450)
UIBC: 173 ug/dL

## 2018-08-24 LAB — GLUCOSE, CAPILLARY
Glucose-Capillary: 89 mg/dL (ref 70–99)
Glucose-Capillary: 229 mg/dL — ABNORMAL HIGH (ref 70–99)
Glucose-Capillary: 126 mg/dL — ABNORMAL HIGH (ref 70–99)
Glucose-Capillary: 77 mg/dL (ref 70–99)

## 2018-08-24 LAB — CBC
HCT: 34.5 % — ABNORMAL LOW (ref 36.0–46.0)
Hemoglobin: 10.5 g/dL — ABNORMAL LOW (ref 12.0–15.0)
MCH: 25.9 pg — ABNORMAL LOW (ref 26.0–34.0)
MCHC: 30.4 g/dL (ref 30.0–36.0)
MCV: 85.2 fL (ref 80.0–100.0)
Platelets: 317 10*3/uL (ref 150–400)
RBC: 4.05 MIL/uL (ref 3.87–5.11)
RDW: 15.7 % — ABNORMAL HIGH (ref 11.5–15.5)
WBC: 8.6 10*3/uL (ref 4.0–10.5)
nRBC: 0 % (ref 0.0–0.2)

## 2018-08-24 LAB — BASIC METABOLIC PANEL
Anion gap: 7 (ref 5–15)
BUN: 32 mg/dL — ABNORMAL HIGH (ref 8–23)
CO2: 28 mmol/L (ref 22–32)
Calcium: 8.2 mg/dL — ABNORMAL LOW (ref 8.9–10.3)
Chloride: 106 mmol/L (ref 98–111)
Creatinine, Ser: 2.25 mg/dL — ABNORMAL HIGH (ref 0.44–1.00)
GFR calc Af Amer: 24 mL/min — ABNORMAL LOW (ref >60)
GFR calc non Af Amer: 21 mL/min — ABNORMAL LOW (ref >60)
Glucose, Bld: 112 mg/dL — ABNORMAL HIGH (ref 70–99)
Potassium: 3.1 mmol/L — ABNORMAL LOW (ref 3.5–5.1)
Sodium: 141 mmol/L (ref 135–145)

## 2018-08-24 LAB — FERRITIN: Ferritin: 153 ng/mL (ref 11–307)

## 2018-08-24 MED ORDER — SPIRONOLACTONE 25 MG PO TABS
50.0000 mg | ORAL_TABLET | Freq: Every day | ORAL | Status: DC
  Administered 2018-08-24 – 2018-08-25 (×2): 50 mg via ORAL
  Filled 2018-08-24 (×2): qty 2

## 2018-08-24 MED ORDER — POTASSIUM CHLORIDE CRYS ER 20 MEQ PO TBCR
40.0000 meq | EXTENDED_RELEASE_TABLET | Freq: Once | ORAL | Status: AC
  Administered 2018-08-24: 40 meq via ORAL
  Filled 2018-08-24: qty 2

## 2018-08-24 MED ORDER — DOXAZOSIN MESYLATE 2 MG PO TABS
4.0000 mg | ORAL_TABLET | Freq: Every day | ORAL | Status: DC
  Administered 2018-08-24 – 2018-08-25 (×2): 4 mg via ORAL
  Filled 2018-08-24 (×2): qty 2

## 2018-08-24 MED ORDER — FUROSEMIDE 40 MG PO TABS
40.0000 mg | ORAL_TABLET | Freq: Every day | ORAL | Status: DC
  Administered 2018-08-24 – 2018-08-25 (×2): 40 mg via ORAL
  Filled 2018-08-24 (×2): qty 1

## 2018-08-24 NOTE — Evaluation (Addendum)
Physical Therapy Evaluation Patient Details Name: Holly Baldwin MRN: [id number] DOB: 06/12/1945 Today's Date: 08/24/2018   History of Present Illness  Holly Baldwin  is a 73 y.o. female, w hypertension, hyperlipidemia, Dm2, Gerd, Anxiety, apparently c/o dyspnea x1 week. Slight dry cough.  May have slight lower ext edema. Pt doesn't think that she gained weight.  Pt denies fever, chills, cp, palp, n/v, diarrhea, brbpr. Pt notes has had some RUQ discomfort recently.   Pt notes that she has been sick when taking cardizem CD 360mg  po qday.  While reviewing her medications bottles, she told me that she is also taking the cardizem CD 240mg  po qday as well for a total of 600mg  of cardizem total per day.   Pt seen by PCP recently ,, this past Monday and was given a proventil inhaler without improvement in her dyspnea.    Clinical Impression  Patient functioning at baseline for functional mobility and gait, on room air during gait training with SpO2 dropping from above 90% down to 85%, after sitting up in chair patient's SpO2 increased back up to 97% while on room air - nurse notified.  Plan:  Patient discharged from physical therapy to care of nursing for ambulation daily as tolerated for length of stay.     Follow Up Recommendations No PT follow up    Equipment Recommendations  None recommended by PT    Recommendations for Other Services       Precautions / Restrictions Precautions Precautions: None Restrictions Weight Bearing Restrictions: No      Mobility  Bed Mobility Overal bed mobility: Independent                Transfers Overall transfer level: Independent                  Ambulation/Gait Ambulation/Gait assistance: Modified independent (Device/Increase time) Gait Distance (Feet): 150 Feet Assistive device: None Gait Pattern/deviations: WFL(Within Functional Limits) Gait velocity: normal   General Gait Details: demonstrates good return for ambulation in room and  hallways without loss of balance, on room air with SpO2 dropping to 85%  Stairs            Wheelchair Mobility    Modified Rankin (Stroke Patients Only)       Balance Overall balance assessment: No apparent balance deficits (not formally assessed)                                           Pertinent Vitals/Pain Pain Assessment: No/denies pain    Home Living Family/patient expects to be discharged to:: Private residence   Available Help at Discharge: Family Type of Home: House Home Access: Level entry     Home Layout: One level Home Equipment: None      Prior Function Level of Independence: Independent         Comments: community ambulator, drives     Journalist, newspaper        Extremity/Trunk Assessment   Upper Extremity Assessment Upper Extremity Assessment: Overall WFL for tasks assessed    Lower Extremity Assessment Lower Extremity Assessment: Overall WFL for tasks assessed    Cervical / Trunk Assessment Cervical / Trunk Assessment: Normal  Communication   Communication: No difficulties  Cognition Arousal/Alertness: Awake/alert Behavior During Therapy: WFL for tasks assessed/performed Overall Cognitive Status: Within Functional Limits for tasks assessed  General Comments      Exercises     Assessment/Plan    PT Assessment Patent does not need any further PT services  PT Problem List         PT Treatment Interventions      PT Goals (Current goals can be found in the Care Plan section)  Acute Rehab PT Goals Patient Stated Goal: return home PT Goal Formulation: With patient Time For Goal Achievement: 08/24/18 Potential to Achieve Goals: Good    Frequency     Barriers to discharge        Co-evaluation               AM-PAC PT "6 Clicks" Mobility  Outcome Measure Help needed turning from your back to your side while in a flat bed without using  bedrails?: None Help needed moving from lying on your back to sitting on the side of a flat bed without using bedrails?: None Help needed moving to and from a bed to a chair (including a wheelchair)?: None Help needed standing up from a chair using your arms (e.g., wheelchair or bedside chair)?: None Help needed to walk in hospital room?: None Help needed climbing 3-5 steps with a railing? : None 6 Click Score: 24    End of Session  Activity Tolerance: Patient tolerated treatment well Patient left: in chair;with call bell/phone within reach Nurse Communication: Mobility status PT Visit Diagnosis: Unsteadiness on feet (R26.81);Other abnormalities of gait and mobility (R26.89);Muscle weakness (generalized) (M62.81)    Time: 5183-4373 PT Time Calculation (min) (ACUTE ONLY): 20 min   Charges:   PT Evaluation $PT Eval Moderate Complexity: 1 Mod PT Treatments $Gait Training: 8-22 mins        1:35 PM, 08/24/18 Lonell Grandchild, MPT Physical Therapist with [facility] [phone] office [phone] mobile phone

## 2018-08-24 NOTE — Telephone Encounter (Signed)
Holly Baldwin is aware of the recommendations

## 2018-08-24 NOTE — Progress Notes (Addendum)
PROGRESS NOTE  Holly Baldwin QIO:[id number] DOB: April 20, 1945 DOA: 08/19/2018 PCP: Sharilyn Sites, MD  Brief History: 73 year old female with a history of hypertension, hyperlipidemia, diabetes mellitus, anxiety, GERD presenting with 2-week history of shortness of breath, lower extremity edema, and intermittent chest discomfort. The patient also complained of orthopnea. She denied fevers, chills, coughing, hemoptysis, nausea, vomiting, diarrhea, abdominal pain, headache, neck pain. The patient has poor health literacy, and there has been some confusion regarding her home medications.When reviewing her medication bottles, the patient confirmed that she was taking both diltiazem 240 mg CD and diltiazem 360 mg CD. In addition, the patient has been taking Geodon 60 mg at bedtime along with a second bottle of Geodon 80 mg at bedtime. Upon presentation, the patient was noted to be mildly hypoxic. Chest x-ray showed increased interstitial markings and small bilateral effusions. Patient was started on intravenous furosemide. She was noted to bebradycardic in the 30s. Her diltiazem was discontinued with improvement of HR. The patient was initially on nitroglycerin drip for her malignant hypertension.  Oral anti-hypertensive meds have been gradually titrated to improve blood pressure.  Assessment/Plan: AcutediastolicCHF -Echocardiogram--EF 60-65%, severe LAE, RAE,+diastolic dysfunction -accurate I/Os--incomplete ?NEG 7L -NEG 6.6 kg -transition to po lasix 6/23  Acute Respiratory Failure with hypoxia -due to pulmonary edema -stable on 3L>>>1L>>RA -wean to RA for saturation >92%  Malignant HTN -Patient initially placed on nitroglycerin drip -Discontinuednitroglycerin drip -Continue hydralazine, amlodipine, imdur -Holding clonidine as this can exacerbate bradycardia -increase spironolactone to 50 mg -continue coreg 12.5 mg bid -add doxazosin -check renin/aldosterone  ratio--pending -renal artery duplex--scheduled for 6/24 at 9AM at [facility] -24 hour urine for metanephrines--penidng  Acute on chronic renal failure--CKD stage III -Baseline creatinine 1.7-2.0 -Suspect the patient may have progression of underlying kidney disease -will have to tolerate worse renal function for improved respiratory status -Monitor with diuresis -A.m. BMP  Diabetes mellitus type 2 -Check hemoglobin A1c--pending -Holding metformin -NovoLog sliding scale -Increase Lantus to 40 units -increase novolog 8 units with meals  Hyperlipidemia -Continue statin  Depression/anxiety -Continue Geodon -Restart fluoxetine  Hypothyroidism -Continue Synthroid  Hypokalemia -repleted -check mag--2.2  Constipation -bisacodyl supp and miralax     Disposition Plan: Home 6/24 if stable after renal artery duplex Family Communication: Family at bedside  Consultants:Cardiology  Code Status: FULL   DVT Prophylaxis: [place] Lovenox   Procedures: As Listed in Progress Note Above  Antibiotics: None     Subjective: Patient denies fevers, chills, headache, chest pain, dyspnea, nausea, vomiting, diarrhea, abdominal pain, dysuria, hematuria, hematochezia, and melena.   Objective: Vitals:   08/24/18 0500 08/24/18 0508 08/24/18 0821 08/24/18 1332  BP:  (!) 183/65  130/63  Pulse:  68  61  Resp:  16  19  Temp:  98.8 F (37.1 C)  98.7 F (37.1 C)  TempSrc:  Oral  Oral  SpO2:  96% 98% 95%  Weight: 101.2 kg     Height:        Intake/Output Summary (Last 24 hours) at 08/24/2018 1732 Last data filed at 08/24/2018 1300 Gross per 24 hour  Intake 720 ml  Output 702 ml  Net 18 ml   Weight change: -3 kg Exam:   General:  Pt is alert, follows commands appropriately, not in acute distress  HEENT: No icterus, No thrush, No neck mass, [place]/AT  Cardiovascular: RRR, S1/S2, no rubs, no gallops  Respiratory: CTA bilaterally, no wheezing, no crackles, no  rhonchi  Abdomen: Soft/+BS, non  tender, non distended, no guarding  Extremities: No edema, No lymphangitis, No petechiae, No rashes, no synovitis   Data Reviewed: I have personally reviewed following labs and imaging studies Basic Metabolic Panel: Recent Labs  Lab 08/20/18 0435 08/21/18 0449 08/22/18 0356 08/23/18 0422 08/24/18 0621  NA 139 134* 136 139 141  K 3.5 4.0 3.7 3.3* 3.1*  CL 103 100 102 103 106  CO2 24 24 26 26 28   GLUCOSE 365* 254* 228* 138* 112*  BUN 29* 36* 34* 30* 32*  CREATININE 2.36* 2.43* 2.41* 2.05* 2.25*  CALCIUM 8.3* 8.5* 8.4* 8.3* 8.2*  MG  --  2.8*  --  2.2  --    Liver Function Tests: Recent Labs  Lab 08/19/18 2212 08/20/18 0435  AST 38 26  ALT 59* 47*  ALKPHOS 155* 134*  BILITOT 0.2* 0.5  PROT 6.4* 5.4*  ALBUMIN 3.1* 2.7*   Recent Labs  Lab 08/19/18 2212  LIPASE 24   No results for input(s): AMMONIA in the last 168 hours. Coagulation Profile: No results for input(s): INR, PROTIME in the last 168 hours. CBC: Recent Labs  Lab 08/19/18 2212 08/20/18 0435 08/22/18 0356 08/23/18 0422 08/24/18 0621  WBC 12.5* 9.6 14.5* 11.6* 8.6  NEUTROABS 9.7*  --   --   --   --   HGB 10.7* 9.2* 9.9* 10.6* 10.5*  HCT 34.8* 30.6* 33.3* 35.4* 34.5*  MCV 86.1 87.7 87.9 87.2 85.2  PLT 276 239 275 303 317   Cardiac Enzymes: Recent Labs  Lab 08/19/18 2212 08/20/18 0435 08/20/18 1115 08/20/18 1648  TROPONINI 0.03* 0.06* 0.05* 0.04*   BNP: Invalid input(s): POCBNP CBG: Recent Labs  Lab 08/23/18 1707 08/23/18 2101 08/24/18 0735 08/24/18 1106 08/24/18 1613  GLUCAP 124* 117* 89 229* 126*   HbA1C: No results for input(s): HGBA1C in the last 72 hours. Urine analysis:    Component Value Date/Time   COLORURINE STRAW (A) 08/22/2018 1036   APPEARANCEUR CLEAR 08/22/2018 1036   LABSPEC 1.007 08/22/2018 1036   PHURINE 7.0 08/22/2018 1036   GLUCOSEU 50 (A) 08/22/2018 1036   HGBUR NEGATIVE 08/22/2018 1036   BILIRUBINUR NEGATIVE 08/22/2018  1036   KETONESUR NEGATIVE 08/22/2018 1036   PROTEINUR >=300 (A) 08/22/2018 1036   UROBILINOGEN 1.0 10/10/2014 1120   NITRITE NEGATIVE 08/22/2018 1036   LEUKOCYTESUR SMALL (A) 08/22/2018 1036   Sepsis Labs: @LABRCNTIP (procalcitonin:4,lacticidven:4) ) Recent Results (from the past 240 hour(s))  SARS Coronavirus 2 (CEPHEID- Performed in [place] hospital lab), Hosp Order     Status: None   Collection Time: 08/19/18 10:00 PM   Specimen: Nasopharyngeal Swab  Result Value Ref Range Status   SARS Coronavirus 2 NEGATIVE NEGATIVE Final    Comment: (NOTE) If result is NEGATIVE SARS-CoV-2 target nucleic acids are NOT DETECTED. The SARS-CoV-2 RNA is generally detectable in upper and lower  respiratory specimens during the acute phase of infection. The lowest  concentration of SARS-CoV-2 viral copies this assay can detect is 250  copies / mL. A negative result does not preclude SARS-CoV-2 infection  and should not be used as the sole basis for treatment or other  patient management decisions.  A negative result may occur with  improper specimen collection / handling, submission of specimen other  than nasopharyngeal swab, presence of viral mutation(s) within the  areas targeted by this assay, and inadequate number of viral copies  (<250 copies / mL). A negative result must be combined with clinical  observations, patient history, and epidemiological information. If result  is POSITIVE SARS-CoV-2 target nucleic acids are DETECTED. The SARS-CoV-2 RNA is generally detectable in upper and lower  respiratory specimens dur ing the acute phase of infection.  Positive  results are indicative of active infection with SARS-CoV-2.  Clinical  correlation with patient history and other diagnostic information is  necessary to determine patient infection status.  Positive results do  not rule out bacterial infection or co-infection with other viruses. If result is PRESUMPTIVE POSTIVE SARS-CoV-2 nucleic  acids MAY BE PRESENT.   A presumptive positive result was obtained on the submitted specimen  and confirmed on repeat testing.  While 2019 novel coronavirus  (SARS-CoV-2) nucleic acids may be present in the submitted sample  additional confirmatory testing may be necessary for epidemiological  and / or clinical management purposes  to differentiate between  SARS-CoV-2 and other Sarbecovirus currently known to infect humans.  If clinically indicated additional testing with an alternate test  methodology [phone]) is advised. The SARS-CoV-2 RNA is generally  detectable in upper and lower respiratory sp ecimens during the acute  phase of infection. The expected result is Negative. Fact Sheet for Patients:  StrictlyIdeas.no Fact Sheet for Healthcare Providers: BankingDealers.co.za This test is not yet approved or cleared by the [place] FDA and has been authorized for detection and/or diagnosis of SARS-CoV-2 by FDA under an Emergency Use Authorization (EUA).  This EUA will remain in effect (meaning this test can be used) for the duration of the COVID-19 declaration under Section 564(b)(1) of the Act, 21 U.S.C. section 360bbb-3(b)(1), unless the authorization is terminated or revoked sooner. Performed at [facility], [address], [place], [place] [address]   MRSA PCR Screening     Status: None   Collection Time: 08/20/18  1:27 AM   Specimen: Nasal Mucosa; Nasopharyngeal  Result Value Ref Range Status   MRSA by PCR NEGATIVE NEGATIVE Final    Comment:        The GeneXpert MRSA Assay (FDA approved for NASAL specimens only), is one component of a comprehensive MRSA colonization surveillance program. It is not intended to diagnose MRSA infection nor to guide or monitor treatment for MRSA infections. Performed at [facility], [address], [place], [place] [address]   Urine culture     Status: Abnormal   Collection Time: 08/22/18  10:36 AM   Specimen: Urine, Clean Catch  Result Value Ref Range Status   Specimen Description   Final    URINE, CLEAN CATCH Performed at [facility], [address], [place], [place] [address]    Special Requests   Final    NONE Performed at [facility], [address], [place], [place] [address]    Culture MULTIPLE SPECIES PRESENT, SUGGEST RECOLLECTION (A)  Final   Report Status 08/23/2018 FINAL  Final     Scheduled Meds: . amLODipine  10 mg Oral Daily  . aspirin EC  81 mg Oral QHS  . carvedilol  12.5 mg Oral BID WC  . doxazosin  4 mg Oral Daily  . enoxaparin (LOVENOX) injection  30 mg Subcutaneous Q24H  . FLUoxetine  20 mg Oral Daily  . furosemide  40 mg Oral Daily  . gabapentin  100 mg Oral BID  . hydrALAZINE  100 mg Oral TID  . insulin aspart  0-20 Units Subcutaneous TID WC  . insulin aspart  0-5 Units Subcutaneous QHS  . insulin aspart  8 Units Subcutaneous TID WC  . insulin glargine  40 Units Subcutaneous Q2200  . ipratropium-albuterol  3 mL Nebulization BID  . isosorbide mononitrate  30 mg Oral Daily  . levothyroxine  137 mcg Oral QAC breakfast  . mometasone-formoterol  2 puff Inhalation BID  . polyethylene glycol  17 g Oral Daily  . simvastatin  20 mg Oral Daily  . spironolactone  50 mg Oral Daily  . topiramate  100 mg Oral BID  . ziprasidone  80 mg Oral QHS   Continuous Infusions: . sodium chloride Stopped (08/20/18 0111)  . nitroGLYCERIN Stopped (08/23/18 0511)    Procedures/Studies: US Abdomen Complete  Result Date: 08/20/2018 CLINICAL DATA:  Abnormal LFTs; history hypertension, GERD, diabetes mellitus, hyperlipidemia EXAM: ABDOMEN ULTRASOUND COMPLETE COMPARISON:  CT abdomen and pelvis 06/02/2012 FINDINGS: Gallbladder: Surgically absent Common bile duct: Diameter: 5 mm diameter, normal Liver: Echogenic parenchyma, likely fatty infiltration though this can be seen with cirrhosis and certain infiltrative disorders. No focal hepatic mass or nodularity. Portal  vein is patent on color Doppler imaging with normal direction of blood flow towards the liver. IVC: Normal appearance Pancreas: Pancreatic head through proximal tail normal appearance, distal tail obscured by bowel gas Spleen: Normal appearance, 5.5 cm length Right Kidney: Length: 10.3 cm. Normal cortical thickness. Mildly increased cortical echogenicity. No mass or hydronephrosis. Left Kidney: Length: 10.3 cm. Normal cortical thickness. Diffusely increased cortical echogenicity. No mass, hydronephrosis or shadowing calcification. Abdominal aorta: Suboptimally visualized due to bowel gas, visualized proximal portion normal caliber Other findings: No free fluid. Small BILATERAL pleural effusions noted. IMPRESSION: Post cholecystectomy with probable fatty infiltration of liver as above. Incomplete visualization of pancreatic tail and aorta. Medical renal disease changes of both kidneys. Small BILATERAL pleural effusions. Electronically Signed   By: Lavonia Dana M.D.   On: 08/20/2018 15:18   Dg Chest Port 1 View  Result Date: 08/21/2018 CLINICAL DATA:  Hypertension diabetes short of breath EXAM: PORTABLE CHEST 1 VIEW COMPARISON:  08/19/2018, 05/03/2017 FINDINGS: No significant interval change in cardiomegaly, vascular congestion and moderate pulmonary edema. Small bilateral pleural effusions as before. Slight worsening of basilar airspace disease. Aortic atherosclerosis. No pneumothorax. IMPRESSION: 1. Continued cardiomegaly with vascular congestion, moderate pulmonary edema and small pleural effusions. 2. Worsening aeration of the lung bases, may reflect atelectasis or superimposed pneumonia. Electronically Signed   By: Donavan Foil M.D.   On: 08/21/2018 16:29   Dg Chest Port 1 View  Result Date: 08/19/2018 CLINICAL DATA:  Shortness of breath EXAM: PORTABLE CHEST 1 VIEW COMPARISON:  05/03/2017 FINDINGS: Cardiomegaly with vascular congestion. Bilateral interstitial and airspace opacities concerning for edema.  Small bilateral pleural effusions and bibasilar atelectasis. IMPRESSION: Cardiomegaly, mild pulmonary edema. Small effusions with bibasilar atelectasis. Electronically Signed   By: Rolm Baptise M.D.   On: 08/19/2018 22:46    Orson Eva, DO  [facility] Pager [phone]  If 7PM-7AM, please contact night-coverage [email or web address] Password [facility] 08/24/2018, 5:32 PM   LOS: 4 days

## 2018-08-24 NOTE — Progress Notes (Signed)
RN paged provider to report CBG of 77 and patient is NPO after midnight, inquiring if Lantus can be held, awaiting response.  P.J. Linus Mako, RN

## 2018-08-24 NOTE — TOC Transition Note (Signed)
Transition of Care [facility]) - CM/SW Discharge Note   Patient Details  Name: Holly Baldwin MRN: [id number] Date of Birth: 04/30/1945  [facility] of [facility]) CM/SW Contact:  Ihor Gully, LCSW Phone Number: 08/24/2018, 10:13 AM   Clinical Narrative:    Patient referred to [facility] for teaching and education of multiple chronic health diagnoses.   Final next level of care: [place] Barriers to Discharge: No Barriers Identified   Patient Goals and CMS Choice Patient states their goals for this hospitalization and ongoing recovery are:: To return home and feel better CMS Medicare.gov Compare Post Acute Care list provided to:: Other (Comment Required)(spouse) Choice offered to / list presented to : Spouse  Discharge Placement                       Discharge Plan and Services   Discharge Planning Services: CM Consult Post Acute Care Choice: Home Health                    HH Arranged: RN [place]: [place], [place] ([place]) Date [place]: 08/20/18 Time [place]: 1442 Representative spoke with at [place]: [place] ([place]) Interventions     Readmission Risk Interventions Readmission Risk Prevention Plan 08/20/2018  Transportation Screening Complete  PCP or Specialist Appt within 3-5 Days Complete  HRI or [place] Complete  Social Work Consult for [place] Planning/Counseling Complete  Palliative Care Screening Not Applicable  Medication Review Press photographer) Complete  Some recent data might be hidden

## 2018-08-25 ENCOUNTER — Other Ambulatory Visit (HOSPITAL_COMMUNITY): Payer: Self-pay | Admitting: Radiology

## 2018-08-25 ENCOUNTER — Other Ambulatory Visit (HOSPITAL_COMMUNITY): Payer: Self-pay | Admitting: Internal Medicine

## 2018-08-25 ENCOUNTER — Inpatient Hospital Stay (HOSPITAL_COMMUNITY)
Admit: 2018-08-25 | Discharge: 2018-08-25 | Disposition: A | Discharge status: Home / Self Care | Payer: Medicare HMO | Attending: Internal Medicine | Admitting: Internal Medicine

## 2018-08-25 DIAGNOSIS — I10 Essential (primary) hypertension: Secondary | ICD-10-CM

## 2018-08-25 DIAGNOSIS — I5033 Acute on chronic diastolic (congestive) heart failure: Secondary | ICD-10-CM

## 2018-08-25 LAB — BASIC METABOLIC PANEL
Anion gap: 11 (ref 5–15)
BUN: 36 mg/dL — ABNORMAL HIGH (ref 8–23)
CO2: 28 mmol/L (ref 22–32)
Calcium: 7.9 mg/dL — ABNORMAL LOW (ref 8.9–10.3)
Chloride: 103 mmol/L (ref 98–111)
Creatinine, Ser: 2.54 mg/dL — ABNORMAL HIGH (ref 0.44–1.00)
GFR calc Af Amer: 21 mL/min — ABNORMAL LOW (ref >60)
GFR calc non Af Amer: 18 mL/min — ABNORMAL LOW (ref >60)
Glucose, Bld: 138 mg/dL — ABNORMAL HIGH (ref 70–99)
Potassium: 3.9 mmol/L (ref 3.5–5.1)
Sodium: 142 mmol/L (ref 135–145)

## 2018-08-25 LAB — MAGNESIUM: Magnesium: 2 mg/dL (ref 1.7–2.4)

## 2018-08-25 LAB — GLUCOSE, CAPILLARY
Glucose-Capillary: 132 mg/dL — ABNORMAL HIGH (ref 70–99)
Glucose-Capillary: 213 mg/dL — ABNORMAL HIGH (ref 70–99)

## 2018-08-25 MED ORDER — FUROSEMIDE 40 MG PO TABS: 40.0000 mg | ORAL_TABLET | Freq: Every day | ORAL | 3 refills | Status: DC

## 2018-08-25 MED ORDER — GLIPIZIDE 5 MG PO TABS: 5.0000 mg | ORAL_TABLET | Freq: Every day | ORAL | 3 refills | Status: DC

## 2018-08-25 MED ORDER — CARVEDILOL 25 MG PO TABS: 25.0000 mg | ORAL_TABLET | Freq: Two times a day (BID) | ORAL | 3 refills | Status: DC

## 2018-08-25 MED ORDER — AMLODIPINE BESYLATE 10 MG PO TABS: 10.0000 mg | ORAL_TABLET | Freq: Every day | ORAL | 3 refills | Status: DC

## 2018-08-25 MED ORDER — ISOSORBIDE MONONITRATE ER 60 MG PO TB24: 60.0000 mg | ORAL_TABLET | Freq: Every day | ORAL | 3 refills | Status: DC

## 2018-08-25 MED ORDER — HYDRALAZINE HCL 100 MG PO TABS: 100.0000 mg | ORAL_TABLET | Freq: Three times a day (TID) | ORAL | 3 refills | Status: DC

## 2018-08-25 NOTE — Discharge Summary (Signed)
Physician Discharge Summary  Holly Baldwin XTK:[id number] DOB: 02-Dec-1945 DOA: 08/19/2018  PCP: Sharilyn Sites, MD  Admit date: 08/19/2018 Discharge date: 08/25/2018  Time spent: 35 minutes  Recommendations for Outpatient Follow-up:  1. Repeat CBC to follow hemoglobin trend 2. Repeat basic metabolic panel to follow electrolytes and renal function 3. Reassess blood pressure and further adjust antihypertensive regimen as needed 4. Close follow-up the patient CBGs/A1c with further adjustment to hypoglycemic regimen as needed.   Discharge Diagnoses:    Acute on chronic congestive heart failure.   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  Patient discharged home with instruction to follow-up with PCP in 1 week.  Diet recommendation: Heart healthy modified carbohydrate diet.  Filed Weights   08/23/18 0624 08/24/18 0500 08/25/18 0500  Weight: 104.2 kg 101.2 kg 99.8 kg    History of present illness:  As per H&P written by Dr. Maudie Mercury on 08/19/2018 73 year old female with a history of hypertension, hyperlipidemia, diabetes mellitus, anxiety, GERD presenting with 2-week history of shortness of breath, lower extremity edema, and intermittent chest discomfort. The patient also complained of orthopnea. She denied fevers, chills, coughing, hemoptysis, nausea, vomiting, diarrhea, abdominal pain, headache, neck pain. The patient has poor health literacy, and there has been some confusion regarding her home medications.When reviewing her medication bottles, the patient confirmed that she was taking both diltiazem 240 mg CD and diltiazem 360 mg CD. In addition,  the patient has been taking Geodon 60 mg at bedtime along with a second bottle of Geodon 80 mg at bedtime. Upon presentation, the patient was noted to be mildly hypoxic. Chest x-ray showed increased interstitial markings and small bilateral effusions. Patient was started on intravenous furosemide. She was noted to bebradycardic in the 30s. Her diltiazem was discontinued with improvement of HR. The patient was initially on nitroglycerin drip for her malignant hypertension.  Hospital Course:  1-acute on chronic diastolic heart failure -2D echo demonstrating diastolic dysfunction -Preserved ejection fraction appreciated -Patient negative close to 10 L since admission. -No requiring oxygen supplementation and with controlled blood pressure. -Discharged home with instruction to follow low-sodium diet, to check her weight on daily basis and to follow-up with PCP in 1 week.  2-acute respiratory failure with hypoxia -In the setting of diastolic heart failure with interstitial edema -Improved and stabilized; at discharge no requiring oxygen supplementation. -Continue blood pressure control and Lasix as instructed.  3-malignant hypertension -Patient initially placed on nitroglycerin drip; nitroglycerin discontinue with adjustment in her medication using hydralazine, amlodipine, Inderal, Lasix and carvedilol provided with good control of blood pressure at time of discharge. -Work-up to rule out renal artery stenosis done on 08/25/2018 demonstrating no renal artery stenosis on renal artery duplex ultrasound. -Patient advised to follow low-sodium diet and to be compliant with medications -Close follow-up the patient vital signs with further adjustment in 2 antihypertensive regimen recommended.  4-urine chronic renal failure: Patient with chronic kidney disease is stage III secondary to hypertension and diabetes. -Baseline creatinine 1.7-2.0 -Peak at 2.5; improving and trending down at time of  discharge 2.2 -Patient advised to maintain adequate hydration -Repeat basic metabolic panel to follow electrolytes and renal function.  5-Type 2  diabetes mellitus with nephropathy -Metformin has been discontinue in the setting of renal dysfunction -Will continue adjusted dose of Lantus and the use of glipizide -Modified carbohydrate diet has been instructed. -Follow-up the patient A1c and CBGs recommended with further adjustment to hypoglycemic regimen as needed.  6-hypothyroidism  -continue Synthroid.  7-hyperlipidemia  -continue statins  8-depression and/anxiety -Continue Geodon and fluoxetine -Stable mood and no signs of suicidal ideation or hallucinations currently.  9-hypokalemia  -repleted and within normal limits at discharge -Magnesium 2.2 -Repeat basic metabolic panel follow-up visit.  10-morbid obesity -Class II based on BMI -Body mass index is 37.77 kg/m. -Low calorie diet, portion control and increase physical activity has been recommended.  11-anemia of chronic kidney disease -No signs of overt bleeding -Hemoglobin stable -Repeat CBC at follow-up visit to assess blood count and stability.  Procedures:  2-D echo: Demonstrated an ejection fraction of 60 to 65%, severe left atrial enlargement, right atrial enlargement and positive diastolic dysfunction. No wall motion disease.   Consultations:  None   Discharge Exam: Vitals:   08/25/18 1503 08/25/18 1521  BP: (!) 166/58 (!) 169/54  Pulse: (!) 51   Resp: 19   Temp: 98.7 F (37.1 C)   SpO2: 97%     General: No chest pain, no nausea, no vomiting, no shortness of breath. Cardiovascular: S1 and S2, no rubs, no gallops, no murmurs. Respiratory: Good air movement bilaterally, no wheezing, no crackles, no rhonchi.  Normal respiratory effort. Abdomen: Soft, nontender, nondistended, positive bowel sounds, no guarding. Extremities: No cyanosis, no clubbing; no joint deformity appreciated on exam; positive  trace edema bilateral.  Discharge Instructions   Discharge Instructions    Diet - low sodium heart healthy   Complete by: As directed    Diet Carb Modified   Complete by: As directed    Discharge instructions   Complete by: As directed    Take medications as prescribed Arrange follow-up with PCP in 10 days Follow heart healthy diet (less than 2 g of sodium daily) Maintain adequate hydration.     Allergies as of 08/25/2018      Reactions   Bee Venom Anaphylaxis   Ace Inhibitors    Penicillins Hives   .Has patient had a PCN reaction causing immediate rash, facial/tongue/throat swelling, SOB or lightheadedness with hypotension: Yes Has patient had a PCN reaction causing severe rash involving mucus membranes or skin necrosis: No Has patient had a PCN reaction that required hospitalization: Yes Has patient had a PCN reaction occurring within the last 10 years: No If all of the above answers are "NO", then may proceed with Cephalosporin use.      Medication List    STOP taking these medications   amLODipine-olmesartan 10-40 MG tablet Commonly known as: AZOR   cloNIDine 0.2 MG tablet Commonly known as: CATAPRES   Dilt-XR 240 MG 24 hr capsule Generic drug: diltiazem   metFORMIN 500 MG tablet Commonly known as: GLUCOPHAGE   zolpidem 5 MG tablet Commonly known as: AMBIEN     TAKE these medications   albuterol 108 (90 Base) MCG/ACT inhaler Commonly known as: VENTOLIN HFA Inhale into the lungs every 6 (six) hours as needed for wheezing or shortness of breath.   amLODipine 10 MG tablet Commonly known as: NORVASC Take 1 tablet (10 mg total) by mouth daily. Start taking on: August 26, 2018   aspirin EC 81 MG tablet Take 81 mg by mouth at bedtime.   budesonide-formoterol 160-4.5 MCG/ACT inhaler Commonly known as: SYMBICORT  Inhale 2 puffs into the lungs 2 (two) times daily.   capsaicin 0.025 % cream Commonly known as: ZOSTRIX Apply topically 2 (two) times daily.    carvedilol 25 MG tablet Commonly known as: COREG Take 1 tablet (25 mg total) by mouth 2 (two) times daily with a meal.   cycloSPORINE 0.05 % ophthalmic emulsion Commonly known as: RESTASIS Place 1 drop into both eyes daily.   donepezil 5 MG tablet Commonly known as: ARICEPT Take 5 mg by mouth at bedtime.   EPINEPHrine 0.3 mg/0.3 mL Devi Commonly known as: EPI-PEN Inject 0.3 mg into the muscle once.   FLUoxetine 20 MG capsule Commonly known as: PROZAC Take 20 mg by mouth every morning.   furosemide 40 MG tablet Commonly known as: LASIX Take 1 tablet (40 mg total) by mouth daily. Start taking on: August 26, 2018   gabapentin 100 MG capsule Commonly known as: NEURONTIN TAKE 1 CAPSULE TWICE DAILY   glipiZIDE 5 MG tablet Commonly known as: Glucotrol Take 1 tablet (5 mg total) by mouth daily before breakfast.   hydrALAZINE 100 MG tablet Commonly known as: APRESOLINE Take 1 tablet (100 mg total) by mouth 3 (three) times daily.   isosorbide mononitrate 60 MG 24 hr tablet Commonly known as: IMDUR Take 1 tablet (60 mg total) by mouth daily. Start taking on: August 26, 2018   Lantus SoloStar 100 UNIT/ML Solostar Pen Generic drug: Insulin Glargine Inject 50 Units into the skin at bedtime. INJECT  50 UNITS SUBCUTANEOUSLY  AT  10PM   levothyroxine 137 MCG tablet Commonly known as: SYNTHROID Take 1 tablet (137 mcg total) by mouth daily before breakfast.   simvastatin 40 MG tablet Commonly known as: ZOCOR TAKE 1 TABLET EVERY DAY   topiramate 100 MG tablet Commonly known as: TOPAMAX Take 1 tablet by mouth 2 (two) times daily.   Vitamin D3 125 MCG (5000 UT) Caps TAKE 1 CAPSULE EVERY DAY   ziprasidone 80 MG capsule Commonly known as: GEODON Take 80 mg by mouth at bedtime.      Allergies  Allergen Reactions  . Bee Venom Anaphylaxis  . Ace Inhibitors   . Penicillins Hives    .Has patient had a PCN reaction causing immediate rash, facial/tongue/throat swelling, SOB or  lightheadedness with hypotension: Yes Has patient had a PCN reaction causing severe rash involving mucus membranes or skin necrosis: No Has patient had a PCN reaction that required hospitalization: Yes Has patient had a PCN reaction occurring within the last 10 years: No If all of the above answers are "NO", then may proceed with Cephalosporin use.    Follow-up Information    Sharilyn Sites, MD Follow up in 5 day(s).   Specialty: Family Medicine Why: Dr. Gerarda Fraction In office, Tuesday, 08/31/2018 @ 10:15 a.m for appointment at 10:45 a.m. Contact information: [address] [place] [address] [phone]           The results of significant diagnostics from this hospitalization (including imaging, microbiology, ancillary and laboratory) are listed below for reference.    Significant Diagnostic Studies: US Abdomen Complete  Result Date: 08/20/2018 CLINICAL DATA:  Abnormal LFTs; history hypertension, GERD, diabetes mellitus, hyperlipidemia EXAM: ABDOMEN ULTRASOUND COMPLETE COMPARISON:  CT abdomen and pelvis 06/02/2012 FINDINGS: Gallbladder: Surgically absent Common bile duct: Diameter: 5 mm diameter, normal Liver: Echogenic parenchyma, likely fatty infiltration though this can be seen with cirrhosis and certain infiltrative disorders. No focal hepatic mass or nodularity. Portal vein is patent on color Doppler imaging with normal direction  of blood flow towards the liver. IVC: Normal appearance Pancreas: Pancreatic head through proximal tail normal appearance, distal tail obscured by bowel gas Spleen: Normal appearance, 5.5 cm length Right Kidney: Length: 10.3 cm. Normal cortical thickness. Mildly increased cortical echogenicity. No mass or hydronephrosis. Left Kidney: Length: 10.3 cm. Normal cortical thickness. Diffusely increased cortical echogenicity. No mass, hydronephrosis or shadowing calcification. Abdominal aorta: Suboptimally visualized due to bowel gas, visualized proximal portion  normal caliber Other findings: No free fluid. Small BILATERAL pleural effusions noted. IMPRESSION: Post cholecystectomy with probable fatty infiltration of liver as above. Incomplete visualization of pancreatic tail and aorta. Medical renal disease changes of both kidneys. Small BILATERAL pleural effusions. Electronically Signed   By: Lavonia Dana M.D.   On: 08/20/2018 15:18   Dg Chest Port 1 View  Result Date: 08/21/2018 CLINICAL DATA:  Hypertension diabetes short of breath EXAM: PORTABLE CHEST 1 VIEW COMPARISON:  08/19/2018, 05/03/2017 FINDINGS: No significant interval change in cardiomegaly, vascular congestion and moderate pulmonary edema. Small bilateral pleural effusions as before. Slight worsening of basilar airspace disease. Aortic atherosclerosis. No pneumothorax. IMPRESSION: 1. Continued cardiomegaly with vascular congestion, moderate pulmonary edema and small pleural effusions. 2. Worsening aeration of the lung bases, may reflect atelectasis or superimposed pneumonia. Electronically Signed   By: Donavan Foil M.D.   On: 08/21/2018 16:29   Dg Chest Port 1 View  Result Date: 08/19/2018 CLINICAL DATA:  Shortness of breath EXAM: PORTABLE CHEST 1 VIEW COMPARISON:  05/03/2017 FINDINGS: Cardiomegaly with vascular congestion. Bilateral interstitial and airspace opacities concerning for edema. Small bilateral pleural effusions and bibasilar atelectasis. IMPRESSION: Cardiomegaly, mild pulmonary edema. Small effusions with bibasilar atelectasis. Electronically Signed   By: Rolm Baptise M.D.   On: 08/19/2018 22:46   Vas US Renal Artery Duplex  Result Date: 08/25/2018 ABDOMINAL VISCERAL Indications: Malignant hypertension. Limitations: Air/bowel gas. Performing Technologist: Oda Cogan RDMS, RVT  Examination Guidelines: A complete evaluation includes B-mode imaging, spectral Doppler, color Doppler, and power Doppler as needed of all accessible portions of each vessel. Bilateral testing is considered an  integral part of a complete examination. Limited examinations for reoccurring indications may be performed as noted.  Duplex Findings: +----------------------+--------+--------+------+--------+ Mesenteric            PSV cm/sEDV cm/sPlaqueComments +----------------------+--------+--------+------+--------+ Aorta Prox              110                          +----------------------+--------+--------+------+--------+ Celiac Artery Origin     99                          +----------------------+--------+--------+------+--------+ Celiac Artery Proximal  293                          +----------------------+--------+--------+------+--------+  +------------------+--------+--------+-------+ Right Renal ArteryPSV cm/sEDV cm/sComment +------------------+--------+--------+-------+ Origin               85      18           +------------------+--------+--------+-------+ Proximal             63      10           +------------------+--------+--------+-------+ Mid                  72      6            +------------------+--------+--------+-------+  Distal               33      9            +------------------+--------+--------+-------+ +-----------------+--------+--------+-------+ Left Renal ArteryPSV cm/sEDV cm/sComment +-----------------+--------+--------+-------+ Origin              81      10           +-----------------+--------+--------+-------+ Proximal           111      13           +-----------------+--------+--------+-------+ Mid                107      12           +-----------------+--------+--------+-------+ Distal              55      10           +-----------------+--------+--------+-------+ Technologist observations Renal Artery(s):left kidney appears smaller than the right, but measured within normal length. +------------+--------+--------+----+-----------+--------+--------+----+ Right KidneyPSV cm/sEDV cm/sRI  Left KidneyPSV cm/sEDV  cm/sRI   +------------+--------+--------+----+-----------+--------+--------+----+ Upper Pole  20      6       0.67Upper Pole 22      7       0.70 +------------+--------+--------+----+-----------+--------+--------+----+ Mid         17      6       0.63Mid        23      6       0.73 +------------+--------+--------+----+-----------+--------+--------+----+ Lower Pole  16      4       0.70Lower Pole 19      6       0.70 +------------+--------+--------+----+-----------+--------+--------+----+ Hilar       19      0       1.00Hilar      23      5       0.78 +------------+--------+--------+----+-----------+--------+--------+----+ +------------------+-----+------------------+-----+ Right Kidney           Left Kidney             +------------------+-----+------------------+-----+ RAR                    RAR                     +------------------+-----+------------------+-----+ RAR (manual)      0.76 RAR (manual)      1.0   +------------------+-----+------------------+-----+ Cortex                 Cortex                  +------------------+-----+------------------+-----+ Cortex thickness       Corex thickness         +------------------+-----+------------------+-----+ Kidney length (cm)11.70Kidney length (cm)10.30 +------------------+-----+------------------+-----+  Summary: Renal:  Right: No evidence of right renal artery stenosis. Normal right        Resisitive Index. Left:  No evidence of left renal artery stenosis. Abnormal left        Resisitve Index. Left kidney appears smaller than the right,        but measured within normal length with increased        echogenicity.  *See table(s) above for measurements and observations.  Diagnosing physician: Harold Barban MD  Electronically signed by Harold Barban MD on 08/25/2018 at 2:18:43 PM.  Final    Microbiology: Recent Results (from the past 240 hour(s))  SARS Coronavirus 2 (CEPHEID- Performed in [place] hospital lab), Hosp Order     Status: None   Collection Time: 08/19/18 10:00 PM   Specimen: Nasopharyngeal Swab  Result Value Ref Range Status   SARS Coronavirus 2 NEGATIVE NEGATIVE Final    Comment: (NOTE) If result is NEGATIVE SARS-CoV-2 target nucleic acids are NOT DETECTED. The SARS-CoV-2 RNA is generally detectable in upper and lower  respiratory specimens during the acute phase of infection. The lowest  concentration of SARS-CoV-2 viral copies this assay can detect is 250  copies / mL. A negative result does not preclude SARS-CoV-2 infection  and should not be used as the sole basis for treatment or other  patient management decisions.  A negative result may occur with  improper specimen collection / handling, submission of specimen other  than nasopharyngeal swab, presence of viral mutation(s) within the  areas targeted by this assay, and inadequate number of viral copies  (<250 copies / mL). A negative result must be combined with clinical  observations, patient history, and epidemiological information. If result is POSITIVE SARS-CoV-2 target nucleic acids are DETECTED. The SARS-CoV-2 RNA is generally detectable in upper and lower  respiratory specimens dur ing the acute phase of infection.  Positive  results are indicative of active infection with SARS-CoV-2.  Clinical  correlation with patient history and other diagnostic information is  necessary to determine patient infection status.  Positive results do  not rule out bacterial infection or co-infection with other viruses. If result is PRESUMPTIVE POSTIVE SARS-CoV-2 nucleic acids MAY BE PRESENT.   A presumptive positive result was obtained on the submitted specimen  and confirmed on repeat testing.  While 2019 novel coronavirus  (SARS-CoV-2) nucleic acids may be present in the submitted sample  additional confirmatory testing may be necessary for epidemiological  and / or clinical management purposes  to  differentiate between  SARS-CoV-2 and other Sarbecovirus currently known to infect humans.  If clinically indicated additional testing with an alternate test  methodology [phone]) is advised. The SARS-CoV-2 RNA is generally  detectable in upper and lower respiratory sp ecimens during the acute  phase of infection. The expected result is Negative. Fact Sheet for Patients:  StrictlyIdeas.no Fact Sheet for Healthcare Providers: BankingDealers.co.za This test is not yet approved or cleared by the [place] FDA and has been authorized for detection and/or diagnosis of SARS-CoV-2 by FDA under an Emergency Use Authorization (EUA).  This EUA will remain in effect (meaning this test can be used) for the duration of the COVID-19 declaration under Section 564(b)(1) of the Act, 21 U.S.C. section 360bbb-3(b)(1), unless the authorization is terminated or revoked sooner. Performed at [facility], [address], [place], [place] [address]   MRSA PCR Screening     Status: None   Collection Time: 08/20/18  1:27 AM   Specimen: Nasal Mucosa; Nasopharyngeal  Result Value Ref Range Status   MRSA by PCR NEGATIVE NEGATIVE Final    Comment:        The GeneXpert MRSA Assay (FDA approved for NASAL specimens only), is one component of a comprehensive MRSA colonization surveillance program. It is not intended to diagnose MRSA infection nor to guide or monitor treatment for MRSA infections. Performed at [facility], [address], [place], [place] [address]   Urine culture     Status: Abnormal   Collection Time: 08/22/18 10:36 AM   Specimen: Urine, Clean  Catch  Result Value Ref Range Status   Specimen Description   Final    URINE, CLEAN CATCH Performed at [facility], [address], [place], [place] [address]    Special Requests   Final    NONE Performed at [facility], [address], [place], [place] [address]    Culture MULTIPLE SPECIES  PRESENT, SUGGEST RECOLLECTION (A)  Final   Report Status 08/23/2018 FINAL  Final     Labs: Basic Metabolic Panel: Recent Labs  Lab 08/21/18 0449 08/22/18 0356 08/23/18 0422 08/24/18 0621 08/25/18 0644  NA 134* 136 139 141 142  K 4.0 3.7 3.3* 3.1* 3.9  CL 100 102 103 106 103  CO2 24 26 26 28 28   GLUCOSE 254* 228* 138* 112* 138*  BUN 36* 34* 30* 32* 36*  CREATININE 2.43* 2.41* 2.05* 2.25* 2.54*  CALCIUM 8.5* 8.4* 8.3* 8.2* 7.9*  MG 2.8*  --  2.2  --  2.0   Liver Function Tests: Recent Labs  Lab 08/19/18 2212 08/20/18 0435  AST 38 26  ALT 59* 47*  ALKPHOS 155* 134*  BILITOT 0.2* 0.5  PROT 6.4* 5.4*  ALBUMIN 3.1* 2.7*   Recent Labs  Lab 08/19/18 2212  LIPASE 24   CBC: Recent Labs  Lab 08/19/18 2212 08/20/18 0435 08/22/18 0356 08/23/18 0422 08/24/18 0621  WBC 12.5* 9.6 14.5* 11.6* 8.6  NEUTROABS 9.7*  --   --   --   --   HGB 10.7* 9.2* 9.9* 10.6* 10.5*  HCT 34.8* 30.6* 33.3* 35.4* 34.5*  MCV 86.1 87.7 87.9 87.2 85.2  PLT 276 239 275 303 317   Cardiac Enzymes: Recent Labs  Lab 08/19/18 2212 08/20/18 0435 08/20/18 1115 08/20/18 1648  TROPONINI 0.03* 0.06* 0.05* 0.04*   BNP (last 3 results) Recent Labs    08/19/18 2212 08/22/18 0357  BNP 416.0* 573.0*    CBG: Recent Labs  Lab 08/24/18 1106 08/24/18 1613 08/24/18 2140 08/25/18 0725 08/25/18 1520  GLUCAP 229* 126* 77 132* 213*    Signed:  Barton Dubois MD.  [facility] 08/25/2018, 4:21 PM

## 2018-08-25 NOTE — Progress Notes (Signed)
Lorra Hals returned call and instructed RN to hold Lantus this evening for patient's CBG of 77 and being NPO after midnight. Lantus held per order.  P.J. Linus Mako, RN

## 2018-08-25 NOTE — Plan of Care (Signed)
Patient discharged in stable condition, discharge instructions given to patient and husband with husband voicing full understanding.

## 2018-08-25 NOTE — Care Management Important Message (Signed)
Important Message  Patient Details  Name: Holly Baldwin MRN: [id number] Date of Birth: 02/17/1946   Medicare Important Message Given:  Yes     Tommy Medal 08/25/2018, 2:26 PM

## 2018-08-25 NOTE — Progress Notes (Signed)
Shift Summary: Pt went to [facility] for Renal US via [facility] this shift. Returned approximately at 1430. Stable, no distress, no c/o pain or discomfort. Reviewed AVS with patient. Verbalized understanding. After shift change- pt expressed need for further review of AVS med instructions. Receiving RN to review AVS instructions with husband prior to discharge.

## 2018-08-25 NOTE — Progress Notes (Signed)
RN and NT accompanied patient to door to husband and waiting vehicle.  RN explained discharge instructions, including medications and appointments, to both husband and patient with husband.  Patient was somewhat confused about instructions, but husband voiced full understanding.  Patient discharged in stable condition with all belongings.  P.J. Linus Mako, RN

## 2018-08-25 NOTE — Progress Notes (Signed)
D/C'd in system only-for [place] test entry at [facility]. Pt  is not d/c'd from [facility].

## 2018-08-26 ENCOUNTER — Emergency Department (HOSPITAL_COMMUNITY)
Admission: EM | Admit: 2018-08-26 | Discharge: 2018-08-27 | Disposition: A | Discharge status: Home / Self Care | Payer: Medicare HMO | Attending: Emergency Medicine | Admitting: Emergency Medicine

## 2018-08-26 ENCOUNTER — Other Ambulatory Visit: Payer: Self-pay

## 2018-08-26 ENCOUNTER — Encounter (HOSPITAL_COMMUNITY): Payer: Self-pay | Admitting: Emergency Medicine

## 2018-08-26 DIAGNOSIS — I4581 Long QT syndrome: Secondary | ICD-10-CM | POA: Diagnosis not present

## 2018-08-26 DIAGNOSIS — Z7982 Long term (current) use of aspirin: Secondary | ICD-10-CM | POA: Insufficient documentation

## 2018-08-26 DIAGNOSIS — E1140 Type 2 diabetes mellitus with diabetic neuropathy, unspecified: Secondary | ICD-10-CM | POA: Diagnosis not present

## 2018-08-26 DIAGNOSIS — I160 Hypertensive urgency: Secondary | ICD-10-CM

## 2018-08-26 DIAGNOSIS — E1142 Type 2 diabetes mellitus with diabetic polyneuropathy: Secondary | ICD-10-CM | POA: Diagnosis not present

## 2018-08-26 DIAGNOSIS — E0842 Diabetes mellitus due to underlying condition with diabetic polyneuropathy: Secondary | ICD-10-CM | POA: Insufficient documentation

## 2018-08-26 DIAGNOSIS — R109 Unspecified abdominal pain: Secondary | ICD-10-CM | POA: Diagnosis not present

## 2018-08-26 DIAGNOSIS — R4182 Altered mental status, unspecified: Secondary | ICD-10-CM | POA: Diagnosis not present

## 2018-08-26 DIAGNOSIS — Z794 Long term (current) use of insulin: Secondary | ICD-10-CM | POA: Diagnosis not present

## 2018-08-26 DIAGNOSIS — I110 Hypertensive heart disease with heart failure: Secondary | ICD-10-CM | POA: Diagnosis not present

## 2018-08-26 DIAGNOSIS — E039 Hypothyroidism, unspecified: Secondary | ICD-10-CM | POA: Diagnosis not present

## 2018-08-26 DIAGNOSIS — R404 Transient alteration of awareness: Secondary | ICD-10-CM | POA: Diagnosis not present

## 2018-08-26 DIAGNOSIS — R112 Nausea with vomiting, unspecified: Secondary | ICD-10-CM | POA: Diagnosis present

## 2018-08-26 DIAGNOSIS — I509 Heart failure, unspecified: Secondary | ICD-10-CM | POA: Insufficient documentation

## 2018-08-26 DIAGNOSIS — Z79899 Other long term (current) drug therapy: Secondary | ICD-10-CM | POA: Insufficient documentation

## 2018-08-26 DIAGNOSIS — R110 Nausea: Secondary | ICD-10-CM | POA: Diagnosis not present

## 2018-08-26 LAB — GLUCOSE, CAPILLARY: Glucose-Capillary: 194 mg/dL — ABNORMAL HIGH (ref 70–99)

## 2018-08-26 NOTE — ED Triage Notes (Signed)
Pt states she was d/c today from the hospital. Pt states she went home and her husband gave her medicine that she was d/c with and she doesn't think he gave them to her right. Pt states "ive been feeling so sick since he gave me that medicine and I havent eaten anything and my feet burn like they are on fire."

## 2018-08-27 ENCOUNTER — Emergency Department (HOSPITAL_COMMUNITY): Payer: Medicare HMO

## 2018-08-27 DIAGNOSIS — R110 Nausea: Secondary | ICD-10-CM | POA: Diagnosis not present

## 2018-08-27 DIAGNOSIS — R4182 Altered mental status, unspecified: Secondary | ICD-10-CM | POA: Diagnosis not present

## 2018-08-27 DIAGNOSIS — R112 Nausea with vomiting, unspecified: Secondary | ICD-10-CM | POA: Diagnosis not present

## 2018-08-27 LAB — CBC WITH DIFFERENTIAL/PLATELET
Abs Immature Granulocytes: 0.04 10*3/uL (ref 0.00–0.07)
Basophils Absolute: 0.1 10*3/uL (ref 0.0–0.1)
Basophils Relative: 1 %
Eosinophils Absolute: 0.2 10*3/uL (ref 0.0–0.5)
Eosinophils Relative: 2 %
HCT: 31.9 % — ABNORMAL LOW (ref 36.0–46.0)
Hemoglobin: 9.5 g/dL — ABNORMAL LOW (ref 12.0–15.0)
Immature Granulocytes: 0 %
Lymphocytes Relative: 18 %
Lymphs Abs: 1.6 10*3/uL (ref 0.7–4.0)
MCH: 26.2 pg (ref 26.0–34.0)
MCHC: 29.8 g/dL — ABNORMAL LOW (ref 30.0–36.0)
MCV: 87.9 fL (ref 80.0–100.0)
Monocytes Absolute: 1.1 10*3/uL — ABNORMAL HIGH (ref 0.1–1.0)
Monocytes Relative: 12 %
Neutro Abs: 6.2 10*3/uL (ref 1.7–7.7)
Neutrophils Relative %: 67 %
Platelets: 344 10*3/uL (ref 150–400)
RBC: 3.63 MIL/uL — ABNORMAL LOW (ref 3.87–5.11)
RDW: 15.2 % (ref 11.5–15.5)
WBC: 9.2 10*3/uL (ref 4.0–10.5)
nRBC: 0 % (ref 0.0–0.2)

## 2018-08-27 LAB — COMPREHENSIVE METABOLIC PANEL
ALT: 50 U/L — ABNORMAL HIGH (ref 0–44)
AST: 38 U/L (ref 15–41)
Albumin: 2.8 g/dL — ABNORMAL LOW (ref 3.5–5.0)
Alkaline Phosphatase: 103 U/L (ref 38–126)
Anion gap: 10 (ref 5–15)
BUN: 38 mg/dL — ABNORMAL HIGH (ref 8–23)
CO2: 26 mmol/L (ref 22–32)
Calcium: 8.4 mg/dL — ABNORMAL LOW (ref 8.9–10.3)
Chloride: 104 mmol/L (ref 98–111)
Creatinine, Ser: 2.39 mg/dL — ABNORMAL HIGH (ref 0.44–1.00)
GFR calc Af Amer: 22 mL/min — ABNORMAL LOW (ref >60)
GFR calc non Af Amer: 19 mL/min — ABNORMAL LOW (ref >60)
Glucose, Bld: 103 mg/dL — ABNORMAL HIGH (ref 70–99)
Potassium: 4.1 mmol/L (ref 3.5–5.1)
Sodium: 140 mmol/L (ref 135–145)
Total Bilirubin: 0.4 mg/dL (ref 0.3–1.2)
Total Protein: 5.9 g/dL — ABNORMAL LOW (ref 6.5–8.1)

## 2018-08-27 LAB — URINALYSIS, ROUTINE W REFLEX MICROSCOPIC
Bacteria, UA: NONE SEEN
Bilirubin Urine: NEGATIVE
Glucose, UA: 50 mg/dL — AB
Hgb urine dipstick: NEGATIVE
Ketones, ur: NEGATIVE mg/dL
Leukocytes,Ua: NEGATIVE
Nitrite: NEGATIVE
Protein, ur: 100 mg/dL — AB
Specific Gravity, Urine: 1.008 (ref 1.005–1.030)
pH: 7 (ref 5.0–8.0)

## 2018-08-27 LAB — LIPASE, BLOOD: Lipase: 26 U/L (ref 11–51)

## 2018-08-27 LAB — TROPONIN I (HIGH SENSITIVITY)
Troponin I (High Sensitivity): 18 ng/L — ABNORMAL HIGH (ref <18)
Troponin I (High Sensitivity): 18 ng/L — ABNORMAL HIGH (ref <18)
Troponin I (High Sensitivity): 18 ng/L — ABNORMAL HIGH (ref <18)

## 2018-08-27 MED ORDER — HYDRALAZINE HCL 20 MG/ML IJ SOLN
10.0000 mg | Freq: Once | INTRAMUSCULAR | Status: AC
  Administered 2018-08-27: 10 mg via INTRAVENOUS
  Filled 2018-08-27: qty 1

## 2018-08-27 MED ORDER — CARVEDILOL 12.5 MG PO TABS
25.0000 mg | ORAL_TABLET | Freq: Two times a day (BID) | ORAL | Status: DC
  Administered 2018-08-27: 25 mg via ORAL
  Filled 2018-08-27: qty 2

## 2018-08-27 MED ORDER — AMLODIPINE BESYLATE 5 MG PO TABS
10.0000 mg | ORAL_TABLET | Freq: Every day | ORAL | Status: DC
  Administered 2018-08-27: 10 mg via ORAL
  Filled 2018-08-27: qty 2

## 2018-08-27 NOTE — ED Provider Notes (Signed)
[facility] EMERGENCY DEPARTMENT Provider Note   CSN: [id number] Arrival date & time: 08/26/18  2032     History   Chief Complaint Chief Complaint  Patient presents with   Nausea    HPI Holly Baldwin is a 73 y.o. female.     Patient is a poor historian.  States she is here because she has burning in her feet as well as nausea.  This onset after she took her medications at home.  She believes that her husband administered her medications inappropriately.  She is not certain which medication she is having a reaction to.  She denies any difficulty breathing or difficulty swallowing.  No chest pain.  Does have a history of diabetes and neuropathy but states she never has burning in her feet like this before.  She has had nausea but no vomiting.  No abdominal pain.  No chest pain or shortness of breath.  Patient discharge from the hospital on June 24 with episode of diastolic heart failure and hypertensive emergency.    Medications filled on the 24th with amlodipine 10 mg, hydralazine 100 mg, furosemide 40 mg, carvedilol 25 mg, isosorbide mononitrate 60 mg, glipizide 5 mg  Discussed with patient's husband [phone].  States the patient developed burning in her bilateral feet approximately 15 minutes after taking her new medications.  This is never happened before.  There is no chest pain or shortness of breath.  There was no vomiting but there was some nausea.  There is no tongue or lip swelling. He gave her all of her medications at once. This is the first time she's taken these medications since leaving the hospital.  The history is provided by the patient.    Past Medical History:  Diagnosis Date   Anxiety    Chronic abdominal pain    Diabetes mellitus    GERD (gastroesophageal reflux disease)    Hiatal hernia    Hypercholesteremia    Hypertension     Patient Active Problem List   Diagnosis Date Noted   Acute on chronic diastolic CHF (congestive heart failure)  ([place]) 08/21/2018   Abnormal liver function    Hypertensive emergency    Acute CHF (congestive heart failure) ([place]) 08/20/2018   Hypokalemia 08/20/2018   Anemia 08/20/2018   Acute renal failure superimposed on stage 3 chronic kidney disease ([place]) 08/20/2018   Acute CHF ([place]) 08/20/2018   Acute respiratory failure with hypoxia ([place])    Vitamin D deficiency 03/17/2017   Class 2 severe obesity due to excess calories with serious comorbidity and body mass index (BMI) of 38.0 to 38.9 in adult ([place]) 07/10/2015   Essential hypertension, benign 01/02/2015   Mixed hyperlipidemia 01/02/2015   Unspecified gastritis and gastroduodenitis without mention of hemorrhage 05/06/2012   Abdominal pain 05/05/2012   Nausea & vomiting 05/05/2012   Depression 05/05/2012   GERD (gastroesophageal reflux disease) 05/05/2012   Uncontrolled type 2 diabetes mellitus with complication, with long-term current use of insulin ([place]) 05/05/2012   Primary hypothyroidism 05/05/2012   Hiatal hernia 05/05/2012   Malignant hypertension 05/05/2012   SHOULDER PAIN 03/25/2007   IMPINGEMENT SYNDROME 03/25/2007   RUPTURE ROTATOR CUFF 03/25/2007   HIGH BLOOD PRESSURE 03/25/2007    Past Surgical History:  Procedure Laterality Date   ABDOMINAL HYSTERECTOMY     COLONOSCOPY N/A 06/23/2012   Procedure: COLONOSCOPY;  Surgeon: Rogene Houston, MD;  Location: [facility] ENDO SUITE;  Service: Endoscopy;  Laterality: N/A;  100-moved to 1200 Ann to notify pt  ESOPHAGOGASTRODUODENOSCOPY N/A 05/06/2012   Procedure: ESOPHAGOGASTRODUODENOSCOPY (EGD);  Surgeon: Rogene Houston, MD;  Location: [facility] ENDO SUITE;  Service: Endoscopy;  Laterality: N/A;   MASS EXCISION Right 05/26/2012   Procedure: EXCISION NEOPLASM RIGHT THIGH;  Surgeon: Jamesetta So, MD;  Location: [facility] ORS;  Service: General;  Laterality: Right;     OB History   No obstetric history on file.      Home Medications    Prior to Admission medications     Medication Sig Start Date End Date Taking? Authorizing Provider  albuterol (PROVENTIL HFA;VENTOLIN HFA) 108 (90 Base) MCG/ACT inhaler Inhale into the lungs every 6 (six) hours as needed for wheezing or shortness of breath.    [provider]  amLODipine (NORVASC) 10 MG tablet Take 1 tablet (10 mg total) by mouth daily. 08/26/18   Barton Dubois, MD  aspirin EC 81 MG tablet Take 81 mg by mouth at bedtime.    [provider]  budesonide-formoterol (SYMBICORT) 160-4.5 MCG/ACT inhaler Inhale 2 puffs into the lungs 2 (two) times daily.    [provider]  capsaicin (ZOSTRIX) 0.025 % cream Apply topically 2 (two) times daily.    [provider]  carvedilol (COREG) 25 MG tablet Take 1 tablet (25 mg total) by mouth 2 (two) times daily with a meal. 08/25/18   Barton Dubois, MD  Cholecalciferol (VITAMIN D3) 125 MCG (5000 UT) CAPS TAKE 1 CAPSULE EVERY DAY 06/07/18   Nida, Marella Chimes, MD  cycloSPORINE (RESTASIS) 0.05 % ophthalmic emulsion Place 1 drop into both eyes daily.    [provider]  donepezil (ARICEPT) 5 MG tablet Take 5 mg by mouth at bedtime.  07/12/18   [provider]  EPINEPHrine (EPI-PEN) 0.3 mg/0.3 mL DEVI Inject 0.3 mg into the muscle once.    [provider]  FLUoxetine (PROZAC) 20 MG capsule Take 20 mg by mouth every morning.     [provider]  furosemide (LASIX) 40 MG tablet Take 1 tablet (40 mg total) by mouth daily. 08/26/18   Barton Dubois, MD  gabapentin (NEURONTIN) 100 MG capsule TAKE 1 CAPSULE TWICE DAILY 06/07/18   Cassandria Anger, MD  glipiZIDE (GLUCOTROL) 5 MG tablet Take 1 tablet (5 mg total) by mouth daily before breakfast. 08/25/18 12/23/18  Barton Dubois, MD  hydrALAZINE (APRESOLINE) 100 MG tablet Take 1 tablet (100 mg total) by mouth 3 (three) times daily. 08/25/18   Barton Dubois, MD  Insulin Glargine (LANTUS SOLOSTAR) 100 UNIT/ML Solostar Pen Inject 50 Units into the skin at bedtime. INJECT  50  UNITS SUBCUTANEOUSLY  AT  10PM 08/23/18   Cassandria Anger, MD  isosorbide mononitrate (IMDUR) 60 MG 24 hr tablet Take 1 tablet (60 mg total) by mouth daily. 08/26/18   Barton Dubois, MD  levothyroxine (SYNTHROID, LEVOTHROID) 137 MCG tablet Take 1 tablet (137 mcg total) by mouth daily before breakfast. 06/10/18   Cassandria Anger, MD  simvastatin (ZOCOR) 40 MG tablet TAKE 1 TABLET EVERY DAY 10/05/17   Fayrene Helper, MD  topiramate (TOPAMAX) 100 MG tablet Take 1 tablet by mouth 2 (two) times daily. 04/08/17   [provider]  ziprasidone (GEODON) 80 MG capsule Take 80 mg by mouth at bedtime.      [provider]    Family History Family History  Problem Relation Age of Onset   Diabetes Mother    Diabetes Father    Diabetes Sister    Diabetes Brother    Colon cancer  Neg Hx    Colon polyps Neg Hx     Social History Social History   Tobacco Use   Smoking status: Never Smoker   Smokeless tobacco: Never Used  Substance Use Topics   Alcohol use: No   Drug use: No     Allergies   Bee venom, Ace inhibitors, and Penicillins   Review of Systems Review of Systems  Constitutional: Positive for activity change, appetite change and fatigue.  Respiratory: Negative for chest tightness and shortness of breath.   Cardiovascular: Negative for chest pain.  Gastrointestinal: Positive for abdominal pain and nausea. Negative for vomiting.  Genitourinary: Negative for dysuria and hematuria.  Musculoskeletal: Positive for arthralgias and myalgias.  Skin: Negative for rash.  Neurological: Positive for weakness. Negative for dizziness, light-headedness and headaches.   all other systems are negative except as noted in the HPI and PMH.     Physical Exam Updated Vital Signs BP (!) 148/63 (BP Location: Right Arm)    Pulse (!) 54    Temp 98.3 F (36.8 C) (Oral)    Resp 18    Ht 5\' 4"  (1.626 m)    Wt 90.7 kg    SpO2 98%    BMI 34.33 kg/m   Physical  Exam Vitals signs and nursing note reviewed.  Constitutional:      General: She is not in acute distress.    Appearance: She is well-developed. She is obese.     Comments: Somnolent, arouses and answers questions appropriately  HENT:     Head: Normocephalic and atraumatic.     Mouth/Throat:     Pharynx: No oropharyngeal exudate.  Eyes:     Conjunctiva/sclera: Conjunctivae normal.     Pupils: Pupils are equal, round, and reactive to light.  Neck:     Musculoskeletal: Normal range of motion and neck supple.     Comments: No meningismus. Cardiovascular:     Rate and Rhythm: Normal rate and regular rhythm.     Heart sounds: Normal heart sounds. No murmur.  Pulmonary:     Effort: Pulmonary effort is normal. No respiratory distress.     Breath sounds: Normal breath sounds.  Abdominal:     Palpations: Abdomen is soft.     Tenderness: There is no abdominal tenderness. There is no guarding or rebound.  Musculoskeletal: Normal range of motion.        General: No tenderness.     Comments: Bilateral feet normal to inspection. Intact DP and PT pulses bilaterally. Compartments soft.   Skin:    General: Skin is warm.  Neurological:     Mental Status: She is alert and oriented to person, place, and time.     Cranial Nerves: No cranial nerve deficit.     Motor: No abnormal muscle tone.     Coordination: Coordination normal.     Comments: No ataxia on finger to nose bilaterally. No pronator drift. 5/5 strength throughout. CN 2-12 intact.Equal grip strength. Sensation intact.   Psychiatric:        Behavior: Behavior normal.      ED Treatments / Results  Labs (all labs ordered are listed, but only abnormal results are displayed) Labs Reviewed  CBC WITH DIFFERENTIAL/PLATELET - Abnormal; Notable for the following components:      Result Value   RBC 3.63 (*)    Hemoglobin 9.5 (*)    HCT 31.9 (*)    MCHC 29.8 (*)    Monocytes Absolute 1.1 (*)    All other  components within normal limits   COMPREHENSIVE METABOLIC PANEL - Abnormal; Notable for the following components:   Glucose, Bld 103 (*)    BUN 38 (*)    Creatinine, Ser 2.39 (*)    Calcium 8.4 (*)    Total Protein 5.9 (*)    Albumin 2.8 (*)    ALT 50 (*)    GFR calc non Af Amer 19 (*)    GFR calc Af Amer 22 (*)    All other components within normal limits  URINALYSIS, ROUTINE W REFLEX MICROSCOPIC - Abnormal; Notable for the following components:   Color, Urine STRAW (*)    Glucose, UA 50 (*)    Protein, ur 100 (*)    All other components within normal limits  TROPONIN I (HIGH SENSITIVITY) - Abnormal; Notable for the following components:   Troponin I (High Sensitivity) 18.00 (*)    All other components within normal limits  TROPONIN I (HIGH SENSITIVITY) - Abnormal; Notable for the following components:   Troponin I (High Sensitivity) 18.00 (*)    All other components within normal limits  TROPONIN I (HIGH SENSITIVITY) - Abnormal; Notable for the following components:   Troponin I (High Sensitivity) 18.00 (*)    All other components within normal limits  LIPASE, BLOOD    EKG EKG Interpretation  Date/Time:  Friday August 27 2018 01:37:16 EDT Ventricular Rate:  64 PR Interval:    QRS Duration: 99 QT Interval:  493 QTC Calculation: 509 R Axis:   37 Text Interpretation:  Sinus rhythm Prolonged QT interval Baseline wander in lead(s) V3 No significant change was found Confirmed by Ezequiel Essex [phone]) on 08/27/2018 1:41:41 AM   Radiology Ct Abdomen Pelvis Wo Contrast  Result Date: 08/27/2018 CLINICAL DATA:  Nausea and vomiting EXAM: CT ABDOMEN AND PELVIS WITHOUT CONTRAST TECHNIQUE: Multidetector CT imaging of the abdomen and pelvis was performed following the standard protocol without IV contrast. COMPARISON:  06/02/2012 FINDINGS: Lower chest:  Atelectatic type opacities at the lung bases. Hepatobiliary: Ill-defined low-density in the right liver measuring 13 mm, stable from 2014 and thus benign.  Cholecystectomy. Pancreas: Unremarkable. Spleen: Unremarkable. Adrenals/Urinary Tract: Negative adrenals. No hydronephrosis or ureteral stone. Interpolar calcification on the left. Unremarkable bladder. Stomach/Bowel:  No obstruction. No appendicitis. Vascular/Lymphatic: No acute vascular abnormality. No mass or adenopathy. Reproductive:No pathologic findings.  Hysterectomy. Other: No ascites or pneumoperitoneum.  Fatty low midline hernia Musculoskeletal: Degenerative changes without acute finding. IMPRESSION: 1. No emergent finding. No bowel obstruction or visible inflammation. 2. Atelectatic opacities at the lung bases. Electronically Signed   By: Monte Fantasia M.D.   On: 08/27/2018 07:00   Ct Head Wo Contrast  Result Date: 08/27/2018 CLINICAL DATA:  Altered level of consciousness. EXAM: CT HEAD WITHOUT CONTRAST TECHNIQUE: Contiguous axial images were obtained from the base of the skull through the vertex without intravenous contrast. COMPARISON:  Jul 16, 2016. FINDINGS: Brain: No evidence of acute infarction, hemorrhage, hydrocephalus, extra-axial collection or mass lesion/mass effect. Chronic microvascular ischemic changes and volume loss is again noted. There is a new hypoattenuating area in the left basal ganglia favored to represent a lacunar infarct. Vascular: No hyperdense vessel or unexpected calcification. Skull: Normal. Negative for fracture or focal lesion. Sinuses/Orbits: No acute finding. Other: None. IMPRESSION: No acute intracranial abnormality. Electronically Signed   By: Constance Holster M.D.   On: 08/27/2018 02:59   Dg Abdomen Acute W/chest  Result Date: 08/27/2018 CLINICAL DATA:  Nausea. EXAM: DG ABDOMEN ACUTE W/ 1V CHEST COMPARISON:  Chest  x-ray dated October 10, 2014. FINDINGS: Heart size is enlarged. There is mild volume overload without overt pulmonary edema. There are streaky bibasilar airspace opacities favored to represent chronic atelectasis or scarring. There is no  pneumothorax. There is no acute osseous abnormality. The bowel gas pattern is nonobstructive. There is gaseous distention of multiple loops of small bowel scattered throughout the abdomen. There is some mild gaseous distention of multiple loops of colon. There is a moderate amount of stool throughout the colon. Phleboliths project over the patient's pelvis. IMPRESSION: 1. Nonobstructive bowel gas pattern. 2. Moderate amount of stool throughout the colon. 3. Cardiomegaly with mild vascular congestion. 4. Streaky bibasilar airspace opacities favored to represent chronic scarring or atelectasis. Electronically Signed   By: Constance Holster M.D.   On: 08/27/2018 02:49   Vas US Renal Artery Duplex  Result Date: 08/25/2018 ABDOMINAL VISCERAL Indications: Malignant hypertension. Limitations: Air/bowel gas. Performing Technologist: Oda Cogan RDMS, RVT  Examination Guidelines: A complete evaluation includes B-mode imaging, spectral Doppler, color Doppler, and power Doppler as needed of all accessible portions of each vessel. Bilateral testing is considered an integral part of a complete examination. Limited examinations for reoccurring indications may be performed as noted.  Duplex Findings: +----------------------+--------+--------+------+--------+  Mesenteric             PSV cm/s EDV cm/s Plaque Comments  +----------------------+--------+--------+------+--------+  Aorta Prox               110                              +----------------------+--------+--------+------+--------+  Celiac Artery Origin      99                              +----------------------+--------+--------+------+--------+  Celiac Artery Proximal   293                              +----------------------+--------+--------+------+--------+  +------------------+--------+--------+-------+  Right Renal Artery PSV cm/s EDV cm/s Comment  +------------------+--------+--------+-------+  Origin                85       18              +------------------+--------+--------+-------+  Proximal              63       10             +------------------+--------+--------+-------+  Mid                   72       6              +------------------+--------+--------+-------+  Distal                33       9              +------------------+--------+--------+-------+ +-----------------+--------+--------+-------+  Left Renal Artery PSV cm/s EDV cm/s Comment  +-----------------+--------+--------+-------+  Origin               81       10             +-----------------+--------+--------+-------+  Proximal            111       13             +-----------------+--------+--------+-------+  Mid                 107       12             +-----------------+--------+--------+-------+  Distal               55       10             +-----------------+--------+--------+-------+ Technologist observations Renal Artery(s):left kidney appears smaller than the right, but measured within normal length. +------------+--------+--------+----+-----------+--------+--------+----+  Right Kidney PSV cm/s EDV cm/s RI   Left Kidney PSV cm/s EDV cm/s RI    +------------+--------+--------+----+-----------+--------+--------+----+  Upper Pole   20       6        0.67 Upper Pole  22       7        0.70  +------------+--------+--------+----+-----------+--------+--------+----+  Mid          17       6        0.63 Mid         23       6        0.73  +------------+--------+--------+----+-----------+--------+--------+----+  Lower Pole   16       4        0.70 Lower Pole  19       6        0.70  +------------+--------+--------+----+-----------+--------+--------+----+  Hilar        19       0        1.00 Hilar       23       5        0.78  +------------+--------+--------+----+-----------+--------+--------+----+ +------------------+-----+------------------+-----+  Right Kidney             Left Kidney               +------------------+-----+------------------+-----+  RAR                      RAR                        +------------------+-----+------------------+-----+  RAR (manual)       0.76  RAR (manual)       1.0    +------------------+-----+------------------+-----+  Cortex                   Cortex                    +------------------+-----+------------------+-----+  Cortex thickness         Corex thickness           +------------------+-----+------------------+-----+  Kidney length (cm) 11.70 Kidney length (cm) 10.30  +------------------+-----+------------------+-----+  Summary: Renal:  Right: No evidence of right renal artery stenosis. Normal right        Resisitive Index. Left:  No evidence of left renal artery stenosis. Abnormal left        Resisitve Index. Left kidney appears smaller than the right,        but measured within normal length with increased        echogenicity.  *See table(s) above for measurements and observations.  Diagnosing physician: Harold Barban MD  Electronically signed by Harold Barban MD on 08/25/2018 at 2:18:43 PM.    Final     Procedures Procedures (including critical care time)  Medications Ordered in ED Medications -  No data to display   Initial Impression / Assessment and Plan / ED Course  I have reviewed the triage vital signs and the nursing notes.  Pertinent labs & imaging results that were available during my care of the patient were reviewed by me and considered in my medical decision making (see chart for details).       Patient with burning in her feet after taking medications tonight.  She has some nausea but no vomiting.  Recent hospitalization for diastolic heart failure and hypertensive emergency.  Multiple new medications prescribed as above.  EKG is unchanged.  Chest x-ray shows mild vascular congestion and scarring.  Blood pressure elevated and patient given her home medications.  She does have burning in her feet but none evidence of neurovascular compromise.  She has intact distal pulses.  Suspect she likely has diabetic  neuropathy. No evidence of anaphylaxis to her tongue or lip swelling.  Workup reassuring with stable creatinine. Serial troponins unchanged. Low suspicion for ACS. AAS without bowel obstruction. CT head negative and patient more alert on recheck. States no CP or SOB. Nausea but no vomiting. C/o ongoing burning in feet. No focal weakness. Distal sensation and perfusion intact. Electrolytes without significant abnormality.  She already takes gabapentin at home. Home BP meds given to ensure BP improving before discharge.  Followup with PCP. Return precautions discussed.   Final Clinical Impressions(s) / ED Diagnoses   Final diagnoses:  Diabetic polyneuropathy associated with diabetes mellitus due to underlying condition [facility])  Hypertensive urgency    ED Discharge Orders    None       Ezequiel Essex, MD 08/27/18 2038

## 2018-08-27 NOTE — Discharge Instructions (Signed)
Take your blood pressure medications as prescribed and keep a record of your blood pressure at home.  Continue your gabapentin for suspected diabetic neuropathy. Return to the ED with chest pain, shortness of breath, any other concerns.

## 2018-08-27 NOTE — ED Notes (Signed)
ED Provider at bedside. 

## 2018-08-28 LAB — METANEPHRINES, URINE, 24 HOUR
Metaneph Total, Ur: 118 ug/L
Metanephrines, 24H Ur: 451 ug/24 hr — ABNORMAL HIGH (ref 36–209)
Normetanephrine, 24H Ur: 1427 ug/24 hr — ABNORMAL HIGH (ref 131–612)
Normetanephrine, Ur: 373 ug/L
Total Volume: 3825

## 2018-08-29 DIAGNOSIS — E039 Hypothyroidism, unspecified: Secondary | ICD-10-CM | POA: Diagnosis not present

## 2018-08-29 DIAGNOSIS — E785 Hyperlipidemia, unspecified: Secondary | ICD-10-CM | POA: Diagnosis not present

## 2018-08-29 DIAGNOSIS — E1165 Type 2 diabetes mellitus with hyperglycemia: Secondary | ICD-10-CM | POA: Diagnosis not present

## 2018-08-29 DIAGNOSIS — E1142 Type 2 diabetes mellitus with diabetic polyneuropathy: Secondary | ICD-10-CM | POA: Diagnosis not present

## 2018-08-29 DIAGNOSIS — D638 Anemia in other chronic diseases classified elsewhere: Secondary | ICD-10-CM | POA: Diagnosis not present

## 2018-08-29 DIAGNOSIS — E1122 Type 2 diabetes mellitus with diabetic chronic kidney disease: Secondary | ICD-10-CM | POA: Diagnosis not present

## 2018-08-29 DIAGNOSIS — I5033 Acute on chronic diastolic (congestive) heart failure: Secondary | ICD-10-CM | POA: Diagnosis not present

## 2018-08-29 DIAGNOSIS — N183 Chronic kidney disease, stage 3 (moderate): Secondary | ICD-10-CM | POA: Diagnosis not present

## 2018-08-29 DIAGNOSIS — I110 Hypertensive heart disease with heart failure: Secondary | ICD-10-CM | POA: Diagnosis not present

## 2018-08-31 ENCOUNTER — Other Ambulatory Visit: Payer: Self-pay

## 2018-08-31 DIAGNOSIS — I10 Essential (primary) hypertension: Secondary | ICD-10-CM | POA: Diagnosis not present

## 2018-08-31 DIAGNOSIS — Z1389 Encounter for screening for other disorder: Secondary | ICD-10-CM | POA: Diagnosis not present

## 2018-08-31 DIAGNOSIS — Z6835 Body mass index (BMI) 35.0-35.9, adult: Secondary | ICD-10-CM | POA: Diagnosis not present

## 2018-08-31 DIAGNOSIS — J9690 Respiratory failure, unspecified, unspecified whether with hypoxia or hypercapnia: Secondary | ICD-10-CM | POA: Diagnosis not present

## 2018-08-31 DIAGNOSIS — Z23 Encounter for immunization: Secondary | ICD-10-CM | POA: Diagnosis not present

## 2018-08-31 DIAGNOSIS — I509 Heart failure, unspecified: Secondary | ICD-10-CM | POA: Diagnosis not present

## 2018-08-31 DIAGNOSIS — E119 Type 2 diabetes mellitus without complications: Secondary | ICD-10-CM | POA: Diagnosis not present

## 2018-08-31 NOTE — Patient Outreach (Signed)
[facility]) Care Management  08/31/2018  Holly Baldwin 03-09-1945 [id number]   Referral received from Inpatient LCSW. Unsuccessful outreach attempt. Per automated prompt, [phone] (home phone) is not a valid number. No answer at [phone] (mobile.)   PLAN -Will send unsuccessful outreach letter. -Will attempt follow-up within 3-4 business days.  [place] [phone]

## 2018-09-07 ENCOUNTER — Other Ambulatory Visit: Payer: Self-pay

## 2018-09-07 NOTE — Patient Outreach (Signed)
[facility]) Care Management  09/07/2018  Holly Baldwin January 23, 1946 [id number]  Successful outreach with Holly Baldwin. HIPAA identifiers verified. She denies worsening symptoms since being evaluated in the ED on 08/27/18. She reports feeling well today.  She reports completing follow-up with her primary care provider last week. Reports having all required medications and taking as prescribed. She admits to not monitoring her fasting blood sugars levels consistently. Denies s/sx of hypoglycemia or hyperglycemia.  Holly Baldwin is agreeable to ongoing [facility] outreach. Denies urgent concerns or immediate care management needs. Will follow-up next week to complete telephonic assessment.   PLAN -Will follow-up next week.

## 2018-09-09 DIAGNOSIS — E785 Hyperlipidemia, unspecified: Secondary | ICD-10-CM | POA: Diagnosis not present

## 2018-09-09 DIAGNOSIS — I5033 Acute on chronic diastolic (congestive) heart failure: Secondary | ICD-10-CM | POA: Diagnosis not present

## 2018-09-09 DIAGNOSIS — D638 Anemia in other chronic diseases classified elsewhere: Secondary | ICD-10-CM | POA: Diagnosis not present

## 2018-09-09 DIAGNOSIS — E1142 Type 2 diabetes mellitus with diabetic polyneuropathy: Secondary | ICD-10-CM | POA: Diagnosis not present

## 2018-09-09 DIAGNOSIS — N183 Chronic kidney disease, stage 3 (moderate): Secondary | ICD-10-CM | POA: Diagnosis not present

## 2018-09-09 DIAGNOSIS — I110 Hypertensive heart disease with heart failure: Secondary | ICD-10-CM | POA: Diagnosis not present

## 2018-09-09 DIAGNOSIS — E039 Hypothyroidism, unspecified: Secondary | ICD-10-CM | POA: Diagnosis not present

## 2018-09-09 DIAGNOSIS — E876 Hypokalemia: Secondary | ICD-10-CM | POA: Diagnosis not present

## 2018-09-09 DIAGNOSIS — Z794 Long term (current) use of insulin: Secondary | ICD-10-CM | POA: Diagnosis not present

## 2018-09-09 DIAGNOSIS — E1165 Type 2 diabetes mellitus with hyperglycemia: Secondary | ICD-10-CM | POA: Diagnosis not present

## 2018-09-09 DIAGNOSIS — E1122 Type 2 diabetes mellitus with diabetic chronic kidney disease: Secondary | ICD-10-CM | POA: Diagnosis not present

## 2018-09-11 DIAGNOSIS — E1165 Type 2 diabetes mellitus with hyperglycemia: Secondary | ICD-10-CM | POA: Diagnosis not present

## 2018-09-11 DIAGNOSIS — E785 Hyperlipidemia, unspecified: Secondary | ICD-10-CM | POA: Diagnosis not present

## 2018-09-11 DIAGNOSIS — E1142 Type 2 diabetes mellitus with diabetic polyneuropathy: Secondary | ICD-10-CM | POA: Diagnosis not present

## 2018-09-11 DIAGNOSIS — N183 Chronic kidney disease, stage 3 (moderate): Secondary | ICD-10-CM | POA: Diagnosis not present

## 2018-09-11 DIAGNOSIS — I110 Hypertensive heart disease with heart failure: Secondary | ICD-10-CM | POA: Diagnosis not present

## 2018-09-11 DIAGNOSIS — E039 Hypothyroidism, unspecified: Secondary | ICD-10-CM | POA: Diagnosis not present

## 2018-09-11 DIAGNOSIS — Z794 Long term (current) use of insulin: Secondary | ICD-10-CM | POA: Diagnosis not present

## 2018-09-11 DIAGNOSIS — I5033 Acute on chronic diastolic (congestive) heart failure: Secondary | ICD-10-CM | POA: Diagnosis not present

## 2018-09-11 DIAGNOSIS — D638 Anemia in other chronic diseases classified elsewhere: Secondary | ICD-10-CM | POA: Diagnosis not present

## 2018-09-11 DIAGNOSIS — E876 Hypokalemia: Secondary | ICD-10-CM | POA: Diagnosis not present

## 2018-09-11 DIAGNOSIS — E1122 Type 2 diabetes mellitus with diabetic chronic kidney disease: Secondary | ICD-10-CM | POA: Diagnosis not present

## 2018-09-15 ENCOUNTER — Other Ambulatory Visit: Payer: Self-pay

## 2018-09-15 DIAGNOSIS — E876 Hypokalemia: Secondary | ICD-10-CM | POA: Diagnosis not present

## 2018-09-15 DIAGNOSIS — I5033 Acute on chronic diastolic (congestive) heart failure: Secondary | ICD-10-CM | POA: Diagnosis not present

## 2018-09-15 DIAGNOSIS — E1122 Type 2 diabetes mellitus with diabetic chronic kidney disease: Secondary | ICD-10-CM | POA: Diagnosis not present

## 2018-09-15 DIAGNOSIS — E039 Hypothyroidism, unspecified: Secondary | ICD-10-CM | POA: Diagnosis not present

## 2018-09-15 DIAGNOSIS — E1142 Type 2 diabetes mellitus with diabetic polyneuropathy: Secondary | ICD-10-CM | POA: Diagnosis not present

## 2018-09-15 DIAGNOSIS — I110 Hypertensive heart disease with heart failure: Secondary | ICD-10-CM | POA: Diagnosis not present

## 2018-09-15 DIAGNOSIS — N183 Chronic kidney disease, stage 3 (moderate): Secondary | ICD-10-CM | POA: Diagnosis not present

## 2018-09-15 DIAGNOSIS — Z794 Long term (current) use of insulin: Secondary | ICD-10-CM | POA: Diagnosis not present

## 2018-09-15 DIAGNOSIS — E1165 Type 2 diabetes mellitus with hyperglycemia: Secondary | ICD-10-CM | POA: Diagnosis not present

## 2018-09-15 DIAGNOSIS — E785 Hyperlipidemia, unspecified: Secondary | ICD-10-CM | POA: Diagnosis not present

## 2018-09-15 DIAGNOSIS — D638 Anemia in other chronic diseases classified elsewhere: Secondary | ICD-10-CM | POA: Diagnosis not present

## 2018-09-17 DIAGNOSIS — E1142 Type 2 diabetes mellitus with diabetic polyneuropathy: Secondary | ICD-10-CM | POA: Diagnosis not present

## 2018-09-17 DIAGNOSIS — I110 Hypertensive heart disease with heart failure: Secondary | ICD-10-CM | POA: Diagnosis not present

## 2018-09-17 DIAGNOSIS — E876 Hypokalemia: Secondary | ICD-10-CM | POA: Diagnosis not present

## 2018-09-17 DIAGNOSIS — D638 Anemia in other chronic diseases classified elsewhere: Secondary | ICD-10-CM | POA: Diagnosis not present

## 2018-09-17 DIAGNOSIS — E1165 Type 2 diabetes mellitus with hyperglycemia: Secondary | ICD-10-CM | POA: Diagnosis not present

## 2018-09-17 DIAGNOSIS — Z794 Long term (current) use of insulin: Secondary | ICD-10-CM | POA: Diagnosis not present

## 2018-09-17 DIAGNOSIS — N183 Chronic kidney disease, stage 3 (moderate): Secondary | ICD-10-CM | POA: Diagnosis not present

## 2018-09-17 DIAGNOSIS — E039 Hypothyroidism, unspecified: Secondary | ICD-10-CM | POA: Diagnosis not present

## 2018-09-17 DIAGNOSIS — I5033 Acute on chronic diastolic (congestive) heart failure: Secondary | ICD-10-CM | POA: Diagnosis not present

## 2018-09-17 DIAGNOSIS — E785 Hyperlipidemia, unspecified: Secondary | ICD-10-CM | POA: Diagnosis not present

## 2018-09-17 DIAGNOSIS — E1122 Type 2 diabetes mellitus with diabetic chronic kidney disease: Secondary | ICD-10-CM | POA: Diagnosis not present

## 2018-09-20 DIAGNOSIS — E1165 Type 2 diabetes mellitus with hyperglycemia: Secondary | ICD-10-CM | POA: Diagnosis not present

## 2018-09-20 DIAGNOSIS — E1122 Type 2 diabetes mellitus with diabetic chronic kidney disease: Secondary | ICD-10-CM | POA: Diagnosis not present

## 2018-09-20 DIAGNOSIS — Z794 Long term (current) use of insulin: Secondary | ICD-10-CM | POA: Diagnosis not present

## 2018-09-20 DIAGNOSIS — D638 Anemia in other chronic diseases classified elsewhere: Secondary | ICD-10-CM | POA: Diagnosis not present

## 2018-09-20 DIAGNOSIS — E039 Hypothyroidism, unspecified: Secondary | ICD-10-CM | POA: Diagnosis not present

## 2018-09-20 DIAGNOSIS — E785 Hyperlipidemia, unspecified: Secondary | ICD-10-CM | POA: Diagnosis not present

## 2018-09-20 DIAGNOSIS — I5033 Acute on chronic diastolic (congestive) heart failure: Secondary | ICD-10-CM | POA: Diagnosis not present

## 2018-09-20 DIAGNOSIS — I110 Hypertensive heart disease with heart failure: Secondary | ICD-10-CM | POA: Diagnosis not present

## 2018-09-20 DIAGNOSIS — E1142 Type 2 diabetes mellitus with diabetic polyneuropathy: Secondary | ICD-10-CM | POA: Diagnosis not present

## 2018-09-20 DIAGNOSIS — E876 Hypokalemia: Secondary | ICD-10-CM | POA: Diagnosis not present

## 2018-09-20 DIAGNOSIS — N183 Chronic kidney disease, stage 3 (moderate): Secondary | ICD-10-CM | POA: Diagnosis not present

## 2018-09-22 DIAGNOSIS — E785 Hyperlipidemia, unspecified: Secondary | ICD-10-CM | POA: Diagnosis not present

## 2018-09-22 DIAGNOSIS — Z794 Long term (current) use of insulin: Secondary | ICD-10-CM | POA: Diagnosis not present

## 2018-09-22 DIAGNOSIS — D638 Anemia in other chronic diseases classified elsewhere: Secondary | ICD-10-CM | POA: Diagnosis not present

## 2018-09-22 DIAGNOSIS — E876 Hypokalemia: Secondary | ICD-10-CM | POA: Diagnosis not present

## 2018-09-22 DIAGNOSIS — N183 Chronic kidney disease, stage 3 (moderate): Secondary | ICD-10-CM | POA: Diagnosis not present

## 2018-09-22 DIAGNOSIS — E1122 Type 2 diabetes mellitus with diabetic chronic kidney disease: Secondary | ICD-10-CM | POA: Diagnosis not present

## 2018-09-22 DIAGNOSIS — I110 Hypertensive heart disease with heart failure: Secondary | ICD-10-CM | POA: Diagnosis not present

## 2018-09-22 DIAGNOSIS — E039 Hypothyroidism, unspecified: Secondary | ICD-10-CM | POA: Diagnosis not present

## 2018-09-22 DIAGNOSIS — E1165 Type 2 diabetes mellitus with hyperglycemia: Secondary | ICD-10-CM | POA: Diagnosis not present

## 2018-09-22 DIAGNOSIS — E1142 Type 2 diabetes mellitus with diabetic polyneuropathy: Secondary | ICD-10-CM | POA: Diagnosis not present

## 2018-09-22 DIAGNOSIS — I5033 Acute on chronic diastolic (congestive) heart failure: Secondary | ICD-10-CM | POA: Diagnosis not present

## 2018-09-22 NOTE — Patient Outreach (Signed)
[facility]) Care Management  Entry Resubmitted for Encounter on 09/15/18.   Holly Baldwin 04/05/1945 [id number]   Ms. Holly Baldwin was scheduled for a telephonic assessment today. Unable to complete assessment as planned.  She denies immediate care management needs. Agreeable to follow-up outreach.  PLAN -Will follow-up next week.   [place] [phone]

## 2018-09-24 ENCOUNTER — Other Ambulatory Visit: Payer: Self-pay

## 2018-09-24 DIAGNOSIS — D638 Anemia in other chronic diseases classified elsewhere: Secondary | ICD-10-CM | POA: Diagnosis not present

## 2018-09-24 DIAGNOSIS — E1165 Type 2 diabetes mellitus with hyperglycemia: Secondary | ICD-10-CM | POA: Diagnosis not present

## 2018-09-24 DIAGNOSIS — Z794 Long term (current) use of insulin: Secondary | ICD-10-CM | POA: Diagnosis not present

## 2018-09-24 DIAGNOSIS — N183 Chronic kidney disease, stage 3 (moderate): Secondary | ICD-10-CM | POA: Diagnosis not present

## 2018-09-24 DIAGNOSIS — E1142 Type 2 diabetes mellitus with diabetic polyneuropathy: Secondary | ICD-10-CM | POA: Diagnosis not present

## 2018-09-24 DIAGNOSIS — I110 Hypertensive heart disease with heart failure: Secondary | ICD-10-CM | POA: Diagnosis not present

## 2018-09-24 DIAGNOSIS — E785 Hyperlipidemia, unspecified: Secondary | ICD-10-CM | POA: Diagnosis not present

## 2018-09-24 DIAGNOSIS — E039 Hypothyroidism, unspecified: Secondary | ICD-10-CM | POA: Diagnosis not present

## 2018-09-24 DIAGNOSIS — I5033 Acute on chronic diastolic (congestive) heart failure: Secondary | ICD-10-CM | POA: Diagnosis not present

## 2018-09-24 DIAGNOSIS — E1122 Type 2 diabetes mellitus with diabetic chronic kidney disease: Secondary | ICD-10-CM | POA: Diagnosis not present

## 2018-09-24 DIAGNOSIS — E876 Hypokalemia: Secondary | ICD-10-CM | POA: Diagnosis not present

## 2018-09-24 NOTE — Patient Outreach (Signed)
[place] [facility]) Care Management  [place]  09/24/2018   Holly Baldwin [birth year] [id number]  Subjective:  Outreach for completion of telephonic assessment.  Objective:  Encounter Medications:  Outpatient Encounter Medications as of 09/24/2018  Medication Sig  . albuterol (PROVENTIL HFA;VENTOLIN HFA) 108 (90 Base) MCG/ACT inhaler Inhale into the lungs every 6 (six) hours as needed for wheezing or shortness of breath.  Marland Kitchen amLODipine (NORVASC) 10 MG tablet Take 1 tablet (10 mg total) by mouth daily.  Marland Kitchen aspirin EC 81 MG tablet Take 81 mg by mouth at bedtime.  . budesonide-formoterol (SYMBICORT) 160-4.5 MCG/ACT inhaler Inhale 2 puffs into the lungs 2 (two) times daily.  . capsaicin (ZOSTRIX) 0.025 % cream Apply topically 2 (two) times daily.  . carvedilol (COREG) 25 MG tablet Take 1 tablet (25 mg total) by mouth 2 (two) times daily with a meal.  . Cholecalciferol (VITAMIN D3) 125 MCG (5000 UT) CAPS TAKE 1 CAPSULE EVERY DAY  . cycloSPORINE (RESTASIS) 0.05 % ophthalmic emulsion Place 1 drop into both eyes daily.  Marland Kitchen donepezil (ARICEPT) 5 MG tablet Take 5 mg by mouth at bedtime.   Marland Kitchen EPINEPHrine (EPI-PEN) 0.3 mg/0.3 mL DEVI Inject 0.3 mg into the muscle once.  Marland Kitchen FLUoxetine (PROZAC) 20 MG capsule Take 20 mg by mouth every morning.   . furosemide (LASIX) 40 MG tablet Take 1 tablet (40 mg total) by mouth daily.  Marland Kitchen gabapentin (NEURONTIN) 100 MG capsule TAKE 1 CAPSULE TWICE DAILY  . glipiZIDE (GLUCOTROL) 5 MG tablet Take 1 tablet (5 mg total) by mouth daily before breakfast.  . hydrALAZINE (APRESOLINE) 100 MG tablet Take 1 tablet (100 mg total) by mouth 3 (three) times daily.  . Insulin Glargine (LANTUS SOLOSTAR) 100 UNIT/ML Solostar Pen Inject 50 Units into the skin at bedtime. INJECT  50 UNITS SUBCUTANEOUSLY  AT  10PM  . isosorbide mononitrate (IMDUR) 60 MG 24 hr tablet Take 1 tablet (60 mg total) by mouth daily.  Marland Kitchen levothyroxine (SYNTHROID, LEVOTHROID) 137 MCG tablet Take 1  tablet (137 mcg total) by mouth daily before breakfast.  . simvastatin (ZOCOR) 40 MG tablet TAKE 1 TABLET EVERY DAY  . topiramate (TOPAMAX) 100 MG tablet Take 1 tablet by mouth 2 (two) times daily.  . ziprasidone (GEODON) 80 MG capsule Take 80 mg by mouth at bedtime.     No facility-administered encounter medications on file as of 09/24/2018.     Functional Status:  In your present state of health, do you have any difficulty performing the following activities: 09/24/2018 08/20/2018  Hearing? N N  Vision? N N  Difficulty concentrating or making decisions? N Y  Walking or climbing stairs? Y Y  Dressing or bathing? N N  Doing errands, shopping? Y N  Preparing Food and eating ? N -  Comment No problems eating. Spouse prepares meals. -  Using the Toilet? N -  In the past six months, have you accidently leaked urine? N -  Do you have problems with loss of bowel control? N -  Managing your Medications? Y -  Comment Spouse prepares medications. -  Managing your Finances? Y -  Comment Spouse assist. -  Housekeeping or managing your Housekeeping? Y -  Comment Spouse assist. -  Some recent data might be hidden    Fall/Depression Screening: Fall Risk  09/24/2018 09/15/2018 12/17/2017  Falls in the past year? 0 0 No  Risk for fall due to : Medication side effect - -  Follow up Falls prevention  discussed Falls prevention discussed -   PHQ 2/9 Scores 09/24/2018 09/15/2018 12/17/2017 09/22/2017 06/23/2017 03/17/2017 08/06/2016  PHQ - 2 Score 0 0 0 0 0 0 0    Assessment:  Telephonic assessment complete. Holly Baldwin reports feeling well over the past week. Denies complaints of shortness of breath, pain or chest discomfort. Reports ambulating well. Denies changes in activity tolerance. No falls reported.  She attempts to take medications as prescribed. Her spouse assists with medication preparation. Denies concerns regarding prescription costs. Declines need for medication adherence packages.  Discussed  care management needs. She performs ADLs independently and reports that her spouse, Holly Baldwin is available in the home to assist as needed. She also receives outreach from a Games developer and a Psychologist, counselling every two weeks. She was unable to recall the name of the agency providing these services. Declines need for transportation or additional assistance in the home. She does express concerns regarding nutrition. She is agreeable to a referral for meal delivery. She is aware that the program is temporary. She does not anticipate a need for long term nutritional assistance. THN CM Care Plan Problem One     Most Recent Value  Care Plan Problem One  Risk for Admission  Role Documenting the Problem One  Care Management [place] for Problem One  Active  THN Long Term Goal   Over the next 90 days, patient will not be hospitalized.  THN Long Term Goal Start Date  09/24/18  Interventions for Problem One Long Term Goal  Discussed medications, nutrition, and treatment recommendations. Discussed BP, FBS and weight parameters. Provided instructions regarding indication for notifying MD.   THN CM Short Term Goal #1   Over the next 30 days, patient will take all medications as prescribed.  THN CM Short Term Goal #1 Start Date  09/24/18  Interventions for Short Term Goal #1  Discussed medications. Discussed available resources for medication adherence. [Pt's spouse is currently preparing the medications.]  THN CM Short Term Goal #2   Over the next 30 days, patient will monitor her BP daily and record readings.  THN CM Short Term Goal #2 Start Date  09/24/18  Interventions for Short Term Goal #2  Discussed currently BP readings and recommended parameters. Encouraged to monitor daily and reports abnormal readings.  [Will mail THN Calendar/Log.]  THN CM Short Term Goal #3  Over the next 30 days, patient will monitor and record daily FBS readings.  THN CM Short Term Goal #3 Start Date  09/24/18   Interventions for Short Tern Goal #3  Discussed s/sx of hypoglycemia and hyperglycemia. Encouraged to monitor FBS daily and maintain a log.   THN CM Short Term Goal #4  Over the next 30 days, patient will weigh daily.  THN CM Short Term Goal #4 Start Date  09/24/18  Interventions for Short Term Goal #4  Provided education regarding CHF weight parameters. Provided instructions regarding monitoring and recording morning weights.  THN CM Short Term Goal #5   Over the next 30 days, patient will follow recommendations for home safety and fall prevention.  THN CM Short Term Goal #5 Start Date  09/24/18  Interventions for Short Term Goal #5  Discussed home safety and fall prevention measures.      PLAN -Will contact [place] [facility] regarding the meal delivery program. -Will mail information for available community resources. -Will follow-up with Holly Baldwin next week.   [place] [phone]

## 2018-09-27 ENCOUNTER — Other Ambulatory Visit: Payer: Self-pay

## 2018-09-27 DIAGNOSIS — N183 Chronic kidney disease, stage 3 (moderate): Secondary | ICD-10-CM | POA: Diagnosis not present

## 2018-09-27 DIAGNOSIS — E1165 Type 2 diabetes mellitus with hyperglycemia: Secondary | ICD-10-CM | POA: Diagnosis not present

## 2018-09-27 DIAGNOSIS — D638 Anemia in other chronic diseases classified elsewhere: Secondary | ICD-10-CM | POA: Diagnosis not present

## 2018-09-27 DIAGNOSIS — E1122 Type 2 diabetes mellitus with diabetic chronic kidney disease: Secondary | ICD-10-CM | POA: Diagnosis not present

## 2018-09-27 DIAGNOSIS — I110 Hypertensive heart disease with heart failure: Secondary | ICD-10-CM | POA: Diagnosis not present

## 2018-09-27 DIAGNOSIS — Z794 Long term (current) use of insulin: Secondary | ICD-10-CM | POA: Diagnosis not present

## 2018-09-27 DIAGNOSIS — I5033 Acute on chronic diastolic (congestive) heart failure: Secondary | ICD-10-CM | POA: Diagnosis not present

## 2018-09-27 DIAGNOSIS — E039 Hypothyroidism, unspecified: Secondary | ICD-10-CM | POA: Diagnosis not present

## 2018-09-27 DIAGNOSIS — E876 Hypokalemia: Secondary | ICD-10-CM | POA: Diagnosis not present

## 2018-09-27 DIAGNOSIS — E785 Hyperlipidemia, unspecified: Secondary | ICD-10-CM | POA: Diagnosis not present

## 2018-09-27 DIAGNOSIS — E1142 Type 2 diabetes mellitus with diabetic polyneuropathy: Secondary | ICD-10-CM | POA: Diagnosis not present

## 2018-09-27 NOTE — Patient Outreach (Addendum)
Holly Baldwin) Care Management  09/27/2018  Holly Baldwin Jul 02, 1945 [id number]    Request received from [facility], [name], to link patient to [place] which will allow her to receive delivered meals for the next four weeks.  Referral submitted.  Attempted to contact patient to confirm first delivery date of 09/30/18 but was unable to get through or leave a message.  In-basket message sent to [facility].    Ronn Melena, BSW Social Worker [phone]

## 2018-09-29 ENCOUNTER — Other Ambulatory Visit: Payer: Self-pay

## 2018-09-29 DIAGNOSIS — I5033 Acute on chronic diastolic (congestive) heart failure: Secondary | ICD-10-CM | POA: Diagnosis not present

## 2018-09-29 DIAGNOSIS — E1165 Type 2 diabetes mellitus with hyperglycemia: Secondary | ICD-10-CM | POA: Diagnosis not present

## 2018-09-29 DIAGNOSIS — E876 Hypokalemia: Secondary | ICD-10-CM | POA: Diagnosis not present

## 2018-09-29 DIAGNOSIS — I110 Hypertensive heart disease with heart failure: Secondary | ICD-10-CM | POA: Diagnosis not present

## 2018-09-29 DIAGNOSIS — E1122 Type 2 diabetes mellitus with diabetic chronic kidney disease: Secondary | ICD-10-CM | POA: Diagnosis not present

## 2018-09-29 DIAGNOSIS — N183 Chronic kidney disease, stage 3 (moderate): Secondary | ICD-10-CM | POA: Diagnosis not present

## 2018-09-29 DIAGNOSIS — E1142 Type 2 diabetes mellitus with diabetic polyneuropathy: Secondary | ICD-10-CM | POA: Diagnosis not present

## 2018-09-29 DIAGNOSIS — D638 Anemia in other chronic diseases classified elsewhere: Secondary | ICD-10-CM | POA: Diagnosis not present

## 2018-09-29 DIAGNOSIS — Z794 Long term (current) use of insulin: Secondary | ICD-10-CM | POA: Diagnosis not present

## 2018-09-29 DIAGNOSIS — E785 Hyperlipidemia, unspecified: Secondary | ICD-10-CM | POA: Diagnosis not present

## 2018-09-29 DIAGNOSIS — E039 Hypothyroidism, unspecified: Secondary | ICD-10-CM | POA: Diagnosis not present

## 2018-09-29 NOTE — Patient Outreach (Signed)
[place] [facility]) Care Management  09/29/2018  Holly Baldwin June 12, 1945 [id number]  Follow-up outreach with Holly Baldwin. She denies immediate needs but has concerns regarding her medications. Her spouse prepares her medications and she is concerned that they are not being prepared properly.  Reports daily weights, FBS levels and blood pressure reading have been within range.  Called placed to [facility]. Dr. Hilma Favors will evaluate her on 10/04/18 to review her medications and determine if dose adjustments are needed.  Holly Baldwin is aware and agreeable to this plan.  Will follow-up on next week to discuss possible transition to medication adherence packages.  Current Outpatient Medications on File Prior to Visit  Medication Sig Dispense Refill  . albuterol (PROVENTIL HFA;VENTOLIN HFA) 108 (90 Base) MCG/ACT inhaler Inhale into the lungs every 6 (six) hours as needed for wheezing or shortness of breath.    Marland Kitchen amLODipine (NORVASC) 10 MG tablet Take 1 tablet (10 mg total) by mouth daily. 30 tablet 3  . aspirin EC 81 MG tablet Take 81 mg by mouth at bedtime.    . budesonide-formoterol (SYMBICORT) 160-4.5 MCG/ACT inhaler Inhale 2 puffs into the lungs 2 (two) times daily.    . capsaicin (ZOSTRIX) 0.025 % cream Apply topically 2 (two) times daily.    . carvedilol (COREG) 25 MG tablet Take 1 tablet (25 mg total) by mouth 2 (two) times daily with a meal. 60 tablet 3  . Cholecalciferol (VITAMIN D3) 125 MCG (5000 UT) CAPS TAKE 1 CAPSULE EVERY DAY 90 capsule 0  . cycloSPORINE (RESTASIS) 0.05 % ophthalmic emulsion Place 1 drop into both eyes daily.    Marland Kitchen donepezil (ARICEPT) 5 MG tablet Take 5 mg by mouth at bedtime.     Marland Kitchen EPINEPHrine (EPI-PEN) 0.3 mg/0.3 mL DEVI Inject 0.3 mg into the muscle once.    Marland Kitchen FLUoxetine (PROZAC) 20 MG capsule Take 20 mg by mouth every morning.     . furosemide (LASIX) 40 MG tablet Take 1 tablet (40 mg total) by mouth daily. 30 tablet 3  . gabapentin (NEURONTIN) 100 MG  capsule TAKE 1 CAPSULE TWICE DAILY 180 capsule 0  . glipiZIDE (GLUCOTROL) 5 MG tablet Take 1 tablet (5 mg total) by mouth daily before breakfast. 30 tablet 3  . hydrALAZINE (APRESOLINE) 100 MG tablet Take 1 tablet (100 mg total) by mouth 3 (three) times daily. 90 tablet 3  . Insulin Glargine (LANTUS SOLOSTAR) 100 UNIT/ML Solostar Pen Inject 50 Units into the skin at bedtime. INJECT  50 UNITS SUBCUTANEOUSLY  AT  10PM 15 pen 2  . isosorbide mononitrate (IMDUR) 60 MG 24 hr tablet Take 1 tablet (60 mg total) by mouth daily. 30 tablet 3  . levothyroxine (SYNTHROID, LEVOTHROID) 137 MCG tablet Take 1 tablet (137 mcg total) by mouth daily before breakfast. 90 tablet 0  . simvastatin (ZOCOR) 40 MG tablet TAKE 1 TABLET EVERY DAY 90 tablet 1  . topiramate (TOPAMAX) 100 MG tablet Take 1 tablet by mouth 2 (two) times daily.    . ziprasidone (GEODON) 80 MG capsule Take 80 mg by mouth at bedtime.       No current facility-administered medications on file prior to visit.    PLAN -Informed Holly Baldwin of pending meal deliveries on 09/30/18. -Will follow-up next week.   [place] [phone]

## 2018-09-30 DIAGNOSIS — I110 Hypertensive heart disease with heart failure: Secondary | ICD-10-CM | POA: Diagnosis not present

## 2018-09-30 DIAGNOSIS — N183 Chronic kidney disease, stage 3 (moderate): Secondary | ICD-10-CM | POA: Diagnosis not present

## 2018-09-30 DIAGNOSIS — E1122 Type 2 diabetes mellitus with diabetic chronic kidney disease: Secondary | ICD-10-CM | POA: Diagnosis not present

## 2018-09-30 DIAGNOSIS — Z794 Long term (current) use of insulin: Secondary | ICD-10-CM | POA: Diagnosis not present

## 2018-09-30 DIAGNOSIS — D638 Anemia in other chronic diseases classified elsewhere: Secondary | ICD-10-CM | POA: Diagnosis not present

## 2018-09-30 DIAGNOSIS — E1142 Type 2 diabetes mellitus with diabetic polyneuropathy: Secondary | ICD-10-CM | POA: Diagnosis not present

## 2018-09-30 DIAGNOSIS — E039 Hypothyroidism, unspecified: Secondary | ICD-10-CM | POA: Diagnosis not present

## 2018-09-30 DIAGNOSIS — E785 Hyperlipidemia, unspecified: Secondary | ICD-10-CM | POA: Diagnosis not present

## 2018-09-30 DIAGNOSIS — E876 Hypokalemia: Secondary | ICD-10-CM | POA: Diagnosis not present

## 2018-09-30 DIAGNOSIS — I5033 Acute on chronic diastolic (congestive) heart failure: Secondary | ICD-10-CM | POA: Diagnosis not present

## 2018-09-30 DIAGNOSIS — E1165 Type 2 diabetes mellitus with hyperglycemia: Secondary | ICD-10-CM | POA: Diagnosis not present

## 2018-10-04 DIAGNOSIS — E039 Hypothyroidism, unspecified: Secondary | ICD-10-CM | POA: Diagnosis not present

## 2018-10-04 DIAGNOSIS — Z1389 Encounter for screening for other disorder: Secondary | ICD-10-CM | POA: Diagnosis not present

## 2018-10-06 ENCOUNTER — Other Ambulatory Visit: Payer: Self-pay

## 2018-10-06 NOTE — Patient Outreach (Signed)
[place] [facility]) Care Management  10/06/2018  Holly Baldwin 1945-04-24 [id number]    Attempted follow-up outreach with Ms. Gouge to discuss recent MD visit. Per phone prompt, voicemail is not set up to receive messages.    PLAN -Will follow-up within a week.   [place] [phone]

## 2018-10-07 DIAGNOSIS — E876 Hypokalemia: Secondary | ICD-10-CM | POA: Diagnosis not present

## 2018-10-07 DIAGNOSIS — N183 Chronic kidney disease, stage 3 (moderate): Secondary | ICD-10-CM | POA: Diagnosis not present

## 2018-10-07 DIAGNOSIS — E785 Hyperlipidemia, unspecified: Secondary | ICD-10-CM | POA: Diagnosis not present

## 2018-10-07 DIAGNOSIS — D638 Anemia in other chronic diseases classified elsewhere: Secondary | ICD-10-CM | POA: Diagnosis not present

## 2018-10-07 DIAGNOSIS — Z794 Long term (current) use of insulin: Secondary | ICD-10-CM | POA: Diagnosis not present

## 2018-10-07 DIAGNOSIS — E1165 Type 2 diabetes mellitus with hyperglycemia: Secondary | ICD-10-CM | POA: Diagnosis not present

## 2018-10-07 DIAGNOSIS — E1142 Type 2 diabetes mellitus with diabetic polyneuropathy: Secondary | ICD-10-CM | POA: Diagnosis not present

## 2018-10-07 DIAGNOSIS — I5033 Acute on chronic diastolic (congestive) heart failure: Secondary | ICD-10-CM | POA: Diagnosis not present

## 2018-10-07 DIAGNOSIS — E039 Hypothyroidism, unspecified: Secondary | ICD-10-CM | POA: Diagnosis not present

## 2018-10-07 DIAGNOSIS — I110 Hypertensive heart disease with heart failure: Secondary | ICD-10-CM | POA: Diagnosis not present

## 2018-10-07 DIAGNOSIS — E1122 Type 2 diabetes mellitus with diabetic chronic kidney disease: Secondary | ICD-10-CM | POA: Diagnosis not present

## 2018-10-12 ENCOUNTER — Other Ambulatory Visit: Payer: Self-pay

## 2018-10-12 NOTE — Patient Outreach (Signed)
[place] [facility]) Care Management  10/12/2018  Holly Baldwin Jul 10, 1945 [id number]    Current Outpatient Medications on File Prior to Visit  Medication Sig Dispense Refill  . albuterol (PROVENTIL HFA;VENTOLIN HFA) 108 (90 Base) MCG/ACT inhaler Inhale into the lungs every 6 (six) hours as needed for wheezing or shortness of breath.    Marland Kitchen amLODipine (NORVASC) 10 MG tablet Take 1 tablet (10 mg total) by mouth daily. 30 tablet 3  . aspirin EC 81 MG tablet Take 81 mg by mouth at bedtime.    . budesonide-formoterol (SYMBICORT) 160-4.5 MCG/ACT inhaler Inhale 2 puffs into the lungs 2 (two) times daily.    . capsaicin (ZOSTRIX) 0.025 % cream Apply topically 2 (two) times daily.    . carvedilol (COREG) 25 MG tablet Take 1 tablet (25 mg total) by mouth 2 (two) times daily with a meal. 60 tablet 3  . Cholecalciferol (VITAMIN D3) 125 MCG (5000 UT) CAPS TAKE 1 CAPSULE EVERY DAY 90 capsule 0  . cycloSPORINE (RESTASIS) 0.05 % ophthalmic emulsion Place 1 drop into both eyes daily.    Marland Kitchen donepezil (ARICEPT) 5 MG tablet Take 5 mg by mouth at bedtime.     Marland Kitchen EPINEPHrine (EPI-PEN) 0.3 mg/0.3 mL DEVI Inject 0.3 mg into the muscle once.    Marland Kitchen FLUoxetine (PROZAC) 20 MG capsule Take 20 mg by mouth every morning.     . furosemide (LASIX) 40 MG tablet Take 1 tablet (40 mg total) by mouth daily. 30 tablet 3  . gabapentin (NEURONTIN) 100 MG capsule TAKE 1 CAPSULE TWICE DAILY 180 capsule 0  . glipiZIDE (GLUCOTROL) 5 MG tablet Take 1 tablet (5 mg total) by mouth daily before breakfast. (Patient not taking: Reported on 10/12/2018) 30 tablet 3  . hydrALAZINE (APRESOLINE) 100 MG tablet Take 1 tablet (100 mg total) by mouth 3 (three) times daily. 90 tablet 3  . Insulin Glargine (LANTUS SOLOSTAR) 100 UNIT/ML Solostar Pen Inject 50 Units into the skin at bedtime. INJECT  50 UNITS SUBCUTANEOUSLY  AT  10PM 15 pen 2  . isosorbide mononitrate (IMDUR) 60 MG 24 hr tablet Take 1 tablet (60 mg total) by mouth daily. 30 tablet 3   . levothyroxine (SYNTHROID, LEVOTHROID) 137 MCG tablet Take 1 tablet (137 mcg total) by mouth daily before breakfast. 90 tablet 0  . simvastatin (ZOCOR) 40 MG tablet TAKE 1 TABLET EVERY DAY 90 tablet 1  . topiramate (TOPAMAX) 100 MG tablet Take 1 tablet by mouth 2 (two) times daily.    . ziprasidone (GEODON) 80 MG capsule Take 80 mg by mouth at bedtime.       No current facility-administered medications on file prior to visit.       THN CM Care Plan Problem One     Most Recent Value  Care Plan Problem One  Risk for Admission  Role Documenting the Problem One  Care Management [place] for Problem One  Active  THN Long Term Goal   Over the next 90 days, patient will not be hospitalized.  THN Long Term Goal Start Date  09/24/18  Interventions for Problem One Long Term Goal  Reviewed recent medication changes, weight and blood sugar ranges.   THN CM Short Term Goal #1   Over the next 30 days, patient will take all medications as prescribed.  THN CM Short Term Goal #1 Start Date  09/24/18  Interventions for Short Term Goal #1  Reviewed recent medications changes. Patient declines current need for adherence  packages but will update RNCM if her spouse is not available to assist with medication preparation. [Medication changes following PCP visit on 10/04/18.]  THN CM Short Term Goal #2   Over the next 30 days, patient will monitor her BP daily and record readings.  THN CM Short Term Goal #2 Start Date  09/24/18  Interventions for Short Term Goal #2  Encouraged to monitor as recommended and log readings.  [Reports not monitoring BP on 10/12/18.]  THN CM Short Term Goal #3  Over the next 30 days, patient will monitor and record daily FBS readings.  THN CM Short Term Goal #3 Start Date  09/24/18  Interventions for Short Tern Goal #3  Reviewed FBS ranges. Discussed parameters and indications for notifying MD.  [Reports reading of 153mg /dl.]  THN CM Short Term Goal #4  Over the next 30  days, patient will weigh daily.  THN CM Short Term Goal #4 Start Date  09/24/18  Interventions for Short Term Goal #4  Discussed weight parameters. Encouraged to maintain log and reports weight gain greater than 3lbs overnight and 5lbs within a week. [Reports weight of 208lbs.]  THN CM Short Term Goal #5   Over the next 30 days, patient will follow recommendations for home safety and fall prevention.  THN CM Short Term Goal #5 Start Date  09/24/18  Interventions for Short Term Goal #5  Reinforced instructions regarding home safety and fall prevention. Discussed medication side effects. Patient encouraged to practice extra precautions when ambulating in the home after taking evening medications.      PLAN -Will follow-up later this month.   [place] [phone]

## 2018-10-19 ENCOUNTER — Ambulatory Visit (INDEPENDENT_AMBULATORY_CARE_PROVIDER_SITE_OTHER): Payer: Medicare Other | Admitting: "Endocrinology

## 2018-10-19 ENCOUNTER — Encounter: Payer: Self-pay | Admitting: "Endocrinology

## 2018-10-19 ENCOUNTER — Other Ambulatory Visit: Payer: Self-pay

## 2018-10-19 DIAGNOSIS — E039 Hypothyroidism, unspecified: Secondary | ICD-10-CM | POA: Diagnosis not present

## 2018-10-19 DIAGNOSIS — IMO0002 Reserved for concepts with insufficient information to code with codable children: Secondary | ICD-10-CM

## 2018-10-19 DIAGNOSIS — I10 Essential (primary) hypertension: Secondary | ICD-10-CM | POA: Diagnosis not present

## 2018-10-19 DIAGNOSIS — E782 Mixed hyperlipidemia: Secondary | ICD-10-CM | POA: Diagnosis not present

## 2018-10-19 DIAGNOSIS — E118 Type 2 diabetes mellitus with unspecified complications: Secondary | ICD-10-CM

## 2018-10-19 DIAGNOSIS — E1165 Type 2 diabetes mellitus with hyperglycemia: Secondary | ICD-10-CM

## 2018-10-19 DIAGNOSIS — Z794 Long term (current) use of insulin: Secondary | ICD-10-CM

## 2018-10-19 NOTE — Progress Notes (Signed)
10/19/2018                                                    Endocrinology Telehealth Visit Follow up Note -During COVID -19 Pandemic  This visit type was conducted due to national recommendations for restrictions regarding the COVID-19 Pandemic  in an effort to limit this patient's exposure and mitigate transmission of the corona virus.  Due to her co-morbid illnesses, Holly Baldwin is at  moderate to high risk for complications without adequate follow up.  This format is felt to be most appropriate for her at this time.  I connected with this patient on 10/19/2018   by telephone and verified that I am speaking with the correct person using two identifiers. Holly Baldwin, 11/11/1945. she has verbally consented to this visit. All issues noted in this document were discussed and addressed. The format was not optimal for physical exam.     Subjective:    Patient ID: Holly Baldwin, female    DOB: 16-Aug-1945,    Past Medical History:  Diagnosis Date  . Anxiety   . Chronic abdominal pain   . Diabetes mellitus   . GERD (gastroesophageal reflux disease)   . Hiatal hernia   . Hypercholesteremia   . Hypertension    Past Surgical History:  Procedure Laterality Date  . ABDOMINAL HYSTERECTOMY    . COLONOSCOPY N/A 06/23/2012   Procedure: COLONOSCOPY;  Surgeon: Rogene Houston, MD;  Location: [facility];  Service: Endoscopy;  Laterality: N/A;  100-moved to 1200 Ann to notify pt  . ESOPHAGOGASTRODUODENOSCOPY N/A 05/06/2012   Procedure: ESOPHAGOGASTRODUODENOSCOPY (EGD);  Surgeon: Rogene Houston, MD;  Location: [facility];  Service: Endoscopy;  Laterality: N/A;  . MASS EXCISION Right 05/26/2012   Procedure: EXCISION NEOPLASM RIGHT THIGH;  Surgeon: Jamesetta So, MD;  Location: [facility];  Service: General;  Laterality: Right;   Social History   Socioeconomic History  . Marital status: Married    Spouse name: Not on file  . Number of children: Not on file  . Years of education: Not on file  .  Highest education level: Not on file  Occupational History  . Not on file  Social Needs  . Financial resource strain: Not on file  . Food insecurity    Worry: Sometimes true    Inability: Sometimes true  . Transportation needs    Medical: No    Non-medical: No  Tobacco Use  . Smoking status: Never Smoker  . Smokeless tobacco: Never Used  Substance and Sexual Activity  . Alcohol use: No  . Drug use: No  . Sexual activity: Not on file  Lifestyle  . Physical activity    Days per week: Not on file    Minutes per session: Not on file  . Stress: Not on file  Relationships  . Social Herbalist on phone: Not on file    Gets together: Not on file    Attends religious service: Not on file    Active member of club or organization: Not on file    Attends meetings of clubs or organizations: Not on file    Relationship status: Not on file  Other Topics Concern  . Not on file  Social History Narrative  . Not on file   Outpatient  Encounter Medications as of 10/19/2018  Medication Sig  . albuterol (PROVENTIL HFA;VENTOLIN HFA) 108 (90 Base) MCG/ACT inhaler Inhale into the lungs every 6 (six) hours as needed for wheezing or shortness of breath.  Marland Kitchen amLODipine (NORVASC) 10 MG tablet Take 1 tablet (10 mg total) by mouth daily.  Marland Kitchen aspirin EC 81 MG tablet Take 81 mg by mouth at bedtime.  . budesonide-formoterol (SYMBICORT) 160-4.5 MCG/ACT inhaler Inhale 2 puffs into the lungs 2 (two) times daily.  . capsaicin (ZOSTRIX) 0.025 % cream Apply topically 2 (two) times daily.  . carvedilol (COREG) 25 MG tablet Take 1 tablet (25 mg total) by mouth 2 (two) times daily with a meal.  . Cholecalciferol (VITAMIN D3) 125 MCG (5000 UT) CAPS TAKE 1 CAPSULE EVERY DAY  . cycloSPORINE (RESTASIS) 0.05 % ophthalmic emulsion Place 1 drop into both eyes daily.  Marland Kitchen donepezil (ARICEPT) 5 MG tablet Take 5 mg by mouth at bedtime.   Marland Kitchen EPINEPHrine (EPI-PEN) 0.3 mg/0.3 mL DEVI Inject 0.3 mg into the muscle once.   Marland Kitchen FLUoxetine (PROZAC) 20 MG capsule Take 20 mg by mouth every morning.   . furosemide (LASIX) 40 MG tablet Take 1 tablet (40 mg total) by mouth daily.  Marland Kitchen gabapentin (NEURONTIN) 100 MG capsule TAKE 1 CAPSULE TWICE DAILY  . hydrALAZINE (APRESOLINE) 100 MG tablet Take 1 tablet (100 mg total) by mouth 3 (three) times daily.  . Insulin Glargine (LANTUS SOLOSTAR) 100 UNIT/ML Solostar Pen Inject 50 Units into the skin at bedtime. INJECT  50 UNITS SUBCUTANEOUSLY  AT  10PM  . isosorbide mononitrate (IMDUR) 60 MG 24 hr tablet Take 1 tablet (60 mg total) by mouth daily.  Marland Kitchen levothyroxine (SYNTHROID, LEVOTHROID) 137 MCG tablet Take 1 tablet (137 mcg total) by mouth daily before breakfast.  . simvastatin (ZOCOR) 40 MG tablet TAKE 1 TABLET EVERY DAY  . topiramate (TOPAMAX) 100 MG tablet Take 1 tablet by mouth 2 (two) times daily.  . ziprasidone (GEODON) 80 MG capsule Take 80 mg by mouth at bedtime.    . [DISCONTINUED] glipiZIDE (GLUCOTROL) 5 MG tablet Take 1 tablet (5 mg total) by mouth daily before breakfast. (Patient not taking: Reported on 10/12/2018)   No facility-administered encounter medications on file as of 10/19/2018.    ALLERGIES: Allergies  Allergen Reactions  . Bee Venom Anaphylaxis  . Ace Inhibitors   . Penicillins Hives    .Has patient had a PCN reaction causing immediate rash, facial/tongue/throat swelling, SOB or lightheadedness with hypotension: Yes Has patient had a PCN reaction causing severe rash involving mucus membranes or skin necrosis: No Has patient had a PCN reaction that required hospitalization: Yes Has patient had a PCN reaction occurring within the last 10 years: No If all of the above answers are "NO", then may proceed with Cephalosporin use.    VACCINATION STATUS:  There is no immunization history on file for this patient.  Diabetes She presents for her follow-up diabetic visit. She has type 2 diabetes mellitus. Onset time: she was diagnosed at approximate age of 10  years. Her disease course has been stable. There are no hypoglycemic associated symptoms. Pertinent negatives for hypoglycemia include no confusion, headaches, pallor or seizures. Pertinent negatives for diabetes include no chest pain, no fatigue, no polydipsia, no polyphagia and no polyuria. There are no hypoglycemic complications. Symptoms are stable. Diabetic complications include autonomic neuropathy. Risk factors for coronary artery disease include diabetes mellitus, dyslipidemia, obesity and sedentary lifestyle. She is following a generally unhealthy diet. She has not  had a previous visit with a dietitian. She never participates in exercise. Her home blood glucose trend is decreasing steadily. (Due to her visual impairment, patient needed assistance to read her logs to me over the phone, her husband was not there to help her.  Her previsit labs show A1c of 7.1%.) An ACE inhibitor/angiotensin II receptor blocker is being taken.  Thyroid Problem Presents for follow-up (She is currently on levothyroxine 137 mcg p.o. nightly.  She reports compliance with medication.) visit. Patient reports no cold intolerance, diarrhea, fatigue, heat intolerance or palpitations. The symptoms have been stable. Past treatments include levothyroxine. Her past medical history is significant for diabetes and hyperlipidemia.  Hyperlipidemia This is a chronic problem. The current episode started more than 1 year ago. Exacerbating diseases include diabetes and obesity. Pertinent negatives include no chest pain, myalgias or shortness of breath. Current antihyperlipidemic treatment includes statins. Risk factors for coronary artery disease include dyslipidemia, diabetes mellitus, hypertension, obesity, a sedentary lifestyle and post-menopausal.  Hypertension This is a chronic problem. The current episode started more than 1 year ago. The problem is uncontrolled (Patient's pressure this morning was extremely high reading 243/81 and  repeat measurement was 241/72.  Patient is asymptomatic.  Patient was sent with a note to emergency room for evaluation and treatment.). Pertinent negatives include no chest pain, headaches, palpitations or shortness of breath. Risk factors for coronary artery disease include dyslipidemia, diabetes mellitus, obesity, post-menopausal state and sedentary lifestyle. Past treatments include angiotensin blockers, calcium channel blockers and central alpha agonists. Identifiable causes of hypertension include a thyroid problem.     Review of Systems  Constitutional: Negative for fatigue and unexpected weight change.  HENT: Negative for trouble swallowing and voice change.   Eyes: Negative for visual disturbance.  Respiratory: Negative for cough, shortness of breath and wheezing.   Cardiovascular: Negative for chest pain, palpitations and leg swelling.  Gastrointestinal: Negative for diarrhea, nausea and vomiting.  Endocrine: Negative for cold intolerance, heat intolerance, polydipsia, polyphagia and polyuria.  Musculoskeletal: Negative for arthralgias and myalgias.  Skin: Negative for color change, pallor, rash and wound.  Neurological: Negative for seizures and headaches.  Psychiatric/Behavioral: Negative for confusion and suicidal ideas.    Objective:    There were no vitals taken for this visit.  Wt Readings from Last 3 Encounters:  08/26/18 200 lb (90.7 kg)  08/25/18 220 lb 0.3 oz (99.8 kg)  05/05/18 215 lb 12.8 oz (97.9 kg)      Results for orders placed or performed during the hospital encounter of 08/26/18  CBC with Differential/Platelet  Result Value Ref Range   WBC 9.2 4.0 - 10.5 K/uL   RBC 3.63 (L) 3.87 - 5.11 MIL/uL   Hemoglobin 9.5 (L) 12.0 - 15.0 g/dL   HCT 31.9 (L) 36.0 - 46.0 %   MCV 87.9 80.0 - 100.0 fL   MCH 26.2 26.0 - 34.0 pg   MCHC 29.8 (L) 30.0 - 36.0 g/dL   RDW 15.2 11.5 - 15.5 %   Platelets 344 150 - 400 K/uL   nRBC 0.0 0.0 - 0.2 %   Neutrophils Relative % 67  %   Neutro Abs 6.2 1.7 - 7.7 K/uL   Lymphocytes Relative 18 %   Lymphs Abs 1.6 0.7 - 4.0 K/uL   Monocytes Relative 12 %   Monocytes Absolute 1.1 (H) 0.1 - 1.0 K/uL   Eosinophils Relative 2 %   Eosinophils Absolute 0.2 0.0 - 0.5 K/uL   Basophils Relative 1 %  Basophils Absolute 0.1 0.0 - 0.1 K/uL   Immature Granulocytes 0 %   Abs Immature Granulocytes 0.04 0.00 - 0.07 K/uL  Comprehensive metabolic panel  Result Value Ref Range   Sodium 140 135 - 145 mmol/L   Potassium 4.1 3.5 - 5.1 mmol/L   Chloride 104 98 - 111 mmol/L   CO2 26 22 - 32 mmol/L   Glucose, Bld 103 (H) 70 - 99 mg/dL   BUN 38 (H) 8 - 23 mg/dL   Creatinine, Ser 2.39 (H) 0.44 - 1.00 mg/dL   Calcium 8.4 (L) 8.9 - 10.3 mg/dL   Total Protein 5.9 (L) 6.5 - 8.1 g/dL   Albumin 2.8 (L) 3.5 - 5.0 g/dL   AST 38 15 - 41 U/L   ALT 50 (H) 0 - 44 U/L   Alkaline Phosphatase 103 38 - 126 U/L   Total Bilirubin 0.4 0.3 - 1.2 mg/dL   GFR calc non Af Amer 19 (L) >60 mL/min   GFR calc Af Amer 22 (L) >60 mL/min   Anion gap 10 5 - 15  Lipase, blood  Result Value Ref Range   Lipase 26 11 - 51 U/L  Urinalysis, Routine w reflex microscopic  Result Value Ref Range   Color, Urine STRAW (A) YELLOW   APPearance CLEAR CLEAR   Specific Gravity, Urine 1.008 1.005 - 1.030   pH 7.0 5.0 - 8.0   Glucose, UA 50 (A) NEGATIVE mg/dL   Hgb urine dipstick NEGATIVE NEGATIVE   Bilirubin Urine NEGATIVE NEGATIVE   Ketones, ur NEGATIVE NEGATIVE mg/dL   Protein, ur 100 (A) NEGATIVE mg/dL   Nitrite NEGATIVE NEGATIVE   Leukocytes,Ua NEGATIVE NEGATIVE   RBC / HPF 0-5 0 - 5 RBC/hpf   WBC, UA 0-5 0 - 5 WBC/hpf   Bacteria, UA NONE SEEN NONE SEEN   Squamous Epithelial / LPF 0-5 0 - 5   Mucus PRESENT   Troponin I (High Sensitivity)  Result Value Ref Range   Troponin I (High Sensitivity) 18.00 (H) <18 ng/L  Troponin I (High Sensitivity)  Result Value Ref Range   Troponin I (High Sensitivity) 18.00 (H) <18 ng/L  Troponin I (High Sensitivity)  Result Value  Ref Range   Troponin I (High Sensitivity) 18.00 (H) <18 ng/L   Diabetic Labs (most recent): Lab Results  Component Value Date   HGBA1C 7.1 (H) 08/21/2018   HGBA1C 6.3 (H) 04/28/2018   HGBA1C 7.3 (H) 12/14/2017   Lipid Panel     Component Value Date/Time   CHOL 162 08/21/2018 0450   TRIG 103 08/21/2018 0450   HDL 72 08/21/2018 0450   CHOLHDL 2.3 08/21/2018 0450   VLDL 21 08/21/2018 0450   LDLCALC 69 08/21/2018 0450   LDLCALC 105 (H) 12/14/2017 0921     Assessment & Plan:   1. Uncontrolled type 2 diabetes mellitus with complication Her diabetes is  complicated by CAD,  neuropathy , microalbuminuria, worsening CKD,  and patient remains at a high risk for more acute and chronic complications of diabetes which include CAD, CVA, CKD, retinopathy, and neuropathy. These are all discussed in detail with the patient.  She could not report her readings, her previous A1c was 7.1%.    - Recent labs reviewed.   - I have re-counseled the patient on diet management and weight loss  by adopting a carbohydrate restricted / protein rich  Diet.  - she  admits there is a room for improvement in her diet and drink choices. -  Suggestion  is made for her to avoid simple carbohydrates  from her diet including Cakes, Sweet Desserts / Pastries, Ice Cream, Soda (diet and regular), Sweet Tea, Candies, Chips, Cookies, Sweet Pastries,  Store Bought Juices, Alcohol in Excess of  1-2 drinks a day, Artificial Sweeteners, Coffee Creamer, and "Sugar-free" Products. This will help patient to have stable blood glucose profile and potentially avoid unintended weight gain.   - Patient is advised to stick to a routine mealtimes to eat 3 meals  a day and avoid unnecessary snacks ( to snack only to correct hypoglycemia).  - I have approached patient with the following individualized plan to manage diabetes and patient agrees.  -She presents with better engagement for monitoring and documentation of her insulin  activities.  -She is advised to continue Lantus at 50 units nightly, patient is not sure she is taking NovoLog with her meals, she was advised to continue 10 units of NovoLog 3 times a day for pre-meal blood glucose readings above 90 mg per DL.   She is advised to continue monitoring blood glucose 4 times a day-before meals and at bedtime and will be called again in 10 days.  She is advised to have her husband with her during her next visit.  -Patient is encouraged to call clinic for blood glucose levels less than 70 or above 300 mg /dl.  - Patient specific target  for A1c; LDL, HDL, Triglycerides, and  Waist Circumference were discussed in detail.  2) BP/HTN: she is advised to home monitor blood pressure and report if > 140/90 on 2 separate readings.  She has enough antihypertensive medications including amlodipine, olmesartan, clonidine.     3) Lipids/HPL: Her recent lipid panel showed improved LDL at 105 from 127.  She is advised to continue simvastatin 40 mg p.o. nightly.    Side effects and precautions discussed with her.   4)  Weight/Diet: CDE consult in progress, exercise, and carbohydrates information provided.  5) hypothyroidism: -Her previsit thyroid function tests are consistent with appropriate replacement.  She is advised to continue levothyroxine 137 mcg p.o. daily before breakfast.     - We discussed about the correct intake of her thyroid hormone, on empty stomach at fasting, with water, separated by at least 30 minutes from breakfast and other medications,  and separated by more than 4 hours from calcium, iron, multivitamins, acid reflux medications (PPIs). -Patient is made aware of the fact that thyroid hormone replacement is needed for life, dose to be adjusted by periodic monitoring of thyroid function tests.   6) Chronic Care/Health Maintenance:  -Patient is on ACEI/ARB and Statin medications and encouraged to continue to follow up with Ophthalmology, Podiatrist at least  yearly or according to recommendations, and advised to  stay away from smoking. I have recommended yearly flu vaccine and pneumonia vaccination at least every 5 years; moderate intensity exercise for up to 150 minutes weekly; and  sleep for at least 7 hours a day.  I advised patient to maintain close follow up with her PCP for primary care needs.  - Patient Care Time Today:  25 min, of which >50% was spent in  counseling and the rest reviewing her  current and  previous labs/studies, previous treatments, her blood glucose readings, and medications' doses and developing a plan for long-term care based on the latest recommendations for standards of care.   Holly Baldwin participated in the discussions, expressed understanding, and voiced agreement with the above plans.  All questions were answered  to her satisfaction. she is encouraged to contact clinic should she have any questions or concerns prior to her return visit.   Follow up plan: Return in about 10 days (around 10/29/2018), or Phone, for Follow up with Meter and Logs Only - no Labs.  Glade Lloyd, MD Phone: [phone]  Fax: [phone]  This note was partially dictated with voice recognition software. Similar sounding words can be transcribed inadequately or may not  be corrected upon review.  10/19/2018, 11:43 AM

## 2018-10-26 ENCOUNTER — Other Ambulatory Visit: Payer: Self-pay

## 2018-10-26 DIAGNOSIS — IMO0002 Reserved for concepts with insufficient information to code with codable children: Secondary | ICD-10-CM

## 2018-10-26 DIAGNOSIS — I10 Essential (primary) hypertension: Secondary | ICD-10-CM

## 2018-10-26 DIAGNOSIS — E039 Hypothyroidism, unspecified: Secondary | ICD-10-CM

## 2018-10-26 DIAGNOSIS — E1165 Type 2 diabetes mellitus with hyperglycemia: Secondary | ICD-10-CM

## 2018-10-26 DIAGNOSIS — I5033 Acute on chronic diastolic (congestive) heart failure: Secondary | ICD-10-CM

## 2018-10-26 NOTE — Patient Outreach (Signed)
[facility]) Care Management  10/26/2018  ADALIE MAND [birth year] [id number]    Follow-up outreach with Mrs. Fojtik. She continues to express concerns regarding medications. Her husband is attempting to assist however she is very concerned that her medications are not being prepared properly. She is agreeable to outreach from a [facility] Pharmacist to discuss medication adherence packages.   PLAN -Will submit referral for [facility] Pharmacist. -Will follow-up with Mrs. Junio within three weeks.  [facility] [phone]

## 2018-10-27 ENCOUNTER — Other Ambulatory Visit: Payer: Self-pay | Admitting: Pharmacist

## 2018-10-27 NOTE — Patient Outreach (Signed)
Holly Baldwin [facility]) Care Management  Holly Baldwin   10/27/2018  Holly Baldwin 1945/12/16 [id number]  Reason for referral: Medication Management  Referral source: [facility] RN Current insurance: [facility]  PMHx includes but not limited to:  Anxiety, T2DM, GERD, HTN, HLD, vision difficulties  Outreach:  Successful telephone call with Holly Baldwin.  HIPAA identifiers verified.   Subjective:  Patient reports that she would like to have medications mailed to her from her insurance.  She thinks she has [employer] [facility] Medicare. She is not able to read her insurance card well due to vision difficulties and her spouse is not at home to help.  She states she does not want to have medications from a compliance pack.  She would be agreeable to using a pillbox however I explained that she would have to have her spouse or family do this if medications are being sent from mail order in pillbottles.  Mail order does not have the pill packaging services.  Patient voiced understanding.  She is unable to review medications or write down Holly Baldwin mail order ([facility]) information to give to providers. She requests that I call her next week when her spouse will be around.   Plan: F/u with patient next week  Holly Baldwin, PharmD, [place] [phone]

## 2018-11-01 ENCOUNTER — Other Ambulatory Visit: Payer: Self-pay | Admitting: Pharmacist

## 2018-11-01 ENCOUNTER — Ambulatory Visit: Payer: Self-pay | Admitting: Pharmacist

## 2018-11-01 NOTE — Patient Outreach (Signed)
[facility] [place]) Care Management  [place] 11/01/2018  SHAQUAYA WUELLNER Jun 30, 1945 [id number]  Reason for referral: Medication Management  Referral source: [facility] RN Current insurance: [facility]  PMHx includes but not limited to:  Anxiety, T2DM, GERD, HTN, HLD, vision difficulties  Outreach:  Successful call with patient and spouse.  HIPAA identifiers verified.  Able to review medications with spouse.  Spouse reports he organizes medications for patient into a pillbox.  He plans to continue this with mail order pharmacy.   Medications Reviewed Today    Reviewed by Rudean Haskell, RPH (Pharmacist) on 11/01/18 at 1320  Med List Status: <None>  Medication Order Taking? Sig Documenting Provider Last Dose Status Informant  albuterol (PROVENTIL HFA;VENTOLIN HFA) 108 (90 Base) MCG/ACT inhaler [id number] Yes Inhale into the lungs every 6 (six) hours as needed for wheezing or shortness of breath. [provider] Taking Active Self  amLODipine (NORVASC) 10 MG tablet [id number] Yes Take 1 tablet (10 mg total) by mouth daily. Barton Dubois, MD Taking Active   aspirin EC 81 MG tablet [id number] Yes Take 81 mg by mouth at bedtime. [provider] Taking Active Self  budesonide-formoterol (SYMBICORT) 160-4.5 MCG/ACT inhaler [id number] Yes Inhale 2 puffs into the lungs 2 (two) times daily. [provider] Taking Active Self  capsaicin (ZOSTRIX) 0.025 % cream [id number] Yes Apply topically 2 (two) times daily. [provider] Taking Active Self  carvedilol (COREG) 25 MG tablet [id number] Yes Take 1 tablet (25 mg total) by mouth 2 (two) times daily with a meal. Barton Dubois, MD Taking Active   Cholecalciferol (VITAMIN D3) 125 MCG (5000 UT) CAPS [id number] Yes TAKE 1 CAPSULE EVERY DAY Nida, Marella Chimes, MD Taking Active Self  cycloSPORINE (RESTASIS) 0.05 % ophthalmic emulsion [id number] No Place 1 drop into both eyes daily. [provider] Not Taking Active Self        Discontinued 11/01/18 1320 (Discontinued by provider)            Med Note (Ahman Dugdale E   Mon Nov 01, 2018  1:20 PM)    EPINEPHrine (EPI-PEN) 0.3 mg/0.3 mL DEVI [id number] Yes Inject 0.3 mg into the muscle once. [provider] Taking Active Self  FLUoxetine (PROZAC) 20 MG capsule [id number] Yes Take 20 mg by mouth every morning.  [provider] Taking Active Self  furosemide (LASIX) 40 MG tablet [id number] Yes Take 1 tablet (40 mg total) by mouth daily. Barton Dubois, MD Taking Active   gabapentin (NEURONTIN) 100 MG capsule [id number] Yes TAKE 1 CAPSULE TWICE DAILY  Patient taking differently: 100 mg daily.    Cassandria Anger, MD Taking Active Self  hydrALAZINE (APRESOLINE) 100 MG tablet [id number] Yes Take 1 tablet (100 mg total) by mouth 3 (three) times daily. Barton Dubois, MD Taking Active   insulin aspart (NOVOLOG) 100 UNIT/ML FlexPen [id number] Yes Inject 10 Units into the skin 3 (three) times daily with meals. Flexpen [provider] Taking Active   Insulin Glargine (LANTUS SOLOSTAR) 100 UNIT/ML Solostar Pen [id number] Yes Inject 50 Units into the skin at bedtime. INJECT  50 UNITS SUBCUTANEOUSLY  AT  10PM  Patient taking differently: Inject 40 Units into the skin at bedtime. INJECT  50 UNITS SUBCUTANEOUSLY  AT  10PM   Cassandria Anger, MD Taking Active   isosorbide mononitrate (IMDUR) 60 MG 24 hr tablet [id number] Yes Take 1 tablet (60 mg total) by mouth daily. Barton Dubois, MD Taking Active   levothyroxine (  SYNTHROID, LEVOTHROID) 137 MCG tablet [id number] Yes Take 1 tablet (137 mcg total) by mouth daily before breakfast. Cassandria Anger, MD Taking Active Self  simvastatin (ZOCOR) 40 MG tablet [id number] Yes TAKE 1 TABLET EVERY DAY Fayrene Helper, MD Taking Active Self  topiramate (TOPAMAX) 100 MG tablet [id number] Yes Take 1 tablet by mouth 2 (two) times daily. [provider] Taking Active  Self  ziprasidone (GEODON) 80 MG capsule [id number] Yes Take 80 mg by mouth at bedtime.   [provider] Taking Active Self          Assessment:  Mail Order:  -Reviewed mail order pharmacy options for [facility] ([facility]) and provided mail order pharmacy phone number to patient and spouse.   Patient and spouse will call [facility] to start process and also contact PCP to send in new RXs.  -Spouse states patient is getting Extra Help based on co-pays and does not need medication assistance.  -Spouse reports medication adherence is going well for patient, no concerns at this time.    Medication Review: -Insulin:  Patient able to verbalize insulin doses.  Spouse states he does not give short acting insulin if CBG < 100.  Denies any recent hypoglycemia events.  Aware of how to treat if hypoglycemia occurs.   -Epipen in home per spouse and patient and they report understanding how to administer if needed -Amlodipine and simvastatin:Plasma concentrations and pharmacologic effects of simvastatin may be increased by amlodipine. The daily dose of simvastatin should not exceed 20 mg in patients receiving amlodipine according to official package labeling. Monitor closely for signs / symptoms of rhabdomylosis or muscle aches/pains and reduce dose as clinically warranted   Plan: [place] case is being closed due to the following reasons: -No further medication questions or concerns from patient or spouse at this time. -I have provided my contact information if patient or family needs to reach out to me in the future.  -Thank you for allowing [facility] pharmacy to be involved in this patient's care.    Ralene Bathe, PharmD, [place] [phone]

## 2018-11-02 ENCOUNTER — Other Ambulatory Visit: Payer: Self-pay

## 2018-11-02 ENCOUNTER — Ambulatory Visit: Payer: Medicare Other | Admitting: "Endocrinology

## 2018-11-02 ENCOUNTER — Encounter: Payer: Self-pay | Admitting: "Endocrinology

## 2018-11-02 ENCOUNTER — Ambulatory Visit (INDEPENDENT_AMBULATORY_CARE_PROVIDER_SITE_OTHER): Payer: Medicare Other | Admitting: "Endocrinology

## 2018-11-02 DIAGNOSIS — E039 Hypothyroidism, unspecified: Secondary | ICD-10-CM | POA: Diagnosis not present

## 2018-11-02 DIAGNOSIS — IMO0002 Reserved for concepts with insufficient information to code with codable children: Secondary | ICD-10-CM

## 2018-11-02 DIAGNOSIS — I10 Essential (primary) hypertension: Secondary | ICD-10-CM

## 2018-11-02 DIAGNOSIS — E782 Mixed hyperlipidemia: Secondary | ICD-10-CM

## 2018-11-02 DIAGNOSIS — E1165 Type 2 diabetes mellitus with hyperglycemia: Secondary | ICD-10-CM | POA: Diagnosis not present

## 2018-11-02 DIAGNOSIS — E118 Type 2 diabetes mellitus with unspecified complications: Secondary | ICD-10-CM | POA: Diagnosis not present

## 2018-11-02 DIAGNOSIS — Z794 Long term (current) use of insulin: Secondary | ICD-10-CM

## 2018-11-02 NOTE — Progress Notes (Signed)
11/02/2018                                                    Endocrinology Telehealth Visit Follow up Note -During COVID -19 Pandemic  This visit type was conducted due to national recommendations for restrictions regarding the COVID-19 Pandemic  in an effort to limit this patient's exposure and mitigate transmission of the corona virus.  Due to her co-morbid illnesses, Holly Baldwin is at  moderate to high risk for complications without adequate follow up.  This format is felt to be most appropriate for her at this time.  I connected with this patient on 11/02/2018   by telephone and verified that I am speaking with the correct person using two identifiers. Holly Baldwin, 26-Oct-1945. she has verbally consented to this visit. All issues noted in this document were discussed and addressed. The format was not optimal for physical exam.     Subjective:    Patient ID: Holly Baldwin, female    DOB: 1946-02-18,    Past Medical History:  Diagnosis Date  . Anxiety   . Chronic abdominal pain   . Diabetes mellitus   . GERD (gastroesophageal reflux disease)   . Hiatal hernia   . Hypercholesteremia   . Hypertension    Past Surgical History:  Procedure Laterality Date  . ABDOMINAL HYSTERECTOMY    . COLONOSCOPY N/A 06/23/2012   Procedure: COLONOSCOPY;  Surgeon: Rogene Houston, MD;  Location: [facility];  Service: Endoscopy;  Laterality: N/A;  100-moved to 1200 Ann to notify pt  . ESOPHAGOGASTRODUODENOSCOPY N/A 05/06/2012   Procedure: ESOPHAGOGASTRODUODENOSCOPY (EGD);  Surgeon: Rogene Houston, MD;  Location: [facility] END[facility];  Service: Endoscopy;  Laterality: N/A;  . MASS EXCISION Right 05/26/2012   Procedure: EXCISION NEOPLASM RIGHT THIGH;  Surgeon: Jamesetta So, MD;  Location: [facility] ORS;  Service: General;  Laterality: Right;   Social History   Socioeconomic History  . Marital status: Married    Spouse name: Not on file  . Number of children: Not on file  . Years of education: Not on file  .  Highest education level: Not on file  Occupational History  . Not on file  Social Needs  . Financial resource strain: Not on file  . Food insecurity    Worry: Sometimes true    Inability: Sometimes true  . Transportation needs    Medical: No    Non-medical: No  Tobacco Use  . Smoking status: Never Smoker  . Smokeless tobacco: Never Used  Substance and Sexual Activity  . Alcohol use: No  . Drug use: No  . Sexual activity: Not on file  Lifestyle  . Physical activity    Days per week: Not on file    Minutes per session: Not on file  . Stress: Not on file  Relationships  . Social Herbalist on phone: Not on file    Gets together: Not on file    Attends religious service: Not on file    Active member of club or organization: Not on file    Attends meetings of clubs or organizations: Not on file    Relationship status: Not on file  Other Topics Concern  . Not on file  Social History Narrative  . Not on file   Outpatient  Encounter Medications as of 11/02/2018  Medication Sig  . albuterol (PROVENTIL HFA;VENTOLIN HFA) 108 (90 Base) MCG/ACT inhaler Inhale into the lungs every 6 (six) hours as needed for wheezing or shortness of breath.  Marland Kitchen amLODipine (NORVASC) 10 MG tablet Take 1 tablet (10 mg total) by mouth daily.  Marland Kitchen aspirin EC 81 MG tablet Take 81 mg by mouth at bedtime.  . budesonide-formoterol (SYMBICORT) 160-4.5 MCG/ACT inhaler Inhale 2 puffs into the lungs 2 (two) times daily.  . capsaicin (ZOSTRIX) 0.025 % cream Apply topically 2 (two) times daily.  . carvedilol (COREG) 25 MG tablet Take 1 tablet (25 mg total) by mouth 2 (two) times daily with a meal.  . Cholecalciferol (VITAMIN D3) 125 MCG (5000 UT) CAPS TAKE 1 CAPSULE EVERY DAY  . cycloSPORINE (RESTASIS) 0.05 % ophthalmic emulsion Place 1 drop into both eyes daily.  Marland Kitchen EPINEPHrine (EPI-PEN) 0.3 mg/0.3 mL DEVI Inject 0.3 mg into the muscle once.  Marland Kitchen FLUoxetine (PROZAC) 20 MG capsule Take 20 mg by mouth every  morning.   . furosemide (LASIX) 40 MG tablet Take 1 tablet (40 mg total) by mouth daily.  Marland Kitchen gabapentin (NEURONTIN) 100 MG capsule TAKE 1 CAPSULE TWICE DAILY (Patient taking differently: 100 mg daily. )  . hydrALAZINE (APRESOLINE) 100 MG tablet Take 1 tablet (100 mg total) by mouth 3 (three) times daily.  . insulin aspart (NOVOLOG) 100 UNIT/ML FlexPen Inject 10 Units into the skin 3 (three) times daily with meals. Flexpen  . Insulin Glargine (LANTUS SOLOSTAR) 100 UNIT/ML Solostar Pen Inject 50 Units into the skin at bedtime. INJECT  50 UNITS SUBCUTANEOUSLY  AT  10PM (Patient taking differently: Inject 40 Units into the skin at bedtime. INJECT  50 UNITS SUBCUTANEOUSLY  AT  10PM)  . isosorbide mononitrate (IMDUR) 60 MG 24 hr tablet Take 1 tablet (60 mg total) by mouth daily.  Marland Kitchen levothyroxine (SYNTHROID, LEVOTHROID) 137 MCG tablet Take 1 tablet (137 mcg total) by mouth daily before breakfast.  . simvastatin (ZOCOR) 40 MG tablet TAKE 1 TABLET EVERY DAY  . topiramate (TOPAMAX) 100 MG tablet Take 1 tablet by mouth 2 (two) times daily.  . ziprasidone (GEODON) 80 MG capsule Take 80 mg by mouth at bedtime.     No facility-administered encounter medications on file as of 11/02/2018.    ALLERGIES: Allergies  Allergen Reactions  . Bee Venom Anaphylaxis  . Ace Inhibitors   . Penicillins Hives    .Has patient had a PCN reaction causing immediate rash, facial/tongue/throat swelling, SOB or lightheadedness with hypotension: Yes Has patient had a PCN reaction causing severe rash involving mucus membranes or skin necrosis: No Has patient had a PCN reaction that required hospitalization: Yes Has patient had a PCN reaction occurring within the last 10 years: No If all of the above answers are "NO", then may proceed with Cephalosporin use.    VACCINATION STATUS:  There is no immunization history on file for this patient.  Diabetes She presents for her follow-up diabetic visit. She has type 2 diabetes  mellitus. Onset time: she was diagnosed at approximate age of 65 years. Her disease course has been improving. There are no hypoglycemic associated symptoms. Pertinent negatives for hypoglycemia include no confusion, headaches, pallor or seizures. Pertinent negatives for diabetes include no chest pain, no fatigue, no polydipsia, no polyphagia and no polyuria. There are no hypoglycemic complications. Symptoms are improving. Diabetic complications include autonomic neuropathy. Risk factors for coronary artery disease include diabetes mellitus, dyslipidemia, obesity and sedentary lifestyle. She is  following a generally unhealthy diet. She has not had a previous visit with a dietitian. She never participates in exercise. Her home blood glucose trend is decreasing steadily. Her breakfast blood glucose range is generally 130-140 mg/dl. Her lunch blood glucose range is generally 140-180 mg/dl. Her dinner blood glucose range is generally 140-180 mg/dl. Her bedtime blood glucose range is generally 140-180 mg/dl. Her overall blood glucose range is 140-180 mg/dl. (Due to her visual impairment, patient needed assistance to read her logs to me over the phone, her husband was not there to help her.  Her previsit labs show A1c of 7.1%.) An ACE inhibitor/angiotensin II receptor blocker is being taken.  Thyroid Problem Presents for follow-up (She is currently on levothyroxine 137 mcg p.o. nightly.  She reports compliance with medication.) visit. Patient reports no cold intolerance, diarrhea, fatigue, heat intolerance or palpitations. The symptoms have been stable. Past treatments include levothyroxine. Her past medical history is significant for diabetes and hyperlipidemia.  Hyperlipidemia This is a chronic problem. The current episode started more than 1 year ago. Exacerbating diseases include diabetes and obesity. Pertinent negatives include no chest pain, myalgias or shortness of breath. Current antihyperlipidemic treatment  includes statins. Risk factors for coronary artery disease include dyslipidemia, diabetes mellitus, hypertension, obesity, a sedentary lifestyle and post-menopausal.  Hypertension This is a chronic problem. The current episode started more than 1 year ago. The problem is uncontrolled (Patient's pressure this morning was extremely high reading 243/81 and repeat measurement was 241/72.  Patient is asymptomatic.  Patient was sent with a note to emergency room for evaluation and treatment.). Pertinent negatives include no chest pain, headaches, palpitations or shortness of breath. Risk factors for coronary artery disease include dyslipidemia, diabetes mellitus, obesity, post-menopausal state and sedentary lifestyle. Past treatments include angiotensin blockers, calcium channel blockers and central alpha agonists. Identifiable causes of hypertension include a thyroid problem.     Review of systems: Limited as above.  Objective:    There were no vitals taken for this visit.  Wt Readings from Last 3 Encounters:  08/26/18 200 lb (90.7 kg)  08/25/18 220 lb 0.3 oz (99.8 kg)  05/05/18 215 lb 12.8 oz (97.9 kg)      Results for orders placed or performed during the hospital encounter of 08/26/18  CBC with Differential/Platelet  Result Value Ref Range   WBC 9.2 4.0 - 10.5 K/uL   RBC 3.63 (L) 3.87 - 5.11 MIL/uL   Hemoglobin 9.5 (L) 12.0 - 15.0 g/dL   HCT 31.9 (L) 36.0 - 46.0 %   MCV 87.9 80.0 - 100.0 fL   MCH 26.2 26.0 - 34.0 pg   MCHC 29.8 (L) 30.0 - 36.0 g/dL   RDW 15.2 11.5 - 15.5 %   Platelets 344 150 - 400 K/uL   nRBC 0.0 0.0 - 0.2 %   Neutrophils Relative % 67 %   Neutro Abs 6.2 1.7 - 7.7 K/uL   Lymphocytes Relative 18 %   Lymphs Abs 1.6 0.7 - 4.0 K/uL   Monocytes Relative 12 %   Monocytes Absolute 1.1 (H) 0.1 - 1.0 K/uL   Eosinophils Relative 2 %   Eosinophils Absolute 0.2 0.0 - 0.5 K/uL   Basophils Relative 1 %   Basophils Absolute 0.1 0.0 - 0.1 K/uL   Immature Granulocytes 0 %    Abs Immature Granulocytes 0.04 0.00 - 0.07 K/uL  Comprehensive metabolic panel  Result Value Ref Range   Sodium 140 135 - 145 mmol/L   Potassium 4.1 3.5 -  5.1 mmol/L   Chloride 104 98 - 111 mmol/L   CO2 26 22 - 32 mmol/L   Glucose, Bld 103 (H) 70 - 99 mg/dL   BUN 38 (H) 8 - 23 mg/dL   Creatinine, Ser 2.39 (H) 0.44 - 1.00 mg/dL   Calcium 8.4 (L) 8.9 - 10.3 mg/dL   Total Protein 5.9 (L) 6.5 - 8.1 g/dL   Albumin 2.8 (L) 3.5 - 5.0 g/dL   AST 38 15 - 41 U/L   ALT 50 (H) 0 - 44 U/L   Alkaline Phosphatase 103 38 - 126 U/L   Total Bilirubin 0.4 0.3 - 1.2 mg/dL   GFR calc non Af Amer 19 (L) >60 mL/min   GFR calc Af Amer 22 (L) >60 mL/min   Anion gap 10 5 - 15  Lipase, blood  Result Value Ref Range   Lipase 26 11 - 51 U/L  Urinalysis, Routine w reflex microscopic  Result Value Ref Range   Color, Urine STRAW (A) YELLOW   APPearance CLEAR CLEAR   Specific Gravity, Urine 1.008 1.005 - 1.030   pH 7.0 5.0 - 8.0   Glucose, UA 50 (A) NEGATIVE mg/dL   Hgb urine dipstick NEGATIVE NEGATIVE   Bilirubin Urine NEGATIVE NEGATIVE   Ketones, ur NEGATIVE NEGATIVE mg/dL   Protein, ur 100 (A) NEGATIVE mg/dL   Nitrite NEGATIVE NEGATIVE   Leukocytes,Ua NEGATIVE NEGATIVE   RBC / HPF 0-5 0 - 5 RBC/hpf   WBC, UA 0-5 0 - 5 WBC/hpf   Bacteria, UA NONE SEEN NONE SEEN   Squamous Epithelial / LPF 0-5 0 - 5   Mucus PRESENT   Troponin I (High Sensitivity)  Result Value Ref Range   Troponin I (High Sensitivity) 18.00 (H) <18 ng/L  Troponin I (High Sensitivity)  Result Value Ref Range   Troponin I (High Sensitivity) 18.00 (H) <18 ng/L  Troponin I (High Sensitivity)  Result Value Ref Range   Troponin I (High Sensitivity) 18.00 (H) <18 ng/L   Diabetic Labs (most recent): Lab Results  Component Value Date   HGBA1C 7.1 (H) 08/21/2018   HGBA1C 6.3 (H) 04/28/2018   HGBA1C 7.3 (H) 12/14/2017   Lipid Panel     Component Value Date/Time   CHOL 162 08/21/2018 0450   TRIG 103 08/21/2018 0450   HDL 72  08/21/2018 0450   CHOLHDL 2.3 08/21/2018 0450   VLDL 21 08/21/2018 0450   LDLCALC 69 08/21/2018 0450   LDLCALC 105 (H) 12/14/2017 0921     Assessment & Plan:   1. Uncontrolled type 2 diabetes mellitus with complication Her diabetes is  complicated by CAD,  neuropathy , microalbuminuria, worsening CKD,  and patient remains at a high risk for more acute and chronic complications of diabetes which include CAD, CVA, CKD, retinopathy, and neuropathy. These are all discussed in detail with the patient.  With the help of her husband, she reported her glycemic profile detailed above.  Her average blood glucose is between 150 and 165 mg/dl  With recent A1c of 7.1%.  - Recent labs reviewed with her.   - I have re-counseled the patient on diet management and weight loss  by adopting a carbohydrate restricted / protein rich  Diet.  - she  admits there is a room for improvement in her diet and drink choices. -  Suggestion is made for her to avoid simple carbohydrates  from her diet including Cakes, Sweet Desserts / Pastries, Ice Cream, Soda (diet and regular), Sweet Tea, Candies,  Chips, Cookies, Sweet Pastries,  Store Bought Juices, Alcohol in Excess of  1-2 drinks a day, Artificial Sweeteners, Coffee Creamer, and "Sugar-free" Products. This will help patient to have stable blood glucose profile and potentially avoid unintended weight gain.   - Patient is advised to stick to a routine mealtimes to eat 3 meals  a day and avoid unnecessary snacks ( to snack only to correct hypoglycemia).  - I have approached patient with the following individualized plan to manage diabetes and patient agrees.  -She reports near target glycemic profile, no hypoglycemia nor major hypoglycemia.    She will continue to require intensive treatment with basal/bolus insulin in order for her to achieve and maintain control of diabetes to target.  -She is advised to continue Lantus at 50 units nightly, advised to continue  NovoLog 10  units of NovoLog 3 times a day for pre-meal blood glucose readings above 90 mg per DL.   She is advised to continue monitoring blood glucose 4 times a day-before meals and at bedtime and will be called again in 10 days.  She is advised to have her husband with her during her next visit.  -Patient is encouraged to call clinic for blood glucose levels less than 70 or above 300 mg /dl.  - Patient specific target  for A1c; LDL, HDL, Triglycerides, and  Waist Circumference were discussed in detail.  2) BP/HTN: she is advised to home monitor blood pressure and report if > 140/90 on 2 separate readings.  She has enough antihypertensive medications including amlodipine, olmesartan, clonidine.     3) Lipids/HPL: Her recent lipid panel showed improved LDL at 105 from 127.  She is advised to continue simvastatin 40 mg p.o. nightly.     Side effects and precautions discussed with her.   4)  Weight/Diet: CDE consult in progress, exercise, and carbohydrates information provided.  5) hypothyroidism: -Her previsit thyroid function tests are consistent with appropriate replacement.  She is advised to continue levothyroxine 137 mcg p.o. daily before breakfast.     - We discussed about the correct intake of her thyroid hormone, on empty stomach at fasting, with water, separated by at least 30 minutes from breakfast and other medications,  and separated by more than 4 hours from calcium, iron, multivitamins, acid reflux medications (PPIs). -Patient is made aware of the fact that thyroid hormone replacement is needed for life, dose to be adjusted by periodic monitoring of thyroid function tests.  6) Chronic Care/Health Maintenance:  -Patient is on ACEI/ARB and Statin medications and encouraged to continue to follow up with Ophthalmology, Podiatrist at least yearly or according to recommendations, and advised to  stay away from smoking. I have recommended yearly flu vaccine and pneumonia vaccination at  least every 5 years; moderate intensity exercise for up to 150 minutes weekly; and  sleep for at least 7 hours a day.  I advised patient to maintain close follow up with her PCP for primary care needs.  - Patient Care Time Today:  25 min, of which >50% was spent in  counseling and the rest reviewing her  current and  previous labs/studies, previous treatments, her blood glucose readings, and medications' doses and developing a plan for long-term care based on the latest recommendations for standards of care.   Holly Baldwin participated in the discussions, expressed understanding, and voiced agreement with the above plans.  All questions were answered to her satisfaction. she is encouraged to contact clinic should she have any questions  or concerns prior to her return visit.   Follow up plan: Return in about 9 weeks (around 01/04/2019) for Bring Meter and Logs- A1c in Office.  Glade Lloyd, MD Phone: [phone]  Fax: [phone]  This note was partially dictated with voice recognition software. Similar sounding words can be transcribed inadequately or may not  be corrected upon review.  11/02/2018, 5:21 PM

## 2018-11-16 ENCOUNTER — Other Ambulatory Visit: Payer: Self-pay

## 2018-11-16 NOTE — Patient Outreach (Signed)
[place] [facility]) Care Management  11/16/2018  Holly Baldwin [birth year] [id number]    Brief outreach with Holly Baldwin. She reports feeling very good today. Denies worsening or concerning symptoms. Reports ambulating well and increased activity tolerance. Denies falls.  She is very pleased with the medication mail order system. Reports having all required medications and taking as prescribed. Her husband continues to assist with medication preparation.  Reports compliance with treatment recommendations. She did not have her blood pressure or FBS log available at the time of the call. Agreeable to outreach later this month to discuss ranges. Current Outpatient Medications on File Prior to Visit  Medication Sig Dispense Refill  . albuterol (PROVENTIL HFA;VENTOLIN HFA) 108 (90 Base) MCG/ACT inhaler Inhale into the lungs every 6 (six) hours as needed for wheezing or shortness of breath.    Marland Kitchen amLODipine (NORVASC) 10 MG tablet Take 1 tablet (10 mg total) by mouth daily. 30 tablet 3  . aspirin EC 81 MG tablet Take 81 mg by mouth at bedtime.    . budesonide-formoterol (SYMBICORT) 160-4.5 MCG/ACT inhaler Inhale 2 puffs into the lungs 2 (two) times daily.    . capsaicin (ZOSTRIX) 0.025 % cream Apply topically 2 (two) times daily.    . carvedilol (COREG) 25 MG tablet Take 1 tablet (25 mg total) by mouth 2 (two) times daily with a meal. 60 tablet 3  . Cholecalciferol (VITAMIN D3) 125 MCG (5000 UT) CAPS TAKE 1 CAPSULE EVERY DAY 90 capsule 0  . cycloSPORINE (RESTASIS) 0.05 % ophthalmic emulsion Place 1 drop into both eyes daily.    Marland Kitchen EPINEPHrine (EPI-PEN) 0.3 mg/0.3 mL DEVI Inject 0.3 mg into the muscle once.    Marland Kitchen FLUoxetine (PROZAC) 20 MG capsule Take 20 mg by mouth every morning.     . furosemide (LASIX) 40 MG tablet Take 1 tablet (40 mg total) by mouth daily. 30 tablet 3  . gabapentin (NEURONTIN) 100 MG capsule TAKE 1 CAPSULE TWICE DAILY (Patient taking differently: 100 mg daily. ) 180 capsule  0  . hydrALAZINE (APRESOLINE) 100 MG tablet Take 1 tablet (100 mg total) by mouth 3 (three) times daily. 90 tablet 3  . insulin aspart (NOVOLOG) 100 UNIT/ML FlexPen Inject 10 Units into the skin 3 (three) times daily with meals. Flexpen    . Insulin Glargine (LANTUS SOLOSTAR) 100 UNIT/ML Solostar Pen Inject 50 Units into the skin at bedtime. INJECT  50 UNITS SUBCUTANEOUSLY  AT  10PM (Patient taking differently: Inject 40 Units into the skin at bedtime. INJECT  50 UNITS SUBCUTANEOUSLY  AT  10PM) 15 pen 2  . isosorbide mononitrate (IMDUR) 60 MG 24 hr tablet Take 1 tablet (60 mg total) by mouth daily. 30 tablet 3  . levothyroxine (SYNTHROID, LEVOTHROID) 137 MCG tablet Take 1 tablet (137 mcg total) by mouth daily before breakfast. 90 tablet 0  . simvastatin (ZOCOR) 40 MG tablet TAKE 1 TABLET EVERY DAY 90 tablet 1  . topiramate (TOPAMAX) 100 MG tablet Take 1 tablet by mouth 2 (two) times daily.    . ziprasidone (GEODON) 80 MG capsule Take 80 mg by mouth at bedtime.       No current facility-administered medications on file prior to visit.    PLAN -Will follow-up within two weeks to update care plan and review BP and FBS ranges.   [facility] [phone]

## 2018-11-29 ENCOUNTER — Other Ambulatory Visit: Payer: Self-pay

## 2018-11-29 NOTE — Patient Outreach (Signed)
[place] [facility]) Care Management  11/29/2018  Holly Baldwin 22-Aug-1945 [id number]   Follow-up outreach with Holly Baldwin regarding daily monitoring. She denies concerning symptoms today.   Reports not monitoring her blood pressure prior to this call but reports most readings have been within range. She does report a recent systolic reading in the 409'W. Reports diastolic ranges in the 11'B and 90's. She is aware of indications for notifying her primary care provider.  She reports a morning FBS of 166mg /dl. She admits to inconsistencies with her diet. She is attempting to decrease her intake of concentrated sugars.  Reports a morning weight of 205lbs. Reports her weights have been within range. Denies increased edema to her abdomen or lower extremities. No changes in activity tolerance. Current Outpatient Medications on File Prior to Visit  Medication Sig Dispense Refill  . albuterol (PROVENTIL HFA;VENTOLIN HFA) 108 (90 Base) MCG/ACT inhaler Inhale into the lungs every 6 (six) hours as needed for wheezing or shortness of breath.    Marland Kitchen amLODipine (NORVASC) 10 MG tablet Take 1 tablet (10 mg total) by mouth daily. 30 tablet 3  . aspirin EC 81 MG tablet Take 81 mg by mouth at bedtime.    . budesonide-formoterol (SYMBICORT) 160-4.5 MCG/ACT inhaler Inhale 2 puffs into the lungs 2 (two) times daily.    . capsaicin (ZOSTRIX) 0.025 % cream Apply topically 2 (two) times daily.    . carvedilol (COREG) 25 MG tablet Take 1 tablet (25 mg total) by mouth 2 (two) times daily with a meal. 60 tablet 3  . Cholecalciferol (VITAMIN D3) 125 MCG (5000 UT) CAPS TAKE 1 CAPSULE EVERY DAY 90 capsule 0  . cycloSPORINE (RESTASIS) 0.05 % ophthalmic emulsion Place 1 drop into both eyes daily.    Marland Kitchen EPINEPHrine (EPI-PEN) 0.3 mg/0.3 mL DEVI Inject 0.3 mg into the muscle once.    Marland Kitchen FLUoxetine (PROZAC) 20 MG capsule Take 20 mg by mouth every morning.     . furosemide (LASIX) 40 MG tablet Take 1 tablet (40 mg total) by  mouth daily. 30 tablet 3  . gabapentin (NEURONTIN) 100 MG capsule TAKE 1 CAPSULE TWICE DAILY (Patient taking differently: 100 mg daily. ) 180 capsule 0  . hydrALAZINE (APRESOLINE) 100 MG tablet Take 1 tablet (100 mg total) by mouth 3 (three) times daily. 90 tablet 3  . insulin aspart (NOVOLOG) 100 UNIT/ML FlexPen Inject 10 Units into the skin 3 (three) times daily with meals. Flexpen    . Insulin Glargine (LANTUS SOLOSTAR) 100 UNIT/ML Solostar Pen Inject 50 Units into the skin at bedtime. INJECT  50 UNITS SUBCUTANEOUSLY  AT  10PM (Patient taking differently: Inject 40 Units into the skin at bedtime. INJECT  50 UNITS SUBCUTANEOUSLY  AT  10PM) 15 pen 2  . isosorbide mononitrate (IMDUR) 60 MG 24 hr tablet Take 1 tablet (60 mg total) by mouth daily. 30 tablet 3  . levothyroxine (SYNTHROID, LEVOTHROID) 137 MCG tablet Take 1 tablet (137 mcg total) by mouth daily before breakfast. 90 tablet 0  . simvastatin (ZOCOR) 40 MG tablet TAKE 1 TABLET EVERY DAY 90 tablet 1  . topiramate (TOPAMAX) 100 MG tablet Take 1 tablet by mouth 2 (two) times daily.    . ziprasidone (GEODON) 80 MG capsule Take 80 mg by mouth at bedtime.       No current facility-administered medications on file prior to visit.    PLAN -Will continue routine outreach.  [facility] [phone]

## 2018-12-09 DIAGNOSIS — Z23 Encounter for immunization: Secondary | ICD-10-CM | POA: Diagnosis not present

## 2018-12-31 ENCOUNTER — Other Ambulatory Visit: Payer: Self-pay

## 2019-01-07 ENCOUNTER — Other Ambulatory Visit: Payer: Self-pay

## 2019-01-07 ENCOUNTER — Ambulatory Visit (INDEPENDENT_AMBULATORY_CARE_PROVIDER_SITE_OTHER): Payer: Medicare Other | Admitting: "Endocrinology

## 2019-01-07 ENCOUNTER — Encounter: Payer: Self-pay | Admitting: "Endocrinology

## 2019-01-07 VITALS — BP 153/94 | HR 62 | Ht 64.0 in | Wt 213.0 lb

## 2019-01-07 DIAGNOSIS — I10 Essential (primary) hypertension: Secondary | ICD-10-CM

## 2019-01-07 DIAGNOSIS — E039 Hypothyroidism, unspecified: Secondary | ICD-10-CM

## 2019-01-07 DIAGNOSIS — E782 Mixed hyperlipidemia: Secondary | ICD-10-CM | POA: Diagnosis not present

## 2019-01-07 DIAGNOSIS — E118 Type 2 diabetes mellitus with unspecified complications: Secondary | ICD-10-CM | POA: Diagnosis not present

## 2019-01-07 DIAGNOSIS — E1165 Type 2 diabetes mellitus with hyperglycemia: Secondary | ICD-10-CM | POA: Diagnosis not present

## 2019-01-07 DIAGNOSIS — Z794 Long term (current) use of insulin: Secondary | ICD-10-CM | POA: Diagnosis not present

## 2019-01-07 DIAGNOSIS — E1122 Type 2 diabetes mellitus with diabetic chronic kidney disease: Secondary | ICD-10-CM | POA: Diagnosis not present

## 2019-01-07 DIAGNOSIS — IMO0002 Reserved for concepts with insufficient information to code with codable children: Secondary | ICD-10-CM

## 2019-01-07 LAB — POCT GLYCOSYLATED HEMOGLOBIN (HGB A1C): Hemoglobin A1C: 6.7 % — AB (ref 4.0–5.6)

## 2019-01-07 MED ORDER — FREESTYLE LIBRE 14 DAY SENSOR MISC: 1.0000 | 2 refills | Status: DC

## 2019-01-07 MED ORDER — FREESTYLE LIBRE 14 DAY READER DEVI: 1.0000 | Freq: Once | 0 refills | Status: AC

## 2019-01-07 NOTE — Progress Notes (Signed)
01/07/2019                                                    Endocrinology Telehealth Visit Follow up Note -During COVID -19 Pandemic  This visit type was conducted due to national recommendations for restrictions regarding the COVID-19 Pandemic  in an effort to limit this patient's exposure and mitigate transmission of the corona virus.  Due to her co-morbid illnesses, Holly Baldwin is at  moderate to high risk for complications without adequate follow up.  This format is felt to be most appropriate for her at this time.  I connected with this patient on 01/07/2019   by telephone and verified that I am speaking with the correct person using two identifiers. Holly Baldwin, [birth year]. she has verbally consented to this visit. All issues noted in this document were discussed and addressed. The format was not optimal for physical exam.     Subjective:    Patient ID: Holly Baldwin, female    DOB: 1945-09-01,    Past Medical History:  Diagnosis Date  . Anxiety   . Chronic abdominal pain   . Diabetes mellitus   . GERD (gastroesophageal reflux disease)   . Hiatal hernia   . Hypercholesteremia   . Hypertension    Past Surgical History:  Procedure Laterality Date  . ABDOMINAL HYSTERECTOMY    . COLONOSCOPY N/A 06/23/2012   Procedure: COLONOSCOPY;  Surgeon: Rogene Houston, MD;  Location: [facility];  Service: Endoscopy;  Laterality: N/A;  100-moved to 1200 Ann to notify pt  . ESOPHAGOGASTRODUODENOSCOPY N/A 05/06/2012   Procedure: ESOPHAGOGASTRODUODENOSCOPY (EGD);  Surgeon: Rogene Houston, MD;  Location: [facility];  Service: Endoscopy;  Laterality: N/A;  . MASS EXCISION Right 05/26/2012   Procedure: EXCISION NEOPLASM RIGHT THIGH;  Surgeon: Jamesetta So, MD;  Location: [facility] ORS;  Service: General;  Laterality: Right;   Social History   Socioeconomic History  . Marital status: Married    Spouse name: Not on file  . Number of children: Not on file  . Years of education: Not on file  .  Highest education level: Not on file  Occupational History  . Not on file  Social Needs  . Financial resource strain: Not on file  . Food insecurity    Worry: Sometimes true    Inability: Sometimes true  . Transportation needs    Medical: No    Non-medical: No  Tobacco Use  . Smoking status: Never Smoker  . Smokeless tobacco: Never Used  Substance and Sexual Activity  . Alcohol use: No  . Drug use: No  . Sexual activity: Not on file  Lifestyle  . Physical activity    Days per week: Not on file    Minutes per session: Not on file  . Stress: Not on file  Relationships  . Social Herbalist on phone: Not on file    Gets together: Not on file    Attends religious service: Not on file    Active member of club or organization: Not on file    Attends meetings of clubs or organizations: Not on file    Relationship status: Not on file  Other Topics Concern  . Not on file  Social History Narrative  . Not on file   Outpatient  Encounter Medications as of 01/07/2019  Medication Sig  . albuterol (PROVENTIL HFA;VENTOLIN HFA) 108 (90 Base) MCG/ACT inhaler Inhale into the lungs every 6 (six) hours as needed for wheezing or shortness of breath.  Marland Kitchen amLODipine (NORVASC) 10 MG tablet Take 1 tablet (10 mg total) by mouth daily.  Marland Kitchen aspirin EC 81 MG tablet Take 81 mg by mouth at bedtime.  . budesonide-formoterol (SYMBICORT) 160-4.5 MCG/ACT inhaler Inhale 2 puffs into the lungs 2 (two) times daily.  . capsaicin (ZOSTRIX) 0.025 % cream Apply topically 2 (two) times daily.  . carvedilol (COREG) 25 MG tablet Take 1 tablet (25 mg total) by mouth 2 (two) times daily with a meal.  . Cholecalciferol (VITAMIN D3) 125 MCG (5000 UT) CAPS TAKE 1 CAPSULE EVERY DAY  . Continuous Blood Gluc Receiver (FREESTYLE LIBRE 14 DAY READER) DEVI 1 each by Does not apply route once for 1 dose.  . Continuous Blood Gluc Sensor (FREESTYLE LIBRE 14 DAY SENSOR) MISC Inject 1 each into the skin every 14 (fourteen)  days. Use as directed.  . cycloSPORINE (RESTASIS) 0.05 % ophthalmic emulsion Place 1 drop into both eyes daily.  Marland Kitchen EPINEPHrine (EPI-PEN) 0.3 mg/0.3 mL DEVI Inject 0.3 mg into the muscle once.  Marland Kitchen FLUoxetine (PROZAC) 20 MG capsule Take 20 mg by mouth every morning.   . furosemide (LASIX) 40 MG tablet Take 1 tablet (40 mg total) by mouth daily.  Marland Kitchen gabapentin (NEURONTIN) 100 MG capsule TAKE 1 CAPSULE TWICE DAILY (Patient taking differently: 100 mg daily. )  . hydrALAZINE (APRESOLINE) 100 MG tablet Take 1 tablet (100 mg total) by mouth 3 (three) times daily.  . insulin aspart (NOVOLOG) 100 UNIT/ML FlexPen Inject 10 Units into the skin 3 (three) times daily with meals.  . Insulin Glargine (LANTUS SOLOSTAR) 100 UNIT/ML Solostar Pen Inject 50 Units into the skin at bedtime. INJECT  50 UNITS SUBCUTANEOUSLY  AT  10PM  . isosorbide mononitrate (IMDUR) 60 MG 24 hr tablet Take 1 tablet (60 mg total) by mouth daily.  Marland Kitchen levothyroxine (SYNTHROID, LEVOTHROID) 137 MCG tablet Take 1 tablet (137 mcg total) by mouth daily before breakfast.  . simvastatin (ZOCOR) 40 MG tablet TAKE 1 TABLET EVERY DAY  . topiramate (TOPAMAX) 100 MG tablet Take 1 tablet by mouth 2 (two) times daily.  . ziprasidone (GEODON) 80 MG capsule Take 80 mg by mouth at bedtime.     No facility-administered encounter medications on file as of 01/07/2019.    ALLERGIES: Allergies  Allergen Reactions  . Bee Venom Anaphylaxis  . Ace Inhibitors   . Penicillins Hives    .Has patient had a PCN reaction causing immediate rash, facial/tongue/throat swelling, SOB or lightheadedness with hypotension: Yes Has patient had a PCN reaction causing severe rash involving mucus membranes or skin necrosis: No Has patient had a PCN reaction that required hospitalization: Yes Has patient had a PCN reaction occurring within the last 10 years: No If all of the above answers are "NO", then may proceed with Cephalosporin use.    VACCINATION STATUS:  There is no  immunization history on file for this patient.  Diabetes She presents for her follow-up diabetic visit. She has type 2 diabetes mellitus. Onset time: she was diagnosed at approximate age of 39 years. Her disease course has been improving. There are no hypoglycemic associated symptoms. Pertinent negatives for hypoglycemia include no confusion, headaches, pallor or seizures. Pertinent negatives for diabetes include no chest pain, no fatigue, no polydipsia, no polyphagia and no polyuria. There are  no hypoglycemic complications. Symptoms are improving. Diabetic complications include autonomic neuropathy. Risk factors for coronary artery disease include diabetes mellitus, dyslipidemia, obesity and sedentary lifestyle. Her weight is fluctuating minimally. She is following a generally unhealthy diet. She has not had a previous visit with a dietitian. She never participates in exercise. Her home blood glucose trend is decreasing steadily. Her breakfast blood glucose range is generally 130-140 mg/dl. Her lunch blood glucose range is generally 130-140 mg/dl. Her dinner blood glucose range is generally 130-140 mg/dl. Her bedtime blood glucose range is generally 130-140 mg/dl. Her overall blood glucose range is 130-140 mg/dl. An ACE inhibitor/angiotensin II receptor blocker is being taken.  Thyroid Problem Presents for follow-up (She is currently on levothyroxine 137 mcg p.o. nightly.  She reports compliance with medication.) visit. Patient reports no cold intolerance, diarrhea, fatigue, heat intolerance or palpitations. The symptoms have been stable. Past treatments include levothyroxine. Her past medical history is significant for diabetes and hyperlipidemia.  Hyperlipidemia This is a chronic problem. The current episode started more than 1 year ago. Exacerbating diseases include diabetes and obesity. Pertinent negatives include no chest pain, myalgias or shortness of breath. Current antihyperlipidemic treatment  includes statins. Risk factors for coronary artery disease include dyslipidemia, diabetes mellitus, hypertension, obesity, a sedentary lifestyle and post-menopausal.  Hypertension This is a chronic problem. The current episode started more than 1 year ago. The problem is uncontrolled (Patient's pressure this morning was extremely high reading 243/81 and repeat measurement was 241/72.  Patient is asymptomatic.  Patient was sent with a note to emergency room for evaluation and treatment.). Pertinent negatives include no chest pain, headaches, palpitations or shortness of breath. Risk factors for coronary artery disease include dyslipidemia, diabetes mellitus, obesity, post-menopausal state and sedentary lifestyle. Past treatments include angiotensin blockers, calcium channel blockers and central alpha agonists. Identifiable causes of hypertension include a thyroid problem.     Review of systems: Limited as above.  Objective:    BP (!) 153/94   Pulse 62   Ht 5\' 4"  (1.626 m)   Wt 213 lb (96.6 kg)   BMI 36.56 kg/m   Wt Readings from Last 3 Encounters:  01/07/19 213 lb (96.6 kg)  08/26/18 200 lb (90.7 kg)  08/25/18 220 lb 0.3 oz (99.8 kg)      Results for orders placed or performed in visit on 01/07/19  HgB A1c  Result Value Ref Range   Hemoglobin A1C 6.7 (A) 4.0 - 5.6 %   HbA1c POC (<> result, manual entry)     HbA1c, POC (prediabetic range)     HbA1c, POC (controlled diabetic range)     Diabetic Labs (most recent): Lab Results  Component Value Date   HGBA1C 6.7 (A) 01/07/2019   HGBA1C 7.1 (H) 08/21/2018   HGBA1C 6.3 (H) 04/28/2018   Lipid Panel     Component Value Date/Time   CHOL 162 08/21/2018 0450   TRIG 103 08/21/2018 0450   HDL 72 08/21/2018 0450   CHOLHDL 2.3 08/21/2018 0450   VLDL 21 08/21/2018 0450   LDLCALC 69 08/21/2018 0450   LDLCALC 105 (H) 12/14/2017 0921     Assessment & Plan:   1. Uncontrolled type 2 diabetes mellitus with complication Her diabetes is   complicated by CAD,  neuropathy , microalbuminuria, worsening CKD,  and patient remains at a high risk for more acute and chronic complications of diabetes which include CAD, CVA, CKD, retinopathy, and neuropathy. These are all discussed in detail with the patient.  She  returns with significantly improved glycemic profile and A1c was 6.7% in the clinic today.  She has no hypoglycemia documented or reported.      - I have re-counseled the patient on diet management and weight loss  by adopting a carbohydrate restricted / protein rich  Diet.  - she  admits there is a room for improvement in her diet and drink choices. -  Suggestion is made for her to avoid simple carbohydrates  from her diet including Cakes, Sweet Desserts / Pastries, Ice Cream, Soda (diet and regular), Sweet Tea, Candies, Chips, Cookies, Sweet Pastries,  Store Bought Juices, Alcohol in Excess of  1-2 drinks a day, Artificial Sweeteners, Coffee Creamer, and "Sugar-free" Products. This will help patient to have stable blood glucose profile and potentially avoid unintended weight gain.   - Patient is advised to stick to a routine mealtimes to eat 3 meals  a day and avoid unnecessary snacks ( to snack only to correct hypoglycemia).  - I have approached patient with the following individualized plan to manage diabetes and patient agrees.  -She reports near target glycemic profile, no hypoglycemia nor major hypoglycemia.    She will continue to require intensive treatment with basal/bolus insulin in order for her to achieve and maintain control of diabetes to target.  -She is advised to continue Lantus 50 units nightly, advised to continue NovoLog 10 units 3 times daily AC for premeal blood glucose readings above 90 mg per DL.   -She is advised to avoid correction due to the fact that it causes confusion to her.    She is advised to continue monitoring blood glucose 4 times a day-before meals and at bedtime .  -Patient is encouraged  to call clinic for blood glucose levels less than 70 or above 300 mg /dl. -She will benefit from continuous glucose monitoring.  I discussed and prescribed the freestyle libre device for her.  - Patient specific target  for A1c; LDL, HDL, Triglycerides, and  Waist Circumference were discussed in detail.  2) BP/HTN: Her blood pressure is not controlled to target.  She has adequate medications, advised to be consistent and continue her current medications including hydralazine 100 mg 3 times a day, torsemide as needed, amlodipine 10 mg p.o. daily, carvedilol 25 mg p.o. twice daily, Imdur 60 mg p.o. daily.   3) Lipids/HPL: Her recent lipid panel showed improved LDL at 105 from 127.  She is advised to continue simvastatin 40 mg p.o. nightly.    Side effects and precautions discussed with her.   4)  Weight/Diet: CDE consult in progress, exercise, and carbohydrates information provided.  5) hypothyroidism: -Her previsit thyroid function tests are consistent with appropriate replacement.  -She is advised to continue levothyroxine 137 mcg p.o. daily before breakfast.    - We discussed about the correct intake of her thyroid hormone, on empty stomach at fasting, with water, separated by at least 30 minutes from breakfast and other medications,  and separated by more than 4 hours from calcium, iron, multivitamins, acid reflux medications (PPIs). -Patient is made aware of the fact that thyroid hormone replacement is needed for life, dose to be adjusted by periodic monitoring of thyroid function tests.   6) Chronic Care/Health Maintenance:  -Patient is on ACEI/ARB and Statin medications and encouraged to continue to follow up with Ophthalmology, Podiatrist at least yearly or according to recommendations, and advised to  stay away from smoking. I have recommended yearly flu vaccine and pneumonia vaccination at least every  5 years; moderate intensity exercise for up to 150 minutes weekly; and  sleep for at  least 7 hours a day.  I advised patient to maintain close follow up with her PCP for primary care needs. - Time spent with the patient: 25 min, of which >50% was spent in reviewing her blood glucose logs , discussing her hypoglycemia and hyperglycemia episodes, reviewing her current and  previous labs / studies and medications  doses and developing a plan to avoid hypoglycemia and hyperglycemia. Please refer to Patient Instructions for Blood Glucose Monitoring and Insulin/Medications Dosing Guide"  in media tab for additional information. Please  also refer to " Patient Self Inventory" in the Media  tab for reviewed elements of pertinent patient history.  Holly Baldwin participated in the discussions, expressed understanding, and voiced agreement with the above plans.  All questions were answered to her satisfaction. she is encouraged to contact clinic should she have any questions or concerns prior to her return visit.   Follow up plan: Return in about 3 months (around 04/09/2019) for Bring Meter and Logs- A1c in Office.  Glade Lloyd, MD Phone: [phone]  Fax: [phone]  This note was partially dictated with voice recognition software. Similar sounding words can be transcribed inadequately or may not  be corrected upon review.  01/07/2019, 11:55 AM

## 2019-01-07 NOTE — Patient Instructions (Signed)

## 2019-01-10 ENCOUNTER — Encounter (INDEPENDENT_AMBULATORY_CARE_PROVIDER_SITE_OTHER): Payer: Self-pay

## 2019-01-10 ENCOUNTER — Encounter (INDEPENDENT_AMBULATORY_CARE_PROVIDER_SITE_OTHER): Payer: Medicare Other | Admitting: Ophthalmology

## 2019-01-12 ENCOUNTER — Other Ambulatory Visit: Payer: Self-pay

## 2019-01-12 MED ORDER — FREESTYLE LIBRE 14 DAY SENSOR MISC: 1.0000 | 5 refills | Status: DC

## 2019-01-17 ENCOUNTER — Other Ambulatory Visit: Payer: Self-pay

## 2019-01-17 MED ORDER — LEVOTHYROXINE SODIUM 137 MCG PO TABS: 137.0000 ug | ORAL_TABLET | Freq: Every day | ORAL | 0 refills | Status: DC

## 2019-01-21 ENCOUNTER — Other Ambulatory Visit: Payer: Self-pay

## 2019-02-03 ENCOUNTER — Encounter (INDEPENDENT_AMBULATORY_CARE_PROVIDER_SITE_OTHER): Payer: Medicare Other | Admitting: Ophthalmology

## 2019-02-04 ENCOUNTER — Encounter (INDEPENDENT_AMBULATORY_CARE_PROVIDER_SITE_OTHER): Payer: Medicare Other | Admitting: Ophthalmology

## 2019-02-04 DIAGNOSIS — H35033 Hypertensive retinopathy, bilateral: Secondary | ICD-10-CM | POA: Diagnosis not present

## 2019-02-04 DIAGNOSIS — E113592 Type 2 diabetes mellitus with proliferative diabetic retinopathy without macular edema, left eye: Secondary | ICD-10-CM | POA: Diagnosis not present

## 2019-02-04 DIAGNOSIS — E113311 Type 2 diabetes mellitus with moderate nonproliferative diabetic retinopathy with macular edema, right eye: Secondary | ICD-10-CM

## 2019-02-04 DIAGNOSIS — I10 Essential (primary) hypertension: Secondary | ICD-10-CM

## 2019-02-04 DIAGNOSIS — E11311 Type 2 diabetes mellitus with unspecified diabetic retinopathy with macular edema: Secondary | ICD-10-CM

## 2019-02-04 DIAGNOSIS — H43813 Vitreous degeneration, bilateral: Secondary | ICD-10-CM | POA: Diagnosis not present

## 2019-03-11 ENCOUNTER — Encounter (INDEPENDENT_AMBULATORY_CARE_PROVIDER_SITE_OTHER): Payer: Medicare Other | Admitting: Ophthalmology

## 2019-03-14 ENCOUNTER — Other Ambulatory Visit: Payer: Self-pay | Admitting: "Endocrinology

## 2019-03-17 ENCOUNTER — Encounter (INDEPENDENT_AMBULATORY_CARE_PROVIDER_SITE_OTHER): Payer: Medicare HMO | Admitting: Ophthalmology

## 2019-03-17 DIAGNOSIS — H43813 Vitreous degeneration, bilateral: Secondary | ICD-10-CM

## 2019-03-17 DIAGNOSIS — E11311 Type 2 diabetes mellitus with unspecified diabetic retinopathy with macular edema: Secondary | ICD-10-CM

## 2019-03-17 DIAGNOSIS — E113512 Type 2 diabetes mellitus with proliferative diabetic retinopathy with macular edema, left eye: Secondary | ICD-10-CM

## 2019-03-17 DIAGNOSIS — E113311 Type 2 diabetes mellitus with moderate nonproliferative diabetic retinopathy with macular edema, right eye: Secondary | ICD-10-CM | POA: Diagnosis not present

## 2019-03-17 DIAGNOSIS — I10 Essential (primary) hypertension: Secondary | ICD-10-CM

## 2019-03-17 DIAGNOSIS — H2513 Age-related nuclear cataract, bilateral: Secondary | ICD-10-CM | POA: Diagnosis not present

## 2019-03-17 DIAGNOSIS — H35033 Hypertensive retinopathy, bilateral: Secondary | ICD-10-CM

## 2019-03-28 ENCOUNTER — Other Ambulatory Visit: Payer: Self-pay | Admitting: "Endocrinology

## 2019-04-04 ENCOUNTER — Other Ambulatory Visit (HOSPITAL_COMMUNITY): Payer: Self-pay | Admitting: Family Medicine

## 2019-04-04 DIAGNOSIS — Z1231 Encounter for screening mammogram for malignant neoplasm of breast: Secondary | ICD-10-CM

## 2019-04-07 ENCOUNTER — Other Ambulatory Visit: Payer: Self-pay | Admitting: "Endocrinology

## 2019-04-13 ENCOUNTER — Ambulatory Visit: Payer: Medicare Other | Admitting: "Endocrinology

## 2019-04-14 ENCOUNTER — Other Ambulatory Visit: Payer: Self-pay

## 2019-04-14 ENCOUNTER — Encounter (INDEPENDENT_AMBULATORY_CARE_PROVIDER_SITE_OTHER): Payer: Medicare HMO | Admitting: Ophthalmology

## 2019-04-14 DIAGNOSIS — H2513 Age-related nuclear cataract, bilateral: Secondary | ICD-10-CM | POA: Diagnosis not present

## 2019-04-14 DIAGNOSIS — E113311 Type 2 diabetes mellitus with moderate nonproliferative diabetic retinopathy with macular edema, right eye: Secondary | ICD-10-CM | POA: Diagnosis not present

## 2019-04-14 DIAGNOSIS — H43813 Vitreous degeneration, bilateral: Secondary | ICD-10-CM

## 2019-04-14 DIAGNOSIS — I10 Essential (primary) hypertension: Secondary | ICD-10-CM | POA: Diagnosis not present

## 2019-04-14 DIAGNOSIS — H35033 Hypertensive retinopathy, bilateral: Secondary | ICD-10-CM

## 2019-04-14 DIAGNOSIS — E113512 Type 2 diabetes mellitus with proliferative diabetic retinopathy with macular edema, left eye: Secondary | ICD-10-CM

## 2019-04-14 DIAGNOSIS — E11311 Type 2 diabetes mellitus with unspecified diabetic retinopathy with macular edema: Secondary | ICD-10-CM | POA: Diagnosis not present

## 2019-04-18 ENCOUNTER — Ambulatory Visit (HOSPITAL_COMMUNITY): Payer: Medicare Other

## 2019-04-21 ENCOUNTER — Ambulatory Visit: Payer: Medicare HMO | Admitting: "Endocrinology

## 2019-04-27 ENCOUNTER — Encounter (INDEPENDENT_AMBULATORY_CARE_PROVIDER_SITE_OTHER): Payer: Medicare HMO | Admitting: Ophthalmology

## 2019-04-27 DIAGNOSIS — E113512 Type 2 diabetes mellitus with proliferative diabetic retinopathy with macular edema, left eye: Secondary | ICD-10-CM | POA: Diagnosis not present

## 2019-04-27 DIAGNOSIS — E11311 Type 2 diabetes mellitus with unspecified diabetic retinopathy with macular edema: Secondary | ICD-10-CM | POA: Diagnosis not present

## 2019-04-28 ENCOUNTER — Other Ambulatory Visit: Payer: Self-pay

## 2019-04-28 ENCOUNTER — Ambulatory Visit (HOSPITAL_COMMUNITY)
Admission: RE | Admit: 2019-04-28 | Discharge: 2019-04-28 | Disposition: A | Discharge status: Home / Self Care | Payer: Medicare HMO | Source: Ambulatory Visit | Attending: Family Medicine | Admitting: Family Medicine

## 2019-04-28 DIAGNOSIS — Z1231 Encounter for screening mammogram for malignant neoplasm of breast: Secondary | ICD-10-CM | POA: Diagnosis not present

## 2019-05-05 ENCOUNTER — Ambulatory Visit: Payer: Medicare HMO | Admitting: "Endocrinology

## 2019-05-05 ENCOUNTER — Other Ambulatory Visit: Payer: Self-pay

## 2019-05-05 ENCOUNTER — Encounter: Payer: Self-pay | Admitting: "Endocrinology

## 2019-05-05 VITALS — BP 145/68 | HR 53 | Ht 64.0 in | Wt 213.4 lb

## 2019-05-05 DIAGNOSIS — IMO0002 Reserved for concepts with insufficient information to code with codable children: Secondary | ICD-10-CM

## 2019-05-05 DIAGNOSIS — E039 Hypothyroidism, unspecified: Secondary | ICD-10-CM | POA: Diagnosis not present

## 2019-05-05 DIAGNOSIS — E1165 Type 2 diabetes mellitus with hyperglycemia: Secondary | ICD-10-CM

## 2019-05-05 DIAGNOSIS — E118 Type 2 diabetes mellitus with unspecified complications: Secondary | ICD-10-CM | POA: Diagnosis not present

## 2019-05-05 DIAGNOSIS — I10 Essential (primary) hypertension: Secondary | ICD-10-CM | POA: Diagnosis not present

## 2019-05-05 DIAGNOSIS — E782 Mixed hyperlipidemia: Secondary | ICD-10-CM | POA: Diagnosis not present

## 2019-05-05 DIAGNOSIS — Z794 Long term (current) use of insulin: Secondary | ICD-10-CM | POA: Diagnosis not present

## 2019-05-05 DIAGNOSIS — E119 Type 2 diabetes mellitus without complications: Secondary | ICD-10-CM | POA: Diagnosis not present

## 2019-05-05 LAB — POCT GLYCOSYLATED HEMOGLOBIN (HGB A1C): Hemoglobin A1C: 7.4 % — AB (ref 4.0–5.6)

## 2019-05-05 MED ORDER — FREESTYLE LIBRE 14 DAY SENSOR MISC: 1.0000 | 2 refills | Status: DC

## 2019-05-05 MED ORDER — FREESTYLE LIBRE 14 DAY READER DEVI: 1.0000 | Freq: Once | 0 refills | Status: AC

## 2019-05-05 NOTE — Patient Instructions (Signed)

## 2019-05-05 NOTE — Progress Notes (Signed)
05/05/2019      Endocrinology follow-up note   Subjective:    Patient ID: Holly Baldwin, female    DOB: [birth year],    Past Medical History:  Diagnosis Date  . Anxiety   . Chronic abdominal pain   . Diabetes mellitus   . GERD (gastroesophageal reflux disease)   . Hiatal hernia   . Hypercholesteremia   . Hypertension    Past Surgical History:  Procedure Laterality Date  . ABDOMINAL HYSTERECTOMY    . COLONOSCOPY N/A 06/23/2012   Procedure: COLONOSCOPY;  Surgeon: Rogene Houston, MD;  Location: [facility];  Service: Endoscopy;  Laterality: N/A;  100-moved to 1200 Ann to notify pt  . ESOPHAGOGASTRODUODENOSCOPY N/A 05/06/2012   Procedure: ESOPHAGOGASTRODUODENOSCOPY (EGD);  Surgeon: Rogene Houston, MD;  Location: [facility];  Service: Endoscopy;  Laterality: N/A;  . MASS EXCISION Right 05/26/2012   Procedure: EXCISION NEOPLASM RIGHT THIGH;  Surgeon: Jamesetta So, MD;  Location: [facility];  Service: General;  Laterality: Right;   Social History   Socioeconomic History  . Marital status: Married    Spouse name: Not on file  . Number of children: Not on file  . Years of education: Not on file  . Highest education level: Not on file  Occupational History  . Not on file  Tobacco Use  . Smoking status: Never Smoker  . Smokeless tobacco: Never Used  Substance and Sexual Activity  . Alcohol use: No  . Drug use: No  . Sexual activity: Not on file  Other Topics Concern  . Not on file  Social History Narrative  . Not on file   Social Determinants of Health   Financial Resource Strain:   . Difficulty of Paying Living Expenses: Not on file  Food Insecurity: Food Insecurity Present  . Worried About Charity fundraiser in the Last Year: Sometimes true  . Ran Out of Food in the Last Year: Sometimes true  Transportation Needs: No Transportation Needs  . Lack of Transportation (Medical): No  . Lack of Transportation (Non-Medical): No  Physical Activity:   . Days of Exercise  per Week: Not on file  . Minutes of Exercise per Session: Not on file  Stress:   . Feeling of Stress : Not on file  Social Connections:   . Frequency of Communication with Friends and Family: Not on file  . Frequency of Social Gatherings with Friends and Family: Not on file  . Attends Religious Services: Not on file  . Active Member of Clubs or Organizations: Not on file  . Attends Archivist Meetings: Not on file  . Marital Status: Not on file   Outpatient Encounter Medications as of 05/05/2019  Medication Sig  . ACCU-CHEK AVIVA PLUS test strip TEST BLOOD GLUCOSE FOUR TIMES DAILY  . albuterol (PROVENTIL HFA;VENTOLIN HFA) 108 (90 Base) MCG/ACT inhaler Inhale into the lungs every 6 (six) hours as needed for wheezing or shortness of breath.  Marland Kitchen amLODipine (NORVASC) 10 MG tablet Take 1 tablet (10 mg total) by mouth daily.  Marland Kitchen aspirin EC 81 MG tablet Take 81 mg by mouth at bedtime.  . budesonide-formoterol (SYMBICORT) 160-4.5 MCG/ACT inhaler Inhale 2 puffs into the lungs 2 (two) times daily.  . capsaicin (ZOSTRIX) 0.025 % cream Apply topically 2 (two) times daily.  . carvedilol (COREG) 25 MG tablet Take 1 tablet (25 mg total) by mouth 2 (two) times daily with a meal.  . Cholecalciferol (VITAMIN D3) 125 MCG (  5000 UT) CAPS TAKE 1 CAPSULE EVERY DAY  . Continuous Blood Gluc Receiver (FREESTYLE LIBRE 14 DAY READER) DEVI 1 each by Does not apply route once for 1 dose.  . Continuous Blood Gluc Sensor (FREESTYLE LIBRE 14 DAY SENSOR) MISC Inject 1 each into the skin every 14 (fourteen) days. Use as directed.  . Continuous Blood Gluc Sensor (FREESTYLE LIBRE 14 DAY SENSOR) MISC Inject 1 each into the skin every 14 (fourteen) days. Use as directed.  . cycloSPORINE (RESTASIS) 0.05 % ophthalmic emulsion Place 1 drop into both eyes daily.  Marland Kitchen EPINEPHrine (EPI-PEN) 0.3 mg/0.3 mL DEVI Inject 0.3 mg into the muscle once.  Marland Kitchen FLUoxetine (PROZAC) 20 MG capsule Take 20 mg by mouth every morning.   .  furosemide (LASIX) 40 MG tablet Take 1 tablet (40 mg total) by mouth daily.  Marland Kitchen gabapentin (NEURONTIN) 100 MG capsule TAKE 1 CAPSULE TWICE DAILY  . hydrALAZINE (APRESOLINE) 100 MG tablet Take 1 tablet (100 mg total) by mouth 3 (three) times daily.  . insulin aspart (NOVOLOG) 100 UNIT/ML FlexPen Inject 10 Units into the skin 3 (three) times daily with meals.  . Insulin Glargine (LANTUS SOLOSTAR) 100 UNIT/ML Solostar Pen Inject 50 Units into the skin at bedtime. INJECT  50 UNITS SUBCUTANEOUSLY  AT  10PM  . isosorbide mononitrate (IMDUR) 60 MG 24 hr tablet Take 1 tablet (60 mg total) by mouth daily.  Marland Kitchen levothyroxine (SYNTHROID) 137 MCG tablet TAKE 1 TABLET (137 MCG TOTAL) BY MOUTH DAILY BEFORE BREAKFAST.  Marland Kitchen simvastatin (ZOCOR) 40 MG tablet TAKE 1 TABLET EVERY DAY  . topiramate (TOPAMAX) 100 MG tablet Take 1 tablet by mouth 2 (two) times daily.  . ziprasidone (GEODON) 80 MG capsule Take 80 mg by mouth at bedtime.     No facility-administered encounter medications on file as of 05/05/2019.   ALLERGIES: Allergies  Allergen Reactions  . Bee Venom Anaphylaxis  . Ace Inhibitors   . Penicillins Hives    .Has patient had a PCN reaction causing immediate rash, facial/tongue/throat swelling, SOB or lightheadedness with hypotension: Yes Has patient had a PCN reaction causing severe rash involving mucus membranes or skin necrosis: No Has patient had a PCN reaction that required hospitalization: Yes Has patient had a PCN reaction occurring within the last 10 years: No If all of the above answers are "NO", then may proceed with Cephalosporin use.    VACCINATION STATUS:  There is no immunization history on file for this patient.  Diabetes She presents for her follow-up diabetic visit. She has type 2 diabetes mellitus. Onset time: she was diagnosed at approximate age of 83 years. Her disease course has been worsening. There are no hypoglycemic associated symptoms. Pertinent negatives for hypoglycemia  include no confusion, headaches, pallor or seizures. Pertinent negatives for diabetes include no chest pain, no fatigue, no polydipsia, no polyphagia and no polyuria. There are no hypoglycemic complications. Symptoms are worsening. Diabetic complications include autonomic neuropathy. Risk factors for coronary artery disease include diabetes mellitus, dyslipidemia, obesity and sedentary lifestyle. Her weight is increasing steadily. She is following a generally unhealthy diet. When asked about meal planning, she reported none. She has not had a previous visit with a dietitian. She never participates in exercise. Her home blood glucose trend is fluctuating minimally. Her breakfast blood glucose range is generally 140-180 mg/dl. Her lunch blood glucose range is generally 140-180 mg/dl. Her dinner blood glucose range is generally 140-180 mg/dl. Her bedtime blood glucose range is generally 140-180 mg/dl. Her overall blood glucose  range is 140-180 mg/dl. An ACE inhibitor/angiotensin II receptor blocker is being taken.  Thyroid Problem Presents for follow-up (She is currently on levothyroxine 137 mcg p.o. nightly.  She reports compliance with medication.) visit. Patient reports no cold intolerance, diarrhea, fatigue, heat intolerance or palpitations. The symptoms have been stable. Past treatments include levothyroxine. Her past medical history is significant for diabetes and hyperlipidemia.  Hyperlipidemia This is a chronic problem. The current episode started more than 1 year ago. Exacerbating diseases include diabetes and obesity. Pertinent negatives include no chest pain, myalgias or shortness of breath. Current antihyperlipidemic treatment includes statins. Risk factors for coronary artery disease include dyslipidemia, diabetes mellitus, hypertension, obesity, a sedentary lifestyle and post-menopausal.  Hypertension This is a chronic problem. The current episode started more than 1 year ago. The problem is  uncontrolled (Patient's pressure this morning was extremely high reading 243/81 and repeat measurement was 241/72.  Patient is asymptomatic.  Patient was sent with a note to emergency room for evaluation and treatment.). Pertinent negatives include no chest pain, headaches, palpitations or shortness of breath. Risk factors for coronary artery disease include dyslipidemia, diabetes mellitus, obesity, post-menopausal state and sedentary lifestyle. Past treatments include angiotensin blockers, calcium channel blockers and central alpha agonists. Identifiable causes of hypertension include a thyroid problem.     Review of systems  Constitutional: + Minimally fluctuating body weight,  current  Body mass index is 36.63 kg/m. , no fatigue, no subjective hyperthermia, no subjective hypothermia Eyes: no blurry vision, no xerophthalmia ENT: no sore throat, no nodules palpated in throat, no dysphagia/odynophagia, no hoarseness Cardiovascular: no Chest Pain, no Shortness of Breath, no palpitations, no leg swelling Respiratory: no cough, no shortness of breath Gastrointestinal: no Nausea/Vomiting/Diarhhea Musculoskeletal: no muscle/joint aches Skin: no rashes, no hyperemia Neurological: no tremors, no numbness, no tingling, no dizziness Psychiatric: no depression, no anxiety   Objective:    BP (!) 145/68   Pulse (!) 53   Ht 5\' 4"  (1.626 m)   Wt 213 lb 6.4 oz (96.8 kg)   BMI 36.63 kg/m   Wt Readings from Last 3 Encounters:  05/05/19 213 lb 6.4 oz (96.8 kg)  01/07/19 213 lb (96.6 kg)  08/26/18 200 lb (90.7 kg)     Physical Exam- Limited  Constitutional:  Body mass index is 36.63 kg/m. , not in acute distress, normal state of mind Eyes:  EOMI, no exophthalmos Neck: Supple Thyroid: No gross goiter Respiratory: Adequate breathing efforts Musculoskeletal: no gross deformities, strength intact in all four extremities, no gross restriction of joint movements Skin:  no rashes, no  hyperemia Neurological: no tremor with outstretched hands,    Results for orders placed or performed in visit on 05/05/19  HgB A1c  Result Value Ref Range   Hemoglobin A1C 7.4 (A) 4.0 - 5.6 %   HbA1c POC (<> result, manual entry)     HbA1c, POC (prediabetic range)     HbA1c, POC (controlled diabetic range)     Diabetic Labs (most recent): Lab Results  Component Value Date   HGBA1C 7.4 (A) 05/05/2019   HGBA1C 6.7 (A) 01/07/2019   HGBA1C 7.1 (H) 08/21/2018   Lipid Panel     Component Value Date/Time   CHOL 162 08/21/2018 0450   TRIG 103 08/21/2018 0450   HDL 72 08/21/2018 0450   CHOLHDL 2.3 08/21/2018 0450   VLDL 21 08/21/2018 0450   LDLCALC 69 08/21/2018 0450   LDLCALC 105 (H) 12/14/2017 0921     Assessment & Plan:  1. Uncontrolled type 2 diabetes mellitus with complication Her diabetes is  complicated by CAD,  neuropathy , microalbuminuria, worsening CKD,  and patient remains at a high risk for more acute and chronic complications of diabetes which include CAD, CVA, CKD, retinopathy, and neuropathy. These are all discussed in detail with the patient.  She presents with slightly above target glycemic profile both fasting and postprandial, A1c of 7.4% increasing from 6.7% during her last visit.  She has no documented or reported hypoglycemia.     - I have re-counseled the patient on diet management and weight loss  by adopting a carbohydrate restricted / protein rich  Diet.  - she  admits there is a room for improvement in her diet and drink choices. -  Suggestion is made for her to avoid simple carbohydrates  from her diet including Cakes, Sweet Desserts / Pastries, Ice Cream, Soda (diet and regular), Sweet Tea, Candies, Chips, Cookies, Sweet Pastries,  Store Bought Juices, Alcohol in Excess of  1-2 drinks a day, Artificial Sweeteners, Coffee Creamer, and "Sugar-free" Products. This will help patient to have stable blood glucose profile and potentially avoid unintended  weight gain.   - Patient is advised to stick to a routine mealtimes to eat 3 meals  a day and avoid unnecessary snacks ( to snack only to correct hypoglycemia).  - I have approached patient with the following individualized plan to manage diabetes and patient agrees.  -She reports near target glycemic profile, no hypoglycemia nor major hypoglycemia.    She will continue to require intensive treatment with basal/bolus insulin in order for her to achieve and maintain control of diabetes to target.    -She is advised to continue Lantus 50 units nightly,  advised to continue NovoLog 10 units 3 times daily AC for premeal blood glucose readings above 90 mg per DL.   -She is advised to avoid correction due to the fact that it causes confusion to her.    She is advised to continue monitoring blood glucose 4 times a day-before meals and at bedtime .  -Patient is encouraged to call clinic for blood glucose levels less than 70 or above 300 mg /dl. -She will benefit from continuous glucose monitoring.  I discussed and prescribed the freestyle libre device for her.  - Patient specific target  for A1c; LDL, HDL, Triglycerides, and  Waist Circumference were discussed in detail.  2) BP/HTN: Her blood pressure not controlled to target.   She has adequate medications, advised to be consistent and continue her current medications including hydralazine 100 mg 3 times a day, torsemide as needed, amlodipine 10 mg p.o. daily, carvedilol 25 mg p.o. twice daily, Imdur 60 mg p.o. daily.   3) Lipids/HPL: Her recent lipid panel showed improved LDL at 105 from 127.  She is advised to continue pravastatin 40 mg p.o. nightly .    Side effects and precautions discussed with her.   4)  Weight/Diet: CDE consult in progress, exercise, and carbohydrates information provided.  5) hypothyroidism: -Her previsit thyroid function tests are consistent with appropriate replacement.  -She is advised to continue levothyroxine 137  mcg p.o. daily before breakfast.    - We discussed about the correct intake of her thyroid hormone, on empty stomach at fasting, with water, separated by at least 30 minutes from breakfast and other medications,  and separated by more than 4 hours from calcium, iron, multivitamins, acid reflux medications (PPIs). -Patient is made aware of the fact that thyroid hormone  replacement is needed for life, dose to be adjusted by periodic monitoring of thyroid function tests.   6) Chronic Care/Health Maintenance:  -Patient is on ACEI/ARB and Statin medications and encouraged to continue to follow up with Ophthalmology, Podiatrist at least yearly or according to recommendations, and advised to  stay away from smoking. I have recommended yearly flu vaccine and pneumonia vaccination at least every 5 years; moderate intensity exercise for up to 150 minutes weekly; and  sleep for at least 7 hours a day.  I advised patient to maintain close follow up with her PCP for primary care needs.  - Time spent on this patient care encounter:  35 min, of which > 50% was spent in  counseling and the rest reviewing her blood glucose logs , discussing her hypoglycemia and hyperglycemia episodes, reviewing her current and  previous labs / studies  ( including abstraction from other facilities) and medications  doses and developing a  long term treatment plan and documenting her care.   Please refer to Patient Instructions for Blood Glucose Monitoring and Insulin/Medications Dosing Guide"  in media tab for additional information. Please  also refer to " Patient Self Inventory" in the Media  tab for reviewed elements of pertinent patient history.  Holly Baldwin participated in the discussions, expressed understanding, and voiced agreement with the above plans.  All questions were answered to her satisfaction. she is encouraged to contact clinic should she have any questions or concerns prior to her return visit.    Follow up  plan: Return in about 3 months (around 08/05/2019) for Bring Meter and Logs- A1c in Office, Follow up with Pre-visit Labs.  Glade Lloyd, MD Phone: [phone]  Fax: [phone]  This note was partially dictated with voice recognition software. Similar sounding words can be transcribed inadequately or may not  be corrected upon review.  05/05/2019, 4:41 PM

## 2019-05-18 DIAGNOSIS — Z0001 Encounter for general adult medical examination with abnormal findings: Secondary | ICD-10-CM | POA: Diagnosis not present

## 2019-05-18 DIAGNOSIS — N189 Chronic kidney disease, unspecified: Secondary | ICD-10-CM | POA: Diagnosis not present

## 2019-05-18 DIAGNOSIS — Z681 Body mass index (BMI) 19 or less, adult: Secondary | ICD-10-CM | POA: Diagnosis not present

## 2019-05-18 DIAGNOSIS — Z1389 Encounter for screening for other disorder: Secondary | ICD-10-CM | POA: Diagnosis not present

## 2019-05-18 DIAGNOSIS — E7849 Other hyperlipidemia: Secondary | ICD-10-CM | POA: Diagnosis not present

## 2019-05-18 DIAGNOSIS — E039 Hypothyroidism, unspecified: Secondary | ICD-10-CM | POA: Diagnosis not present

## 2019-05-18 DIAGNOSIS — E1129 Type 2 diabetes mellitus with other diabetic kidney complication: Secondary | ICD-10-CM | POA: Diagnosis not present

## 2019-05-18 DIAGNOSIS — E119 Type 2 diabetes mellitus without complications: Secondary | ICD-10-CM | POA: Diagnosis not present

## 2019-05-19 ENCOUNTER — Other Ambulatory Visit: Payer: Self-pay

## 2019-05-19 ENCOUNTER — Encounter (INDEPENDENT_AMBULATORY_CARE_PROVIDER_SITE_OTHER): Payer: Medicare HMO | Admitting: Ophthalmology

## 2019-05-19 DIAGNOSIS — E11311 Type 2 diabetes mellitus with unspecified diabetic retinopathy with macular edema: Secondary | ICD-10-CM | POA: Diagnosis not present

## 2019-05-19 DIAGNOSIS — E113512 Type 2 diabetes mellitus with proliferative diabetic retinopathy with macular edema, left eye: Secondary | ICD-10-CM | POA: Diagnosis not present

## 2019-05-19 DIAGNOSIS — H43813 Vitreous degeneration, bilateral: Secondary | ICD-10-CM | POA: Diagnosis not present

## 2019-05-19 DIAGNOSIS — H35033 Hypertensive retinopathy, bilateral: Secondary | ICD-10-CM

## 2019-05-19 DIAGNOSIS — I10 Essential (primary) hypertension: Secondary | ICD-10-CM

## 2019-05-19 DIAGNOSIS — E113311 Type 2 diabetes mellitus with moderate nonproliferative diabetic retinopathy with macular edema, right eye: Secondary | ICD-10-CM | POA: Diagnosis not present

## 2019-06-03 DIAGNOSIS — N189 Chronic kidney disease, unspecified: Secondary | ICD-10-CM | POA: Diagnosis not present

## 2019-06-03 DIAGNOSIS — I5032 Chronic diastolic (congestive) heart failure: Secondary | ICD-10-CM | POA: Diagnosis not present

## 2019-06-03 DIAGNOSIS — Z79899 Other long term (current) drug therapy: Secondary | ICD-10-CM | POA: Diagnosis not present

## 2019-06-03 DIAGNOSIS — E1129 Type 2 diabetes mellitus with other diabetic kidney complication: Secondary | ICD-10-CM | POA: Diagnosis not present

## 2019-06-03 DIAGNOSIS — E1122 Type 2 diabetes mellitus with diabetic chronic kidney disease: Secondary | ICD-10-CM | POA: Diagnosis not present

## 2019-06-03 DIAGNOSIS — R809 Proteinuria, unspecified: Secondary | ICD-10-CM | POA: Diagnosis not present

## 2019-06-03 DIAGNOSIS — I129 Hypertensive chronic kidney disease with stage 1 through stage 4 chronic kidney disease, or unspecified chronic kidney disease: Secondary | ICD-10-CM | POA: Diagnosis not present

## 2019-06-03 DIAGNOSIS — E559 Vitamin D deficiency, unspecified: Secondary | ICD-10-CM | POA: Diagnosis not present

## 2019-06-03 DIAGNOSIS — D631 Anemia in chronic kidney disease: Secondary | ICD-10-CM | POA: Diagnosis not present

## 2019-06-07 LAB — ALDOSTERONE + RENIN ACTIVITY W/ RATIO
ALDO / PRA Ratio: >17.4 (ref 0.0–30.0)
Aldosterone: 2.9 ng/dL (ref 0.0–30.0)
PRA LC/MS/MS: <0.167 ng/mL/hr — ABNORMAL LOW (ref 0.167–5.380)

## 2019-06-14 ENCOUNTER — Other Ambulatory Visit (HOSPITAL_COMMUNITY): Payer: Self-pay | Admitting: Nephrology

## 2019-06-14 ENCOUNTER — Other Ambulatory Visit: Payer: Self-pay | Admitting: Nephrology

## 2019-06-14 DIAGNOSIS — N189 Chronic kidney disease, unspecified: Secondary | ICD-10-CM

## 2019-06-16 ENCOUNTER — Encounter (INDEPENDENT_AMBULATORY_CARE_PROVIDER_SITE_OTHER): Payer: Medicare HMO | Admitting: Ophthalmology

## 2019-06-21 ENCOUNTER — Encounter (HOSPITAL_COMMUNITY): Payer: Self-pay

## 2019-06-21 ENCOUNTER — Other Ambulatory Visit (HOSPITAL_COMMUNITY): Payer: Medicare HMO

## 2019-06-24 ENCOUNTER — Ambulatory Visit (HOSPITAL_COMMUNITY)
Admission: RE | Admit: 2019-06-24 | Discharge: 2019-06-24 | Disposition: A | Discharge status: Home / Self Care | Payer: Medicare HMO | Source: Ambulatory Visit | Attending: Nephrology | Admitting: Nephrology

## 2019-06-24 ENCOUNTER — Other Ambulatory Visit: Payer: Self-pay

## 2019-06-24 DIAGNOSIS — N189 Chronic kidney disease, unspecified: Secondary | ICD-10-CM

## 2019-06-27 DIAGNOSIS — E559 Vitamin D deficiency, unspecified: Secondary | ICD-10-CM | POA: Diagnosis not present

## 2019-06-27 DIAGNOSIS — D631 Anemia in chronic kidney disease: Secondary | ICD-10-CM | POA: Diagnosis not present

## 2019-06-27 DIAGNOSIS — I5032 Chronic diastolic (congestive) heart failure: Secondary | ICD-10-CM | POA: Diagnosis not present

## 2019-06-27 DIAGNOSIS — E1122 Type 2 diabetes mellitus with diabetic chronic kidney disease: Secondary | ICD-10-CM | POA: Diagnosis not present

## 2019-06-27 DIAGNOSIS — R809 Proteinuria, unspecified: Secondary | ICD-10-CM | POA: Diagnosis not present

## 2019-06-27 DIAGNOSIS — E1129 Type 2 diabetes mellitus with other diabetic kidney complication: Secondary | ICD-10-CM | POA: Diagnosis not present

## 2019-06-27 DIAGNOSIS — N189 Chronic kidney disease, unspecified: Secondary | ICD-10-CM | POA: Diagnosis not present

## 2019-06-27 DIAGNOSIS — I129 Hypertensive chronic kidney disease with stage 1 through stage 4 chronic kidney disease, or unspecified chronic kidney disease: Secondary | ICD-10-CM | POA: Diagnosis not present

## 2019-06-27 DIAGNOSIS — Z79899 Other long term (current) drug therapy: Secondary | ICD-10-CM | POA: Diagnosis not present

## 2019-07-06 DIAGNOSIS — Z681 Body mass index (BMI) 19 or less, adult: Secondary | ICD-10-CM | POA: Diagnosis not present

## 2019-07-06 DIAGNOSIS — T461X5A Adverse effect of calcium-channel blockers, initial encounter: Secondary | ICD-10-CM | POA: Diagnosis not present

## 2019-07-06 DIAGNOSIS — M7989 Other specified soft tissue disorders: Secondary | ICD-10-CM | POA: Diagnosis not present

## 2019-07-07 DIAGNOSIS — Z681 Body mass index (BMI) 19 or less, adult: Secondary | ICD-10-CM | POA: Diagnosis not present

## 2019-07-07 DIAGNOSIS — T461X5A Adverse effect of calcium-channel blockers, initial encounter: Secondary | ICD-10-CM | POA: Diagnosis not present

## 2019-07-13 DIAGNOSIS — R809 Proteinuria, unspecified: Secondary | ICD-10-CM | POA: Diagnosis not present

## 2019-07-13 DIAGNOSIS — D631 Anemia in chronic kidney disease: Secondary | ICD-10-CM | POA: Diagnosis not present

## 2019-07-13 DIAGNOSIS — E1129 Type 2 diabetes mellitus with other diabetic kidney complication: Secondary | ICD-10-CM | POA: Diagnosis not present

## 2019-07-13 DIAGNOSIS — I129 Hypertensive chronic kidney disease with stage 1 through stage 4 chronic kidney disease, or unspecified chronic kidney disease: Secondary | ICD-10-CM | POA: Diagnosis not present

## 2019-07-13 DIAGNOSIS — E1122 Type 2 diabetes mellitus with diabetic chronic kidney disease: Secondary | ICD-10-CM | POA: Diagnosis not present

## 2019-07-13 DIAGNOSIS — N189 Chronic kidney disease, unspecified: Secondary | ICD-10-CM | POA: Diagnosis not present

## 2019-07-13 DIAGNOSIS — E211 Secondary hyperparathyroidism, not elsewhere classified: Secondary | ICD-10-CM | POA: Diagnosis not present

## 2019-07-13 DIAGNOSIS — R768 Other specified abnormal immunological findings in serum: Secondary | ICD-10-CM | POA: Diagnosis not present

## 2019-07-13 DIAGNOSIS — I5032 Chronic diastolic (congestive) heart failure: Secondary | ICD-10-CM | POA: Diagnosis not present

## 2019-07-27 DIAGNOSIS — E1122 Type 2 diabetes mellitus with diabetic chronic kidney disease: Secondary | ICD-10-CM | POA: Diagnosis not present

## 2019-07-27 DIAGNOSIS — I5032 Chronic diastolic (congestive) heart failure: Secondary | ICD-10-CM | POA: Diagnosis not present

## 2019-07-27 DIAGNOSIS — N189 Chronic kidney disease, unspecified: Secondary | ICD-10-CM | POA: Diagnosis not present

## 2019-07-27 DIAGNOSIS — E211 Secondary hyperparathyroidism, not elsewhere classified: Secondary | ICD-10-CM | POA: Diagnosis not present

## 2019-07-27 DIAGNOSIS — R809 Proteinuria, unspecified: Secondary | ICD-10-CM | POA: Diagnosis not present

## 2019-07-27 DIAGNOSIS — D631 Anemia in chronic kidney disease: Secondary | ICD-10-CM | POA: Diagnosis not present

## 2019-07-27 DIAGNOSIS — R768 Other specified abnormal immunological findings in serum: Secondary | ICD-10-CM | POA: Diagnosis not present

## 2019-07-27 DIAGNOSIS — I129 Hypertensive chronic kidney disease with stage 1 through stage 4 chronic kidney disease, or unspecified chronic kidney disease: Secondary | ICD-10-CM | POA: Diagnosis not present

## 2019-07-27 DIAGNOSIS — E1129 Type 2 diabetes mellitus with other diabetic kidney complication: Secondary | ICD-10-CM | POA: Diagnosis not present

## 2019-08-01 DIAGNOSIS — I5032 Chronic diastolic (congestive) heart failure: Secondary | ICD-10-CM | POA: Diagnosis not present

## 2019-08-01 DIAGNOSIS — N184 Chronic kidney disease, stage 4 (severe): Secondary | ICD-10-CM | POA: Diagnosis not present

## 2019-08-01 DIAGNOSIS — E1122 Type 2 diabetes mellitus with diabetic chronic kidney disease: Secondary | ICD-10-CM | POA: Diagnosis not present

## 2019-08-01 DIAGNOSIS — I130 Hypertensive heart and chronic kidney disease with heart failure and stage 1 through stage 4 chronic kidney disease, or unspecified chronic kidney disease: Secondary | ICD-10-CM | POA: Diagnosis not present

## 2019-08-03 DIAGNOSIS — E870 Hyperosmolality and hypernatremia: Secondary | ICD-10-CM | POA: Diagnosis not present

## 2019-08-03 DIAGNOSIS — E1129 Type 2 diabetes mellitus with other diabetic kidney complication: Secondary | ICD-10-CM | POA: Diagnosis not present

## 2019-08-03 DIAGNOSIS — N189 Chronic kidney disease, unspecified: Secondary | ICD-10-CM | POA: Diagnosis not present

## 2019-08-03 DIAGNOSIS — I129 Hypertensive chronic kidney disease with stage 1 through stage 4 chronic kidney disease, or unspecified chronic kidney disease: Secondary | ICD-10-CM | POA: Diagnosis not present

## 2019-08-03 DIAGNOSIS — E211 Secondary hyperparathyroidism, not elsewhere classified: Secondary | ICD-10-CM | POA: Diagnosis not present

## 2019-08-03 DIAGNOSIS — E1122 Type 2 diabetes mellitus with diabetic chronic kidney disease: Secondary | ICD-10-CM | POA: Diagnosis not present

## 2019-08-03 DIAGNOSIS — D631 Anemia in chronic kidney disease: Secondary | ICD-10-CM | POA: Diagnosis not present

## 2019-08-03 DIAGNOSIS — I5032 Chronic diastolic (congestive) heart failure: Secondary | ICD-10-CM | POA: Diagnosis not present

## 2019-08-03 DIAGNOSIS — R809 Proteinuria, unspecified: Secondary | ICD-10-CM | POA: Diagnosis not present

## 2019-08-09 ENCOUNTER — Other Ambulatory Visit: Payer: Self-pay

## 2019-08-09 ENCOUNTER — Ambulatory Visit (INDEPENDENT_AMBULATORY_CARE_PROVIDER_SITE_OTHER): Payer: Medicare HMO | Admitting: "Endocrinology

## 2019-08-09 ENCOUNTER — Encounter: Payer: Self-pay | Admitting: "Endocrinology

## 2019-08-09 ENCOUNTER — Ambulatory Visit: Payer: Medicare HMO | Admitting: "Endocrinology

## 2019-08-09 VITALS — BP 119/66 | HR 52 | Ht 64.0 in | Wt 204.6 lb

## 2019-08-09 DIAGNOSIS — IMO0002 Reserved for concepts with insufficient information to code with codable children: Secondary | ICD-10-CM

## 2019-08-09 DIAGNOSIS — E039 Hypothyroidism, unspecified: Secondary | ICD-10-CM | POA: Diagnosis not present

## 2019-08-09 DIAGNOSIS — E1165 Type 2 diabetes mellitus with hyperglycemia: Secondary | ICD-10-CM | POA: Diagnosis not present

## 2019-08-09 DIAGNOSIS — Z794 Long term (current) use of insulin: Secondary | ICD-10-CM

## 2019-08-09 DIAGNOSIS — I10 Essential (primary) hypertension: Secondary | ICD-10-CM

## 2019-08-09 DIAGNOSIS — E118 Type 2 diabetes mellitus with unspecified complications: Secondary | ICD-10-CM

## 2019-08-09 DIAGNOSIS — E119 Type 2 diabetes mellitus without complications: Secondary | ICD-10-CM | POA: Diagnosis not present

## 2019-08-09 LAB — POCT GLYCOSYLATED HEMOGLOBIN (HGB A1C): Hemoglobin A1C: 6.4 % — AB (ref 4.0–5.6)

## 2019-08-09 MED ORDER — LANTUS SOLOSTAR 100 UNIT/ML ~~LOC~~ SOPN: 40.0000 [IU] | PEN_INJECTOR | Freq: Every day | SUBCUTANEOUS | 2 refills | Status: DC

## 2019-08-09 NOTE — Patient Instructions (Signed)

## 2019-08-09 NOTE — Progress Notes (Signed)
08/09/2019      Endocrinology follow-up note   Subjective:    Patient ID: Holly Baldwin, female    DOB: [birth year],    Past Medical History:  Diagnosis Date  . Anxiety   . Chronic abdominal pain   . Diabetes mellitus   . GERD (gastroesophageal reflux disease)   . Hiatal hernia   . Hypercholesteremia   . Hypertension    Past Surgical History:  Procedure Laterality Date  . ABDOMINAL HYSTERECTOMY    . COLONOSCOPY N/A 06/23/2012   Procedure: COLONOSCOPY;  Surgeon: Rogene Houston, MD;  Location: [facility];  Service: Endoscopy;  Laterality: N/A;  100-moved to 1200 Ann to notify pt  . ESOPHAGOGASTRODUODENOSCOPY N/A 05/06/2012   Procedure: ESOPHAGOGASTRODUODENOSCOPY (EGD);  Surgeon: Rogene Houston, MD;  Location: [facility];  Service: Endoscopy;  Laterality: N/A;  . MASS EXCISION Right 05/26/2012   Procedure: EXCISION NEOPLASM RIGHT THIGH;  Surgeon: Jamesetta So, MD;  Location: [facility];  Service: General;  Laterality: Right;   Social History   Socioeconomic History  . Marital status: Married    Spouse name: Not on file  . Number of children: Not on file  . Years of education: Not on file  . Highest education level: Not on file  Occupational History  . Not on file  Tobacco Use  . Smoking status: Never Smoker  . Smokeless tobacco: Never Used  Substance and Sexual Activity  . Alcohol use: No  . Drug use: No  . Sexual activity: Not on file  Other Topics Concern  . Not on file  Social History Narrative  . Not on file   Social Determinants of Health   Financial Resource Strain:   . Difficulty of Paying Living Expenses:   Food Insecurity: Food Insecurity Present  . Worried About Charity fundraiser in the Last Year: Sometimes true  . Ran Out of Food in the Last Year: Sometimes true  Transportation Needs: No Transportation Needs  . Lack of Transportation (Medical): No  . Lack of Transportation (Non-Medical): No  Physical Activity:   . Days of Exercise per Week:    . Minutes of Exercise per Session:   Stress:   . Feeling of Stress :   Social Connections:   . Frequency of Communication with Friends and Family:   . Frequency of Social Gatherings with Friends and Family:   . Attends Religious Services:   . Active Member of Clubs or Organizations:   . Attends Archivist Meetings:   Marland Kitchen Marital Status:    Outpatient Encounter Medications as of 08/09/2019  Medication Sig  . ACCU-CHEK AVIVA PLUS test strip TEST BLOOD GLUCOSE FOUR TIMES DAILY  . albuterol (PROVENTIL HFA;VENTOLIN HFA) 108 (90 Base) MCG/ACT inhaler Inhale into the lungs every 6 (six) hours as needed for wheezing or shortness of breath.  Marland Kitchen amLODipine (NORVASC) 10 MG tablet Take 1 tablet (10 mg total) by mouth daily.  Marland Kitchen aspirin EC 81 MG tablet Take 81 mg by mouth at bedtime.  . budesonide-formoterol (SYMBICORT) 160-4.5 MCG/ACT inhaler Inhale 2 puffs into the lungs 2 (two) times daily.  . capsaicin (ZOSTRIX) 0.025 % cream Apply topically 2 (two) times daily.  . carvedilol (COREG) 25 MG tablet Take 1 tablet (25 mg total) by mouth 2 (two) times daily with a meal.  . Cholecalciferol (VITAMIN D3) 125 MCG (5000 UT) CAPS TAKE 1 CAPSULE EVERY DAY  . Continuous Blood Gluc Sensor (FREESTYLE LIBRE 14 DAY SENSOR) MISC  Inject 1 each into the skin every 14 (fourteen) days. Use as directed.  . Continuous Blood Gluc Sensor (FREESTYLE LIBRE 14 DAY SENSOR) MISC Inject 1 each into the skin every 14 (fourteen) days. Use as directed.  . cycloSPORINE (RESTASIS) 0.05 % ophthalmic emulsion Place 1 drop into both eyes daily.  Marland Kitchen EPINEPHrine (EPI-PEN) 0.3 mg/0.3 mL DEVI Inject 0.3 mg into the muscle once.  Marland Kitchen FLUoxetine (PROZAC) 20 MG capsule Take 20 mg by mouth every morning.   . furosemide (LASIX) 40 MG tablet Take 1 tablet (40 mg total) by mouth daily.  Marland Kitchen gabapentin (NEURONTIN) 100 MG capsule TAKE 1 CAPSULE TWICE DAILY  . hydrALAZINE (APRESOLINE) 100 MG tablet Take 1 tablet (100 mg total) by mouth 3 (three)  times daily.  . insulin aspart (NOVOLOG) 100 UNIT/ML FlexPen Inject 10 Units into the skin 3 (three) times daily with meals.  . insulin glargine (LANTUS SOLOSTAR) 100 UNIT/ML Solostar Pen Inject 40 Units into the skin at bedtime. INJECT  50 UNITS SUBCUTANEOUSLY  AT  10PM  . isosorbide mononitrate (IMDUR) 60 MG 24 hr tablet Take 1 tablet (60 mg total) by mouth daily.  Marland Kitchen levothyroxine (SYNTHROID) 137 MCG tablet TAKE 1 TABLET (137 MCG TOTAL) BY MOUTH DAILY BEFORE BREAKFAST.  Marland Kitchen simvastatin (ZOCOR) 40 MG tablet TAKE 1 TABLET EVERY DAY  . topiramate (TOPAMAX) 100 MG tablet Take 1 tablet by mouth 2 (two) times daily.  . ziprasidone (GEODON) 80 MG capsule Take 80 mg by mouth at bedtime.    . [DISCONTINUED] Insulin Glargine (LANTUS SOLOSTAR) 100 UNIT/ML Solostar Pen Inject 50 Units into the skin at bedtime. INJECT  50 UNITS SUBCUTANEOUSLY  AT  10PM   No facility-administered encounter medications on file as of 08/09/2019.   ALLERGIES: Allergies  Allergen Reactions  . Bee Venom Anaphylaxis  . Ace Inhibitors   . Penicillins Hives    .Has patient had a PCN reaction causing immediate rash, facial/tongue/throat swelling, SOB or lightheadedness with hypotension: Yes Has patient had a PCN reaction causing severe rash involving mucus membranes or skin necrosis: No Has patient had a PCN reaction that required hospitalization: Yes Has patient had a PCN reaction occurring within the last 10 years: No If all of the above answers are "NO", then may proceed with Cephalosporin use.    VACCINATION STATUS:  There is no immunization history on file for this patient.  Diabetes She presents for her follow-up diabetic visit. She has type 2 diabetes mellitus. Onset time: she was diagnosed at approximate age of 77 years. Her disease course has been improving. There are no hypoglycemic associated symptoms. Pertinent negatives for hypoglycemia include no confusion, headaches, pallor or seizures. Pertinent negatives for  diabetes include no chest pain, no fatigue, no polydipsia, no polyphagia and no polyuria. There are no hypoglycemic complications. Symptoms are improving. Diabetic complications include autonomic neuropathy. Risk factors for coronary artery disease include diabetes mellitus, dyslipidemia, obesity and sedentary lifestyle. Her weight is decreasing steadily. She is following a generally unhealthy diet. When asked about meal planning, she reported none. She has not had a previous visit with a dietitian. She never participates in exercise. Her home blood glucose trend is decreasing steadily. Her breakfast blood glucose range is generally 130-140 mg/dl. Her lunch blood glucose range is generally 130-140 mg/dl. Her dinner blood glucose range is generally 130-140 mg/dl. Her bedtime blood glucose range is generally 130-140 mg/dl. Her overall blood glucose range is 130-140 mg/dl. (She presents with improving glycemic profile both fasting and postprandial.  Her point-of-care A1c 6.4%.  She made dosing error on her NovoLog.) An ACE inhibitor/angiotensin II receptor blocker is being taken.  Thyroid Problem Presents for follow-up (She is currently on levothyroxine 137 mcg p.o. nightly.  She reports compliance with medication.) visit. Patient reports no cold intolerance, diarrhea, fatigue, heat intolerance or palpitations. The symptoms have been stable. Past treatments include levothyroxine. Her past medical history is significant for diabetes and hyperlipidemia.  Hyperlipidemia This is a chronic problem. The current episode started more than 1 year ago. Exacerbating diseases include diabetes and obesity. Pertinent negatives include no chest pain, myalgias or shortness of breath. Current antihyperlipidemic treatment includes statins. Risk factors for coronary artery disease include dyslipidemia, diabetes mellitus, hypertension, obesity, a sedentary lifestyle and post-menopausal.  Hypertension This is a chronic problem. The  current episode started more than 1 year ago. The problem is uncontrolled (Patient's pressure this morning was extremely high reading 243/81 and repeat measurement was 241/72.  Patient is asymptomatic.  Patient was sent with a note to emergency room for evaluation and treatment.). Pertinent negatives include no chest pain, headaches, palpitations or shortness of breath. Risk factors for coronary artery disease include dyslipidemia, diabetes mellitus, obesity, post-menopausal state and sedentary lifestyle. Past treatments include angiotensin blockers, calcium channel blockers and central alpha agonists. Identifiable causes of hypertension include a thyroid problem.      Review of systems  Constitutional: + Lost 10 pounds since last visit,  current  Body mass index is 35.12 kg/m. , no fatigue, no subjective hyperthermia, no subjective hypothermia Eyes: no blurry vision, no xerophthalmia ENT: no sore throat, no nodules palpated in throat, no dysphagia/odynophagia, no hoarseness Cardiovascular: no Chest Pain, no Shortness of Breath, no palpitations, no leg swelling Respiratory: no cough, no shortness of breath Gastrointestinal: no Nausea/Vomiting/Diarhhea Musculoskeletal: no muscle/joint aches Skin: no rashes, no hyperemia Neurological: no tremors, no numbness, no tingling, no dizziness Psychiatric: no depression, no anxiety    Objective:    BP 119/66   Pulse (!) 52   Ht 5\' 4"  (1.626 m)   Wt 204 lb 9.6 oz (92.8 kg)   BMI 35.12 kg/m   Wt Readings from Last 3 Encounters:  08/09/19 204 lb 9.6 oz (92.8 kg)  05/05/19 213 lb 6.4 oz (96.8 kg)  01/07/19 213 lb (96.6 kg)     Physical Exam- Limited  Constitutional:  Body mass index is 35.12 kg/m. , not in acute distress, normal state of mind Eyes:  EOMI, no exophthalmos Neck: Supple Thyroid: No gross goiter Respiratory: Adequate breathing efforts Musculoskeletal: no gross deformities, strength intact in all four extremities, no gross  restriction of joint movements Skin:  no rashes, no hyperemia Neurological: no tremor with outstretched hands,     Results for orders placed or performed in visit on 08/09/19  HgB A1c  Result Value Ref Range   Hemoglobin A1C 6.4 (A) 4.0 - 5.6 %   HbA1c POC (<> result, manual entry)     HbA1c, POC (prediabetic range)     HbA1c, POC (controlled diabetic range)     Diabetic Labs (most recent): Lab Results  Component Value Date   HGBA1C 6.4 (A) 08/09/2019   HGBA1C 7.4 (A) 05/05/2019   HGBA1C 6.7 (A) 01/07/2019   Lipid Panel     Component Value Date/Time   CHOL 162 08/21/2018 0450   TRIG 103 08/21/2018 0450   HDL 72 08/21/2018 0450   CHOLHDL 2.3 08/21/2018 0450   VLDL 21 08/21/2018 0450   LDLCALC 69 08/21/2018 0450  LDLCALC 105 (H) 12/14/2017 6962     Assessment & Plan:   1. Uncontrolled type 2 diabetes mellitus with complication Her diabetes is  complicated by CAD,  neuropathy , microalbuminuria, worsening CKD,  and patient remains at a high risk for more acute and chronic complications of diabetes which include CAD, CVA, CKD, retinopathy, and neuropathy. These are all discussed in detail with the patient.   She presents with improving glycemic profile both fasting and postprandial.  Her point-of-care A1c 6.4%.  She made dosing error on her NovoLog, which caused one of them mild postprandial hypoglycemia.   - I have re-counseled the patient on diet management and weight loss  by adopting a carbohydrate restricted / protein rich  Diet.  - she  admits there is a room for improvement in her diet and drink choices. -  Suggestion is made for her to avoid simple carbohydrates  from her diet including Cakes, Sweet Desserts / Pastries, Ice Cream, Soda (diet and regular), Sweet Tea, Candies, Chips, Cookies, Sweet Pastries,  Store Bought Juices, Alcohol in Excess of  1-2 drinks a day, Artificial Sweeteners, Coffee Creamer, and "Sugar-free" Products. This will help patient to have  stable blood glucose profile and potentially avoid unintended weight gain.   - Patient is advised to stick to a routine mealtimes to eat 3 meals  a day and avoid unnecessary snacks ( to snack only to correct hypoglycemia).  - I have approached patient with the following individualized plan to manage diabetes and patient agrees.   She will continue to require intensive treatment with basal/bolus insulin in order for her to achieve and maintain control of diabetes to target.    -She is advised to lower her Lantus to 40 units nightly, advised to continue NovoLog 10  units 3 times daily AC for premeal blood glucose readings above 90 mg per DL.   -She is advised to avoid correction due to the fact that it causes confusion to her.    She is advised to continue monitoring blood glucose 4 times a day-before meals and at bedtime .  Her insurance did not provide coverage for the CGM device.  -Patient is encouraged to call clinic for blood glucose levels less than 70 or above 300 mg /dl.   - Patient specific target  for A1c; LDL, HDL, Triglycerides, and  Waist Circumference were discussed in detail.  2) BP/HTN: Her blood pressure is controlled to target.  She has adequate medications, advised to be consistent and continue her current medications including hydralazine 100 mg 3 times a day, torsemide as needed, amlodipine 10 mg p.o. daily, carvedilol 25 mg p.o. twice daily, Imdur 60 mg p.o. daily.   3) Lipids/HPL: Her recent lipid panel showed improved LDL at 105 improving from 127.  She is benefiting from statin.  She is advised to continue pravastatin 40 mg p.o. nightly.    Side effects and precautions discussed with her.   4)  Weight/Diet: Her BMI is 35, currently losing weight.  She will benefit from continued modest weight loss.  CDE consult in progress, exercise, and carbohydrates information provided.  5) hypothyroidism: -Her previsit thyroid function tests are consistent with appropriate  replacement.  She is advised to continue levothyroxine 137 mcg p.o. daily before breakfast.    - We discussed about the correct intake of her thyroid hormone, on empty stomach at fasting, with water, separated by at least 30 minutes from breakfast and other medications,  and separated by more than 4  hours from calcium, iron, multivitamins, acid reflux medications (PPIs). -Patient is made aware of the fact that thyroid hormone replacement is needed for life, dose to be adjusted by periodic monitoring of thyroid function tests.    6) Chronic Care/Health Maintenance:  -Patient is on ACEI/ARB and Statin medications and encouraged to continue to follow up with Ophthalmology, Podiatrist at least yearly or according to recommendations, and advised to  stay away from smoking. I have recommended yearly flu vaccine and pneumonia vaccination at least every 5 years; moderate intensity exercise for up to 150 minutes weekly; and  sleep for at least 7 hours a day.  I advised patient to maintain close follow up with her PCP for primary care needs.  - Time spent on this patient care encounter:  35 min, of which > 50% was spent in  counseling and the rest reviewing her blood glucose logs , discussing her hypoglycemia and hyperglycemia episodes, reviewing her current and  previous labs / studies  ( including abstraction from other facilities) and medications  doses and developing a  long term treatment plan and documenting her care.   Please refer to Patient Instructions for Blood Glucose Monitoring and Insulin/Medications Dosing Guide"  in media tab for additional information. Please  also refer to " Patient Self Inventory" in the Media  tab for reviewed elements of pertinent patient history.  Holly Baldwin participated in the discussions, expressed understanding, and voiced agreement with the above plans.  All questions were answered to her satisfaction. she is encouraged to contact clinic should she have any  questions or concerns prior to her return visit.     Follow up plan: Return in about 4 months (around 12/09/2019) for Bring Meter and Logs- A1c in Office.  Glade Lloyd, MD Phone: [phone]  Fax: [phone]  This note was partially dictated with voice recognition software. Similar sounding words can be transcribed inadequately or may not  be corrected upon review.  08/09/2019, 1:04 PM

## 2019-08-15 ENCOUNTER — Other Ambulatory Visit: Payer: Self-pay | Admitting: "Endocrinology

## 2019-08-15 MED ORDER — INSULIN ASPART 100 UNIT/ML FLEXPEN: 10.0000 [IU] | PEN_INJECTOR | Freq: Three times a day (TID) | SUBCUTANEOUS | 0 refills | Status: DC

## 2019-08-15 NOTE — Telephone Encounter (Signed)
Patient is requesting a refill on the following medication .  insulin aspart (NOVOLOG) 100 UNIT/ML [place], [place] Phone:  [phone]  Fax:  [phone]

## 2019-08-15 NOTE — Telephone Encounter (Signed)
Rx refill sent.

## 2019-08-17 DIAGNOSIS — E1122 Type 2 diabetes mellitus with diabetic chronic kidney disease: Secondary | ICD-10-CM | POA: Diagnosis not present

## 2019-08-17 DIAGNOSIS — E870 Hyperosmolality and hypernatremia: Secondary | ICD-10-CM | POA: Diagnosis not present

## 2019-08-17 DIAGNOSIS — E1129 Type 2 diabetes mellitus with other diabetic kidney complication: Secondary | ICD-10-CM | POA: Diagnosis not present

## 2019-08-17 DIAGNOSIS — N189 Chronic kidney disease, unspecified: Secondary | ICD-10-CM | POA: Diagnosis not present

## 2019-08-17 DIAGNOSIS — D631 Anemia in chronic kidney disease: Secondary | ICD-10-CM | POA: Diagnosis not present

## 2019-08-17 DIAGNOSIS — I5032 Chronic diastolic (congestive) heart failure: Secondary | ICD-10-CM | POA: Diagnosis not present

## 2019-08-17 DIAGNOSIS — E211 Secondary hyperparathyroidism, not elsewhere classified: Secondary | ICD-10-CM | POA: Diagnosis not present

## 2019-08-17 DIAGNOSIS — I129 Hypertensive chronic kidney disease with stage 1 through stage 4 chronic kidney disease, or unspecified chronic kidney disease: Secondary | ICD-10-CM | POA: Diagnosis not present

## 2019-08-17 DIAGNOSIS — R809 Proteinuria, unspecified: Secondary | ICD-10-CM | POA: Diagnosis not present

## 2019-08-18 ENCOUNTER — Other Ambulatory Visit: Payer: Self-pay | Admitting: "Endocrinology

## 2019-08-18 DIAGNOSIS — R809 Proteinuria, unspecified: Secondary | ICD-10-CM | POA: Diagnosis not present

## 2019-08-18 DIAGNOSIS — E870 Hyperosmolality and hypernatremia: Secondary | ICD-10-CM | POA: Diagnosis not present

## 2019-08-18 DIAGNOSIS — E1122 Type 2 diabetes mellitus with diabetic chronic kidney disease: Secondary | ICD-10-CM | POA: Diagnosis not present

## 2019-08-18 DIAGNOSIS — I129 Hypertensive chronic kidney disease with stage 1 through stage 4 chronic kidney disease, or unspecified chronic kidney disease: Secondary | ICD-10-CM | POA: Diagnosis not present

## 2019-08-18 DIAGNOSIS — I5032 Chronic diastolic (congestive) heart failure: Secondary | ICD-10-CM | POA: Diagnosis not present

## 2019-08-18 DIAGNOSIS — N189 Chronic kidney disease, unspecified: Secondary | ICD-10-CM | POA: Diagnosis not present

## 2019-08-18 DIAGNOSIS — D631 Anemia in chronic kidney disease: Secondary | ICD-10-CM | POA: Diagnosis not present

## 2019-08-18 DIAGNOSIS — E211 Secondary hyperparathyroidism, not elsewhere classified: Secondary | ICD-10-CM | POA: Diagnosis not present

## 2019-08-18 DIAGNOSIS — E1129 Type 2 diabetes mellitus with other diabetic kidney complication: Secondary | ICD-10-CM | POA: Diagnosis not present

## 2019-08-22 ENCOUNTER — Telehealth: Payer: Self-pay | Admitting: "Endocrinology

## 2019-08-22 MED ORDER — ATORVASTATIN CALCIUM 40 MG PO TABS: 40.0000 mg | ORAL_TABLET | Freq: Every day | ORAL | 0 refills | Status: DC

## 2019-08-22 NOTE — Telephone Encounter (Signed)
Sent rx for atorvastatin 40mg  daily to [facility].

## 2019-08-22 NOTE — Telephone Encounter (Signed)
It was one of her meds at least until she saw [place] last time. Atorva 40 mg qhs is appropriate.

## 2019-08-22 NOTE — Telephone Encounter (Signed)
[employer] left a VM that they sent a request for Atorvastatin on 6/14. I do not see this on the pt's med list. Please advise

## 2019-08-31 DIAGNOSIS — I130 Hypertensive heart and chronic kidney disease with heart failure and stage 1 through stage 4 chronic kidney disease, or unspecified chronic kidney disease: Secondary | ICD-10-CM | POA: Diagnosis not present

## 2019-08-31 DIAGNOSIS — I5032 Chronic diastolic (congestive) heart failure: Secondary | ICD-10-CM | POA: Diagnosis not present

## 2019-08-31 DIAGNOSIS — E1122 Type 2 diabetes mellitus with diabetic chronic kidney disease: Secondary | ICD-10-CM | POA: Diagnosis not present

## 2019-08-31 DIAGNOSIS — N184 Chronic kidney disease, stage 4 (severe): Secondary | ICD-10-CM | POA: Diagnosis not present

## 2019-09-07 DIAGNOSIS — E113313 Type 2 diabetes mellitus with moderate nonproliferative diabetic retinopathy with macular edema, bilateral: Secondary | ICD-10-CM | POA: Diagnosis not present

## 2019-09-22 DIAGNOSIS — D631 Anemia in chronic kidney disease: Secondary | ICD-10-CM | POA: Diagnosis not present

## 2019-09-22 DIAGNOSIS — E211 Secondary hyperparathyroidism, not elsewhere classified: Secondary | ICD-10-CM | POA: Diagnosis not present

## 2019-09-22 DIAGNOSIS — R809 Proteinuria, unspecified: Secondary | ICD-10-CM | POA: Diagnosis not present

## 2019-09-22 DIAGNOSIS — I129 Hypertensive chronic kidney disease with stage 1 through stage 4 chronic kidney disease, or unspecified chronic kidney disease: Secondary | ICD-10-CM | POA: Diagnosis not present

## 2019-09-22 DIAGNOSIS — E1129 Type 2 diabetes mellitus with other diabetic kidney complication: Secondary | ICD-10-CM | POA: Diagnosis not present

## 2019-09-22 DIAGNOSIS — N189 Chronic kidney disease, unspecified: Secondary | ICD-10-CM | POA: Diagnosis not present

## 2019-09-22 DIAGNOSIS — E1122 Type 2 diabetes mellitus with diabetic chronic kidney disease: Secondary | ICD-10-CM | POA: Diagnosis not present

## 2019-09-22 DIAGNOSIS — I5032 Chronic diastolic (congestive) heart failure: Secondary | ICD-10-CM | POA: Diagnosis not present

## 2019-09-30 DIAGNOSIS — I5032 Chronic diastolic (congestive) heart failure: Secondary | ICD-10-CM | POA: Diagnosis not present

## 2019-09-30 DIAGNOSIS — E1122 Type 2 diabetes mellitus with diabetic chronic kidney disease: Secondary | ICD-10-CM | POA: Diagnosis not present

## 2019-09-30 DIAGNOSIS — N184 Chronic kidney disease, stage 4 (severe): Secondary | ICD-10-CM | POA: Diagnosis not present

## 2019-09-30 DIAGNOSIS — I130 Hypertensive heart and chronic kidney disease with heart failure and stage 1 through stage 4 chronic kidney disease, or unspecified chronic kidney disease: Secondary | ICD-10-CM | POA: Diagnosis not present

## 2019-10-05 DIAGNOSIS — N189 Chronic kidney disease, unspecified: Secondary | ICD-10-CM | POA: Diagnosis not present

## 2019-10-05 DIAGNOSIS — D631 Anemia in chronic kidney disease: Secondary | ICD-10-CM | POA: Diagnosis not present

## 2019-10-05 DIAGNOSIS — I129 Hypertensive chronic kidney disease with stage 1 through stage 4 chronic kidney disease, or unspecified chronic kidney disease: Secondary | ICD-10-CM | POA: Diagnosis not present

## 2019-10-05 DIAGNOSIS — E1122 Type 2 diabetes mellitus with diabetic chronic kidney disease: Secondary | ICD-10-CM | POA: Diagnosis not present

## 2019-10-05 DIAGNOSIS — E1129 Type 2 diabetes mellitus with other diabetic kidney complication: Secondary | ICD-10-CM | POA: Diagnosis not present

## 2019-10-05 DIAGNOSIS — R001 Bradycardia, unspecified: Secondary | ICD-10-CM | POA: Diagnosis not present

## 2019-10-05 DIAGNOSIS — E211 Secondary hyperparathyroidism, not elsewhere classified: Secondary | ICD-10-CM | POA: Diagnosis not present

## 2019-10-05 DIAGNOSIS — I5032 Chronic diastolic (congestive) heart failure: Secondary | ICD-10-CM | POA: Diagnosis not present

## 2019-10-05 DIAGNOSIS — R809 Proteinuria, unspecified: Secondary | ICD-10-CM | POA: Diagnosis not present

## 2019-10-21 ENCOUNTER — Other Ambulatory Visit: Payer: Self-pay | Admitting: "Endocrinology

## 2019-10-24 DIAGNOSIS — N189 Chronic kidney disease, unspecified: Secondary | ICD-10-CM | POA: Diagnosis not present

## 2019-10-24 DIAGNOSIS — I5032 Chronic diastolic (congestive) heart failure: Secondary | ICD-10-CM | POA: Diagnosis not present

## 2019-10-24 DIAGNOSIS — E211 Secondary hyperparathyroidism, not elsewhere classified: Secondary | ICD-10-CM | POA: Diagnosis not present

## 2019-10-24 DIAGNOSIS — R809 Proteinuria, unspecified: Secondary | ICD-10-CM | POA: Diagnosis not present

## 2019-10-24 DIAGNOSIS — E1129 Type 2 diabetes mellitus with other diabetic kidney complication: Secondary | ICD-10-CM | POA: Diagnosis not present

## 2019-10-24 DIAGNOSIS — D631 Anemia in chronic kidney disease: Secondary | ICD-10-CM | POA: Diagnosis not present

## 2019-10-24 DIAGNOSIS — I129 Hypertensive chronic kidney disease with stage 1 through stage 4 chronic kidney disease, or unspecified chronic kidney disease: Secondary | ICD-10-CM | POA: Diagnosis not present

## 2019-10-24 DIAGNOSIS — E1122 Type 2 diabetes mellitus with diabetic chronic kidney disease: Secondary | ICD-10-CM | POA: Diagnosis not present

## 2019-10-26 DIAGNOSIS — R001 Bradycardia, unspecified: Secondary | ICD-10-CM | POA: Diagnosis not present

## 2019-10-26 DIAGNOSIS — D631 Anemia in chronic kidney disease: Secondary | ICD-10-CM | POA: Diagnosis not present

## 2019-10-26 DIAGNOSIS — N189 Chronic kidney disease, unspecified: Secondary | ICD-10-CM | POA: Diagnosis not present

## 2019-10-26 DIAGNOSIS — I129 Hypertensive chronic kidney disease with stage 1 through stage 4 chronic kidney disease, or unspecified chronic kidney disease: Secondary | ICD-10-CM | POA: Diagnosis not present

## 2019-10-26 DIAGNOSIS — E211 Secondary hyperparathyroidism, not elsewhere classified: Secondary | ICD-10-CM | POA: Diagnosis not present

## 2019-10-26 DIAGNOSIS — I5032 Chronic diastolic (congestive) heart failure: Secondary | ICD-10-CM | POA: Diagnosis not present

## 2019-10-26 DIAGNOSIS — R809 Proteinuria, unspecified: Secondary | ICD-10-CM | POA: Diagnosis not present

## 2019-10-26 DIAGNOSIS — K5909 Other constipation: Secondary | ICD-10-CM | POA: Diagnosis not present

## 2019-10-26 DIAGNOSIS — E1122 Type 2 diabetes mellitus with diabetic chronic kidney disease: Secondary | ICD-10-CM | POA: Diagnosis not present

## 2019-10-31 ENCOUNTER — Ambulatory Visit (HOSPITAL_COMMUNITY): Payer: Medicare HMO | Attending: Family Medicine

## 2019-11-01 DIAGNOSIS — I130 Hypertensive heart and chronic kidney disease with heart failure and stage 1 through stage 4 chronic kidney disease, or unspecified chronic kidney disease: Secondary | ICD-10-CM | POA: Diagnosis not present

## 2019-11-01 DIAGNOSIS — I5032 Chronic diastolic (congestive) heart failure: Secondary | ICD-10-CM | POA: Diagnosis not present

## 2019-11-01 DIAGNOSIS — E1122 Type 2 diabetes mellitus with diabetic chronic kidney disease: Secondary | ICD-10-CM | POA: Diagnosis not present

## 2019-11-01 DIAGNOSIS — N184 Chronic kidney disease, stage 4 (severe): Secondary | ICD-10-CM | POA: Diagnosis not present

## 2019-11-02 ENCOUNTER — Encounter (HOSPITAL_COMMUNITY): Payer: Medicare HMO

## 2019-11-02 ENCOUNTER — Other Ambulatory Visit: Payer: Self-pay

## 2019-11-09 ENCOUNTER — Telehealth: Payer: Self-pay | Admitting: "Endocrinology

## 2019-11-09 MED ORDER — ACCU-CHEK AVIVA PLUS VI STRP: ORAL_STRIP | 2 refills | Status: DC

## 2019-11-09 NOTE — Telephone Encounter (Signed)
Pt requesting a call back, she states [employer] called her and states that in order for her to get her test strips we have to call them.

## 2019-11-09 NOTE — Telephone Encounter (Signed)
Sent a Rx refill for pt's test strips to [facility].

## 2019-11-16 DIAGNOSIS — I129 Hypertensive chronic kidney disease with stage 1 through stage 4 chronic kidney disease, or unspecified chronic kidney disease: Secondary | ICD-10-CM | POA: Diagnosis not present

## 2019-11-16 DIAGNOSIS — E1122 Type 2 diabetes mellitus with diabetic chronic kidney disease: Secondary | ICD-10-CM | POA: Diagnosis not present

## 2019-11-16 DIAGNOSIS — E1129 Type 2 diabetes mellitus with other diabetic kidney complication: Secondary | ICD-10-CM | POA: Diagnosis not present

## 2019-11-16 DIAGNOSIS — I5032 Chronic diastolic (congestive) heart failure: Secondary | ICD-10-CM | POA: Diagnosis not present

## 2019-11-16 DIAGNOSIS — R001 Bradycardia, unspecified: Secondary | ICD-10-CM | POA: Diagnosis not present

## 2019-11-16 DIAGNOSIS — E211 Secondary hyperparathyroidism, not elsewhere classified: Secondary | ICD-10-CM | POA: Diagnosis not present

## 2019-11-16 DIAGNOSIS — N189 Chronic kidney disease, unspecified: Secondary | ICD-10-CM | POA: Diagnosis not present

## 2019-11-16 DIAGNOSIS — K5909 Other constipation: Secondary | ICD-10-CM | POA: Diagnosis not present

## 2019-11-16 DIAGNOSIS — R809 Proteinuria, unspecified: Secondary | ICD-10-CM | POA: Diagnosis not present

## 2019-11-25 DIAGNOSIS — Z6834 Body mass index (BMI) 34.0-34.9, adult: Secondary | ICD-10-CM | POA: Diagnosis not present

## 2019-11-25 DIAGNOSIS — Z23 Encounter for immunization: Secondary | ICD-10-CM | POA: Diagnosis not present

## 2019-11-25 DIAGNOSIS — H6092 Unspecified otitis externa, left ear: Secondary | ICD-10-CM | POA: Diagnosis not present

## 2019-11-25 DIAGNOSIS — I10 Essential (primary) hypertension: Secondary | ICD-10-CM | POA: Diagnosis not present

## 2019-11-25 DIAGNOSIS — E6609 Other obesity due to excess calories: Secondary | ICD-10-CM | POA: Diagnosis not present

## 2019-11-30 DIAGNOSIS — I5032 Chronic diastolic (congestive) heart failure: Secondary | ICD-10-CM | POA: Diagnosis not present

## 2019-11-30 DIAGNOSIS — D631 Anemia in chronic kidney disease: Secondary | ICD-10-CM | POA: Diagnosis not present

## 2019-11-30 DIAGNOSIS — N189 Chronic kidney disease, unspecified: Secondary | ICD-10-CM | POA: Diagnosis not present

## 2019-11-30 DIAGNOSIS — R809 Proteinuria, unspecified: Secondary | ICD-10-CM | POA: Diagnosis not present

## 2019-11-30 DIAGNOSIS — R9431 Abnormal electrocardiogram [ECG] [EKG]: Secondary | ICD-10-CM | POA: Diagnosis not present

## 2019-11-30 DIAGNOSIS — E1129 Type 2 diabetes mellitus with other diabetic kidney complication: Secondary | ICD-10-CM | POA: Diagnosis not present

## 2019-11-30 DIAGNOSIS — E1122 Type 2 diabetes mellitus with diabetic chronic kidney disease: Secondary | ICD-10-CM | POA: Diagnosis not present

## 2019-11-30 DIAGNOSIS — I129 Hypertensive chronic kidney disease with stage 1 through stage 4 chronic kidney disease, or unspecified chronic kidney disease: Secondary | ICD-10-CM | POA: Diagnosis not present

## 2019-12-12 ENCOUNTER — Other Ambulatory Visit: Payer: Self-pay

## 2019-12-12 ENCOUNTER — Encounter: Payer: Self-pay | Admitting: Nurse Practitioner

## 2019-12-12 ENCOUNTER — Ambulatory Visit (INDEPENDENT_AMBULATORY_CARE_PROVIDER_SITE_OTHER): Payer: Medicare HMO | Admitting: Nurse Practitioner

## 2019-12-12 VITALS — BP 147/67 | HR 50 | Ht 64.0 in | Wt 209.6 lb

## 2019-12-12 DIAGNOSIS — E039 Hypothyroidism, unspecified: Secondary | ICD-10-CM

## 2019-12-12 DIAGNOSIS — I10 Essential (primary) hypertension: Secondary | ICD-10-CM | POA: Diagnosis not present

## 2019-12-12 DIAGNOSIS — Z794 Long term (current) use of insulin: Secondary | ICD-10-CM

## 2019-12-12 DIAGNOSIS — E1165 Type 2 diabetes mellitus with hyperglycemia: Secondary | ICD-10-CM | POA: Diagnosis not present

## 2019-12-12 DIAGNOSIS — E119 Type 2 diabetes mellitus without complications: Secondary | ICD-10-CM | POA: Diagnosis not present

## 2019-12-12 DIAGNOSIS — E782 Mixed hyperlipidemia: Secondary | ICD-10-CM | POA: Diagnosis not present

## 2019-12-12 DIAGNOSIS — E118 Type 2 diabetes mellitus with unspecified complications: Secondary | ICD-10-CM | POA: Diagnosis not present

## 2019-12-12 DIAGNOSIS — IMO0002 Reserved for concepts with insufficient information to code with codable children: Secondary | ICD-10-CM

## 2019-12-12 LAB — POCT GLYCOSYLATED HEMOGLOBIN (HGB A1C): Hemoglobin A1C: 6.4 % — AB (ref 4.0–5.6)

## 2019-12-12 NOTE — Progress Notes (Signed)
12/12/2019      Endocrinology follow-up note   Subjective:    Patient ID: Holly Baldwin, female    DOB: [birth year],    Past Medical History:  Diagnosis Date  . Anxiety   . Chronic abdominal pain   . Diabetes mellitus   . GERD (gastroesophageal reflux disease)   . Hiatal hernia   . Hypercholesteremia   . Hypertension    Past Surgical History:  Procedure Laterality Date  . ABDOMINAL HYSTERECTOMY    . COLONOSCOPY N/A 06/23/2012   Procedure: COLONOSCOPY;  Surgeon: Rogene Houston, MD;  Location: [facility];  Service: Endoscopy;  Laterality: N/A;  100-moved to 1200 Ann to notify pt  . ESOPHAGOGASTRODUODENOSCOPY N/A 05/06/2012   Procedure: ESOPHAGOGASTRODUODENOSCOPY (EGD);  Surgeon: Rogene Houston, MD;  Location: [facility];  Service: Endoscopy;  Laterality: N/A;  . MASS EXCISION Right 05/26/2012   Procedure: EXCISION NEOPLASM RIGHT THIGH;  Surgeon: Jamesetta So, MD;  Location: [facility];  Service: General;  Laterality: Right;   Social History   Socioeconomic History  . Marital status: Married    Spouse name: Not on file  . Number of children: Not on file  . Years of education: Not on file  . Highest education level: Not on file  Occupational History  . Not on file  Tobacco Use  . Smoking status: Never Smoker  . Smokeless tobacco: Never Used  Vaping Use  . Vaping Use: Never used  Substance and Sexual Activity  . Alcohol use: No  . Drug use: No  . Sexual activity: Not on file  Other Topics Concern  . Not on file  Social History Narrative  . Not on file   Social Determinants of Health   Financial Resource Strain:   . Difficulty of Paying Living Expenses: Not on file  Food Insecurity:   . Worried About Charity fundraiser in the Last Year: Not on file  . Ran Out of Food in the Last Year: Not on file  Transportation Needs:   . Lack of Transportation (Medical): Not on file  . Lack of Transportation (Non-Medical): Not on file  Physical Activity:   . Days of  Exercise per Week: Not on file  . Minutes of Exercise per Session: Not on file  Stress:   . Feeling of Stress : Not on file  Social Connections:   . Frequency of Communication with Friends and Family: Not on file  . Frequency of Social Gatherings with Friends and Family: Not on file  . Attends Religious Services: Not on file  . Active Member of Clubs or Organizations: Not on file  . Attends Archivist Meetings: Not on file  . Marital Status: Not on file   Outpatient Encounter Medications as of 12/12/2019  Medication Sig  . albuterol (PROVENTIL HFA;VENTOLIN HFA) 108 (90 Base) MCG/ACT inhaler Inhale into the lungs every 6 (six) hours as needed for wheezing or shortness of breath.  Marland Kitchen amLODipine (NORVASC) 10 MG tablet Take 1 tablet (10 mg total) by mouth daily.  Marland Kitchen aspirin EC 81 MG tablet Take 81 mg by mouth at bedtime.  Marland Kitchen atorvastatin (LIPITOR) 40 MG tablet Take 1 tablet (40 mg total) by mouth daily.  . budesonide-formoterol (SYMBICORT) 160-4.5 MCG/ACT inhaler Inhale 2 puffs into the lungs 2 (two) times daily.  . capsaicin (ZOSTRIX) 0.025 % cream Apply topically 2 (two) times daily.  . carvedilol (COREG) 25 MG tablet Take 1 tablet (25 mg total) by mouth  2 (two) times daily with a meal.  . Cholecalciferol (VITAMIN D3) 125 MCG (5000 UT) CAPS TAKE 1 CAPSULE EVERY DAY  . cycloSPORINE (RESTASIS) 0.05 % ophthalmic emulsion Place 1 drop into both eyes daily.  Marland Kitchen EPINEPHrine (EPI-PEN) 0.3 mg/0.3 mL DEVI Inject 0.3 mg into the muscle once.  Marland Kitchen FLUoxetine (PROZAC) 20 MG capsule Take 20 mg by mouth every morning.   . furosemide (LASIX) 40 MG tablet Take 1 tablet (40 mg total) by mouth daily.  Marland Kitchen gabapentin (NEURONTIN) 100 MG capsule TAKE 1 CAPSULE TWICE DAILY  . glucose blood (ACCU-CHEK AVIVA PLUS) test strip TEST BLOOD GLUCOSE FOUR TIMES DAILY  . hydrALAZINE (APRESOLINE) 100 MG tablet Take 1 tablet (100 mg total) by mouth 3 (three) times daily.  . insulin aspart (NOVOLOG) 100 UNIT/ML FlexPen  Inject 10 Units into the skin 3 (three) times daily with meals.  . insulin glargine (LANTUS SOLOSTAR) 100 UNIT/ML Solostar Pen Inject 40 Units into the skin at bedtime.  . isosorbide mononitrate (IMDUR) 60 MG 24 hr tablet Take 1 tablet (60 mg total) by mouth daily.  Marland Kitchen levothyroxine (SYNTHROID) 137 MCG tablet TAKE 1 TABLET (137 MCG TOTAL) BY MOUTH DAILY BEFORE BREAKFAST.  Marland Kitchen simvastatin (ZOCOR) 40 MG tablet TAKE 1 TABLET EVERY DAY  . topiramate (TOPAMAX) 100 MG tablet Take 1 tablet by mouth 2 (two) times daily.  . ziprasidone (GEODON) 80 MG capsule Take 80 mg by mouth at bedtime.    . Continuous Blood Gluc Sensor (FREESTYLE LIBRE 14 DAY SENSOR) MISC Inject 1 each into the skin every 14 (fourteen) days. Use as directed. (Patient not taking: Reported on 12/12/2019)  . Continuous Blood Gluc Sensor (FREESTYLE LIBRE 14 DAY SENSOR) MISC Inject 1 each into the skin every 14 (fourteen) days. Use as directed. (Patient not taking: Reported on 12/12/2019)   No facility-administered encounter medications on file as of 12/12/2019.   ALLERGIES: Allergies  Allergen Reactions  . Bee Venom Anaphylaxis  . Ace Inhibitors   . Penicillins Hives    .Has patient had a PCN reaction causing immediate rash, facial/tongue/throat swelling, SOB or lightheadedness with hypotension: Yes Has patient had a PCN reaction causing severe rash involving mucus membranes or skin necrosis: No Has patient had a PCN reaction that required hospitalization: Yes Has patient had a PCN reaction occurring within the last 10 years: No If all of the above answers are "NO", then may proceed with Cephalosporin use.    VACCINATION STATUS:  There is no immunization history on file for this patient.  Diabetes She presents for her follow-up diabetic visit. She has type 2 diabetes mellitus. Onset time: she was diagnosed at approximate age of 76 years. Her disease course has been improving. There are no hypoglycemic associated symptoms. Pertinent  negatives for hypoglycemia include no confusion, headaches, nervousness/anxiousness, pallor, seizures or tremors. Pertinent negatives for diabetes include no chest pain, no fatigue, no polydipsia, no polyphagia and no polyuria. There are no hypoglycemic complications. Symptoms are improving. Risk factors for coronary artery disease include diabetes mellitus, dyslipidemia, obesity, sedentary lifestyle and hypertension. Current diabetic treatment includes intensive insulin program. She is compliant with treatment most of the time. Her weight is fluctuating minimally. She is following a generally unhealthy diet. When asked about meal planning, she reported none. She has not had a previous visit with a dietitian. She never participates in exercise. Her home blood glucose trend is decreasing steadily. Her breakfast blood glucose range is generally 130-140 mg/dl. Her lunch blood glucose range is generally 130-140  mg/dl. Her dinner blood glucose range is generally 130-140 mg/dl. Her bedtime blood glucose range is generally 130-140 mg/dl. (She presents today with her meter and logs showing stable glycemic profile overall.  Her POCT A1C today is 6.4%, unchanged from last visit and an adequate goal for her.  She denies any episodes of hypoglycemia.) An ACE inhibitor/angiotensin II receptor blocker is not being taken. She does not see a podiatrist.Eye exam is current.  Thyroid Problem Presents for follow-up (She is currently on levothyroxine 137 mcg p.o. nightly.  She reports compliance with medication.) visit. Patient reports no anxiety, cold intolerance, constipation, depressed mood, diarrhea, fatigue, heat intolerance, palpitations or tremors. The symptoms have been stable. Past treatments include levothyroxine. Her past medical history is significant for diabetes and hyperlipidemia.  Hyperlipidemia This is a chronic problem. The current episode started more than 1 year ago. The problem is controlled. Recent lipid tests  were reviewed and are normal. Exacerbating diseases include chronic renal disease, diabetes and obesity. Factors aggravating her hyperlipidemia include beta blockers and fatty foods. Pertinent negatives include no chest pain, myalgias or shortness of breath. Current antihyperlipidemic treatment includes statins. The current treatment provides moderate improvement of lipids. Compliance problems include adherence to diet and adherence to exercise.  Risk factors for coronary artery disease include dyslipidemia, diabetes mellitus, hypertension, obesity, a sedentary lifestyle and post-menopausal.  Hypertension This is a chronic problem. The current episode started more than 1 year ago. The problem is unchanged. The problem is uncontrolled. Pertinent negatives include no chest pain, headaches, palpitations or shortness of breath. Agents associated with hypertension include thyroid hormones. Risk factors for coronary artery disease include dyslipidemia, diabetes mellitus, obesity, post-menopausal state and sedentary lifestyle. Past treatments include calcium channel blockers, central alpha agonists and diuretics. The current treatment provides mild improvement. Compliance problems include exercise and diet.  Identifiable causes of hypertension include chronic renal disease and a thyroid problem.      Review of systems  Constitutional: + Minimally fluctuating body weight,  current Body mass index is 35.98 kg/m. , no fatigue, no subjective hyperthermia, no subjective hypothermia Eyes: no blurry vision, no xerophthalmia ENT: no sore throat, no nodules palpated in throat, no dysphagia/odynophagia, no hoarseness Cardiovascular: no chest pain, no shortness of breath, no palpitations, no leg swelling Respiratory: no cough, no shortness of breath Gastrointestinal: no nausea/vomiting/diarrhea Musculoskeletal: no muscle/joint aches Skin: no rashes, no hyperemia Neurological: no tremors, no numbness, no tingling, no  dizziness Psychiatric: no depression, no anxiety   Objective:    BP (!) 147/67 (BP Location: Right Arm)   Pulse (!) 50   Ht 5\' 4"  (1.626 m)   Wt 209 lb 9.6 oz (95.1 kg)   BMI 35.98 kg/m   Wt Readings from Last 3 Encounters:  12/12/19 209 lb 9.6 oz (95.1 kg)  08/09/19 204 lb 9.6 oz (92.8 kg)  05/05/19 213 lb 6.4 oz (96.8 kg)    BP Readings from Last 3 Encounters:  12/12/19 (!) 147/67  08/09/19 119/66  05/05/19 (!) 145/68    Physical Exam- Limited  Constitutional:  Body mass index is 35.98 kg/m. , not in acute distress, normal state of mind Eyes:  EOMI, no exophthalmos Neck: Supple Thyroid: No gross goiter Cardiovascular: RRR, no murmers, rubs, or gallops, no edema Respiratory: Adequate breathing efforts, no crackles, rales, rhonchi, or wheezing Musculoskeletal: no gross deformities, strength intact in all four extremities, no gross restriction of joint movements Skin:  no rashes, no hyperemia Neurological: no tremor with outstretched hands  Foot exam:   No rashes, ulcers, cuts, calluses, onychodystrophy.   Good pulses bilat.  Good sensation to 10 g monofilament bilat.   POCT ABI Results 12/12/19   Right ABI:  1.15      Left ABI:  1.21  Right leg systolic / diastolic: 161/09 mmHg Left leg systolic / diastolic: 604/54 mmHg  Arm systolic / diastolic: 098/11 mmHG  Detailed report will be scanned into patient chart.  Results for orders placed or performed in visit on 12/12/19  HgB A1c  Result Value Ref Range   Hemoglobin A1C 6.4 (A) 4.0 - 5.6 %   HbA1c POC (<> result, manual entry)     HbA1c, POC (prediabetic range)     HbA1c, POC (controlled diabetic range)     Diabetic Labs (most recent): Lab Results  Component Value Date   HGBA1C 6.4 (A) 12/12/2019   HGBA1C 6.4 (A) 08/09/2019   HGBA1C 7.4 (A) 05/05/2019   Lipid Panel     Component Value Date/Time   CHOL 162 08/21/2018 0450   TRIG 103 08/21/2018 0450   HDL 72 08/21/2018 0450   CHOLHDL 2.3  08/21/2018 0450   VLDL 21 08/21/2018 0450   LDLCALC 69 08/21/2018 0450   LDLCALC 105 (H) 12/14/2017 0921     Assessment & Plan:   1. Uncontrolled type 2 diabetes mellitus with complication Her diabetes is  complicated by CAD,  neuropathy , microalbuminuria, worsening CKD,  and patient remains at a high risk for more acute and chronic complications of diabetes which include CAD, CVA, CKD, retinopathy, and neuropathy. These are all discussed in detail with the patient.  She presents today with her meter and logs showing stable glycemic profile overall.  Her POCT A1C today is 6.4%, unchanged from last visit and an adequate goal for her.  She denies any episodes of hypoglycemia.  - Nutritional counseling repeated at each appointment due to patients tendency to fall back in to old habits.  - The patient admits there is a room for improvement in their diet and drink choices. -  Suggestion is made for the patient to avoid simple carbohydrates from their diet including Cakes, Sweet Desserts / Pastries, Ice Cream, Soda (diet and regular), Sweet Tea, Candies, Chips, Cookies, Sweet Pastries,  Store Bought Juices, Alcohol in Excess of  1-2 drinks a day, Artificial Sweeteners, Coffee Creamer, and "Sugar-free" Products. This will help patient to have stable blood glucose profile and potentially avoid unintended weight gain.   - I encouraged the patient to switch to  unprocessed or minimally processed complex starch and increased protein intake (animal or plant source), fruits, and vegetables.   - Patient is advised to stick to a routine mealtimes to eat 3 meals  a day and avoid unnecessary snacks ( to snack only to correct hypoglycemia).  - I have approached patient with the following individualized plan to manage diabetes and patient agrees.  She will continue to require intensive treatment with basal/bolus insulin in order for her to achieve and maintain control of diabetes to target.    -Given her  stable glycemic profile, she is advised to continue the same medication regimen.  She is advised to continue Lantus 40 units SQ daily at bedtime, Novolog 10-16 units TID with meals if glucose over 90 and she is eating.  Specific instructions on how to titrate insulin dose based on glucose readings given to patient in writing.  -she is encouraged to continue monitoring blood glucose at least 4 times daily, before  meals and at bedtime and call the clinic if she has readings less than 70 or greater than 200 for 3 tests in a row.  - Patient specific target  for A1c; LDL, HDL, Triglycerides, and  Waist Circumference were discussed in detail.  2) BP/HTN:  Her blood pressure is controlled to target.  She is advised to continue Norvasc 10 mg po daily, Coreg 25 mg po twice daily, Lasix 40 mg po daily, Hydralazine 100 mg po daily, and Isosorbide 60 mg po daily.  3) Lipids/HPL: Her most recent lipid panel on 08/21/18 shows controlled LDL at 69.  She is advised to continue Atorvastatin 40 mg po daily at bedtime.  Side effects and precautions discussed with her.  Will recheck lipid panel prior to next visit.  4)  Weight/Diet:  Her Body mass index is 35.98 kg/m. She will benefit from continued modest weight loss.  CDE consult in progress, exercise, and carbohydrates information provided.  5) Hypothyroidism: -She did not have her TFTs prior to this visit.  She is advised to continue Levothyroxine 137 mcg po daily before breakfast.  Will recheck TFTs prior to next visit and adjust dose if necessary.   - We discussed about the correct intake of her thyroid hormone, on empty stomach at fasting, with water, separated by at least 30 minutes from breakfast and other medications,  and separated by more than 4 hours from calcium, iron, multivitamins, acid reflux medications (PPIs). -Patient is made aware of the fact that thyroid hormone replacement is needed for life, dose to be adjusted by periodic monitoring of  thyroid function tests.  6) Chronic Care/Health Maintenance: -Patient is on ACEI/ARB and Statin medications and encouraged to continue to follow up with Ophthalmology, Podiatrist at least yearly or according to recommendations, and advised to  stay away from smoking. I have recommended yearly flu vaccine and pneumonia vaccination at least every 5 years; moderate intensity exercise for up to 150 minutes weekly; and  sleep for at least 7 hours a day.  I advised patient to maintain close follow up with her PCP for primary care needs.  - Time spent on this patient care encounter:  38 min, of which > 50% was spent in  counseling and the rest reviewing her blood glucose logs , discussing her hypoglycemia and hyperglycemia episodes, reviewing her current and  previous labs / studies  ( including abstraction from other facilities) and medications  doses and developing a  long term treatment plan and documenting her care.   Please refer to Patient Instructions for Blood Glucose Monitoring and Insulin/Medications Dosing Guide"  in media tab for additional information. Please  also refer to " Patient Self Inventory" in the Media  tab for reviewed elements of pertinent patient history.  Holly Baldwin participated in the discussions, expressed understanding, and voiced agreement with the above plans.  All questions were answered to her satisfaction. she is encouraged to contact clinic should she have any questions or concerns prior to her return visit.    Follow up plan: Return in about 4 months (around 04/13/2020) for Diabetes follow up with A1c in office, Thyroid follow up, Previsit labs.  Rayetta Pigg, [facility] [address] [place], [place] [address] Phone: [phone] Fax: [phone]  12/12/2019, 10:20 AM

## 2019-12-12 NOTE — Patient Instructions (Signed)

## 2019-12-15 DIAGNOSIS — H25813 Combined forms of age-related cataract, bilateral: Secondary | ICD-10-CM | POA: Diagnosis not present

## 2019-12-15 DIAGNOSIS — E113513 Type 2 diabetes mellitus with proliferative diabetic retinopathy with macular edema, bilateral: Secondary | ICD-10-CM | POA: Diagnosis not present

## 2019-12-19 ENCOUNTER — Other Ambulatory Visit: Payer: Self-pay | Admitting: "Endocrinology

## 2019-12-28 NOTE — H&P (Signed)
Surgical History & Physical  Patient Name: Holly Baldwin DOB: 07-Jun-1945  Surgery: Cataract extraction with intraocular lens implant phacoemulsification; Right Eye  Surgeon: Baruch Goldmann MD Surgery Date:  01/09/2020 Pre-Op Date:  12/15/2019  HPI: A 89 Yr. old female patient is referred by Dr Jorja Loa for cataract eval. 1. The patient complains of difficulty when reading fine print, books, newspaper, instructions etc., which began 1 year ago. Both eyes are affected. The episode is gradual. The condition's severity increased since last visit. Symptoms occur when the patient is inside and reading. HPI  Medical History: Cataracts Acid Reflux, Anxiety, Elevated Cholesterol Diabetes Thyroid Problems  Review of Systems Negative Allergic/Immunologic Negative Cardiovascular Negative Constitutional Negative Ear, Nose, Mouth & Throat Negative Endocrine Negative Eyes Negative Gastrointestinal Negative Genitourinary Negative Hemotologic/Lymphatic Negative Integumentary Negative Musculoskeletal Negative Neurological Negative Psychiatry Negative Respiratory  Social   Never smoked  Medication Anxiety med., Azor, Fluoxetine, Geodon, Levothyroxine Sodium, Metformin, Omeprazole, Simvastatin,   Sx/Procedures Gallbladder, Hysterectomy,   Drug Allergies  Pencilin,   History & Physical: Heent:  Cataract, Right eye NECK: supple without bruits LUNGS: lungs clear to auscultation CV: regular rate and rhythm Abdomen: soft and non-tender  Impression & Plan: Assessment: 1.  COMBINED FORMS AGE RELATED CATARACT; Both Eyes (H25.813) 2.  DM Type 2; Both Proliferative With ME (M62.8638)  Plan: 1.  Cataract accounts for the patient's decreased vision. This visual impairment is not correctable with a tolerable change in glasses or contact lenses. Cataract surgery with an implantation of a new lens should significantly improve the visual and functional status of the patient. Discussed all risks,  benefits, alternatives, and potential complications. Discussed the procedures and recovery. Patient desires to have surgery. A-scan ordered and performed today for intra-ocular lens calculations. The surgery will be performed in order to improve vision for driving, reading, and for eye examinations. Recommend phacoemulsification with intra-ocular lens. Recommend Dextenza for post-operative pain and inflammation. Right Eye worse - first. Dilates well - shugarcaine by protocol. 2.  S/P PRP OU. Possibly after focal laser OU. Very difficult to assess macula due to cataract. Will perform phaco PCIOL and reassess - may require treatment following surgery.

## 2019-12-31 ENCOUNTER — Other Ambulatory Visit: Payer: Self-pay | Admitting: "Endocrinology

## 2019-12-31 DIAGNOSIS — Z6834 Body mass index (BMI) 34.0-34.9, adult: Secondary | ICD-10-CM | POA: Diagnosis not present

## 2019-12-31 DIAGNOSIS — E6609 Other obesity due to excess calories: Secondary | ICD-10-CM | POA: Diagnosis not present

## 2019-12-31 DIAGNOSIS — I10 Essential (primary) hypertension: Secondary | ICD-10-CM | POA: Diagnosis not present

## 2020-01-02 DIAGNOSIS — H25811 Combined forms of age-related cataract, right eye: Secondary | ICD-10-CM | POA: Diagnosis not present

## 2020-01-04 NOTE — Patient Instructions (Signed)
HALLELUJAH WYSONG  01/04/2020     @PREFPERIOPPHARMACY @   Your procedure is scheduled on  01/09/2020.  Report to [facility] at  0800  A.M.  Call this number if you have problems the morning of surgery:  [phone]   Remember:  Do not eat or drink after midnight.                        Take these medicines the morning of surgery with A SIP OF WATER  Carvedilol, diltiazem, gabapentin, isosorbide, levothyroxine, procardia. Use your inhaler before you come. Take 1/2 (20 units )of your Lantus the night before your procedure. DO NOT take any medications for diabetes the morning of your procedure.    Do not wear jewelry, make-up or nail polish.  Do not wear lotions, powders, or perfumes. Please wear deodorant and brush your teeth.  Do not shave 48 hours prior to surgery.  Men may shave face and neck.  Do not bring valuables to the hospital.  [facility] is not responsible for any belongings or valuables.  Contacts, dentures or bridgework may not be worn into surgery.  Leave your suitcase in the car.  After surgery it may be brought to your room.  For patients admitted to the hospital, discharge time will be determined by your treatment team.  Patients discharged the day of surgery will not be allowed to drive home.   Name and phone number of your driver:   family Special instructions:   DO NOT smoke the morning of your procedure.  Please read over the following fact sheets that you were given. Anesthesia Post-op Instructions and Care and Recovery After Surgery      Cataract Surgery, Care After This sheet gives you information about how to care for yourself after your procedure. Your health care provider may also give you more specific instructions. If you have problems or questions, contact your health care provider. What can I expect after the procedure? After the procedure, it is common to have:  Itching.  Discomfort.  Fluid discharge.  Sensitivity to light and to  touch.  Bruising in or around the eye.  Mild blurred vision. Follow these instructions at home: Eye care   Do not touch or rub your eyes.  Protect your eyes as told by your health care provider. You may be told to wear a protective eye shield or sunglasses.  Do not put a contact lens into the affected eye or eyes until your health care provider approves.  Keep the area around your eye clean and dry: ? Avoid swimming. ? Do not allow water to hit you directly in the face while showering. ? Keep soap and shampoo out of your eyes.  Check your eye every day for signs of infection. Watch for: ? Redness, swelling, or pain. ? Fluid, blood, or pus. ? Warmth. ? A bad smell. ? Vision that is getting worse. ? Sensitivity that is getting worse. Activity  Do not drive for 24 hours if you were given a sedative during your procedure.  Avoid strenuous activities, such as playing contact sports, for as long as told by your health care provider.  Do not drive or use heavy machinery until your health care provider approves.  Do not bend or lift heavy objects. Bending increases pressure in the eye. You can walk, climb stairs, and do light household chores.  Ask your health care provider when you can return to work.  If you work in a dusty environment, you may be advised to wear protective eyewear for a period of time. General instructions  Take or apply over-the-counter and prescription medicines only as told by your health care provider. This includes eye drops.  Keep all follow-up visits as told by your health care provider. This is important. Contact a health care provider if:  You have increased bruising around your eye.  You have pain that is not helped with medicine.  You have a fever.  You have redness, swelling, or pain in your eye.  You have fluid, blood, or pus coming from your incision.  Your vision gets worse.  Your sensitivity to light gets worse. Get help right away  if:  You have sudden loss of vision.  You see flashes of light or spots (floaters).  You have severe eye pain.  You develop nausea or vomiting. Summary  After your procedure, it is common to have itching, discomfort, bruising, fluid discharge, or sensitivity to light.  Follow instructions from your health care provider about caring for your eye after the procedure.  Do not rub your eye after the procedure. You may need to wear eye protection or sunglasses. Do not wear contact lenses. Keep the area around your eye clean and dry.  Avoid activities that require a lot of effort. These include playing sports and lifting heavy objects.  Contact a health care provider if you have increased bruising, pain that does not go away, or a fever. Get help right away if you suddenly lose your vision, see flashes of light or spots, or have severe pain in the eye. This information is not intended to replace advice given to you by your health care provider. Make sure you discuss any questions you have with your health care provider. Document Revised: 12/14/2018 Document Reviewed: 08/17/2017 Elsevier Patient Education  [employer].

## 2020-01-05 ENCOUNTER — Other Ambulatory Visit: Payer: Self-pay

## 2020-01-05 ENCOUNTER — Encounter (HOSPITAL_COMMUNITY)
Admission: RE | Admit: 2020-01-05 | Discharge: 2020-01-05 | Disposition: A | Discharge status: Home / Self Care | Payer: Medicare HMO | Source: Ambulatory Visit | Attending: Ophthalmology | Admitting: Ophthalmology

## 2020-01-05 ENCOUNTER — Encounter (HOSPITAL_COMMUNITY): Payer: Self-pay

## 2020-01-05 ENCOUNTER — Other Ambulatory Visit (HOSPITAL_COMMUNITY)
Admission: RE | Admit: 2020-01-05 | Discharge: 2020-01-05 | Disposition: A | Discharge status: Home / Self Care | Payer: Medicare HMO | Source: Ambulatory Visit | Attending: Ophthalmology | Admitting: Ophthalmology

## 2020-01-05 DIAGNOSIS — Z20822 Contact with and (suspected) exposure to covid-19: Secondary | ICD-10-CM | POA: Insufficient documentation

## 2020-01-05 DIAGNOSIS — Z01812 Encounter for preprocedural laboratory examination: Secondary | ICD-10-CM | POA: Diagnosis not present

## 2020-01-05 LAB — BASIC METABOLIC PANEL
Anion gap: 9 (ref 5–15)
BUN: 44 mg/dL — ABNORMAL HIGH (ref 8–23)
CO2: 29 mmol/L (ref 22–32)
Calcium: 8.8 mg/dL — ABNORMAL LOW (ref 8.9–10.3)
Chloride: 103 mmol/L (ref 98–111)
Creatinine, Ser: 2.12 mg/dL — ABNORMAL HIGH (ref 0.44–1.00)
GFR, Estimated: 24 mL/min — ABNORMAL LOW (ref >60)
Glucose, Bld: 60 mg/dL — ABNORMAL LOW (ref 70–99)
Potassium: 3.3 mmol/L — ABNORMAL LOW (ref 3.5–5.1)
Sodium: 141 mmol/L (ref 135–145)

## 2020-01-05 LAB — SARS CORONAVIRUS 2 (TAT 6-24 HRS): SARS Coronavirus 2: NEGATIVE

## 2020-01-09 ENCOUNTER — Ambulatory Visit (HOSPITAL_COMMUNITY)
Admission: RE | Admit: 2020-01-09 | Discharge: 2020-01-09 | Disposition: A | Discharge status: Home / Self Care | Payer: Medicare HMO | Attending: Ophthalmology | Admitting: Ophthalmology

## 2020-01-09 ENCOUNTER — Other Ambulatory Visit: Payer: Self-pay

## 2020-01-09 ENCOUNTER — Encounter (HOSPITAL_COMMUNITY)
Admission: RE | Disposition: A | Discharge status: Home / Self Care | Payer: Self-pay | Source: Home / Self Care | Attending: Ophthalmology

## 2020-01-09 ENCOUNTER — Ambulatory Visit (HOSPITAL_COMMUNITY): Payer: Medicare HMO | Admitting: Anesthesiology

## 2020-01-09 ENCOUNTER — Encounter (HOSPITAL_COMMUNITY): Payer: Self-pay | Admitting: Ophthalmology

## 2020-01-09 DIAGNOSIS — H25811 Combined forms of age-related cataract, right eye: Secondary | ICD-10-CM | POA: Insufficient documentation

## 2020-01-09 DIAGNOSIS — E1136 Type 2 diabetes mellitus with diabetic cataract: Secondary | ICD-10-CM | POA: Diagnosis not present

## 2020-01-09 DIAGNOSIS — E1122 Type 2 diabetes mellitus with diabetic chronic kidney disease: Secondary | ICD-10-CM | POA: Diagnosis not present

## 2020-01-09 DIAGNOSIS — Z880 Allergy status to penicillin: Secondary | ICD-10-CM | POA: Insufficient documentation

## 2020-01-09 DIAGNOSIS — I509 Heart failure, unspecified: Secondary | ICD-10-CM | POA: Diagnosis not present

## 2020-01-09 DIAGNOSIS — N189 Chronic kidney disease, unspecified: Secondary | ICD-10-CM | POA: Diagnosis not present

## 2020-01-09 DIAGNOSIS — I130 Hypertensive heart and chronic kidney disease with heart failure and stage 1 through stage 4 chronic kidney disease, or unspecified chronic kidney disease: Secondary | ICD-10-CM | POA: Diagnosis not present

## 2020-01-09 DIAGNOSIS — E113543 Type 2 diabetes mellitus with proliferative diabetic retinopathy with combined traction retinal detachment and rhegmatogenous retinal detachment, bilateral: Secondary | ICD-10-CM | POA: Diagnosis not present

## 2020-01-09 HISTORY — PX: CATARACT EXTRACTION W/PHACO: SHX586

## 2020-01-09 LAB — GLUCOSE, CAPILLARY: Glucose-Capillary: 158 mg/dL — ABNORMAL HIGH (ref 70–99)

## 2020-01-09 SURGERY — PHACOEMULSIFICATION, CATARACT, WITH IOL INSERTION
Anesthesia: Monitor Anesthesia Care | Site: Eye | Laterality: Right

## 2020-01-09 MED ORDER — PROVISC 10 MG/ML IO SOLN
INTRAOCULAR | Status: DC | PRN
  Administered 2020-01-09: 0.85 mL via INTRAOCULAR

## 2020-01-09 MED ORDER — BSS IO SOLN
INTRAOCULAR | Status: DC | PRN
  Administered 2020-01-09: 15 mL via INTRAOCULAR

## 2020-01-09 MED ORDER — TETRACAINE 0.5 % OP SOLN OPTIME - NO CHARGE
OPHTHALMIC | Status: DC | PRN
  Administered 2020-01-09: 2 [drp] via OPHTHALMIC

## 2020-01-09 MED ORDER — NEOMYCIN-POLYMYXIN-DEXAMETH 3.5-10000-0.1 OP SUSP
OPHTHALMIC | Status: DC | PRN
  Administered 2020-01-09: 1 [drp] via OPHTHALMIC

## 2020-01-09 MED ORDER — TETRACAINE HCL 0.5 % OP SOLN
1.0000 [drp] | OPHTHALMIC | Status: AC | PRN
  Administered 2020-01-09 (×3): 1 [drp] via OPHTHALMIC

## 2020-01-09 MED ORDER — EPINEPHRINE PF 1 MG/ML IJ SOLN
INTRAOCULAR | Status: DC | PRN
  Administered 2020-01-09: 500 mL

## 2020-01-09 MED ORDER — EPINEPHRINE PF 1 MG/ML IJ SOLN
INTRAMUSCULAR | Status: AC
  Filled 2020-01-09: qty 2

## 2020-01-09 MED ORDER — CYCLOPENTOLATE-PHENYLEPHRINE 0.2-1 % OP SOLN
1.0000 [drp] | OPHTHALMIC | Status: AC | PRN
  Administered 2020-01-09 (×3): 1 [drp] via OPHTHALMIC

## 2020-01-09 MED ORDER — LIDOCAINE HCL 3.5 % OP GEL
1.0000 "application " | Freq: Once | OPHTHALMIC | Status: AC
  Administered 2020-01-09: 1 via OPHTHALMIC

## 2020-01-09 MED ORDER — LIDOCAINE HCL (PF) 1 % IJ SOLN
INTRAOCULAR | Status: DC | PRN
  Administered 2020-01-09: 1 mL via OPHTHALMIC

## 2020-01-09 MED ORDER — STERILE WATER FOR IRRIGATION IR SOLN
Status: DC | PRN
  Administered 2020-01-09: 250 mL

## 2020-01-09 MED ORDER — PHENYLEPHRINE HCL 2.5 % OP SOLN
1.0000 [drp] | OPHTHALMIC | Status: AC | PRN
  Administered 2020-01-09 (×3): 1 [drp] via OPHTHALMIC

## 2020-01-09 MED ORDER — SODIUM HYALURONATE 23 MG/ML IO SOLN
INTRAOCULAR | Status: DC | PRN
  Administered 2020-01-09: 0.6 mL via INTRAOCULAR

## 2020-01-09 MED ORDER — POVIDONE-IODINE 5 % OP SOLN
OPHTHALMIC | Status: DC | PRN
  Administered 2020-01-09: 1 via OPHTHALMIC

## 2020-01-09 SURGICAL SUPPLY — 18 items
CLOTH BEACON ORANGE TIMEOUT ST (SAFETY) ×2 IMPLANT
EYE SHIELD UNIVERSAL CLEAR (GAUZE/BANDAGES/DRESSINGS) ×2 IMPLANT
GLOVE BIOGEL PI IND STRL 6.5 (GLOVE) ×1 IMPLANT
GLOVE BIOGEL PI IND STRL 7.0 (GLOVE) ×1 IMPLANT
GLOVE BIOGEL PI IND STRL 7.5 (GLOVE) ×1 IMPLANT
GLOVE BIOGEL PI INDICATOR 6.5 (GLOVE) ×1
GLOVE BIOGEL PI INDICATOR 7.0 (GLOVE) ×1
GLOVE BIOGEL PI INDICATOR 7.5 (GLOVE) ×1
GOWN STRL REUS W/ TWL XL LVL3 (GOWN DISPOSABLE) ×1 IMPLANT
GOWN STRL REUS W/TWL XL LVL3 (GOWN DISPOSABLE) ×2
NEEDLE HYPO 18GX1.5 BLUNT FILL (NEEDLE) ×2 IMPLANT
PAD ARMBOARD 7.5X6 YLW CONV (MISCELLANEOUS) ×2 IMPLANT
SYR TB 1ML LL NO SAFETY (SYRINGE) ×2 IMPLANT
TAPE SURG TRANSPORE 1 IN (GAUZE/BANDAGES/DRESSINGS) ×1 IMPLANT
TAPE SURGICAL TRANSPORE 1 IN (GAUZE/BANDAGES/DRESSINGS) ×2
VISCOELASTIC ADDITIONAL (OPHTHALMIC RELATED) IMPLANT
WATER STERILE IRR 250ML POUR (IV SOLUTION) ×2 IMPLANT
tecnis IOL (Intraocular Lens) ×2 IMPLANT

## 2020-01-09 NOTE — Discharge Instructions (Signed)
Please discharge patient when stable, will follow up today with Dr. Labrisha Wuellner at the [facility] office immediately following discharge.  Leave shield in place until visit.  All paperwork with discharge instructions will be given at the office.  [facility] Address:  [address]  [place], [place] [address]  

## 2020-01-09 NOTE — Anesthesia Postprocedure Evaluation (Signed)
Anesthesia Post Note  Patient: Holly Baldwin  Procedure(s) Performed: CATARACT EXTRACTION PHACO AND INTRAOCULAR LENS PLACEMENT (IOC) (Right Eye)  Patient location during evaluation: Phase II Anesthesia Type: MAC Level of consciousness: awake and alert Vital Signs Assessment: post-procedure vital signs reviewed and stable Respiratory status: spontaneous breathing Cardiovascular status: stable Postop Assessment: no apparent nausea or vomiting Anesthetic complications: no   No complications documented.   Last Vitals:  Vitals:   01/09/20 0932 01/09/20 1025  BP: (!) 153/68   Pulse:  62  Resp:  18  Temp:  37.1 C  SpO2:  100%    Last Pain:  Vitals:   01/09/20 1025  TempSrc: Oral  PainSc:                  Karna Dupes

## 2020-01-09 NOTE — Anesthesia Preprocedure Evaluation (Addendum)
Anesthesia Evaluation  Patient identified by MRN, date of birth, ID band Patient awake    Reviewed: Allergy & Precautions, NPO status , Patient's Chart, lab work & pertinent test results, reviewed documented beta blocker date and time   History of Anesthesia Complications Negative for: history of anesthetic complications  Airway Mallampati: III  TM Distance: >3 FB Neck ROM: Full    Dental  (+) Dental Advisory Given, Missing, Chipped   Pulmonary neg pulmonary ROS,    Pulmonary exam normal breath sounds clear to auscultation       Cardiovascular METS: 3 - Mets hypertension, Pt. on medications and Pt. on home beta blockers +CHF  Normal cardiovascular exam Rhythm:Regular Rate:Bradycardia  1. The left ventricle has normal systolic function with an ejection  fraction of 60-65%. The cavity size was normal. Left ventricular diastolic  Doppler parameters are consistent with pseudonormalization. Elevated mean  left atrial pressure.  2. The right ventricle has normal systolic function. The cavity was  normal. There is no increase in right ventricular wall thickness.  3. Left atrial size was severely dilated.  4. Right atrial size was severely dilated.  5. The pericardial effusion is circumferential.  6. Trivial pericardial effusion is present.  7. No evidence of mitral valve stenosis.  8. The aortic valve has an indeterminate number of cusps. Mild thickening  of the aortic valve. Mild calcification of the aortic valve. No stenosis  of the aortic valve. Mild aortic annular calcification noted.  9. The aortic root is normal in size and structure.  10. Pulmonary hypertension is indeterminate, inadequate TR jet.    Neuro/Psych PSYCHIATRIC DISORDERS Anxiety Depression negative neurological ROS     GI/Hepatic Neg liver ROS, hiatal hernia, GERD  Medicated and Controlled,  Endo/Other  diabetes, Well Controlled, Type 2, Insulin  Dependent, Oral Hypoglycemic AgentsHypothyroidism   Renal/GU Renal InsufficiencyRenal disease (creatinine 2.12)     Musculoskeletal negative musculoskeletal ROS (+)   Abdominal   Peds  Hematology  (+) anemia ,   Anesthesia Other Findings   Reproductive/Obstetrics negative OB ROS                            Anesthesia Physical Anesthesia Plan  ASA: III  Anesthesia Plan: MAC   Post-op Pain Management:    Induction:   PONV Risk Score and Plan:   Airway Management Planned: Nasal Cannula and Natural Airway  Additional Equipment:   Intra-op Plan:   Post-operative Plan:   Informed Consent: I have reviewed the patients History and Physical, chart, labs and discussed the procedure including the risks, benefits and alternatives for the proposed anesthesia with the patient or authorized representative who has indicated his/her understanding and acceptance.     Dental advisory given  Plan Discussed with: CRNA and Surgeon  Anesthesia Plan Comments:        Anesthesia Quick Evaluation

## 2020-01-09 NOTE — Op Note (Signed)
Date of procedure: 01/09/20  Pre-operative diagnosis:  Visually significant combined form age-related cataract, Right Eye (H25.811)  Post-operative diagnosis:  Visually significant combined form age-related cataract, Right Eye (H25.811)  Procedure: Removal of cataract via phacoemulsification and insertion of intra-ocular lens Wynetta Emery and [employer] DCB00  +20.0D into the capsular bag of the Right Eye  Attending surgeon: Gerda Diss. Yesennia Hirota, MD, MA  Anesthesia: MAC, Topical Akten  Complications: None  Estimated Blood Loss: <72m (minimal)  Specimens: None  Implants: As above  Indications:  Visually significant age-related cataract, Right Eye  Procedure:  The patient was seen and identified in the pre-operative area. The operative eye was identified and dilated.  The operative eye was marked.  Topical anesthesia was administered to the operative eye.     The patient was then to the operative suite and placed in the supine position.  A timeout was performed confirming the patient, procedure to be performed, and all other relevant information.   The patient's face was prepped and draped in the usual fashion for intra-ocular surgery.  A lid speculum was placed into the operative eye and the surgical microscope moved into place and focused.  A superotemporal paracentesis was created using a 20 gauge paracentesis blade.  Shugarcaine was injected into the anterior chamber.  Viscoelastic was injected into the anterior chamber.  A temporal clear-corneal main wound incision was created using a 2.452mmicrokeratome.  A continuous curvilinear capsulorrhexis was initiated using an irrigating cystitome and completed using capsulorrhexis forceps.  Hydrodissection and hydrodeliniation were performed.  Viscoelastic was injected into the anterior chamber.  A phacoemulsification handpiece and a chopper as a second instrument were used to remove the nucleus and epinucleus. The irrigation/aspiration handpiece was  used to remove any remaining cortical material.   The capsular bag was reinflated with viscoelastic, checked, and found to be intact.  The intraocular lens was inserted into the capsular bag.  The irrigation/aspiration handpiece was used to remove any remaining viscoelastic.  The clear corneal wound and paracentesis wounds were then hydrated and checked with Weck-Cels to be watertight.  The lid-speculum was removed.  The drape was removed.  The patient's face was cleaned with a wet and dry 4x4.   Maxitrol was instilled in the eye. A clear shield was taped over the eye. The patient was taken to the post-operative care unit in good condition, having tolerated the procedure well.  Post-Op Instructions: [facility] and will receive all other orders and instructions.

## 2020-01-09 NOTE — Interval H&P Note (Signed)
History and Physical Interval Note:  01/09/2020 9:56 AM  Holly Baldwin  has presented today for surgery, with the diagnosis of Nuclear sclerotic cataract - Right eye.  The various methods of treatment have been discussed with the patient and family. After consideration of risks, benefits and other options for treatment, the patient has consented to  Procedure(s) with comments: CATARACT EXTRACTION PHACO AND INTRAOCULAR LENS PLACEMENT (IOC) (Right) - CDE: as a surgical intervention.  The patient's history has been reviewed, patient examined, no change in status, stable for surgery.  I have reviewed the patient's chart and labs.  Questions were answered to the patient's satisfaction.     Baruch Goldmann

## 2020-01-09 NOTE — Transfer of Care (Signed)
Immediate Anesthesia Transfer of Care Note  Patient: Zsazsa B Ju  Procedure(s) Performed: CATARACT EXTRACTION PHACO AND INTRAOCULAR LENS PLACEMENT (IOC) (Right Eye)  Patient Location: PACU  Anesthesia Type:MAC  Level of Consciousness: awake, alert  and oriented  Airway & Oxygen Therapy: Patient Spontanous Breathing  Post-op Assessment: Report given to RN  Post vital signs: Reviewed  Last Vitals:  Vitals Value Taken Time  BP    Temp 37.1 C 01/09/20 1025  Pulse 62 01/09/20 1025  Resp 18 01/09/20 1025  SpO2 100 % 01/09/20 1025    Last Pain:  Vitals:   01/09/20 1025  TempSrc: Oral  PainSc:          Complications: No complications documented.

## 2020-01-10 ENCOUNTER — Encounter (HOSPITAL_COMMUNITY): Payer: Self-pay | Admitting: Ophthalmology

## 2020-01-16 DIAGNOSIS — H25812 Combined forms of age-related cataract, left eye: Secondary | ICD-10-CM | POA: Diagnosis not present

## 2020-01-17 NOTE — H&P (Signed)
Surgical History & Physical  Patient Name: Holly Baldwin DOB: August 24, 1945  Surgery: Cataract extraction with intraocular lens implant phacoemulsification; Left Eye  Surgeon: Baruch Goldmann MD Surgery Date:  01/23/2020 Pre-Op Date:  01/16/2020  HPI: A 61 Yr. old female patient 1. The patient complains of difficulty when reading fine print, books, newspaper, instructions etc., which began 1 year ago. The left eye is affected. The episode is gradual. The condition's severity increased since last visit. Symptoms occur when the patient is inside and reading. 2. The patient is returning after cataract post-op. The right eye is affected. Status post cataract post-op, which began 1 year ago: Since the last visit, the affected area is doing well. The patient's vision is improved. Patient is following medication instructions. HPI was performed by Baruch Goldmann .  Medical History: Cataracts Acid Reflux, Anxiety, Elevated Cholesterol Diabetes Thyroid Problems  Review of Systems Negative Allergic/Immunologic Negative Cardiovascular Negative Constitutional Negative Ear, Nose, Mouth & Throat Negative Endocrine Negative Eyes Negative Gastrointestinal Negative Genitourinary Negative Hemotologic/Lymphatic Negative Integumentary Negative Musculoskeletal Negative Neurological Negative Psychiatry Negative Respiratory  Social   Never smoked   Medication Prednisolone acetate 1%, Vigamox, Ilevro,  Anxiety med., Azor, Fluoxetine, Geodon, Levothyroxine Sodium, Metformin, Omeprazole, Simvastatin,   Sx/Procedures Cataract Surgery, Phaco c IOL OD,  Gallbladder, Hysterectomy,   Drug Allergies  Pencilin,   History & Physical: Heent:  Cataract, Left eye NECK: supple without bruits LUNGS: lungs clear to auscultation CV: regular rate and rhythm Abdomen: soft and non-tender  Impression & Plan: Assessment: 1.  CATARACT EXTRACTION STATUS; Right Eye (Z98.41) 2.  INTRAOCULAR LENS IOL (Z96.1) 3.   COMBINED FORMS AGE RELATED CATARACT; Left Eye (H25.812)  Plan: 1.  1 week after cataract surgery. Doing well with improved vision and normal eye pressure. Call with any problems or concerns. Stop Vigamox. Continue Ilevro 1 drop 1x/day for 3 more weeks. Continue Pred Acetate 1 drop 2x/day for 3 more weeks. 2.  Doing well since surgery Continue Post-op medications 3.  Cataract accounts for the patient's decreased vision. This visual impairment is not correctable with a tolerable change in glasses or contact lenses. Cataract surgery with an implantation of a new lens should significantly improve the visual and functional status of the patient. Discussed all risks, benefits, alternatives, and potential complications. Discussed the procedures and recovery. Patient desires to have surgery. A-scan ordered and performed today for intra-ocular lens calculations. The surgery will be performed in order to improve vision for driving, reading, and for eye examinations. Recommend phacoemulsification with intra-ocular lens. Recommend Dextenza for post-operative pain and inflammation. Left Eye. Surgery required to correct imbalance of vision. Dilates well - shugarcaine by protocol.

## 2020-01-19 ENCOUNTER — Other Ambulatory Visit: Payer: Self-pay

## 2020-01-19 ENCOUNTER — Encounter (HOSPITAL_COMMUNITY)
Admission: RE | Admit: 2020-01-19 | Discharge: 2020-01-19 | Disposition: A | Discharge status: Home / Self Care | Payer: Medicare HMO | Source: Ambulatory Visit | Attending: Ophthalmology | Admitting: Ophthalmology

## 2020-01-20 ENCOUNTER — Other Ambulatory Visit (HOSPITAL_COMMUNITY)
Admission: RE | Admit: 2020-01-20 | Discharge: 2020-01-20 | Disposition: A | Discharge status: Home / Self Care | Payer: Medicare HMO | Source: Ambulatory Visit | Attending: Ophthalmology | Admitting: Ophthalmology

## 2020-01-20 ENCOUNTER — Other Ambulatory Visit: Payer: Self-pay

## 2020-01-20 DIAGNOSIS — Z20822 Contact with and (suspected) exposure to covid-19: Secondary | ICD-10-CM | POA: Diagnosis not present

## 2020-01-20 DIAGNOSIS — Z01812 Encounter for preprocedural laboratory examination: Secondary | ICD-10-CM | POA: Diagnosis not present

## 2020-01-21 LAB — SARS CORONAVIRUS 2 (TAT 6-24 HRS): SARS Coronavirus 2: NEGATIVE

## 2020-01-23 ENCOUNTER — Ambulatory Visit (HOSPITAL_COMMUNITY): Payer: Medicare HMO | Admitting: Anesthesiology

## 2020-01-23 ENCOUNTER — Encounter (HOSPITAL_COMMUNITY): Payer: Self-pay | Admitting: Ophthalmology

## 2020-01-23 ENCOUNTER — Ambulatory Visit (HOSPITAL_COMMUNITY)
Admission: RE | Admit: 2020-01-23 | Discharge: 2020-01-23 | Disposition: A | Discharge status: Home / Self Care | Payer: Medicare HMO | Attending: Ophthalmology | Admitting: Ophthalmology

## 2020-01-23 ENCOUNTER — Encounter (HOSPITAL_COMMUNITY)
Admission: RE | Disposition: A | Discharge status: Home / Self Care | Payer: Self-pay | Source: Home / Self Care | Attending: Ophthalmology

## 2020-01-23 DIAGNOSIS — Z961 Presence of intraocular lens: Secondary | ICD-10-CM | POA: Insufficient documentation

## 2020-01-23 DIAGNOSIS — Z7952 Long term (current) use of systemic steroids: Secondary | ICD-10-CM | POA: Diagnosis not present

## 2020-01-23 DIAGNOSIS — H25812 Combined forms of age-related cataract, left eye: Secondary | ICD-10-CM | POA: Diagnosis not present

## 2020-01-23 DIAGNOSIS — Z7984 Long term (current) use of oral hypoglycemic drugs: Secondary | ICD-10-CM | POA: Diagnosis not present

## 2020-01-23 DIAGNOSIS — I110 Hypertensive heart disease with heart failure: Secondary | ICD-10-CM | POA: Diagnosis not present

## 2020-01-23 DIAGNOSIS — Z79899 Other long term (current) drug therapy: Secondary | ICD-10-CM | POA: Insufficient documentation

## 2020-01-23 DIAGNOSIS — K219 Gastro-esophageal reflux disease without esophagitis: Secondary | ICD-10-CM | POA: Diagnosis not present

## 2020-01-23 DIAGNOSIS — E1136 Type 2 diabetes mellitus with diabetic cataract: Secondary | ICD-10-CM | POA: Diagnosis not present

## 2020-01-23 DIAGNOSIS — K449 Diaphragmatic hernia without obstruction or gangrene: Secondary | ICD-10-CM | POA: Diagnosis not present

## 2020-01-23 DIAGNOSIS — I509 Heart failure, unspecified: Secondary | ICD-10-CM | POA: Diagnosis not present

## 2020-01-23 DIAGNOSIS — Z9841 Cataract extraction status, right eye: Secondary | ICD-10-CM | POA: Diagnosis not present

## 2020-01-23 HISTORY — PX: CATARACT EXTRACTION W/PHACO: SHX586

## 2020-01-23 LAB — GLUCOSE, CAPILLARY: Glucose-Capillary: 158 mg/dL — ABNORMAL HIGH (ref 70–99)

## 2020-01-23 SURGERY — PHACOEMULSIFICATION, CATARACT, WITH IOL INSERTION
Anesthesia: Monitor Anesthesia Care | Site: Eye | Laterality: Left

## 2020-01-23 MED ORDER — EPINEPHRINE PF 1 MG/ML IJ SOLN
INTRAOCULAR | Status: DC | PRN
  Administered 2020-01-23: 500 mL

## 2020-01-23 MED ORDER — EPINEPHRINE PF 1 MG/ML IJ SOLN
INTRAMUSCULAR | Status: AC
  Filled 2020-01-23: qty 2

## 2020-01-23 MED ORDER — PROVISC 10 MG/ML IO SOLN
INTRAOCULAR | Status: DC | PRN
  Administered 2020-01-23: 0.85 mL via INTRAOCULAR

## 2020-01-23 MED ORDER — CYCLOPENTOLATE-PHENYLEPHRINE 0.2-1 % OP SOLN
1.0000 [drp] | OPHTHALMIC | Status: AC | PRN
  Administered 2020-01-23 (×3): 1 [drp] via OPHTHALMIC

## 2020-01-23 MED ORDER — LIDOCAINE HCL (PF) 1 % IJ SOLN
INTRAOCULAR | Status: DC | PRN
  Administered 2020-01-23: 1 mL via OPHTHALMIC

## 2020-01-23 MED ORDER — SODIUM HYALURONATE 23 MG/ML IO SOLN
INTRAOCULAR | Status: DC | PRN
  Administered 2020-01-23 (×2): 0.6 mL via INTRAOCULAR

## 2020-01-23 MED ORDER — LIDOCAINE HCL 3.5 % OP GEL
1.0000 "application " | Freq: Once | OPHTHALMIC | Status: AC
  Administered 2020-01-23: 1 via OPHTHALMIC

## 2020-01-23 MED ORDER — STERILE WATER FOR IRRIGATION IR SOLN
Status: DC | PRN
  Administered 2020-01-23: 250 mL

## 2020-01-23 MED ORDER — TETRACAINE HCL 0.5 % OP SOLN
1.0000 [drp] | OPHTHALMIC | Status: AC | PRN
  Administered 2020-01-23 (×3): 1 [drp] via OPHTHALMIC

## 2020-01-23 MED ORDER — NEOMYCIN-POLYMYXIN-DEXAMETH 3.5-10000-0.1 OP SUSP
OPHTHALMIC | Status: DC | PRN
  Administered 2020-01-23: 1 [drp] via OPHTHALMIC

## 2020-01-23 MED ORDER — POVIDONE-IODINE 5 % OP SOLN
OPHTHALMIC | Status: DC | PRN
  Administered 2020-01-23: 1 via OPHTHALMIC

## 2020-01-23 MED ORDER — PHENYLEPHRINE HCL 2.5 % OP SOLN
1.0000 [drp] | OPHTHALMIC | Status: AC | PRN
  Administered 2020-01-23 (×3): 1 [drp] via OPHTHALMIC

## 2020-01-23 MED ORDER — BSS IO SOLN
INTRAOCULAR | Status: DC | PRN
  Administered 2020-01-23: 15 mL via INTRAOCULAR

## 2020-01-23 SURGICAL SUPPLY — 22 items
CLOTH BEACON ORANGE TIMEOUT ST (SAFETY) ×2 IMPLANT
DEVICE MILOOP (MISCELLANEOUS) IMPLANT
EYE SHIELD UNIVERSAL CLEAR (GAUZE/BANDAGES/DRESSINGS) ×2 IMPLANT
GLOVE BIOGEL PI IND STRL 6.5 (GLOVE) IMPLANT
GLOVE BIOGEL PI IND STRL 7.0 (GLOVE) ×1 IMPLANT
GLOVE BIOGEL PI IND STRL 7.5 (GLOVE) ×1 IMPLANT
GLOVE BIOGEL PI INDICATOR 6.5 (GLOVE)
GLOVE BIOGEL PI INDICATOR 7.0 (GLOVE) ×1
GLOVE BIOGEL PI INDICATOR 7.5 (GLOVE) ×1
GOWN STRL REUS W/ TWL XL LVL3 (GOWN DISPOSABLE) ×1 IMPLANT
GOWN STRL REUS W/TWL XL LVL3 (GOWN DISPOSABLE) ×2
MILOOP DEVICE (MISCELLANEOUS)
NEEDLE HYPO 18GX1.5 BLUNT FILL (NEEDLE) ×2 IMPLANT
PAD ARMBOARD 7.5X6 YLW CONV (MISCELLANEOUS) ×2 IMPLANT
RING MALYGIN (MISCELLANEOUS) IMPLANT
RING MALYGIN 7.0 (MISCELLANEOUS) IMPLANT
SYR TB 1ML LL NO SAFETY (SYRINGE) ×2 IMPLANT
TAPE SURG TRANSPORE 1 IN (GAUZE/BANDAGES/DRESSINGS) ×1 IMPLANT
TAPE SURGICAL TRANSPORE 1 IN (GAUZE/BANDAGES/DRESSINGS) ×2
TECNIS 1-PIECE IOL (Intraocular Lens) ×2 IMPLANT
VISCOELASTIC ADDITIONAL (OPHTHALMIC RELATED) IMPLANT
WATER STERILE IRR 250ML POUR (IV SOLUTION) ×2 IMPLANT

## 2020-01-23 NOTE — Discharge Instructions (Signed)
Please discharge patient when stable, will follow up today with Dr. Keyan Folson at the [facility] office immediately following discharge.  Leave shield in place until visit.  All paperwork with discharge instructions will be given at the office.  [facility] Address:  [address]  [place], [place] [address]  

## 2020-01-23 NOTE — Anesthesia Postprocedure Evaluation (Signed)
Anesthesia Post Note  Patient: Holly Baldwin  Procedure(s) Performed: CATARACT EXTRACTION PHACO AND INTRAOCULAR LENS PLACEMENT LEFT EYE (Left Eye)  Patient location during evaluation: Phase II Anesthesia Type: MAC Level of consciousness: awake and alert and oriented Pain management: pain level controlled Vital Signs Assessment: post-procedure vital signs reviewed and stable Respiratory status: spontaneous breathing, nonlabored ventilation and respiratory function stable Cardiovascular status: blood pressure returned to baseline and stable Postop Assessment: no apparent nausea or vomiting and no headache Anesthetic complications: no   No complications documented.   Last Vitals:  Vitals:   01/23/20 0957 01/23/20 1108  BP: (!) 229/66 (!) 193/104  Pulse: (!) 52 60  Resp: 17   Temp: 36.7 C 36.8 C  SpO2: 99% 100%    Last Pain:  Vitals:   01/23/20 1108  TempSrc: Oral  PainSc: 0-No pain                 Orlie Dakin

## 2020-01-23 NOTE — Anesthesia Preprocedure Evaluation (Addendum)
Anesthesia Evaluation  Patient identified by MRN, date of birth, ID band Patient awake    Reviewed: Allergy & Precautions, NPO status , Patient's Chart, lab work & pertinent test results, reviewed documented beta blocker date and time   History of Anesthesia Complications Negative for: history of anesthetic complications  Airway Mallampati: III  TM Distance: >3 FB Neck ROM: Full    Dental  (+) Dental Advisory Given, Missing, Chipped   Pulmonary neg pulmonary ROS,    Pulmonary exam normal breath sounds clear to auscultation       Cardiovascular METS: 3 - Mets hypertension, Pt. on medications and Pt. on home beta blockers +CHF  Normal cardiovascular exam Rhythm:Regular Rate:Bradycardia  1. The left ventricle has normal systolic function with an ejection  fraction of 60-65%. The cavity size was normal. Left ventricular diastolic  Doppler parameters are consistent with pseudonormalization. Elevated mean  left atrial pressure.  2. The right ventricle has normal systolic function. The cavity was  normal. There is no increase in right ventricular wall thickness.  3. Left atrial size was severely dilated.  4. Right atrial size was severely dilated.  5. The pericardial effusion is circumferential.  6. Trivial pericardial effusion is present.  7. No evidence of mitral valve stenosis.  8. The aortic valve has an indeterminate number of cusps. Mild thickening  of the aortic valve. Mild calcification of the aortic valve. No stenosis  of the aortic valve. Mild aortic annular calcification noted.  9. The aortic root is normal in size and structure.  10. Pulmonary hypertension is indeterminate, inadequate TR jet.    Neuro/Psych PSYCHIATRIC DISORDERS Anxiety Depression negative neurological ROS     GI/Hepatic Neg liver ROS, hiatal hernia, GERD  Medicated and Controlled,  Endo/Other  diabetes, Well Controlled, Type 2, Insulin  Dependent, Oral Hypoglycemic AgentsHypothyroidism   Renal/GU Renal InsufficiencyRenal disease (creatinine 2.12)     Musculoskeletal negative musculoskeletal ROS (+)   Abdominal   Peds  Hematology  (+) anemia ,   Anesthesia Other Findings   Reproductive/Obstetrics negative OB ROS                             Anesthesia Physical  Anesthesia Plan  ASA: III  Anesthesia Plan: MAC   Post-op Pain Management:    Induction:   PONV Risk Score and Plan:   Airway Management Planned: Nasal Cannula and Natural Airway  Additional Equipment:   Intra-op Plan:   Post-operative Plan:   Informed Consent: I have reviewed the patients History and Physical, chart, labs and discussed the procedure including the risks, benefits and alternatives for the proposed anesthesia with the patient or authorized representative who has indicated his/her understanding and acceptance.     Dental advisory given  Plan Discussed with: CRNA and Surgeon  Anesthesia Plan Comments:         Anesthesia Quick Evaluation

## 2020-01-23 NOTE — Interval H&P Note (Signed)
History and Physical Interval Note:  01/23/2020 10:37 AM  Lauralee B Moulin  has presented today for surgery, with the diagnosis of Nuclear sclerotic cataract - Left eye.  The various methods of treatment have been discussed with the patient and family. After consideration of risks, benefits and other options for treatment, the patient has consented to  Procedure(s) with comments: CATARACT EXTRACTION PHACO AND INTRAOCULAR LENS PLACEMENT ([place]) (Left) - left as a surgical intervention.  The patient's history has been reviewed, patient examined, no change in status, stable for surgery.  I have reviewed the patient's chart and labs.  Questions were answered to the patient's satisfaction.     Baruch Goldmann

## 2020-01-23 NOTE — Anesthesia Procedure Notes (Signed)
Procedure Name: MAC Date/Time: 01/23/2020 10:47 AM Performed by: Orlie Dakin, CRNA Pre-anesthesia Checklist: Patient identified, Emergency Drugs available, Suction available and Patient being monitored Patient Re-evaluated:Patient Re-evaluated prior to induction Oxygen Delivery Method: Nasal cannula Placement Confirmation: positive ETCO2

## 2020-01-23 NOTE — Progress Notes (Signed)
BP 200/64 after cataract surgery. Pt has returned to admission baseline.  She did not take any of her BP meds this am. Reviewed with Dr. Wyatt Haste from anesthesia. Patient was instructed to go home and take her BP meds along with her morning meds and follow up with her family doctor.

## 2020-01-23 NOTE — Op Note (Signed)
Date of procedure: 01/23/20  Pre-operative diagnosis: Visually significant age-related combined cataract, Left Eye (H25.812)  Post-operative diagnosis: Visually significant age-related combined cataract, Left Eye (H25.812)  Procedure: Removal of cataract via phacoemulsification and insertion of intra-ocular lens Johnson and Knierim  +21.0D into the capsular bag of the Left Eye  Attending surgeon: Gerda Diss. Annett Boxwell, MD, MA  Anesthesia: MAC, Topical Akten  Complications: None  Estimated Blood Loss: <28m (minimal)  Specimens: None  Implants: As above  Indications:  Visually significant age-related cataract, Left Eye  Procedure:  The patient was seen and identified in the pre-operative area. The operative eye was identified and dilated.  The operative eye was marked.  Topical anesthesia was administered to the operative eye.     The patient was then to the operative suite and placed in the supine position.  A timeout was performed confirming the patient, procedure to be performed, and all other relevant information.   The patient's face was prepped and draped in the usual fashion for intra-ocular surgery.  A lid speculum was placed into the operative eye and the surgical microscope moved into place and focused.  An inferotemporal paracentesis was created using a 20 gauge paracentesis blade.  Shugarcaine was injected into the anterior chamber.  Viscoelastic was injected into the anterior chamber.  A temporal clear-corneal main wound incision was created using a 2.465mmicrokeratome.  A continuous curvilinear capsulorrhexis was initiated using an irrigating cystitome and completed using capsulorrhexis forceps.  Hydrodissection and hydrodeliniation were performed.  Viscoelastic was injected into the anterior chamber.  A phacoemulsification handpiece and a chopper as a second instrument were used to remove the nucleus and epinucleus. The irrigation/aspiration handpiece was used to remove  any remaining cortical material.   The capsular bag was reinflated with viscoelastic, checked, and found to be intact.  The intraocular lens was inserted into the capsular bag.  The irrigation/aspiration handpiece was used to remove any remaining viscoelastic.  The clear corneal wound and paracentesis wounds were then hydrated and checked with Weck-Cels to be watertight.  The lid-speculum was removed.  The drape was removed.  The patient's face was cleaned with a wet and dry 4x4.   Maxitrol was instilled in the eye. A clear shield was taped over the eye. The patient was taken to the post-operative care unit in good condition, having tolerated the procedure well.  Post-Op Instructions: [facility] and will receive all other orders and instructions.

## 2020-01-23 NOTE — Transfer of Care (Signed)
Immediate Anesthesia Transfer of Care Note  Patient: Holly Baldwin  Procedure(s) Performed: CATARACT EXTRACTION PHACO AND INTRAOCULAR LENS PLACEMENT LEFT EYE (Left Eye)  Patient Location: Short Stay  Anesthesia Type:MAC  Level of Consciousness: awake, alert  and oriented  Airway & Oxygen Therapy: Patient Spontanous Breathing  Post-op Assessment: Report given to RN and Post -op Vital signs reviewed and stable  Post vital signs: Reviewed and stable  Last Vitals:  Vitals Value Taken Time  BP 193/104 01/23/20 1108  Temp 36.8 C 01/23/20 1108  Pulse 60 01/23/20 1108  Resp    SpO2 100 % 01/23/20 1108    Last Pain:  Vitals:   01/23/20 1108  TempSrc: Oral  PainSc: 0-No pain      Patients Stated Pain Goal: 5 (35/57/32 2025)  Complications: No complications documented.

## 2020-01-24 ENCOUNTER — Encounter (HOSPITAL_COMMUNITY): Payer: Self-pay | Admitting: Ophthalmology

## 2020-01-30 DIAGNOSIS — E1129 Type 2 diabetes mellitus with other diabetic kidney complication: Secondary | ICD-10-CM | POA: Diagnosis not present

## 2020-01-30 DIAGNOSIS — D631 Anemia in chronic kidney disease: Secondary | ICD-10-CM | POA: Diagnosis not present

## 2020-01-30 DIAGNOSIS — I129 Hypertensive chronic kidney disease with stage 1 through stage 4 chronic kidney disease, or unspecified chronic kidney disease: Secondary | ICD-10-CM | POA: Diagnosis not present

## 2020-01-30 DIAGNOSIS — R809 Proteinuria, unspecified: Secondary | ICD-10-CM | POA: Diagnosis not present

## 2020-01-30 DIAGNOSIS — E1122 Type 2 diabetes mellitus with diabetic chronic kidney disease: Secondary | ICD-10-CM | POA: Diagnosis not present

## 2020-01-30 DIAGNOSIS — I5032 Chronic diastolic (congestive) heart failure: Secondary | ICD-10-CM | POA: Diagnosis not present

## 2020-01-30 DIAGNOSIS — N189 Chronic kidney disease, unspecified: Secondary | ICD-10-CM | POA: Diagnosis not present

## 2020-01-31 DIAGNOSIS — Z6834 Body mass index (BMI) 34.0-34.9, adult: Secondary | ICD-10-CM | POA: Diagnosis not present

## 2020-01-31 DIAGNOSIS — E6609 Other obesity due to excess calories: Secondary | ICD-10-CM | POA: Diagnosis not present

## 2020-01-31 DIAGNOSIS — I10 Essential (primary) hypertension: Secondary | ICD-10-CM | POA: Diagnosis not present

## 2020-02-02 ENCOUNTER — Ambulatory Visit: Payer: Medicare HMO | Admitting: Cardiology

## 2020-02-03 DIAGNOSIS — E1122 Type 2 diabetes mellitus with diabetic chronic kidney disease: Secondary | ICD-10-CM | POA: Diagnosis not present

## 2020-02-03 DIAGNOSIS — I129 Hypertensive chronic kidney disease with stage 1 through stage 4 chronic kidney disease, or unspecified chronic kidney disease: Secondary | ICD-10-CM | POA: Diagnosis not present

## 2020-02-03 DIAGNOSIS — N189 Chronic kidney disease, unspecified: Secondary | ICD-10-CM | POA: Diagnosis not present

## 2020-02-03 DIAGNOSIS — R809 Proteinuria, unspecified: Secondary | ICD-10-CM | POA: Diagnosis not present

## 2020-02-03 DIAGNOSIS — N170 Acute kidney failure with tubular necrosis: Secondary | ICD-10-CM | POA: Diagnosis not present

## 2020-02-03 DIAGNOSIS — E1129 Type 2 diabetes mellitus with other diabetic kidney complication: Secondary | ICD-10-CM | POA: Diagnosis not present

## 2020-02-03 DIAGNOSIS — D631 Anemia in chronic kidney disease: Secondary | ICD-10-CM | POA: Diagnosis not present

## 2020-02-03 DIAGNOSIS — I5032 Chronic diastolic (congestive) heart failure: Secondary | ICD-10-CM | POA: Diagnosis not present

## 2020-02-21 ENCOUNTER — Ambulatory Visit (INDEPENDENT_AMBULATORY_CARE_PROVIDER_SITE_OTHER): Payer: Medicare HMO | Admitting: Cardiology

## 2020-02-21 ENCOUNTER — Encounter: Payer: Self-pay | Admitting: Cardiology

## 2020-02-21 VITALS — BP 134/64 | HR 58 | Ht 64.0 in | Wt 209.0 lb

## 2020-02-21 DIAGNOSIS — I5032 Chronic diastolic (congestive) heart failure: Secondary | ICD-10-CM | POA: Diagnosis not present

## 2020-02-21 DIAGNOSIS — R001 Bradycardia, unspecified: Secondary | ICD-10-CM | POA: Diagnosis not present

## 2020-02-21 NOTE — Patient Instructions (Signed)
Your physician recommends that you schedule a follow-up appointment in: PENDING WITH DR [facility] AND 2 WEEKS FOR EKG/VITALS - PLEASE BRING ALL YOUR PILL BOTTLES WITH YOU FOR YOUR NURSE VISIT   Your physician has recommended you make the following change in your medication:   STOP CARVEDILOL   STOP DILTIAZEM   Thank you for choosing [facility]!!

## 2020-02-21 NOTE — Addendum Note (Signed)
Addended by: Julian Hy T on: 02/21/2020 10:29 AM   Modules accepted: Orders

## 2020-02-21 NOTE — Progress Notes (Signed)
Clinical Summary Holly Baldwin is a 74 y.o.female seen today as a new consult, referred by Dr Theador Hawthorne for chronic diasotlic HF  1. Chronic diastolic HF - 09/3218 echo LVEF 60-65%, grade II DDx - diuretics managed by neprhology, denies any recent symptoms.    2. CKD - followed by nephrology   3. Bradycardia - noted by nephrologist originally, had been on coreg and dilt which were stopped - no lighthheadedness or dizziness, no syncope. -unsure if still taking dilt and coreg. I spoke to husband who usually manages her meds, he states she is off dilt but still taking coreg   Past Medical History:  Diagnosis Date  . Anxiety   . Chronic abdominal pain   . Diabetes mellitus   . GERD (gastroesophageal reflux disease)   . Hiatal hernia   . Hypercholesteremia   . Hypertension      Allergies  Allergen Reactions  . Bee Venom Anaphylaxis  . Ace Inhibitors   . Penicillins Hives    .Has patient had a PCN reaction causing immediate rash, facial/tongue/throat swelling, SOB or lightheadedness with hypotension: Yes Has patient had a PCN reaction causing severe rash involving mucus membranes or skin necrosis: No Has patient had a PCN reaction that required hospitalization: Yes Has patient had a PCN reaction occurring within the last 10 years: No If all of the above answers are "NO", then may proceed with Cephalosporin use.      Current Outpatient Medications  Medication Sig Dispense Refill  . albuterol (PROVENTIL HFA;VENTOLIN HFA) 108 (90 Base) MCG/ACT inhaler Inhale 1-2 puffs into the lungs every 6 (six) hours as needed for wheezing or shortness of breath.     Marland Kitchen aspirin EC 81 MG tablet Take 81 mg by mouth at bedtime.    . carvedilol (COREG) 25 MG tablet Take 1 tablet (25 mg total) by mouth 2 (two) times daily with a meal. 60 tablet 3  . diltiazem (TIAZAC) 360 MG 24 hr capsule Take 360 mg by mouth daily.    Marland Kitchen EPINEPHrine (EPI-PEN) 0.3 mg/0.3 mL DEVI Inject 0.3 mg into the muscle once.     . furosemide (LASIX) 40 MG tablet Take 1 tablet (40 mg total) by mouth daily. 30 tablet 3  . gabapentin (NEURONTIN) 100 MG capsule TAKE 1 CAPSULE TWICE DAILY 180 capsule 0  . glipiZIDE (GLUCOTROL) 5 MG tablet Take 5 mg by mouth daily.    Marland Kitchen glucose blood (ACCU-CHEK AVIVA PLUS) test strip TEST BLOOD GLUCOSE FOUR TIMES DAILY 400 strip 2  . hydrALAZINE (APRESOLINE) 100 MG tablet Take 1 tablet (100 mg total) by mouth 3 (three) times daily. 90 tablet 3  . ILEVRO 0.3 % ophthalmic suspension     . insulin aspart (NOVOLOG) 100 UNIT/ML FlexPen Inject 10 Units into the skin 3 (three) times daily with meals. 15 mL 0  . insulin glargine (LANTUS SOLOSTAR) 100 UNIT/ML Solostar Pen Inject 40 Units into the skin at bedtime. 45 mL 1  . isosorbide mononitrate (IMDUR) 60 MG 24 hr tablet Take 1 tablet (60 mg total) by mouth daily. 30 tablet 3  . levothyroxine (SYNTHROID) 137 MCG tablet TAKE 1 TABLET (137 MCG TOTAL) BY MOUTH DAILY BEFORE BREAKFAST. 90 tablet 0  . lisinopril (ZESTRIL) 2.5 MG tablet Take 2.5 mg by mouth daily.    Marland Kitchen moxifloxacin (VIGAMOX) 0.5 % ophthalmic solution Apply to eye.    . Multiple Vitamin (MULTIVITAMIN WITH MINERALS) TABS tablet Take 1 tablet by mouth daily.    Marland Kitchen neomycin-polymyxin-hydrocortisone (  CORTISPORIN) 3.5-10000-1 OTIC suspension SMARTSIG:10 Drop(s) In Ear(s) 3 Times Daily    . NIFEdipine (PROCARDIA XL/NIFEDICAL XL) 60 MG 24 hr tablet Take 60 mg by mouth daily.    . simvastatin (ZOCOR) 40 MG tablet TAKE 1 TABLET EVERY DAY (Patient taking differently: Take 40 mg by mouth daily.) 90 tablet 1  . ziprasidone (GEODON) 80 MG capsule Take 80 mg by mouth at bedtime.     No current facility-administered medications for this visit.     Past Surgical History:  Procedure Laterality Date  . ABDOMINAL HYSTERECTOMY    . CATARACT EXTRACTION W/PHACO Right 01/09/2020   Procedure: CATARACT EXTRACTION PHACO AND INTRAOCULAR LENS PLACEMENT (IOC);  Surgeon: Baruch Goldmann, MD;  Location: [facility];   Service: Ophthalmology;  Laterality: Right;  CDE: 12.01  . CATARACT EXTRACTION W/PHACO Left 01/23/2020   Procedure: CATARACT EXTRACTION PHACO AND INTRAOCULAR LENS PLACEMENT LEFT EYE;  Surgeon: Baruch Goldmann, MD;  Location: [facility];  Service: Ophthalmology;  Laterality: Left;  CDE 8.71  . COLONOSCOPY N/A 06/23/2012   Procedure: COLONOSCOPY;  Surgeon: Rogene Houston, MD;  Location: [facility];  Service: Endoscopy;  Laterality: N/A;  100-moved to 1200 Ann to notify pt  . ESOPHAGOGASTRODUODENOSCOPY N/A 05/06/2012   Procedure: ESOPHAGOGASTRODUODENOSCOPY (EGD);  Surgeon: Rogene Houston, MD;  Location: [facility];  Service: Endoscopy;  Laterality: N/A;  . MASS EXCISION Right 05/26/2012   Procedure: EXCISION NEOPLASM RIGHT THIGH;  Surgeon: Jamesetta So, MD;  Location: [facility];  Service: General;  Laterality: Right;     Allergies  Allergen Reactions  . Bee Venom Anaphylaxis  . Ace Inhibitors   . Penicillins Hives    .Has patient had a PCN reaction causing immediate rash, facial/tongue/throat swelling, SOB or lightheadedness with hypotension: Yes Has patient had a PCN reaction causing severe rash involving mucus membranes or skin necrosis: No Has patient had a PCN reaction that required hospitalization: Yes Has patient had a PCN reaction occurring within the last 10 years: No If all of the above answers are "NO", then may proceed with Cephalosporin use.       Family History  Problem Relation Age of Onset  . Diabetes Mother   . Diabetes Father   . Diabetes Sister   . Diabetes Brother   . Colon cancer Neg Hx   . Colon polyps Neg Hx      Social History Holly Baldwin reports that she has never smoked. She has never used smokeless tobacco. Holly Baldwin reports no history of alcohol use.   Review of Systems CONSTITUTIONAL: No weight loss, fever, chills, weakness or fatigue.  HEENT: Eyes: No visual loss, blurred vision, double vision or yellow sclerae.No hearing loss, sneezing, congestion,  runny nose or sore throat.  SKIN: No rash or itching.  CARDIOVASCULAR: per hpi RESPIRATORY: No shortness of breath, cough or sputum.  GASTROINTESTINAL: No anorexia, nausea, vomiting or diarrhea. No abdominal pain or blood.  GENITOURINARY: No burning on urination, no polyuria NEUROLOGICAL: No headache, dizziness, syncope, paralysis, ataxia, numbness or tingling in the extremities. No change in bowel or bladder control.  MUSCULOSKELETAL: No muscle, back pain, joint pain or stiffness.  LYMPHATICS: No enlarged nodes. No history of splenectomy.  PSYCHIATRIC: No history of depression or anxiety.  ENDOCRINOLOGIC: No reports of sweating, cold or heat intolerance. No polyuria or polydipsia.  Marland Kitchen   Physical Examination Vitals:   02/21/20 0854  BP: 134/64  Pulse: (!) 58  SpO2: 96%   Filed Weights   02/21/20 0854  Weight: 209 lb (94.8 kg)    Gen: resting comfortably, no acute distress HEENT: no scleral icterus, pupils equal round and reactive, no palptable cervical adenopathy,  CV: brady 50, no m/rg, no jvd Resp: Clear to auscultation bilaterally GI: abdomen is soft, non-tender, non-distended, normal bowel sounds, no hepatosplenomegaly MSK: extremities are warm, no edema.  Skin: warm, no rash Neuro:  no focal deficits Psych: appropriate affect   Diagnostic Studies  2D echo done in 2020 Left Ventricle: The left ventricle has normal systolic function, with an ejection fraction of 60-65%. The cavity size was normal. There is no increase in left ventricular wall thickness. Left ventricular diastolic Doppler parameters are consistent with  pseudonormalization. Elevated mean left atrial pressure       Assessment and Plan  1. Bradycardia - sinus brady 47 today, first degree av block, she has been asymptoamtic. She and husband believe she stopped diltiazem but still taking coreg, we will have her d/c coreg. Come back 2 weeks for vitals and EKG check, asked to bring all her pill bottles  with her.  2. Chronic diastolic HF - diuretics have been well managed by pcp, no current symptoms.   F/u pending nursing visit. If additional bp agent needed could use norvasc.    Arnoldo Lenis, M.D.

## 2020-02-22 ENCOUNTER — Ambulatory Visit: Payer: Medicare HMO | Admitting: Cardiology

## 2020-03-02 DIAGNOSIS — E6609 Other obesity due to excess calories: Secondary | ICD-10-CM | POA: Diagnosis not present

## 2020-03-02 DIAGNOSIS — Z6834 Body mass index (BMI) 34.0-34.9, adult: Secondary | ICD-10-CM | POA: Diagnosis not present

## 2020-03-02 DIAGNOSIS — I10 Essential (primary) hypertension: Secondary | ICD-10-CM | POA: Diagnosis not present

## 2020-03-06 ENCOUNTER — Ambulatory Visit: Payer: Medicare HMO

## 2020-03-08 ENCOUNTER — Ambulatory Visit (INDEPENDENT_AMBULATORY_CARE_PROVIDER_SITE_OTHER): Payer: Medicare HMO | Admitting: *Deleted

## 2020-03-08 VITALS — BP 134/70 | HR 72 | Ht 64.0 in | Wt 208.8 lb

## 2020-03-08 DIAGNOSIS — R001 Bradycardia, unspecified: Secondary | ICD-10-CM | POA: Diagnosis not present

## 2020-03-08 NOTE — Progress Notes (Signed)
Presents for nurse visit to have EKG and vitals checked per last office visit request. Denies dizziness, chest pain or sob. Medications reviewed then vitals and EKG done.

## 2020-03-09 NOTE — Progress Notes (Signed)
Patient informed and verbalized understanding of plan. 

## 2020-03-09 NOTE — Progress Notes (Signed)
EKG looks good, heart rates are back to normal. I do not see her vitals posted, did she have a bp taken?  Zandra Abts MD

## 2020-03-14 DIAGNOSIS — I129 Hypertensive chronic kidney disease with stage 1 through stage 4 chronic kidney disease, or unspecified chronic kidney disease: Secondary | ICD-10-CM | POA: Diagnosis not present

## 2020-03-14 DIAGNOSIS — E1129 Type 2 diabetes mellitus with other diabetic kidney complication: Secondary | ICD-10-CM | POA: Diagnosis not present

## 2020-03-14 DIAGNOSIS — N189 Chronic kidney disease, unspecified: Secondary | ICD-10-CM | POA: Diagnosis not present

## 2020-03-14 DIAGNOSIS — I5032 Chronic diastolic (congestive) heart failure: Secondary | ICD-10-CM | POA: Diagnosis not present

## 2020-03-14 DIAGNOSIS — N170 Acute kidney failure with tubular necrosis: Secondary | ICD-10-CM | POA: Diagnosis not present

## 2020-03-14 DIAGNOSIS — D631 Anemia in chronic kidney disease: Secondary | ICD-10-CM | POA: Diagnosis not present

## 2020-03-14 DIAGNOSIS — R809 Proteinuria, unspecified: Secondary | ICD-10-CM | POA: Diagnosis not present

## 2020-03-14 DIAGNOSIS — E1122 Type 2 diabetes mellitus with diabetic chronic kidney disease: Secondary | ICD-10-CM | POA: Diagnosis not present

## 2020-03-15 NOTE — Progress Notes (Signed)
Vitals look fine, no additional changes  Zandra Abts MD

## 2020-03-21 DIAGNOSIS — I129 Hypertensive chronic kidney disease with stage 1 through stage 4 chronic kidney disease, or unspecified chronic kidney disease: Secondary | ICD-10-CM | POA: Diagnosis not present

## 2020-03-21 DIAGNOSIS — R809 Proteinuria, unspecified: Secondary | ICD-10-CM | POA: Diagnosis not present

## 2020-03-21 DIAGNOSIS — D631 Anemia in chronic kidney disease: Secondary | ICD-10-CM | POA: Diagnosis not present

## 2020-03-21 DIAGNOSIS — I5032 Chronic diastolic (congestive) heart failure: Secondary | ICD-10-CM | POA: Diagnosis not present

## 2020-03-21 DIAGNOSIS — E1122 Type 2 diabetes mellitus with diabetic chronic kidney disease: Secondary | ICD-10-CM | POA: Diagnosis not present

## 2020-03-21 DIAGNOSIS — N189 Chronic kidney disease, unspecified: Secondary | ICD-10-CM | POA: Diagnosis not present

## 2020-03-21 DIAGNOSIS — E1129 Type 2 diabetes mellitus with other diabetic kidney complication: Secondary | ICD-10-CM | POA: Diagnosis not present

## 2020-03-22 ENCOUNTER — Other Ambulatory Visit (HOSPITAL_COMMUNITY): Payer: Self-pay | Admitting: Family Medicine

## 2020-03-22 DIAGNOSIS — Z1231 Encounter for screening mammogram for malignant neoplasm of breast: Secondary | ICD-10-CM

## 2020-03-31 DIAGNOSIS — Z6834 Body mass index (BMI) 34.0-34.9, adult: Secondary | ICD-10-CM | POA: Diagnosis not present

## 2020-03-31 DIAGNOSIS — E6609 Other obesity due to excess calories: Secondary | ICD-10-CM | POA: Diagnosis not present

## 2020-03-31 DIAGNOSIS — I10 Essential (primary) hypertension: Secondary | ICD-10-CM | POA: Diagnosis not present

## 2020-04-02 ENCOUNTER — Telehealth: Payer: Self-pay | Admitting: "Endocrinology

## 2020-04-02 NOTE — Telephone Encounter (Signed)
noted 

## 2020-04-02 NOTE — Telephone Encounter (Signed)
Pt called and said she was told to call her Dr and let them know she signed up for the [place]? She said she set it up with her insurance which is [facility] this year.

## 2020-04-04 ENCOUNTER — Other Ambulatory Visit: Payer: Self-pay

## 2020-04-04 DIAGNOSIS — IMO0002 Reserved for concepts with insufficient information to code with codable children: Secondary | ICD-10-CM

## 2020-04-04 DIAGNOSIS — E1165 Type 2 diabetes mellitus with hyperglycemia: Secondary | ICD-10-CM

## 2020-04-04 MED ORDER — INSULIN LISPRO (1 UNIT DIAL) 100 UNIT/ML (KWIKPEN): 10.0000 [IU] | PEN_INJECTOR | Freq: Three times a day (TID) | SUBCUTANEOUS | 1 refills | Status: DC

## 2020-04-04 NOTE — Telephone Encounter (Signed)
Script sent in and patient notified, verbalized understanding

## 2020-04-04 NOTE — Telephone Encounter (Signed)
Pt returning your call

## 2020-04-04 NOTE — Telephone Encounter (Signed)
Please advise 

## 2020-04-04 NOTE — Telephone Encounter (Signed)
Pt came into the office today and she no longer has [employer] she has [employer] and they told her she needs to change her insuline from Novalog to [place], [place] Phone:  [phone]  Fax:  [phone]

## 2020-04-04 NOTE — Telephone Encounter (Signed)
That's fine.  Change her to Humalog at the same dose as she was on of the Novolog.  They are in the same family, no real advantage of one vs the other.

## 2020-04-06 DIAGNOSIS — B359 Dermatophytosis, unspecified: Secondary | ICD-10-CM | POA: Diagnosis not present

## 2020-04-06 DIAGNOSIS — J45909 Unspecified asthma, uncomplicated: Secondary | ICD-10-CM | POA: Diagnosis not present

## 2020-04-16 ENCOUNTER — Ambulatory Visit: Payer: Medicare HMO | Admitting: Nurse Practitioner

## 2020-04-16 NOTE — Patient Instructions (Incomplete)

## 2020-04-18 ENCOUNTER — Other Ambulatory Visit: Payer: Self-pay

## 2020-04-18 ENCOUNTER — Other Ambulatory Visit (HOSPITAL_COMMUNITY)
Admission: RE | Admit: 2020-04-18 | Discharge: 2020-04-18 | Disposition: A | Discharge status: Home / Self Care | Payer: Medicare Other | Source: Ambulatory Visit | Attending: Nephrology | Admitting: Nephrology

## 2020-04-18 DIAGNOSIS — R809 Proteinuria, unspecified: Secondary | ICD-10-CM | POA: Diagnosis not present

## 2020-04-18 DIAGNOSIS — E1129 Type 2 diabetes mellitus with other diabetic kidney complication: Secondary | ICD-10-CM | POA: Diagnosis not present

## 2020-04-18 DIAGNOSIS — N189 Chronic kidney disease, unspecified: Secondary | ICD-10-CM | POA: Insufficient documentation

## 2020-04-18 DIAGNOSIS — E1122 Type 2 diabetes mellitus with diabetic chronic kidney disease: Secondary | ICD-10-CM | POA: Insufficient documentation

## 2020-04-18 DIAGNOSIS — E211 Secondary hyperparathyroidism, not elsewhere classified: Secondary | ICD-10-CM | POA: Diagnosis not present

## 2020-04-18 DIAGNOSIS — I129 Hypertensive chronic kidney disease with stage 1 through stage 4 chronic kidney disease, or unspecified chronic kidney disease: Secondary | ICD-10-CM | POA: Diagnosis not present

## 2020-04-18 DIAGNOSIS — I5032 Chronic diastolic (congestive) heart failure: Secondary | ICD-10-CM | POA: Diagnosis not present

## 2020-04-18 DIAGNOSIS — D631 Anemia in chronic kidney disease: Secondary | ICD-10-CM | POA: Diagnosis not present

## 2020-04-18 LAB — RENAL FUNCTION PANEL
Albumin: 3.9 g/dL (ref 3.5–5.0)
Anion gap: 11 (ref 5–15)
BUN: 39 mg/dL — ABNORMAL HIGH (ref 8–23)
CO2: 24 mmol/L (ref 22–32)
Calcium: 9.1 mg/dL (ref 8.9–10.3)
Chloride: 105 mmol/L (ref 98–111)
Creatinine, Ser: 1.95 mg/dL — ABNORMAL HIGH (ref 0.44–1.00)
GFR, Estimated: 26 mL/min — ABNORMAL LOW (ref >60)
Glucose, Bld: 164 mg/dL — ABNORMAL HIGH (ref 70–99)
Phosphorus: 4.4 mg/dL (ref 2.5–4.6)
Potassium: 4.1 mmol/L (ref 3.5–5.1)
Sodium: 140 mmol/L (ref 135–145)

## 2020-04-18 LAB — PROTEIN / CREATININE RATIO, URINE
Creatinine, Urine: 127.18 mg/dL
Protein Creatinine Ratio: 3.29 mg/mg{Cre} — ABNORMAL HIGH (ref 0.00–0.15)
Total Protein, Urine: 418 mg/dL

## 2020-04-18 LAB — CBC
HCT: 39.7 % (ref 36.0–46.0)
Hemoglobin: 12.1 g/dL (ref 12.0–15.0)
MCH: 26 pg (ref 26.0–34.0)
MCHC: 30.5 g/dL (ref 30.0–36.0)
MCV: 85.2 fL (ref 80.0–100.0)
Platelets: 128 10*3/uL — ABNORMAL LOW (ref 150–400)
RBC: 4.66 MIL/uL (ref 3.87–5.11)
RDW: 16.4 % — ABNORMAL HIGH (ref 11.5–15.5)
WBC: 6.7 10*3/uL (ref 4.0–10.5)
nRBC: 0 % (ref 0.0–0.2)

## 2020-04-23 ENCOUNTER — Ambulatory Visit: Payer: Medicare Other | Admitting: Nurse Practitioner

## 2020-04-30 ENCOUNTER — Ambulatory Visit (HOSPITAL_COMMUNITY)
Admission: RE | Admit: 2020-04-30 | Discharge: 2020-04-30 | Disposition: A | Discharge status: Home / Self Care | Payer: Medicare Other | Source: Ambulatory Visit | Attending: Family Medicine | Admitting: Family Medicine

## 2020-04-30 ENCOUNTER — Other Ambulatory Visit: Payer: Self-pay

## 2020-04-30 DIAGNOSIS — I10 Essential (primary) hypertension: Secondary | ICD-10-CM | POA: Diagnosis not present

## 2020-04-30 DIAGNOSIS — Z1231 Encounter for screening mammogram for malignant neoplasm of breast: Secondary | ICD-10-CM | POA: Diagnosis not present

## 2020-05-02 ENCOUNTER — Other Ambulatory Visit: Payer: Self-pay

## 2020-05-02 ENCOUNTER — Telehealth: Payer: Self-pay | Admitting: Nurse Practitioner

## 2020-05-02 DIAGNOSIS — E1165 Type 2 diabetes mellitus with hyperglycemia: Secondary | ICD-10-CM

## 2020-05-02 DIAGNOSIS — IMO0002 Reserved for concepts with insufficient information to code with codable children: Secondary | ICD-10-CM

## 2020-05-02 MED ORDER — ACCU-CHEK AVIVA PLUS VI STRP: ORAL_STRIP | 2 refills | Status: DC

## 2020-05-02 NOTE — Telephone Encounter (Signed)
It was [facility] and I sent script in

## 2020-05-02 NOTE — Telephone Encounter (Signed)
Pt called and said a "prescription lady" called her and gave her a number for [place] to call and send her test strips. [phone]. This is the only information patient could give me

## 2020-05-03 ENCOUNTER — Other Ambulatory Visit (HOSPITAL_COMMUNITY): Payer: Self-pay | Admitting: Physician Assistant

## 2020-05-03 DIAGNOSIS — R928 Other abnormal and inconclusive findings on diagnostic imaging of breast: Secondary | ICD-10-CM

## 2020-05-07 ENCOUNTER — Encounter: Payer: Self-pay | Admitting: Obstetrics & Gynecology

## 2020-05-07 ENCOUNTER — Other Ambulatory Visit: Payer: Self-pay

## 2020-05-07 ENCOUNTER — Ambulatory Visit (INDEPENDENT_AMBULATORY_CARE_PROVIDER_SITE_OTHER): Payer: Medicare Other | Admitting: Obstetrics & Gynecology

## 2020-05-07 VITALS — BP 184/84 | HR 74 | Ht 64.0 in | Wt 212.0 lb

## 2020-05-07 DIAGNOSIS — N8110 Cystocele, unspecified: Secondary | ICD-10-CM

## 2020-05-07 DIAGNOSIS — N816 Rectocele: Secondary | ICD-10-CM

## 2020-05-07 DIAGNOSIS — N611 Abscess of the breast and nipple: Secondary | ICD-10-CM | POA: Diagnosis not present

## 2020-05-07 MED ORDER — SULFAMETHOXAZOLE-TRIMETHOPRIM 800-160 MG PO TABS: 1.0000 | ORAL_TABLET | Freq: Two times a day (BID) | ORAL | 0 refills | Status: DC

## 2020-05-07 MED ORDER — SILVER SULFADIAZINE 1 % EX CREA: TOPICAL_CREAM | CUTANEOUS | 11 refills | Status: DC

## 2020-05-07 NOTE — Progress Notes (Signed)
Chief Complaint  Patient presents with  . lesion on side labia    Place on right side chest       75 y.o. No obstetric history on file. No LMP recorded. Patient has had a hysterectomy. The current method of family planning is status post hysterectomy.  Outpatient Encounter Medications as of 05/07/2020  Medication Sig Note  . gabapentin (NEURONTIN) 100 MG capsule TAKE 1 CAPSULE TWICE DAILY   . glipiZIDE (GLUCOTROL) 5 MG tablet Take by mouth daily before breakfast.   . glucose blood (ACCU-CHEK AVIVA PLUS) test strip TEST BLOOD GLUCOSE FOUR TIMES DAILY   . hydrALAZINE (APRESOLINE) 100 MG tablet Take 1 tablet (100 mg total) by mouth 3 (three) times daily.   . insulin glargine (LANTUS SOLOSTAR) 100 UNIT/ML Solostar Pen Inject 40 Units into the skin at bedtime. 01/23/2020: 10 units  taken  . insulin lispro (HUMALOG) 100 UNIT/ML KwikPen Inject 10 Units into the skin 3 (three) times daily. With meals   . isosorbide mononitrate (IMDUR) 60 MG 24 hr tablet Take 1 tablet (60 mg total) by mouth daily.   Marland Kitchen levothyroxine (SYNTHROID) 137 MCG tablet TAKE 1 TABLET (137 MCG TOTAL) BY MOUTH DAILY BEFORE BREAKFAST.   Marland Kitchen lisinopril (ZESTRIL) 2.5 MG tablet Take 2.5 mg by mouth daily.   . Multiple Vitamin (MULTIVITAMIN WITH MINERALS) TABS tablet Take 1 tablet by mouth daily.   Marland Kitchen NIFEdipine (PROCARDIA XL/NIFEDICAL XL) 60 MG 24 hr tablet Take 60 mg by mouth daily.   . silver sulfADIAZINE (SILVADENE) 1 % cream Apply to area on the breast  3 times daily   . simvastatin (ZOCOR) 40 MG tablet TAKE 1 TABLET EVERY DAY (Patient taking differently: Take 40 mg by mouth daily.)   . sulfamethoxazole-trimethoprim (BACTRIM DS) 800-160 MG tablet Take 1 tablet by mouth 2 (two) times daily.   . ziprasidone (GEODON) 80 MG capsule Take 80 mg by mouth 2 (two) times daily with a meal.   . albuterol (PROVENTIL HFA;VENTOLIN HFA) 108 (90 Base) MCG/ACT inhaler Inhale 1-2 puffs into the lungs every 6 (six) hours as needed for  wheezing or shortness of breath.    Marland Kitchen aspirin EC 81 MG tablet Take 81 mg by mouth at bedtime.   Marland Kitchen EPINEPHrine (EPI-PEN) 0.3 mg/0.3 mL DEVI Inject 0.3 mg into the muscle once.   . furosemide (LASIX) 40 MG tablet Take 40 mg by mouth 2 (two) times daily. 1/6/2022Theador Hawthorne  . moxifloxacin (VIGAMOX) 0.5 % ophthalmic solution Apply to eye. (Patient not taking: Reported on 05/07/2020)    No facility-administered encounter medications on file as of 05/07/2020.    Subjective Pt was originally referred for evaluation of a "lesion of the right labia" She was given 3 diflucan tablets and lotrisone cream and the area has resolved  On exam her vulva is normal, no lesions are noted  However she has developed a draining cyst of her right breast since that appointment It has been present about 1 month Tender  Draining Warm to touch Never had it before she states Mammogram 04/30/20 did not reveal it so it must be localized to the skin Past Medical History:  Diagnosis Date  . Anxiety   . Chronic abdominal pain   . Diabetes mellitus   . GERD (gastroesophageal reflux disease)   . Hiatal hernia   . Hypercholesteremia   . Hypertension     Past Surgical History:  Procedure Laterality Date  . ABDOMINAL HYSTERECTOMY    . CATARACT EXTRACTION  W/PHACO Right 01/09/2020   Procedure: CATARACT EXTRACTION PHACO AND INTRAOCULAR LENS PLACEMENT (IOC);  Surgeon: Baruch Goldmann, MD;  Location: [facility];  Service: Ophthalmology;  Laterality: Right;  CDE: 12.01  . CATARACT EXTRACTION W/PHACO Left 01/23/2020   Procedure: CATARACT EXTRACTION PHACO AND INTRAOCULAR LENS PLACEMENT LEFT EYE;  Surgeon: Baruch Goldmann, MD;  Location: [facility];  Service: Ophthalmology;  Laterality: Left;  CDE 8.71  . COLONOSCOPY N/A 06/23/2012   Procedure: COLONOSCOPY;  Surgeon: Rogene Houston, MD;  Location: [facility];  Service: Endoscopy;  Laterality: N/A;  100-moved to 1200 Ann to notify pt  . ESOPHAGOGASTRODUODENOSCOPY N/A 05/06/2012    Procedure: ESOPHAGOGASTRODUODENOSCOPY (EGD);  Surgeon: Rogene Houston, MD;  Location: [facility];  Service: Endoscopy;  Laterality: N/A;  . MASS EXCISION Right 05/26/2012   Procedure: EXCISION NEOPLASM RIGHT THIGH;  Surgeon: Jamesetta So, MD;  Location: [facility];  Service: General;  Laterality: Right;    OB History   No obstetric history on file.     Allergies  Allergen Reactions  . Bee Venom Anaphylaxis  . Ace Inhibitors   . Penicillins Hives    .Has patient had a PCN reaction causing immediate rash, facial/tongue/throat swelling, SOB or lightheadedness with hypotension: Yes Has patient had a PCN reaction causing severe rash involving mucus membranes or skin necrosis: No Has patient had a PCN reaction that required hospitalization: Yes Has patient had a PCN reaction occurring within the last 10 years: No If all of the above answers are "NO", then may proceed with Cephalosporin use.     Social History   Socioeconomic History  . Marital status: Married    Spouse name: Not on file  . Number of children: Not on file  . Years of education: Not on file  . Highest education level: Not on file  Occupational History  . Not on file  Tobacco Use  . Smoking status: Never Smoker  . Smokeless tobacco: Never Used  Vaping Use  . Vaping Use: Never used  Substance and Sexual Activity  . Alcohol use: No  . Drug use: No  . Sexual activity: Not Currently    Birth control/protection: Post-menopausal  Other Topics Concern  . Not on file  Social History Narrative  . Not on file   Social Determinants of Health   Financial Resource Strain: High Risk  . Difficulty of Paying Living Expenses: Very hard  Food Insecurity: No Food Insecurity  . Worried About Charity fundraiser in the Last Year: Never true  . Ran Out of Food in the Last Year: Never true  Transportation Needs: No Transportation Needs  . Lack of Transportation (Medical): No  . Lack of Transportation (Non-Medical): No   Physical Activity: Inactive  . Days of Exercise per Week: 0 days  . Minutes of Exercise per Session: 0 min  Stress: No Stress Concern Present  . Feeling of Stress : Not at all  Social Connections: Socially Isolated  . Frequency of Communication with Friends and Family: Once a week  . Frequency of Social Gatherings with Friends and Family: Never  . Attends Religious Services: Never  . Active Member of Clubs or Organizations: No  . Attends Archivist Meetings: Never  . Marital Status: Married    Family History  Problem Relation Age of Onset  . Diabetes Mother   . Diabetes Father   . Stroke Father   . Diabetes Sister   . Diabetes Brother   . Colon cancer Neg  Hx   . Colon polyps Neg Hx     Medications:       Current Outpatient Medications:  .  gabapentin (NEURONTIN) 100 MG capsule, TAKE 1 CAPSULE TWICE DAILY, Disp: 180 capsule, Rfl: 0 .  glipiZIDE (GLUCOTROL) 5 MG tablet, Take by mouth daily before breakfast., Disp: , Rfl:  .  glucose blood (ACCU-CHEK AVIVA PLUS) test strip, TEST BLOOD GLUCOSE FOUR TIMES DAILY, Disp: 400 strip, Rfl: 2 .  hydrALAZINE (APRESOLINE) 100 MG tablet, Take 1 tablet (100 mg total) by mouth 3 (three) times daily., Disp: 90 tablet, Rfl: 3 .  insulin glargine (LANTUS SOLOSTAR) 100 UNIT/ML Solostar Pen, Inject 40 Units into the skin at bedtime., Disp: 45 mL, Rfl: 1 .  insulin lispro (HUMALOG) 100 UNIT/ML KwikPen, Inject 10 Units into the skin 3 (three) times daily. With meals, Disp: 15 mL, Rfl: 1 .  isosorbide mononitrate (IMDUR) 60 MG 24 hr tablet, Take 1 tablet (60 mg total) by mouth daily., Disp: 30 tablet, Rfl: 3 .  levothyroxine (SYNTHROID) 137 MCG tablet, TAKE 1 TABLET (137 MCG TOTAL) BY MOUTH DAILY BEFORE BREAKFAST., Disp: 90 tablet, Rfl: 0 .  lisinopril (ZESTRIL) 2.5 MG tablet, Take 2.5 mg by mouth daily., Disp: , Rfl:  .  Multiple Vitamin (MULTIVITAMIN WITH MINERALS) TABS tablet, Take 1 tablet by mouth daily., Disp: , Rfl:  .  NIFEdipine  (PROCARDIA XL/NIFEDICAL XL) 60 MG 24 hr tablet, Take 60 mg by mouth daily., Disp: , Rfl:  .  silver sulfADIAZINE (SILVADENE) 1 % cream, Apply to area on the breast  3 times daily, Disp: 50 g, Rfl: 11 .  simvastatin (ZOCOR) 40 MG tablet, TAKE 1 TABLET EVERY DAY (Patient taking differently: Take 40 mg by mouth daily.), Disp: 90 tablet, Rfl: 1 .  sulfamethoxazole-trimethoprim (BACTRIM DS) 800-160 MG tablet, Take 1 tablet by mouth 2 (two) times daily., Disp: 14 tablet, Rfl: 0 .  ziprasidone (GEODON) 80 MG capsule, Take 80 mg by mouth 2 (two) times daily with a meal., Disp: , Rfl:  .  albuterol (PROVENTIL HFA;VENTOLIN HFA) 108 (90 Base) MCG/ACT inhaler, Inhale 1-2 puffs into the lungs every 6 (six) hours as needed for wheezing or shortness of breath. , Disp: , Rfl:  .  aspirin EC 81 MG tablet, Take 81 mg by mouth at bedtime., Disp: , Rfl:  .  EPINEPHrine (EPI-PEN) 0.3 mg/0.3 mL DEVI, Inject 0.3 mg into the muscle once., Disp: , Rfl:  .  furosemide (LASIX) 40 MG tablet, Take 40 mg by mouth 2 (two) times daily., Disp: , Rfl:  .  moxifloxacin (VIGAMOX) 0.5 % ophthalmic solution, Apply to eye. (Patient not taking: Reported on 05/07/2020), Disp: , Rfl:   Objective Blood pressure (!) 184/84, pulse 74, height 5\' 4"  (1.626 m), weight 212 lb (96.2 kg).  Gen [facility]N female NAD 2 cm right breast skin lesion which is draining a bit, appears to be a sebaceous cyst originally Normal external genitalia no lesions noted anywhere GrADE 3 cystocoele and grade 2 rectocoele, both of which pt is asymptomatic with Cuff intact normal  Pertinent ROS No burning with urination, frequency or urgency No nausea, vomiting or diarrhea Nor fever chills or other constitutional symptoms   Labs or studies Reviewed mammogram    Impression Diagnoses this Encounter::   ICD-10-CM   1. Boil, breast  N61.1   2. POP-Q stage 3 cystocele  N81.10   3. Rectocele  N81.6     Established relevant  diagnosis(es):   Plan/Recommendations: Meds ordered this  encounter  Medications  . sulfamethoxazole-trimethoprim (BACTRIM DS) 800-160 MG tablet    Sig: Take 1 tablet by mouth 2 (two) times daily.    Dispense:  14 tablet    Refill:  0  . silver sulfADIAZINE (SILVADENE) 1 % cream    Sig: Apply to area on the breast  3 times daily    Dispense:  50 g    Refill:  11    Labs or Scans Ordered: No orders of the defined types were placed in this encounter.   Management:: Prescribed oral antibiotic and topical silvadene cream for the boil of the right breast  Pt informed she has a cystocoele/recocoele but she is asymptomatic at present, a pessary would be appropriate for manaegement if needed  Referred to General Surgery colleagues for removal of the infected breast boil/sebaceosu cyst  Follow up Return if symptoms worsen or fail to improve.        All questions were answered.

## 2020-05-09 ENCOUNTER — Other Ambulatory Visit: Payer: Self-pay | Admitting: *Deleted

## 2020-05-09 DIAGNOSIS — E1165 Type 2 diabetes mellitus with hyperglycemia: Secondary | ICD-10-CM | POA: Diagnosis not present

## 2020-05-09 DIAGNOSIS — N611 Abscess of the breast and nipple: Secondary | ICD-10-CM

## 2020-05-09 DIAGNOSIS — E039 Hypothyroidism, unspecified: Secondary | ICD-10-CM | POA: Diagnosis not present

## 2020-05-09 DIAGNOSIS — E7849 Other hyperlipidemia: Secondary | ICD-10-CM | POA: Diagnosis not present

## 2020-05-09 DIAGNOSIS — E118 Type 2 diabetes mellitus with unspecified complications: Secondary | ICD-10-CM | POA: Diagnosis not present

## 2020-05-09 DIAGNOSIS — Z794 Long term (current) use of insulin: Secondary | ICD-10-CM | POA: Diagnosis not present

## 2020-05-10 LAB — COMPREHENSIVE METABOLIC PANEL
ALT: 15 IU/L (ref 0–32)
AST: 15 IU/L (ref 0–40)
Albumin/Globulin Ratio: 1.5 (ref 1.2–2.2)
Albumin: 4 g/dL (ref 3.7–4.7)
Alkaline Phosphatase: 137 IU/L — ABNORMAL HIGH (ref 44–121)
BUN/Creatinine Ratio: 15 (ref 12–28)
BUN: 34 mg/dL — ABNORMAL HIGH (ref 8–27)
Bilirubin Total: 0.2 mg/dL (ref 0.0–1.2)
CO2: 25 mmol/L (ref 20–29)
Calcium: 9.1 mg/dL (ref 8.7–10.3)
Chloride: 104 mmol/L (ref 96–106)
Creatinine, Ser: 2.28 mg/dL — ABNORMAL HIGH (ref 0.57–1.00)
Globulin, Total: 2.6 g/dL (ref 1.5–4.5)
Glucose: 165 mg/dL — ABNORMAL HIGH (ref 65–99)
Potassium: 4 mmol/L (ref 3.5–5.2)
Sodium: 145 mmol/L — ABNORMAL HIGH (ref 134–144)
Total Protein: 6.6 g/dL (ref 6.0–8.5)
eGFR: 22 mL/min/{1.73_m2} — ABNORMAL LOW (ref >59)

## 2020-05-10 LAB — LIPID PANEL
Chol/HDL Ratio: 4.4 ratio (ref 0.0–4.4)
Cholesterol, Total: 184 mg/dL (ref 100–199)
HDL: 42 mg/dL (ref >39)
LDL Chol Calc (NIH): 108 mg/dL — ABNORMAL HIGH (ref 0–99)
Triglycerides: 197 mg/dL — ABNORMAL HIGH (ref 0–149)
VLDL Cholesterol Cal: 34 mg/dL (ref 5–40)

## 2020-05-10 LAB — T4, FREE: Free T4: 1.4 ng/dL (ref 0.82–1.77)

## 2020-05-10 LAB — TSH: TSH: 0.676 u[IU]/mL (ref 0.450–4.500)

## 2020-05-11 ENCOUNTER — Other Ambulatory Visit: Payer: Self-pay

## 2020-05-11 ENCOUNTER — Ambulatory Visit (INDEPENDENT_AMBULATORY_CARE_PROVIDER_SITE_OTHER): Payer: Medicare Other | Admitting: Nurse Practitioner

## 2020-05-11 ENCOUNTER — Encounter: Payer: Self-pay | Admitting: Nurse Practitioner

## 2020-05-11 VITALS — BP 176/72 | HR 69 | Ht 64.0 in | Wt 211.8 lb

## 2020-05-11 DIAGNOSIS — E039 Hypothyroidism, unspecified: Secondary | ICD-10-CM | POA: Diagnosis not present

## 2020-05-11 DIAGNOSIS — E118 Type 2 diabetes mellitus with unspecified complications: Secondary | ICD-10-CM

## 2020-05-11 DIAGNOSIS — E1165 Type 2 diabetes mellitus with hyperglycemia: Secondary | ICD-10-CM

## 2020-05-11 DIAGNOSIS — I10 Essential (primary) hypertension: Secondary | ICD-10-CM | POA: Diagnosis not present

## 2020-05-11 DIAGNOSIS — E782 Mixed hyperlipidemia: Secondary | ICD-10-CM

## 2020-05-11 DIAGNOSIS — Z794 Long term (current) use of insulin: Secondary | ICD-10-CM | POA: Diagnosis not present

## 2020-05-11 DIAGNOSIS — IMO0002 Reserved for concepts with insufficient information to code with codable children: Secondary | ICD-10-CM

## 2020-05-11 DIAGNOSIS — E119 Type 2 diabetes mellitus without complications: Secondary | ICD-10-CM | POA: Diagnosis not present

## 2020-05-11 LAB — POCT GLYCOSYLATED HEMOGLOBIN (HGB A1C): HbA1c, POC (controlled diabetic range): 6.9 % (ref 0.0–7.0)

## 2020-05-11 NOTE — Patient Instructions (Signed)

## 2020-05-11 NOTE — Progress Notes (Signed)
05/11/2020      Endocrinology follow-up note   Subjective:    Patient ID: Holly Baldwin, female    DOB: 07/06/1945,    Past Medical History:  Diagnosis Date  . Anxiety   . Chronic abdominal pain   . Diabetes mellitus   . GERD (gastroesophageal reflux disease)   . Hiatal hernia   . Hypercholesteremia   . Hypertension    Past Surgical History:  Procedure Laterality Date  . ABDOMINAL HYSTERECTOMY    . CATARACT EXTRACTION W/PHACO Right 01/09/2020   Procedure: CATARACT EXTRACTION PHACO AND INTRAOCULAR LENS PLACEMENT (IOC);  Surgeon: Baruch Goldmann, MD;  Location: [facility];  Service: Ophthalmology;  Laterality: Right;  CDE: 12.01  . CATARACT EXTRACTION W/PHACO Left 01/23/2020   Procedure: CATARACT EXTRACTION PHACO AND INTRAOCULAR LENS PLACEMENT LEFT EYE;  Surgeon: Baruch Goldmann, MD;  Location: [facility];  Service: Ophthalmology;  Laterality: Left;  CDE 8.71  . COLONOSCOPY N/A 06/23/2012   Procedure: COLONOSCOPY;  Surgeon: Rogene Houston, MD;  Location: [facility];  Service: Endoscopy;  Laterality: N/A;  100-moved to 1200 Ann to notify pt  . ESOPHAGOGASTRODUODENOSCOPY N/A 05/06/2012   Procedure: ESOPHAGOGASTRODUODENOSCOPY (EGD);  Surgeon: Rogene Houston, MD;  Location: [facility];  Service: Endoscopy;  Laterality: N/A;  . MASS EXCISION Right 05/26/2012   Procedure: EXCISION NEOPLASM RIGHT THIGH;  Surgeon: Jamesetta So, MD;  Location: [facility];  Service: General;  Laterality: Right;   Social History   Socioeconomic History  . Marital status: Married    Spouse name: Not on file  . Number of children: Not on file  . Years of education: Not on file  . Highest education level: Not on file  Occupational History  . Not on file  Tobacco Use  . Smoking status: Never Smoker  . Smokeless tobacco: Never Used  Vaping Use  . Vaping Use: Never used  Substance and Sexual Activity  . Alcohol use: No  . Drug use: No  . Sexual activity: Not Currently    Birth control/protection:  Post-menopausal  Other Topics Concern  . Not on file  Social History Narrative  . Not on file   Social Determinants of Health   Financial Resource Strain: High Risk  . Difficulty of Paying Living Expenses: Very hard  Food Insecurity: No Food Insecurity  . Worried About Charity fundraiser in the Last Year: Never true  . Ran Out of Food in the Last Year: Never true  Transportation Needs: No Transportation Needs  . Lack of Transportation (Medical): No  . Lack of Transportation (Non-Medical): No  Physical Activity: Inactive  . Days of Exercise per Week: 0 days  . Minutes of Exercise per Session: 0 min  Stress: No Stress Concern Present  . Feeling of Stress : Not at all  Social Connections: Socially Isolated  . Frequency of Communication with Friends and Family: Once a week  . Frequency of Social Gatherings with Friends and Family: Never  . Attends Religious Services: Never  . Active Member of Clubs or Organizations: No  . Attends Archivist Meetings: Never  . Marital Status: Married   Outpatient Encounter Medications as of 05/11/2020  Medication Sig  . albuterol (PROVENTIL HFA;VENTOLIN HFA) 108 (90 Base) MCG/ACT inhaler Inhale 1-2 puffs into the lungs every 6 (six) hours as needed for wheezing or shortness of breath.   Marland Kitchen aspirin EC 81 MG tablet Take 81 mg by mouth at bedtime.  . Blood Glucose Monitoring Suppl (  ACCU-CHEK AVIVA PLUS) w/Device KIT   . carvedilol (COREG) 25 MG tablet   . clotrimazole-betamethasone (LOTRISONE) cream Apply topically 2 (two) times daily.  Marland Kitchen EPINEPHrine (EPI-PEN) 0.3 mg/0.3 mL DEVI Inject 0.3 mg into the muscle once.  . furosemide (LASIX) 20 MG tablet Take 20 mg by mouth 2 (two) times daily.  Marland Kitchen gabapentin (NEURONTIN) 100 MG capsule TAKE 1 CAPSULE TWICE DAILY  . glipiZIDE (GLUCOTROL) 5 MG tablet Take by mouth daily before breakfast.  . glucose blood (ACCU-CHEK AVIVA PLUS) test strip TEST BLOOD GLUCOSE FOUR TIMES DAILY  . hydrALAZINE (APRESOLINE)  100 MG tablet Take 1 tablet (100 mg total) by mouth 3 (three) times daily.  . insulin glargine (LANTUS SOLOSTAR) 100 UNIT/ML Solostar Pen Inject 40 Units into the skin at bedtime.  . insulin lispro (HUMALOG) 100 UNIT/ML KwikPen Inject 10 Units into the skin 3 (three) times daily. With meals  . isosorbide mononitrate (IMDUR) 60 MG 24 hr tablet Take 1 tablet (60 mg total) by mouth daily.  Marland Kitchen levothyroxine (SYNTHROID) 137 MCG tablet TAKE 1 TABLET (137 MCG TOTAL) BY MOUTH DAILY BEFORE BREAKFAST.  Marland Kitchen lisinopril (ZESTRIL) 5 MG tablet Take 5 mg by mouth daily.  Marland Kitchen moxifloxacin (VIGAMOX) 0.5 % ophthalmic solution Apply to eye.  . Multiple Vitamin (MULTIVITAMIN WITH MINERALS) TABS tablet Take 1 tablet by mouth daily.  Marland Kitchen NIFEdipine (PROCARDIA XL/NIFEDICAL XL) 60 MG 24 hr tablet Take 60 mg by mouth daily.  . silver sulfADIAZINE (SILVADENE) 1 % cream Apply to area on the breast  3 times daily  . simvastatin (ZOCOR) 40 MG tablet TAKE 1 TABLET EVERY DAY (Patient taking differently: Take 40 mg by mouth daily.)  . sulfamethoxazole-trimethoprim (BACTRIM DS) 800-160 MG tablet Take 1 tablet by mouth 2 (two) times daily.  . ziprasidone (GEODON) 80 MG capsule Take 80 mg by mouth 2 (two) times daily with a meal.  . [DISCONTINUED] furosemide (LASIX) 40 MG tablet Take 40 mg by mouth 2 (two) times daily.  . [DISCONTINUED] lisinopril (ZESTRIL) 2.5 MG tablet Take 2.5 mg by mouth daily.   No facility-administered encounter medications on file as of 05/11/2020.   ALLERGIES: Allergies  Allergen Reactions  . Bee Venom Anaphylaxis  . Ace Inhibitors   . Penicillins Hives    .Has patient had a PCN reaction causing immediate rash, facial/tongue/throat swelling, SOB or lightheadedness with hypotension: Yes Has patient had a PCN reaction causing severe rash involving mucus membranes or skin necrosis: No Has patient had a PCN reaction that required hospitalization: Yes Has patient had a PCN reaction occurring within the last 10  years: No If all of the above answers are "NO", then may proceed with Cephalosporin use.    VACCINATION STATUS:  There is no immunization history on file for this patient.  Diabetes She presents for her follow-up diabetic visit. She has type 2 diabetes mellitus. Onset time: she was diagnosed at approximate age of 87 years. Her disease course has been stable. There are no hypoglycemic associated symptoms. Pertinent negatives for hypoglycemia include no confusion, headaches, nervousness/anxiousness, pallor, seizures or tremors. Pertinent negatives for diabetes include no chest pain, no fatigue, no polydipsia, no polyphagia, no polyuria and no weight loss. There are no hypoglycemic complications. Symptoms are stable. Diabetic complications include nephropathy. Risk factors for coronary artery disease include diabetes mellitus, dyslipidemia, obesity, sedentary lifestyle and hypertension. Current diabetic treatment includes intensive insulin program. She is compliant with treatment most of the time. Her weight is fluctuating minimally. She is following a generally unhealthy  diet. When asked about meal planning, she reported none. She has not had a previous visit with a dietitian. She never participates in exercise. Her home blood glucose trend is fluctuating minimally. Her breakfast blood glucose range is generally 140-180 mg/dl. Her lunch blood glucose range is generally 130-140 mg/dl. Her dinner blood glucose range is generally 130-140 mg/dl. (She presents today with her meter and logs showing stable glycemic profile overall.  Her POCT A1c today is 6.9%, increasing slightly from previous visit of 6.4%.  There are no episodes of hypoglycemia noted.) An ACE inhibitor/angiotensin II receptor blocker is being taken. She does not see a podiatrist.Eye exam is current.  Thyroid Problem Presents for follow-up (She is currently on levothyroxine 137 mcg p.o. nightly.  She reports compliance with medication.) visit.  Patient reports no anxiety, cold intolerance, constipation, depressed mood, diarrhea, fatigue, heat intolerance, palpitations, tremors, weight gain or weight loss. The symptoms have been stable. Past treatments include levothyroxine. Her past medical history is significant for diabetes and hyperlipidemia.  Hyperlipidemia This is a chronic problem. The current episode started more than 1 year ago. The problem is uncontrolled. Recent lipid tests were reviewed and are high. Exacerbating diseases include chronic renal disease, diabetes and obesity. Factors aggravating her hyperlipidemia include beta blockers and fatty foods. Pertinent negatives include no chest pain, myalgias or shortness of breath. Current antihyperlipidemic treatment includes statins. The current treatment provides mild improvement of lipids. Compliance problems include adherence to diet and adherence to exercise.  Risk factors for coronary artery disease include dyslipidemia, diabetes mellitus, hypertension, obesity, a sedentary lifestyle and post-menopausal.  Hypertension This is a chronic problem. The current episode started more than 1 year ago. The problem is unchanged. The problem is uncontrolled. Pertinent negatives include no chest pain, headaches, palpitations or shortness of breath. Agents associated with hypertension include thyroid hormones. Risk factors for coronary artery disease include dyslipidemia, diabetes mellitus, obesity, post-menopausal state and sedentary lifestyle. Past treatments include calcium channel blockers, central alpha agonists and diuretics. The current treatment provides mild improvement. Compliance problems include exercise and diet.  Hypertensive end-organ damage includes kidney disease. Identifiable causes of hypertension include chronic renal disease and a thyroid problem.      Review of systems  Constitutional: + Minimally fluctuating body weight,  current Body mass index is 36.36 kg/m. , no fatigue,  no subjective hyperthermia, no subjective hypothermia Eyes: no blurry vision, no xerophthalmia ENT: no sore throat, no nodules palpated in throat, no dysphagia/odynophagia, no hoarseness Cardiovascular: no chest pain, no shortness of breath, no palpitations, no leg swelling Respiratory: no cough, no shortness of breath Gastrointestinal: no nausea/vomiting/diarrhea Musculoskeletal: no muscle/joint aches Skin: no rashes, no hyperemia Neurological: no tremors, no numbness, no tingling, no dizziness Psychiatric: no depression, no anxiety   Objective:    BP (!) 176/72 (BP Location: Right Arm, Patient Position: Sitting)   Pulse 69   Ht 5' 4" (1.626 m)   Wt 211 lb 12.8 oz (96.1 kg)   BMI 36.36 kg/m   Wt Readings from Last 3 Encounters:  05/11/20 211 lb 12.8 oz (96.1 kg)  05/07/20 212 lb (96.2 kg)  03/08/20 208 lb 12.8 oz (94.7 kg)    BP Readings from Last 3 Encounters:  05/11/20 (!) 176/72  05/07/20 (!) 184/84  03/08/20 134/70     Physical Exam- Limited  Constitutional:  Body mass index is 36.36 kg/m. , not in acute distress, normal state of mind, ? Memory impairment? Eyes:  EOMI, no exophthalmos Neck: Supple Cardiovascular: RRR,  no murmers, rubs, or gallops, no edema Respiratory: Adequate breathing efforts, no crackles, rales, rhonchi, or wheezing Musculoskeletal: no gross deformities, strength intact in all four extremities, no gross restriction of joint movements Skin:  no rashes, no hyperemia Neurological: no tremor with outstretched hands    Results for orders placed or performed in visit on 05/11/20  HgB A1c  Result Value Ref Range   Hemoglobin A1C     HbA1c POC (<> result, manual entry)     HbA1c, POC (prediabetic range)     HbA1c, POC (controlled diabetic range) 6.9 0.0 - 7.0 %   Diabetic Labs (most recent): Lab Results  Component Value Date   HGBA1C 6.9 05/11/2020   HGBA1C 6.4 (A) 12/12/2019   HGBA1C 6.4 (A) 08/09/2019   Lipid Panel     Component Value  Date/Time   CHOL 184 05/09/2020 0932   TRIG 197 (H) 05/09/2020 0932   HDL 42 05/09/2020 0932   CHOLHDL 4.4 05/09/2020 0932   CHOLHDL 2.3 08/21/2018 0450   VLDL 21 08/21/2018 0450   LDLCALC 108 (H) 05/09/2020 0932   LDLCALC 105 (H) 12/14/2017 0921     Assessment & Plan:   1) Uncontrolled type 2 diabetes mellitus with complication  Her diabetes is  complicated by CAD,  neuropathy , microalbuminuria, worsening CKD,  and patient remains at a high risk for more acute and chronic complications of diabetes which include CAD, CVA, CKD, retinopathy, and neuropathy. These are all discussed in detail with the patient.  She presents today with her meter and logs showing stable glycemic profile overall.  Her POCT A1c today is 6.9%, increasing slightly from previous visit of 6.4%.  There are no episodes of hypoglycemia noted.  - Nutritional counseling repeated at each appointment due to patients tendency to fall back in to old habits.  - The patient admits there is a room for improvement in their diet and drink choices. -  Suggestion is made for the patient to avoid simple carbohydrates from their diet including Cakes, Sweet Desserts / Pastries, Ice Cream, Soda (diet and regular), Sweet Tea, Candies, Chips, Cookies, Sweet Pastries,  Store Bought Juices, Alcohol in Excess of  1-2 drinks a day, Artificial Sweeteners, Coffee Creamer, and "Sugar-free" Products. This will help patient to have stable blood glucose profile and potentially avoid unintended weight gain.   - I encouraged the patient to switch to  unprocessed or minimally processed complex starch and increased protein intake (animal or plant source), fruits, and vegetables.   - Patient is advised to stick to a routine mealtimes to eat 3 meals  a day and avoid unnecessary snacks ( to snack only to correct hypoglycemia).  - I have approached patient with the following individualized plan to manage diabetes and patient agrees.  She will continue  to require intensive treatment with basal/bolus insulin in order for her to achieve and maintain control of diabetes to target.    -Given her stable glycemic profile, she is advised to continue the same medication regimen.  She is advised to continue Lantus 40 units SQ daily at bedtime, Novolog 10-16 units TID with meals if glucose over 90 and she is eating.  Specific instructions on how to titrate insulin dose based on glucose readings given to patient in writing.  -she is encouraged to continue monitoring blood glucose at least 4 times daily, before meals and at bedtime and call the clinic if she has readings less than 70 or greater than 200 for 3 tests in  a row.  - Patient specific target  for A1c; LDL, HDL, Triglycerides, and  Waist Circumference were discussed in detail.  2) BP/HTN:  Her blood pressure is not controlled to target.  She is advised to continue Norvasc 10 mg po daily, Coreg 25 mg po twice daily, Lasix 40 mg po daily, Hydralazine 100 mg po daily, and Isosorbide 60 mg po daily.  Will defer med changes to nephrologist.  3) Lipids/HPL: Her most recent lipid panel on 05/09/20 shows uncontrolled LDL at 108 and elevated triglycerides of 197.Marland Kitchen  She is advised to continue Atorvastatin 40 mg po daily at bedtime.  Side effects and precautions discussed with her.    4)  Weight/Diet:  Her Body mass index is 36.36 kg/m. She will benefit from continued modest weight loss.  CDE consult in progress, exercise, and carbohydrates information provided.  5) Hypothyroidism: -Her previsit TFTs are consistent with appropriate hormone replacement.  She is advised to continue Levothyroxine 137 mg po daily before breakfast.    - We discussed about the correct intake of her thyroid hormone, on empty stomach at fasting, with water, separated by at least 30 minutes from breakfast and other medications,  and separated by more than 4 hours from calcium, iron, multivitamins, acid reflux medications  (PPIs). -Patient is made aware of the fact that thyroid hormone replacement is needed for life, dose to be adjusted by periodic monitoring of thyroid function tests.  6) Chronic Care/Health Maintenance: -Patient is on Statin medications and encouraged to continue to follow up with Ophthalmology, Podiatrist at least yearly or according to recommendations, and advised to  stay away from smoking. I have recommended yearly flu vaccine and pneumonia vaccination at least every 5 years; moderate intensity exercise for up to 150 minutes weekly; and  sleep for at least 7 hours a day.  I advised patient to maintain close follow up with her PCP for primary care needs.  - Time spent on this patient care encounter:  40 min, of which > 50% was spent in  counseling and the rest reviewing her blood glucose logs , discussing her hypoglycemia and hyperglycemia episodes, reviewing her current and  previous labs / studies  ( including abstraction from other facilities) and medications  doses and developing a  long term treatment plan and documenting her care.   Please refer to Patient Instructions for Blood Glucose Monitoring and Insulin/Medications Dosing Guide"  in media tab for additional information. Please  also refer to " Patient Self Inventory" in the Media  tab for reviewed elements of pertinent patient history.  Holly Baldwin participated in the discussions, expressed understanding, and voiced agreement with the above plans.  All questions were answered to her satisfaction. she is encouraged to contact clinic should she have any questions or concerns prior to her return visit.    Follow up plan: Return in about 6 months (around 11/11/2020) for Diabetes follow up, Thyroid follow up, Previsit labs.  Rayetta Pigg, [facility] [address] [place], [place] [address] Phone: [phone] Fax: [phone]  05/11/2020, 11:33 AM

## 2020-05-16 DIAGNOSIS — E119 Type 2 diabetes mellitus without complications: Secondary | ICD-10-CM | POA: Diagnosis not present

## 2020-05-16 DIAGNOSIS — G43109 Migraine with aura, not intractable, without status migrainosus: Secondary | ICD-10-CM | POA: Diagnosis not present

## 2020-05-16 DIAGNOSIS — Z0001 Encounter for general adult medical examination with abnormal findings: Secondary | ICD-10-CM | POA: Diagnosis not present

## 2020-05-16 DIAGNOSIS — Z1389 Encounter for screening for other disorder: Secondary | ICD-10-CM | POA: Diagnosis not present

## 2020-05-16 DIAGNOSIS — J4540 Moderate persistent asthma, uncomplicated: Secondary | ICD-10-CM | POA: Diagnosis not present

## 2020-05-16 DIAGNOSIS — T461X5A Adverse effect of calcium-channel blockers, initial encounter: Secondary | ICD-10-CM | POA: Diagnosis not present

## 2020-05-22 ENCOUNTER — Ambulatory Visit (INDEPENDENT_AMBULATORY_CARE_PROVIDER_SITE_OTHER): Payer: Medicare Other | Admitting: General Surgery

## 2020-05-22 ENCOUNTER — Other Ambulatory Visit: Payer: Self-pay

## 2020-05-22 ENCOUNTER — Encounter: Payer: Self-pay | Admitting: General Surgery

## 2020-05-22 VITALS — BP 164/82 | HR 70 | Temp 98.1°F | Resp 18 | Ht 64.0 in | Wt 210.0 lb

## 2020-05-22 DIAGNOSIS — N6081 Other benign mammary dysplasias of right breast: Secondary | ICD-10-CM

## 2020-05-22 NOTE — Patient Instructions (Signed)
Epidermoid Cyst  An epidermoid cyst, also called an epidermal cyst, is a small lump under your skin. The cyst contains a substance called keratin. Do not try to pop or open the cyst yourself. What are the causes?  A blocked hair follicle.  A hair that curls and re-enters the skin instead of growing straight out of the skin.  A blocked pore.  Irritated skin.  An injury to the skin.  Certain conditions that are passed along from parent to child.  Human papillomavirus (HPV). This happens rarely when cysts occur on the bottom of the feet.  Long-term sun damage to the skin. What increases the risk?  Having acne.  Being female.  Having an injury to the skin.  Being past puberty.  Having certain conditions caused by genes (genetic disorder) What are the signs or symptoms? These cysts are usually harmless, but they can get infected. Symptoms of infection may include:  Redness.  Inflammation.  Tenderness.  Warmth.  Fever.  A bad-smelling substance that drains from the cyst.  Pus that drains from the cyst. How is this treated? In many cases, epidermoid cysts go away on their own without treatment. If a cyst becomes infected, treatment may include:  Opening and draining the cyst, done by a doctor. After draining, you may need minor surgery to remove the rest of the cyst.  Antibiotic medicine.  Shots of medicines (steroids) that help to reduce inflammation.  Surgery to remove the cyst. Surgery may be done if the cyst: ? Becomes large. ? Bothers you. ? Has a chance of turning into cancer.  Do not try to open a cyst yourself. Follow these instructions at home: Medicines  Take over-the-counter and prescription medicines as told by your doctor.  If you were prescribed an antibiotic medicine, take it as told by your doctor. Do not stop taking it even if you start to feel better. General instructions  Keep the area around your cyst clean and dry.  Wear loose, dry  clothing.  Avoid touching your cyst.  Check your cyst every day for signs of infection. Check for: ? Redness, swelling, or pain. ? Fluid or blood. ? Warmth. ? Pus or a bad smell.  Keep all follow-up visits. How is this prevented?  Wear clean, dry, clothing.  Avoid wearing tight clothing.  Keep your skin clean and dry. Take showers or baths every day. Contact a doctor if:  Your cyst has symptoms of infection.  Your condition does not improve or gets worse.  You have a cyst that looks different from other cysts you have had.  You have a fever. Get help right away if:  Redness spreads from the cyst into the area close by. Summary  An epidermoid cyst is a small lump under your skin.  If a cyst becomes infected, treatment may include surgery to open and drain the cyst, or to remove it.  Take over-the-counter and prescription medicines only as told by your doctor.  Contact a doctor if your condition is not improving or is getting worse.  Keep all follow-up visits. This information is not intended to replace advice given to you by your health care provider. Make sure you discuss any questions you have with your health care provider. Document Revised: 05/25/2019 Document Reviewed: 05/25/2019 Elsevier Patient Education  2021 [employer]  

## 2020-05-23 ENCOUNTER — Other Ambulatory Visit (HOSPITAL_COMMUNITY): Payer: Self-pay | Admitting: Obstetrics and Gynecology

## 2020-05-23 DIAGNOSIS — R928 Other abnormal and inconclusive findings on diagnostic imaging of breast: Secondary | ICD-10-CM

## 2020-05-23 NOTE — Progress Notes (Signed)
Holly Baldwin; [id number]; 05/20/1945   HPI Patient is a 75 year old black female who was referred to my care by Dr. Elonda Husky for evaluation and treatment of an abscess in the upper portion of the right breast.  Patient states is been present for several weeks.  She was started on antibiotic.  She has since finished that antibiotic.  She had a recent mammogram which was negative for cancer.  She denies any fever or chills.  She has noted some white material emanating from the abscess.  There is no family history of breast cancer. Past Medical History:  Diagnosis Date  . Anxiety   . Chronic abdominal pain   . Diabetes mellitus   . GERD (gastroesophageal reflux disease)   . Hiatal hernia   . Hypercholesteremia   . Hypertension     Past Surgical History:  Procedure Laterality Date  . ABDOMINAL HYSTERECTOMY    . CATARACT EXTRACTION W/PHACO Right 01/09/2020   Procedure: CATARACT EXTRACTION PHACO AND INTRAOCULAR LENS PLACEMENT (IOC);  Surgeon: Baruch Goldmann, MD;  Location: [facility];  Service: Ophthalmology;  Laterality: Right;  CDE: 12.01  . CATARACT EXTRACTION W/PHACO Left 01/23/2020   Procedure: CATARACT EXTRACTION PHACO AND INTRAOCULAR LENS PLACEMENT LEFT EYE;  Surgeon: Baruch Goldmann, MD;  Location: [facility];  Service: Ophthalmology;  Laterality: Left;  CDE 8.71  . COLONOSCOPY N/A 06/23/2012   Procedure: COLONOSCOPY;  Surgeon: Rogene Houston, MD;  Location: [facility];  Service: Endoscopy;  Laterality: N/A;  100-moved to 1200 Ann to notify pt  . ESOPHAGOGASTRODUODENOSCOPY N/A 05/06/2012   Procedure: ESOPHAGOGASTRODUODENOSCOPY (EGD);  Surgeon: Rogene Houston, MD;  Location: [facility];  Service: Endoscopy;  Laterality: N/A;  . MASS EXCISION Right 05/26/2012   Procedure: EXCISION NEOPLASM RIGHT THIGH;  Surgeon: Jamesetta So, MD;  Location: [facility];  Service: General;  Laterality: Right;    Family History  Problem Relation Age of Onset  . Diabetes Mother   . Diabetes Father   . Stroke Father    . Diabetes Sister   . Diabetes Brother   . Colon cancer Neg Hx   . Colon polyps Neg Hx     Current Outpatient Medications on File Prior to Visit  Medication Sig Dispense Refill  . albuterol (PROVENTIL HFA;VENTOLIN HFA) 108 (90 Base) MCG/ACT inhaler Inhale 1-2 puffs into the lungs every 6 (six) hours as needed for wheezing or shortness of breath.     Marland Kitchen aspirin EC 81 MG tablet Take 81 mg by mouth at bedtime.    . Blood Glucose Monitoring Suppl (ACCU-CHEK AVIVA PLUS) w/Device KIT     . carvedilol (COREG) 25 MG tablet     . clotrimazole-betamethasone (LOTRISONE) cream Apply topically 2 (two) times daily.    Marland Kitchen EPINEPHrine (EPI-PEN) 0.3 mg/0.3 mL DEVI Inject 0.3 mg into the muscle once.    . furosemide (LASIX) 20 MG tablet Take 20 mg by mouth 2 (two) times daily.    Marland Kitchen gabapentin (NEURONTIN) 100 MG capsule TAKE 1 CAPSULE TWICE DAILY 180 capsule 0  . glipiZIDE (GLUCOTROL) 5 MG tablet Take by mouth daily before breakfast.    . glucose blood (ACCU-CHEK AVIVA PLUS) test strip TEST BLOOD GLUCOSE FOUR TIMES DAILY 400 strip 2  . hydrALAZINE (APRESOLINE) 100 MG tablet Take 1 tablet (100 mg total) by mouth 3 (three) times daily. 90 tablet 3  . insulin glargine (LANTUS SOLOSTAR) 100 UNIT/ML Solostar Pen Inject 40 Units into the skin at bedtime. 45 mL 1  . insulin lispro (  HUMALOG) 100 UNIT/ML KwikPen Inject 10 Units into the skin 3 (three) times daily. With meals 15 mL 1  . isosorbide mononitrate (IMDUR) 60 MG 24 hr tablet Take 1 tablet (60 mg total) by mouth daily. 30 tablet 3  . levothyroxine (SYNTHROID) 137 MCG tablet TAKE 1 TABLET (137 MCG TOTAL) BY MOUTH DAILY BEFORE BREAKFAST. 90 tablet 0  . lisinopril (ZESTRIL) 5 MG tablet Take 5 mg by mouth daily.    Marland Kitchen moxifloxacin (VIGAMOX) 0.5 % ophthalmic solution Apply to eye.    . Multiple Vitamin (MULTIVITAMIN WITH MINERALS) TABS tablet Take 1 tablet by mouth daily.    Marland Kitchen NIFEdipine (PROCARDIA XL/NIFEDICAL XL) 60 MG 24 hr tablet Take 60 mg by mouth daily.     . silver sulfADIAZINE (SILVADENE) 1 % cream Apply to area on the breast  3 times daily 50 g 11  . simvastatin (ZOCOR) 40 MG tablet TAKE 1 TABLET EVERY DAY (Patient taking differently: Take 40 mg by mouth daily.) 90 tablet 1  . sulfamethoxazole-trimethoprim (BACTRIM DS) 800-160 MG tablet Take 1 tablet by mouth 2 (two) times daily. 14 tablet 0  . ziprasidone (GEODON) 80 MG capsule Take 80 mg by mouth 2 (two) times daily with a meal.     No current facility-administered medications on file prior to visit.    Allergies  Allergen Reactions  . Bee Venom Anaphylaxis  . Ace Inhibitors   . Penicillins Hives    .Has patient had a PCN reaction causing immediate rash, facial/tongue/throat swelling, SOB or lightheadedness with hypotension: Yes Has patient had a PCN reaction causing severe rash involving mucus membranes or skin necrosis: No Has patient had a PCN reaction that required hospitalization: Yes Has patient had a PCN reaction occurring within the last 10 years: No If all of the above answers are "NO", then may proceed with Cephalosporin use.     Social History   Substance and Sexual Activity  Alcohol Use No    Social History   Tobacco Use  Smoking Status Never Smoker  Smokeless Tobacco Never Used    Review of Systems  Constitutional: Negative.   HENT: Negative.   Eyes: Negative.   Respiratory: Negative.   Cardiovascular: Negative.   Gastrointestinal: Negative.   Genitourinary: Negative.   Musculoskeletal: Negative.   Skin: Negative.   Neurological: Negative.   Endo/Heme/Allergies: Negative.   Psychiatric/Behavioral: Negative.     Objective   Vitals:   05/22/20 1414  BP: (!) 164/82  Pulse: 70  Resp: 18  Temp: 98.1 F (36.7 C)  SpO2: 97%    Physical Exam Vitals reviewed.  Constitutional:      Appearance: Normal appearance. She is obese. She is not ill-appearing.  HENT:     Head: Normocephalic and atraumatic.  Cardiovascular:     Rate and Rhythm: Normal  rate and regular rhythm.     Heart sounds: Normal heart sounds. No murmur heard. No friction rub. No gallop.   Pulmonary:     Effort: Pulmonary effort is normal. No respiratory distress.     Breath sounds: Normal breath sounds. No stridor. No wheezing, rhonchi or rales.  Skin:    General: Skin is warm and dry.  Neurological:     Mental Status: She is alert and oriented to person, place, and time.   Right breast: Sebaceous cyst noted at 12 o'clock position.  I was able to express a significant amount of sebum.  No purulent drainage is noted.  Minimal erythema is noted.  No dominant mass  in the right breast.  The axilla is negative for palpable nodes.  Mammogram was negative for malignancy.  Assessment  Sebaceous cyst, right breast, resolving Plan   Patient was told to continue trying to express the sebum as much as possible.  No need for antibiotic at this time.  Follow-up in my office and 1 week.

## 2020-05-29 ENCOUNTER — Ambulatory Visit (HOSPITAL_COMMUNITY)
Admission: RE | Admit: 2020-05-29 | Discharge: 2020-05-29 | Disposition: A | Discharge status: Home / Self Care | Payer: Medicare Other | Source: Ambulatory Visit | Attending: Physician Assistant | Admitting: Physician Assistant

## 2020-05-29 ENCOUNTER — Ambulatory Visit: Payer: Medicare Other | Admitting: General Surgery

## 2020-05-29 DIAGNOSIS — R928 Other abnormal and inconclusive findings on diagnostic imaging of breast: Secondary | ICD-10-CM | POA: Diagnosis not present

## 2020-05-29 DIAGNOSIS — R922 Inconclusive mammogram: Secondary | ICD-10-CM | POA: Diagnosis not present

## 2020-05-30 DIAGNOSIS — I10 Essential (primary) hypertension: Secondary | ICD-10-CM | POA: Diagnosis not present

## 2020-06-05 ENCOUNTER — Ambulatory Visit: Payer: Medicare Other | Admitting: General Surgery

## 2020-06-05 DIAGNOSIS — E1165 Type 2 diabetes mellitus with hyperglycemia: Secondary | ICD-10-CM | POA: Diagnosis not present

## 2020-06-19 DIAGNOSIS — E1122 Type 2 diabetes mellitus with diabetic chronic kidney disease: Secondary | ICD-10-CM | POA: Diagnosis not present

## 2020-06-19 DIAGNOSIS — R809 Proteinuria, unspecified: Secondary | ICD-10-CM | POA: Diagnosis not present

## 2020-06-19 DIAGNOSIS — N189 Chronic kidney disease, unspecified: Secondary | ICD-10-CM | POA: Diagnosis not present

## 2020-06-19 DIAGNOSIS — E211 Secondary hyperparathyroidism, not elsewhere classified: Secondary | ICD-10-CM | POA: Diagnosis not present

## 2020-06-19 DIAGNOSIS — I5032 Chronic diastolic (congestive) heart failure: Secondary | ICD-10-CM | POA: Diagnosis not present

## 2020-06-21 DIAGNOSIS — I129 Hypertensive chronic kidney disease with stage 1 through stage 4 chronic kidney disease, or unspecified chronic kidney disease: Secondary | ICD-10-CM | POA: Diagnosis not present

## 2020-06-21 DIAGNOSIS — R809 Proteinuria, unspecified: Secondary | ICD-10-CM | POA: Diagnosis not present

## 2020-06-21 DIAGNOSIS — N189 Chronic kidney disease, unspecified: Secondary | ICD-10-CM | POA: Diagnosis not present

## 2020-06-21 DIAGNOSIS — I5032 Chronic diastolic (congestive) heart failure: Secondary | ICD-10-CM | POA: Diagnosis not present

## 2020-06-21 DIAGNOSIS — D631 Anemia in chronic kidney disease: Secondary | ICD-10-CM | POA: Diagnosis not present

## 2020-06-21 DIAGNOSIS — E1122 Type 2 diabetes mellitus with diabetic chronic kidney disease: Secondary | ICD-10-CM | POA: Diagnosis not present

## 2020-06-21 DIAGNOSIS — E1129 Type 2 diabetes mellitus with other diabetic kidney complication: Secondary | ICD-10-CM | POA: Diagnosis not present

## 2020-06-21 DIAGNOSIS — E211 Secondary hyperparathyroidism, not elsewhere classified: Secondary | ICD-10-CM | POA: Diagnosis not present

## 2020-06-21 DIAGNOSIS — R600 Localized edema: Secondary | ICD-10-CM | POA: Diagnosis not present

## 2020-06-22 ENCOUNTER — Other Ambulatory Visit (HOSPITAL_COMMUNITY): Payer: Self-pay | Admitting: Nephrology

## 2020-06-22 ENCOUNTER — Other Ambulatory Visit: Payer: Self-pay | Admitting: Nephrology

## 2020-06-22 DIAGNOSIS — N184 Chronic kidney disease, stage 4 (severe): Secondary | ICD-10-CM | POA: Diagnosis not present

## 2020-06-22 DIAGNOSIS — R600 Localized edema: Secondary | ICD-10-CM

## 2020-06-22 DIAGNOSIS — L03119 Cellulitis of unspecified part of limb: Secondary | ICD-10-CM | POA: Diagnosis not present

## 2020-06-22 DIAGNOSIS — E039 Hypothyroidism, unspecified: Secondary | ICD-10-CM | POA: Diagnosis not present

## 2020-06-25 ENCOUNTER — Other Ambulatory Visit: Payer: Self-pay

## 2020-06-25 ENCOUNTER — Ambulatory Visit (INDEPENDENT_AMBULATORY_CARE_PROVIDER_SITE_OTHER): Payer: Medicare Other | Admitting: "Endocrinology

## 2020-06-25 DIAGNOSIS — Z794 Long term (current) use of insulin: Secondary | ICD-10-CM

## 2020-06-25 DIAGNOSIS — E118 Type 2 diabetes mellitus with unspecified complications: Secondary | ICD-10-CM

## 2020-06-25 DIAGNOSIS — IMO0002 Reserved for concepts with insufficient information to code with codable children: Secondary | ICD-10-CM

## 2020-06-25 DIAGNOSIS — E1165 Type 2 diabetes mellitus with hyperglycemia: Secondary | ICD-10-CM

## 2020-06-25 NOTE — Progress Notes (Signed)
Patient and spouse here for [facility] @ set up assistance, Patient was instructed and demonstrated setting up of [employer] device and sensor, all questions answered, patient will call if any further concerns or questions.

## 2020-07-02 ENCOUNTER — Ambulatory Visit (HOSPITAL_COMMUNITY)
Admission: RE | Admit: 2020-07-02 | Discharge: 2020-07-02 | Disposition: A | Discharge status: Home / Self Care | Payer: Medicare Other | Source: Ambulatory Visit | Attending: Nephrology | Admitting: Nephrology

## 2020-07-02 ENCOUNTER — Other Ambulatory Visit: Payer: Self-pay

## 2020-07-02 DIAGNOSIS — I808 Phlebitis and thrombophlebitis of other sites: Secondary | ICD-10-CM | POA: Diagnosis not present

## 2020-07-02 DIAGNOSIS — S8012XA Contusion of left lower leg, initial encounter: Secondary | ICD-10-CM | POA: Diagnosis not present

## 2020-07-02 DIAGNOSIS — I8002 Phlebitis and thrombophlebitis of superficial vessels of left lower extremity: Secondary | ICD-10-CM | POA: Diagnosis not present

## 2020-07-02 DIAGNOSIS — M7989 Other specified soft tissue disorders: Secondary | ICD-10-CM | POA: Diagnosis not present

## 2020-07-02 DIAGNOSIS — R600 Localized edema: Secondary | ICD-10-CM | POA: Insufficient documentation

## 2020-07-06 DIAGNOSIS — E1165 Type 2 diabetes mellitus with hyperglycemia: Secondary | ICD-10-CM | POA: Diagnosis not present

## 2020-07-17 ENCOUNTER — Ambulatory Visit: Payer: Medicare Other | Admitting: General Surgery

## 2020-07-17 ENCOUNTER — Ambulatory Visit (INDEPENDENT_AMBULATORY_CARE_PROVIDER_SITE_OTHER): Payer: Medicare Other | Admitting: General Surgery

## 2020-07-17 ENCOUNTER — Other Ambulatory Visit: Payer: Self-pay

## 2020-07-17 ENCOUNTER — Encounter: Payer: Self-pay | Admitting: General Surgery

## 2020-07-17 VITALS — BP 158/74 | HR 69 | Temp 98.4°F | Resp 14 | Ht 64.0 in | Wt 211.0 lb

## 2020-07-17 DIAGNOSIS — L723 Sebaceous cyst: Secondary | ICD-10-CM | POA: Diagnosis not present

## 2020-07-17 NOTE — Progress Notes (Signed)
Subjective:     Holly Baldwin  Here for follow-up on sebaceous cyst of chest along the superior portion of the right breast.  She has been able to express some white fluid.  She denies any fevers. Objective:    BP (!) 158/74   Pulse 69   Temp 98.4 F (36.9 C) (Other (Comment))   Resp 14   Ht 5\' 4"  (1.626 m)   Wt 211 lb (95.7 kg)   SpO2 95%   BMI 36.22 kg/m   General:  alert, cooperative and no distress  Right breast with 1.5 cm sebaceous cyst with 3 punctum's present.  I was able to express a significant amount of sebum.  This resulted in collapse of the cyst.  No purulent drainage noted.    Assessment:    Sebaceous cyst, right breast, resolving    Plan:   Patient was told to express the cyst when it starts to swell.  She was instructed to follow-up with my care should it significantly enlarge or she desires excision.  Follow-up as needed.

## 2020-07-31 DIAGNOSIS — I10 Essential (primary) hypertension: Secondary | ICD-10-CM | POA: Diagnosis not present

## 2020-08-06 DIAGNOSIS — E1165 Type 2 diabetes mellitus with hyperglycemia: Secondary | ICD-10-CM | POA: Diagnosis not present

## 2020-08-24 DIAGNOSIS — E211 Secondary hyperparathyroidism, not elsewhere classified: Secondary | ICD-10-CM | POA: Diagnosis not present

## 2020-08-24 DIAGNOSIS — R809 Proteinuria, unspecified: Secondary | ICD-10-CM | POA: Diagnosis not present

## 2020-08-24 DIAGNOSIS — N189 Chronic kidney disease, unspecified: Secondary | ICD-10-CM | POA: Diagnosis not present

## 2020-08-24 DIAGNOSIS — G4700 Insomnia, unspecified: Secondary | ICD-10-CM | POA: Diagnosis not present

## 2020-08-24 DIAGNOSIS — E1122 Type 2 diabetes mellitus with diabetic chronic kidney disease: Secondary | ICD-10-CM | POA: Diagnosis not present

## 2020-08-24 DIAGNOSIS — I5032 Chronic diastolic (congestive) heart failure: Secondary | ICD-10-CM | POA: Diagnosis not present

## 2020-08-24 DIAGNOSIS — R030 Elevated blood-pressure reading, without diagnosis of hypertension: Secondary | ICD-10-CM | POA: Diagnosis not present

## 2020-08-30 DIAGNOSIS — I10 Essential (primary) hypertension: Secondary | ICD-10-CM | POA: Diagnosis not present

## 2020-08-30 DIAGNOSIS — I5032 Chronic diastolic (congestive) heart failure: Secondary | ICD-10-CM | POA: Diagnosis not present

## 2020-08-30 DIAGNOSIS — E1122 Type 2 diabetes mellitus with diabetic chronic kidney disease: Secondary | ICD-10-CM | POA: Diagnosis not present

## 2020-08-30 DIAGNOSIS — R809 Proteinuria, unspecified: Secondary | ICD-10-CM | POA: Diagnosis not present

## 2020-08-30 DIAGNOSIS — N189 Chronic kidney disease, unspecified: Secondary | ICD-10-CM | POA: Diagnosis not present

## 2020-08-30 DIAGNOSIS — E1129 Type 2 diabetes mellitus with other diabetic kidney complication: Secondary | ICD-10-CM | POA: Diagnosis not present

## 2020-08-30 DIAGNOSIS — R808 Other proteinuria: Secondary | ICD-10-CM | POA: Diagnosis not present

## 2020-08-30 DIAGNOSIS — E211 Secondary hyperparathyroidism, not elsewhere classified: Secondary | ICD-10-CM | POA: Diagnosis not present

## 2020-08-30 DIAGNOSIS — D631 Anemia in chronic kidney disease: Secondary | ICD-10-CM | POA: Diagnosis not present

## 2020-09-06 DIAGNOSIS — E1165 Type 2 diabetes mellitus with hyperglycemia: Secondary | ICD-10-CM | POA: Diagnosis not present

## 2020-09-07 ENCOUNTER — Other Ambulatory Visit: Payer: Self-pay | Admitting: "Endocrinology

## 2020-09-30 DIAGNOSIS — I10 Essential (primary) hypertension: Secondary | ICD-10-CM | POA: Diagnosis not present

## 2020-09-30 DIAGNOSIS — E1129 Type 2 diabetes mellitus with other diabetic kidney complication: Secondary | ICD-10-CM | POA: Diagnosis not present

## 2020-10-01 DIAGNOSIS — I10 Essential (primary) hypertension: Secondary | ICD-10-CM | POA: Diagnosis not present

## 2020-10-07 DIAGNOSIS — E1165 Type 2 diabetes mellitus with hyperglycemia: Secondary | ICD-10-CM | POA: Diagnosis not present

## 2020-10-10 DIAGNOSIS — E1129 Type 2 diabetes mellitus with other diabetic kidney complication: Secondary | ICD-10-CM | POA: Diagnosis not present

## 2020-10-10 DIAGNOSIS — E211 Secondary hyperparathyroidism, not elsewhere classified: Secondary | ICD-10-CM | POA: Diagnosis not present

## 2020-10-10 DIAGNOSIS — R809 Proteinuria, unspecified: Secondary | ICD-10-CM | POA: Diagnosis not present

## 2020-10-10 DIAGNOSIS — N189 Chronic kidney disease, unspecified: Secondary | ICD-10-CM | POA: Diagnosis not present

## 2020-10-10 DIAGNOSIS — E1122 Type 2 diabetes mellitus with diabetic chronic kidney disease: Secondary | ICD-10-CM | POA: Diagnosis not present

## 2020-10-12 DIAGNOSIS — I5032 Chronic diastolic (congestive) heart failure: Secondary | ICD-10-CM | POA: Diagnosis not present

## 2020-10-12 DIAGNOSIS — E1129 Type 2 diabetes mellitus with other diabetic kidney complication: Secondary | ICD-10-CM | POA: Diagnosis not present

## 2020-10-12 DIAGNOSIS — E211 Secondary hyperparathyroidism, not elsewhere classified: Secondary | ICD-10-CM | POA: Diagnosis not present

## 2020-10-12 DIAGNOSIS — R808 Other proteinuria: Secondary | ICD-10-CM | POA: Diagnosis not present

## 2020-10-12 DIAGNOSIS — E1122 Type 2 diabetes mellitus with diabetic chronic kidney disease: Secondary | ICD-10-CM | POA: Diagnosis not present

## 2020-10-12 DIAGNOSIS — D631 Anemia in chronic kidney disease: Secondary | ICD-10-CM | POA: Diagnosis not present

## 2020-10-12 DIAGNOSIS — N189 Chronic kidney disease, unspecified: Secondary | ICD-10-CM | POA: Diagnosis not present

## 2020-10-12 DIAGNOSIS — R809 Proteinuria, unspecified: Secondary | ICD-10-CM | POA: Diagnosis not present

## 2020-10-16 ENCOUNTER — Other Ambulatory Visit: Payer: Self-pay

## 2020-10-16 ENCOUNTER — Ambulatory Visit: Payer: Medicare Other

## 2020-11-07 DIAGNOSIS — E1129 Type 2 diabetes mellitus with other diabetic kidney complication: Secondary | ICD-10-CM | POA: Diagnosis not present

## 2020-11-07 DIAGNOSIS — E1165 Type 2 diabetes mellitus with hyperglycemia: Secondary | ICD-10-CM | POA: Diagnosis not present

## 2020-11-07 DIAGNOSIS — E211 Secondary hyperparathyroidism, not elsewhere classified: Secondary | ICD-10-CM | POA: Diagnosis not present

## 2020-11-07 DIAGNOSIS — E1122 Type 2 diabetes mellitus with diabetic chronic kidney disease: Secondary | ICD-10-CM | POA: Diagnosis not present

## 2020-11-07 DIAGNOSIS — E039 Hypothyroidism, unspecified: Secondary | ICD-10-CM | POA: Diagnosis not present

## 2020-11-07 DIAGNOSIS — N189 Chronic kidney disease, unspecified: Secondary | ICD-10-CM | POA: Diagnosis not present

## 2020-11-07 DIAGNOSIS — R809 Proteinuria, unspecified: Secondary | ICD-10-CM | POA: Diagnosis not present

## 2020-11-08 LAB — TSH: TSH: 0.892 u[IU]/mL (ref 0.450–4.500)

## 2020-11-08 LAB — T4, FREE: Free T4: 1.38 ng/dL (ref 0.82–1.77)

## 2020-11-12 ENCOUNTER — Ambulatory Visit (INDEPENDENT_AMBULATORY_CARE_PROVIDER_SITE_OTHER): Payer: Medicare Other | Admitting: Nurse Practitioner

## 2020-11-12 ENCOUNTER — Encounter: Payer: Self-pay | Admitting: Nurse Practitioner

## 2020-11-12 ENCOUNTER — Other Ambulatory Visit: Payer: Self-pay

## 2020-11-12 VITALS — BP 115/71 | HR 70 | Ht 64.0 in | Wt 205.4 lb

## 2020-11-12 DIAGNOSIS — Z794 Long term (current) use of insulin: Secondary | ICD-10-CM

## 2020-11-12 DIAGNOSIS — E039 Hypothyroidism, unspecified: Secondary | ICD-10-CM

## 2020-11-12 DIAGNOSIS — E118 Type 2 diabetes mellitus with unspecified complications: Secondary | ICD-10-CM | POA: Diagnosis not present

## 2020-11-12 DIAGNOSIS — E782 Mixed hyperlipidemia: Secondary | ICD-10-CM

## 2020-11-12 DIAGNOSIS — E1165 Type 2 diabetes mellitus with hyperglycemia: Secondary | ICD-10-CM | POA: Diagnosis not present

## 2020-11-12 DIAGNOSIS — I10 Essential (primary) hypertension: Secondary | ICD-10-CM

## 2020-11-12 DIAGNOSIS — IMO0002 Reserved for concepts with insufficient information to code with codable children: Secondary | ICD-10-CM

## 2020-11-12 LAB — POCT GLYCOSYLATED HEMOGLOBIN (HGB A1C): Hemoglobin A1C: 6.7 % — AB (ref 4.0–5.6)

## 2020-11-12 NOTE — Patient Instructions (Signed)

## 2020-11-12 NOTE — Progress Notes (Signed)
11/12/2020      Endocrinology follow-up note   Subjective:    Patient ID: Holly Baldwin, female    DOB: 1945-11-09,    Past Medical History:  Diagnosis Date   Anxiety    Chronic abdominal pain    Diabetes mellitus    GERD (gastroesophageal reflux disease)    Hiatal hernia    Hypercholesteremia    Hypertension    Past Surgical History:  Procedure Laterality Date   ABDOMINAL HYSTERECTOMY     CATARACT EXTRACTION W/PHACO Right 01/09/2020   Procedure: CATARACT EXTRACTION PHACO AND INTRAOCULAR LENS PLACEMENT ([place]);  Surgeon: Baruch Goldmann, MD;  Location: [facility];  Service: Ophthalmology;  Laterality: Right;  CDE: 12.01   CATARACT EXTRACTION W/PHACO Left 01/23/2020   Procedure: CATARACT EXTRACTION PHACO AND INTRAOCULAR LENS PLACEMENT LEFT EYE;  Surgeon: Baruch Goldmann, MD;  Location: [facility];  Service: Ophthalmology;  Laterality: Left;  CDE 8.71   COLONOSCOPY N/A 06/23/2012   Procedure: COLONOSCOPY;  Surgeon: Rogene Houston, MD;  Location: [facility];  Service: Endoscopy;  Laterality: N/A;  100-moved to 1200 Ann to notify pt   ESOPHAGOGASTRODUODENOSCOPY N/A 05/06/2012   Procedure: ESOPHAGOGASTRODUODENOSCOPY (EGD);  Surgeon: Rogene Houston, MD;  Location: [facility];  Service: Endoscopy;  Laterality: N/A;   MASS EXCISION Right 05/26/2012   Procedure: EXCISION NEOPLASM RIGHT THIGH;  Surgeon: Jamesetta So, MD;  Location: [facility];  Service: General;  Laterality: Right;   Social History   Socioeconomic History   Marital status: Married    Spouse name: Not on file   Number of children: Not on file   Years of education: Not on file   Highest education level: Not on file  Occupational History   Not on file  Tobacco Use   Smoking status: Never   Smokeless tobacco: Never  Vaping Use   Vaping Use: Never used  Substance and Sexual Activity   Alcohol use: No   Drug use: No   Sexual activity: Not Currently    Birth control/protection: Post-menopausal  Other Topics Concern   Not  on file  Social History Narrative   Not on file   Social Determinants of Health   Financial Resource Strain: High Risk   Difficulty of Paying Living Expenses: Very hard  Food Insecurity: No Food Insecurity   Worried About Running Out of Food in the Last Year: Never true   Ran Out of Food in the Last Year: Never true  Transportation Needs: No Transportation Needs   Lack of Transportation (Medical): No   Lack of Transportation (Non-Medical): No  Physical Activity: Inactive   Days of Exercise per Week: 0 days   Minutes of Exercise per Session: 0 min  Stress: No Stress Concern Present   Feeling of Stress : Not at all  Social Connections: Socially Isolated   Frequency of Communication with Friends and Family: Once a week   Frequency of Social Gatherings with Friends and Family: Never   Attends Religious Services: Never   Marine scientist or Organizations: No   Attends Archivist Meetings: Never   Marital Status: Married   Outpatient Encounter Medications as of 11/12/2020  Medication Sig   albuterol (PROVENTIL HFA;VENTOLIN HFA) 108 (90 Base) MCG/ACT inhaler Inhale 1-2 puffs into the lungs every 6 (six) hours as needed for wheezing or shortness of breath.    aspirin EC 81 MG tablet Take 81 mg by mouth at bedtime.   benzonatate (TESSALON) 200 MG capsule Take  200 mg by mouth 3 (three) times daily as needed.   Blood Glucose Monitoring Suppl (ACCU-CHEK AVIVA PLUS) w/Device KIT    carvedilol (COREG) 25 MG tablet    clotrimazole-betamethasone (LOTRISONE) cream Apply topically 2 (two) times daily.   EPINEPHrine (EPI-PEN) 0.3 mg/0.3 mL DEVI Inject 0.3 mg into the muscle once.   furosemide (LASIX) 40 MG tablet Take 40 mg by mouth 2 (two) times daily.   gabapentin (NEURONTIN) 100 MG capsule TAKE 1 CAPSULE TWICE DAILY   glipiZIDE (GLUCOTROL) 5 MG tablet Take by mouth daily before breakfast.   glucose blood (ACCU-CHEK AVIVA PLUS) test strip TEST BLOOD GLUCOSE FOUR TIMES DAILY    hydrALAZINE (APRESOLINE) 100 MG tablet Take 1 tablet (100 mg total) by mouth 3 (three) times daily.   insulin lispro (HUMALOG) 100 UNIT/ML KwikPen Inject 10 Units into the skin 3 (three) times daily. With meals   isosorbide mononitrate (IMDUR) 60 MG 24 hr tablet Take 1 tablet (60 mg total) by mouth daily.   LANTUS SOLOSTAR 100 UNIT/ML Solostar Pen INJECT 40 UNITS SUBCUTANEOUSLY AT 10PM.   levothyroxine (SYNTHROID) 137 MCG tablet TAKE 1 TABLET (137 MCG TOTAL) BY MOUTH DAILY BEFORE BREAKFAST.   lisinopril (ZESTRIL) 5 MG tablet Take 5 mg by mouth daily.   moxifloxacin (VIGAMOX) 0.5 % ophthalmic solution Apply to eye.   Multiple Vitamin (MULTIVITAMIN WITH MINERALS) TABS tablet Take 1 tablet by mouth daily.   NIFEdipine (PROCARDIA XL/NIFEDICAL XL) 60 MG 24 hr tablet Take 60 mg by mouth daily.   silver sulfADIAZINE (SILVADENE) 1 % cream Apply to area on the breast  3 times daily   simvastatin (ZOCOR) 40 MG tablet TAKE 1 TABLET EVERY DAY (Patient taking differently: Take 40 mg by mouth daily.)   sulfamethoxazole-trimethoprim (BACTRIM DS) 800-160 MG tablet Take 1 tablet by mouth 2 (two) times daily.   ziprasidone (GEODON) 40 MG capsule Take by mouth daily as needed.   [DISCONTINUED] furosemide (LASIX) 20 MG tablet Take 20 mg by mouth 2 (two) times daily. (Patient not taking: Reported on 11/12/2020)   [DISCONTINUED] NIFEdipine (PROCARDIA XL/NIFEDICAL-XL) 90 MG 24 hr tablet Take 90 mg by mouth daily. (Patient not taking: Reported on 11/12/2020)   [DISCONTINUED] ziprasidone (GEODON) 80 MG capsule Take 80 mg by mouth 2 (two) times daily with a meal. (Patient not taking: Reported on 11/12/2020)   No facility-administered encounter medications on file as of 11/12/2020.   ALLERGIES: Allergies  Allergen Reactions   Bee Venom Anaphylaxis   Ace Inhibitors    Penicillin G    Penicillins Hives    .Has patient had a PCN reaction causing immediate rash, facial/tongue/throat swelling, SOB or lightheadedness with  hypotension: Yes Has patient had a PCN reaction causing severe rash involving mucus membranes or skin necrosis: No Has patient had a PCN reaction that required hospitalization: Yes Has patient had a PCN reaction occurring within the last 10 years: No If all of the above answers are "NO", then may proceed with Cephalosporin use.    VACCINATION STATUS:  There is no immunization history on file for this patient.  Diabetes She presents for her follow-up diabetic visit. She has type 2 diabetes mellitus. Onset time: she was diagnosed at approximate age of 19 years. Her disease course has been improving. There are no hypoglycemic associated symptoms. Pertinent negatives for hypoglycemia include no confusion, headaches, nervousness/anxiousness, pallor, seizures or tremors. Pertinent negatives for diabetes include no chest pain, no fatigue, no polydipsia, no polyphagia, no polyuria and no weight loss. There are  no hypoglycemic complications. Symptoms are stable. Diabetic complications include nephropathy. Risk factors for coronary artery disease include diabetes mellitus, dyslipidemia, obesity, sedentary lifestyle and hypertension. Current diabetic treatment includes intensive insulin program. She is compliant with treatment most of the time. Her weight is decreasing steadily. She is following a generally healthy diet. When asked about meal planning, she reported none. She has not had a previous visit with a dietitian. She never participates in exercise. Her home blood glucose trend is decreasing steadily. Her overall blood glucose range is 130-140 mg/dl. (She presents today with her CGM, no logs, showing improved glycemic profile overall.  Her POCT A1c today is 6.7%, improving from last visit of 6.9%.  Analysis of her CGM shows TIR 86%, TAR 13%, TBR 1%.  She denies any significant hypoglycemia.) An ACE inhibitor/angiotensin II receptor blocker is being taken. She does not see a podiatrist.Eye exam is current.   Thyroid Problem Presents for follow-up (She is currently on levothyroxine 137 mcg p.o. nightly.  She reports compliance with medication.) visit. Patient reports no anxiety, cold intolerance, constipation, depressed mood, diarrhea, fatigue, heat intolerance, palpitations, tremors, weight gain or weight loss. The symptoms have been stable. Past treatments include levothyroxine. Her past medical history is significant for diabetes and hyperlipidemia.  Hyperlipidemia This is a chronic problem. The current episode started more than 1 year ago. The problem is uncontrolled. Recent lipid tests were reviewed and are high. Exacerbating diseases include chronic renal disease, diabetes and obesity. Factors aggravating her hyperlipidemia include beta blockers and fatty foods. Pertinent negatives include no chest pain, myalgias or shortness of breath. Current antihyperlipidemic treatment includes statins. The current treatment provides mild improvement of lipids. Compliance problems include adherence to diet and adherence to exercise.  Risk factors for coronary artery disease include dyslipidemia, diabetes mellitus, hypertension, obesity, a sedentary lifestyle and post-menopausal.  Hypertension This is a chronic problem. The current episode started more than 1 year ago. The problem has been gradually improving since onset. The problem is controlled. Pertinent negatives include no chest pain, headaches, palpitations or shortness of breath. Agents associated with hypertension include thyroid hormones. Risk factors for coronary artery disease include dyslipidemia, diabetes mellitus, obesity, post-menopausal state and sedentary lifestyle. Past treatments include calcium channel blockers, central alpha agonists and diuretics. The current treatment provides mild improvement. Compliance problems include exercise and diet.  Hypertensive end-organ damage includes kidney disease. Identifiable causes of hypertension include chronic  renal disease and a thyroid problem.    Review of systems  Constitutional: + steadily decreasing body weight,  current Body mass index is 35.26 kg/m. , no fatigue, no subjective hyperthermia, no subjective hypothermia Eyes: no blurry vision, no xerophthalmia ENT: no sore throat, no nodules palpated in throat, no dysphagia/odynophagia, no hoarseness Cardiovascular: no chest pain, no shortness of breath, no palpitations, no leg swelling Respiratory: no cough, no shortness of breath Gastrointestinal: no nausea/vomiting/diarrhea Musculoskeletal: no muscle/joint aches Skin: no rashes, no hyperemia Neurological: no tremors, no numbness, no tingling, no dizziness Psychiatric: no depression, no anxiety   Objective:    BP 115/71   Pulse 70   Ht _0  (1.626 m)   Wt 205 lb 6.4 oz (93.2 kg)   BMI 35.26 kg/m   Wt Readings from Last 3 Encounters:  11/12/20 205 lb 6.4 oz (93.2 kg)  07/17/20 211 lb (95.7 kg)  05/22/20 210 lb (95.3 kg)    BP Readings from Last 3 Encounters:  11/12/20 115/71  07/17/20 (!) 158/74  05/22/20 (!) 164/82  Physical Exam- Limited  Constitutional:  Body mass index is 35.26 kg/m. , not in acute distress, normal state of mind Eyes:  EOMI, no exophthalmos Neck: Supple Cardiovascular: RRR, no murmurs, rubs, or gallops, no edema Respiratory: Adequate breathing efforts, no crackles, rales, rhonchi, or wheezing Musculoskeletal: no gross deformities, strength intact in all four extremities, no gross restriction of joint movements Skin:  no rashes, no hyperemia Neurological: no tremor with outstretched hands    Results for orders placed or performed in visit on 11/12/20  POCT glycosylated hemoglobin (Hb A1C)  Result Value Ref Range   Hemoglobin A1C 6.7 (A) 4.0 - 5.6 %   HbA1c POC (<> result, manual entry)     HbA1c, POC (prediabetic range)     HbA1c, POC (controlled diabetic range)     Diabetic Labs (most recent): Lab Results  Component Value Date    HGBA1C 6.7 (A) 11/12/2020   HGBA1C 6.9 05/11/2020   HGBA1C 6.4 (A) 12/12/2019   Lipid Panel     Component Value Date/Time   CHOL 184 05/09/2020 0932   TRIG 197 (H) 05/09/2020 0932   HDL 42 05/09/2020 0932   CHOLHDL 4.4 05/09/2020 0932   CHOLHDL 2.3 08/21/2018 0450   VLDL 21 08/21/2018 0450   LDLCALC 108 (H) 05/09/2020 0932   LDLCALC 105 (H) 12/14/2017 0921     Assessment & Plan:   1) Uncontrolled type 2 diabetes mellitus with complication  Her diabetes is  complicated by CAD,  neuropathy , microalbuminuria, worsening CKD,  and patient remains at a high risk for more acute and chronic complications of diabetes which include CAD, CVA, CKD, retinopathy, and neuropathy. These are all discussed in detail with the patient.  She presents today with her CGM, no logs, showing improved glycemic profile overall.  Her POCT A1c today is 6.7%, improving from last visit of 6.9%.  Analysis of her CGM shows TIR 86%, TAR 13%, TBR 1%.  She denies any significant hypoglycemia.  - Nutritional counseling repeated at each appointment due to patients tendency to fall back in to old habits.  - The patient admits there is a room for improvement in their diet and drink choices. -  Suggestion is made for the patient to avoid simple carbohydrates from their diet including Cakes, Sweet Desserts / Pastries, Ice Cream, Soda (diet and regular), Sweet Tea, Candies, Chips, Cookies, Sweet Pastries, Store Bought Juices, Alcohol in Excess of 1-2 drinks a day, Artificial Sweeteners, Coffee Creamer, and "Sugar-free" Products. This will help patient to have stable blood glucose profile and potentially avoid unintended weight gain.   - I encouraged the patient to switch to unprocessed or minimally processed complex starch and increased protein intake (animal or plant source), fruits, and vegetables.   - Patient is advised to stick to a routine mealtimes to eat 3 meals a day and avoid unnecessary snacks (to snack only to  correct hypoglycemia).  - I have approached patient with the following individualized plan to manage diabetes and patient agrees.  She will continue to require intensive treatment with basal/bolus insulin in order for her to achieve and maintain control of diabetes to target.    -Given her stable, at goal, glycemic profile, she is advised to continue the same medication regimen.  She is advised to continue Lantus 40 units SQ daily at bedtime, Novolog 10-16 units TID with meals if glucose over 90 and she is eating.  Specific instructions on how to titrate insulin dose based on glucose readings given to patient  in writing.  -she is encouraged to continue monitoring blood glucose (using her CGM) at least 4 times daily, before meals and at bedtime and call the clinic if she has readings less than 70 or greater than 200 for 3 tests in a row.  - Patient specific target  for A1c; LDL, HDL, Triglycerides, and  Waist Circumference were discussed in detail.  2) BP/HTN:  Her blood pressure is controlled to target.  She is advised to continue Lasix 40 mg po twice daily, Coreg 25 mg po twice daily, Lasix 40 mg po daily, Hydralazine 100 mg po three times daily, Lisinopril 5 mg po daily and Isosorbide 60 mg po daily.  Will defer med changes to nephrologist.  3) Lipids/HPL: Her most recent lipid panel on 05/09/20 shows uncontrolled LDL at 108 and elevated triglycerides of 197.Marland Kitchen  She is advised to continue Atorvastatin 40 mg po daily at bedtime.  Side effects and precautions discussed with her.    4)  Weight/Diet:  Her Body mass index is 35.26 kg/m. She will benefit from continued modest weight loss.  CDE consult in progress, exercise, and carbohydrates information provided.  5) Hypothyroidism: -Her previsit TFTs are consistent with appropriate hormone replacement.  She is advised to continue Levothyroxine 137 mg po daily before breakfast.    - We discussed about the correct intake of her thyroid hormone, on  empty stomach at fasting, with water, separated by at least 30 minutes from breakfast and other medications,  and separated by more than 4 hours from calcium, iron, multivitamins, acid reflux medications (PPIs). -Patient is made aware of the fact that thyroid hormone replacement is needed for life, dose to be adjusted by periodic monitoring of thyroid function tests.  6) Chronic Care/Health Maintenance: -Patient is on ACE and Statin medications and encouraged to continue to follow up with Ophthalmology, Podiatrist at least yearly or according to recommendations, and advised to  stay away from smoking. I have recommended yearly flu vaccine and pneumonia vaccination at least every 5 years; moderate intensity exercise for up to 150 minutes weekly; and  sleep for at least 7 hours a day.  I advised patient to maintain close follow up with her PCP for primary care needs.    I spent 40 minutes in the care of the patient today including review of labs from [place], Lipids, Thyroid Function, Hematology (current and previous including abstractions from other facilities); face-to-face time discussing  her blood glucose readings/logs, discussing hypoglycemia and hyperglycemia episodes and symptoms, medications doses, her options of short and long term treatment based on the latest standards of care / guidelines;  discussion about incorporating lifestyle medicine;  and documenting the encounter.    Please refer to Patient Instructions for Blood Glucose Monitoring and Insulin/Medications Dosing Guide"  in media tab for additional information. Please  also refer to " Patient Self Inventory" in the Media  tab for reviewed elements of pertinent patient history.  Holly Baldwin participated in the discussions, expressed understanding, and voiced agreement with the above plans.  All questions were answered to her satisfaction. she is encouraged to contact clinic should she have any questions or concerns prior to her return  visit.    Follow up plan: Return in about 6 months (around 05/12/2021) for Diabetes F/U with A1c in office, Thyroid follow up, Previsit labs, Bring meter and logs.   Rayetta Pigg, [facility] [address] [place], [place] [address] Phone: [phone] Fax: [phone]  11/12/2020, 4:50 PM

## 2020-11-29 ENCOUNTER — Other Ambulatory Visit: Payer: Self-pay

## 2020-11-29 ENCOUNTER — Encounter (HOSPITAL_COMMUNITY): Payer: Self-pay | Admitting: Emergency Medicine

## 2020-11-29 ENCOUNTER — Emergency Department (HOSPITAL_COMMUNITY)
Admission: EM | Admit: 2020-11-29 | Discharge: 2020-11-29 | Disposition: A | Discharge status: Home / Self Care | Payer: Medicare Other | Attending: Student | Admitting: Student

## 2020-11-29 ENCOUNTER — Emergency Department (HOSPITAL_COMMUNITY): Payer: Medicare Other

## 2020-11-29 DIAGNOSIS — Z79899 Other long term (current) drug therapy: Secondary | ICD-10-CM | POA: Diagnosis not present

## 2020-11-29 DIAGNOSIS — Z794 Long term (current) use of insulin: Secondary | ICD-10-CM | POA: Diagnosis not present

## 2020-11-29 DIAGNOSIS — R55 Syncope and collapse: Secondary | ICD-10-CM | POA: Diagnosis not present

## 2020-11-29 DIAGNOSIS — I5033 Acute on chronic diastolic (congestive) heart failure: Secondary | ICD-10-CM | POA: Diagnosis not present

## 2020-11-29 DIAGNOSIS — I110 Hypertensive heart disease with heart failure: Secondary | ICD-10-CM | POA: Insufficient documentation

## 2020-11-29 DIAGNOSIS — R209 Unspecified disturbances of skin sensation: Secondary | ICD-10-CM | POA: Insufficient documentation

## 2020-11-29 DIAGNOSIS — I951 Orthostatic hypotension: Secondary | ICD-10-CM

## 2020-11-29 DIAGNOSIS — E119 Type 2 diabetes mellitus without complications: Secondary | ICD-10-CM | POA: Diagnosis not present

## 2020-11-29 DIAGNOSIS — E876 Hypokalemia: Secondary | ICD-10-CM | POA: Diagnosis not present

## 2020-11-29 DIAGNOSIS — Z7951 Long term (current) use of inhaled steroids: Secondary | ICD-10-CM | POA: Insufficient documentation

## 2020-11-29 DIAGNOSIS — Z7982 Long term (current) use of aspirin: Secondary | ICD-10-CM | POA: Diagnosis not present

## 2020-11-29 DIAGNOSIS — R531 Weakness: Secondary | ICD-10-CM | POA: Insufficient documentation

## 2020-11-29 DIAGNOSIS — R7989 Other specified abnormal findings of blood chemistry: Secondary | ICD-10-CM | POA: Diagnosis not present

## 2020-11-29 DIAGNOSIS — Z7984 Long term (current) use of oral hypoglycemic drugs: Secondary | ICD-10-CM | POA: Diagnosis not present

## 2020-11-29 DIAGNOSIS — W19XXXA Unspecified fall, initial encounter: Secondary | ICD-10-CM | POA: Diagnosis not present

## 2020-11-29 DIAGNOSIS — E1165 Type 2 diabetes mellitus with hyperglycemia: Secondary | ICD-10-CM | POA: Diagnosis not present

## 2020-11-29 DIAGNOSIS — E039 Hypothyroidism, unspecified: Secondary | ICD-10-CM | POA: Insufficient documentation

## 2020-11-29 LAB — BASIC METABOLIC PANEL
Anion gap: 10 (ref 5–15)
BUN: 43 mg/dL — ABNORMAL HIGH (ref 8–23)
CO2: 24 mmol/L (ref 22–32)
Calcium: 8.5 mg/dL — ABNORMAL LOW (ref 8.9–10.3)
Chloride: 105 mmol/L (ref 98–111)
Creatinine, Ser: 2.26 mg/dL — ABNORMAL HIGH (ref 0.44–1.00)
GFR, Estimated: 22 mL/min — ABNORMAL LOW (ref >60)
Glucose, Bld: 76 mg/dL (ref 70–99)
Potassium: 3.4 mmol/L — ABNORMAL LOW (ref 3.5–5.1)
Sodium: 139 mmol/L (ref 135–145)

## 2020-11-29 LAB — CBC
HCT: 36.1 % (ref 36.0–46.0)
Hemoglobin: 11.3 g/dL — ABNORMAL LOW (ref 12.0–15.0)
MCH: 26.3 pg (ref 26.0–34.0)
MCHC: 31.3 g/dL (ref 30.0–36.0)
MCV: 84.1 fL (ref 80.0–100.0)
Platelets: 251 10*3/uL (ref 150–400)
RBC: 4.29 MIL/uL (ref 3.87–5.11)
RDW: 15.8 % — ABNORMAL HIGH (ref 11.5–15.5)
WBC: 8.5 10*3/uL (ref 4.0–10.5)
nRBC: 0 % (ref 0.0–0.2)

## 2020-11-29 LAB — URINALYSIS, ROUTINE W REFLEX MICROSCOPIC
Bacteria, UA: NONE SEEN
Bilirubin Urine: NEGATIVE
Glucose, UA: NEGATIVE mg/dL
Hgb urine dipstick: NEGATIVE
Ketones, ur: NEGATIVE mg/dL
Leukocytes,Ua: NEGATIVE
Nitrite: NEGATIVE
Protein, ur: >=300 mg/dL — AB
Specific Gravity, Urine: 1.005 (ref 1.005–1.030)
pH: 6 (ref 5.0–8.0)

## 2020-11-29 LAB — CBG MONITORING, ED: Glucose-Capillary: 84 mg/dL (ref 70–99)

## 2020-11-29 LAB — TROPONIN I (HIGH SENSITIVITY)
Troponin I (High Sensitivity): 6 ng/L (ref <18)
Troponin I (High Sensitivity): 7 ng/L (ref <18)

## 2020-11-29 MED ORDER — POTASSIUM CHLORIDE CRYS ER 20 MEQ PO TBCR
40.0000 meq | EXTENDED_RELEASE_TABLET | Freq: Once | ORAL | Status: AC
  Administered 2020-11-29: 40 meq via ORAL
  Filled 2020-11-29: qty 2

## 2020-11-29 MED ORDER — LACTATED RINGERS IV BOLUS
1000.0000 mL | Freq: Once | INTRAVENOUS | Status: AC
  Administered 2020-11-29: 1000 mL via INTRAVENOUS

## 2020-11-29 MED ORDER — MAGNESIUM OXIDE -MG SUPPLEMENT 400 (240 MG) MG PO TABS
800.0000 mg | ORAL_TABLET | Freq: Once | ORAL | Status: AC
  Administered 2020-11-29: 800 mg via ORAL
  Filled 2020-11-29: qty 2

## 2020-11-29 NOTE — ED Provider Notes (Signed)
[facility] Provider Note   CSN: [id number] Arrival date & time: 11/29/20  1538     History Chief Complaint  Patient presents with   Weakness    [facility] is a 75 y.o. female with PMH DM, HTN, HLD, CHF who presents the emergency department for evaluation of presyncope and a fall.  Patient states that she was sitting eating lunch approximately 3 hours prior to arrival when she had acute onset right upper extremity numbness and bilateral lower extremity weakness that then resolved and she was attempting to walk across the room after leaving the table when she had an episode of presyncope and fell onto her husband who is in the recliner.  She did not hit her head or lose consciousness at any point.  Denied chest pain, shortness of breath, abdominal pain, nausea, vomiting, fever prior to this episode of presyncope.  Denies any traumatic complaints.   Weakness Associated symptoms: no abdominal pain, no arthralgias, no chest pain, no cough, no dysuria, no fever, no seizures, no shortness of breath and no vomiting       Past Medical History:  Diagnosis Date   Anxiety    Chronic abdominal pain    Diabetes mellitus    GERD (gastroesophageal reflux disease)    Hiatal hernia    Hypercholesteremia    Hypertension     Patient Active Problem List   Diagnosis Date Noted   Acute on chronic diastolic CHF (congestive heart failure) ([place]) 08/21/2018   Abnormal liver function    Hypertensive emergency    Acute CHF (congestive heart failure) ([place]) 08/20/2018   Hypokalemia 08/20/2018   Anemia 08/20/2018   Acute renal failure superimposed on stage 3 chronic kidney disease ([place]) 08/20/2018   Acute CHF ([place]) 08/20/2018   Acute respiratory failure with hypoxia ([place])    Vitamin D deficiency 03/17/2017   Class 2 severe obesity due to excess calories with serious comorbidity and body mass index (BMI) of 38.0 to 38.9 in adult ([place]) 07/10/2015   Essential hypertension, benign  01/02/2015   Mixed hyperlipidemia 01/02/2015   Unspecified gastritis and gastroduodenitis without mention of hemorrhage 05/06/2012   Abdominal pain 05/05/2012   Nausea & vomiting 05/05/2012   Depression 05/05/2012   GERD (gastroesophageal reflux disease) 05/05/2012   Uncontrolled type 2 diabetes mellitus with complication, with long-term current use of insulin ([place]) 05/05/2012   Primary hypothyroidism 05/05/2012   Hiatal hernia 05/05/2012   Malignant hypertension 05/05/2012   SHOULDER PAIN 03/25/2007   IMPINGEMENT SYNDROME 03/25/2007   RUPTURE ROTATOR CUFF 03/25/2007   HIGH BLOOD PRESSURE 03/25/2007    Past Surgical History:  Procedure Laterality Date   ABDOMINAL HYSTERECTOMY     CATARACT EXTRACTION W/PHACO Right 01/09/2020   Procedure: CATARACT EXTRACTION PHACO AND INTRAOCULAR LENS PLACEMENT ([place]);  Surgeon: Baruch Goldmann, MD;  Location: [facility];  Service: Ophthalmology;  Laterality: Right;  CDE: 12.01   CATARACT EXTRACTION W/PHACO Left 01/23/2020   Procedure: CATARACT EXTRACTION PHACO AND INTRAOCULAR LENS PLACEMENT LEFT EYE;  Surgeon: Baruch Goldmann, MD;  Location: [facility];  Service: Ophthalmology;  Laterality: Left;  CDE 8.71   COLONOSCOPY N/A 06/23/2012   Procedure: COLONOSCOPY;  Surgeon: Rogene Houston, MD;  Location: [facility];  Service: Endoscopy;  Laterality: N/A;  100-moved to 1200 Ann to notify pt   ESOPHAGOGASTRODUODENOSCOPY N/A 05/06/2012   Procedure: ESOPHAGOGASTRODUODENOSCOPY (EGD);  Surgeon: Rogene Houston, MD;  Location: [facility];  Service: Endoscopy;  Laterality: N/A;   MASS EXCISION Right  05/26/2012   Procedure: EXCISION NEOPLASM RIGHT THIGH;  Surgeon: Jamesetta So, MD;  Location: [facility];  Service: General;  Laterality: Right;     OB History   No obstetric history on file.     Family History  Problem Relation Age of Onset   Diabetes Mother    Diabetes Father    Stroke Father    Diabetes Sister    Diabetes Brother    Colon cancer Neg Hx    Colon  polyps Neg Hx     Social History   Tobacco Use   Smoking status: Never   Smokeless tobacco: Never  Vaping Use   Vaping Use: Never used  Substance Use Topics   Alcohol use: No   Drug use: No    Home Medications Prior to Admission medications   Medication Sig Start Date End Date Taking? Authorizing Provider  albuterol (PROVENTIL HFA;VENTOLIN HFA) 108 (90 Base) MCG/ACT inhaler Inhale 1-2 puffs into the lungs every 6 (six) hours as needed for wheezing or shortness of breath.     [provider]  aspirin EC 81 MG tablet Take 81 mg by mouth at bedtime.    [provider]  benzonatate (TESSALON) 200 MG capsule Take 200 mg by mouth 3 (three) times daily as needed. 10/01/20   [provider]  Blood Glucose Monitoring Suppl (ACCU-CHEK AVIVA PLUS) w/Device KIT  03/09/20   [provider]  carvedilol (COREG) 25 MG tablet  02/27/20   [provider]  clotrimazole-betamethasone (LOTRISONE) cream Apply topically 2 (two) times daily. 04/06/20   [provider]  EPINEPHrine (EPI-PEN) 0.3 mg/0.3 mL DEVI Inject 0.3 mg into the muscle once.    [provider]  furosemide (LASIX) 40 MG tablet Take 40 mg by mouth 2 (two) times daily. 08/30/20   [provider]  gabapentin (NEURONTIN) 100 MG capsule TAKE 1 CAPSULE TWICE DAILY 12/31/19   Nida, Marella Chimes, MD  glipiZIDE (GLUCOTROL) 5 MG tablet Take by mouth daily before breakfast.    [provider]  glucose blood (ACCU-CHEK AVIVA PLUS) test strip TEST BLOOD GLUCOSE FOUR TIMES DAILY 05/02/20   Cassandria Anger, MD  hydrALAZINE (APRESOLINE) 100 MG tablet Take 1 tablet (100 mg total) by mouth 3 (three) times daily. 08/25/18   Barton Dubois, MD  insulin lispro (HUMALOG) 100 UNIT/ML KwikPen Inject 10 Units into the skin 3 (three) times daily. With meals 04/04/20   Brita Romp, NP  isosorbide mononitrate (IMDUR) 60 MG 24 hr tablet Take 1 tablet (60 mg total) by mouth daily.  08/26/18   Barton Dubois, MD  LANTUS SOLOSTAR 100 UNIT/ML Solostar Pen INJECT 40 UNITS SUBCUTANEOUSLY AT 10PM. 09/10/20   Cassandria Anger, MD  levothyroxine (SYNTHROID) 137 MCG tablet TAKE 1 TABLET (137 MCG TOTAL) BY MOUTH DAILY BEFORE BREAKFAST. 12/20/19   Cassandria Anger, MD  lisinopril (ZESTRIL) 5 MG tablet Take 5 mg by mouth daily. 03/21/20   [provider]  moxifloxacin (VIGAMOX) 0.5 % ophthalmic solution Apply to eye. 12/16/19   [provider]  Multiple Vitamin (MULTIVITAMIN WITH MINERALS) TABS tablet Take 1 tablet by mouth daily.    [provider]  NIFEdipine (PROCARDIA XL/NIFEDICAL XL) 60 MG 24 hr tablet Take 60 mg by mouth daily. 12/19/19   [provider]  silver sulfADIAZINE (SILVADENE) 1 % cream Apply to area on the breast  3 times daily 05/07/20   Florian Buff, MD  simvastatin (ZOCOR) 40 MG tablet TAKE 1 TABLET  EVERY DAY Patient taking differently: Take 40 mg by mouth daily. 10/05/17   Fayrene Helper, MD  sulfamethoxazole-trimethoprim (BACTRIM DS) 800-160 MG tablet Take 1 tablet by mouth 2 (two) times daily. 05/07/20   Florian Buff, MD  ziprasidone (GEODON) 40 MG capsule Take by mouth daily as needed. 10/27/20   [provider]    Allergies    Bee venom, Ace inhibitors, Penicillin g, and Penicillins  Review of Systems   Review of Systems  Constitutional:  Negative for chills and fever.  HENT:  Negative for ear pain and sore throat.   Eyes:  Negative for pain and visual disturbance.  Respiratory:  Negative for cough and shortness of breath.   Cardiovascular:  Negative for chest pain and palpitations.  Gastrointestinal:  Negative for abdominal pain and vomiting.  Genitourinary:  Negative for dysuria and hematuria.  Musculoskeletal:  Negative for arthralgias and back pain.  Skin:  Negative for color change and rash.  Neurological:  Positive for syncope and weakness. Negative for seizures.  All other systems reviewed  and are negative.  Physical Exam Updated Vital Signs BP (!) 150/56   Pulse 72   Temp 98.5 F (36.9 C) (Oral)   Resp 19   SpO2 100%   Physical Exam  ED Results / Procedures / Treatments   Labs (all labs ordered are listed, but only abnormal results are displayed) Labs Reviewed  BASIC METABOLIC PANEL - Abnormal; Notable for the following components:      Result Value   Potassium 3.4 (*)    BUN 43 (*)    Creatinine, Ser 2.26 (*)    Calcium 8.5 (*)    GFR, Estimated 22 (*)    All other components within normal limits  URINALYSIS, ROUTINE W REFLEX MICROSCOPIC - Abnormal; Notable for the following components:   Color, Urine STRAW (*)    Protein, ur >=300 (*)    All other components within normal limits  CBC - Abnormal; Notable for the following components:   Hemoglobin 11.3 (*)    RDW 15.8 (*)    All other components within normal limits  CBG MONITORING, ED  TROPONIN I (HIGH SENSITIVITY)  TROPONIN I (HIGH SENSITIVITY)    EKG None  Radiology CT Head Wo Contrast  Result Date: 11/29/2020 CLINICAL DATA:  Syncope EXAM: CT HEAD WITHOUT CONTRAST TECHNIQUE: Contiguous axial images were obtained from the base of the skull through the vertex without intravenous contrast. COMPARISON:  CT brain 08/27/2018 FINDINGS: Brain: No acute territorial infarction, hemorrhage, or intracranial. Mild atrophy. Moderate chronic small vessel ischemic changes of the white matter. Chronic lacunar infarct within left basal ganglia. Left deep white matter hypodensity, new since CT from 2020, suspected to represent interval lacunar infarct but appears chronic. Nonenlarged ventricles Vascular: No hyperdense vessels.  Carotid vascular calcification Skull: Normal. Negative for fracture or focal lesion. Sinuses/Orbits: No acute finding. Other: None IMPRESSION: 1. No CT evidence for acute intracranial abnormality. 2. Atrophy and chronic small vessel ischemic changes of the white matter. Chronic appearing lacunar  infarcts in the left basal ganglia deep white matter Electronically Signed   By: Donavan Foil M.D.   On: 11/29/2020 18:49    Procedures Procedures   Medications Ordered in ED Medications  lactated ringers bolus 1,000 mL (0 mLs Intravenous Stopped 11/29/20 2101)  potassium chloride SA (KLOR-CON) CR tablet 40 mEq (40 mEq Oral Given 11/29/20 2112)  magnesium oxide (MAG-OX) tablet 800 mg (800 mg Oral Given 11/29/20 2113)    ED  Course  I have reviewed the triage vital signs and the nursing notes.  Pertinent labs & imaging results that were available during my care of the patient were reviewed by me and considered in my medical decision making (see chart for details).    MDM Rules/Calculators/A&P                           Patient seen the emergency department for evaluation of lower extremity weakness.  Physical exam is unremarkable with no focal motor or sensory deficits.  No cranial nerve deficits.  Patient has 5 out of 5 strength in bilateral lower extremities.  Laboratory evaluation reveals mild hypokalemia 3.4 which was repleted here in the emergency department, creatinine elevated to 2.26 which is the patient's baseline.  Urinalysis unremarkable.  ECG nonischemic, with a mildly prolonged QT, but the baseline is wavy.  She was given a liter of fluids and was able to ambulate in the emergency department without any difficulty.  Patient's presentation is consistent with orthostatic syncope as her symptoms occurred only after going from sitting to standing after eating dinner today.  She was given strict return precautions which she voiced understanding and she was discharged with PCP follow-up with instructions to aggressively hydrate at home. Final Clinical Impression(s) / ED Diagnoses Final diagnoses:  Orthostatic syncope    Rx / DC Orders ED Discharge Orders     None        Jorma Tassinari, Debe Coder, MD 11/29/20 2359

## 2020-11-29 NOTE — ED Notes (Signed)
Checked on pt , put pt on a purewick because pt needed to use the restroom.

## 2020-11-29 NOTE — ED Triage Notes (Signed)
Pt reports while walking at home she began to feel weak and "fell onto my husband in the recliner." Pt denies LOC. Denies pain.

## 2020-11-29 NOTE — ED Notes (Signed)
Pt ambulated halls without difficulty.

## 2020-11-30 DIAGNOSIS — E1129 Type 2 diabetes mellitus with other diabetic kidney complication: Secondary | ICD-10-CM | POA: Diagnosis not present

## 2020-11-30 DIAGNOSIS — I10 Essential (primary) hypertension: Secondary | ICD-10-CM | POA: Diagnosis not present

## 2020-12-08 ENCOUNTER — Emergency Department (HOSPITAL_COMMUNITY): Payer: Medicare Other

## 2020-12-08 ENCOUNTER — Other Ambulatory Visit: Payer: Self-pay

## 2020-12-08 ENCOUNTER — Observation Stay (HOSPITAL_COMMUNITY)
Admission: EM | Admit: 2020-12-08 | Discharge: 2020-12-10 | Disposition: A | Discharge status: Home / Self Care | Payer: Medicare Other | Attending: Emergency Medicine | Admitting: Emergency Medicine

## 2020-12-08 ENCOUNTER — Encounter (HOSPITAL_COMMUNITY): Payer: Self-pay | Admitting: Emergency Medicine

## 2020-12-08 DIAGNOSIS — Z794 Long term (current) use of insulin: Secondary | ICD-10-CM | POA: Insufficient documentation

## 2020-12-08 DIAGNOSIS — Z7982 Long term (current) use of aspirin: Secondary | ICD-10-CM | POA: Insufficient documentation

## 2020-12-08 DIAGNOSIS — G459 Transient cerebral ischemic attack, unspecified: Secondary | ICD-10-CM | POA: Diagnosis present

## 2020-12-08 DIAGNOSIS — E119 Type 2 diabetes mellitus without complications: Secondary | ICD-10-CM | POA: Insufficient documentation

## 2020-12-08 DIAGNOSIS — E039 Hypothyroidism, unspecified: Secondary | ICD-10-CM | POA: Diagnosis present

## 2020-12-08 DIAGNOSIS — R29818 Other symptoms and signs involving the nervous system: Secondary | ICD-10-CM | POA: Diagnosis not present

## 2020-12-08 DIAGNOSIS — I10 Essential (primary) hypertension: Secondary | ICD-10-CM | POA: Diagnosis not present

## 2020-12-08 DIAGNOSIS — I5032 Chronic diastolic (congestive) heart failure: Secondary | ICD-10-CM | POA: Diagnosis not present

## 2020-12-08 DIAGNOSIS — R202 Paresthesia of skin: Secondary | ICD-10-CM | POA: Diagnosis not present

## 2020-12-08 DIAGNOSIS — R531 Weakness: Secondary | ICD-10-CM | POA: Diagnosis not present

## 2020-12-08 DIAGNOSIS — Z79899 Other long term (current) drug therapy: Secondary | ICD-10-CM | POA: Diagnosis not present

## 2020-12-08 DIAGNOSIS — I6782 Cerebral ischemia: Secondary | ICD-10-CM | POA: Diagnosis not present

## 2020-12-08 DIAGNOSIS — I639 Cerebral infarction, unspecified: Secondary | ICD-10-CM | POA: Diagnosis not present

## 2020-12-08 DIAGNOSIS — Z20822 Contact with and (suspected) exposure to covid-19: Secondary | ICD-10-CM | POA: Diagnosis not present

## 2020-12-08 DIAGNOSIS — E876 Hypokalemia: Secondary | ICD-10-CM | POA: Diagnosis not present

## 2020-12-08 DIAGNOSIS — E7800 Pure hypercholesterolemia, unspecified: Secondary | ICD-10-CM | POA: Diagnosis present

## 2020-12-08 LAB — CBC WITH DIFFERENTIAL/PLATELET
Abs Immature Granulocytes: 0.03 10*3/uL (ref 0.00–0.07)
Basophils Absolute: 0.1 10*3/uL (ref 0.0–0.1)
Basophils Relative: 1 %
Eosinophils Absolute: 0.1 10*3/uL (ref 0.0–0.5)
Eosinophils Relative: 2 %
HCT: 35.3 % — ABNORMAL LOW (ref 36.0–46.0)
Hemoglobin: 11.2 g/dL — ABNORMAL LOW (ref 12.0–15.0)
Immature Granulocytes: 0 %
Lymphocytes Relative: 19 %
Lymphs Abs: 1.3 10*3/uL (ref 0.7–4.0)
MCH: 26.7 pg (ref 26.0–34.0)
MCHC: 31.7 g/dL (ref 30.0–36.0)
MCV: 84.2 fL (ref 80.0–100.0)
Monocytes Absolute: 0.7 10*3/uL (ref 0.1–1.0)
Monocytes Relative: 10 %
Neutro Abs: 4.5 10*3/uL (ref 1.7–7.7)
Neutrophils Relative %: 68 %
Platelets: 266 10*3/uL (ref 150–400)
RBC: 4.19 MIL/uL (ref 3.87–5.11)
RDW: 16 % — ABNORMAL HIGH (ref 11.5–15.5)
WBC: 6.7 10*3/uL (ref 4.0–10.5)
nRBC: 0 % (ref 0.0–0.2)

## 2020-12-08 LAB — COMPREHENSIVE METABOLIC PANEL
ALT: 13 U/L (ref 0–44)
AST: 14 U/L — ABNORMAL LOW (ref 15–41)
Albumin: 3.5 g/dL (ref 3.5–5.0)
Alkaline Phosphatase: 117 U/L (ref 38–126)
Anion gap: 8 (ref 5–15)
BUN: 48 mg/dL — ABNORMAL HIGH (ref 8–23)
CO2: 24 mmol/L (ref 22–32)
Calcium: 8.4 mg/dL — ABNORMAL LOW (ref 8.9–10.3)
Chloride: 108 mmol/L (ref 98–111)
Creatinine, Ser: 1.97 mg/dL — ABNORMAL HIGH (ref 0.44–1.00)
GFR, Estimated: 26 mL/min — ABNORMAL LOW (ref >60)
Glucose, Bld: 237 mg/dL — ABNORMAL HIGH (ref 70–99)
Potassium: 3.4 mmol/L — ABNORMAL LOW (ref 3.5–5.1)
Sodium: 140 mmol/L (ref 135–145)
Total Bilirubin: 0.9 mg/dL (ref 0.3–1.2)
Total Protein: 6.7 g/dL (ref 6.5–8.1)

## 2020-12-08 LAB — PROTIME-INR
INR: 1 (ref 0.8–1.2)
Prothrombin Time: 13.2 seconds (ref 11.4–15.2)

## 2020-12-08 LAB — RESP PANEL BY RT-PCR (FLU A&B, COVID) ARPGX2
Influenza A by PCR: NEGATIVE
Influenza B by PCR: NEGATIVE
SARS Coronavirus 2 by RT PCR: NEGATIVE

## 2020-12-08 LAB — CBG MONITORING, ED: Glucose-Capillary: 176 mg/dL — ABNORMAL HIGH (ref 70–99)

## 2020-12-08 MED ORDER — INSULIN GLARGINE-YFGN 100 UNIT/ML ~~LOC~~ SOLN
20.0000 [IU] | Freq: Every day | SUBCUTANEOUS | Status: DC
  Administered 2020-12-09: 20 [IU] via SUBCUTANEOUS
  Filled 2020-12-08 (×3): qty 0.2

## 2020-12-08 MED ORDER — LEVOTHYROXINE SODIUM 25 MCG PO TABS
137.0000 ug | ORAL_TABLET | Freq: Every day | ORAL | Status: DC
  Administered 2020-12-09 – 2020-12-10 (×2): 137 ug via ORAL
  Filled 2020-12-08 (×2): qty 1

## 2020-12-08 MED ORDER — ACETAMINOPHEN 325 MG PO TABS
650.0000 mg | ORAL_TABLET | Freq: Four times a day (QID) | ORAL | Status: DC | PRN
  Administered 2020-12-08: 650 mg via ORAL
  Filled 2020-12-08: qty 2

## 2020-12-08 MED ORDER — ACETAMINOPHEN 650 MG RE SUPP: 650.0000 mg | Freq: Four times a day (QID) | RECTAL | Status: DC | PRN

## 2020-12-08 MED ORDER — FUROSEMIDE 40 MG PO TABS
40.0000 mg | ORAL_TABLET | Freq: Two times a day (BID) | ORAL | Status: DC
  Administered 2020-12-09 – 2020-12-10 (×3): 40 mg via ORAL
  Filled 2020-12-08 (×3): qty 1

## 2020-12-08 MED ORDER — SIMVASTATIN 20 MG PO TABS
40.0000 mg | ORAL_TABLET | Freq: Every day | ORAL | Status: DC
  Administered 2020-12-09 – 2020-12-10 (×2): 40 mg via ORAL
  Filled 2020-12-08 (×2): qty 2

## 2020-12-08 MED ORDER — POTASSIUM CHLORIDE CRYS ER 20 MEQ PO TBCR
20.0000 meq | EXTENDED_RELEASE_TABLET | Freq: Once | ORAL | Status: AC
  Administered 2020-12-08: 20 meq via ORAL
  Filled 2020-12-08: qty 1

## 2020-12-08 MED ORDER — HYDRALAZINE HCL 20 MG/ML IJ SOLN
10.0000 mg | INTRAMUSCULAR | Status: DC | PRN
  Filled 2020-12-08: qty 1

## 2020-12-08 MED ORDER — ASPIRIN EC 81 MG PO TBEC
81.0000 mg | DELAYED_RELEASE_TABLET | Freq: Every day | ORAL | Status: DC
  Administered 2020-12-08 – 2020-12-09 (×2): 81 mg via ORAL
  Filled 2020-12-08 (×2): qty 1

## 2020-12-08 MED ORDER — STROKE: EARLY STAGES OF RECOVERY BOOK: Freq: Once | Status: DC

## 2020-12-08 MED ORDER — INSULIN ASPART 100 UNIT/ML IJ SOLN
0.0000 [IU] | Freq: Every day | INTRAMUSCULAR | Status: DC
  Administered 2020-12-09: 2 [IU] via SUBCUTANEOUS

## 2020-12-08 MED ORDER — GABAPENTIN 100 MG PO CAPS
100.0000 mg | ORAL_CAPSULE | Freq: Two times a day (BID) | ORAL | Status: DC
  Administered 2020-12-08 – 2020-12-10 (×4): 100 mg via ORAL
  Filled 2020-12-08 (×4): qty 1

## 2020-12-08 MED ORDER — LABETALOL HCL 5 MG/ML IV SOLN
10.0000 mg | Freq: Once | INTRAVENOUS | Status: DC
  Filled 2020-12-08: qty 4

## 2020-12-08 MED ORDER — LORAZEPAM 2 MG/ML IJ SOLN
1.0000 mg | Freq: Once | INTRAMUSCULAR | Status: AC
  Administered 2020-12-08: 1 mg via INTRAVENOUS
  Filled 2020-12-08: qty 1

## 2020-12-08 MED ORDER — INSULIN ASPART 100 UNIT/ML IJ SOLN
0.0000 [IU] | Freq: Three times a day (TID) | INTRAMUSCULAR | Status: DC
  Administered 2020-12-09 (×2): 2 [IU] via SUBCUTANEOUS

## 2020-12-08 NOTE — Consult Note (Signed)
NEURO HOSPITALIST CONSULT NOTE   Requestig physician: Dr. Gilford Raid  Reason for Consult: Transient right arm and leg numbness  History obtained from:  Patient and Chart     HPI:                                                                                                                                          [facility] is an 75 y.o. female with a PMHx of anxiety, DM, CHF, chronic abdominal pain, hypercholesterolemia and HTN who presented to the ED this afternoon with complaints of recurrence of right arm and leg numbness. The patient had been seen in the [facility] ED on 9/29 after an acute episode of transient  right upper extremity numbness and bilateral lower extremity weakness followed by an episode of presyncope and a controlled fall; exam was normal at that time. She was diagnosed with orthostatic syncope, given IVF and discharged home at that time.   By the time she presented to the [facility] ED today, her right sided numbness had resolved, she was able to walk unassisted and she denied any dizziness with standing. She stated that her symptoms started at about 1 PM, after she went from a sitting to a standing position; at that point, she felt a tingling sensation in her arms and legs and was unable to move her right leg or walk due to the numbness. There was no associated headache, change in vision, slurred speech or difficulty with word finding. Her sypmtoms lasted for approximately 10 to 20 minutes and then spontaneously resolved.   BP in the ED was elevated in the 190s.   She has no history of TIAs or CVA in the past.   Home medications include ASA and simvastatin.   Past Medical History:  Diagnosis Date   Anxiety    Chronic abdominal pain    Diabetes mellitus    GERD (gastroesophageal reflux disease)    Hiatal hernia    Hypercholesteremia    Hypertension     Past Surgical History:  Procedure Laterality Date   ABDOMINAL HYSTERECTOMY     CATARACT EXTRACTION  W/PHACO Right 01/09/2020   Procedure: CATARACT EXTRACTION PHACO AND INTRAOCULAR LENS PLACEMENT ([place]);  Surgeon: Baruch Goldmann, MD;  Location: [facility];  Service: Ophthalmology;  Laterality: Right;  CDE: 12.01   CATARACT EXTRACTION W/PHACO Left 01/23/2020   Procedure: CATARACT EXTRACTION PHACO AND INTRAOCULAR LENS PLACEMENT LEFT EYE;  Surgeon: Baruch Goldmann, MD;  Location: [facility];  Service: Ophthalmology;  Laterality: Left;  CDE 8.71   COLONOSCOPY N/A 06/23/2012   Procedure: COLONOSCOPY;  Surgeon: Rogene Houston, MD;  [facility];  Service: Endoscopy;  Laterality: N/A;  100-moved to 1200 Ann to notify pt   ESOPHAGOGASTRODUODENOSCOPY N/A 05/06/2012   Procedure: ESOPHAGOGASTRODUODENOSCOPY (EGD);  Surgeon: Rogene Houston, MD;  [facility];  Service: Endoscopy;  Laterality: N/A;   MASS EXCISION Right 05/26/2012   Procedure: EXCISION NEOPLASM RIGHT THIGH;  Surgeon: Jamesetta So, MD;  Location: [facility];  Service: General;  Laterality: Right;    Family History  Problem Relation Age of Onset   Diabetes Mother    Diabetes Father    Stroke Father    Diabetes Sister    Diabetes Brother    Colon cancer Neg Hx    Colon polyps Neg Hx             Social History:  reports that she has never smoked. She has never used smokeless tobacco. She reports that she does not drink alcohol and does not use drugs.  Allergies  Allergen Reactions   Bee Venom Anaphylaxis   Ace Inhibitors    Penicillin G    Penicillins Hives    .Has patient had a PCN reaction causing immediate rash, facial/tongue/throat swelling, SOB or lightheadedness with hypotension: Yes Has patient had a PCN reaction causing severe rash involving mucus membranes or skin necrosis: No Has patient had a PCN reaction that required hospitalization: Yes Has patient had a PCN reaction occurring within the last 10 years: No If all of the above answers are "NO", then may proceed with Cephalosporin use.     MEDICATIONS:                                                                                                                      Prior to Admission: (Not in a hospital admission)  Scheduled:  labetalol  10 mg Intravenous Once   LORazepam  1 mg Intravenous Once   Continuous:   ROS:                                                                                                                                       Positive for CP., SOB, headaches, fevers, chills and worsening pedal edema. Other ROS as per HPI.   Blood pressure (!) 184/71, pulse 69, temperature 98.6 F (37 C), temperature source Oral, resp. rate 18, height 5\' 4"  (1.626 m), weight 93.2 kg, SpO2 100 %.   General Examination:  Physical Exam  HEENT-  [place]/AT   Lungs- Respirations unlabored Extremities- Warm and well perfused  Neurological Examination Mental Status: Awake and alert. Oriented to the hospital, circumstance, state, year (with some difficulty), month and day of the week. Not oriented to the city. Speech is fluent and nondysarthric. Naming for common objects is intact. Able to follow all commands. Becomes somewhat  confused when asked more complex questions.  Cranial Nerves: II: Temporal visual fields intact with no extinction to DSS. PERRL.  III,IV, VI: No ptosis. EOMI. No nystagmus.  V,VII: Smile symmetric, facial temp sensation normal bilaterally VIII: hearing intact to voice IX,X: No hypophonia or hoarseness XI: Symmetric XII: Midline tongue extension Motor: Right : Upper extremity   5/5    Left:     Upper extremity   5/5  Lower extremity   5/5     Lower extremity   5/5 No pronator drift.  Sensory: Temp and light touch intact throughout, bilaterally. No extinction to DSS.  Deep Tendon Reflexes: 2+ bilateral brachioradialis. 0 patellar and achilles reflexes. Toes downgoing bilaterally.  Cerebellar: No ataxia with FNF  bilaterally  Gait: Deferred   Lab Results: Basic Metabolic Panel: Recent Labs  Lab 12/08/20 1550  NA 140  K 3.4*  CL 108  CO2 24  GLUCOSE 237*  BUN 48*  CREATININE 1.97*  CALCIUM 8.4*    CBC: Recent Labs  Lab 12/08/20 1550  WBC 6.7  NEUTROABS 4.5  HGB 11.2*  HCT 35.3*  MCV 84.2  PLT 266    Cardiac Enzymes: No results for input(s): CKTOTAL, CKMB, CKMBINDEX, TROPONINI in the last 168 hours.  Lipid Panel: No results for input(s): CHOL, TRIG, HDL, CHOLHDL, VLDL, LDLCALC in the last 168 hours.  Imaging: CT Head Wo Contrast  Result Date: 12/08/2020 CLINICAL DATA:  Neurologic deficit EXAM: CT HEAD WITHOUT CONTRAST TECHNIQUE: Contiguous axial images were obtained from the base of the skull through the vertex without intravenous contrast. COMPARISON:  11/29/2020 FINDINGS: Brain: Stable small-vessel ischemic changes throughout the periventricular white matter and left basal ganglia. No acute infarct or hemorrhage. Lateral ventricles and remaining midline structures are unremarkable. No acute extra-axial fluid collections. No mass effect. Vascular: No hyperdense vessel or unexpected calcification. Skull: Normal. Negative for fracture or focal lesion. Sinuses/Orbits: No acute finding. Other: None. IMPRESSION: 1. Stable head CT, no acute process. Electronically Signed   By: Randa Ngo M.D.   On: 12/08/2020 19:03     Assessment: 75 year old female with 2 episodes of transient right arm and leg numbness, the first of which was also associated with weakness.  1. Exam reveals no focal motor or sensory deficit. Mild disorientation is noted, in addition to difficulty answering complex questions appropriately.  2. CT head reveals stable small-vessel ischemic changes throughout the periventricular white matter and left basal ganglia. 3. Home medications include ASA and simvastatin.  4. Stroke risk factors:  DM, CHF, hypercholesterolemia and HTN  5. ABCD2: 5 6. Most likely etiology for  the patient's presentation is TIA.   Recommendations: 1. HgbA1c, fasting lipid panel 2. MRI/MRA of the brain without contrast 3. PT consult, OT consult, Speech consult 4. Echocardiogram 5. Continue ASA and statin. Consider addition of Plavix pending work up results.  6. Carotid ultrasound.   7. Risk factor modification 8. Telemetry monitoring 9. Frequent neuro checks 10. NPO until passes stroke swallow screen    Electronically signed: Dr. Kerney Elbe 12/08/2020, 7:26 PM

## 2020-12-08 NOTE — ED Provider Notes (Signed)
[facility] Provider Note   CSN: [id number] Arrival date & time: 12/08/20  1430     History Chief Complaint  Patient presents with   Arm and leg numbness    Holly Baldwin is a 75 y.o. female.  HPI  Patient with significant medical history of anxiety, diabetes, hypertension presents to the emergency department with chief complaint of unilateral numbness/weakness.  She states it started around 1 PM today, started after she went from a sitting to a standing position, states that she had a tingling sensation in her arms and legs and was unable to move her right leg or walk due to the numbness.  She had no headaches, change in vision, denies slurring her words, or difficulty with word finding. States it lasted approximately 10 to 20 minutes then resolve on its own.  She currently has no complaints at this time.  She has no history of TIAs or CVA in the past, she denies recent head trauma, is not on anticoagulant.  She does states that she was here last week for very similar presentation and was thought this was orthostatic.  Patient endorses headaches, fevers, chills, chest pain, shortness breath, worsening pedal edema.  Past Medical History:  Diagnosis Date   Anxiety    Chronic abdominal pain    Diabetes mellitus    GERD (gastroesophageal reflux disease)    Hiatal hernia    Hypercholesteremia    Hypertension     Patient Active Problem List   Diagnosis Date Noted   Acute on chronic diastolic CHF (congestive heart failure) ([place]) 08/21/2018   Abnormal liver function    Hypertensive emergency    Acute CHF (congestive heart failure) ([place]) 08/20/2018   Hypokalemia 08/20/2018   Anemia 08/20/2018   Acute renal failure superimposed on stage 3 chronic kidney disease ([place]) 08/20/2018   Acute CHF ([place]) 08/20/2018   Acute respiratory failure with hypoxia ([place])    Vitamin D deficiency 03/17/2017   Class 2 severe obesity due to excess calories with serious comorbidity and  body mass index (BMI) of 38.0 to 38.9 in adult ([place]) 07/10/2015   Essential hypertension, benign 01/02/2015   Mixed hyperlipidemia 01/02/2015   Unspecified gastritis and gastroduodenitis without mention of hemorrhage 05/06/2012   Abdominal pain 05/05/2012   Nausea & vomiting 05/05/2012   Depression 05/05/2012   GERD (gastroesophageal reflux disease) 05/05/2012   Uncontrolled type 2 diabetes mellitus with complication, with long-term current use of insulin 05/05/2012   Primary hypothyroidism 05/05/2012   Hiatal hernia 05/05/2012   Malignant hypertension 05/05/2012   SHOULDER PAIN 03/25/2007   IMPINGEMENT SYNDROME 03/25/2007   RUPTURE ROTATOR CUFF 03/25/2007   HIGH BLOOD PRESSURE 03/25/2007    Past Surgical History:  Procedure Laterality Date   ABDOMINAL HYSTERECTOMY     CATARACT EXTRACTION W/PHACO Right 01/09/2020   Procedure: CATARACT EXTRACTION PHACO AND INTRAOCULAR LENS PLACEMENT ([place]);  Surgeon: Baruch Goldmann, MD;  Location: [facility];  Service: Ophthalmology;  Laterality: Right;  CDE: 12.01   CATARACT EXTRACTION W/PHACO Left 01/23/2020   Procedure: CATARACT EXTRACTION PHACO AND INTRAOCULAR LENS PLACEMENT LEFT EYE;  Surgeon: Baruch Goldmann, MD;  Location: [facility];  Service: Ophthalmology;  Laterality: Left;  CDE 8.71   COLONOSCOPY N/A 06/23/2012   Procedure: COLONOSCOPY;  Surgeon: Rogene Houston, MD;  Location: [facility];  Service: Endoscopy;  Laterality: N/A;  100-moved to 1200 Ann to notify pt   ESOPHAGOGASTRODUODENOSCOPY N/A 05/06/2012   Procedure: ESOPHAGOGASTRODUODENOSCOPY (EGD);  Surgeon: Rogene Houston, MD;  Location: [facility];  Service: Endoscopy;  Laterality: N/A;   MASS EXCISION Right 05/26/2012   Procedure: EXCISION NEOPLASM RIGHT THIGH;  Surgeon: Jamesetta So, MD;  Location: [facility];  Service: General;  Laterality: Right;     OB History   No obstetric history on file.     Family History  Problem Relation Age of Onset   Diabetes Mother    Diabetes Father     Stroke Father    Diabetes Sister    Diabetes Brother    Colon cancer Neg Hx    Colon polyps Neg Hx     Social History   Tobacco Use   Smoking status: Never   Smokeless tobacco: Never  Vaping Use   Vaping Use: Never used  Substance Use Topics   Alcohol use: No   Drug use: No    Home Medications Prior to Admission medications   Medication Sig Start Date End Date Taking? Authorizing Provider  calcitRIOL (ROCALTROL) 0.25 MCG capsule TAKE ONE CAPSULE ON MONDAY, WEDNESDAY AND FRIDAY. 11/28/20  Yes [provider]  albuterol (PROVENTIL HFA;VENTOLIN HFA) 108 (90 Base) MCG/ACT inhaler Inhale 1-2 puffs into the lungs every 6 (six) hours as needed for wheezing or shortness of breath.     [provider]  aspirin EC 81 MG tablet Take 81 mg by mouth at bedtime.    [provider]  benzonatate (TESSALON) 200 MG capsule Take 200 mg by mouth 3 (three) times daily as needed. 10/01/20   [provider]  Blood Glucose Monitoring Suppl (ACCU-CHEK AVIVA PLUS) w/Device KIT  03/09/20   [provider]  carvedilol (COREG) 25 MG tablet  02/27/20   [provider]  clotrimazole-betamethasone (LOTRISONE) cream Apply topically 2 (two) times daily. 04/06/20   [provider]  EPINEPHrine (EPI-PEN) 0.3 mg/0.3 mL DEVI Inject 0.3 mg into the muscle once.    [provider]  furosemide (LASIX) 40 MG tablet Take 40 mg by mouth 2 (two) times daily. 08/30/20   [provider]  gabapentin (NEURONTIN) 100 MG capsule TAKE 1 CAPSULE TWICE DAILY 12/31/19   Nida, Marella Chimes, MD  glipiZIDE (GLUCOTROL) 5 MG tablet Take by mouth daily before breakfast.    [provider]  glucose blood (ACCU-CHEK AVIVA PLUS) test strip TEST BLOOD GLUCOSE FOUR TIMES DAILY 05/02/20   Cassandria Anger, MD  hydrALAZINE (APRESOLINE) 100 MG tablet Take 1 tablet (100 mg total) by mouth 3 (three) times daily. 08/25/18   Barton Dubois, MD  insulin lispro  (HUMALOG) 100 UNIT/ML KwikPen Inject 10 Units into the skin 3 (three) times daily. With meals 04/04/20   Brita Romp, NP  isosorbide mononitrate (IMDUR) 60 MG 24 hr tablet Take 1 tablet (60 mg total) by mouth daily. 08/26/18   Barton Dubois, MD  LANTUS SOLOSTAR 100 UNIT/ML Solostar Pen INJECT 40 UNITS SUBCUTANEOUSLY AT 10PM. 09/10/20   Cassandria Anger, MD  levothyroxine (SYNTHROID) 137 MCG tablet TAKE 1 TABLET (137 MCG TOTAL) BY MOUTH DAILY BEFORE BREAKFAST. 12/20/19   Cassandria Anger, MD  lisinopril (ZESTRIL) 5 MG tablet Take 5 mg by mouth daily. 03/21/20   [provider]  moxifloxacin (VIGAMOX) 0.5 % ophthalmic solution Apply to eye. 12/16/19   [provider]  Multiple Vitamin (MULTIVITAMIN WITH MINERALS) TABS tablet Take 1 tablet by mouth daily.    [provider]  NIFEdipine (PROCARDIA XL/NIFEDICAL XL) 60 MG 24 hr tablet Take 60 mg by mouth daily. 12/19/19   [provider]  silver sulfADIAZINE (SILVADENE) 1 % cream Apply to area on the breast  3 times daily 05/07/20   Florian Buff, MD  simvastatin (ZOCOR) 40 MG tablet TAKE 1 TABLET EVERY DAY Patient taking differently: Take 40 mg by mouth daily. 10/05/17   Fayrene Helper, MD  sulfamethoxazole-trimethoprim (BACTRIM DS) 800-160 MG tablet Take 1 tablet by mouth 2 (two) times daily. 05/07/20   Florian Buff, MD  ziprasidone (GEODON) 40 MG capsule Take by mouth daily as needed. 10/27/20   [provider]    Allergies    Bee venom, Ace inhibitors, Penicillin g, and Penicillins  Review of Systems   Review of Systems  Constitutional:  Negative for chills and fever.  HENT:  Negative for congestion.   Respiratory:  Negative for shortness of breath.   Cardiovascular:  Negative for chest pain.  Gastrointestinal:  Negative for abdominal pain.  Genitourinary:  Negative for enuresis.  Musculoskeletal:  Negative for back pain.  Skin:  Negative for rash.  Neurological:  Positive for  weakness and numbness. Negative for dizziness.  Hematological:  Does not bruise/bleed easily.   Physical Exam Updated Vital Signs BP (!) 196/61 (BP Location: Left Arm)   Pulse 70   Temp 98.9 F (37.2 C)   Resp (!) 22   Ht '5\' 4"'  (1.626 m)   Wt 93.2 kg   SpO2 99%   BMI 35.27 kg/m   Physical Exam Vitals and nursing note reviewed.  Constitutional:      General: She is not in acute distress.    Appearance: She is not ill-appearing.  HENT:     Head: Normocephalic and atraumatic.     Nose: No congestion.     Mouth/Throat:     Mouth: Mucous membranes are moist.     Pharynx: Oropharynx is clear.  Eyes:     Extraocular Movements: Extraocular movements intact.     Conjunctiva/sclera: Conjunctivae normal.     Pupils: Pupils are equal, round, and reactive to light.  Cardiovascular:     Rate and Rhythm: Normal rate and regular rhythm.     Pulses: Normal pulses.     Heart sounds: No murmur heard.   No friction rub. No gallop.  Pulmonary:     Effort: No respiratory distress.     Breath sounds: No wheezing, rhonchi or rales.  Abdominal:     Palpations: Abdomen is soft.     Tenderness: There is no abdominal tenderness. There is no right CVA tenderness or left CVA tenderness.  Musculoskeletal:     Cervical back: Neck supple.     Comments: Patient has full range of motion, 5 of 5 strength in upper lower extremities.  Skin:    General: Skin is warm and dry.  Neurological:     Mental Status: She is alert.     GCS: GCS eye subscore is 4. GCS verbal subscore is 5. GCS motor subscore is 6.     Sensory: Sensation is intact.     Motor: No weakness.     Coordination: Romberg sign negative. Finger-Nose-Finger Test normal.     Gait: Gait is intact.     Comments: Cranial nerves II through XII are grossly intact, able to follow two-step commands, no difficulty with word finding, no slurring of her words, no unilateral weakness present, gait fully intact.  Psychiatric:        Mood and Affect:  Mood normal.    ED Results / Procedures / Treatments  Labs (all labs ordered are listed, but only abnormal results are displayed) Labs Reviewed  COMPREHENSIVE METABOLIC PANEL - Abnormal; Notable for the following components:      Result Value   Potassium 3.4 (*)    Glucose, Bld 237 (*)    BUN 48 (*)    Creatinine, Ser 1.97 (*)    Calcium 8.4 (*)    AST 14 (*)    GFR, Estimated 26 (*)    All other components within normal limits  CBC WITH DIFFERENTIAL/PLATELET - Abnormal; Notable for the following components:   Hemoglobin 11.2 (*)    HCT 35.3 (*)    RDW 16.0 (*)    All other components within normal limits  RESP PANEL BY RT-PCR (FLU A&B, COVID) [id number]  PROTIME-INR    EKG None  Radiology No results found.  Procedures Procedures   Medications Ordered in ED Medications - No data to display  ED Course  I have reviewed the triage vital signs and the nursing notes.  Pertinent labs & imaging results that were available during my care of the patient were reviewed by me and considered in my medical decision making (see chart for details).    MDM Rules/Calculators/A&P                          Initial impression-patient presents with unilateral weakness/numbness has since resolved.  She is alert, does not appear acute stress, vital signs are for hypertension 196/61.  Unclear etiology I am concerned for possible TIA will obtain basic lab work-up, obtain CT head and reassess.  Work-up-CBC shows normocytic anemia hemoglobin 11.2, CMP shows hypokalemia 3.4, glucose 237, BUN 48, creatinine 1.97 decreased from her baseline, GFR 26, prothrombin time INR unremarkable  Reassessment-due to patient's presentation concern for possible TIA she has a elevated ABCD 2 score for TIAs 5 will consult neuro for further recommendations.  Updated patient recommendations from neurology she is agreement this plan will transfer patient ED to ED for further evaluation.  Consult-spoke with Dr.  Malen Gauze who would like the patient transferred ED to ED as she is at high risk for TIA and since we do not have a CT scan at this time.  Spoke with Dr. Francia Greaves who is excepted the patient in transfer  Rule out-I have low suspicion for intracranial head bleed and or mass as patient has any head trauma, is not on anticoagulant, patient had CT head approxi-1 week ago which was unremarkable.  I have low suspicion for CVA at this time as she has no focal deficits present.  I have low suspicion for AAA or dissection of the vertebral/carotid arteries as presentation is atypical of etiology.  Low suspicion for electrolyte abnormality as there is no derailments present on c-Met.  Plan-transfer ED to ED for TIA work-up. Final Clinical Impression(s) / ED Diagnoses Final diagnoses:  Paresthesias  Weakness    Rx / DC Orders ED Discharge Orders     None        Marcello Fennel, PA-C 12/08/20 1746    Milton Ferguson, MD 12/08/20 2016

## 2020-12-08 NOTE — ED Notes (Signed)
Patient transported to CT 

## 2020-12-08 NOTE — ED Triage Notes (Signed)
Pt to the ED with complaints of right arm and leg numbness. Pt was here for the same 9/29 according to her husband.  Pt states the numbness/dead feeling began around noon today.  Pt walked unassisted to the ED from the vehicle. Pt shows no signs of weakness/drift or facial droop during triage. A & O x4.

## 2020-12-08 NOTE — Plan of Care (Signed)
Resolved unilateral numbness, weakness, incoming with BP 190s. ABCD2 - 5 Needs TIA w/u CT scanner not working at [facility]. Recommend ER to ER transfer to [facility]. Head CT at minimum, followed by call to inpatient neurology for consult for TIA. Will need MRI and rest of the stroke w/u there. -- Amie Portland, MD Neurologist [facility] Pager: [phone]

## 2020-12-08 NOTE — ED Notes (Signed)
Pt transferred from [facility] complaining of right arm and leg numbness that had previously started at 1200. Her symptoms have since resolved. Patient was transferred to [facility] for a head CT, and neuro consult. Patient remains hypertensive and is A&Ox4.  VSS stable with [facility] except BP 171/80.

## 2020-12-08 NOTE — H&P (Signed)
History and Physical    PLEASE NOTE THAT DRAGON DICT[employer] SOFTWARE WAS USED IN THE CONSTRUCTION OF THIS NOTE.   Holly Baldwin:[id number] DOB: 1946-02-16 DOA: 12/08/2020  PCP: Sharilyn Sites, MD Patient coming from: home   I have personally briefly reviewed patient's old medical records in [place]  Chief Complaint: Right-sided numbness  HPI: Holly Baldwin is a 75 y.o. female with medical history significant for type 2 diabetes mellitus, hypertension, hyperlipidemia, chronic diastolic heart failure, stage IV chronic kidney disease with baseline creatinine 1.9-2.3, who is admitted to [facility] on 12/08/2020 with suspected TIA after presenting from [place] ED to [facility] ED via ED to ED transfer after presenting to the former complaining of right-sided numbness.   The patient reports sudden onset of numbness and paresthesias in the right upper and lower extremities associated with right lower extremity weakness while starting at 1300 on 12/08/2020.  The patient reports that the symptoms were self-limited, lasting approximately 1 hour before spontaneously resolving without recurrence before she arrived at [facility] emergency department for further evaluation.  Otherwise, she denies any associated dysphagia, dizziness, vertigo, nausea, vomiting, change in vision, blurry vision, diplopia, word finding difficulty as well as her speech, facial droop, or headache.  She also denies any recent or associated chest pain, shortness of breath, palpitations, diaphoresis, presyncope, or syncope.  The patient acknowledges a history of hypertension, hyperlipidemia, type 2 diabetes mellitus.  Regarding the latter, she notes good compliance with her outpatient basal and short acting insulin as well as glipizide.  Most recent prior hemoglobin A1c found to be 6.7% on 11/12/2020.  In the setting of hyperlipidemia, she conveys good compliance with home simvastatin 40 mg p.o. daily.  We will she is also noted to be on  several antihypertensive medications at home, including nifedipine, lisinopril, Imdur, Coreg, and hydralazine.  She is a lifelong non-smoker, and denies any known history of underlying atrial fibrillation or obstructive sleep apnea.  She is currently on a daily baby aspirin at home, but otherwise denies any use of additional blood thinning agents as an outpatient.  Of note, most recent echocardiogram was performed in June 2020 and was notable for normal left ventricular cavity size, LVEF 60 to 95%, diastolic parameters consistent with pseudonormalization, severely dilated bilateral atria, no evidence of significant valvular pathology.  Denies any recent subjective fever, chills, rigors, or generalized myalgias.  No recent neck stiffness, shortness of breath, wheezing, rash.  No recent known COVID-19 exposures.  As [facility] did not currently have CT of my services available, she underwent ED to ED transfer to [facility] for further evaluation and management of suspected presenting TIA, including pursuit of CT/MRI.     [facility] ED Course:  Vital signs in the ED were notable for the following: Afebrile, heart rate 60-66 will: Blood pressure 177/67 -196/61; respiratory rate 18-22, oxygen saturation 99 to 100% on room air.  Labs were notable for the following: CMP notable for the following: Sodium 140, potassium 3.4, bicarbonate 24, anion gap 8, creatinine 1.97 relative to most recent prior value of 2.26 on 11/29/2020, glucose 237, liver enzymes were found to be within normal limits.  CBC notable for white cell count 6 700, hemoglobin 11.2 compared to 11.3 on 11/29/2020.  INR 1.0.  COVID-19 PCR performed in the ED today was found to be negative.  Imaging and additional notable ED work-up: EKG, in comparison to most recent prior from 11/30/2020 shows sinus rhythm with PAC, heart rate 62, PR  of 225, unchanged T wave flattening in leads I and aVL, and no evidence of ST changes, including no evidence of ST elevation.  CT  head showed no evidence of acute intracranial process, including no evidence of intracranial hemorrhage.  MRI brain showed no evidence of acute intracranial process, including no evidence of acute infarct.  the patient's case and imaging were discussed with the on-call neurologist, Dr Cheral Marker, who will formally consult and has evaluated the patient. Dr Cheral Marker recommends admission to the Hospitalist service for overnight observation further evaluation and management of suspected TIA.   While in the ED, the following were administered: Ativan 1 mg IV x1 prior to MRI brain.     Review of Systems: As per HPI otherwise 10 point review of systems negative.   Past Medical History:  Diagnosis Date   Anxiety    Chronic abdominal pain    Diabetes mellitus    GERD (gastroesophageal reflux disease)    Hiatal hernia    Hypercholesteremia    Hypertension     Past Surgical History:  Procedure Laterality Date   ABDOMINAL HYSTERECTOMY     CATARACT EXTRACTION W/PHACO Right 01/09/2020   Procedure: CATARACT EXTRACTION PHACO AND INTRAOCULAR LENS PLACEMENT ([place]);  Surgeon: Baruch Goldmann, MD;  Location: [facility] ORS;  Service: Ophthalmology;  Laterality: Right;  CDE: 12.01   CATARACT EXTRACTION W/PHACO Left 01/23/2020   Procedure: CATARACT EXTRACTION PHACO AND INTRAOCULAR LENS PLACEMENT LEFT EYE;  Surgeon: Baruch Goldmann, MD;  Location: [facility] ORS;  Service: Ophthalmology;  Laterality: Left;  CDE 8.71   COLONOSCOPY N/A 06/23/2012   Procedure: COLONOSCOPY;  Surgeon: Rogene Houston, MD;  Location: [facility] ENDO SUITE;  Service: Endoscopy;  Laterality: N/A;  100-moved to 1200 Ann to notify pt   ESOPHAGOGASTRODUODENOSCOPY N/A 05/06/2012   Procedure: ESOPHAGOGASTRODUODENOSCOPY (EGD);  Surgeon: Rogene Houston, MD;  Location: [facility] ENDO SUITE;  Service: Endoscopy;  Laterality: N/A;   MASS EXCISION Right 05/26/2012   Procedure: EXCISION NEOPLASM RIGHT THIGH;  Surgeon: Jamesetta So, MD;  Location: [facility] ORS;  Service: General;   Laterality: Right;    Social History:  reports that she has never smoked. She has never used smokeless tobacco. She reports that she does not drink alcohol and does not use drugs.   Allergies  Allergen Reactions   Bee Venom Anaphylaxis   Ace Inhibitors    Penicillin G    Penicillins Hives    .Has patient had a PCN reaction causing immediate rash, facial/tongue/throat swelling, SOB or lightheadedness with hypotension: Yes Has patient had a PCN reaction causing severe rash involving mucus membranes or skin necrosis: No Has patient had a PCN reaction that required hospitalization: Yes Has patient had a PCN reaction occurring within the last 10 years: No If all of the above answers are "NO", then may proceed with Cephalosporin use.     Family History  Problem Relation Age of Onset   Diabetes Mother    Diabetes Father    Stroke Father    Diabetes Sister    Diabetes Brother    Colon cancer Neg Hx    Colon polyps Neg Hx     Family history reviewed and not pertinent    Prior to Admission medications   Medication Sig Start Date End Date Taking? Authorizing Provider  calcitRIOL (ROCALTROL) 0.25 MCG capsule TAKE ONE CAPSULE ON MONDAY, WEDNESDAY AND FRIDAY. 11/28/20  Yes [provider]  albuterol (PROVENTIL HFA;VENTOLIN HFA) 108 (90 Base) MCG/ACT inhaler Inhale 1-2 puffs into the lungs every  6 (six) hours as needed for wheezing or shortness of breath.     [provider]  aspirin EC 81 MG tablet Take 81 mg by mouth at bedtime.    [provider]  benzonatate (TESSALON) 200 MG capsule Take 200 mg by mouth 3 (three) times daily as needed. 10/01/20   [provider]  Blood Glucose Monitoring Suppl (ACCU-CHEK AVIVA PLUS) w/Device KIT  03/09/20   [provider]  carvedilol (COREG) 25 MG tablet  02/27/20   [provider]  clotrimazole-betamethasone (LOTRISONE) cream Apply topically 2 (two) times daily. 04/06/20   [provider]   EPINEPHrine (EPI-PEN) 0.3 mg/0.3 mL DEVI Inject 0.3 mg into the muscle once.    [provider]  furosemide (LASIX) 40 MG tablet Take 40 mg by mouth 2 (two) times daily. 08/30/20   [provider]  gabapentin (NEURONTIN) 100 MG capsule TAKE 1 CAPSULE TWICE DAILY 12/31/19   Nida, Marella Chimes, MD  glipiZIDE (GLUCOTROL) 5 MG tablet Take by mouth daily before breakfast.    [provider]  glucose blood (ACCU-CHEK AVIVA PLUS) test strip TEST BLOOD GLUCOSE FOUR TIMES DAILY 05/02/20   Cassandria Anger, MD  hydrALAZINE (APRESOLINE) 100 MG tablet Take 1 tablet (100 mg total) by mouth 3 (three) times daily. 08/25/18   Barton Dubois, MD  insulin lispro (HUMALOG) 100 UNIT/ML KwikPen Inject 10 Units into the skin 3 (three) times daily. With meals 04/04/20   Brita Romp, NP  isosorbide mononitrate (IMDUR) 60 MG 24 hr tablet Take 1 tablet (60 mg total) by mouth daily. 08/26/18   Barton Dubois, MD  LANTUS SOLOSTAR 100 UNIT/ML Solostar Pen INJECT 40 UNITS SUBCUTANEOUSLY AT 10PM. 09/10/20   Cassandria Anger, MD  levothyroxine (SYNTHROID) 137 MCG tablet TAKE 1 TABLET (137 MCG TOTAL) BY MOUTH DAILY BEFORE BREAKFAST. 12/20/19   Cassandria Anger, MD  lisinopril (ZESTRIL) 5 MG tablet Take 5 mg by mouth daily. 03/21/20   [provider]  moxifloxacin (VIGAMOX) 0.5 % ophthalmic solution Apply to eye. 12/16/19   [provider]  Multiple Vitamin (MULTIVITAMIN WITH MINERALS) TABS tablet Take 1 tablet by mouth daily.    [provider]  NIFEdipine (PROCARDIA XL/NIFEDICAL XL) 60 MG 24 hr tablet Take 60 mg by mouth daily. 12/19/19   [provider]  silver sulfADIAZINE (SILVADENE) 1 % cream Apply to area on the breast  3 times daily 05/07/20   Florian Buff, MD  simvastatin (ZOCOR) 40 MG tablet TAKE 1 TABLET EVERY DAY Patient taking differently: Take 40 mg by mouth daily. 10/05/17   Fayrene Helper, MD  sulfamethoxazole-trimethoprim (BACTRIM  DS) 800-160 MG tablet Take 1 tablet by mouth 2 (two) times daily. 05/07/20   Florian Buff, MD  ziprasidone (GEODON) 40 MG capsule Take by mouth daily as needed. 10/27/20   [provider]     Objective    Physical Exam: Vitals:   12/08/20 1855 12/08/20 1900 12/08/20 1915 12/08/20 2103  BP:  (!) 179/74 (!) 177/67 (!) 189/67  Pulse: 69 60 (!) 59 65  Resp: '18 20 18 13  ' Temp:      TempSrc:      SpO2: 100% 100% 100% 100%  Weight:      Height:        General: appears to be stated age; alert, oriented Skin: warm, dry, no rash Head:  AT/[place] Mouth:  Oral mucosa membranes appear moist, normal dentition Neck: supple; trachea midline Heart:  RRR;  did not appreciate any M/R/G Lungs: CTAB, did not appreciate any wheezes, rales, or rhonchi Abdomen: + BS; soft, ND, NT Vascular: 2+ pedal pulses b/l; 2+ radial pulses b/l Extremities: no peripheral edema, no muscle wasting Neuro: 5/5 strength of the proximal and distal flexors and extensors of the upper and lower extremities bilaterally; sensation intact in upper and lower extremities b/l; cranial nerves II through XII grossly intact; no pronator drift; no evidence suggestive of slurred speech, dysarthria, or facial droop; Normal muscle tone. No tremors.    Labs on Admission: I have personally reviewed following labs and imaging studies  CBC: Recent Labs  Lab 12/08/20 1550  WBC 6.7  NEUTROABS 4.5  HGB 11.2*  HCT 35.3*  MCV 84.2  PLT 027   Basic Metabolic Panel: Recent Labs  Lab 12/08/20 1550  NA 140  K 3.4*  CL 108  CO2 24  GLUCOSE 237*  BUN 48*  CREATININE 1.97*  CALCIUM 8.4*   GFR: Estimated Creatinine Clearance: 26.9 mL/min (A) (by C-G formula based on SCr of 1.97 mg/dL (H)). Liver Function Tests: Recent Labs  Lab 12/08/20 1550  AST 14*  ALT 13  ALKPHOS 117  BILITOT 0.9  PROT 6.7  ALBUMIN 3.5   No results for input(s): LIPASE, AMYLASE in the last 168 hours. No results for input(s): AMMONIA in the last  168 hours. Coagulation Profile: Recent Labs  Lab 12/08/20 1550  INR 1.0   Cardiac Enzymes: No results for input(s): CKTOTAL, CKMB, CKMBINDEX, TROPONINI in the last 168 hours. BNP (last 3 results) No results for input(s): PROBNP in the last 8760 hours. HbA1C: No results for input(s): HGBA1C in the last 72 hours. CBG: No results for input(s): GLUCAP in the last 168 hours. Lipid Profile: No results for input(s): CHOL, HDL, LDLCALC, TRIG, CHOLHDL, LDLDIRECT in the last 72 hours. Thyroid Function Tests: No results for input(s): TSH, T4TOTAL, FREET4, T3FREE, THYROIDAB in the last 72 hours. Anemia Panel: No results for input(s): VITAMINB12, FOLATE, FERRITIN, TIBC, IRON, RETICCTPCT in the last 72 hours. Urine analysis:    Component Value Date/Time   COLORURINE STRAW (A) 11/29/2020 1820   APPEARANCEUR CLEAR 11/29/2020 1820   LABSPEC 1.005 11/29/2020 1820   PHURINE 6.0 11/29/2020 1820   GLUCOSEU NEGATIVE 11/29/2020 1820   HGBUR NEGATIVE 11/29/2020 1820   BILIRUBINUR NEGATIVE 11/29/2020 1820   KETONESUR NEGATIVE 11/29/2020 1820   PROTEINUR >=300 (A) 11/29/2020 1820   UROBILINOGEN 1.0 10/10/2014 1120   NITRITE NEGATIVE 11/29/2020 1820   LEUKOCYTESUR NEGATIVE 11/29/2020 1820    Radiological Exams on Admission: CT Head Wo Contrast  Result Date: 12/08/2020 CLINICAL DATA:  Neurologic deficit EXAM: CT HEAD WITHOUT CONTRAST TECHNIQUE: Contiguous axial images were obtained from the base of the skull through the vertex without intravenous contrast. COMPARISON:  11/29/2020 FINDINGS: Brain: Stable small-vessel ischemic changes throughout the periventricular white matter and left basal ganglia. No acute infarct or hemorrhage. Lateral ventricles and remaining midline structures are unremarkable. No acute extra-axial fluid collections. No mass effect. Vascular: No hyperdense vessel or unexpected calcification. Skull: Normal. Negative for fracture or focal lesion. Sinuses/Orbits: No acute finding.  Other: None. IMPRESSION: 1. Stable head CT, no acute process. Electronically Signed   By: Randa Ngo M.D.   On: 12/08/2020 19:03   MR BRAIN WO CONTRAST  Result Date: 12/08/2020 CLINICAL DATA:  Acute neurologic deficit EXAM: MRI HEAD WITHOUT CONTRAST TECHNIQUE: Multiplanar, multiecho pulse sequences of the brain and surrounding structures were obtained without intravenous contrast. COMPARISON:  None. FINDINGS: Brain: No  acute infarct, mass effect or extra-axial collection. No acute or chronic hemorrhage. Hyperintense T2-weighted signal is moderately widespread throughout the white matter. Generalized volume loss without a clear lobar predilection. 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 Vascular: Major flow voids are preserved. Skull and upper cervical spine: Normal calvarium and skull base. Visualized upper cervical spine and soft tissues are normal. Sinuses/Orbits:No paranasal sinus fluid levels or advanced mucosal thickening. No mastoid or middle ear effusion. Normal orbits. IMPRESSION: 1. No acute intracranial abnormality. 2. Generalized volume loss and findings of chronic ischemic microangiopathy. Electronically Signed   By: Ulyses Jarred M.D.   On: 12/08/2020 21:24     EKG: Independently reviewed, with result as described above.    Assessment/Plan    Holly Baldwin is a 74 y.o. female with medical history significant for type 2 diabetes mellitus, hypertension, hyperlipidemia, chronic diastolic heart failure, stage IV chronic kidney disease with baseline creatinine 1.9-2.3, who is admitted to [facility] on 12/08/2020 with suspected TIA after presenting from [place] ED to [facility] ED via ED to ED transfer after presenting to the former complaining of right-sided numbness.    Principal Problem:   TIA (transient ischemic attack) Active Problems:   DM2 (diabetes mellitus, type 2) ([place])   Primary hypothyroidism   Hypokalemia   Hypercholesteremia   Hypertension    Chronic diastolic CHF (congestive heart failure) ([place])      #) TIA: suspected dx on the basis of acute onset of right-sided numbness/paresthesias as well as right lower extremity weakness at 1300 on 12/08/2020, for complete spontaneous resolution within 1 hour of onset, without any additional associated or residual acute focal neurologic deficits, with presenting CT head showed no evidence of acute intracranial process, while MRI brain also showed no acute intracranial process, including no evidence of acute ischemic infarct  the patient's case and imaging were discussed with the on-call neurologist, Dr Cheral Marker, who will formally consult and has evaluated the patient. Dr Cheral Marker recommends admission to the Hospitalist service for overnight observation further evaluation and management of suspected TIA, including further evaluation of potential modifiable acute ischemic CVA risk factors, as described below. Will also monitor closely on telemetry to evaluate for the presence of previously undiagnosed atrial fibrillation.   Of note, the patient reportedly possesses multiple modifiable CVA risk factors including a history of type 2 diabetes mellitus, hypertension, hyperlipidemia, while denying any known history of atrial fibrillation, obstructive sleep apnea, and reporting that she is a lifelong non-smoker.   Not a tPA candidate given complete spontaneous resolution of her acute focal neurologic deficits prior to arriving in the emergency department, without any residual objective acute focal neurologic deficits noted. Current outpatient antiplatelet/anticoagulant regimen: Daily baby aspirin.  Current outpatient antilipid regimen: Simvastatin 40 mg p.o. nightly.   Will allow for permissive hypertension for 24 hours following onset of acute focal neurologic deficits, with associated parameters further quantified below, and with period of observance of permissive hypertension to end at 1300 on 12/09/2020.       Plan: Nursing bedside swallow evaluation x 1 now, and will not initiate oral medications or diet until the patient has passed this. Head of the bed at 30 degrees. Neuro checks per protocol. VS per protocol. Will allow for permissive hypertension for 24 hours following onset of acute focal neurologic deficits, during which will hold home antihypertensive medications, with prn IV hydralazine ordered for systolic blood pressure greater than 268 mmHg or diastolic blood pressure greater than  110 mmHg until 1300 on 12/09/20.   Monitor on telemetry, including monitoring for atrial fibrillation as modifiable risk factor for acute ischemic CVA.    Bilateral carotid US. TTE w/o bubble given the patient's age has been ordered for the morning t check lipid panel and A1c. PT/OT consults have been ordered to occur in the morning.  Neurology consulted, as above.  For now continue home aspirin and statin.     #) Hypokalemia: Presenting serum potassium found to be 3.4.  Plan: In the setting of stage IV chronic kidney disease, 5 conservative oral supplementation of this mildly low value BM potassium chloride 20 equivalents p.o. x1 dose now.  Add on serum magnesium level.  Monitor on telemetry.  Repeat CMP in the morning.     #) Type 2 Diabetes Mellitus: Most recent A1c noted to be 6.7% on 11/12/2020. Home insulin regimen: Lantus 4 units subcu nightly as well as Humalog 10 units 3 times daily with meals.. Home oral hypoglycemic agents: Glipizide. presenting blood sugar 237 appears to be complicated by diabetic peripheral polyneuropathy, on gabapentin as an outpatient..    Plan: accuchecks QAC and HS with moderate dose sliding scale insulin.  Will initiate Basal Insulin in the Form of Lantus 20 Units Subcu Nightly, with Next Dose to Occur Now.  Hold Home Glipizide during His Hospitalization.  Will Check Hemoglobin A1c As Component of Work-Up and Management of Presenting TIA, As above.      #) Essential  hypertension: Outpatient hypertensive regimen includes hydralazine, Coreg, indoor, lisinopril, nifedipine.  Suspect hydration patient's stage IV chronic kidney disease.  In setting of presenting TIA, will observe 24 hours of permissive hypertension, as further outlined above.  Plan: Hold home antihypertensive medications for now in the setting of observance of permissive hypertension for 24 hours, as further detailed above.  Close monitoring of ensuing blood pressures via routine vital signs.  Monitor strict I's and O's Daily weights.       #) Acquired hypothyroidism: On Synthroid as an outpatient.  Plan: Continue Synthroid.     #) Chronic diastolic heart failure: documented history of such, with most recent echocardiogram performed in 2020, as detailed above. No clinical evidence to suggest acutely decompensated heart failure at this time. Patient's home diuretic regimen reportedly consists of the following: Lasix 40 mg p.o. twice daily.  Presenting EKG shows sinus rhythm without evidence of acute ischemic changes..   Plan: monitor strict I's & O's and daily weights. Repeat BMP in the morning. Check serum magnesium level. Continue home diuretic regimen.  Add on serum magnesium level.      #) Stage IV chronic kidney disease: Associated with baseline creatinine range of 1.9-2.3, with presenting labs reflecting serum creatinine consistent with his baseline range.  She is on calcitriol as an outpatient.   Monitor strict I's and O's Daily weights.  Tempt avoid nephrotoxic agents.  Repeat CMP in the morning.  Check serum magnesium and phosphorus levels.     DVT prophylaxis: SCDs Code Status: Full code Family Communication: none Disposition Plan: Per Rounding Team Consults called: on-call neurology, Dr.Lindzen consulted, as further described above;  Admission status: Observation; med telemetry   Of note, this patient was added by me to the following Admit List/Treatment Team:   mcadmits.   Of note, the Adult Admission Order Set (Multimorbid order set) was used by me in the admission process for this patient.  PLEASE NOTE THAT DRAGON DICTATION SOFTWARE WAS USED IN THE CONSTRUCTION OF THIS NOTE.  Rhetta Mura DO [facility] Pager [phone] From [place]   12/08/2020, 10:06 PM

## 2020-12-08 NOTE — ED Provider Notes (Signed)
Pt transferred to the ED today from [facility] for a TIA work up due to arm and leg numbness.  Unfortunately, [facility]'s CT scanner is broken.  Pt said she feels much better now.  She is hypertensive and said she's been having a hard time controlling her bp.  CT and MRI nl.  Pt d/w Dr. Cheral Marker (neurology) who recommends admission for TIA work up.  Pt d/w Dr. Velia Meyer ([facility]) for admission.   Isla Pence, MD 12/08/20 2158

## 2020-12-08 NOTE — ED Notes (Signed)
Pt returned from MRI and placed back on monitor.

## 2020-12-08 NOTE — ED Notes (Signed)
Pt ambulatory to bathroom unassisted.  Denies dizziness upon standing.

## 2020-12-09 ENCOUNTER — Observation Stay (HOSPITAL_COMMUNITY): Payer: Medicare Other

## 2020-12-09 ENCOUNTER — Observation Stay (HOSPITAL_BASED_OUTPATIENT_CLINIC_OR_DEPARTMENT_OTHER): Payer: Medicare Other

## 2020-12-09 DIAGNOSIS — G459 Transient cerebral ischemic attack, unspecified: Secondary | ICD-10-CM

## 2020-12-09 DIAGNOSIS — I639 Cerebral infarction, unspecified: Secondary | ICD-10-CM | POA: Diagnosis not present

## 2020-12-09 DIAGNOSIS — E119 Type 2 diabetes mellitus without complications: Secondary | ICD-10-CM | POA: Diagnosis not present

## 2020-12-09 DIAGNOSIS — I10 Essential (primary) hypertension: Secondary | ICD-10-CM | POA: Diagnosis not present

## 2020-12-09 DIAGNOSIS — Z79899 Other long term (current) drug therapy: Secondary | ICD-10-CM | POA: Diagnosis not present

## 2020-12-09 DIAGNOSIS — I6622 Occlusion and stenosis of left posterior cerebral artery: Secondary | ICD-10-CM | POA: Diagnosis not present

## 2020-12-09 DIAGNOSIS — Z794 Long term (current) use of insulin: Secondary | ICD-10-CM | POA: Diagnosis not present

## 2020-12-09 DIAGNOSIS — R29818 Other symptoms and signs involving the nervous system: Secondary | ICD-10-CM | POA: Diagnosis not present

## 2020-12-09 DIAGNOSIS — Z7982 Long term (current) use of aspirin: Secondary | ICD-10-CM | POA: Diagnosis not present

## 2020-12-09 DIAGNOSIS — Z20822 Contact with and (suspected) exposure to covid-19: Secondary | ICD-10-CM | POA: Diagnosis not present

## 2020-12-09 LAB — COMPREHENSIVE METABOLIC PANEL
ALT: 12 U/L (ref 0–44)
AST: 13 U/L — ABNORMAL LOW (ref 15–41)
Albumin: 3.1 g/dL — ABNORMAL LOW (ref 3.5–5.0)
Alkaline Phosphatase: 105 U/L (ref 38–126)
Anion gap: 8 (ref 5–15)
BUN: 38 mg/dL — ABNORMAL HIGH (ref 8–23)
CO2: 24 mmol/L (ref 22–32)
Calcium: 8.6 mg/dL — ABNORMAL LOW (ref 8.9–10.3)
Chloride: 107 mmol/L (ref 98–111)
Creatinine, Ser: 1.96 mg/dL — ABNORMAL HIGH (ref 0.44–1.00)
GFR, Estimated: 26 mL/min — ABNORMAL LOW (ref >60)
Glucose, Bld: 208 mg/dL — ABNORMAL HIGH (ref 70–99)
Potassium: 3.5 mmol/L (ref 3.5–5.1)
Sodium: 139 mmol/L (ref 135–145)
Total Bilirubin: 0.5 mg/dL (ref 0.3–1.2)
Total Protein: 6.3 g/dL — ABNORMAL LOW (ref 6.5–8.1)

## 2020-12-09 LAB — HEMOGLOBIN A1C
Hgb A1c MFr Bld: 6.8 % — ABNORMAL HIGH (ref 4.8–5.6)
Mean Plasma Glucose: 148.46 mg/dL

## 2020-12-09 LAB — CBC
HCT: 33.1 % — ABNORMAL LOW (ref 36.0–46.0)
Hemoglobin: 10.3 g/dL — ABNORMAL LOW (ref 12.0–15.0)
MCH: 25.7 pg — ABNORMAL LOW (ref 26.0–34.0)
MCHC: 31.1 g/dL (ref 30.0–36.0)
MCV: 82.5 fL (ref 80.0–100.0)
Platelets: 230 10*3/uL (ref 150–400)
RBC: 4.01 MIL/uL (ref 3.87–5.11)
RDW: 15.9 % — ABNORMAL HIGH (ref 11.5–15.5)
WBC: 7.1 10*3/uL (ref 4.0–10.5)
nRBC: 0 % (ref 0.0–0.2)

## 2020-12-09 LAB — ECHOCARDIOGRAM COMPLETE
AR max vel: 2.59 cm2
AV Area VTI: 2.72 cm2
AV Area mean vel: 2.59 cm2
AV Mean grad: 4 mmHg
AV Peak grad: 7 mmHg
Ao pk vel: 1.32 m/s
Area-P 1/2: 4.71 cm2
Calc EF: 62.1 %
Height: 64 in
MV M vel: 3.21 m/s
MV Peak grad: 41.2 mmHg
Single Plane A2C EF: 57.1 %
Single Plane A4C EF: 62.5 %
Weight: 3365.1 oz

## 2020-12-09 LAB — LIPID PANEL
Cholesterol: 148 mg/dL (ref 0–200)
HDL: 35 mg/dL — ABNORMAL LOW (ref >40)
LDL Cholesterol: 79 mg/dL (ref 0–99)
Total CHOL/HDL Ratio: 4.2 RATIO
Triglycerides: 170 mg/dL — ABNORMAL HIGH (ref <150)
VLDL: 34 mg/dL (ref 0–40)

## 2020-12-09 LAB — GLUCOSE, CAPILLARY
Glucose-Capillary: 152 mg/dL — ABNORMAL HIGH (ref 70–99)
Glucose-Capillary: 166 mg/dL — ABNORMAL HIGH (ref 70–99)
Glucose-Capillary: 208 mg/dL — ABNORMAL HIGH (ref 70–99)

## 2020-12-09 LAB — MAGNESIUM: Magnesium: 2 mg/dL (ref 1.7–2.4)

## 2020-12-09 MED ORDER — POLYETHYLENE GLYCOL 3350 17 G PO PACK
17.0000 g | PACK | Freq: Once | ORAL | Status: AC
  Administered 2020-12-09: 17 g via ORAL
  Filled 2020-12-09: qty 1

## 2020-12-09 MED ORDER — SENNA 8.6 MG PO TABS
1.0000 | ORAL_TABLET | Freq: Once | ORAL | Status: AC
  Administered 2020-12-09: 8.6 mg via ORAL
  Filled 2020-12-09: qty 1

## 2020-12-09 MED ORDER — CLOPIDOGREL BISULFATE 75 MG PO TABS: 75.0000 mg | ORAL_TABLET | Freq: Every day | ORAL | 0 refills | Status: DC

## 2020-12-09 NOTE — Progress Notes (Addendum)
STROKE TEAM PROGRESS NOTE   INTERVAL HISTORY Today she reports continued resolution of her presenting symptoms of right arm and leg numbness. She is feeling back to normal. She also denies any new symptoms of concern today. She reports medication compliance at home. Denies previous similar episodes or history of stroke/TIA. Bedside cardiac monitor alarming afib during our visit but rhythm not convincing for a fib on monitor. EKG ordered.  We discussed her possible TIA diagnosis, work up and plan of care. Her questions were answered.   Vitals:   12/08/20 2300 12/09/20 0300 12/09/20 0556 12/09/20 0802  BP: (!) 153/55 (!) 147/57  (!) 153/50  Pulse: 81 66  (!) 59  Resp: 19 18  15   Temp:  98.4 F (36.9 C)  98.9 F (37.2 C)  TempSrc:  Oral  Oral  SpO2: 99% 98%  97%  Weight:   95.4 kg   Height:       CBC:  Recent Labs  Lab 12/08/20 1550 12/09/20 0432  WBC 6.7 7.1  NEUTROABS 4.5  --   HGB 11.2* 10.3*  HCT 35.3* 33.1*  MCV 84.2 82.5  PLT 266 379   Basic Metabolic Panel:  Recent Labs  Lab 12/08/20 1550 12/09/20 0432  NA 140 139  K 3.4* 3.5  CL 108 107  CO2 24 24  GLUCOSE 237* 208*  BUN 48* 38*  CREATININE 1.97* 1.96*  CALCIUM 8.4* 8.6*  MG  --  2.0   Lipid Panel:  Recent Labs  Lab 12/09/20 0432  CHOL 148  TRIG 170*  HDL 35*  CHOLHDL 4.2  VLDL 34  LDLCALC 79   HgbA1c:  Recent Labs  Lab 12/09/20 0432  HGBA1C 6.8*   Urine Drug Screen: No results for input(s): LABOPIA, COCAINSCRNUR, LABBENZ, AMPHETMU, THCU, LABBARB in the last 168 hours.  Alcohol Level No results for input(s): ETH in the last 168 hours.  IMAGING past 24 hours CT Head Wo Contrast  Result Date: 12/08/2020 CLINICAL DATA:  Neurologic deficit EXAM: CT HEAD WITHOUT CONTRAST TECHNIQUE: Contiguous axial images were obtained from the base of the skull through the vertex without intravenous contrast. COMPARISON:  11/29/2020 FINDINGS: Brain: Stable small-vessel ischemic changes throughout the  periventricular white matter and left basal ganglia. No acute infarct or hemorrhage. Lateral ventricles and remaining midline structures are unremarkable. No acute extra-axial fluid collections. No mass effect. Vascular: No hyperdense vessel or unexpected calcification. Skull: Normal. Negative for fracture or focal lesion. Sinuses/Orbits: No acute finding. Other: None. IMPRESSION: 1. Stable head CT, no acute process. Electronically Signed   By: Randa Ngo M.D.   On: 12/08/2020 19:03   MR BRAIN WO CONTRAST  Result Date: 12/08/2020 CLINICAL DATA:  Acute neurologic deficit EXAM: MRI HEAD WITHOUT CONTRAST TECHNIQUE: Multiplanar, multiecho pulse sequences of the brain and surrounding structures were obtained without intravenous contrast. COMPARISON:  None. FINDINGS: Brain: No acute infarct, mass effect or extra-axial collection. No acute or chronic hemorrhage. Hyperintense T2-weighted signal is moderately widespread throughout the white matter. Generalized volume loss without a clear lobar predilection. 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 Vascular: Major flow voids are preserved. Skull and upper cervical spine: Normal calvarium and skull base. Visualized upper cervical spine and soft tissues are normal. Sinuses/Orbits:No paranasal sinus fluid levels or advanced mucosal thickening. No mastoid or middle ear effusion. Normal orbits. IMPRESSION: 1. No acute intracranial abnormality. 2. Generalized volume loss and findings of chronic ischemic microangiopathy. Electronically Signed   By: Ulyses Jarred  M.D.   On: 12/08/2020 21:24    PHYSICAL EXAM HEENT-  [place]/AT   Lungs- Respirations unlabored Extremities- Warm and well perfused   Neurological Examination Mental Status: Alert, oriented x4.  Speech is clear and fluent.  Naming for common objects is intact. Able to follow all simple commands.  Cranial Nerves: II: Temporal visual fields intact with no extinction to DSS. PERRL.   III,IV, VI: No ptosis. EOMI. No nystagmus.  V,VII: Smile symmetric, facial temp sensation normal bilaterally VIII: hearing intact to voice IX,X: No hypophonia or hoarseness XI: Symmetric XII: Midline tongue extension Motor: Right :  Upper extremity   5/5                                      Left:     Upper extremity   5/5             Lower extremity   5/5                                                  Lower extremity   5/5 No pronator drift.  Sensory: Temp and light touch intact throughout, bilaterally. No extinction to DSS.   Cerebellar: No ataxia with FNF bilaterally  Gait: Deferred  ASSESSMENT/PLAN Holly Baldwin is a 75 y.o. female with medical history significant for type 2 diabetes mellitus, hypertension, hyperlipidemia, chronic diastolic heart failure, stage IV chronic kidney disease with baseline creatinine 1.9-2.3, who is admitted to [facility] on 12/08/2020 with suspected TIA after presenting from [place] ED to [facility] ED via ED to ED transfer after presenting to the former complaining of right-sided numbness.   Stroke like episode consisting of transient right sided UE and LE numbness with a possible TIA etiology.  Code Stroke CT head No acute abnormality 1. Stable head CT, no acute process. 1. No acute intracranial abnormality. 2. Generalized volume loss and findings of chronic ischemic microangiopathy. MRA  PENDING  Carotid Doppler: PENDING (prelim read negative for high grade stenosis)  2D Echo EF 60-65%. Mild LVH. Grade I diastolic dysfunction. Elevated left atrial pressure. Mild to moderate aortic valve sclerosis/calcification is present, no thrombus, wall motion abnormality or shunt found.  EKG read is pending LDL 79 HgbA1c 6.8 VTE prophylaxis - recommended unless discharging today, per primary team     Diet   Diet regular Room service appropriate? Yes; Fluid consistency: Thin   On ASA 81mg  PTA Medication recommendation pending completion of work up Therapy  recommendations:  Cleared  Disposition:  home  Follow up with Dr. Leonie Man 6 weeks at [facility]   Hypertension Unstable in ED with systolic 811B, now improved 147-829 systolic  Permissive hypertension (OK if < 220/120) but gradually normalize in 5-7 days Long-term BP goal normotensive with close follow up with PCP for adequate blood pressure control and overall stroke risk factor reduction   Hyperlipidemia Home meds:  Zocor 20mg  LDL 79, goal < 70 Continue statin at discharge  Diabetes type II Controlled HgbA1c 6.8, goal < 7.0 CBGs Recent Labs    12/08/20 2303  GLUCAP 176*    Management per primary team   Other Stroke Risk Factors Advanced Age >/= 65  Obesity, Body mass index is 36.1 kg/m., BMI >/= 30 associated with increased stroke  risk, recommend weight loss, diet and exercise as appropriate  High risk for Obstructive sleep apnea Congestive heart failure  Other Active Problems   Hospital day # 0  This patient was seen and evaluated with Dr. Reeves Forth. He directed the plan of care.  Delila Bailey-Modzik, NP-C  ATTENDING ATTESTATION:  This is a patient with history of anxiety diabetes CHF chronic abdominal pain hypertension with episodes of right arm and leg numbness.  MRI is negative for stroke there is atrophy noted.  Echo, MRA and Doppler still pending.  She is on aspirin at home would recommend dual antiplatelet therapy with aspirin and Plavix for 3 weeks and then transition back to aspirin.  Dr. Reeves Forth evaluated pt independently, reviewed imaging, chart, labs. Discussed and formulated plan with the APP. Please see APP note above for details.      This patient is critically ill due to respiratory distress, stroke  and at significant risk of neurological worsening, death form heart failure, respiratory failure, recurrent stroke, bleeding from [facility], seizure, sepsis. This patient's care requires constant monitoring of vital signs, hemodynamics, respiratory and cardiac monitoring,  review of multiple databases, neurological assessment, discussion with family, other specialists and medical decision making of high complexity. I spent 35 minutes of neurocritical care time in the care of this patient.   Eppie Barhorst,MD   To contact Stroke Continuity provider, please refer to [email or web address] After hours, contact General Neurology

## 2020-12-09 NOTE — Progress Notes (Signed)
2D echocardiogram completed.  12/09/2020 9:04 AM Kelby Aline., MHA, RVT, RDCS, RDMS

## 2020-12-09 NOTE — Progress Notes (Signed)
  Progress Note    Holly Baldwin   DYN:[id number]  DOB: 07/30/1945  DOA: 12/08/2020     0 Date of Service: 12/09/2020     Subjective:  No nausea no vomiting no fever no chills.  Numbness and weakness resolved.  Hospital Problems Holly Baldwin is a 75 y.o. female with medical history significant for type 2 diabetes mellitus, hypertension, hyperlipidemia, chronic diastolic heart failure, stage IV chronic kidney disease with baseline creatinine 1.9-2.3, who is admitted to [facility] on 12/08/2020 with suspected TIA after presenting from [place] ED to [facility] ED via ED to ED transfer after presenting to the former complaining of right-sided numbness.   TIA. CT head unremarkable. MRI brain negative for any acute stroke as well. MRI currently pending. Carotid Doppler negative for any acute LVO. Echocardiogram shows preserved EF.  No embolic etiology. LDL 79.  Currently on statin. Hemoglobin A1c 6.8. PT OT recommends no therapy. Outpatient follow-up with neurology recommended. Aspirin and Plavix for 3 weeks followed by aspirin only.  Type 2 diabetes mellitus, controlled without any long-term insulin use. Continue current regimen.  Essential hypertension. Blood pressure stable. Currently allowing permissive hypertension in the hospital.  Hyperlipidemia. Continue statin home dose.  Obesity. Placing the patient at high risk of poor outcome. May also have OSA.  Will benefit from work-up for this.Body mass index is 36.1 kg/m.    Objective Vital signs were reviewed and unremarkable.  Vitals:   12/09/20 0300 12/09/20 0556 12/09/20 0802 12/09/20 1142  BP: (!) 147/57  (!) 153/50 (!) 160/62  Pulse: 66  (!) 59 63  Resp: 18  15 17   Temp: 98.4 F (36.9 C)  98.9 F (37.2 C) 99 F (37.2 C)  TempSrc: Oral  Oral Oral  SpO2: 98%  97% 98%  Weight:  95.4 kg    Height:       95.4 kg  Exam General: Appear in mild distress, no Rash; Oral Mucosa Clear, moist. no Abnormal Neck Mass Or lumps,  Conjunctiva normal  Cardiovascular: S1 and S2 Present, no Murmur, Respiratory: good respiratory effort, Bilateral Air entry present and CTA, no Crackles, no wheezes Abdomen: Bowel Sound present, Soft and no tenderness Extremities: no Pedal edema Neurology: alert and oriented to time, place, and person affect appropriate. no new focal deficit Gait not checked due to patient safety concerns   Labs / Other Information My review of labs, imaging, notes and other tests shows no new significant findings.     Time spent: 35 mins [facility] 12/09/2020, 7:36 PM

## 2020-12-09 NOTE — Evaluation (Signed)
Physical Therapy Evaluation Patient Details Name: Holly Baldwin MRN: [id number] DOB: 03/27/1945 Today's Date: 12/09/2020  History of Present Illness  75 y.o. female brought to [facility] hospital from [place] ED on 12/08/2020 with complaint of R sided numbness. Imaging negative. PMH includes DMII, HTN, HLD, chronic diastolic heart failure, CKD IV.  Clinical Impression  Pt presents to PT at baseline, reporting no current symptoms at this time. Pt is able to ambulate independently, accepting dynamic gait challenges well without loss of balance. Pt expresses no concerns over current mobility status. Pt has no further acute PT needs. Acute PT signing off.       Recommendations for follow up therapy are one component of a multi-disciplinary discharge planning process, led by the attending physician.  Recommendations may be updated based on patient status, additional functional criteria and insurance authorization.  Follow Up Recommendations No PT follow up    Equipment Recommendations  None recommended by PT    Recommendations for Other Services       Precautions / Restrictions Precautions Precautions: None Restrictions Weight Bearing Restrictions: No      Mobility  Bed Mobility Overal bed mobility: Independent                  Transfers Overall transfer level: Independent                  Ambulation/Gait Ambulation/Gait assistance: Independent Gait Distance (Feet): 200 Feet Assistive device: None Gait Pattern/deviations: WFL(Within Functional Limits) Gait velocity: functiona Gait velocity interpretation: 1.31 - 2.62 ft/sec, indicative of limited community ambulator General Gait Details: pt is able to perform head turns, change step length, walk backward, stop and turn abruptly, all without loss of balance  Stairs            Wheelchair Mobility    Modified Rankin (Stroke Patients Only) Modified Rankin (Stroke Patients Only) Pre-Morbid Rankin Score: No  symptoms Modified Rankin: No symptoms     Balance Overall balance assessment: Independent                                           Pertinent Vitals/Pain Pain Assessment: No/denies pain    Home Living Family/patient expects to be discharged to:: Private residence Living Arrangements: Spouse/significant other Available Help at Discharge: Family;Available 24 hours/day Type of Home: House Home Access: Level entry     Home Layout: One level Home Equipment: Cane - single point;Shower seat      Prior Function Level of Independence: Independent               Hand Dominance        Extremity/Trunk Assessment   Upper Extremity Assessment Upper Extremity Assessment: Overall WFL for tasks assessed    Lower Extremity Assessment Lower Extremity Assessment: Overall WFL for tasks assessed    Cervical / Trunk Assessment Cervical / Trunk Assessment: Normal  Communication   Communication: No difficulties  Cognition Arousal/Alertness: Awake/alert Behavior During Therapy: WFL for tasks assessed/performed Overall Cognitive Status: Within Functional Limits for tasks assessed                                        General Comments General comments (skin integrity, edema, etc.): VSS on RA    Exercises     Assessment/Plan  PT Assessment Patent does not need any further PT services  PT Problem List         PT Treatment Interventions      PT Goals (Current goals can be found in the Care Plan section)       Frequency     Barriers to discharge        Co-evaluation               AM-PAC PT "6 Clicks" Mobility  Outcome Measure Help needed turning from your back to your side while in a flat bed without using bedrails?: None Help needed moving from lying on your back to sitting on the side of a flat bed without using bedrails?: None Help needed moving to and from a bed to a chair (including a wheelchair)?: None Help needed  standing up from a chair using your arms (e.g., wheelchair or bedside chair)?: None Help needed to walk in hospital room?: None Help needed climbing 3-5 steps with a railing? : None 6 Click Score: 24    End of Session   Activity Tolerance: Patient tolerated treatment well Patient left: in chair;with call bell/phone within reach Nurse Communication: Mobility status PT Visit Diagnosis: Other symptoms and signs involving the nervous system (R29.898)    Time: 8088-1103 PT Time Calculation (min) (ACUTE ONLY): 13 min   Charges:   PT Evaluation $PT Eval Low Complexity: [place], PT, DPT Acute Rehabilitation Pager: [phone]   Zenaida Niece 12/09/2020, 12:05 PM

## 2020-12-09 NOTE — Progress Notes (Signed)
OT Cancellation Note  Patient Details Name: Holly Baldwin MRN: [id number] DOB: 24-Jul-1945   Cancelled Treatment:    Reason Eval/Treat Not Completed: OT screened, no needs identified, will sign off. Pt independent with PT and reported back to baseline to them. I observed her up and moving around her room without any issues.  [place], OTR/L Acute Rehab Services Pager [phone] Office [phone]    Almon Register 12/09/2020, 5:01 PM

## 2020-12-09 NOTE — Progress Notes (Signed)
Carotid artery duplex completed. Refer to "CV Proc" under chart review to view preliminary results.  12/09/2020 9:04 AM Kelby Aline., MHA, RVT, RDCS, RDMS

## 2020-12-10 DIAGNOSIS — I10 Essential (primary) hypertension: Secondary | ICD-10-CM | POA: Diagnosis not present

## 2020-12-10 DIAGNOSIS — G459 Transient cerebral ischemic attack, unspecified: Secondary | ICD-10-CM | POA: Diagnosis not present

## 2020-12-10 MED ORDER — NIFEDIPINE ER OSMOTIC RELEASE 60 MG PO TB24
60.0000 mg | ORAL_TABLET | Freq: Every day | ORAL | Status: DC
  Administered 2020-12-10: 60 mg via ORAL
  Filled 2020-12-10: qty 1

## 2020-12-10 MED ORDER — BISACODYL 10 MG RE SUPP
10.0000 mg | Freq: Once | RECTAL | Status: AC
  Administered 2020-12-10: 10 mg via RECTAL
  Filled 2020-12-10: qty 1

## 2020-12-10 MED ORDER — CLOPIDOGREL BISULFATE 75 MG PO TABS: 75.0000 mg | ORAL_TABLET | Freq: Every day | ORAL | 11 refills | Status: AC

## 2020-12-10 MED ORDER — ASPIRIN EC 81 MG PO TBEC: 81.0000 mg | DELAYED_RELEASE_TABLET | Freq: Every day | ORAL | 0 refills | Status: AC

## 2020-12-10 MED ORDER — HYDRALAZINE HCL 50 MG PO TABS
100.0000 mg | ORAL_TABLET | Freq: Three times a day (TID) | ORAL | Status: DC
  Administered 2020-12-10: 100 mg via ORAL
  Filled 2020-12-10: qty 2

## 2020-12-10 MED ORDER — HYDRALAZINE HCL 25 MG PO TABS: 25.0000 mg | ORAL_TABLET | Freq: Once | ORAL | Status: DC

## 2020-12-10 NOTE — Progress Notes (Signed)
Pt given dc instructions. Taken to vehicle via wc.

## 2020-12-10 NOTE — Progress Notes (Signed)
STROKE TEAM PROGRESS NOTE   INTERVAL HISTORY She continues to report resolution of her symptoms. Dr. Leonie Man discussed her TIA diagnosis, work up and plan of care. Her questions were answered. We also discussed possible need for OSA evaluation. She is not agreeable to staying for it.   We discussed her TIA diagnosis, work up and plan of care. Her questions were answered.   Vitals:   12/09/20 1142 12/09/20 2331 12/10/20 0312 12/10/20 0856  BP: (!) 160/62 (!) 183/64 (!) 202/85 (!) 194/79  Pulse: 63 64 64 94  Resp: 17   20  Temp: 99 F (37.2 C) 99 F (37.2 C) 99.1 F (37.3 C) 98.6 F (37 C)  TempSrc: Oral Oral Oral Oral  SpO2: 98% 98% 98% 95%  Weight:      Height:       CBC:  Recent Labs  Lab 12/08/20 1550 12/09/20 0432  WBC 6.7 7.1  NEUTROABS 4.5  --   HGB 11.2* 10.3*  HCT 35.3* 33.1*  MCV 84.2 82.5  PLT 266 599   Basic Metabolic Panel:  Recent Labs  Lab 12/08/20 1550 12/09/20 0432  NA 140 139  K 3.4* 3.5  CL 108 107  CO2 24 24  GLUCOSE 237* 208*  BUN 48* 38*  CREATININE 1.97* 1.96*  CALCIUM 8.4* 8.6*  MG  --  2.0   Lipid Panel:  Recent Labs  Lab 12/09/20 0432  CHOL 148  TRIG 170*  HDL 35*  CHOLHDL 4.2  VLDL 34  LDLCALC 79   HgbA1c:  Recent Labs  Lab 12/09/20 0432  HGBA1C 6.8*   Urine Drug Screen: No results for input(s): LABOPIA, COCAINSCRNUR, LABBENZ, AMPHETMU, THCU, LABBARB in the last 168 hours.  Alcohol Level No results for input(s): ETH in the last 168 hours.  IMAGING past 24 hours MR ANGIO HEAD WO CONTRAST  Result Date: 12/09/2020 CLINICAL DATA:  Acute neurologic deficit EXAM: MRA HEAD WITHOUT CONTRAST TECHNIQUE: Angiographic images of the Circle of Willis were acquired using MRA technique without intravenous contrast. COMPARISON:  No pertinent prior exam. FINDINGS: POSTERIOR CIRCULATION: --Vertebral arteries: Normal --Inferior cerebellar arteries: Normal. --Basilar artery: Normal. --Superior cerebellar arteries: Normal. --Posterior cerebral  arteries: Mild stenosis of the proximal left P2 segment. Otherwise normal. ANTERIOR CIRCULATION: --Intracranial internal carotid arteries: Normal. --Anterior cerebral arteries (ACA): Normal. --Middle cerebral arteries (MCA): Normal. ANATOMIC VARIANTS: None IMPRESSION: 1. No emergent large vessel occlusion or high-grade stenosis. 2. Mild stenosis of the proximal left P2 segment. Electronically Signed   By: Ulyses Jarred M.D.   On: 12/09/2020 20:27    PHYSICAL EXAM HEENT-  [place]/AT   Lungs- Respirations unlabored Extremities- Warm and well perfused   Neurological Examination Mental Status: Alert, oriented x4.  Speech is clear and fluent.  Naming for common objects is intact. Able to follow all simple commands.  Cranial Nerves: II: Temporal visual fields intact with no extinction to DSS. PERRL.  III,IV, VI: No ptosis. EOMI. No nystagmus.  V,VII: Smile symmetric, facial temp sensation normal bilaterally VIII: hearing intact to voice IX,X: No hypophonia or hoarseness XI: Symmetric XII: Midline tongue extension Motor: Right :  Upper extremity   5/5                                      Left:     Upper extremity   5/5             Lower  extremity   5/5                                                  Lower extremity   5/5 No pronator drift.  Sensory: Temp and light touch intact throughout, bilaterally. No extinction to DSS.   Cerebellar: No ataxia with FNF bilaterally  Gait: Deferred  ASSESSMENT/PLAN Holly Baldwin is a 75 y.o. female with medical history significant for type 2 diabetes mellitus, hypertension, hyperlipidemia, chronic diastolic heart failure, stage IV chronic kidney disease with baseline creatinine 1.9-2.3, who is admitted to [facility] on 12/08/2020 with suspected TIA after presenting from [place] ED to [facility] ED via ED to ED transfer after presenting to the former complaining of right-sided numbness.   Stroke like episode consisting of transient right sided UE and LE numbness with a  possible TIA etiology.  Likely small vessel disease Code Stroke CT head No acute abnormality 1. Stable head CT, no acute process. 1. No acute intracranial abnormality. 2. Generalized volume loss and findings of chronic ischemic microangiopathy. MRA   1. No emergent large vessel occlusion or high-grade stenosis. 2. Mild stenosis of the proximal left P2 segment. Carotid Doppler:  Right Carotid: Velocities in the right ICA are consistent with a 1-39%  stenosis.  Left Carotid: Velocities in the left ICA are consistent with a 1-39%  stenosis. Vertebrals:  Bilateral vertebral arteries demonstrate antegrade flow.  Subclavians: Normal flow hemodynamics were seen in bilateral subclavian arteries.  2D Echo EF 60-65%. Mild LVH. Grade I diastolic dysfunction. Elevated left atrial pressure. Mild to moderate aortic valve sclerosis/calcification is present, no thrombus, wall motion abnormality or shunt found.  EKG read is pending LDL 79 HgbA1c 6.8 VTE prophylaxis - recommended unless discharging today, per primary team     Diet   Diet regular Room service appropriate? Yes; Fluid consistency: Thin   On ASA 81mg  PTA On  Therapy recommendations:  Cleared  Disposition:  home  Follow up with Dr. Leonie Man 6 weeks at [place]   Hypertension Unstable in ED with systolic 638L, now improved 373-428 systolic  Permissive hypertension (OK if < 220/120) but gradually normalize in 5-7 days Long-term BP goal normotensive with close follow up with PCP for adequate blood pressure control and overall stroke risk factor reduction   Hyperlipidemia Home meds:  Zocor 20mg  LDL 79, goal < 70 Continue statin at discharge  Diabetes type II Controlled HgbA1c 6.8, goal < 7.0 CBGs Recent Labs    12/09/20 1025 12/09/20 1800 12/09/20 2205  GLUCAP 152* 166* 208*    Management per primary team   Other Stroke Risk Factors Advanced Age >/= 65  Obesity, Body mass index is 36.1 kg/m., BMI >/= 30 associated with increased  stroke risk, recommend weight loss, diet and exercise as appropriate  High risk for Obstructive sleep apnea, may need outpatient referral for sleep study  Congestive heart failure  Other [facility] day # 0  This patient was seen and evaluated with Dr. Leonie Man. He directed the plan of care.  Charlene Brooke, NP-C  STROKE MD NOTE : She presented with transient right sided numbness and weakness likely left hemispheric TIA from small vessel disease.  Neurological exam is nonfocal.  MRI scan of the brain was negative for any acute abnormality and MRI brain only shows mild left P2 stenosis.  2D echo showed normal ejection fraction.  LDL cholesterol 79 mg percent hemoglobin A1c 6.8.  Patient appears to be at risk for sleep apnea but is not willing to stay for further evaluation.  Recommend aspirin and Plavix for 3 weeks followed by Plavix alone.  Increase dose of statin.  Follow-up as an outpatient stroke clinic in 2 months.  Discussed with Dr. Posey Pronto.  Greater than 50% time during this 35-minute visit was spent in counseling and coordination of care and discussion with care team.  Stroke team will sign off.  Kindly call for questions Antony Contras MD To contact Stroke Continuity provider, please refer to [email or web address] After hours, contact General Neurology

## 2020-12-12 DIAGNOSIS — E211 Secondary hyperparathyroidism, not elsewhere classified: Secondary | ICD-10-CM | POA: Diagnosis not present

## 2020-12-12 DIAGNOSIS — R809 Proteinuria, unspecified: Secondary | ICD-10-CM | POA: Diagnosis not present

## 2020-12-12 DIAGNOSIS — E1129 Type 2 diabetes mellitus with other diabetic kidney complication: Secondary | ICD-10-CM | POA: Diagnosis not present

## 2020-12-12 DIAGNOSIS — I5032 Chronic diastolic (congestive) heart failure: Secondary | ICD-10-CM | POA: Diagnosis not present

## 2020-12-12 DIAGNOSIS — G459 Transient cerebral ischemic attack, unspecified: Secondary | ICD-10-CM | POA: Diagnosis not present

## 2020-12-12 DIAGNOSIS — D631 Anemia in chronic kidney disease: Secondary | ICD-10-CM | POA: Diagnosis not present

## 2020-12-12 DIAGNOSIS — N189 Chronic kidney disease, unspecified: Secondary | ICD-10-CM | POA: Diagnosis not present

## 2020-12-12 DIAGNOSIS — E1122 Type 2 diabetes mellitus with diabetic chronic kidney disease: Secondary | ICD-10-CM | POA: Diagnosis not present

## 2020-12-12 NOTE — Discharge Summary (Addendum)
Physician Discharge Summary   Patient name: Holly Baldwin  Admit date:     12/08/2020  Discharge date: 12/10/2020   Attending Physician:   Discharge Physician: Berle Mull   PCP: Sharilyn Sites, MD    Follow-up Information     Sharilyn Sites, MD Follow up.   Specialty: Family Medicine Contact information: [address] [place] [address] [phone]         Garvin Fila, MD Follow up in 6 week(s).   Specialties: Neurology, Radiology Contact information: [address] [place] [address] [phone]                 Recommendations at discharge: follow up as recommended   Discharge Diagnoses Principal Problem:   TIA (transient ischemic attack) Active Problems:   DM2 (diabetes mellitus, type 2) ([place])   Primary hypothyroidism   Hypokalemia   Hypercholesteremia   Hypertension   Chronic diastolic CHF (congestive heart failure) ([place])   Resolved Diagnoses Resolved Problems:   * No resolved hospital problems. [facility]   Holly Baldwin is a 75 y.o. female with medical history significant for type 2 diabetes mellitus, hypertension, hyperlipidemia, chronic diastolic heart failure, stage IV chronic kidney disease with baseline creatinine 1.9-2.3, who is admitted to [facility] on 12/08/2020 with suspected TIA after presenting from [place] ED to [facility] [place] ED via ED to ED transfer after presenting to the former complaining of right-sided numbness.    TIA. CT head unremarkable. MRI brain negative for any acute stroke as well. MRA negative for any significant aneurysm or obstruction.  Carotid Doppler negative for any acute LVO. Echocardiogram shows preserved EF.  No embolic etiology. LDL 79.  Currently on statin. Hemoglobin A1c 6.8. PT OT recommends no therapy. Outpatient follow-up with neurology recommended. Aspirin and Plavix for 3 weeks followed by plavix only.   Type 2 diabetes mellitus, controlled without any long-term  insulin use. Continue current regimen.   Essential hypertension. Blood pressure elevated . Currently allowing permissive hypertension in the hospital. Resume meds on discharge.    Hyperlipidemia. Continue statin home dose.   Obesity. Placing the patient at high risk of poor outcome. Body mass index is 36.1 kg/m.   Procedures performed: Echocardiogram    Condition at discharge: good  Exam General: Appear in mild distress, no Rash; Oral Mucosa Clear, moist. no Abnormal Neck Mass Or lumps, Conjunctiva normal  Cardiovascular: S1 and S2 Present, no Murmur, Respiratory: good respiratory effort, Bilateral Air entry present and CTA, no Crackles, no wheezes Abdomen: Bowel Sound present, Soft and no tenderness Extremities: no Pedal edema Neurology: alert and oriented to time, place, and person affect appropriate. no new focal deficit Gait not checked due to patient safety concerns    Disposition: Home  Discharge time: greater than 30 minutes. Allergies as of 12/10/2020       Reactions   Bee Venom Anaphylaxis   Penicillins Hives   .Has patient had a PCN reaction causing immediate rash, facial/tongue/throat swelling, SOB or lightheadedness with hypotension: Yes Has patient had a PCN reaction causing severe rash involving mucus membranes or skin necrosis: No Has patient had a PCN reaction that required hospitalization: Yes Has patient had a PCN reaction occurring within the last 10 years: No If all of the above answers are "NO", then may proceed with Cephalosporin use.        Medication List     STOP taking these medications    silver sulfADIAZINE 1 %  cream Commonly known as: Silvadene   sulfamethoxazole-trimethoprim 800-160 MG tablet Commonly known as: BACTRIM DS       TAKE these medications    Accu-Chek Aviva Plus test strip Generic drug: glucose blood TEST BLOOD GLUCOSE FOUR TIMES DAILY   Accu-Chek Aviva Plus w/Device Kit   albuterol 108 (90 Base) MCG/ACT  inhaler Commonly known as: VENTOLIN HFA Inhale 1-2 puffs into the lungs every 6 (six) hours as needed for wheezing or shortness of breath.   aspirin EC 81 MG tablet Take 1 tablet (81 mg total) by mouth daily for 21 days. Swallow whole. What changed:  when to take this additional instructions   calcitRIOL 0.25 MCG capsule Commonly known as: ROCALTROL Take 0.25 mcg by mouth every Monday, Wednesday, and Friday.   clopidogrel 75 MG tablet Commonly known as: Plavix Take 1 tablet (75 mg total) by mouth daily.   furosemide 40 MG tablet Commonly known as: LASIX Take 40 mg by mouth 2 (two) times daily.   gabapentin 100 MG capsule Commonly known as: NEURONTIN TAKE 1 CAPSULE TWICE DAILY   glipiZIDE 5 MG tablet Commonly known as: GLUCOTROL Take 5 mg by mouth daily before breakfast.   hydrALAZINE 100 MG tablet Commonly known as: APRESOLINE Take 1 tablet (100 mg total) by mouth 3 (three) times daily.   insulin lispro 100 UNIT/ML KwikPen Commonly known as: HUMALOG Inject 10 Units into the skin 3 (three) times daily. With meals   isosorbide mononitrate 60 MG 24 hr tablet Commonly known as: IMDUR Take 1 tablet (60 mg total) by mouth daily.   Lantus SoloStar 100 UNIT/ML Solostar Pen Generic drug: insulin glargine INJECT 40 UNITS SUBCUTANEOUSLY AT 10PM. What changed: See the new instructions.   levothyroxine 137 MCG tablet Commonly known as: SYNTHROID TAKE 1 TABLET (137 MCG TOTAL) BY MOUTH DAILY BEFORE BREAKFAST.   lisinopril 30 MG tablet Commonly known as: ZESTRIL Take 30 mg by mouth daily.   NIFEdipine 60 MG 24 hr tablet Commonly known as: PROCARDIA XL/NIFEDICAL XL Take 60 mg by mouth daily.   simvastatin 40 MG tablet Commonly known as: ZOCOR TAKE 1 TABLET EVERY DAY   ziprasidone 40 MG capsule Commonly known as: GEODON Take 40 mg by mouth 2 (two) times daily with a meal.        CT Head Wo Contrast  Result Date: 12/08/2020 CLINICAL DATA:  Neurologic deficit  EXAM: CT HEAD WITHOUT CONTRAST TECHNIQUE: Contiguous axial images were obtained from the base of the skull through the vertex without intravenous contrast. COMPARISON:  11/29/2020 FINDINGS: Brain: Stable small-vessel ischemic changes throughout the periventricular white matter and left basal ganglia. No acute infarct or hemorrhage. Lateral ventricles and remaining midline structures are unremarkable. No acute extra-axial fluid collections. No mass effect. Vascular: No hyperdense vessel or unexpected calcification. Skull: Normal. Negative for fracture or focal lesion. Sinuses/Orbits: No acute finding. Other: None. IMPRESSION: 1. Stable head CT, no acute process. Electronically Signed   By: Randa Ngo M.D.   On: 12/08/2020 19:03   CT Head Wo Contrast  Result Date: 11/29/2020 CLINICAL DATA:  Syncope EXAM: CT HEAD WITHOUT CONTRAST TECHNIQUE: Contiguous axial images were obtained from the base of the skull through the vertex without intravenous contrast. COMPARISON:  CT brain 08/27/2018 FINDINGS: Brain: No acute territorial infarction, hemorrhage, or intracranial. Mild atrophy. Moderate chronic small vessel ischemic changes of the white matter. Chronic lacunar infarct within left basal ganglia. Left deep white matter hypodensity, new since CT from 2020, suspected to represent interval lacunar infarct  but appears chronic. Nonenlarged ventricles Vascular: No hyperdense vessels.  Carotid vascular calcification Skull: Normal. Negative for fracture or focal lesion. Sinuses/Orbits: No acute finding. Other: None IMPRESSION: 1. No CT evidence for acute intracranial abnormality. 2. Atrophy and chronic small vessel ischemic changes of the white matter. Chronic appearing lacunar infarcts in the left basal ganglia deep white matter Electronically Signed   By: Donavan Foil M.D.   On: 11/29/2020 18:49   MR ANGIO HEAD WO CONTRAST  Result Date: 12/09/2020 CLINICAL DATA:  Acute neurologic deficit EXAM: MRA HEAD WITHOUT  CONTRAST TECHNIQUE: Angiographic images of the Circle of Willis were acquired using MRA technique without intravenous contrast. COMPARISON:  No pertinent prior exam. FINDINGS: POSTERIOR CIRCULATION: --Vertebral arteries: Normal --Inferior cerebellar arteries: Normal. --Basilar artery: Normal. --Superior cerebellar arteries: Normal. --Posterior cerebral arteries: Mild stenosis of the proximal left P2 segment. Otherwise normal. ANTERIOR CIRCULATION: --Intracranial internal carotid arteries: Normal. --Anterior cerebral arteries (ACA): Normal. --Middle cerebral arteries (MCA): Normal. ANATOMIC VARIANTS: None IMPRESSION: 1. No emergent large vessel occlusion or high-grade stenosis. 2. Mild stenosis of the proximal left P2 segment. Electronically Signed   By: Ulyses Jarred M.D.   On: 12/09/2020 20:27   MR BRAIN WO CONTRAST  Result Date: 12/08/2020 CLINICAL DATA:  Acute neurologic deficit EXAM: MRI HEAD WITHOUT CONTRAST TECHNIQUE: Multiplanar, multiecho pulse sequences of the brain and surrounding structures were obtained without intravenous contrast. COMPARISON:  None. FINDINGS: Brain: No acute infarct, mass effect or extra-axial collection. No acute or chronic hemorrhage. Hyperintense T2-weighted signal is moderately widespread throughout the white matter. Generalized volume loss without a clear lobar predilection. 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 Vascular: Major flow voids are preserved. Skull and upper cervical spine: Normal calvarium and skull base. Visualized upper cervical spine and soft tissues are normal. Sinuses/Orbits:No paranasal sinus fluid levels or advanced mucosal thickening. No mastoid or middle ear effusion. Normal orbits. IMPRESSION: 1. No acute intracranial abnormality. 2. Generalized volume loss and findings of chronic ischemic microangiopathy. Electronically Signed   By: Ulyses Jarred M.D.   On: 12/08/2020 21:24   ECHOCARDIOGRAM COMPLETE  Result Date:  12/09/2020    ECHOCARDIOGRAM REPORT   Patient Name:   Holly Baldwin Date of Exam: 12/09/2020 Medical Rec #:  [id number]    Height:       64.0 in Accession #:    [id number]   Weight:       210.3 lb Date of Birth:  [birth year]    BSA:          1.999 m Patient Age:    53 years     BP:           153/50 mmHg Patient Gender: F            HR:           60 bpm. Exam Location:  Inpatient Procedure: 2D Echo, Cardiac Doppler and Color Doppler Indications:    TIA  History:        Patient has prior history of Echocardiogram examinations, most                 recent 08/20/2018. Risk Factors:Diabetes, Dyslipidemia and                 Hypertension.  Sonographer:    Maudry Mayhew MHA, [place], RVT, RDCS Referring Phys: [id number] Rhetta Mura  Sonographer Comments: Image acquisition challenging due to patient body habitus. IMPRESSIONS  1. Left ventricular ejection fraction, by  estimation, is 60 to 65%. The left ventricle has normal function. The left ventricle has no regional wall motion abnormalities. There is mild left ventricular hypertrophy. Left ventricular diastolic parameters are consistent with Grade I diastolic dysfunction (impaired relaxation). Elevated left atrial pressure.  2. Right ventricular systolic function is normal. The right ventricular size is normal. There is normal pulmonary artery systolic pressure. The estimated right ventricular systolic pressure is 86.5 mmHg.  3. The mitral valve is normal in structure. Mild mitral valve regurgitation. No evidence of mitral stenosis.  4. The aortic valve is normal in structure. Aortic valve regurgitation is not visualized. Mild to moderate aortic valve sclerosis/calcification is present, without any evidence of aortic stenosis.  5. The inferior vena cava is normal in size with greater than 50% respiratory variability, suggesting right atrial pressure of 3 mmHg. Comparison(s): No significant change from prior study. Prior images reviewed side by side.  Conclusion(s)/Recommendation(s): No intracardiac source of embolism detected on this transthoracic study. A transesophageal echocardiogram is recommended to exclude cardiac source of embolism if clinically indicated. FINDINGS  Left Ventricle: Left ventricular ejection fraction, by estimation, is 60 to 65%. The left ventricle has normal function. The left ventricle has no regional wall motion abnormalities. The left ventricular internal cavity size was normal in size. There is  mild left ventricular hypertrophy. Left ventricular diastolic parameters are consistent with Grade I diastolic dysfunction (impaired relaxation). Elevated left atrial pressure. Right Ventricle: The right ventricular size is normal. No increase in right ventricular wall thickness. Right ventricular systolic function is normal. There is normal pulmonary artery systolic pressure. The tricuspid regurgitant velocity is 2.48 m/s, and  with an assumed right atrial pressure of 3 mmHg, the estimated right ventricular systolic pressure is 78.4 mmHg. Left Atrium: Left atrial size was normal in size. Right Atrium: Right atrial size was normal in size. Pericardium: There is no evidence of pericardial effusion. Mitral Valve: The mitral valve is normal in structure. Mild mitral annular calcification. Mild mitral valve regurgitation. No evidence of mitral valve stenosis. Tricuspid Valve: The tricuspid valve is normal in structure. Tricuspid valve regurgitation is mild . No evidence of tricuspid stenosis. Aortic Valve: The aortic valve is normal in structure. Aortic valve regurgitation is not visualized. Mild to moderate aortic valve sclerosis/calcification is present, without any evidence of aortic stenosis. Aortic valve mean gradient measures 4.0 mmHg. Aortic valve peak gradient measures 7.0 mmHg. Aortic valve area, by VTI measures 2.72 cm. Pulmonic Valve: The pulmonic valve was normal in structure. Pulmonic valve regurgitation is not visualized. No  evidence of pulmonic stenosis. Aorta: The aortic root is normal in size and structure. Venous: The inferior vena cava is normal in size with greater than 50% respiratory variability, suggesting right atrial pressure of 3 mmHg. IAS/Shunts: No atrial level shunt detected by color flow Doppler.  LEFT VENTRICLE PLAX 2D LVIDd:         4.50 cm     Diastology LV PW:         0.90 cm     LV e' medial:    3.00 cm/s LV IVS:        1.40 cm     LV E/e' medial:  26.3 LVOT diam:     2.40 cm     LV e' lateral:   5.68 cm/s LV SV:         81          LV E/e' lateral: 13.9 LV SV Index:   40 LVOT Area:  4.52 cm  LV Volumes (MOD) LV vol d, MOD A2C: 43.4 ml LV vol d, MOD A4C: 55.2 ml LV vol s, MOD A2C: 18.6 ml LV vol s, MOD A4C: 20.7 ml LV SV MOD A2C:     24.8 ml LV SV MOD A4C:     55.2 ml LV SV MOD BP:      31.4 ml RIGHT VENTRICLE RV S prime:     14.80 cm/s LEFT ATRIUM             Index        RIGHT ATRIUM           Index LA diam:        3.90 cm 1.95 cm/m   RA Area:     13.00 cm LA Vol (A2C):   67.6 ml 33.82 ml/m  RA Volume:   25.80 ml  12.91 ml/m LA Vol (A4C):   47.8 ml 23.92 ml/m LA Biplane Vol: 61.2 ml 30.62 ml/m  AORTIC VALVE AV Area (Vmax):    2.59 cm AV Area (Vmean):   2.59 cm AV Area (VTI):     2.72 cm AV Vmax:           132.00 cm/s AV Vmean:          87.700 cm/s AV VTI:            0.296 m AV Peak Grad:      7.0 mmHg AV Mean Grad:      4.0 mmHg LVOT Vmax:         75.50 cm/s LVOT Vmean:        50.300 cm/s LVOT VTI:          0.178 m LVOT/AV VTI ratio: 0.60  AORTA Ao Root diam: 3.10 cm MITRAL VALVE               TRICUSPID VALVE MV Area (PHT): 4.71 cm    TR Peak grad:   24.6 mmHg MV Decel Time: 161 msec    TR Vmax:        248.00 cm/s MR Peak grad: 41.2 mmHg MR Vmax:      321.00 cm/s  SHUNTS MV E velocity: 78.80 cm/s  Systemic VTI:  0.18 m MV A velocity: 89.20 cm/s  Systemic Diam: 2.40 cm MV E/A ratio:  0.88 Candee Furbish MD Electronically signed by Candee Furbish MD Signature Date/Time: 12/09/2020/12:15:49 PM    Final     VAS US CAROTID  Result Date: 12/10/2020 Carotid Arterial Duplex Study Patient Name:  DAZHA KEMPA Walrath  Date of Exam:   12/09/2020 Medical Rec #: [id number]     Accession #:    [id number] Date of Birth: [birth year]     Patient Gender: F Patient Age:   80 years Exam [facility]  [facility] Procedure:      VAS US CAROTID Referring Phys: Babs Bertin --------------------------------------------------------------------------------  Indications:       TIA. Risk Factors:      Hypertension, hyperlipidemia, Diabetes. Comparison Study:  No prior study Performing Technologist: Maudry Mayhew MHA, RDMS, RVT, RDCS  Examination Guidelines: A complete evaluation includes B-mode imaging, spectral Doppler, color Doppler, and power Doppler as needed of all accessible portions of each vessel. Bilateral testing is considered an integral part of a complete examination. Limited examinations for reoccurring indications may be performed as noted.  Right Carotid Findings: +----------+--------+--------+--------+-----------------------+--------+           PSV cm/sEDV cm/sStenosisPlaque Description     Comments +----------+--------+--------+--------+-----------------------+--------+ CCA  Prox  80      14                                              +----------+--------+--------+--------+-----------------------+--------+ CCA Distal76      13                                              +----------+--------+--------+--------+-----------------------+--------+ ICA Prox  60      15                                              +----------+--------+--------+--------+-----------------------+--------+ ICA Distal76      18                                              +----------+--------+--------+--------+-----------------------+--------+ ECA       82      10              smooth and heterogenous         +----------+--------+--------+--------+-----------------------+--------+  +----------+--------+-------+----------------+-------------------+           PSV cm/sEDV cmsDescribe        Arm Pressure (mmHG) +----------+--------+-------+----------------+-------------------+ Subclavian132            Multiphasic, WNL                    +----------+--------+-------+----------------+-------------------+ +---------+--------+--+--------+-+---------+ VertebralPSV cm/s43EDV cm/s9Antegrade +---------+--------+--+--------+-+---------+  Left Carotid Findings: +----------+--------+-------+--------+--------------------------------+--------+           PSV cm/sEDV    StenosisPlaque Description              Comments                   cm/s                                                    +----------+--------+-------+--------+--------------------------------+--------+ CCA Prox  67      11                                                      +----------+--------+-------+--------+--------------------------------+--------+ CCA Distal57      12             irregular, focal and                                                      heterogenous                             +----------+--------+-------+--------+--------------------------------+--------+  ICA Prox  47      11                                                      +----------+--------+-------+--------+--------------------------------+--------+ ICA Distal73      22                                                      +----------+--------+-------+--------+--------------------------------+--------+ ECA       97      10                                                      +----------+--------+-------+--------+--------------------------------+--------+ +----------+--------+--------+----------------+-------------------+           PSV cm/sEDV cm/sDescribe        Arm Pressure (mmHG) +----------+--------+--------+----------------+-------------------+ [id number]              Multiphasic, WNL                    +----------+--------+--------+----------------+-------------------+ +---------+--------+--+--------+--+---------+ VertebralPSV cm/s73EDV cm/s16Antegrade +---------+--------+--+--------+--+---------+   Summary: Right Carotid: Velocities in the right ICA are consistent with a 1-39% stenosis. Left Carotid: Velocities in the left ICA are consistent with a 1-39% stenosis. Vertebrals:  Bilateral vertebral arteries demonstrate antegrade flow. Subclavians: Normal flow hemodynamics were seen in bilateral subclavian              arteries. *See table(s) above for measurements and observations.  Electronically signed by Orlie Pollen on 12/10/2020 at 6:50:52 AM.    Final    Results for orders placed or performed during the hospital encounter of 12/08/20  Resp Panel by RT-PCR (Flu A&B, Covid) Nasopharyngeal Swab     Status: None   Collection Time: 12/08/20  5:14 PM   Specimen: Nasopharyngeal Swab; Nasopharyngeal(NP) swabs in vial transport medium  Result Value Ref Range Status   SARS Coronavirus 2 by RT PCR NEGATIVE NEGATIVE Final    Comment: (NOTE) SARS-CoV-2 target nucleic acids are NOT DETECTED.  The SARS-CoV-2 RNA is generally detectable in upper respiratory specimens during the acute phase of infection. The lowest concentration of SARS-CoV-2 viral copies this assay can detect is 138 copies/mL. A negative result does not preclude SARS-Cov-2 infection and should not be used as the sole basis for treatment or other patient management decisions. A negative result may occur with  improper specimen collection/handling, submission of specimen other than nasopharyngeal swab, presence of viral mutation(s) within the areas targeted by this assay, and inadequate number of viral copies(<138 copies/mL). A negative result must be combined with clinical observations, patient history, and epidemiological information. The expected result is Negative.  Fact Sheet for  Patients:  EntrepreneurPulse.com.au  Fact Sheet for Healthcare Providers:  IncredibleEmployment.be  This test is no t yet approved or cleared by the [place] FDA and  has been authorized for detection and/or diagnosis of SARS-CoV-2 by FDA under an Emergency Use Authorization (EUA). This EUA will remain  in effect (meaning this test can be used) for the duration of the COVID-19 declaration  under Section 564(b)(1) of the Act, 21 U.S.C.section 360bbb-3(b)(1), unless the authorization is terminated  or revoked sooner.       Influenza A by PCR NEGATIVE NEGATIVE Final   Influenza B by PCR NEGATIVE NEGATIVE Final    Comment: (NOTE) The Xpert Xpress SARS-CoV-2/FLU/RSV plus assay is intended as an aid in the diagnosis of influenza from Nasopharyngeal swab specimens and should not be used as a sole basis for treatment. Nasal washings and aspirates are unacceptable for Xpert Xpress SARS-CoV-2/FLU/RSV testing.  Fact Sheet for Patients: EntrepreneurPulse.com.au  Fact Sheet for Healthcare Providers: IncredibleEmployment.be  This test is not yet approved or cleared by the [place] FDA and has been authorized for detection and/or diagnosis of SARS-CoV-2 by FDA under an Emergency Use Authorization (EUA). This EUA will remain in effect (meaning this test can be used) for the duration of the COVID-19 declaration under Section 564(b)(1) of the Act, 21 U.S.C. section 360bbb-3(b)(1), unless the authorization is terminated or revoked.  Performed at [facility], [address], [place], [place] [address]     Signed:  Berle Mull MD.  [facility] 12/12/2020, 7:17 AM

## 2020-12-17 DIAGNOSIS — H04123 Dry eye syndrome of bilateral lacrimal glands: Secondary | ICD-10-CM | POA: Diagnosis not present

## 2020-12-21 ENCOUNTER — Emergency Department (HOSPITAL_COMMUNITY)
Admission: EM | Admit: 2020-12-21 | Discharge: 2020-12-21 | Disposition: A | Discharge status: Home / Self Care | Payer: Medicare Other | Attending: Emergency Medicine | Admitting: Emergency Medicine

## 2020-12-21 ENCOUNTER — Emergency Department (HOSPITAL_COMMUNITY): Payer: Medicare Other

## 2020-12-21 ENCOUNTER — Encounter (HOSPITAL_COMMUNITY): Payer: Self-pay | Admitting: *Deleted

## 2020-12-21 DIAGNOSIS — R531 Weakness: Secondary | ICD-10-CM | POA: Diagnosis not present

## 2020-12-21 DIAGNOSIS — N183 Chronic kidney disease, stage 3 unspecified: Secondary | ICD-10-CM | POA: Diagnosis not present

## 2020-12-21 DIAGNOSIS — Z7982 Long term (current) use of aspirin: Secondary | ICD-10-CM | POA: Diagnosis not present

## 2020-12-21 DIAGNOSIS — I129 Hypertensive chronic kidney disease with stage 1 through stage 4 chronic kidney disease, or unspecified chronic kidney disease: Secondary | ICD-10-CM | POA: Insufficient documentation

## 2020-12-21 DIAGNOSIS — R29898 Other symptoms and signs involving the musculoskeletal system: Secondary | ICD-10-CM | POA: Diagnosis not present

## 2020-12-21 DIAGNOSIS — E039 Hypothyroidism, unspecified: Secondary | ICD-10-CM | POA: Insufficient documentation

## 2020-12-21 DIAGNOSIS — Z7984 Long term (current) use of oral hypoglycemic drugs: Secondary | ICD-10-CM | POA: Insufficient documentation

## 2020-12-21 DIAGNOSIS — E1122 Type 2 diabetes mellitus with diabetic chronic kidney disease: Secondary | ICD-10-CM | POA: Diagnosis not present

## 2020-12-21 DIAGNOSIS — Z794 Long term (current) use of insulin: Secondary | ICD-10-CM | POA: Diagnosis not present

## 2020-12-21 DIAGNOSIS — Z79899 Other long term (current) drug therapy: Secondary | ICD-10-CM | POA: Diagnosis not present

## 2020-12-21 DIAGNOSIS — Z7902 Long term (current) use of antithrombotics/antiplatelets: Secondary | ICD-10-CM | POA: Insufficient documentation

## 2020-12-21 DIAGNOSIS — I5033 Acute on chronic diastolic (congestive) heart failure: Secondary | ICD-10-CM | POA: Diagnosis not present

## 2020-12-21 LAB — BASIC METABOLIC PANEL
Anion gap: 8 (ref 5–15)
BUN: 49 mg/dL — ABNORMAL HIGH (ref 8–23)
CO2: 28 mmol/L (ref 22–32)
Calcium: 8.6 mg/dL — ABNORMAL LOW (ref 8.9–10.3)
Chloride: 102 mmol/L (ref 98–111)
Creatinine, Ser: 2.22 mg/dL — ABNORMAL HIGH (ref 0.44–1.00)
GFR, Estimated: 22 mL/min — ABNORMAL LOW (ref >60)
Glucose, Bld: 69 mg/dL — ABNORMAL LOW (ref 70–99)
Potassium: 3.4 mmol/L — ABNORMAL LOW (ref 3.5–5.1)
Sodium: 138 mmol/L (ref 135–145)

## 2020-12-21 LAB — CBC WITH DIFFERENTIAL/PLATELET
Abs Immature Granulocytes: 0.01 10*3/uL (ref 0.00–0.07)
Basophils Absolute: 0.1 10*3/uL (ref 0.0–0.1)
Basophils Relative: 1 %
Eosinophils Absolute: 0.1 10*3/uL (ref 0.0–0.5)
Eosinophils Relative: 2 %
HCT: 34.5 % — ABNORMAL LOW (ref 36.0–46.0)
Hemoglobin: 11.1 g/dL — ABNORMAL LOW (ref 12.0–15.0)
Immature Granulocytes: 0 %
Lymphocytes Relative: 21 %
Lymphs Abs: 1.5 10*3/uL (ref 0.7–4.0)
MCH: 26.7 pg (ref 26.0–34.0)
MCHC: 32.2 g/dL (ref 30.0–36.0)
MCV: 82.9 fL (ref 80.0–100.0)
Monocytes Absolute: 0.8 10*3/uL (ref 0.1–1.0)
Monocytes Relative: 11 %
Neutro Abs: 4.8 10*3/uL (ref 1.7–7.7)
Neutrophils Relative %: 65 %
Platelets: 267 10*3/uL (ref 150–400)
RBC: 4.16 MIL/uL (ref 3.87–5.11)
RDW: 15.3 % (ref 11.5–15.5)
WBC: 7.4 10*3/uL (ref 4.0–10.5)
nRBC: 0 % (ref 0.0–0.2)

## 2020-12-21 NOTE — Discharge Instructions (Addendum)
You have been seen and discharged from the emergency department.  Your blood work and MRI were normal tonight.  You need to follow-up with stroke neurologist, call to make an appointment.  Follow-up with your primary provider for reevaluation and further care.  Neurology strongly recommends that you have Holter monitor evaluation to rule out any arrhythmias that could be contributing to your symptoms.  Take home medications as prescribed. If you have any worsening symptoms or further concerns for your health please return to an emergency department for further evaluation.

## 2020-12-21 NOTE — ED Provider Notes (Signed)
[facility] EMERGENCY DEPARTMENT Provider Note   CSN: [id number] Arrival date & time: 12/21/20  1529     History Chief Complaint  Patient presents with   Extremity Weakness    Holly Baldwin is a 74 y.o. female.  HPI  75 year old female past medical history of HTN, HLD, DM presents the emergency department with concern for weakness in the right side of her face, arm and leg.  Patient has had the symptoms before.  Her most recent episode of this resulted in an admission where she was evaluated by stroke neurology.  Had a full TIA work-up.  Was just discharged a couple weeks ago with established outpatient stroke neurology follow-up.  Patient states today about 3 hours ago she had the same symptoms, self resolved.  Currently feels back to baseline.  No active headache.  She has otherwise been in her baseline health, compliant with her medications.  Past Medical History:  Diagnosis Date   Anxiety    Chronic abdominal pain    Diabetes mellitus    GERD (gastroesophageal reflux disease)    Hiatal hernia    Hypercholesteremia    Hypertension     Patient Active Problem List   Diagnosis Date Noted   TIA (transient ischemic attack) 12/08/2020   Hypercholesteremia    Hypertension    Chronic diastolic CHF (congestive heart failure) (HCC)    Acute on chronic diastolic CHF (congestive heart failure) ([place]) 08/21/2018   Abnormal liver function    Hypertensive emergency    Acute CHF (congestive heart failure) ([place]) 08/20/2018   Hypokalemia 08/20/2018   Anemia 08/20/2018   Acute renal failure superimposed on stage 3 chronic kidney disease ([place]) 08/20/2018   Acute CHF ([place]) 08/20/2018   Acute respiratory failure with hypoxia ([place])    Vitamin D deficiency 03/17/2017   Class 2 severe obesity due to excess calories with serious comorbidity and body mass index (BMI) of 38.0 to 38.9 in adult ([place]) 07/10/2015   Essential hypertension, benign 01/02/2015   Mixed hyperlipidemia 01/02/2015    Unspecified gastritis and gastroduodenitis without mention of hemorrhage 05/06/2012   Abdominal pain 05/05/2012   Nausea & vomiting 05/05/2012   Depression 05/05/2012   GERD (gastroesophageal reflux disease) 05/05/2012   DM2 (diabetes mellitus, type 2) ([place]) 05/05/2012   Primary hypothyroidism 05/05/2012   Hiatal hernia 05/05/2012   Malignant hypertension 05/05/2012   SHOULDER PAIN 03/25/2007   IMPINGEMENT SYNDROME 03/25/2007   RUPTURE ROTATOR CUFF 03/25/2007   HIGH BLOOD PRESSURE 03/25/2007    Past Surgical History:  Procedure Laterality Date   ABDOMINAL HYSTERECTOMY     CATARACT EXTRACTION W/PHACO Right 01/09/2020   Procedure: CATARACT EXTRACTION PHACO AND INTRAOCULAR LENS PLACEMENT ([place]);  Surgeon: Baruch Goldmann, MD;  Location: [facility];  Service: Ophthalmology;  Laterality: Right;  CDE: 12.01   CATARACT EXTRACTION W/PHACO Left 01/23/2020   Procedure: CATARACT EXTRACTION PHACO AND INTRAOCULAR LENS PLACEMENT LEFT EYE;  Surgeon: Baruch Goldmann, MD;  Location: [facility];  Service: Ophthalmology;  Laterality: Left;  CDE 8.71   COLONOSCOPY N/A 06/23/2012   Procedure: COLONOSCOPY;  Surgeon: Rogene Houston, MD;  Location: [facility];  Service: Endoscopy;  Laterality: N/A;  100-moved to 1200 Ann to notify pt   ESOPHAGOGASTRODUODENOSCOPY N/A 05/06/2012   Procedure: ESOPHAGOGASTRODUODENOSCOPY (EGD);  Surgeon: Rogene Houston, MD;  Location: [facility];  Service: Endoscopy;  Laterality: N/A;   MASS EXCISION Right 05/26/2012   Procedure: EXCISION NEOPLASM RIGHT THIGH;  Surgeon: Jamesetta So, MD;  Location: [facility];  Service: General;  Laterality: Right;     OB History   No obstetric history on file.     Family History  Problem Relation Age of Onset   Diabetes Mother    Diabetes Father    Stroke Father    Diabetes Sister    Diabetes Brother    Colon cancer Neg Hx    Colon polyps Neg Hx     Social History   Tobacco Use   Smoking status: Never   Smokeless tobacco: Never  Vaping  Use   Vaping Use: Never used  Substance Use Topics   Alcohol use: No   Drug use: No    Home Medications Prior to Admission medications   Medication Sig Start Date End Date Taking? Authorizing Provider  albuterol (PROVENTIL HFA;VENTOLIN HFA) 108 (90 Base) MCG/ACT inhaler Inhale 1-2 puffs into the lungs every 6 (six) hours as needed for wheezing or shortness of breath.     [provider]  aspirin EC 81 MG tablet Take 1 tablet (81 mg total) by mouth daily for 21 days. Swallow whole. 12/10/20 12/31/20  Lavina Hamman, MD  Blood Glucose Monitoring Suppl (ACCU-CHEK AVIVA PLUS) w/Device KIT  03/09/20   [provider]  calcitRIOL (ROCALTROL) 0.25 MCG capsule Take 0.25 mcg by mouth every Monday, Wednesday, and Friday. 11/28/20   [provider]  clopidogrel (PLAVIX) 75 MG tablet Take 1 tablet (75 mg total) by mouth daily. 12/10/20 12/10/21  Lavina Hamman, MD  furosemide (LASIX) 40 MG tablet Take 40 mg by mouth 2 (two) times daily. 08/30/20   [provider]  gabapentin (NEURONTIN) 100 MG capsule TAKE 1 CAPSULE TWICE DAILY Patient not taking: No sig reported 12/31/19   Cassandria Anger, MD  glipiZIDE (GLUCOTROL) 5 MG tablet Take 5 mg by mouth daily before breakfast.    [provider]  glucose blood (ACCU-CHEK AVIVA PLUS) test strip TEST BLOOD GLUCOSE FOUR TIMES DAILY 05/02/20   Cassandria Anger, MD  hydrALAZINE (APRESOLINE) 100 MG tablet Take 1 tablet (100 mg total) by mouth 3 (three) times daily. 08/25/18   Barton Dubois, MD  insulin lispro (HUMALOG) 100 UNIT/ML KwikPen Inject 10 Units into the skin 3 (three) times daily. With meals 04/04/20   Brita Romp, NP  isosorbide mononitrate (IMDUR) 60 MG 24 hr tablet Take 1 tablet (60 mg total) by mouth daily. 08/26/18   Barton Dubois, MD  LANTUS SOLOSTAR 100 UNIT/ML Solostar Pen INJECT 40 UNITS SUBCUTANEOUSLY AT 10PM. Patient taking differently: Inject 40 Units into the skin at bedtime. 09/10/20    Cassandria Anger, MD  levothyroxine (SYNTHROID) 137 MCG tablet TAKE 1 TABLET (137 MCG TOTAL) BY MOUTH DAILY BEFORE BREAKFAST. 12/20/19   Cassandria Anger, MD  lisinopril (ZESTRIL) 30 MG tablet Take 30 mg by mouth daily. 10/12/20   [provider]  NIFEdipine (PROCARDIA XL/NIFEDICAL XL) 60 MG 24 hr tablet Take 60 mg by mouth daily. 12/19/19   [provider]  simvastatin (ZOCOR) 40 MG tablet TAKE 1 TABLET EVERY DAY Patient taking differently: Take 40 mg by mouth daily. 10/05/17   Fayrene Helper, MD  ziprasidone (GEODON) 40 MG capsule Take 40 mg by mouth 2 (two) times daily with a meal. 10/27/20   [provider]    Allergies    Bee venom and Penicillins  Review of Systems   Review of Systems  Constitutional:  Negative for chills and fever.  HENT:  Negative for congestion.   Eyes:  Negative for visual disturbance.  Respiratory:  Negative for shortness of breath.   Cardiovascular:  Negative for chest pain.  Gastrointestinal:  Negative for abdominal pain, diarrhea and vomiting.  Genitourinary:  Negative for dysuria.  Skin:  Negative for rash.  Neurological:  Positive for weakness and numbness. Negative for dizziness, syncope, facial asymmetry, speech difficulty, light-headedness and headaches.   Physical Exam Updated Vital Signs BP (!) 178/62 (BP Location: Right Arm)   Pulse (!) 59   Temp 99.2 F (37.3 C) (Oral)   Resp 19   SpO2 100%   Physical Exam Vitals and nursing note reviewed.  Constitutional:      Appearance: Normal appearance.  HENT:     Head: Normocephalic.     Mouth/Throat:     Mouth: Mucous membranes are moist.  Eyes:     Pupils: Pupils are equal, round, and reactive to light.  Cardiovascular:     Rate and Rhythm: Normal rate.  Pulmonary:     Effort: Pulmonary effort is normal. No respiratory distress.  Abdominal:     Palpations: Abdomen is soft.     Tenderness: There is no abdominal tenderness.  Musculoskeletal:      Cervical back: No rigidity.  Skin:    General: Skin is warm.  Neurological:     General: No focal deficit present.     Mental Status: She is alert and oriented to person, place, and time. Mental status is at baseline.     Cranial Nerves: No cranial nerve deficit.     Motor: No weakness.  Psychiatric:        Mood and Affect: Mood normal.    ED Results / Procedures / Treatments   Labs (all labs ordered are listed, but only abnormal results are displayed) Labs Reviewed  CBC WITH DIFFERENTIAL/PLATELET  BASIC METABOLIC PANEL    EKG None  Radiology No results found.  Procedures Procedures   Medications Ordered in ED Medications - No data to display  ED Course  I have reviewed the triage vital signs and the nursing notes.  Pertinent labs & imaging results that were available during my care of the patient were reviewed by me and considered in my medical decision making (see chart for details).    MDM Rules/Calculators/A&P                           75 year old female presents with a resolved episode of right-sided weakness.  This is the same episodes that she is been having in the past, recently admitted for.  She had a full TIA work-up and stroke neurology evaluation.  They discharged her a little over a week ago for outpatient neurology stroke follow-up.  Is already on antiplatelet and statin therapy.  Neuro at that time was recommending permissive hypertension with systolics 1 76-5 60, that is where she is currently.  She is being monitored as an outpatient for HTN.  Patient is back to baseline from an alert neurologic standpoint.  Metabolic work-up is reassuring.  MRI shows no changes.  Plan to continue outpatient stroke neurology follow-up.  Advised that she follows up with her PCP and also Holter monitor to ensure that there is no paroxysmal A. fib that could be contributing to these episodes which would be a TIA risk.  She feels well, understands the importance of outpatient  follow-up.  Patient at this time appears safe and stable for discharge and will be treated as an outpatient.  Discharge  plan and strict return to ED precautions discussed, patient verbalizes understanding and agreement.  Final Clinical Impression(s) / ED Diagnoses Final diagnoses:  None    Rx / DC Orders ED Discharge Orders     None        Lorelle Gibbs, DO 12/21/20 2038

## 2020-12-21 NOTE — ED Notes (Signed)
Episode of right sided weakness onset 1330 today resolved on arrival, states she has had similar episodes in the past x 2 and told she was having mini  strokes

## 2020-12-21 NOTE — ED Notes (Signed)
ED Provider at bedside. 

## 2020-12-24 DIAGNOSIS — Z23 Encounter for immunization: Secondary | ICD-10-CM | POA: Diagnosis not present

## 2020-12-24 DIAGNOSIS — E1129 Type 2 diabetes mellitus with other diabetic kidney complication: Secondary | ICD-10-CM | POA: Diagnosis not present

## 2020-12-24 DIAGNOSIS — G459 Transient cerebral ischemic attack, unspecified: Secondary | ICD-10-CM | POA: Diagnosis not present

## 2020-12-24 DIAGNOSIS — N184 Chronic kidney disease, stage 4 (severe): Secondary | ICD-10-CM | POA: Diagnosis not present

## 2020-12-25 ENCOUNTER — Emergency Department (HOSPITAL_COMMUNITY): Payer: Medicare Other

## 2020-12-25 ENCOUNTER — Encounter (HOSPITAL_COMMUNITY): Payer: Self-pay | Admitting: *Deleted

## 2020-12-25 ENCOUNTER — Observation Stay (HOSPITAL_COMMUNITY)
Admission: EM | Admit: 2020-12-25 | Discharge: 2020-12-27 | Disposition: A | Discharge status: Home / Self Care | Payer: Medicare Other | Attending: Internal Medicine | Admitting: Internal Medicine

## 2020-12-25 ENCOUNTER — Other Ambulatory Visit: Payer: Self-pay

## 2020-12-25 DIAGNOSIS — N184 Chronic kidney disease, stage 4 (severe): Secondary | ICD-10-CM | POA: Diagnosis not present

## 2020-12-25 DIAGNOSIS — Z7982 Long term (current) use of aspirin: Secondary | ICD-10-CM | POA: Diagnosis not present

## 2020-12-25 DIAGNOSIS — I5033 Acute on chronic diastolic (congestive) heart failure: Secondary | ICD-10-CM | POA: Diagnosis not present

## 2020-12-25 DIAGNOSIS — Z7902 Long term (current) use of antithrombotics/antiplatelets: Secondary | ICD-10-CM | POA: Insufficient documentation

## 2020-12-25 DIAGNOSIS — M6281 Muscle weakness (generalized): Secondary | ICD-10-CM | POA: Insufficient documentation

## 2020-12-25 DIAGNOSIS — R9082 White matter disease, unspecified: Secondary | ICD-10-CM | POA: Diagnosis not present

## 2020-12-25 DIAGNOSIS — R2689 Other abnormalities of gait and mobility: Secondary | ICD-10-CM | POA: Insufficient documentation

## 2020-12-25 DIAGNOSIS — E1122 Type 2 diabetes mellitus with diabetic chronic kidney disease: Secondary | ICD-10-CM | POA: Insufficient documentation

## 2020-12-25 DIAGNOSIS — Z794 Long term (current) use of insulin: Secondary | ICD-10-CM | POA: Diagnosis not present

## 2020-12-25 DIAGNOSIS — E119 Type 2 diabetes mellitus without complications: Secondary | ICD-10-CM

## 2020-12-25 DIAGNOSIS — D631 Anemia in chronic kidney disease: Secondary | ICD-10-CM | POA: Diagnosis not present

## 2020-12-25 DIAGNOSIS — R2681 Unsteadiness on feet: Secondary | ICD-10-CM | POA: Insufficient documentation

## 2020-12-25 DIAGNOSIS — G459 Transient cerebral ischemic attack, unspecified: Secondary | ICD-10-CM | POA: Diagnosis not present

## 2020-12-25 DIAGNOSIS — Z20822 Contact with and (suspected) exposure to covid-19: Secondary | ICD-10-CM | POA: Diagnosis not present

## 2020-12-25 DIAGNOSIS — R531 Weakness: Secondary | ICD-10-CM | POA: Diagnosis present

## 2020-12-25 DIAGNOSIS — I6381 Other cerebral infarction due to occlusion or stenosis of small artery: Secondary | ICD-10-CM | POA: Diagnosis not present

## 2020-12-25 DIAGNOSIS — E039 Hypothyroidism, unspecified: Secondary | ICD-10-CM | POA: Insufficient documentation

## 2020-12-25 DIAGNOSIS — Z79899 Other long term (current) drug therapy: Secondary | ICD-10-CM | POA: Diagnosis not present

## 2020-12-25 DIAGNOSIS — I10 Essential (primary) hypertension: Secondary | ICD-10-CM | POA: Diagnosis not present

## 2020-12-25 DIAGNOSIS — I130 Hypertensive heart and chronic kidney disease with heart failure and stage 1 through stage 4 chronic kidney disease, or unspecified chronic kidney disease: Secondary | ICD-10-CM | POA: Insufficient documentation

## 2020-12-25 DIAGNOSIS — R5383 Other fatigue: Secondary | ICD-10-CM | POA: Diagnosis not present

## 2020-12-25 LAB — URINALYSIS, ROUTINE W REFLEX MICROSCOPIC
Bilirubin Urine: NEGATIVE
Glucose, UA: NEGATIVE mg/dL
Ketones, ur: NEGATIVE mg/dL
Leukocytes,Ua: NEGATIVE
Nitrite: NEGATIVE
Protein, ur: 100 mg/dL — AB
Specific Gravity, Urine: 1.006 (ref 1.005–1.030)
pH: 5 (ref 5.0–8.0)

## 2020-12-25 LAB — BASIC METABOLIC PANEL
Anion gap: 10 (ref 5–15)
BUN: 55 mg/dL — ABNORMAL HIGH (ref 8–23)
CO2: 25 mmol/L (ref 22–32)
Calcium: 8.9 mg/dL (ref 8.9–10.3)
Chloride: 106 mmol/L (ref 98–111)
Creatinine, Ser: 1.97 mg/dL — ABNORMAL HIGH (ref 0.44–1.00)
GFR, Estimated: 26 mL/min — ABNORMAL LOW (ref >60)
Glucose, Bld: 48 mg/dL — ABNORMAL LOW (ref 70–99)
Potassium: 3.4 mmol/L — ABNORMAL LOW (ref 3.5–5.1)
Sodium: 141 mmol/L (ref 135–145)

## 2020-12-25 LAB — RESP PANEL BY RT-PCR (FLU A&B, COVID) ARPGX2
Influenza A by PCR: NEGATIVE
Influenza B by PCR: NEGATIVE
SARS Coronavirus 2 by RT PCR: NEGATIVE

## 2020-12-25 LAB — CBC
HCT: 36.6 % (ref 36.0–46.0)
Hemoglobin: 11.6 g/dL — ABNORMAL LOW (ref 12.0–15.0)
MCH: 26.9 pg (ref 26.0–34.0)
MCHC: 31.7 g/dL (ref 30.0–36.0)
MCV: 84.7 fL (ref 80.0–100.0)
Platelets: 220 10*3/uL (ref 150–400)
RBC: 4.32 MIL/uL (ref 3.87–5.11)
RDW: 15.8 % — ABNORMAL HIGH (ref 11.5–15.5)
WBC: 8 10*3/uL (ref 4.0–10.5)
nRBC: 0 % (ref 0.0–0.2)

## 2020-12-25 LAB — GLUCOSE, CAPILLARY: Glucose-Capillary: 173 mg/dL — ABNORMAL HIGH (ref 70–99)

## 2020-12-25 LAB — CBG MONITORING, ED
Glucose-Capillary: 97 mg/dL (ref 70–99)
Glucose-Capillary: 107 mg/dL — ABNORMAL HIGH (ref 70–99)
Glucose-Capillary: 100 mg/dL — ABNORMAL HIGH (ref 70–99)

## 2020-12-25 MED ORDER — ACETAMINOPHEN 160 MG/5ML PO SOLN: 650.0000 mg | ORAL | Status: DC | PRN

## 2020-12-25 MED ORDER — SENNOSIDES-DOCUSATE SODIUM 8.6-50 MG PO TABS
1.0000 | ORAL_TABLET | Freq: Every evening | ORAL | Status: DC | PRN
  Administered 2020-12-26: 1 via ORAL
  Filled 2020-12-25: qty 1

## 2020-12-25 MED ORDER — INSULIN ASPART 100 UNIT/ML IJ SOLN
0.0000 [IU] | Freq: Three times a day (TID) | INTRAMUSCULAR | Status: DC
  Administered 2020-12-26: 2 [IU] via SUBCUTANEOUS
  Administered 2020-12-26: 3 [IU] via SUBCUTANEOUS
  Administered 2020-12-27: 2 [IU] via SUBCUTANEOUS
  Administered 2020-12-27: 3 [IU] via SUBCUTANEOUS

## 2020-12-25 MED ORDER — ACETAMINOPHEN 325 MG PO TABS
650.0000 mg | ORAL_TABLET | ORAL | Status: DC | PRN
  Administered 2020-12-26: 650 mg via ORAL
  Filled 2020-12-25: qty 2

## 2020-12-25 MED ORDER — INSULIN ASPART 100 UNIT/ML IJ SOLN: 0.0000 [IU] | Freq: Every day | INTRAMUSCULAR | Status: DC

## 2020-12-25 MED ORDER — INSULIN GLARGINE 100 UNIT/ML SOLOSTAR PEN: 40.0000 [IU] | PEN_INJECTOR | Freq: Every day | SUBCUTANEOUS | Status: DC

## 2020-12-25 MED ORDER — ACETAMINOPHEN 650 MG RE SUPP: 650.0000 mg | RECTAL | Status: DC | PRN

## 2020-12-25 MED ORDER — INSULIN GLARGINE-YFGN 100 UNIT/ML ~~LOC~~ SOLN
40.0000 [IU] | Freq: Every day | SUBCUTANEOUS | Status: DC
  Filled 2020-12-25: qty 0.4

## 2020-12-25 MED ORDER — ASPIRIN EC 81 MG PO TBEC
81.0000 mg | DELAYED_RELEASE_TABLET | Freq: Every day | ORAL | Status: DC
  Administered 2020-12-25 – 2020-12-27 (×3): 81 mg via ORAL
  Filled 2020-12-25 (×2): qty 1

## 2020-12-25 MED ORDER — SIMVASTATIN 20 MG PO TABS
40.0000 mg | ORAL_TABLET | Freq: Every day | ORAL | Status: DC
  Administered 2020-12-25 – 2020-12-27 (×3): 40 mg via ORAL
  Filled 2020-12-25 (×2): qty 2

## 2020-12-25 MED ORDER — CLOPIDOGREL BISULFATE 75 MG PO TABS
75.0000 mg | ORAL_TABLET | Freq: Every day | ORAL | Status: DC
  Administered 2020-12-25 – 2020-12-27 (×3): 75 mg via ORAL
  Filled 2020-12-25 (×2): qty 1

## 2020-12-25 MED ORDER — ENOXAPARIN SODIUM 40 MG/0.4ML IJ SOSY
40.0000 mg | PREFILLED_SYRINGE | INTRAMUSCULAR | Status: DC
  Administered 2020-12-25 – 2020-12-26 (×2): 40 mg via SUBCUTANEOUS
  Filled 2020-12-25 (×2): qty 0.4

## 2020-12-25 MED ORDER — NIFEDIPINE ER OSMOTIC RELEASE 30 MG PO TB24
60.0000 mg | ORAL_TABLET | Freq: Every day | ORAL | Status: DC
  Administered 2020-12-25 – 2020-12-27 (×3): 60 mg via ORAL
  Filled 2020-12-25 (×2): qty 2

## 2020-12-25 MED ORDER — STROKE: EARLY STAGES OF RECOVERY BOOK
Freq: Once | Status: AC
  Filled 2020-12-25: qty 1

## 2020-12-25 MED ORDER — HYDRALAZINE HCL 25 MG PO TABS
100.0000 mg | ORAL_TABLET | Freq: Three times a day (TID) | ORAL | Status: DC
  Administered 2020-12-26 – 2020-12-27 (×4): 100 mg via ORAL
  Filled 2020-12-25 (×4): qty 4

## 2020-12-25 NOTE — ED Triage Notes (Signed)
Right sided weakness onset 0300 today, spouse states this is the 5th trip for same symptoms

## 2020-12-25 NOTE — Plan of Care (Signed)
Patient has arrived on unit via stretcher alert and oriented no c/o pain or discomfort at this time.  Skin is intact on torso and all extremities, patient is on room air lungs CTA in all fields, continent of bowel and bladder and chooses to ambulate to bathroom with assistance x1. Patient has been oriented to room and has call bell and bedside table within reach.

## 2020-12-25 NOTE — H&P (Signed)
History and Physical    Holly Baldwin HBZ:[id number] DOB: [birth year] DOA: 12/25/2020  Referring MD/NP/PA: Marijean Bravo  PCP: Sharilyn Sites, MD  Outpatient Specialists: none  Patient coming from: Home   Chief Complaint: R sided weaknes   HPI: Holly Baldwin is a 75 y.o. female with medical history significant of type 2 diabetes mellitus, hypertension, hyperlipidemia, chronic diastolic heart failure, stage IV chronic kidney disease with baseline creatinine 1.9-2.3 presenting with right-sided weakness.  Patient reports having right-sided weakness since about 3 AM this morning.  Symptoms have been fairly intermittent.  Patient noted to have been recently admitted October 8 through October 10 for stroke work-up.  Patient also had right-sided numbness and weakness at that time.  Patient denies being previously admitted for similar symptoms.  Patient states she has intermittent right-sided weakness as well as numbness that affects face arm and the leg.  Was discharged on aspirin and Plavix.  Patient reports compliance with medication.  No chest pain or shortness of breath.  No fevers or chills.  No abdominal pain.  Patient states that her diabetes as well as high blood pressure is overall well controlled.  ED Course: Presented to the ER afebrile, hemodynamically stable.  Labs notable for hemoglobin of 11.6, creatinine of 1.9, potassium 3.4.  Per [place] Sous[place], [place], case was discussed with on-call neurology.  Recommendation was for imaging including MRI of the brain as well as MRA of the head and neck as well as an EEG.  Otherwise plan to continue aspirin and Plavix from discharge pending reevaluation by neurology in the morning.  Review of Systems: As per HPI otherwise 10 point review of systems negative.    Past Medical History:  Diagnosis Date   Anxiety    Chronic abdominal pain    Diabetes mellitus    GERD (gastroesophageal reflux disease)    Hiatal hernia    Hypercholesteremia    Hypertension     Past  Surgical History:  Procedure Laterality Date   ABDOMINAL HYSTERECTOMY     CATARACT EXTRACTION W/PHACO Right 01/09/2020   Procedure: CATARACT EXTRACTION PHACO AND INTRAOCULAR LENS PLACEMENT ([place]);  Surgeon: Baruch Goldmann, MD;  Location: [facility];  Service: Ophthalmology;  Laterality: Right;  CDE: 12.01   CATARACT EXTRACTION W/PHACO Left 01/23/2020   Procedure: CATARACT EXTRACTION PHACO AND INTRAOCULAR LENS PLACEMENT LEFT EYE;  Surgeon: Baruch Goldmann, MD;  Location: [facility];  Service: Ophthalmology;  Laterality: Left;  CDE 8.71   COLONOSCOPY N/A 06/23/2012   Procedure: COLONOSCOPY;  Surgeon: Rogene Houston, MD;  Location: [facility];  Service: Endoscopy;  Laterality: N/A;  100-moved to 1200 Ann to notify pt   ESOPHAGOGASTRODUODENOSCOPY N/A 05/06/2012   Procedure: ESOPHAGOGASTRODUODENOSCOPY (EGD);  Surgeon: Rogene Houston, MD;  Location: [facility];  Service: Endoscopy;  Laterality: N/A;   MASS EXCISION Right 05/26/2012   Procedure: EXCISION NEOPLASM RIGHT THIGH;  Surgeon: Jamesetta So, MD;  Location: [facility];  Service: General;  Laterality: Right;     reports that she has never smoked. She has never used smokeless tobacco. She reports that she does not drink alcohol and does not use drugs.  Allergies  Allergen Reactions   Bee Venom Anaphylaxis   Penicillins Hives    .Has patient had a PCN reaction causing immediate rash, facial/tongue/throat swelling, SOB or lightheadedness with hypotension: Yes Has patient had a PCN reaction causing severe rash involving mucus membranes or skin necrosis: No Has patient had a PCN reaction that required hospitalization: Yes Has patient had  a PCN reaction occurring within the last 10 years: No If all of the above answers are "NO", then may proceed with Cephalosporin use.     Family History  Problem Relation Age of Onset   Diabetes Mother    Diabetes Father    Stroke Father    Diabetes Sister    Diabetes Brother    Colon cancer Neg Hx    Colon  polyps Neg Hx     Prior to Admission medications   Medication Sig Start Date End Date Taking? Authorizing Provider  albuterol (PROVENTIL HFA;VENTOLIN HFA) 108 (90 Base) MCG/ACT inhaler Inhale 1-2 puffs into the lungs every 6 (six) hours as needed for wheezing or shortness of breath.     [provider]  aspirin EC 81 MG tablet Take 1 tablet (81 mg total) by mouth daily for 21 days. Swallow whole. 12/10/20 12/31/20  Lavina Hamman, MD  Blood Glucose Monitoring Suppl (ACCU-CHEK AVIVA PLUS) w/Device KIT  03/09/20   [provider]  calcitRIOL (ROCALTROL) 0.25 MCG capsule Take 0.25 mcg by mouth every Monday, Wednesday, and Friday. 11/28/20   [provider]  clopidogrel (PLAVIX) 75 MG tablet Take 1 tablet (75 mg total) by mouth daily. 12/10/20 12/10/21  Lavina Hamman, MD  furosemide (LASIX) 40 MG tablet Take 40 mg by mouth 2 (two) times daily. 08/30/20   [provider]  gabapentin (NEURONTIN) 100 MG capsule TAKE 1 CAPSULE TWICE DAILY Patient not taking: No sig reported 12/31/19   Cassandria Anger, MD  glipiZIDE (GLUCOTROL) 5 MG tablet Take 5 mg by mouth daily before breakfast.    [provider]  glucose blood (ACCU-CHEK AVIVA PLUS) test strip TEST BLOOD GLUCOSE FOUR TIMES DAILY 05/02/20   Cassandria Anger, MD  hydrALAZINE (APRESOLINE) 100 MG tablet Take 1 tablet (100 mg total) by mouth 3 (three) times daily. Patient taking differently: Take 100 mg by mouth with breakfast, with lunch, and with evening meal. 08/25/18   Barton Dubois, MD  insulin lispro (HUMALOG) 100 UNIT/ML KwikPen Inject 10 Units into the skin 3 (three) times daily. With meals 04/04/20   Brita Romp, NP  isosorbide mononitrate (IMDUR) 60 MG 24 hr tablet Take 1 tablet (60 mg total) by mouth daily. 08/26/18   Barton Dubois, MD  LANTUS SOLOSTAR 100 UNIT/ML Solostar Pen INJECT 40 UNITS SUBCUTANEOUSLY AT 10PM. Patient taking differently: Inject 40 Units into the skin at bedtime.  09/10/20   Cassandria Anger, MD  levothyroxine (SYNTHROID) 137 MCG tablet TAKE 1 TABLET (137 MCG TOTAL) BY MOUTH DAILY BEFORE BREAKFAST. 12/20/19   Cassandria Anger, MD  lisinopril (ZESTRIL) 30 MG tablet Take 30 mg by mouth daily. 10/12/20   [provider]  NIFEdipine (PROCARDIA XL/NIFEDICAL XL) 60 MG 24 hr tablet Take 60 mg by mouth daily. 12/19/19   [provider]  simvastatin (ZOCOR) 40 MG tablet TAKE 1 TABLET EVERY DAY Patient taking differently: Take 40 mg by mouth daily. 10/05/17   Fayrene Helper, MD  ziprasidone (GEODON) 40 MG capsule Take 40 mg by mouth at bedtime. 10/27/20   [provider]    Physical Exam: Vitals:   12/25/20 1638 12/25/20 1645 12/25/20 1800 12/25/20 1830  BP:  (!) 159/66 (!) 158/68 (!) 166/69  Pulse:  64 (!) 57 (!) 57  Resp:  _0 Temp:      TempSrc:      SpO2:  100% 97% 98%  Weight: 92.5 kg  Height: 5' 4" (1.626 m)         Constitutional: NAD, calm, comfortable Vitals:   12/25/20 1638 12/25/20 1645 12/25/20 1800 12/25/20 1830  BP:  (!) 159/66 (!) 158/68 (!) 166/69  Pulse:  64 (!) 57 (!) 57  Resp:  _0 Temp:      TempSrc:      SpO2:  100% 97% 98%  Weight: 92.5 kg     Height: 5' 4" (1.626 m)      Gen: NAD, obese Eyes: PERRL, lids and conjunctivae normal ENMT: Mucous membranes are moist. Posterior pharynx clear of any exudate or lesions.Normal dentition.  Neck: normal, supple, no masses, no thyromegaly Respiratory: clear to auscultation bilaterally, no wheezing, no crackles. Normal respiratory effort. No accessory muscle use.  Cardiovascular: Regular rate and rhythm, no murmurs / rubs / gallops. No extremity edema. 2+ pedal pulses. No carotid bruits.  Abdomen: no tenderness, no masses palpated. No hepatosplenomegaly. Bowel sounds positive.  Musculoskeletal: no clubbing / cyanosis. No joint deformity upper and lower extremities. Good ROM, no contractures. Mild RUE weakness .  Skin: no rashes,  lesions, ulcers. No induration Neurologic: CN 2-12 grossly intact-trace to minimal R sided facial droop. Sensation intact, DTR normal. Strength 5/5 in all 4.  Psychiatric: Normal judgment and insight. Alert and oriented x 3. Normal mood.   Labs on Admission: I have personally reviewed following labs and imaging studies  CBC: Recent Labs  Lab 12/21/20 1715 12/25/20 1202  WBC 7.4 8.0  NEUTROABS 4.8  --   HGB 11.1* 11.6*  HCT 34.5* 36.6  MCV 82.9 84.7  PLT 267 388   Basic Metabolic Panel: Recent Labs  Lab 12/21/20 1715 12/25/20 1202  NA 138 141  K 3.4* 3.4*  CL 102 106  CO2 28 25  GLUCOSE 69* 48*  BUN 49* 55*  CREATININE 2.22* 1.97*  CALCIUM 8.6* 8.9   GFR: Estimated Creatinine Clearance: 26.8 mL/min (A) (by C-G formula based on SCr of 1.97 mg/dL (H)). Liver Function Tests: No results for input(s): AST, ALT, ALKPHOS, BILITOT, PROT, ALBUMIN in the last 168 hours. No results for input(s): LIPASE, AMYLASE in the last 168 hours. No results for input(s): AMMONIA in the last 168 hours. Coagulation Profile: No results for input(s): INR, PROTIME in the last 168 hours. Cardiac Enzymes: No results for input(s): CKTOTAL, CKMB, CKMBINDEX, TROPONINI in the last 168 hours. BNP (last 3 results) No results for input(s): PROBNP in the last 8760 hours. HbA1C: No results for input(s): HGBA1C in the last 72 hours. CBG: Recent Labs  Lab 12/25/20 1406 12/25/20 1601 12/25/20 1857  GLUCAP 97 107* 100*   Lipid Profile: No results for input(s): CHOL, HDL, LDLCALC, TRIG, CHOLHDL, LDLDIRECT in the last 72 hours. Thyroid Function Tests: No results for input(s): TSH, T4TOTAL, FREET4, T3FREE, THYROIDAB in the last 72 hours. Anemia Panel: No results for input(s): VITAMINB12, FOLATE, FERRITIN, TIBC, IRON, RETICCTPCT in the last 72 hours. Urine analysis:    Component Value Date/Time   COLORURINE STRAW (A) 12/25/2020 1420   APPEARANCEUR CLEAR 12/25/2020 1420   LABSPEC 1.006 12/25/2020 1420    PHURINE 5.0 12/25/2020 1420   GLUCOSEU NEGATIVE 12/25/2020 1420   HGBUR SMALL (A) 12/25/2020 1420   BILIRUBINUR NEGATIVE 12/25/2020 1420   KETONESUR NEGATIVE 12/25/2020 1420   PROTEINUR 100 (A) 12/25/2020 1420   UROBILINOGEN 1.0 10/10/2014 1120   NITRITE NEGATIVE 12/25/2020 1420   LEUKOCYTESUR NEGATIVE 12/25/2020 1420   Sepsis Labs: _1 (procalcitonin:4,lacticidven:4) )No results found for this or  any previous visit (from the past 240 hour(s)).   Radiological Exams on Admission: CT Head Wo Contrast  Result Date: 12/25/2020 CLINICAL DATA:  Fatigue and numbness in all extremities. EXAM: CT HEAD WITHOUT CONTRAST TECHNIQUE: Contiguous axial images were obtained from the base of the skull through the vertex without intravenous contrast. COMPARISON:  Brain MRI 12/21/2020.  Head CT 12/08/2020 FINDINGS: Brain: There is no evidence for acute hemorrhage, hydrocephalus, mass lesion, or abnormal extra-axial fluid collection. No definite CT evidence for acute infarction. Diffuse loss of parenchymal volume is consistent with atrophy. Patchy low attenuation in the deep hemispheric and periventricular white matter is nonspecific, but likely reflects chronic microvascular ischemic demyelination. Lacunar infarcts noted left basal ganglia. Vascular: No hyperdense vessel or unexpected calcification. Skull: No evidence for fracture. No worrisome lytic or sclerotic lesion. Sinuses/Orbits: The visualized paranasal sinuses and mastoid air cells are clear. Visualized portions of the globes and intraorbital fat are unremarkable. Other: None. IMPRESSION: Stable.  No acute intracranial abnormality. Atrophy with chronic small vessel white matter ischemic disease. Electronically Signed   By: Misty Stanley M.D.   On: 12/25/2020 17:02    EKG: Independently reviewed. SR w/ 1st deg AV block   Assessment/Plan Active Problems:   TIA (transient ischemic attack)    1-TIA -Recurrent issue with noted recent admission  for similar issues October 8 through 10 - Plan for repeat neuroimaging per telemetry neuro recs including MRI of the brain and MRA of the head and neck as well as repeat EEG - We will defer carotid ultrasound and 2D echo to daytime neurology recommendations in the interim - Continue with aspirin and Plavix - PT/OT evaluation - Follow  2-type 2 diabetes - A1c 6.8 on 12/09/2020 - Sliding scale insulin  3-stage III CKD - Baseline creatinine around 1.9-2.2 -Creatinine 1.9 today - At baseline - Follow  4-Chronic diastolic CHF -appears euvolemic -cont home regimen   4-HTN  -BP stable  -titrate home regimen   DVT prophylaxis: ppx lovenox   Code Status: Full Code   Family Communication: No family at bedside  Disposition Plan: Anticipate 1-2 [place] stay  Consults called: Neurology per ED PA  Admission status: Obs    Deneise Lever MD [facility] Pager [phone]   If 7PM-7AM, please contact night-coverage [email or web address] Password [email or web address]  12/25/2020, 7:40 PM

## 2020-12-25 NOTE — ED Notes (Signed)
  Pt transported to ct 

## 2020-12-25 NOTE — ED Notes (Signed)
Pt given 2 8oz orange juices

## 2020-12-25 NOTE — ED Provider Notes (Signed)
[facility] EMERGENCY DEPARTMENT Provider Note   CSN: [id number] Arrival date & time: 12/25/20  1114     History No chief complaint on file.   Holly Baldwin is a 75 y.o. female.  HPI   Pt is a 75 y/o female with a h/o anxiety, chronic abd pain, DM, GERD, hiatal hernia, HLD, HTN, who presents to the ED for eval of right sided weakness that started at 3:00 AM. This included the RUE And RLE. She further reports some visual changes to the right eye. Denies facial involvement. States this episode last for about 20 minutes while she was using the restroom. She has had similar episodes in the past and has been seen several times in the ED for sxs. She recently had a full TIA w/u and also had and MRI a few days ago that was negative.   Denies any recent head trauma. Denies chest pain or sob  Past Medical History:  Diagnosis Date   Anxiety    Chronic abdominal pain    Diabetes mellitus    GERD (gastroesophageal reflux disease)    Hiatal hernia    Hypercholesteremia    Hypertension     Patient Active Problem List   Diagnosis Date Noted   TIA (transient ischemic attack) 12/08/2020   Hypercholesteremia    Hypertension    Chronic diastolic CHF (congestive heart failure) (HCC)    Acute on chronic diastolic CHF (congestive heart failure) ([place]) 08/21/2018   Abnormal liver function    Hypertensive emergency    Acute CHF (congestive heart failure) ([place]) 08/20/2018   Hypokalemia 08/20/2018   Anemia 08/20/2018   Acute renal failure superimposed on stage 3 chronic kidney disease ([place]) 08/20/2018   Acute CHF ([place]) 08/20/2018   Acute respiratory failure with hypoxia ([place])    Vitamin D deficiency 03/17/2017   Class 2 severe obesity due to excess calories with serious comorbidity and body mass index (BMI) of 38.0 to 38.9 in adult ([place]) 07/10/2015   Essential hypertension, benign 01/02/2015   Mixed hyperlipidemia 01/02/2015   Unspecified gastritis and gastroduodenitis without mention of  hemorrhage 05/06/2012   Abdominal pain 05/05/2012   Nausea & vomiting 05/05/2012   Depression 05/05/2012   GERD (gastroesophageal reflux disease) 05/05/2012   DM2 (diabetes mellitus, type 2) ([place]) 05/05/2012   Primary hypothyroidism 05/05/2012   Hiatal hernia 05/05/2012   Malignant hypertension 05/05/2012   SHOULDER PAIN 03/25/2007   IMPINGEMENT SYNDROME 03/25/2007   RUPTURE ROTATOR CUFF 03/25/2007   HIGH BLOOD PRESSURE 03/25/2007    Past Surgical History:  Procedure Laterality Date   ABDOMINAL HYSTERECTOMY     CATARACT EXTRACTION W/PHACO Right 01/09/2020   Procedure: CATARACT EXTRACTION PHACO AND INTRAOCULAR LENS PLACEMENT (IOC);  Surgeon: Baruch Goldmann, MD;  Location: [facility];  Service: Ophthalmology;  Laterality: Right;  CDE: 12.01   CATARACT EXTRACTION W/PHACO Left 01/23/2020   Procedure: CATARACT EXTRACTION PHACO AND INTRAOCULAR LENS PLACEMENT LEFT EYE;  Surgeon: Baruch Goldmann, MD;  Location: [facility];  Service: Ophthalmology;  Laterality: Left;  CDE 8.71   COLONOSCOPY N/A 06/23/2012   Procedure: COLONOSCOPY;  Surgeon: Rogene Houston, MD;  Location: [facility];  Service: Endoscopy;  Laterality: N/A;  100-moved to 1200 Ann to notify pt   ESOPHAGOGASTRODUODENOSCOPY N/A 05/06/2012   Procedure: ESOPHAGOGASTRODUODENOSCOPY (EGD);  Surgeon: Rogene Houston, MD;  Location: [facility];  Service: Endoscopy;  Laterality: N/A;   MASS EXCISION Right 05/26/2012   Procedure: EXCISION NEOPLASM RIGHT THIGH;  Surgeon: Jamesetta So, MD;  Location: [facility];  Service: General;  Laterality: Right;     OB History   No obstetric history on file.     Family History  Problem Relation Age of Onset   Diabetes Mother    Diabetes Father    Stroke Father    Diabetes Sister    Diabetes Brother    Colon cancer Neg Hx    Colon polyps Neg Hx     Social History   Tobacco Use   Smoking status: Never   Smokeless tobacco: Never  Vaping Use   Vaping Use: Never used  Substance Use Topics    Alcohol use: No   Drug use: No    Home Medications Prior to Admission medications   Medication Sig Start Date End Date Taking? Authorizing Provider  albuterol (PROVENTIL HFA;VENTOLIN HFA) 108 (90 Base) MCG/ACT inhaler Inhale 1-2 puffs into the lungs every 6 (six) hours as needed for wheezing or shortness of breath.     [provider]  aspirin EC 81 MG tablet Take 1 tablet (81 mg total) by mouth daily for 21 days. Swallow whole. 12/10/20 12/31/20  Lavina Hamman, MD  Blood Glucose Monitoring Suppl (ACCU-CHEK AVIVA PLUS) w/Device KIT  03/09/20   [provider]  calcitRIOL (ROCALTROL) 0.25 MCG capsule Take 0.25 mcg by mouth every Monday, Wednesday, and Friday. 11/28/20   [provider]  clopidogrel (PLAVIX) 75 MG tablet Take 1 tablet (75 mg total) by mouth daily. 12/10/20 12/10/21  Lavina Hamman, MD  furosemide (LASIX) 40 MG tablet Take 40 mg by mouth 2 (two) times daily. 08/30/20   [provider]  gabapentin (NEURONTIN) 100 MG capsule TAKE 1 CAPSULE TWICE DAILY Patient not taking: No sig reported 12/31/19   Cassandria Anger, MD  glipiZIDE (GLUCOTROL) 5 MG tablet Take 5 mg by mouth daily before breakfast.    [provider]  glucose blood (ACCU-CHEK AVIVA PLUS) test strip TEST BLOOD GLUCOSE FOUR TIMES DAILY 05/02/20   Cassandria Anger, MD  hydrALAZINE (APRESOLINE) 100 MG tablet Take 1 tablet (100 mg total) by mouth 3 (three) times daily. Patient taking differently: Take 100 mg by mouth with breakfast, with lunch, and with evening meal. 08/25/18   Barton Dubois, MD  insulin lispro (HUMALOG) 100 UNIT/ML KwikPen Inject 10 Units into the skin 3 (three) times daily. With meals 04/04/20   Brita Romp, NP  isosorbide mononitrate (IMDUR) 60 MG 24 hr tablet Take 1 tablet (60 mg total) by mouth daily. 08/26/18   Barton Dubois, MD  LANTUS SOLOSTAR 100 UNIT/ML Solostar Pen INJECT 40 UNITS SUBCUTANEOUSLY AT 10PM. Patient taking differently: Inject 40  Units into the skin at bedtime. 09/10/20   Cassandria Anger, MD  levothyroxine (SYNTHROID) 137 MCG tablet TAKE 1 TABLET (137 MCG TOTAL) BY MOUTH DAILY BEFORE BREAKFAST. 12/20/19   Cassandria Anger, MD  lisinopril (ZESTRIL) 30 MG tablet Take 30 mg by mouth daily. 10/12/20   [provider]  NIFEdipine (PROCARDIA XL/NIFEDICAL XL) 60 MG 24 hr tablet Take 60 mg by mouth daily. 12/19/19   [provider]  simvastatin (ZOCOR) 40 MG tablet TAKE 1 TABLET EVERY DAY Patient taking differently: Take 40 mg by mouth daily. 10/05/17   Fayrene Helper, MD  ziprasidone (GEODON) 40 MG capsule Take 40 mg by mouth at bedtime. 10/27/20   [provider]    Allergies    Bee venom and Penicillins  Review of Systems   Review of Systems  Constitutional:  Negative for chills and fever.  HENT:  Negative for ear pain and sore throat.   Eyes:  Negative for pain and visual disturbance.  Respiratory:  Negative for cough and shortness of breath.   Cardiovascular:  Negative for chest pain.  Gastrointestinal:  Negative for abdominal pain, constipation, diarrhea, nausea and vomiting.  Genitourinary:  Negative for dysuria and hematuria.  Musculoskeletal:  Negative for back pain.  Skin:  Negative for color change and rash.  Neurological:  Positive for weakness and numbness.  All other systems reviewed and are negative.  Physical Exam Updated Vital Signs BP (!) 158/68   Pulse (!) 57   Temp 98.4 F (36.9 C) (Oral)   Resp 13   Ht _0  (1.626 m)   Wt 92.5 kg   SpO2 97%   BMI 35.02 kg/m   Physical Exam Vitals and nursing note reviewed.  Constitutional:      General: She is not in acute distress.    Appearance: She is well-developed.  HENT:     Head: Normocephalic and atraumatic.  Eyes:     Extraocular Movements: Extraocular movements intact.     Conjunctiva/sclera: Conjunctivae normal.     Pupils: Pupils are equal, round, and reactive to light.  Cardiovascular:     Rate  and Rhythm: Normal rate and regular rhythm.     Heart sounds: Normal heart sounds. No murmur heard. Pulmonary:     Effort: Pulmonary effort is normal. No respiratory distress.     Breath sounds: Normal breath sounds. No wheezing, rhonchi or rales.  Abdominal:     General: Bowel sounds are normal.     Palpations: Abdomen is soft.     Tenderness: There is no abdominal tenderness. There is no guarding or rebound.  Musculoskeletal:     Cervical back: Neck supple.  Skin:    General: Skin is warm and dry.  Neurological:     Mental Status: She is alert.     Comments: Mental Status:  Alert, thought content appropriate, able to give a coherent history. Speech fluent without evidence of aphasia. Able to follow 2 step commands without difficulty.  Cranial Nerves:  II:  pupils equal, round, reactive to light III,IV, VI: ptosis not present, extra-ocular motions intact bilaterally  V,VII: smile symmetric, facial light touch sensation equal VIII: hearing grossly normal to voice  X: uvula elevates symmetrically  XI: bilateral shoulder shrug symmetric and strong XII: midline tongue extension without fassiculations Motor:  Normal tone. 5/5 strength of BUE and BLE major muscle groups including strong and equal grip strength and dorsiflexion/plantar flexion Sensory: light touch normal in all extremities.     ED Results / Procedures / Treatments   Labs (all labs ordered are listed, but only abnormal results are displayed) Labs Reviewed  BASIC METABOLIC PANEL - Abnormal; Notable for the following components:      Result Value   Potassium 3.4 (*)    Glucose, Bld 48 (*)    BUN 55 (*)    Creatinine, Ser 1.97 (*)    GFR, Estimated 26 (*)    All other components within normal limits  CBC - Abnormal; Notable for the following components:   Hemoglobin 11.6 (*)    RDW 15.8 (*)    All other components within normal limits  URINALYSIS, ROUTINE W REFLEX MICROSCOPIC - Abnormal; Notable for the following  components:   Color, Urine STRAW (*)    Hgb urine dipstick SMALL (*)    Protein, ur 100 (*)  Bacteria, UA RARE (*)    All other components within normal limits  CBG MONITORING, ED - Abnormal; Notable for the following components:   Glucose-Capillary 107 (*)    All other components within normal limits  RESP PANEL BY RT-PCR (FLU A&B, COVID) [id number]  HEMOGLOBIN A1C  LIPID PANEL  CBC  CREATININE, SERUM  COMPREHENSIVE METABOLIC PANEL  CBG MONITORING, ED    EKG None  Radiology CT Head Wo Contrast  Result Date: 12/25/2020 CLINICAL DATA:  Fatigue and numbness in all extremities. EXAM: CT HEAD WITHOUT CONTRAST TECHNIQUE: Contiguous axial images were obtained from the base of the skull through the vertex without intravenous contrast. COMPARISON:  Brain MRI 12/21/2020.  Head CT 12/08/2020 FINDINGS: Brain: There is no evidence for acute hemorrhage, hydrocephalus, mass lesion, or abnormal extra-axial fluid collection. No definite CT evidence for acute infarction. Diffuse loss of parenchymal volume is consistent with atrophy. Patchy low attenuation in the deep hemispheric and periventricular white matter is nonspecific, but likely reflects chronic microvascular ischemic demyelination. Lacunar infarcts noted left basal ganglia. Vascular: No hyperdense vessel or unexpected calcification. Skull: No evidence for fracture. No worrisome lytic or sclerotic lesion. Sinuses/Orbits: The visualized paranasal sinuses and mastoid air cells are clear. Visualized portions of the globes and intraorbital fat are unremarkable. Other: None. IMPRESSION: Stable.  No acute intracranial abnormality. Atrophy with chronic small vessel white matter ischemic disease. Electronically Signed   By: Misty Stanley M.D.   On: 12/25/2020 17:02    Procedures Procedures   Medications Ordered in ED Medications   stroke: mapping our early stages of recovery book (has no administration in time range)  acetaminophen (TYLENOL) tablet  650 mg (has no administration in time range)    Or  acetaminophen (TYLENOL) 160 MG/5ML solution 650 mg (has no administration in time range)    Or  acetaminophen (TYLENOL) suppository 650 mg (has no administration in time range)  senna-docusate (Senokot-S) tablet 1 tablet (has no administration in time range)  enoxaparin (LOVENOX) injection 40 mg (has no administration in time range)    ED Course  I have reviewed the triage vital signs and the nursing notes.  Pertinent labs & imaging results that were available during my care of the patient were reviewed by me and considered in my medical decision making (see chart for details).    MDM Rules/Calculators/A&P                          75 y/o F presenting for eval of right sided numbness that occurred for about 20 minutes.   Reviewed/intepreted labs CBC with mild anemia, stable BMP with mild hypokalemia. Glucose low at 48, elevated bun/cr at baseline CBG rechecked after pt given po fluids and CBG improved to 97  EKG with nsr with 1st degree av block, twave abnormality, consider inferior ischemia, no stemi  Reviewed/interpreted imaging CT head - Stable.  No acute intracranial abnormality. Atrophy with chronic small vessel white matter ischemic disease.   Pt states she was asymptomatic with her hypoglycemia here. She is unsure if she was hypoglycemia during her previous episode. She did not check her sugar at that time however it was noted be around 500 when her husband checked it at 8am. Unclear if this is contributing to sxs. She recently did have a full TIA w/u earlier this month.   6:25 PM CONSULT with Dr. Quinn Axe with neurology who has concern for crescendo TIAs. Recommends admission. Recommends MRI brain, MRA head/neck,  EEG, and continuation of ASA and plavix. Recommends neuro consult tomorrow.  6:56 PM CONSULT with Dr. Ernestina Patches who accepts patient for admission    Final Clinical Impression(s) / ED Diagnoses Final diagnoses:  TIA  (transient ischemic attack)    Rx / DC Orders ED Discharge Orders     None        Rodney Booze, PA-C 12/25/20 1858    Lorelle Gibbs, DO 12/26/20 8184

## 2020-12-25 NOTE — ED Notes (Signed)
Pt states that she checked her cbg this morning and it was 522. Husband states that he gave her 10 units of novolog at 8 am and it came down to 200s. Pt then started complaining of feeling generally "bad all over" and felt tingling down all extremities and severe fatigue. CBG rechecked after two cups of juice given and noted to be 97. States she feels much better at this time. IV access obtained, and pt placed on cardiac monitoring. Pt alert and oriented x4

## 2020-12-26 ENCOUNTER — Observation Stay (HOSPITAL_COMMUNITY)
Admit: 2020-12-26 | Discharge: 2020-12-26 | Disposition: A | Discharge status: Home / Self Care | Payer: Medicare Other | Attending: Emergency Medicine | Admitting: Emergency Medicine

## 2020-12-26 ENCOUNTER — Observation Stay (HOSPITAL_COMMUNITY): Payer: Medicare Other

## 2020-12-26 ENCOUNTER — Encounter (HOSPITAL_COMMUNITY): Payer: Self-pay | Admitting: Family Medicine

## 2020-12-26 ENCOUNTER — Other Ambulatory Visit (HOSPITAL_COMMUNITY): Payer: Self-pay | Admitting: Radiology

## 2020-12-26 DIAGNOSIS — R569 Unspecified convulsions: Secondary | ICD-10-CM | POA: Diagnosis not present

## 2020-12-26 DIAGNOSIS — E162 Hypoglycemia, unspecified: Secondary | ICD-10-CM

## 2020-12-26 DIAGNOSIS — R413 Other amnesia: Secondary | ICD-10-CM | POA: Diagnosis not present

## 2020-12-26 DIAGNOSIS — R29818 Other symptoms and signs involving the nervous system: Secondary | ICD-10-CM | POA: Diagnosis not present

## 2020-12-26 DIAGNOSIS — G8191 Hemiplegia, unspecified affecting right dominant side: Secondary | ICD-10-CM | POA: Diagnosis not present

## 2020-12-26 DIAGNOSIS — Z794 Long term (current) use of insulin: Secondary | ICD-10-CM | POA: Diagnosis not present

## 2020-12-26 DIAGNOSIS — G459 Transient cerebral ischemic attack, unspecified: Secondary | ICD-10-CM | POA: Diagnosis not present

## 2020-12-26 DIAGNOSIS — E119 Type 2 diabetes mellitus without complications: Secondary | ICD-10-CM | POA: Diagnosis not present

## 2020-12-26 DIAGNOSIS — N184 Chronic kidney disease, stage 4 (severe): Secondary | ICD-10-CM

## 2020-12-26 LAB — COMPREHENSIVE METABOLIC PANEL
ALT: 13 U/L (ref 0–44)
AST: 12 U/L — ABNORMAL LOW (ref 15–41)
Albumin: 3.4 g/dL — ABNORMAL LOW (ref 3.5–5.0)
Alkaline Phosphatase: 100 U/L (ref 38–126)
Anion gap: 10 (ref 5–15)
BUN: 41 mg/dL — ABNORMAL HIGH (ref 8–23)
CO2: 25 mmol/L (ref 22–32)
Calcium: 8.6 mg/dL — ABNORMAL LOW (ref 8.9–10.3)
Chloride: 109 mmol/L (ref 98–111)
Creatinine, Ser: 1.74 mg/dL — ABNORMAL HIGH (ref 0.44–1.00)
GFR, Estimated: 30 mL/min — ABNORMAL LOW (ref >60)
Glucose, Bld: 119 mg/dL — ABNORMAL HIGH (ref 70–99)
Potassium: 3.6 mmol/L (ref 3.5–5.1)
Sodium: 144 mmol/L (ref 135–145)
Total Bilirubin: 0.3 mg/dL (ref 0.3–1.2)
Total Protein: 6.5 g/dL (ref 6.5–8.1)

## 2020-12-26 LAB — GLUCOSE, CAPILLARY
Glucose-Capillary: 140 mg/dL — ABNORMAL HIGH (ref 70–99)
Glucose-Capillary: 105 mg/dL — ABNORMAL HIGH (ref 70–99)
Glucose-Capillary: 183 mg/dL — ABNORMAL HIGH (ref 70–99)
Glucose-Capillary: 119 mg/dL — ABNORMAL HIGH (ref 70–99)

## 2020-12-26 MED ORDER — GADOBUTROL 1 MMOL/ML IV SOLN
10.0000 mL | Freq: Once | INTRAVENOUS | Status: AC | PRN
  Administered 2020-12-26: 10 mL via INTRAVENOUS

## 2020-12-26 MED ORDER — INSULIN GLARGINE-YFGN 100 UNIT/ML ~~LOC~~ SOLN
10.0000 [IU] | Freq: Every day | SUBCUTANEOUS | Status: DC
  Administered 2020-12-26: 10 [IU] via SUBCUTANEOUS
  Filled 2020-12-26 (×2): qty 0.1

## 2020-12-26 NOTE — Care Management Obs Status (Signed)
[place] NOTIFICATION   Patient Details  Name: Holly Baldwin MRN: [id number] Date of Birth: 04/04/1945   Medicare Observation Status Notification Given:  Yes    Tommy Medal 12/26/2020, 6:55 PM

## 2020-12-26 NOTE — Progress Notes (Addendum)
Inpatient Diabetes Program Recommendations  AACE/ADA: New Consensus Statement on Inpatient Glycemic Control   Target Ranges:  Prepandial:   less than 140 mg/dL      Peak postprandial:   less than 180 mg/dL (1-2 hours)      Critically ill patients:  140 - 180 mg/dL   Results for Holly Baldwin, Holly Baldwin (MRN [id number]) as of 12/26/2020 11:34  Ref. Range 12/25/2020 14:06 12/25/2020 16:01 12/25/2020 18:57 12/25/2020 20:57 12/26/2020 07:36  Glucose-Capillary Latest Ref Range: 70 - 99 mg/dL 97 107 (H) 100 (H) 173 (H) 140 (H)   Results for Holly Baldwin, Holly Baldwin (MRN [id number]) as of 12/26/2020 11:34  Ref. Range 12/21/2020 17:15 12/25/2020 12:02  Glucose Latest Ref Range: 70 - 99 mg/dL 69 (L) 48 (L)  Results for Holly Baldwin, Holly Baldwin (MRN [id number]) as of 12/26/2020 11:34  Ref. Range 11/12/2020 13:50 12/09/2020 04:32  Hemoglobin A1C Latest Ref Range: 4.8 - 5.6 % 6.7 (A) 6.8 (H)  Results for Holly Baldwin, Holly Baldwin (MRN [id number]) as of 12/26/2020 11:34  Ref. Range 11/29/2020 17:14  Glucose Latest Ref Range: 70 - 99 mg/dL 76   Review of Glycemic Control  Diabetes history: DM2 Outpatient Diabetes medications: Lantus 40 units QHS, Humalog 10 units TID with meals, Glipizide 5 mg QAM Current orders for Inpatient glycemic control: Semglee 40 units QHS, Novolog 0-15 units TID with meals, Novolog 0-5 units QHS  Inpatient Diabetes Program Recommendations:    Insulin: NO Semglee given last night; current order to start tonight. Fasting glucose 140 mg/dl today and noon CBG 105 mg/dl. Please consider discontinuing Semglee at this time and can consider reordering at a much lower dose if needed.  NOTE: In reviewing chart, noted initial glucose 48 mg/dl on 12/25/20 and also glucose 69 mg/dl on 12/21/20 when patient had presented to hospital with similar symptoms on 12/21/20. Also noted ER visit on 11/29/20 for orthostatic syncope and patient's glucose at that time on labs was 76 mg/dl.  Question if symptoms are associated with hypoglycemia.  Patient sees [facility] and last seen Abbe Amsterdam, NP on 11/12/20. Spoke with patient over the phone regarding DM control. Patient states she consistently takes Lantus 40 units QHS, Humalog 10 units TID, and Glipizide 5 mg daily. Patient denies any issues with hypoglycemia that she is aware of. Patient reports that she uses FreeStyle Libre2  CGM but notes that she has not put it back on since she was at the hospital on 12/21/20 but has been doing finger stick CBGs. Patient states that she never receives any type of alarms from the [employer] app that she uses on her phone (no alarm for high or low). Asked patient if she has checked glucose when she gets symptoms of weakness, numbness, syncope and she states she does not usually check glucose during those times. Discussed hypoglycemia along with symptoms and treatment. Explained to patient that given initial glucose of 69 mg/dl on 12/21/20 and 48 mg/dl on 12/25/20, question if symptoms could be related to hypoglycemia. Asked patient to be sure to get a new FreeStyle Libre2 sensor on as soon as she gets home after discharge and asked that she check to be sure that alarms are turned on through her app so that it would alarm if CBG less than 70 mg/dl or over 240 mg/dl. Asked patient to contact [facility] if she has any issues at all with hypoglycemia as her DM medications may need to be adjusted.  Patient verbalized understanding of information and states she has no  questions at this time.  Thanks, Barnie Alderman, RN, MSN, CDE Diabetes Coordinator Inpatient Diabetes Program [phone] (Team Pager from 8am to 5pm)

## 2020-12-26 NOTE — Evaluation (Signed)
Occupational Therapy Evaluation Patient Details Name: Holly Baldwin MRN: [id number] DOB: 10-10-1945 Today's Date: 12/26/2020   History of Present Illness Holly Baldwin is a 75 y.o. female with medical history significant of type 2 diabetes mellitus, hypertension, hyperlipidemia, chronic diastolic heart failure, stage IV chronic kidney disease with baseline creatinine 1.9-2.3 presenting with right-sided weakness.  Patient reports having right-sided weakness since about 3 AM this morning.  Symptoms have been fairly intermittent.  Patient noted to have been recently admitted October 8 through October 10 for stroke work-up.  Patient also had right-sided numbness and weakness at that time.  Patient denies being previously admitted for similar symptoms.  Patient states she has intermittent right-sided weakness as well as numbness that affects face arm and the leg.  Was discharged on aspirin and Plavix.  Patient reports compliance with medication.  No chest pain or shortness of breath.  No fevers or chills.  No abdominal pain.  Patient states that her diabetes as well as high blood pressure is overall well controlled.   Clinical Impression   Pt agreeable to OT/PT co-evaluation. Pt demonstrated ability to complete lower body dressing without assist. Pt able to ambulate in room and hall with Mod I level of assist using straight cane. Pt demonstrates general B UE weakness with no significant difference in strength between R and L UE. Pt appears to be at or near baseline levels and is not recommended for further acute OT services. Pt will be discharged to care of nursing staff for remaining length of stay.      Recommendations for follow up therapy are one component of a multi-disciplinary discharge planning process, led by the attending physician.  Recommendations may be updated based on patient status, additional functional criteria and insurance authorization.   Follow Up Recommendations  No OT follow up     Assistance Recommended at Discharge PRN  Functional Status Assessment  Patient has not had a recent decline in their functional status  Equipment Recommendations  None recommended by OT    Recommendations for Other Services       Precautions / Restrictions Precautions Precautions: None Restrictions Weight Bearing Restrictions: No      Mobility Bed Mobility Overal bed mobility: Independent                  Transfers Overall transfer level: Modified independent Equipment used: Straight cane                      Balance Overall balance assessment: Mild deficits observed, not formally tested                                         ADL either performed or assessed with clinical judgement   ADL Overall ADL's : Modified independent                                     Functional mobility during ADLs: Modified independent;Cane General ADL Comments: Indepdnently able to doff and on sock seated at EOB.     Vision Baseline Vision/History: 1 Wears glasses Ability to See in Adequate Light: 0 Adequate Patient Visual Report: No change from baseline Vision Assessment?: No apparent visual deficits                Pertinent Vitals/Pain Pain  Assessment: No/denies pain     Hand Dominance Right   Extremity/Trunk Assessment Upper Extremity Assessment Upper Extremity Assessment: Generalized weakness (No significant strength differences noted between L or R UE.)   Lower Extremity Assessment Lower Extremity Assessment: Defer to PT evaluation   Cervical / Trunk Assessment Cervical / Trunk Assessment: Normal   Communication Communication Communication: No difficulties   Cognition Arousal/Alertness: Awake/alert Behavior During Therapy: WFL for tasks assessed/performed Overall Cognitive Status: Within Functional Limits for tasks assessed                                                        Home  Living Family/patient expects to be discharged to:: Private residence Living Arrangements: Spouse/significant other Available Help at Discharge: Family;Available PRN/intermittently (Husband leaves house for a medical procedure at times.) Type of Home: House Home Access: Level entry     Home Layout: One level     Bathroom Shower/Tub: Teacher, early years/pre: Standard Bathroom Accessibility: No   Home Equipment: Cane - single point;Shower seat          Prior Functioning/Environment Prior Level of Function : Needs assist             Mobility Comments: Pt uses cane fore household ambulation. ADLs Comments: Independent for ADL's; husband assists with IADL's                      OT Goals(Current goals can be found in the care plan section) Acute Rehab OT Goals Patient Stated Goal: return home  OT Frequency:                 Co-evaluation PT/OT/SLP Co-Evaluation/Treatment: Yes Reason for Co-Treatment: To address functional/ADL transfers   OT goals addressed during session: ADL's and self-care      AM-PAC OT "6 Clicks" Daily Activity     Outcome Measure Help from another person eating meals?: None Help from another person taking care of personal grooming?: None Help from another person toileting, which includes using toliet, bedpan, or urinal?: None Help from another person bathing (including washing, rinsing, drying)?: None Help from another person to put on and taking off regular upper body clothing?: None Help from another person to put on and taking off regular lower body clothing?: None 6 Click Score: 24   End of Session Equipment Utilized During Treatment:  (cane)  Activity Tolerance: Patient tolerated treatment well Patient left: Other (comment) (Placed in chair to be taken to procedure.)  OT Visit Diagnosis: Unsteadiness on feet (R26.81);Other abnormalities of gait and mobility (R26.89);Muscle weakness (generalized) (M62.81)                 Time: 1000-1014 OT Time Calculation (min): 14 min Charges:  OT General Charges $OT Visit: 1 Visit OT Evaluation $OT Eval Low Complexity: 1 Low  Randell Teare OT, MOT  Larey Seat 12/26/2020, 12:24 PM

## 2020-12-26 NOTE — Progress Notes (Signed)
PROGRESS NOTE    Holly Baldwin  VHQ:[id number] DOB: 08-12-1945 DOA: 12/25/2020 PCP: Sharilyn Sites, MD    No chief complaint on file.   Brief Narrative:  History of insulin-dependent type 2 diabetes,hypertension, hyperlipidemia, chronic diastolic heart failure, stage IV chronic kidney disease with baseline creatinine 1.9-2.3, presents to [facility] third time this months due to right side weakness, appear to have  hypoglycemia each time with right side weakness  Subjective:  Sitting up in chair, denies pain, reports intermittent right site leg numbness, impaired memory, not able remember the months, reports confusion is new for the last few days, reports has headache a few seconds yesterday, no headache today Reports continue to feel right side weakness She has been npo. Wanting to eat About to get EEG  Assessment & Plan:   Active Problems:   TIA (transient ischemic attack)   Right side weakness Recurrent TIA? He was transfer to [facility] during last hospitalization, Echocardiogram shows preserved EF.  No embolic etiology, neurology recommended statin , Aspirin and Plavix for 3 weeks followed by plavix only. Repeat mri here no acute findings this time EEG pending Neurology following here Continue aspirin, Plavix, statin  Insulin dependent DM2 with hyper and hypoglycemia  A1c 6.8 Appear to have hypoglycemia each time presenting to the ED with right side weakness,  Has hypoglycemia again this time in the ED, long acting insulin held on admission Blood glucose start to trend up, will start low dose long acting insulin, continue ssi Will discontinue glipizide due to hypoglycemia side effect Appreciate diabetes RN input  Hypokalemia, replace K, check mag  HTN: Stable on current regimen  Hyperlipidemia continue statin   CKDIV appear to be at baseline, renal dosing meds  obesity: Body mass index is 35.02 kg/m.Marland Kitchen  Neurology recommended outpatient sleep study during last  hospitalization , will verify with patient      Unresulted Labs (From admission, onward)     Start     Ordered   01/01/21 0500  Creatinine, serum  (enoxaparin (LOVENOX)    CrCl >/= 30 ml/min)  Weekly,   R     Comments: while on enoxaparin therapy    12/25/20 1857              DVT prophylaxis: enoxaparin (LOVENOX) injection 40 mg Start: 12/25/20 1900 SCD's Start: 12/25/20 1857   Code Status: Full Family Communication: Patient Disposition:   Status is: Observation   Dispo: The patient is from: Home              Anticipated d/c is to: Home with home health              Anticipated d/c date is: Possible tomorrow if cleared by neurology and blood glucose stable                Consultants:  Urology, Dr. Merlene Laughter  Procedures:  EEG  Antimicrobials:    Anti-infectives (From admission, onward)    None          Objective: Vitals:   12/25/20 2036 12/26/20 0002 12/26/20 0459 12/26/20 0700  BP: (!) 180/77 (!) 154/68 (!) 181/64 (!) 180/56  Pulse: 71 65 (!) 57 (!) 59  Resp: 19 18 18 14   Temp: 98.3 F (36.8 C) 98.3 F (36.8 C) 98.5 F (36.9 C) 97.9 F (36.6 C)  TempSrc:      SpO2: 98% 99% 97% 98%  Weight:      Height:        Intake/Output  Summary (Last 24 hours) at 12/26/2020 1146 Last data filed at 12/26/2020 0900 Gross per 24 hour  Intake 0 ml  Output 500 ml  Net -500 ml   Filed Weights   12/25/20 1638  Weight: 92.5 kg    Examination:  General exam: alert, awake, communicative,calm, NAD, slightly confused about the month, report poor memory Respiratory system: Clear to auscultation. Respiratory effort normal. Cardiovascular system:  RRR.  Gastrointestinal system: Abdomen is nondistended, soft and nontender.  Normal bowel sounds heard. Central nervous system: Alert and oriented. No focal neurological deficits. Extremities:  no edema Skin: No rashes, lesions or ulcers Psychiatry: Judgement and insight appear normal. Mood & affect appropriate.      Data Reviewed: I have personally reviewed following labs and imaging studies  CBC: Recent Labs  Lab 12/21/20 1715 12/25/20 1202  WBC 7.4 8.0  NEUTROABS 4.8  --   HGB 11.1* 11.6*  HCT 34.5* 36.6  MCV 82.9 84.7  PLT 267 315    Basic Metabolic Panel: Recent Labs  Lab 12/21/20 1715 12/25/20 1202 12/26/20 0533  NA 138 141 144  K 3.4* 3.4* 3.6  CL 102 106 109  CO2 28 25 25   GLUCOSE 69* 48* 119*  BUN 49* 55* 41*  CREATININE 2.22* 1.97* 1.74*  CALCIUM 8.6* 8.9 8.6*    GFR: Estimated Creatinine Clearance: 30.3 mL/min (A) (by C-G formula based on SCr of 1.74 mg/dL (H)).  Liver Function Tests: Recent Labs  Lab 12/26/20 0533  AST 12*  ALT 13  ALKPHOS 100  BILITOT 0.3  PROT 6.5  ALBUMIN 3.4*    CBG: Recent Labs  Lab 12/25/20 1406 12/25/20 1601 12/25/20 1857 12/25/20 2057 12/26/20 0736  GLUCAP 97 107* 100* 173* 140*     Recent Results (from the past 240 hour(s))  Resp Panel by RT-PCR (Flu A&B, Covid) Nasopharyngeal Swab     Status: None   Collection Time: 12/25/20  7:30 PM   Specimen: Nasopharyngeal Swab; Nasopharyngeal(NP) swabs in vial transport medium  Result Value Ref Range Status   SARS Coronavirus 2 by RT PCR NEGATIVE NEGATIVE Final    Comment: (NOTE) SARS-CoV-2 target nucleic acids are NOT DETECTED.  The SARS-CoV-2 RNA is generally detectable in upper respiratory specimens during the acute phase of infection. The lowest concentration of SARS-CoV-2 viral copies this assay can detect is 138 copies/mL. A negative result does not preclude SARS-Cov-2 infection and should not be used as the sole basis for treatment or other patient management decisions. A negative result may occur with  improper specimen collection/handling, submission of specimen other than nasopharyngeal swab, presence of viral mutation(s) within the areas targeted by this assay, and inadequate number of viral copies(<138 copies/mL). A negative result must be combined  with clinical observations, patient history, and epidemiological information. The expected result is Negative.  Fact Sheet for Patients:  EntrepreneurPulse.com.au  Fact Sheet for Healthcare Providers:  IncredibleEmployment.be  This test is no t yet approved or cleared by the [place] FDA and  has been authorized for detection and/or diagnosis of SARS-CoV-2 by FDA under an Emergency Use Authorization (EUA). This EUA will remain  in effect (meaning this test can be used) for the duration of the COVID-19 declaration under Section 564(b)(1) of the Act, 21 U.S.C.section 360bbb-3(b)(1), unless the authorization is terminated  or revoked sooner.       Influenza A by PCR NEGATIVE NEGATIVE Final   Influenza B by PCR NEGATIVE NEGATIVE Final    Comment: (NOTE) The Xpert Xpress SARS-CoV-2/FLU/RSV  plus assay is intended as an aid in the diagnosis of influenza from Nasopharyngeal swab specimens and should not be used as a sole basis for treatment. Nasal washings and aspirates are unacceptable for Xpert Xpress SARS-CoV-2/FLU/RSV testing.  Fact Sheet for Patients: EntrepreneurPulse.com.au  Fact Sheet for Healthcare Providers: IncredibleEmployment.be  This test is not yet approved or cleared by the [place] FDA and has been authorized for detection and/or diagnosis of SARS-CoV-2 by FDA under an Emergency Use Authorization (EUA). This EUA will remain in effect (meaning this test can be used) for the duration of the COVID-19 declaration under Section 564(b)(1) of the Act, 21 U.S.C. section 360bbb-3(b)(1), unless the authorization is terminated or revoked.  Performed at [facility], [address], [place], [place] [address]          Radiology Studies: CT Head Wo Contrast  Result Date: 12/25/2020 CLINICAL DATA:  Fatigue and numbness in all extremities. EXAM: CT HEAD WITHOUT CONTRAST TECHNIQUE:  Contiguous axial images were obtained from the base of the skull through the vertex without intravenous contrast. COMPARISON:  Brain MRI 12/21/2020.  Head CT 12/08/2020 FINDINGS: Brain: There is no evidence for acute hemorrhage, hydrocephalus, mass lesion, or abnormal extra-axial fluid collection. No definite CT evidence for acute infarction. Diffuse loss of parenchymal volume is consistent with atrophy. Patchy low attenuation in the deep hemispheric and periventricular white matter is nonspecific, but likely reflects chronic microvascular ischemic demyelination. Lacunar infarcts noted left basal ganglia. Vascular: No hyperdense vessel or unexpected calcification. Skull: No evidence for fracture. No worrisome lytic or sclerotic lesion. Sinuses/Orbits: The visualized paranasal sinuses and mastoid air cells are clear. Visualized portions of the globes and intraorbital fat are unremarkable. Other: None. IMPRESSION: Stable.  No acute intracranial abnormality. Atrophy with chronic small vessel white matter ischemic disease. Electronically Signed   By: Misty Stanley M.D.   On: 12/25/2020 17:02        Scheduled Meds:  aspirin EC  81 mg Oral Daily   clopidogrel  75 mg Oral Daily   enoxaparin (LOVENOX) injection  40 mg Subcutaneous Q24H   hydrALAZINE  100 mg Oral TID with meals   insulin aspart  0-15 Units Subcutaneous TID WC   insulin aspart  0-5 Units Subcutaneous QHS   insulin glargine-yfgn  40 Units Subcutaneous QHS   NIFEdipine  60 mg Oral Daily   simvastatin  40 mg Oral Daily   Continuous Infusions:   LOS: 0 days   Time spent: 28mins Greater than 50% of this time was spent in counseling, explanation of diagnosis, planning of further management, and coordination of care.   Voice Recognition Viviann Spare dictation system was used to create this note, attempts have been made to correct errors. Please contact the author with questions and/or clarifications.   Florencia Reasons, MD PhD FACP [facility]  Available via Epic secure chat 7am-7pm for nonurgent issues Please page for urgent issues To page the attending provider between 7A-7P or the covering provider during after hours 7P-7A, please log into the web site [email or web address] and access using universal [place] password for that web site. If you do not have the password, please call the hospital operator.    12/26/2020, 11:46 AM

## 2020-12-26 NOTE — Progress Notes (Signed)
SLP Cancellation Note  Patient Details Name: Holly Baldwin MRN: [id number] DOB: 03/05/1945   Cancelled treatment:       Reason Eval/Treat Not Completed: SLP screened, no needs identified, will sign off; SLP screened Pt in room. Pt denies any changes in swallowing, speech, language, or cognition. MRI negative for acute changes. Pt stated that her speech is only impaired transiently when she has "an episode" and is now back to baseline. SLE will be deferred at this time. Reconsult if indicated. SLP will sign off.   Thank you,  Genene Churn, [place]  [place] 12/26/2020, 6:06 PM

## 2020-12-26 NOTE — Progress Notes (Signed)
EEG complete - results pending 

## 2020-12-26 NOTE — Evaluation (Addendum)
Physical Therapy Evaluation Patient Details Name: Holly Baldwin MRN: [id number] DOB: 22-Aug-1945 Today's Date: 12/26/2020  History of Present Illness  Holly Baldwin is a 75 y.o. female with medical history significant of type 2 diabetes mellitus, hypertension, hyperlipidemia, chronic diastolic heart failure, stage IV chronic kidney disease with baseline creatinine 1.9-2.3 presenting with right-sided weakness.  Patient reports having right-sided weakness since about 3 AM this morning.  Symptoms have been fairly intermittent.  Patient noted to have been recently admitted October 8 through October 10 for stroke work-up.  Patient also had right-sided numbness and weakness at that time.  Patient denies being previously admitted for similar symptoms.  Patient states she has intermittent right-sided weakness as well as numbness that affects face arm and the leg.  Was discharged on aspirin and Plavix.  Patient reports compliance with medication.  No chest pain or shortness of breath.  No fevers or chills.  No abdominal pain.  Patient states that her diabetes as well as high blood pressure is overall well controlled.   Clinical Impression  Patient in bed awake, alert, and agreeable for therapy. Patient demonstrates independence w/ bed mobility and modified independence w/ transfers. Patient demonstrates steadiness w/ ambulation using a straight cane w/out fatigue, no loss of balance. Patient did trip during ambulation in the hallway, was able to recover quickly without assistance. Patient discharged to care of nursing for ambulation daily as tolerated for length of stay.       Recommendations for follow up therapy are one component of a multi-disciplinary discharge planning process, led by the attending physician.  Recommendations may be updated based on patient status, additional functional criteria and insurance authorization.  Follow Up Recommendations No PT follow up    Assistance Recommended at Discharge  None  Functional Status Assessment    Equipment Recommendations  None recommended by PT    Recommendations for Other Services       Precautions / Restrictions Precautions Precautions: None Restrictions Weight Bearing Restrictions: No      Mobility  Bed Mobility Overal bed mobility: Independent             General bed mobility comments: HOB flat    Transfers Overall transfer level: Modified independent Equipment used: Straight cane               General transfer comment: at baseline    Ambulation/Gait   Gait Distance (Feet): 100 Feet Assistive device: Straight cane Gait Pattern/deviations: WFL(Within Functional Limits) Gait velocity: Functional   General Gait Details: Patitent able to ambulate using cane w/out fatigue, no loss of balance. Patient tripped during ambulation but was able to quickly recover without assistance.  Stairs            Wheelchair Mobility    Modified Rankin (Stroke Patients Only)       Balance Overall balance assessment: Modified Independent                                           Pertinent Vitals/Pain Pain Assessment: No/denies pain    Home Living Family/patient expects to be discharged to:: Private residence Living Arrangements: Spouse/significant other Available Help at Discharge: Family;Available PRN/intermittently;Other (Comment) (Husband leaves house for medical procedure at times.) Type of Home: House Home Access: Level entry       Home Layout: One [place] - single point;Shower seat  Prior Function Prior Level of Function : Needs assist       Physical Assist : Mobility (physical)     Mobility Comments: Patient uses a SPC for household ambulation, does not drive ADLs Comments: Independent for ADL's; husband assists with IADL's     Hand Dominance   Dominant Hand: Right    Extremity/Trunk Assessment   Upper Extremity Assessment Upper Extremity  Assessment: Defer to OT evaluation    Lower Extremity Assessment Lower Extremity Assessment: Overall WFL for tasks assessed    Cervical / Trunk Assessment Cervical / Trunk Assessment: Normal  Communication   Communication: No difficulties  Cognition Arousal/Alertness: Awake/alert Behavior During Therapy: WFL for tasks assessed/performed Overall Cognitive Status: Within Functional Limits for tasks assessed                                          General Comments      Exercises     Assessment/Plan    PT Assessment Patient does not need any further PT services  PT Problem List         PT Treatment Interventions      PT Goals (Current goals can be found in the Care Plan section)  Acute Rehab PT Goals Patient Stated Goal: Return home PT Goal Formulation: With patient Time For Goal Achievement: 12/26/20 Potential to Achieve Goals: Fair    Frequency     Barriers to discharge        Co-evaluation PT/OT/SLP Co-Evaluation/Treatment: Yes Reason for Co-Treatment: To address functional/ADL transfers PT goals addressed during session: Mobility/safety with mobility OT goals addressed during session: ADL's and self-care       AM-PAC PT "6 Clicks" Mobility  Outcome Measure Help needed turning from your back to your side while in a flat bed without using bedrails?: None Help needed moving from lying on your back to sitting on the side of a flat bed without using bedrails?: None Help needed moving to and from a bed to a chair (including a wheelchair)?: None Help needed standing up from a chair using your arms (e.g., wheelchair or bedside chair)?: None Help needed to walk in hospital room?: None Help needed climbing 3-5 steps with a railing? : None 6 Click Score: 24    End of Session   Activity Tolerance: Patient tolerated treatment well Patient left: in chair;with nursing/sitter in room;Other (comment) (Patient was left in a transport chair with  nursing, about to go for imaging.) Nurse Communication: Mobility status PT Visit Diagnosis: Unsteadiness on feet (R26.81);Other abnormalities of gait and mobility (R26.89);Muscle weakness (generalized) (M62.81)    Time: 7262-0355 PT Time Calculation (min) (ACUTE ONLY): 19 min   Charges:   PT Evaluation $PT Eval Low Complexity: 1 Low PT Treatments $Therapeutic Activity: 8-22 mins        Cassie Jones, SPT  During this treatment session, the therapist was present, participating in and directing the treatment.  3:13 PM, 12/26/20 Lonell Grandchild, MPT Physical Therapist with [facility] [phone] office [phone] mobile phone

## 2020-12-26 NOTE — Consult Note (Signed)
[name], MD     [email or web address]          Holly Baldwin is an 75 y.o. female.   ASSESSMENT/PLAN: UNEXPLAINED RIGHT-SIDED WEAKNESS: The patient workup has been unrevealing for acute stroke to explain her symptoms. EEG has also been negative. Cervical spine MRI has not been done in this is recommended. I was informed by the hospitalist that the nutritionist as discovered the spell seems to occur in the settings of hypoglycemia in the 40s which may be the culprit. Remote lacunar infarcts in: Continue with current regimen and follow-up in the office in about 6 weeks. Also continue with blood pressure control which is vital. Extensive chronic ischemic white matter changes which increases the long-term risk of cognitive impairment and gait impairment. Again, blood pressure control is vital.      This is a 75 year old who was seen earlier this month with the weakness on the right side. The patient reports that her symptoms improve although she thinks she was never back to baseline. She has had episodes of these events since he has started couple weeks ago. She reports having some Right facial discomfort with these episodes at times.  She does not report convulsions, loss of consciousness, chest pain, dysarthria or dysphagia. She may have headaches with these episodes. After the initial event, she was placed on dual antiplatelet agents and is compliant with this. The review systems otherwise negative.      HPI: Holly Baldwin is a 75 y.o. female with medical history significant of type 2 diabetes mellitus, hypertension, hyperlipidemia, chronic diastolic heart failure, stage IV chronic kidney disease with baseline creatinine 1.9-2.3 presenting with right-sided weakness.  Patient reports having right-sided weakness since about 3 AM this morning.  Symptoms have been fairly intermittent.  Patient noted to have been recently admitted October 8 through October 10 for stroke  work-up.  Patient also had right-sided numbness and weakness at that time.  Patient denies being previously admitted for similar symptoms.  Patient states she has intermittent right-sided weakness as well as numbness that affects face arm and the leg.  Was discharged on aspirin and Plavix.  Patient reports compliance with medication.  No chest pain or shortness of breath.  No fevers or chills.  No abdominal pain.  Patient states that her diabetes as well as high blood pressure is overall well controlled.    STROKE   12-10-20 STROKE MD NOTE : She presented with transient right sided numbness and weakness likely left hemispheric TIA from small vessel disease.  Neurological exam is nonfocal.  MRI scan of the brain was negative for any acute abnormality and MRI brain only shows mild left P2 stenosis.  2D echo showed normal ejection fraction.  LDL cholesterol 79 mg percent hemoglobin A1c 6.8.  Patient appears to be at risk for sleep apnea but is not willing to stay for further evaluation.  Recommend aspirin and Plavix for 3 weeks followed by Plavix alone.  Increase dose of statin.  Follow-up as an outpatient stroke clinic in 2 months.  Discussed with Dr. Posey Pronto.  Greater than 50% time during this 35-minute visit was spent in counseling and coordination of care and discussion with care team.  Stroke team will sign off.  Kindly call for questions     Blood pressure (!) 168/65, pulse 62, temperature 98.2 F (36.8 C), resp. rate 17, height '5\' 4"'  (1.626 m), weight 92.5 kg, SpO2 100 %.   GENERAL:  This is a pleasant  female who is doing well at this.  HEENT:  Neck is supple no trauma noted.  ABDOMEN: soft  EXTREMITIES: No edema   BACK: Normal  SKIN: Normal by inspection.    MENTAL STATUS: Alert and oriented. Speech, language and cognition are generally intact. Judgment and insight normal.   CRANIAL NERVES: Pupils are equal, round and reactive to light and accomodation; extra ocular movements are full,  there is no significant nystagmus; visual fields are full; upper and lower facial muscles are normal in strength and symmetric, there is no flattening of the nasolabial folds; tongue is midline; uvula is midline; shoulder elevation is normal.  MOTOR:  There is mild a drift of the right upper extremity and mild drift of the right lower extremity.  Strength of right upper extremity proximally is normal and slightly weak in the hands 4+. Right leg is normal.  Bulk and tone are normal. The left side shows normal tone, bulk concerning.  COORDINATION: Left finger to nose is normal, right finger to nose is normal, No rest tremor; no intention tremor; no postural tremor; no bradykinesia.  REFLEXES: Deep tendon reflexes are symmetrical and normal. Plantar reflexes are extensor on the right and flexor on the left.   SENSATION: Normal to light touch, temperature, and pain.     Past Medical History:  Diagnosis Date   Anxiety    Chronic abdominal pain    Diabetes mellitus    GERD (gastroesophageal reflux disease)    Hiatal hernia    Hypercholesteremia    Hypertension     Past Surgical History:  Procedure Laterality Date   ABDOMINAL HYSTERECTOMY     CATARACT EXTRACTION W/PHACO Right 01/09/2020   Procedure: CATARACT EXTRACTION PHACO AND INTRAOCULAR LENS PLACEMENT ([place]);  Surgeon: Baruch Goldmann, MD;  Location: [facility];  Service: Ophthalmology;  Laterality: Right;  CDE: 12.01   CATARACT EXTRACTION W/PHACO Left 01/23/2020   Procedure: CATARACT EXTRACTION PHACO AND INTRAOCULAR LENS PLACEMENT LEFT EYE;  Surgeon: Baruch Goldmann, MD;  Location: [facility];  Service: Ophthalmology;  Laterality: Left;  CDE 8.71   COLONOSCOPY N/A 06/23/2012   Procedure: COLONOSCOPY;  Surgeon: Rogene Houston, MD;  Location: [facility];  Service: Endoscopy;  Laterality: N/A;  100-moved to 1200 Ann to notify pt   ESOPHAGOGASTRODUODENOSCOPY N/A 05/06/2012   Procedure: ESOPHAGOGASTRODUODENOSCOPY (EGD);  Surgeon: Rogene Houston, MD;   Location: [facility];  Service: Endoscopy;  Laterality: N/A;   MASS EXCISION Right 05/26/2012   Procedure: EXCISION NEOPLASM RIGHT THIGH;  Surgeon: Jamesetta So, MD;  Location: [facility];  Service: General;  Laterality: Right;    Family History  Problem Relation Age of Onset   Diabetes Mother    Diabetes Father    Stroke Father    Diabetes Sister    Diabetes Brother    Colon cancer Neg Hx    Colon polyps Neg Hx     Social History:  reports that she has never smoked. She has never used smokeless tobacco. She reports that she does not drink alcohol and does not use drugs.  Allergies:  Allergies  Allergen Reactions   Bee Venom Anaphylaxis   Penicillins Hives    .Has patient had a PCN reaction causing immediate rash, facial/tongue/throat swelling, SOB or lightheadedness with hypotension: Yes Has patient had a PCN reaction causing severe rash involving mucus membranes or skin necrosis: No Has patient had a PCN reaction that required hospitalization: Yes Has patient had a PCN reaction occurring within the last 10 years:  No If all of the above answers are "NO", then may proceed with Cephalosporin use.     Medications: Prior to Admission medications   Medication Sig Start Date End Date Taking? Authorizing Provider  albuterol (PROVENTIL HFA;VENTOLIN HFA) 108 (90 Base) MCG/ACT inhaler Inhale 1-2 puffs into the lungs every 6 (six) hours as needed for wheezing or shortness of breath.    Yes [provider]  aspirin EC 81 MG tablet Take 1 tablet (81 mg total) by mouth daily for 21 days. Swallow whole. 12/10/20 12/31/20 Yes Lavina Hamman, MD  Blood Glucose Monitoring Suppl (ACCU-CHEK AVIVA PLUS) w/Device KIT  03/09/20   [provider]  calcitRIOL (ROCALTROL) 0.25 MCG capsule Take 0.25 mcg by mouth every Monday, Wednesday, and Friday. 11/28/20   [provider]  clopidogrel (PLAVIX) 75 MG tablet Take 1 tablet (75 mg total) by mouth daily. 12/10/20 12/10/21  Lavina Hamman, MD  furosemide (LASIX) 40 MG tablet Take 40 mg by mouth 2 (two) times daily. 08/30/20   [provider]  gabapentin (NEURONTIN) 100 MG capsule TAKE 1 CAPSULE TWICE DAILY Patient not taking: No sig reported 12/31/19   Cassandria Anger, MD  glipiZIDE (GLUCOTROL) 5 MG tablet Take 5 mg by mouth daily before breakfast.    [provider]  glucose blood (ACCU-CHEK AVIVA PLUS) test strip TEST BLOOD GLUCOSE FOUR TIMES DAILY 05/02/20   Cassandria Anger, MD  hydrALAZINE (APRESOLINE) 100 MG tablet Take 1 tablet (100 mg total) by mouth 3 (three) times daily. Patient taking differently: Take 100 mg by mouth with breakfast, with lunch, and with evening meal. 08/25/18   Barton Dubois, MD  insulin lispro (HUMALOG) 100 UNIT/ML KwikPen Inject 10 Units into the skin 3 (three) times daily. With meals 04/04/20   Brita Romp, NP  isosorbide mononitrate (IMDUR) 60 MG 24 hr tablet Take 1 tablet (60 mg total) by mouth daily. 08/26/18   Barton Dubois, MD  LANTUS SOLOSTAR 100 UNIT/ML Solostar Pen INJECT 40 UNITS SUBCUTANEOUSLY AT 10PM. Patient taking differently: Inject 40 Units into the skin at bedtime. 09/10/20   Cassandria Anger, MD  levothyroxine (SYNTHROID) 137 MCG tablet TAKE 1 TABLET (137 MCG TOTAL) BY MOUTH DAILY BEFORE BREAKFAST. 12/20/19   Cassandria Anger, MD  lisinopril (ZESTRIL) 20 MG tablet Take 20 mg by mouth daily. 08/30/20   [provider]  lisinopril (ZESTRIL) 30 MG tablet Take 30 mg by mouth daily. 10/12/20   [provider]  NIFEdipine (PROCARDIA XL/NIFEDICAL XL) 60 MG 24 hr tablet Take 60 mg by mouth daily. 12/19/19   [provider]  NIFEdipine (PROCARDIA XL/NIFEDICAL-XL) 90 MG 24 hr tablet Take 90 mg by mouth daily. 12/11/20   [provider]  RESTASIS 0.05 % ophthalmic emulsion 1 drop 2 (two) times daily. 12/17/20   [provider]  simvastatin (ZOCOR) 40 MG tablet TAKE 1 TABLET EVERY DAY Patient taking  differently: Take 40 mg by mouth daily. 10/05/17   Fayrene Helper, MD  ziprasidone (GEODON) 40 MG capsule Take 40 mg by mouth at bedtime. 10/27/20   [provider]    Scheduled Meds:  aspirin EC  81 mg Oral Daily   clopidogrel  75 mg Oral Daily   enoxaparin (LOVENOX) injection  40 mg Subcutaneous Q24H   hydrALAZINE  100 mg Oral TID with meals   insulin aspart  0-15 Units Subcutaneous TID WC   insulin aspart  0-5 Units Subcutaneous QHS   insulin glargine-yfgn  10  Units Subcutaneous QHS   NIFEdipine  60 mg Oral Daily   simvastatin  40 mg Oral Daily   Continuous Infusions: PRN Meds:.acetaminophen **OR** acetaminophen (TYLENOL) oral liquid 160 mg/5 mL **OR** acetaminophen, senna-docusate     Results for orders placed or performed during the hospital encounter of 12/25/20 (from the past 48 hour(s))  Basic metabolic panel     Status: Abnormal   Collection Time: 12/25/20 12:02 PM  Result Value Ref Range   Sodium 141 135 - 145 mmol/L   Potassium 3.4 (L) 3.5 - 5.1 mmol/L   Chloride 106 98 - 111 mmol/L   CO2 25 22 - 32 mmol/L   Glucose, Bld 48 (L) 70 - 99 mg/dL    Comment: Glucose reference range applies only to samples taken after fasting for at least 8 hours.   BUN 55 (H) 8 - 23 mg/dL   Creatinine, Ser 1.97 (H) 0.44 - 1.00 mg/dL   Calcium 8.9 8.9 - 10.3 mg/dL   GFR, Estimated 26 (L) >60 mL/min    Comment: (NOTE) Calculated using the CKD-EPI Creatinine Equation (2021)    Anion gap 10 5 - 15    Comment: Performed at [facility], [address] [place], [place] [address]  CBC     Status: Abnormal   Collection Time: 12/25/20 12:02 PM  Result Value Ref Range   WBC 8.0 4.0 - 10.5 K/uL   RBC 4.32 3.87 - 5.11 MIL/uL   Hemoglobin 11.6 (L) 12.0 - 15.0 g/dL   HCT 36.6 36.0 - 46.0 %   MCV 84.7 80.0 - 100.0 fL   MCH 26.9 26.0 - 34.0 pg   MCHC 31.7 30.0 - 36.0 g/dL   RDW 15.8 (H) 11.5 - 15.5 %   Platelets 220 150 - 400 K/uL   nRBC 0.0 0.0 - 0.2 %    Comment: Performed at  [facility], [address] [place], [place] [address]  CBG monitoring, ED     Status: None   Collection Time: 12/25/20  2:06 PM  Result Value Ref Range   Glucose-Capillary 97 70 - 99 mg/dL    Comment: Glucose reference range applies only to samples taken after fasting for at least 8 hours.  Urinalysis, Routine w reflex microscopic Urine, Clean Catch     Status: Abnormal   Collection Time: 12/25/20  2:20 PM  Result Value Ref Range   Color, Urine STRAW (A) YELLOW   APPearance CLEAR CLEAR   Specific Gravity, Urine 1.006 1.005 - 1.030   pH 5.0 5.0 - 8.0   Glucose, UA NEGATIVE NEGATIVE mg/dL   Hgb urine dipstick SMALL (A) NEGATIVE   Bilirubin Urine NEGATIVE NEGATIVE   Ketones, ur NEGATIVE NEGATIVE mg/dL   Protein, ur 100 (A) NEGATIVE mg/dL   Nitrite NEGATIVE NEGATIVE   Leukocytes,Ua NEGATIVE NEGATIVE   RBC / HPF 0-5 0 - 5 RBC/hpf   WBC, UA 0-5 0 - 5 WBC/hpf   Bacteria, UA RARE (A) NONE SEEN   Squamous Epithelial / LPF 0-5 0 - 5    Comment: Performed at [facility], [address] [place], [place] [address]  CBG monitoring, ED     Status: Abnormal   Collection Time: 12/25/20  4:01 PM  Result Value Ref Range   Glucose-Capillary 107 (H) 70 - 99 mg/dL    Comment: Glucose reference range applies only to samples taken after fasting for at least 8 hours.  CBG monitoring, ED     Status: Abnormal   Collection Time: 12/25/20  6:57 PM  Result Value Ref Range   Glucose-Capillary 100 (H) 70 - 99 mg/dL    Comment: Glucose reference range applies only to samples taken after fasting for at least 8 hours.  Resp Panel by RT-PCR (Flu A&B, Covid) Nasopharyngeal Swab     Status: None   Collection Time: 12/25/20  7:30 PM   Specimen: Nasopharyngeal Swab; Nasopharyngeal(NP) swabs in vial transport medium  Result Value Ref Range   SARS Coronavirus 2 by RT PCR NEGATIVE NEGATIVE    Comment: (NOTE) SARS-CoV-2 target nucleic acids are NOT DETECTED.  The SARS-CoV-2 RNA is generally detectable in upper  respiratory specimens during the acute phase of infection. The lowest concentration of SARS-CoV-2 viral copies this assay can detect is 138 copies/mL. A negative result does not preclude SARS-Cov-2 infection and should not be used as the sole basis for treatment or other patient management decisions. A negative result may occur with  improper specimen collection/handling, submission of specimen other than nasopharyngeal swab, presence of viral mutation(s) within the areas targeted by this assay, and inadequate number of viral copies(<138 copies/mL). A negative result must be combined with clinical observations, patient history, and epidemiological information. The expected result is Negative.  Fact Sheet for Patients:  EntrepreneurPulse.com.au  Fact Sheet for Healthcare Providers:  IncredibleEmployment.be  This test is no t yet approved or cleared by the [place] FDA and  has been authorized for detection and/or diagnosis of SARS-CoV-2 by FDA under an Emergency Use Authorization (EUA). This EUA will remain  in effect (meaning this test can be used) for the duration of the COVID-19 declaration under Section 564(b)(1) of the Act, 21 U.S.C.section 360bbb-3(b)(1), unless the authorization is terminated  or revoked sooner.       Influenza A by PCR NEGATIVE NEGATIVE   Influenza B by PCR NEGATIVE NEGATIVE    Comment: (NOTE) The Xpert Xpress SARS-CoV-2/FLU/RSV plus assay is intended as an aid in the diagnosis of influenza from Nasopharyngeal swab specimens and should not be used as a sole basis for treatment. Nasal washings and aspirates are unacceptable for Xpert Xpress SARS-CoV-2/FLU/RSV testing.  Fact Sheet for Patients: EntrepreneurPulse.com.au  Fact Sheet for Healthcare Providers: IncredibleEmployment.be  This test is not yet approved or cleared by the [place] FDA and has been authorized for  detection and/or diagnosis of SARS-CoV-2 by FDA under an Emergency Use Authorization (EUA). This EUA will remain in effect (meaning this test can be used) for the duration of the COVID-19 declaration under Section 564(b)(1) of the Act, 21 U.S.C. section 360bbb-3(b)(1), unless the authorization is terminated or revoked.  Performed at [facility], [address] [place], [place] [address]   Glucose, capillary     Status: Abnormal   Collection Time: 12/25/20  8:57 PM  Result Value Ref Range   Glucose-Capillary 173 (H) 70 - 99 mg/dL    Comment: Glucose reference range applies only to samples taken after fasting for at least 8 hours.   Comment 1 Notify RN    Comment 2 Document in Chart   Comprehensive metabolic panel     Status: Abnormal   Collection Time: 12/26/20  5:33 AM  Result Value Ref Range   Sodium 144 135 - 145 mmol/L   Potassium 3.6 3.5 - 5.1 mmol/L   Chloride 109 98 - 111 mmol/L   CO2 25 22 - 32 mmol/L   Glucose, Bld 119 (H) 70 - 99 mg/dL    Comment: Glucose reference range applies only to samples taken after fasting  for at least 8 hours.   BUN 41 (H) 8 - 23 mg/dL   Creatinine, Ser 1.74 (H) 0.44 - 1.00 mg/dL   Calcium 8.6 (L) 8.9 - 10.3 mg/dL   Total Protein 6.5 6.5 - 8.1 g/dL   Albumin 3.4 (L) 3.5 - 5.0 g/dL   AST 12 (L) 15 - 41 U/L   ALT 13 0 - 44 U/L   Alkaline Phosphatase 100 38 - 126 U/L   Total Bilirubin 0.3 0.3 - 1.2 mg/dL   GFR, Estimated 30 (L) >60 mL/min    Comment: (NOTE) Calculated using the CKD-EPI Creatinine Equation (2021)    Anion gap 10 5 - 15    Comment: Performed at [facility], [address], [place], [place] [address]  Glucose, capillary     Status: Abnormal   Collection Time: 12/26/20  7:36 AM  Result Value Ref Range   Glucose-Capillary 140 (H) 70 - 99 mg/dL    Comment: Glucose reference range applies only to samples taken after fasting for at least 8 hours.  Glucose, capillary     Status: Abnormal   Collection Time: 12/26/20 12:05 PM   Result Value Ref Range   Glucose-Capillary 105 (H) 70 - 99 mg/dL    Comment: Glucose reference range applies only to samples taken after fasting for at least 8 hours.  Glucose, capillary     Status: Abnormal   Collection Time: 12/26/20  4:27 PM  Result Value Ref Range   Glucose-Capillary 183 (H) 70 - 99 mg/dL    Comment: Glucose reference range applies only to samples taken after fasting for at least 8 hours.    Studies/Results:  EEG IMPRESSION: This study is within normal limits. No seizures or epileptiform discharges were seen throughout the recording.     EXAM: MRI HEAD WITHOUT CONTRAST   MRA HEAD WITHOUT CONTRAST   MRA NECK WITHOUT AND WITH CONTRAST   TECHNIQUE: Multiplanar, multi-echo pulse sequences of the brain and surrounding structures were acquired without intravenous contrast. Angiographic images of the Circle of Willis were acquired using MRA technique without intravenous contrast. Angiographic images of the neck were acquired using MRA technique without and with intravenous contrast. Carotid stenosis measurements (when applicable) are obtained utilizing NASCET criteria, using the distal internal carotid diameter as the denominator.   CONTRAST:  75m GADAVIST GADOBUTROL 1 MMOL/ML IV SOLN   COMPARISON:  MRI head 12/21/2020, MRA head 12/09/2020   FINDINGS: MRI HEAD   Brain: There is no acute infarction or intracranial hemorrhage. There is no intracranial mass, mass effect, or edema. There is no hydrocephalus or extra-axial fluid collection.   Chronic small vessel infarcts of the left basal ganglia and adjacent white matter and right cerebellum. Patchy and confluent T2 hyperintensity in the supratentorial white matter is nonspecific but probably reflects stable moderate chronic microvascular ischemic changes.   Vascular: Major vessel flow voids at the skull base are preserved.   Skull and upper cervical spine: Normal marrow signal is preserved.    Sinuses/Orbits: Paranasal sinuses are aerated. Orbits are unremarkable.   Other: Sella is unremarkable.  Mastoid air cells are clear.   MRA HEAD   Intracranial internal carotid arteries are patent. Middle and anterior cerebral arteries are patent. Intracranial vertebral arteries, basilar artery, posterior cerebral arteries are patent. Focal moderate stenosis left P2 PCA. There is no significant stenosis or aneurysm.   MRA NECK   Great vessel origins are patent. Common, internal, and external carotid arteries are patent. Extracranial vertebral arteries are patent.  There is no hemodynamically significant stenosis.   IMPRESSION: No acute infarction, hemorrhage, or mass. Stable chronic findings detailed above.   No new large vessel occlusion. No hemodynamically significant stenosis in the neck. Unchanged moderate stenosis left P2 PCA.      The brain MRI is reviewed in person and shows no acute changes on DWI. Remote lacunar infarcts are noted in the left basal ganglia. No hemorrhages noted. 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   Brandan Glauber A. Merlene Laughter, M.D.  Diplomate, Tax adviser of Psychiatry and Neurology ( Neurology). 12/26/2020, 6:29 PM

## 2020-12-26 NOTE — Procedures (Signed)
Patient Name: Holly Baldwin  MRN: [id number]  Epilepsy Attending: Lora Havens  Referring Physician/Provider: Rodney Booze, PA-C Date: 12/26/2020 Duration: 27.02 mins  Patient history: 75 y/o F presenting for eval of right sided numbness that occurred for about 20 minutes.  EEG to evaluate for seizure.  Level of alertness: Awake, asleep  AEDs during EEG study: None  Technical aspects: This EEG study was done with scalp electrodes positioned according to the 10-20 International system of electrode placement. Electrical activity was acquired at a sampling rate of 500Hz  and reviewed with a high frequency filter of 70Hz  and a low frequency filter of 1Hz . EEG data were recorded continuously and digitally stored.   Description: The posterior dominant rhythm consists of 8 Hz activity of moderate voltage (25-35 uV) seen predominantly in posterior head regions, symmetric and reactive to eye opening and eye closing. Sleep was characterized by vertex waves, sleep spindles (12 to 14 Hz), maximal frontocentral region. Hyperventilation and photic stimulation were not performed.     IMPRESSION: This study is within normal limits. No seizures or epileptiform discharges were seen throughout the recording.  Marlow Hendrie Barbra Sarks

## 2020-12-27 ENCOUNTER — Observation Stay (HOSPITAL_COMMUNITY): Payer: Medicare Other

## 2020-12-27 DIAGNOSIS — E119 Type 2 diabetes mellitus without complications: Secondary | ICD-10-CM | POA: Diagnosis not present

## 2020-12-27 DIAGNOSIS — R413 Other amnesia: Secondary | ICD-10-CM | POA: Diagnosis not present

## 2020-12-27 DIAGNOSIS — E162 Hypoglycemia, unspecified: Secondary | ICD-10-CM | POA: Diagnosis not present

## 2020-12-27 DIAGNOSIS — Z794 Long term (current) use of insulin: Secondary | ICD-10-CM | POA: Diagnosis not present

## 2020-12-27 DIAGNOSIS — M542 Cervicalgia: Secondary | ICD-10-CM | POA: Diagnosis not present

## 2020-12-27 LAB — BASIC METABOLIC PANEL
Anion gap: 8 (ref 5–15)
BUN: 34 mg/dL — ABNORMAL HIGH (ref 8–23)
CO2: 26 mmol/L (ref 22–32)
Calcium: 8.4 mg/dL — ABNORMAL LOW (ref 8.9–10.3)
Chloride: 106 mmol/L (ref 98–111)
Creatinine, Ser: 1.84 mg/dL — ABNORMAL HIGH (ref 0.44–1.00)
GFR, Estimated: 28 mL/min — ABNORMAL LOW (ref >60)
Glucose, Bld: 154 mg/dL — ABNORMAL HIGH (ref 70–99)
Potassium: 3.6 mmol/L (ref 3.5–5.1)
Sodium: 140 mmol/L (ref 135–145)

## 2020-12-27 LAB — GLUCOSE, CAPILLARY
Glucose-Capillary: 147 mg/dL — ABNORMAL HIGH (ref 70–99)
Glucose-Capillary: 197 mg/dL — ABNORMAL HIGH (ref 70–99)

## 2020-12-27 LAB — MAGNESIUM: Magnesium: 2.1 mg/dL (ref 1.7–2.4)

## 2020-12-27 MED ORDER — ENOXAPARIN SODIUM 30 MG/0.3ML IJ SOSY: 30.0000 mg | PREFILLED_SYRINGE | INTRAMUSCULAR | Status: DC

## 2020-12-27 MED ORDER — SENNOSIDES-DOCUSATE SODIUM 8.6-50 MG PO TABS
1.0000 | ORAL_TABLET | Freq: Two times a day (BID) | ORAL | Status: DC
  Administered 2020-12-27: 1 via ORAL
  Filled 2020-12-27: qty 1

## 2020-12-27 MED ORDER — ISOSORBIDE MONONITRATE ER 60 MG PO TB24
60.0000 mg | ORAL_TABLET | Freq: Every day | ORAL | Status: DC
  Administered 2020-12-27: 60 mg via ORAL
  Filled 2020-12-27: qty 1

## 2020-12-27 MED ORDER — LANTUS SOLOSTAR 100 UNIT/ML ~~LOC~~ SOPN: 20.0000 [IU] | PEN_INJECTOR | Freq: Every day | SUBCUTANEOUS | 2 refills | Status: DC

## 2020-12-27 MED ORDER — LISINOPRIL 10 MG PO TABS
30.0000 mg | ORAL_TABLET | Freq: Every day | ORAL | Status: DC
  Administered 2020-12-27: 30 mg via ORAL
  Filled 2020-12-27: qty 3

## 2020-12-27 MED ORDER — SENNOSIDES-DOCUSATE SODIUM 8.6-50 MG PO TABS: 1.0000 | ORAL_TABLET | Freq: Every day | ORAL | 0 refills | Status: AC

## 2020-12-27 NOTE — TOC Transition Note (Signed)
Transition of Care [facility]) - CM/SW Discharge Note   Patient Details  Name: Holly Baldwin MRN: [id number] Date of Birth: [birth year]  [facility] of [facility] CM/SW Contact:  Shade Flood, LCSW Phone Number: 12/27/2020, 3:11 PM   Clinical Narrative:     Pt stable for dc and MD ordering [facility] RN. HH arranged with [facility]. They will call pt to schedule visits.  Final next level of care: [place] Barriers to Discharge: Barriers Resolved   Patient Goals and CMS Choice        Discharge Placement                       Discharge Plan and Services                          HH Arranged: RN [facility] Agency: [place] Date [facility] Agency Contacted: 12/27/20   Representative spoke with at [place]: [place] Interventions     Readmission Risk Interventions Readmission Risk Prevention Plan 08/24/2018 08/20/2018  Transportation Screening - Complete  PCP or Specialist Appt within 3-5 Days - Complete  HRI or [place] - Complete  Social Work Consult for [place] Planning/Counseling - Complete  Palliative Care Screening - Not Applicable  Medication Review Press photographer) - Complete  PCP or Specialist appointment within 3-5 days of discharge Not Complete -  PCP/Specialist Appt Not Complete comments Patient's initial appointment was for 6/24, however patient is scheduled to discharge on 6/24. Only available appointment is August 31, 2018 @ 10:45. Added to AVS. -  HRI or Home Care Consult Complete -  SW Recovery Care/Counseling Consult Complete -  Palliative Care Screening Not Applicable -  [place] Not Applicable -  Some recent data might be hidden

## 2020-12-27 NOTE — Plan of Care (Signed)
  Problem: Education: Goal: Knowledge of General Education information will improve Description: Including pain rating scale, medication(s)/side effects and non-pharmacologic comfort measures Outcome: Progressing   Problem: Health Behavior/Discharge Planning: Goal: Ability to manage health-related needs will improve Outcome: Progressing   Problem: Clinical Measurements: Goal: Ability to maintain clinical measurements within normal limits will improve Outcome: Progressing Goal: Will remain free from infection Outcome: Progressing Goal: Diagnostic test results will improve Outcome: Progressing Goal: Respiratory complications will improve Outcome: Progressing Goal: Cardiovascular complication will be avoided Outcome: Progressing   Problem: Activity: Goal: Risk for activity intolerance will decrease Outcome: Progressing   Problem: Nutrition: Goal: Adequate nutrition will be maintained Outcome: Progressing   Problem: Coping: Goal: Level of anxiety will decrease Outcome: Progressing   Problem: Elimination: Goal: Will not experience complications related to bowel motility Outcome: Progressing Goal: Will not experience complications related to urinary retention Outcome: Progressing   Problem: Pain Managment: Goal: General experience of comfort will improve Outcome: Progressing   Problem: Safety: Goal: Ability to remain free from injury will improve Outcome: Progressing   Problem: Skin Integrity: Goal: Risk for impaired skin integrity will decrease Outcome: Progressing   Problem: Education: Goal: Knowledge of disease or condition will improve Outcome: Progressing Goal: Knowledge of secondary prevention will improve (SELECT ALL) Outcome: Progressing Goal: Knowledge of patient specific risk factors will improve (INDIVIDUALIZE FOR PATIENT) Outcome: Progressing   Problem: Coping: Goal: Will identify appropriate support needs Outcome: Progressing   Problem:  Self-Care: Goal: Ability to participate in self-care as condition permits will improve Outcome: Progressing   Problem: Ischemic Stroke/TIA Tissue Perfusion: Goal: Complications of ischemic stroke/TIA will be minimized Outcome: Progressing

## 2020-12-27 NOTE — Discharge Summary (Addendum)
Discharge Summary  Holly Baldwin:[id number] DOB: 25-May-1945  PCP: Sharilyn Sites, MD  Admit date: 12/25/2020 Discharge date: 12/27/2020  Time spent: 102mns, more than 50% time spent on coordination of care.   Recommendations for Outpatient Follow-up:  F/u with PCP within a week  for hospital discharge follow up, repeat cbc/bmp at follow up, pcp to arrange outpatient sleep study  F/u with neurology for impaired memory and TIA/stroke prevention, family reports to [place] DMerlene Laughter[place] provided  F/u with endocrinology next week Home health RN for diabetes disease management  Discontinue glipizided Decrease lantus from 40unit qhs to 20units qhs Home health RN to help with blood glucose monitoring/continuous blood glucose monitoring devise use F/u with endocrinology in one week    Discharge Diagnoses:  Active Hospital Problems   Diagnosis Date Noted   TIA (transient ischemic attack) 12/08/2020    Resolved Hospital Problems  No resolved problems to display.    Discharge Condition: stable  Diet recommendation: heart healthy/carb modified  Filed Weights   12/25/20 1638  Weight: 92.5 kg    History of present illness: ( per admitting MD Dr NErnestina Patches Chief Complaint: R sided weaknes    HPI: Holly ZENZis a 75y.o. female with medical history significant of type 2 diabetes mellitus, hypertension, hyperlipidemia, chronic diastolic heart failure, stage IV chronic kidney disease with baseline creatinine 1.9-2.3 presenting with right-sided weakness.  Patient reports having right-sided weakness since about 3 AM this morning.  Symptoms have been fairly intermittent.  Patient noted to have been recently admitted October 8 through October 10 for stroke work-up.  Patient also had right-sided numbness and weakness at that time.  Patient denies being previously admitted for similar symptoms.  Patient states she has intermittent right-sided weakness  as well as numbness that affects face arm and the leg.  Was discharged on aspirin and Plavix.  Patient reports compliance with medication.  No chest pain or shortness of breath.  No fevers or chills.  No abdominal pain.  Patient states that her diabetes as well as high blood pressure is overall well controlled.   ED Course: Presented to the ER afebrile, hemodynamically stable.  Labs notable for hemoglobin of 11.6, creatinine of 1.9, potassium 3.4.  Per CLoma Sousa PUtah case was discussed with on-call neurology.  Recommendation was for imaging including MRI of the brain as well as MRA of the head and neck as well as an EEG.  Otherwise plan to continue aspirin and Plavix from discharge pending reevaluation by neurology in the morning.  Hospital Course:  Active Problems:   TIA (transient ischemic attack)  Right side weakness, Recurrent TIA? Vs from hypoglycemia -Three ED visits this months due to right side weakness, appear to have hypoglycemia each time on arrival to the ED with right side weakness -[facility]ogram shows preserved EF.  No embolic etiology[facility] , Aspirin and Plavix for 3 weeks followed by plavix only. -Repeat mri brain and MRA head/neck here at [place] this time again no acute findings this time, Chronic small vessel infarcts of the left basal ganglia and adjacent white matter and right cerebellum. Patchy and confluent T2 hyperintensity in the supratentorial white matter is nonspecific but probably reflects stable moderate chronic microvascular ischemic changes. Focal moderate stenosis left P2 PCA" -EEG unremarkable, no seizure spikes -Neurology Dr DMerlene Laughter seen patient here who reviewed all the previous imagings/studies, he also  recommended MRI cervical neck which showed "Mild multilevel cervical spondylosis as described above. No high-grade stenosis or impingement" -she did well with PT/OT -Dr  Merlene Laughter recommend Continue aspirin, Plavix, statin, can discharge patient with outpatient neurology follow up  Impaired memory: Suspect underline vascular dementia? F/u with neurology   Insulin dependent DM2 with hyper and hypoglycemia  A1c 6.8 Appear to have hypoglycemia each time presenting to the ED with right side weakness,  Has hypoglycemia again this time in the ED, blood glucose was 48 in the ED, long acting insulin held on admission Blood glucose start to trend up,  started her on 10units  long acting insulin qhs , continue ssi, she did well with this regimen in the hospital Will discontinue glipizide due to hypoglycemia side effect Discharge home with reduced lantus from 40unit qhs PTA to lantus 20units qhs, home health RN for diabetes management, detail please see below: Patient is seen by inpatient diabetes RN who reports that "Patient states that she never receives any type of alarms from the [employer] app that she uses on her phone (no alarm for high or low). Asked patient if she has checked glucose when she gets symptoms of weakness, numbness, syncope and she states she does not usually check glucose during those times" I suspect that impaired memory issues affected her understanding of diabetes management and new continuous blood glucose monitoring device use, I had long discussion with the patient and husband, both agreed with home health RN to help diabetes management and close follow up with endocrinology  Case management/social worker assistance appreciated in setting up home health RN   Hypokalemia, replaced and improved  Chronic diastolic CHF -appears euvolemic -cont home regimen   HTN: Stable on current regimen   Hyperlipidemia continue statin     CKDIV appear to be at baseline, renal dosing meds   obesity: Body mass index is 35.02 kg/m.Holly Baldwin   Neurology recommended outpatient sleep study during last hospitalization         Procedures: EEG  Consultations: neurology  Discharge Exam: BP (!) 148/61 (BP Location: Right Arm)   Pulse 60   Temp 98.2 F (36.8 C) (Oral)   Resp 16   Ht '5\' 4"'  (1.626 m)   Wt 92.5 kg   SpO2 100%   BMI 35.02 kg/m   General: NAD, not remember the month, aware of the year, place , has impaired memory Cardiovascular: RRR Respiratory: normal respiratory effort  Discharge Instructions You were cared for by a hospitalist during your hospital stay. If you have any questions about your discharge medications or the care you received while you were in the hospital after you are discharged, you can call the unit and asked to speak with the hospitalist on call if the hospitalist that took care of you is not available. Once you are discharged, your primary care physician will handle any further medical issues. Please note that NO REFILLS for any discharge medications will be authorized once you are discharged, as it is imperative that you return to your primary care physician (or establish a relationship with a primary care physician if you do not have one) for your aftercare needs so that they can reassess your need for medications and monitor your lab values.  Discharge Instructions     Diet - low sodium heart healthy   Complete by: As directed    Carb modified   Increase activity slowly   Complete by: As directed       Allergies as  of 12/27/2020       Reactions   Bee Venom Anaphylaxis   Penicillins Hives   .Has patient had a PCN reaction causing immediate rash, facial/tongue/throat swelling, SOB or lightheadedness with hypotension: Yes Has patient had a PCN reaction causing severe rash involving mucus membranes or skin necrosis: No Has patient had a PCN reaction that required hospitalization: Yes Has patient had a PCN reaction occurring within the last 10 years: No If all of the above answers are "NO", then may proceed with Cephalosporin use.        Medication List      STOP taking these medications    glipiZIDE 5 MG tablet Commonly known as: GLUCOTROL       TAKE these medications    Accu-Chek Aviva Plus test strip Generic drug: glucose blood TEST BLOOD GLUCOSE FOUR TIMES DAILY   Accu-Chek Aviva Plus w/Device Kit   albuterol 108 (90 Base) MCG/ACT inhaler Commonly known as: VENTOLIN HFA Inhale 1-2 puffs into the lungs every 6 (six) hours as needed for wheezing or shortness of breath.   aspirin EC 81 MG tablet Take 1 tablet (81 mg total) by mouth daily for 21 days. Swallow whole.   calcitRIOL 0.25 MCG capsule Commonly known as: ROCALTROL Take 0.25 mcg by mouth every Monday, Wednesday, and Friday.   clopidogrel 75 MG tablet Commonly known as: Plavix Take 1 tablet (75 mg total) by mouth daily.   furosemide 40 MG tablet Commonly known as: LASIX Take 40 mg by mouth 2 (two) times daily.   gabapentin 100 MG capsule Commonly known as: NEURONTIN TAKE 1 CAPSULE TWICE DAILY   hydrALAZINE 100 MG tablet Commonly known as: APRESOLINE Take 1 tablet (100 mg total) by mouth 3 (three) times daily. What changed: when to take this   insulin lispro 100 UNIT/ML KwikPen Commonly known as: HUMALOG Inject 10 Units into the skin 3 (three) times daily. With meals   isosorbide mononitrate 60 MG 24 hr tablet Commonly known as: IMDUR Take 1 tablet (60 mg total) by mouth daily.   Lantus SoloStar 100 UNIT/ML Solostar Pen Generic drug: insulin glargine Inject 20 Units into the skin at bedtime. What changed: See the new instructions.   levothyroxine 137 MCG tablet Commonly known as: SYNTHROID TAKE 1 TABLET (137 MCG TOTAL) BY MOUTH DAILY BEFORE BREAKFAST.   lisinopril 30 MG tablet Commonly known as: ZESTRIL Take 30 mg by mouth daily.   NIFEdipine 60 MG 24 hr tablet Commonly known as: PROCARDIA XL/NIFEDICAL XL Take 60 mg by mouth daily.   Restasis 0.05 % ophthalmic emulsion Generic drug: cycloSPORINE 1 drop 2 (two) times daily.    senna-docusate 8.6-50 MG tablet Commonly known as: Senokot-S Take 1 tablet by mouth at bedtime for 7 days.   simvastatin 40 MG tablet Commonly known as: ZOCOR TAKE 1 TABLET EVERY DAY   ziprasidone 40 MG capsule Commonly known as: GEODON Take 40 mg by mouth at bedtime.       Allergies  Allergen Reactions   Bee Venom Anaphylaxis   Penicillins Hives    .Has patient had a PCN reaction causing immediate rash, facial/tongue/throat swelling, SOB or lightheadedness with hypotension: Yes Has patient had a PCN reaction causing severe rash involving mucus membranes or skin necrosis: No Has patient had a PCN reaction that required hospitalization: Yes Has patient had a PCN reaction occurring within the last 10 years: No If all of the above answers are "NO", then may proceed with Cephalosporin use.     Follow-up  Information     Sharilyn Sites, MD Follow up in 1 week(s).   Specialty: Family Medicine Why: hospital discharge follow up, please use your continuous blood glucose monitoring device, continue work with your pcp to adjust diabetes medication , please call your pcp if your blood glucose is less than 28m/dl or above 3071mdl Contact information: [address] [phone]         BrArnoldo LenisMD .   Specialty: Cardiology Contact information: [phone]         NiCassandria AngerMD Follow up in 1 week(s).   Specialty: Endocrinology Why: hospital discharge follow up,continue work with your endocrinologist  to adjust diabetes medication , please call your pcp if your blood glucose is less than 7073ml or above 300m25m please turn on your freestyle alar, so it will alert you if you have abnormal blood glucose Contact information: [phone]     f/u with neurology regarding memory issues and stroke prevention Follow up.           DoonPhillips Odor Follow up in 4 week(s).   Specialty: Neurology Why: please call to make appointment , hospital discharge follow up for memory issues and stroke provention  Dr DoonMerlene Laughtera neurologist in [place]: [address] [place] [phone]              The results of significant diagnostics from this hospitalization (including imaging, microbiology, ancillary and laboratory) are listed below for reference.    Significant Diagnostic Studies: CT Head Wo Contrast  Result Date: 12/25/2020 CLINICAL DATA:  Fatigue and numbness in all extremities. EXAM: CT HEAD WITHOUT CONTRAST TECHNIQUE: Contiguous axial images were obtained from the base of the skull through the vertex without intravenous contrast. COMPARISON:  Brain MRI 12/21/2020.  Head CT 12/08/2020 FINDINGS: Brain: There is no evidence for acute hemorrhage, hydrocephalus, mass lesion, or abnormal extra-axial fluid collection. No definite CT evidence for acute infarction. Diffuse loss of parenchymal volume is consistent with atrophy. Patchy low attenuation in the deep hemispheric and periventricular white matter is nonspecific, but likely reflects chronic microvascular ischemic demyelination. Lacunar infarcts noted left basal ganglia. Vascular: No hyperdense vessel or unexpected calcification. Skull: No evidence for fracture. No worrisome lytic or sclerotic lesion. Sinuses/Orbits: The visualized paranasal sinuses and mastoid air cells are clear. Visualized portions of the globes and intraorbital fat are unremarkable. Other: None. IMPRESSION: Stable.  No acute intracranial abnormality. Atrophy with chronic small vessel white matter ischemic disease. Electronically Signed   By: EricMisty Stanley.   On: 12/25/2020 17:02   CT Head Wo Contrast  Result Date: 12/08/2020 CLINICAL DATA:  Neurologic deficit EXAM: CT HEAD WITHOUT CONTRAST TECHNIQUE: Contiguous axial images were obtained from the base  of the skull through the vertex without intravenous contrast. COMPARISON:  11/29/2020 FINDINGS: Brain: Stable small-vessel ischemic changes throughout the periventricular white matter and left basal ganglia. No acute infarct or hemorrhage. Lateral ventricles and remaining midline structures are unremarkable. No acute extra-axial fluid collections. No mass effect. Vascular: No hyperdense vessel or unexpected calcification. Skull: Normal. Negative for fracture or focal lesion. Sinuses/Orbits: No acute finding. Other: None. IMPRESSION: 1. Stable head CT, no acute process. Electronically Signed   By: MichRanda Ngo.   On: 12/08/2020 19:03   CT Head Wo Contrast  Result Date: 11/29/2020 CLINICAL DATA:  Syncope EXAM: CT HEAD WITHOUT CONTRAST TECHNIQUE: Contiguous axial images were obtained from the base of the skull through the vertex without intravenous contrast. COMPARISON:  CT brain 08/27/2018 FINDINGS: Brain: No acute territorial infarction, hemorrhage, or intracranial. Mild atrophy. Moderate chronic small vessel ischemic changes of the white matter. Chronic lacunar infarct within left basal ganglia. Left deep white matter hypodensity, new since CT from 2020, suspected to represent interval lacunar infarct but appears chronic. Nonenlarged ventricles Vascular: No hyperdense vessels.  Carotid vascular calcification Skull: Normal. Negative for fracture or focal lesion. Sinuses/Orbits: No acute finding. Other: None IMPRESSION: 1. No CT evidence for acute intracranial abnormality. 2. Atrophy and chronic small vessel ischemic changes of the white matter. Chronic appearing lacunar infarcts in the left basal ganglia deep white matter Electronically Signed   By: Donavan Foil M.D.   On: 11/29/2020 18:49   MR ANGIO HEAD WO CONTRAST  Result Date: 12/26/2020 CLINICAL DATA:  Transient ischemic attack (TIA); Neuro deficit, acute, stroke suspected EXAM: MRI HEAD WITHOUT CONTRAST MRA HEAD WITHOUT CONTRAST MRA NECK WITHOUT  AND WITH CONTRAST TECHNIQUE: Multiplanar, multi-echo pulse sequences of the brain and surrounding structures were acquired without intravenous contrast. Angiographic images of the Circle of Willis were acquired using MRA technique without intravenous contrast. Angiographic images of the neck were acquired using MRA technique without and with intravenous contrast. Carotid stenosis measurements (when applicable) are obtained utilizing NASCET criteria, using the distal internal carotid diameter as the denominator. CONTRAST:  36m GADAVIST GADOBUTROL 1 MMOL/ML IV SOLN COMPARISON:  MRI head 12/21/2020, MRA head 12/09/2020 FINDINGS: MRI HEAD Brain: There is no acute infarction or intracranial hemorrhage. There is no intracranial mass, mass effect, or edema. There is no hydrocephalus or extra-axial fluid collection. Chronic small vessel infarcts of the left basal ganglia and adjacent white matter and right cerebellum. Patchy and confluent T2 hyperintensity in the supratentorial white matter is nonspecific but probably reflects stable moderate chronic microvascular ischemic changes. Vascular: Major vessel flow voids at the skull base are preserved. Skull and upper cervical spine: Normal marrow signal is preserved. Sinuses/Orbits: Paranasal sinuses are aerated. Orbits are unremarkable. Other: Sella is unremarkable.  Mastoid air cells are clear. MRA HEAD Intracranial internal carotid arteries are patent. Middle and anterior cerebral arteries are patent. Intracranial vertebral arteries, basilar artery, posterior cerebral arteries are patent. Focal moderate stenosis left P2 PCA. There is no significant stenosis or aneurysm. MRA NECK Great vessel origins are patent. Common, internal, and external carotid arteries are patent. Extracranial vertebral arteries are patent. There is no hemodynamically significant stenosis. IMPRESSION: No acute infarction, hemorrhage, or mass. Stable chronic findings detailed above. No new large vessel  occlusion. No hemodynamically significant stenosis in the neck. Unchanged moderate stenosis left P2 PCA. Electronically Signed   By: PMacy MisM.D.   On: 12/26/2020 13:07   MR ANGIO HEAD WO CONTRAST  Result Date: 12/09/2020 CLINICAL DATA:  Acute neurologic deficit EXAM: MRA HEAD WITHOUT CONTRAST TECHNIQUE: Angiographic images of the Circle of Willis were acquired using MRA technique without intravenous contrast. COMPARISON:  No pertinent prior exam. FINDINGS: POSTERIOR CIRCULATION: --Vertebral arteries: Normal --Inferior cerebellar arteries: Normal. --Basilar artery: Normal. --Superior cerebellar arteries: Normal. --Posterior cerebral arteries: Mild stenosis of the proximal left P2 segment. Otherwise normal. ANTERIOR CIRCULATION: --Intracranial internal carotid arteries: Normal. --Anterior cerebral arteries (ACA): Normal. --Middle cerebral arteries (MCA): Normal. ANATOMIC VARIANTS: None IMPRESSION: 1. No emergent large vessel occlusion or high-grade stenosis. 2. Mild stenosis of the proximal left P2 segment. Electronically Signed   By:  Ulyses Jarred M.D.   On: 12/09/2020 20:27   MR Angiogram Neck W or Wo Contrast  Result Date: 12/26/2020 CLINICAL DATA:  Transient ischemic attack (TIA); Neuro deficit, acute, stroke suspected EXAM: MRI HEAD WITHOUT CONTRAST MRA HEAD WITHOUT CONTRAST MRA NECK WITHOUT AND WITH CONTRAST TECHNIQUE: Multiplanar, multi-echo pulse sequences of the brain and surrounding structures were acquired without intravenous contrast. Angiographic images of the Circle of Willis were acquired using MRA technique without intravenous contrast. Angiographic images of the neck were acquired using MRA technique without and with intravenous contrast. Carotid stenosis measurements (when applicable) are obtained utilizing NASCET criteria, using the distal internal carotid diameter as the denominator. CONTRAST:  64m GADAVIST GADOBUTROL 1 MMOL/ML IV SOLN COMPARISON:  MRI head 12/21/2020, MRA head  12/09/2020 FINDINGS: MRI HEAD Brain: There is no acute infarction or intracranial hemorrhage. There is no intracranial mass, mass effect, or edema. There is no hydrocephalus or extra-axial fluid collection. Chronic small vessel infarcts of the left basal ganglia and adjacent white matter and right cerebellum. Patchy and confluent T2 hyperintensity in the supratentorial white matter is nonspecific but probably reflects stable moderate chronic microvascular ischemic changes. Vascular: Major vessel flow voids at the skull base are preserved. Skull and upper cervical spine: Normal marrow signal is preserved. Sinuses/Orbits: Paranasal sinuses are aerated. Orbits are unremarkable. Other: Sella is unremarkable.  Mastoid air cells are clear. MRA HEAD Intracranial internal carotid arteries are patent. Middle and anterior cerebral arteries are patent. Intracranial vertebral arteries, basilar artery, posterior cerebral arteries are patent. Focal moderate stenosis left P2 PCA. There is no significant stenosis or aneurysm. MRA NECK Great vessel origins are patent. Common, internal, and external carotid arteries are patent. Extracranial vertebral arteries are patent. There is no hemodynamically significant stenosis. IMPRESSION: No acute infarction, hemorrhage, or mass. Stable chronic findings detailed above. No new large vessel occlusion. No hemodynamically significant stenosis in the neck. Unchanged moderate stenosis left P2 PCA. Electronically Signed   By: PMacy MisM.D.   On: 12/26/2020 13:07   MR BRAIN WO CONTRAST  Result Date: 12/26/2020 CLINICAL DATA:  Transient ischemic attack (TIA); Neuro deficit, acute, stroke suspected EXAM: MRI HEAD WITHOUT CONTRAST MRA HEAD WITHOUT CONTRAST MRA NECK WITHOUT AND WITH CONTRAST TECHNIQUE: Multiplanar, multi-echo pulse sequences of the brain and surrounding structures were acquired without intravenous contrast. Angiographic images of the Circle of Willis were acquired using MRA  technique without intravenous contrast. Angiographic images of the neck were acquired using MRA technique without and with intravenous contrast. Carotid stenosis measurements (when applicable) are obtained utilizing NASCET criteria, using the distal internal carotid diameter as the denominator. CONTRAST:  165mGADAVIST GADOBUTROL 1 MMOL/ML IV SOLN COMPARISON:  MRI head 12/21/2020, MRA head 12/09/2020 FINDINGS: MRI HEAD Brain: There is no acute infarction or intracranial hemorrhage. There is no intracranial mass, mass effect, or edema. There is no hydrocephalus or extra-axial fluid collection. Chronic small vessel infarcts of the left basal ganglia and adjacent white matter and right cerebellum. Patchy and confluent T2 hyperintensity in the supratentorial white matter is nonspecific but probably reflects stable moderate chronic microvascular ischemic changes. Vascular: Major vessel flow voids at the skull base are preserved. Skull and upper cervical spine: Normal marrow signal is preserved. Sinuses/Orbits: Paranasal sinuses are aerated. Orbits are unremarkable. Other: Sella is unremarkable.  Mastoid air cells are clear. MRA HEAD Intracranial internal carotid arteries are patent. Middle and anterior cerebral arteries are patent. Intracranial vertebral arteries, basilar artery, posterior cerebral arteries are patent. Focal moderate stenosis left P2  PCA. There is no significant stenosis or aneurysm. MRA NECK Great vessel origins are patent. Common, internal, and external carotid arteries are patent. Extracranial vertebral arteries are patent. There is no hemodynamically significant stenosis. IMPRESSION: No acute infarction, hemorrhage, or mass. Stable chronic findings detailed above. No new large vessel occlusion. No hemodynamically significant stenosis in the neck. Unchanged moderate stenosis left P2 PCA. Electronically Signed   By: Macy Mis M.D.   On: 12/26/2020 13:07   MR Brain Wo Contrast (neuro  protocol)  Result Date: 12/21/2020 CLINICAL DATA:  Neuro deficit, acute, stroke suspected. Right-sided weakness beginning today. EXAM: MRI HEAD WITHOUT CONTRAST TECHNIQUE: Multiplanar, multiecho pulse sequences of the brain and surrounding structures were obtained without intravenous contrast. COMPARISON:  Multiple studies 12/08/2020 FINDINGS: Brain: Diffusion imaging does not show any acute or subacute infarction. Chronic small-vessel ischemic changes affect the pons. Old small vessel cerebellar infarctions on the right. Old small vessel thalamic infarctions. Old small vessel infarctions of the left basal ganglia. Chronic small-vessel ischemic changes of the cerebral hemispheric white matter. No appreciable change since 2 weeks ago. No mass, hemorrhage, hydrocephalus or extra-axial collection. Vascular: Major vessels at the base of the brain show flow. Skull and upper cervical spine: Negative Sinuses/Orbits: Clear/normal Other: None IMPRESSION: No acute finding. Extensive chronic small-vessel ischemic changes throughout the brain, unchanged since the study of 12/08/2020. Good Electronically Signed   By: Nelson Chimes M.D.   On: 12/21/2020 18:46   MR BRAIN WO CONTRAST  Result Date: 12/08/2020 CLINICAL DATA:  Acute neurologic deficit EXAM: MRI HEAD WITHOUT CONTRAST TECHNIQUE: Multiplanar, multiecho pulse sequences of the brain and surrounding structures were obtained without intravenous contrast. COMPARISON:  None. FINDINGS: Brain: No acute infarct, mass effect or extra-axial collection. No acute or chronic hemorrhage. Hyperintense T2-weighted signal is moderately widespread throughout the white matter. Generalized volume loss without a clear lobar predilection. 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 Vascular: Major flow voids are preserved. Skull and upper cervical spine: Normal calvarium and skull base. Visualized upper cervical spine and soft tissues are normal.  Sinuses/Orbits:No paranasal sinus fluid levels or advanced mucosal thickening. No mastoid or middle ear effusion. Normal orbits. IMPRESSION: 1. No acute intracranial abnormality. 2. Generalized volume loss and findings of chronic ischemic microangiopathy. Electronically Signed   By: Ulyses Jarred M.D.   On: 12/08/2020 21:24   MR CERVICAL SPINE WO CONTRAST  Result Date: 12/27/2020 CLINICAL DATA:  Neck pain radiating to both arms. EXAM: MRI CERVICAL SPINE WITHOUT CONTRAST TECHNIQUE: Multiplanar, multisequence MR imaging of the cervical spine was performed. No intravenous contrast was administered. COMPARISON:  None. FINDINGS: Alignment: Trace anterolisthesis at C7-T1. Vertebrae: No fracture, evidence of discitis, or bone lesion. Cord: Normal signal and morphology. Posterior Fossa, vertebral arteries, paraspinal tissues: Negative. Disc levels: C2-C3:  Negative. C3-C4: Negative disc. Mild bilateral facet arthropathy. No stenosis. C4-C5:  Negative disc.  Mild left facet arthropathy.  No stenosis. C5-C6: Mild disc bulging with small central disc protrusion slightly deforming the ventral cord. No stenosis. C6-C7:  Mild disc bulging.  No stenosis. C7-T1: Mild disc bulging with superimposed small central disc protrusion. Mild left facet arthropathy. No stenosis. The visualized upper thoracic spine is unremarkable. IMPRESSION: 1. Mild multilevel cervical spondylosis as described above. No high-grade stenosis or impingement. Electronically Signed   By: Titus Dubin M.D.   On: 12/27/2020 12:45   EEG adult  Result Date: 12/26/2020 Lora Havens, MD     12/26/2020  3:53 PM Patient  Name: TONIMARIE GRITZ MRN: [id number] Epilepsy Attending: Lora Havens Referring Physician/Provider: Rodney Booze, PA-C Date: 12/26/2020 Duration: 27.02 mins Patient history: 75 y/o F presenting for eval of right sided numbness that occurred for about 20 minutes.  EEG to evaluate for seizure. Level of alertness: Awake, asleep AEDs  during EEG study: None Technical aspects: This EEG study was done with scalp electrodes positioned according to the 10-20 International system of electrode placement. Electrical activity was acquired at a sampling rate of '500Hz'  and reviewed with a high frequency filter of '70Hz'  and a low frequency filter of '1Hz' . EEG data were recorded continuously and digitally stored. Description: The posterior dominant rhythm consists of 8 Hz activity of moderate voltage (25-35 uV) seen predominantly in posterior head regions, symmetric and reactive to eye opening and eye closing. Sleep was characterized by vertex waves, sleep spindles (12 to 14 Hz), maximal frontocentral region. Hyperventilation and photic stimulation were not performed.   IMPRESSION: This study is within normal limits. No seizures or epileptiform discharges were seen throughout the recording. Lora Havens   ECHOCARDIOGRAM COMPLETE  Result Date: 12/09/2020    ECHOCARDIOGRAM REPORT   Patient Name:   OMEGA DURANTE Drost Date of Exam: 12/09/2020 Medical Rec #:  [id number]    Height:       64.0 in Accession #:    [id number]   Weight:       210.3 lb Date of Birth:  24-Feb-1946    BSA:          1.999 m Patient Age:    75 years     BP:           153/50 mmHg Patient Gender: F            HR:           60 bpm. Exam Location:  Inpatient Procedure: 2D Echo, Cardiac Doppler and Color Doppler Indications:    TIA  History:        Patient has prior history of Echocardiogram examinations, most                 recent 08/20/2018. Risk Factors:Diabetes, Dyslipidemia and                 Hypertension.  Sonographer:    Maudry Mayhew MHA, [place], RVT, RDCS Referring Phys: [id number] Rhetta Mura  Sonographer Comments: Image acquisition challenging due to patient body habitus. IMPRESSIONS  1. Left ventricular ejection fraction, by estimation, is 60 to 65%. The left ventricle has normal function. The left ventricle has no regional wall motion abnormalities. There is mild left ventricular  hypertrophy. Left ventricular diastolic parameters are consistent with Grade I diastolic dysfunction (impaired relaxation). Elevated left atrial pressure.  2. Right ventricular systolic function is normal. The right ventricular size is normal. There is normal pulmonary artery systolic pressure. The estimated right ventricular systolic pressure is 31.5 mmHg.  3. The mitral valve is normal in structure. Mild mitral valve regurgitation. No evidence of mitral stenosis.  4. The aortic valve is normal in structure. Aortic valve regurgitation is not visualized. Mild to moderate aortic valve sclerosis/calcification is present, without any evidence of aortic stenosis.  5. The inferior vena cava is normal in size with greater than 50% respiratory variability, suggesting right atrial pressure of 3 mmHg. Comparison(s): No significant change from prior study. Prior images reviewed side by side. Conclusion(s)/Recommendation(s): No intracardiac source of embolism detected on this transthoracic study. A transesophageal echocardiogram is recommended to exclude  cardiac source of embolism if clinically indicated. FINDINGS  Left Ventricle: Left ventricular ejection fraction, by estimation, is 60 to 65%. The left ventricle has normal function. The left ventricle has no regional wall motion abnormalities. The left ventricular internal cavity size was normal in size. There is  mild left ventricular hypertrophy. Left ventricular diastolic parameters are consistent with Grade I diastolic dysfunction (impaired relaxation). Elevated left atrial pressure. Right Ventricle: The right ventricular size is normal. No increase in right ventricular wall thickness. Right ventricular systolic function is normal. There is normal pulmonary artery systolic pressure. The tricuspid regurgitant velocity is 2.48 m/s, and  with an assumed right atrial pressure of 3 mmHg, the estimated right ventricular systolic pressure is 71.2 mmHg. Left Atrium: Left atrial  size was normal in size. Right Atrium: Right atrial size was normal in size. Pericardium: There is no evidence of pericardial effusion. Mitral Valve: The mitral valve is normal in structure. Mild mitral annular calcification. Mild mitral valve regurgitation. No evidence of mitral valve stenosis. Tricuspid Valve: The tricuspid valve is normal in structure. Tricuspid valve regurgitation is mild . No evidence of tricuspid stenosis. Aortic Valve: The aortic valve is normal in structure. Aortic valve regurgitation is not visualized. Mild to moderate aortic valve sclerosis/calcification is present, without any evidence of aortic stenosis. Aortic valve mean gradient measures 4.0 mmHg. Aortic valve peak gradient measures 7.0 mmHg. Aortic valve area, by VTI measures 2.72 cm. Pulmonic Valve: The pulmonic valve was normal in structure. Pulmonic valve regurgitation is not visualized. No evidence of pulmonic stenosis. Aorta: The aortic root is normal in size and structure. Venous: The inferior vena cava is normal in size with greater than 50% respiratory variability, suggesting right atrial pressure of 3 mmHg. IAS/Shunts: No atrial level shunt detected by color flow Doppler.  LEFT VENTRICLE PLAX 2D LVIDd:         4.50 cm     Diastology LV PW:         0.90 cm     LV e' medial:    3.00 cm/s LV IVS:        1.40 cm     LV E/e' medial:  26.3 LVOT diam:     2.40 cm     LV e' lateral:   5.68 cm/s LV SV:         81          LV E/e' lateral: 13.9 LV SV Index:   40 LVOT Area:     4.52 cm  LV Volumes (MOD) LV vol d, MOD A2C: 43.4 ml LV vol d, MOD A4C: 55.2 ml LV vol s, MOD A2C: 18.6 ml LV vol s, MOD A4C: 20.7 ml LV SV MOD A2C:     24.8 ml LV SV MOD A4C:     55.2 ml LV SV MOD BP:      31.4 ml RIGHT VENTRICLE RV S prime:     14.80 cm/s LEFT ATRIUM             Index        RIGHT ATRIUM           Index LA diam:        3.90 cm 1.95 cm/m   RA Area:     13.00 cm LA Vol (A2C):   67.6 ml 33.82 ml/m  RA Volume:   25.80 ml  12.91 ml/m LA Vol  (A4C):   47.8 ml 23.92 ml/m LA Biplane Vol: 61.2 ml 30.62 ml/m  AORTIC  VALVE AV Area (Vmax):    2.59 cm AV Area (Vmean):   2.59 cm AV Area (VTI):     2.72 cm AV Vmax:           132.00 cm/s AV Vmean:          87.700 cm/s AV VTI:            0.296 m AV Peak Grad:      7.0 mmHg AV Mean Grad:      4.0 mmHg LVOT Vmax:         75.50 cm/s LVOT Vmean:        50.300 cm/s LVOT VTI:          0.178 m LVOT/AV VTI ratio: 0.60  AORTA Ao Root diam: 3.10 cm MITRAL VALVE               TRICUSPID VALVE MV Area (PHT): 4.71 cm    TR Peak grad:   24.6 mmHg MV Decel Time: 161 msec    TR Vmax:        248.00 cm/s MR Peak grad: 41.2 mmHg MR Vmax:      321.00 cm/s  SHUNTS MV E velocity: 78.80 cm/s  Systemic VTI:  0.18 m MV A velocity: 89.20 cm/s  Systemic Diam: 2.40 cm MV E/A ratio:  0.88 Candee Furbish MD Electronically signed by Candee Furbish MD Signature Date/Time: 12/09/2020/12:15:49 PM    Final    VAS US CAROTID  Result Date: 12/10/2020 Carotid Arterial Duplex Study Patient Name:  OLEAN SANGSTER Maestas  Date of Exam:   12/09/2020 Medical Rec #: [id number]     Accession #:    [id number] Date of Birth: [birth year]     Patient Gender: F Patient Age:   62 years Exam Location:  [facility] Procedure:      VAS US CAROTID Referring Phys: Babs Bertin --------------------------------------------------------------------------------  Indications:       TIA. Risk Factors:      Hypertension, hyperlipidemia, Diabetes. Comparison Study:  No prior study Performing Technologist: Maudry Mayhew MHA, RDMS, RVT, RDCS  Examination Guidelines: A complete evaluation includes B-mode imaging, spectral Doppler, color Doppler, and power Doppler as needed of all accessible portions of each vessel. Bilateral testing is considered an integral part of a complete examination. Limited examinations for reoccurring indications may be performed as noted.  Right Carotid Findings: +----------+--------+--------+--------+-----------------------+--------+           PSV  cm/sEDV cm/sStenosisPlaque Description     Comments +----------+--------+--------+--------+-----------------------+--------+ CCA Prox  80      14                                              +----------+--------+--------+--------+-----------------------+--------+ CCA Distal76      13                                              +----------+--------+--------+--------+-----------------------+--------+ ICA Prox  60      15                                              +----------+--------+--------+--------+-----------------------+--------+ ICA Distal76  18                                              +----------+--------+--------+--------+-----------------------+--------+ ECA       82      10              smooth and heterogenous         +----------+--------+--------+--------+-----------------------+--------+ +----------+--------+-------+----------------+-------------------+           PSV cm/sEDV cmsDescribe        Arm Pressure (mmHG) +----------+--------+-------+----------------+-------------------+ Subclavian132            Multiphasic, WNL                    +----------+--------+-------+----------------+-------------------+ +---------+--------+--+--------+-+---------+ VertebralPSV cm/s43EDV cm/s9Antegrade +---------+--------+--+--------+-+---------+  Left Carotid Findings: +----------+--------+-------+--------+--------------------------------+--------+           PSV cm/sEDV    StenosisPlaque Description              Comments                   cm/s                                                    +----------+--------+-------+--------+--------------------------------+--------+ CCA Prox  67      11                                                      +----------+--------+-------+--------+--------------------------------+--------+ CCA Distal57      12             irregular, focal and                                                       heterogenous                             +----------+--------+-------+--------+--------------------------------+--------+ ICA Prox  47      11                                                      +----------+--------+-------+--------+--------------------------------+--------+ ICA Distal73      22                                                      +----------+--------+-------+--------+--------------------------------+--------+ ECA       97      10                                                      +----------+--------+-------+--------+--------------------------------+--------+ +----------+--------+--------+----------------+-------------------+  PSV cm/sEDV cm/sDescribe        Arm Pressure (mmHG) +----------+--------+--------+----------------+-------------------+ [id number]             Multiphasic, WNL                    +----------+--------+--------+----------------+-------------------+ +---------+--------+--+--------+--+---------+ VertebralPSV cm/s73EDV cm/s16Antegrade +---------+--------+--+--------+--+---------+   Summary: Right Carotid: Velocities in the right ICA are consistent with a 1-39% stenosis. Left Carotid: Velocities in the left ICA are consistent with a 1-39% stenosis. Vertebrals:  Bilateral vertebral arteries demonstrate antegrade flow. Subclavians: Normal flow hemodynamics were seen in bilateral subclavian              arteries. *See table(s) above for measurements and observations.  Electronically signed by Orlie Pollen on 12/10/2020 at 6:50:52 AM.    Final     Microbiology: Recent Results (from the past 240 hour(s))  Resp Panel by RT-PCR (Flu A&B, Covid) Nasopharyngeal Swab     Status: None   Collection Time: 12/25/20  7:30 PM   Specimen: Nasopharyngeal Swab; Nasopharyngeal(NP) swabs in vial transport medium  Result Value Ref Range Status   SARS Coronavirus 2 by RT PCR NEGATIVE NEGATIVE Final    Comment: (NOTE) SARS-CoV-2  target nucleic acids are NOT DETECTED.  The SARS-CoV-2 RNA is generally detectable in upper respiratory specimens during the acute phase of infection. The lowest concentration of SARS-CoV-2 viral copies this assay can detect is 138 copies/mL. A negative result does not preclude SARS-Cov-2 infection and should not be used as the sole basis for treatment or other patient management decisions. A negative result may occur with  improper specimen collection/handling, submission of specimen other than nasopharyngeal swab, presence of viral mutation(s) within the areas targeted by this assay, and inadequate number of viral copies(<138 copies/mL). A negative result must be combined with clinical observations, patient history, and epidemiological information. The expected result is Negative.  Fact Sheet for Patients:  EntrepreneurPulse.com.au  Fact Sheet for Healthcare Providers:  IncredibleEmployment.be  This test is no t yet approved or cleared by the [place] FDA and  has been authorized for detection and/or diagnosis of SARS-CoV-2 by FDA under an Emergency Use Authorization (EUA). This EUA will remain  in effect (meaning this test can be used) for the duration of the COVID-19 declaration under Section 564(b)(1) of the Act, 21 U.S.C.section 360bbb-3(b)(1), unless the authorization is terminated  or revoked sooner.       Influenza A by PCR NEGATIVE NEGATIVE Final   Influenza B by PCR NEGATIVE NEGATIVE Final    Comment: (NOTE) The Xpert Xpress SARS-CoV-2/FLU/RSV plus assay is intended as an aid in the diagnosis of influenza from Nasopharyngeal swab specimens and should not be used as a sole basis for treatment. Nasal washings and aspirates are unacceptable for Xpert Xpress SARS-CoV-2/FLU/RSV testing.  Fact Sheet for Patients: EntrepreneurPulse.com.au  Fact Sheet for Healthcare  Providers: IncredibleEmployment.be  This test is not yet approved or cleared by the [place] FDA and has been authorized for detection and/or diagnosis of SARS-CoV-2 by FDA under an Emergency Use Authorization (EUA). This EUA will remain in effect (meaning this test can be used) for the duration of the COVID-19 declaration under Section 564(b)(1) of the Act, 21 U.S.C. section 360bbb-3(b)(1), unless the authorization is terminated or revoked.  Performed at [facility], [address], [place], [place] [address]      Labs: Basic Metabolic Panel: Recent Labs  Lab 12/21/20 1715 12/25/20 1202 12/26/20 0533 12/27/20 0513  NA 138 141 144 140  K 3.4* 3.4* 3.6 3.6  CL 102 106 109 106  CO2 '28 25 25 26  ' GLUCOSE 69* 48* 119* 154*  BUN 49* 55* 41* 34*  CREATININE 2.22* 1.97* 1.74* 1.84*  CALCIUM 8.6* 8.9 8.6* 8.4*  MG  --   --   --  2.1   Liver Function Tests: Recent Labs  Lab 12/26/20 0533  AST 12*  ALT 13  ALKPHOS 100  BILITOT 0.3  PROT 6.5  ALBUMIN 3.4*   No results for input(s): LIPASE, AMYLASE in the last 168 hours. No results for input(s): AMMONIA in the last 168 hours. CBC: Recent Labs  Lab 12/21/20 1715 12/25/20 1202  WBC 7.4 8.0  NEUTROABS 4.8  --   HGB 11.1* 11.6*  HCT 34.5* 36.6  MCV 82.9 84.7  PLT 267 220   Cardiac Enzymes: No results for input(s): CKTOTAL, CKMB, CKMBINDEX, TROPONINI in the last 168 hours. BNP: BNP (last 3 results) No results for input(s): BNP in the last 8760 hours.  ProBNP (last 3 results) No results for input(s): PROBNP in the last 8760 hours.  CBG: Recent Labs  Lab 12/26/20 1205 12/26/20 1627 12/26/20 2136 12/27/20 0705 12/27/20 1109  GLUCAP 105* 183* 119* 147* 197*       Signed:  Florencia Reasons MD, PhD, FACP  [facility] 12/27/2020, 3:48 PM

## 2020-12-27 NOTE — Progress Notes (Signed)
Patient discharged home with discharge instructions, went over with patient and husband at bedside. Medication returned to patient from pharmacy and placed in patient belonging bag. Removed IV from Right wrist. Transported via w/c to husbands' personal vehicle

## 2020-12-30 DIAGNOSIS — E1165 Type 2 diabetes mellitus with hyperglycemia: Secondary | ICD-10-CM | POA: Diagnosis not present

## 2021-01-02 DIAGNOSIS — Z7982 Long term (current) use of aspirin: Secondary | ICD-10-CM | POA: Diagnosis not present

## 2021-01-02 DIAGNOSIS — I130 Hypertensive heart and chronic kidney disease with heart failure and stage 1 through stage 4 chronic kidney disease, or unspecified chronic kidney disease: Secondary | ICD-10-CM | POA: Diagnosis not present

## 2021-01-02 DIAGNOSIS — R413 Other amnesia: Secondary | ICD-10-CM | POA: Diagnosis not present

## 2021-01-02 DIAGNOSIS — I5032 Chronic diastolic (congestive) heart failure: Secondary | ICD-10-CM | POA: Diagnosis not present

## 2021-01-02 DIAGNOSIS — I10 Essential (primary) hypertension: Secondary | ICD-10-CM | POA: Diagnosis not present

## 2021-01-02 DIAGNOSIS — E039 Hypothyroidism, unspecified: Secondary | ICD-10-CM | POA: Diagnosis not present

## 2021-01-02 DIAGNOSIS — E1122 Type 2 diabetes mellitus with diabetic chronic kidney disease: Secondary | ICD-10-CM | POA: Diagnosis not present

## 2021-01-02 DIAGNOSIS — Z8673 Personal history of transient ischemic attack (TIA), and cerebral infarction without residual deficits: Secondary | ICD-10-CM | POA: Diagnosis not present

## 2021-01-02 DIAGNOSIS — E785 Hyperlipidemia, unspecified: Secondary | ICD-10-CM | POA: Diagnosis not present

## 2021-01-02 DIAGNOSIS — Z794 Long term (current) use of insulin: Secondary | ICD-10-CM | POA: Diagnosis not present

## 2021-01-02 DIAGNOSIS — G459 Transient cerebral ischemic attack, unspecified: Secondary | ICD-10-CM | POA: Diagnosis not present

## 2021-01-02 DIAGNOSIS — Z7902 Long term (current) use of antithrombotics/antiplatelets: Secondary | ICD-10-CM | POA: Diagnosis not present

## 2021-01-02 DIAGNOSIS — E1129 Type 2 diabetes mellitus with other diabetic kidney complication: Secondary | ICD-10-CM | POA: Diagnosis not present

## 2021-01-02 DIAGNOSIS — N184 Chronic kidney disease, stage 4 (severe): Secondary | ICD-10-CM | POA: Diagnosis not present

## 2021-01-02 DIAGNOSIS — D869 Sarcoidosis, unspecified: Secondary | ICD-10-CM | POA: Diagnosis not present

## 2021-01-02 DIAGNOSIS — M47812 Spondylosis without myelopathy or radiculopathy, cervical region: Secondary | ICD-10-CM | POA: Diagnosis not present

## 2021-01-07 DIAGNOSIS — R9431 Abnormal electrocardiogram [ECG] [EKG]: Secondary | ICD-10-CM | POA: Diagnosis not present

## 2021-01-07 DIAGNOSIS — R200 Anesthesia of skin: Secondary | ICD-10-CM | POA: Diagnosis not present

## 2021-01-07 DIAGNOSIS — R531 Weakness: Secondary | ICD-10-CM | POA: Diagnosis not present

## 2021-01-07 DIAGNOSIS — Z8673 Personal history of transient ischemic attack (TIA), and cerebral infarction without residual deficits: Secondary | ICD-10-CM | POA: Diagnosis not present

## 2021-01-07 DIAGNOSIS — E785 Hyperlipidemia, unspecified: Secondary | ICD-10-CM | POA: Diagnosis not present

## 2021-01-07 DIAGNOSIS — H01001 Unspecified blepharitis right upper eyelid: Secondary | ICD-10-CM | POA: Diagnosis not present

## 2021-01-07 DIAGNOSIS — H02834 Dermatochalasis of left upper eyelid: Secondary | ICD-10-CM | POA: Diagnosis not present

## 2021-01-07 DIAGNOSIS — N184 Chronic kidney disease, stage 4 (severe): Secondary | ICD-10-CM | POA: Diagnosis not present

## 2021-01-07 DIAGNOSIS — E113513 Type 2 diabetes mellitus with proliferative diabetic retinopathy with macular edema, bilateral: Secondary | ICD-10-CM | POA: Diagnosis not present

## 2021-01-07 DIAGNOSIS — Z880 Allergy status to penicillin: Secondary | ICD-10-CM | POA: Diagnosis not present

## 2021-01-07 DIAGNOSIS — E1122 Type 2 diabetes mellitus with diabetic chronic kidney disease: Secondary | ICD-10-CM | POA: Diagnosis not present

## 2021-01-07 DIAGNOSIS — H02831 Dermatochalasis of right upper eyelid: Secondary | ICD-10-CM | POA: Diagnosis not present

## 2021-01-07 DIAGNOSIS — R519 Headache, unspecified: Secondary | ICD-10-CM | POA: Diagnosis not present

## 2021-01-07 DIAGNOSIS — I129 Hypertensive chronic kidney disease with stage 1 through stage 4 chronic kidney disease, or unspecified chronic kidney disease: Secondary | ICD-10-CM | POA: Diagnosis not present

## 2021-01-07 DIAGNOSIS — Z9049 Acquired absence of other specified parts of digestive tract: Secondary | ICD-10-CM | POA: Diagnosis not present

## 2021-01-09 DIAGNOSIS — R200 Anesthesia of skin: Secondary | ICD-10-CM | POA: Diagnosis not present

## 2021-01-11 DIAGNOSIS — E785 Hyperlipidemia, unspecified: Secondary | ICD-10-CM | POA: Diagnosis not present

## 2021-01-11 DIAGNOSIS — I130 Hypertensive heart and chronic kidney disease with heart failure and stage 1 through stage 4 chronic kidney disease, or unspecified chronic kidney disease: Secondary | ICD-10-CM | POA: Diagnosis not present

## 2021-01-11 DIAGNOSIS — N184 Chronic kidney disease, stage 4 (severe): Secondary | ICD-10-CM | POA: Diagnosis not present

## 2021-01-11 DIAGNOSIS — Z7982 Long term (current) use of aspirin: Secondary | ICD-10-CM | POA: Diagnosis not present

## 2021-01-11 DIAGNOSIS — R413 Other amnesia: Secondary | ICD-10-CM | POA: Diagnosis not present

## 2021-01-11 DIAGNOSIS — M47812 Spondylosis without myelopathy or radiculopathy, cervical region: Secondary | ICD-10-CM | POA: Diagnosis not present

## 2021-01-11 DIAGNOSIS — Z794 Long term (current) use of insulin: Secondary | ICD-10-CM | POA: Diagnosis not present

## 2021-01-11 DIAGNOSIS — I5032 Chronic diastolic (congestive) heart failure: Secondary | ICD-10-CM | POA: Diagnosis not present

## 2021-01-11 DIAGNOSIS — Z8673 Personal history of transient ischemic attack (TIA), and cerebral infarction without residual deficits: Secondary | ICD-10-CM | POA: Diagnosis not present

## 2021-01-11 DIAGNOSIS — E1122 Type 2 diabetes mellitus with diabetic chronic kidney disease: Secondary | ICD-10-CM | POA: Diagnosis not present

## 2021-01-11 DIAGNOSIS — Z7902 Long term (current) use of antithrombotics/antiplatelets: Secondary | ICD-10-CM | POA: Diagnosis not present

## 2021-01-14 DIAGNOSIS — Z8673 Personal history of transient ischemic attack (TIA), and cerebral infarction without residual deficits: Secondary | ICD-10-CM | POA: Diagnosis not present

## 2021-01-14 DIAGNOSIS — Z7982 Long term (current) use of aspirin: Secondary | ICD-10-CM | POA: Diagnosis not present

## 2021-01-14 DIAGNOSIS — I130 Hypertensive heart and chronic kidney disease with heart failure and stage 1 through stage 4 chronic kidney disease, or unspecified chronic kidney disease: Secondary | ICD-10-CM | POA: Diagnosis not present

## 2021-01-14 DIAGNOSIS — E1122 Type 2 diabetes mellitus with diabetic chronic kidney disease: Secondary | ICD-10-CM | POA: Diagnosis not present

## 2021-01-14 DIAGNOSIS — R413 Other amnesia: Secondary | ICD-10-CM | POA: Diagnosis not present

## 2021-01-14 DIAGNOSIS — M47812 Spondylosis without myelopathy or radiculopathy, cervical region: Secondary | ICD-10-CM | POA: Diagnosis not present

## 2021-01-14 DIAGNOSIS — Z794 Long term (current) use of insulin: Secondary | ICD-10-CM | POA: Diagnosis not present

## 2021-01-14 DIAGNOSIS — Z7902 Long term (current) use of antithrombotics/antiplatelets: Secondary | ICD-10-CM | POA: Diagnosis not present

## 2021-01-14 DIAGNOSIS — E785 Hyperlipidemia, unspecified: Secondary | ICD-10-CM | POA: Diagnosis not present

## 2021-01-14 DIAGNOSIS — N184 Chronic kidney disease, stage 4 (severe): Secondary | ICD-10-CM | POA: Diagnosis not present

## 2021-01-14 DIAGNOSIS — I5032 Chronic diastolic (congestive) heart failure: Secondary | ICD-10-CM | POA: Diagnosis not present

## 2021-01-16 DIAGNOSIS — D869 Sarcoidosis, unspecified: Secondary | ICD-10-CM | POA: Diagnosis not present

## 2021-01-16 DIAGNOSIS — E1129 Type 2 diabetes mellitus with other diabetic kidney complication: Secondary | ICD-10-CM | POA: Diagnosis not present

## 2021-01-16 DIAGNOSIS — E039 Hypothyroidism, unspecified: Secondary | ICD-10-CM | POA: Diagnosis not present

## 2021-01-16 DIAGNOSIS — G459 Transient cerebral ischemic attack, unspecified: Secondary | ICD-10-CM | POA: Diagnosis not present

## 2021-01-21 DIAGNOSIS — M47812 Spondylosis without myelopathy or radiculopathy, cervical region: Secondary | ICD-10-CM | POA: Diagnosis not present

## 2021-01-21 DIAGNOSIS — Z7982 Long term (current) use of aspirin: Secondary | ICD-10-CM | POA: Diagnosis not present

## 2021-01-21 DIAGNOSIS — N184 Chronic kidney disease, stage 4 (severe): Secondary | ICD-10-CM | POA: Diagnosis not present

## 2021-01-21 DIAGNOSIS — Z7902 Long term (current) use of antithrombotics/antiplatelets: Secondary | ICD-10-CM | POA: Diagnosis not present

## 2021-01-21 DIAGNOSIS — I5032 Chronic diastolic (congestive) heart failure: Secondary | ICD-10-CM | POA: Diagnosis not present

## 2021-01-21 DIAGNOSIS — E785 Hyperlipidemia, unspecified: Secondary | ICD-10-CM | POA: Diagnosis not present

## 2021-01-21 DIAGNOSIS — R413 Other amnesia: Secondary | ICD-10-CM | POA: Diagnosis not present

## 2021-01-21 DIAGNOSIS — E1122 Type 2 diabetes mellitus with diabetic chronic kidney disease: Secondary | ICD-10-CM | POA: Diagnosis not present

## 2021-01-21 DIAGNOSIS — Z794 Long term (current) use of insulin: Secondary | ICD-10-CM | POA: Diagnosis not present

## 2021-01-21 DIAGNOSIS — Z8673 Personal history of transient ischemic attack (TIA), and cerebral infarction without residual deficits: Secondary | ICD-10-CM | POA: Diagnosis not present

## 2021-01-21 DIAGNOSIS — I130 Hypertensive heart and chronic kidney disease with heart failure and stage 1 through stage 4 chronic kidney disease, or unspecified chronic kidney disease: Secondary | ICD-10-CM | POA: Diagnosis not present

## 2021-01-23 DIAGNOSIS — I5032 Chronic diastolic (congestive) heart failure: Secondary | ICD-10-CM | POA: Diagnosis not present

## 2021-01-23 DIAGNOSIS — N184 Chronic kidney disease, stage 4 (severe): Secondary | ICD-10-CM | POA: Diagnosis not present

## 2021-01-23 DIAGNOSIS — I130 Hypertensive heart and chronic kidney disease with heart failure and stage 1 through stage 4 chronic kidney disease, or unspecified chronic kidney disease: Secondary | ICD-10-CM | POA: Diagnosis not present

## 2021-01-23 DIAGNOSIS — R413 Other amnesia: Secondary | ICD-10-CM | POA: Diagnosis not present

## 2021-01-23 DIAGNOSIS — Z8673 Personal history of transient ischemic attack (TIA), and cerebral infarction without residual deficits: Secondary | ICD-10-CM | POA: Diagnosis not present

## 2021-01-23 DIAGNOSIS — E785 Hyperlipidemia, unspecified: Secondary | ICD-10-CM | POA: Diagnosis not present

## 2021-01-23 DIAGNOSIS — M47812 Spondylosis without myelopathy or radiculopathy, cervical region: Secondary | ICD-10-CM | POA: Diagnosis not present

## 2021-01-23 DIAGNOSIS — E1122 Type 2 diabetes mellitus with diabetic chronic kidney disease: Secondary | ICD-10-CM | POA: Diagnosis not present

## 2021-01-23 DIAGNOSIS — Z7902 Long term (current) use of antithrombotics/antiplatelets: Secondary | ICD-10-CM | POA: Diagnosis not present

## 2021-01-23 DIAGNOSIS — Z7982 Long term (current) use of aspirin: Secondary | ICD-10-CM | POA: Diagnosis not present

## 2021-01-23 DIAGNOSIS — Z794 Long term (current) use of insulin: Secondary | ICD-10-CM | POA: Diagnosis not present

## 2021-01-28 DIAGNOSIS — R413 Other amnesia: Secondary | ICD-10-CM | POA: Diagnosis not present

## 2021-01-28 DIAGNOSIS — E785 Hyperlipidemia, unspecified: Secondary | ICD-10-CM | POA: Diagnosis not present

## 2021-01-28 DIAGNOSIS — Z7902 Long term (current) use of antithrombotics/antiplatelets: Secondary | ICD-10-CM | POA: Diagnosis not present

## 2021-01-28 DIAGNOSIS — I5032 Chronic diastolic (congestive) heart failure: Secondary | ICD-10-CM | POA: Diagnosis not present

## 2021-01-28 DIAGNOSIS — Z8673 Personal history of transient ischemic attack (TIA), and cerebral infarction without residual deficits: Secondary | ICD-10-CM | POA: Diagnosis not present

## 2021-01-28 DIAGNOSIS — Z7982 Long term (current) use of aspirin: Secondary | ICD-10-CM | POA: Diagnosis not present

## 2021-01-28 DIAGNOSIS — Z794 Long term (current) use of insulin: Secondary | ICD-10-CM | POA: Diagnosis not present

## 2021-01-28 DIAGNOSIS — N184 Chronic kidney disease, stage 4 (severe): Secondary | ICD-10-CM | POA: Diagnosis not present

## 2021-01-28 DIAGNOSIS — I130 Hypertensive heart and chronic kidney disease with heart failure and stage 1 through stage 4 chronic kidney disease, or unspecified chronic kidney disease: Secondary | ICD-10-CM | POA: Diagnosis not present

## 2021-01-28 DIAGNOSIS — M47812 Spondylosis without myelopathy or radiculopathy, cervical region: Secondary | ICD-10-CM | POA: Diagnosis not present

## 2021-01-28 DIAGNOSIS — E1122 Type 2 diabetes mellitus with diabetic chronic kidney disease: Secondary | ICD-10-CM | POA: Diagnosis not present

## 2021-01-30 DIAGNOSIS — E1165 Type 2 diabetes mellitus with hyperglycemia: Secondary | ICD-10-CM | POA: Diagnosis not present

## 2021-01-31 DIAGNOSIS — Z8673 Personal history of transient ischemic attack (TIA), and cerebral infarction without residual deficits: Secondary | ICD-10-CM | POA: Diagnosis not present

## 2021-01-31 DIAGNOSIS — R413 Other amnesia: Secondary | ICD-10-CM | POA: Diagnosis not present

## 2021-01-31 DIAGNOSIS — E785 Hyperlipidemia, unspecified: Secondary | ICD-10-CM | POA: Diagnosis not present

## 2021-01-31 DIAGNOSIS — M47812 Spondylosis without myelopathy or radiculopathy, cervical region: Secondary | ICD-10-CM | POA: Diagnosis not present

## 2021-01-31 DIAGNOSIS — Z7982 Long term (current) use of aspirin: Secondary | ICD-10-CM | POA: Diagnosis not present

## 2021-01-31 DIAGNOSIS — Z794 Long term (current) use of insulin: Secondary | ICD-10-CM | POA: Diagnosis not present

## 2021-01-31 DIAGNOSIS — E1122 Type 2 diabetes mellitus with diabetic chronic kidney disease: Secondary | ICD-10-CM | POA: Diagnosis not present

## 2021-01-31 DIAGNOSIS — Z7902 Long term (current) use of antithrombotics/antiplatelets: Secondary | ICD-10-CM | POA: Diagnosis not present

## 2021-01-31 DIAGNOSIS — I130 Hypertensive heart and chronic kidney disease with heart failure and stage 1 through stage 4 chronic kidney disease, or unspecified chronic kidney disease: Secondary | ICD-10-CM | POA: Diagnosis not present

## 2021-01-31 DIAGNOSIS — N184 Chronic kidney disease, stage 4 (severe): Secondary | ICD-10-CM | POA: Diagnosis not present

## 2021-01-31 DIAGNOSIS — I5032 Chronic diastolic (congestive) heart failure: Secondary | ICD-10-CM | POA: Diagnosis not present

## 2021-02-01 DIAGNOSIS — Z8673 Personal history of transient ischemic attack (TIA), and cerebral infarction without residual deficits: Secondary | ICD-10-CM | POA: Diagnosis not present

## 2021-02-01 DIAGNOSIS — Z7982 Long term (current) use of aspirin: Secondary | ICD-10-CM | POA: Diagnosis not present

## 2021-02-01 DIAGNOSIS — R413 Other amnesia: Secondary | ICD-10-CM | POA: Diagnosis not present

## 2021-02-01 DIAGNOSIS — Z7902 Long term (current) use of antithrombotics/antiplatelets: Secondary | ICD-10-CM | POA: Diagnosis not present

## 2021-02-01 DIAGNOSIS — E785 Hyperlipidemia, unspecified: Secondary | ICD-10-CM | POA: Diagnosis not present

## 2021-02-01 DIAGNOSIS — I5032 Chronic diastolic (congestive) heart failure: Secondary | ICD-10-CM | POA: Diagnosis not present

## 2021-02-01 DIAGNOSIS — I130 Hypertensive heart and chronic kidney disease with heart failure and stage 1 through stage 4 chronic kidney disease, or unspecified chronic kidney disease: Secondary | ICD-10-CM | POA: Diagnosis not present

## 2021-02-01 DIAGNOSIS — Z794 Long term (current) use of insulin: Secondary | ICD-10-CM | POA: Diagnosis not present

## 2021-02-01 DIAGNOSIS — E1122 Type 2 diabetes mellitus with diabetic chronic kidney disease: Secondary | ICD-10-CM | POA: Diagnosis not present

## 2021-02-01 DIAGNOSIS — M47812 Spondylosis without myelopathy or radiculopathy, cervical region: Secondary | ICD-10-CM | POA: Diagnosis not present

## 2021-02-01 DIAGNOSIS — N184 Chronic kidney disease, stage 4 (severe): Secondary | ICD-10-CM | POA: Diagnosis not present

## 2021-02-04 DIAGNOSIS — N184 Chronic kidney disease, stage 4 (severe): Secondary | ICD-10-CM | POA: Diagnosis not present

## 2021-02-04 DIAGNOSIS — M47812 Spondylosis without myelopathy or radiculopathy, cervical region: Secondary | ICD-10-CM | POA: Diagnosis not present

## 2021-02-04 DIAGNOSIS — I130 Hypertensive heart and chronic kidney disease with heart failure and stage 1 through stage 4 chronic kidney disease, or unspecified chronic kidney disease: Secondary | ICD-10-CM | POA: Diagnosis not present

## 2021-02-04 DIAGNOSIS — E1122 Type 2 diabetes mellitus with diabetic chronic kidney disease: Secondary | ICD-10-CM | POA: Diagnosis not present

## 2021-02-04 DIAGNOSIS — Z7902 Long term (current) use of antithrombotics/antiplatelets: Secondary | ICD-10-CM | POA: Diagnosis not present

## 2021-02-04 DIAGNOSIS — Z8673 Personal history of transient ischemic attack (TIA), and cerebral infarction without residual deficits: Secondary | ICD-10-CM | POA: Diagnosis not present

## 2021-02-04 DIAGNOSIS — E785 Hyperlipidemia, unspecified: Secondary | ICD-10-CM | POA: Diagnosis not present

## 2021-02-04 DIAGNOSIS — I5032 Chronic diastolic (congestive) heart failure: Secondary | ICD-10-CM | POA: Diagnosis not present

## 2021-02-04 DIAGNOSIS — Z7982 Long term (current) use of aspirin: Secondary | ICD-10-CM | POA: Diagnosis not present

## 2021-02-04 DIAGNOSIS — R413 Other amnesia: Secondary | ICD-10-CM | POA: Diagnosis not present

## 2021-02-04 DIAGNOSIS — Z794 Long term (current) use of insulin: Secondary | ICD-10-CM | POA: Diagnosis not present

## 2021-02-07 DIAGNOSIS — I5032 Chronic diastolic (congestive) heart failure: Secondary | ICD-10-CM | POA: Diagnosis not present

## 2021-02-07 DIAGNOSIS — Z7902 Long term (current) use of antithrombotics/antiplatelets: Secondary | ICD-10-CM | POA: Diagnosis not present

## 2021-02-07 DIAGNOSIS — N184 Chronic kidney disease, stage 4 (severe): Secondary | ICD-10-CM | POA: Diagnosis not present

## 2021-02-07 DIAGNOSIS — R413 Other amnesia: Secondary | ICD-10-CM | POA: Diagnosis not present

## 2021-02-07 DIAGNOSIS — Z794 Long term (current) use of insulin: Secondary | ICD-10-CM | POA: Diagnosis not present

## 2021-02-07 DIAGNOSIS — Z8673 Personal history of transient ischemic attack (TIA), and cerebral infarction without residual deficits: Secondary | ICD-10-CM | POA: Diagnosis not present

## 2021-02-07 DIAGNOSIS — Z7982 Long term (current) use of aspirin: Secondary | ICD-10-CM | POA: Diagnosis not present

## 2021-02-07 DIAGNOSIS — E785 Hyperlipidemia, unspecified: Secondary | ICD-10-CM | POA: Diagnosis not present

## 2021-02-07 DIAGNOSIS — M47812 Spondylosis without myelopathy or radiculopathy, cervical region: Secondary | ICD-10-CM | POA: Diagnosis not present

## 2021-02-07 DIAGNOSIS — E1122 Type 2 diabetes mellitus with diabetic chronic kidney disease: Secondary | ICD-10-CM | POA: Diagnosis not present

## 2021-02-07 DIAGNOSIS — I130 Hypertensive heart and chronic kidney disease with heart failure and stage 1 through stage 4 chronic kidney disease, or unspecified chronic kidney disease: Secondary | ICD-10-CM | POA: Diagnosis not present

## 2021-02-08 ENCOUNTER — Emergency Department (HOSPITAL_COMMUNITY)
Admission: EM | Admit: 2021-02-08 | Discharge: 2021-02-08 | Disposition: A | Discharge status: Home / Self Care | Payer: Medicare Other | Attending: Emergency Medicine | Admitting: Emergency Medicine

## 2021-02-08 ENCOUNTER — Encounter (HOSPITAL_COMMUNITY): Payer: Self-pay | Admitting: *Deleted

## 2021-02-08 DIAGNOSIS — U071 COVID-19: Secondary | ICD-10-CM | POA: Insufficient documentation

## 2021-02-08 DIAGNOSIS — I5032 Chronic diastolic (congestive) heart failure: Secondary | ICD-10-CM | POA: Diagnosis not present

## 2021-02-08 DIAGNOSIS — Z7902 Long term (current) use of antithrombotics/antiplatelets: Secondary | ICD-10-CM | POA: Insufficient documentation

## 2021-02-08 DIAGNOSIS — Z79899 Other long term (current) drug therapy: Secondary | ICD-10-CM | POA: Diagnosis not present

## 2021-02-08 DIAGNOSIS — Z8673 Personal history of transient ischemic attack (TIA), and cerebral infarction without residual deficits: Secondary | ICD-10-CM | POA: Diagnosis not present

## 2021-02-08 DIAGNOSIS — E1169 Type 2 diabetes mellitus with other specified complication: Secondary | ICD-10-CM | POA: Diagnosis not present

## 2021-02-08 DIAGNOSIS — E039 Hypothyroidism, unspecified: Secondary | ICD-10-CM | POA: Diagnosis not present

## 2021-02-08 DIAGNOSIS — R059 Cough, unspecified: Secondary | ICD-10-CM | POA: Diagnosis present

## 2021-02-08 DIAGNOSIS — Z794 Long term (current) use of insulin: Secondary | ICD-10-CM | POA: Insufficient documentation

## 2021-02-08 DIAGNOSIS — I110 Hypertensive heart disease with heart failure: Secondary | ICD-10-CM | POA: Insufficient documentation

## 2021-02-08 LAB — RESP PANEL BY RT-PCR (FLU A&B, COVID) ARPGX2
Influenza A by PCR: NEGATIVE
Influenza B by PCR: NEGATIVE
SARS Coronavirus 2 by RT PCR: POSITIVE — AB

## 2021-02-08 NOTE — ED Triage Notes (Signed)
Requests covid testing

## 2021-02-08 NOTE — Discharge Instructions (Signed)
Recommendations for at home COVID-19 symptoms management:  Please continue isolation at home. Call [phone] to see whether you might be eligible for therapeutic antibody infusions (leave your name and they will call you back).  If have acute worsening of symptoms please go to ER/urgent care for further evaluation. Check pulse oximetry and if below 90-92% please go to ER. The following supplements MAY help:  Vitamin C 500mg  twice a day and Quercetin 250-500 mg twice a day Vitamin D3 2000 - 4000 u/day B Complex vitamins Zinc 75-100 mg/day Melatonin 6-10 mg at night (the optimal dose is unknown) Aspirin 81mg /day (if no history of bleeding issues)

## 2021-02-08 NOTE — ED Provider Notes (Signed)
[facility] EMERGENCY DEPARTMENT Provider Note   CSN: [id number] Arrival date & time: 02/08/21  1552     History Chief Complaint  Patient presents with   Covid Exposure    Holly Baldwin is a 75 y.o. female.  The history is provided by the patient and medical records. No language interpreter was used.   75 year old female significant history of diabetes, hypertension, anxiety, presenting with cold symptoms.  For the past 4 days patient has had chest congestion, cough, body aches, decrease in appetite, and some chills.  Her husband is now having similar symptoms as well.  She has been fully vaccinated for COVID-19.  She denies any specific treatment tried at home.  She would like to be tested for COVID-19.  Past Medical History:  Diagnosis Date   Anxiety    Chronic abdominal pain    Diabetes mellitus    GERD (gastroesophageal reflux disease)    Hiatal hernia    Hypercholesteremia    Hypertension     Patient Active Problem List   Diagnosis Date Noted   TIA (transient ischemic attack) 12/08/2020   Hypercholesteremia    Hypertension    Chronic diastolic CHF (congestive heart failure) (HCC)    Acute on chronic diastolic CHF (congestive heart failure) ([place]) 08/21/2018   Abnormal liver function    Hypertensive emergency    Acute CHF (congestive heart failure) ([place]) 08/20/2018   Hypokalemia 08/20/2018   Anemia 08/20/2018   Acute renal failure superimposed on stage 3 chronic kidney disease ([place]) 08/20/2018   Acute CHF ([place]) 08/20/2018   Acute respiratory failure with hypoxia ([place])    Vitamin D deficiency 03/17/2017   Class 2 severe obesity due to excess calories with serious comorbidity and body mass index (BMI) of 38.0 to 38.9 in adult ([place]) 07/10/2015   Essential hypertension, benign 01/02/2015   Mixed hyperlipidemia 01/02/2015   Unspecified gastritis and gastroduodenitis without mention of hemorrhage 05/06/2012   Abdominal pain 05/05/2012   Nausea & vomiting 05/05/2012    Depression 05/05/2012   GERD (gastroesophageal reflux disease) 05/05/2012   DM2 (diabetes mellitus, type 2) ([place]) 05/05/2012   Primary hypothyroidism 05/05/2012   Hiatal hernia 05/05/2012   Malignant hypertension 05/05/2012   SHOULDER PAIN 03/25/2007   IMPINGEMENT SYNDROME 03/25/2007   RUPTURE ROTATOR CUFF 03/25/2007   HIGH BLOOD PRESSURE 03/25/2007    Past Surgical History:  Procedure Laterality Date   ABDOMINAL HYSTERECTOMY     CATARACT EXTRACTION W/PHACO Right 01/09/2020   Procedure: CATARACT EXTRACTION PHACO AND INTRAOCULAR LENS PLACEMENT ([place]);  Surgeon: Baruch Goldmann, MD;  Location: [facility];  Service: Ophthalmology;  Laterality: Right;  CDE: 12.01   CATARACT EXTRACTION W/PHACO Left 01/23/2020   Procedure: CATARACT EXTRACTION PHACO AND INTRAOCULAR LENS PLACEMENT LEFT EYE;  Surgeon: Baruch Goldmann, MD;  Location: [facility];  Service: Ophthalmology;  Laterality: Left;  CDE 8.71   COLONOSCOPY N/A 06/23/2012   Procedure: COLONOSCOPY;  Surgeon: Rogene Houston, MD;  Location: [facility];  Service: Endoscopy;  Laterality: N/A;  100-moved to 1200 Ann to notify pt   ESOPHAGOGASTRODUODENOSCOPY N/A 05/06/2012   Procedure: ESOPHAGOGASTRODUODENOSCOPY (EGD);  Surgeon: Rogene Houston, MD;  Location: [facility];  Service: Endoscopy;  Laterality: N/A;   MASS EXCISION Right 05/26/2012   Procedure: EXCISION NEOPLASM RIGHT THIGH;  Surgeon: Jamesetta So, MD;  Location: [facility];  Service: General;  Laterality: Right;     OB History   No obstetric history on file.     Family History  Problem  Relation Age of Onset   Diabetes Mother    Diabetes Father    Stroke Father    Diabetes Sister    Diabetes Brother    Colon cancer Neg Hx    Colon polyps Neg Hx     Social History   Tobacco Use   Smoking status: Never   Smokeless tobacco: Never  Vaping Use   Vaping Use: Never used  Substance Use Topics   Alcohol use: No   Drug use: No    Home Medications Prior to Admission medications    Medication Sig Start Date End Date Taking? Authorizing Provider  albuterol (PROVENTIL HFA;VENTOLIN HFA) 108 (90 Base) MCG/ACT inhaler Inhale 1-2 puffs into the lungs every 6 (six) hours as needed for wheezing or shortness of breath.     [provider]  Blood Glucose Monitoring Suppl (ACCU-CHEK AVIVA PLUS) w/Device KIT  03/09/20   [provider]  calcitRIOL (ROCALTROL) 0.25 MCG capsule Take 0.25 mcg by mouth every Monday, Wednesday, and Friday. 11/28/20   [provider]  clopidogrel (PLAVIX) 75 MG tablet Take 1 tablet (75 mg total) by mouth daily. 12/10/20 12/10/21  Lavina Hamman, MD  furosemide (LASIX) 40 MG tablet Take 40 mg by mouth 2 (two) times daily. 08/30/20   [provider]  gabapentin (NEURONTIN) 100 MG capsule TAKE 1 CAPSULE TWICE DAILY Patient not taking: No sig reported 12/31/19   Cassandria Anger, MD  glucose blood (ACCU-CHEK AVIVA PLUS) test strip TEST BLOOD GLUCOSE FOUR TIMES DAILY 05/02/20   Cassandria Anger, MD  hydrALAZINE (APRESOLINE) 100 MG tablet Take 1 tablet (100 mg total) by mouth 3 (three) times daily. Patient taking differently: Take 100 mg by mouth with breakfast, with lunch, and with evening meal. 08/25/18   Barton Dubois, MD  insulin glargine (LANTUS SOLOSTAR) 100 UNIT/ML Solostar Pen Inject 20 Units into the skin at bedtime. 12/27/20   Florencia Reasons, MD  insulin lispro (HUMALOG) 100 UNIT/ML KwikPen Inject 10 Units into the skin 3 (three) times daily. With meals 04/04/20   Brita Romp, NP  isosorbide mononitrate (IMDUR) 60 MG 24 hr tablet Take 1 tablet (60 mg total) by mouth daily. 08/26/18   Barton Dubois, MD  levothyroxine (SYNTHROID) 137 MCG tablet TAKE 1 TABLET (137 MCG TOTAL) BY MOUTH DAILY BEFORE BREAKFAST. 12/20/19   Cassandria Anger, MD  lisinopril (ZESTRIL) 30 MG tablet Take 30 mg by mouth daily. 10/12/20   [provider]  NIFEdipine (PROCARDIA XL/NIFEDICAL XL) 60 MG 24 hr tablet Take 60 mg by mouth  daily. 12/19/19   [provider]  RESTASIS 0.05 % ophthalmic emulsion 1 drop 2 (two) times daily. 12/17/20   [provider]  simvastatin (ZOCOR) 40 MG tablet TAKE 1 TABLET EVERY DAY Patient taking differently: Take 40 mg by mouth daily. 10/05/17   Fayrene Helper, MD  ziprasidone (GEODON) 40 MG capsule Take 40 mg by mouth at bedtime. 10/27/20   [provider]    Allergies    Bee venom and Penicillins  Review of Systems   Review of Systems  All other systems reviewed and are negative.  Physical Exam Updated Vital Signs BP (!) 166/57 (BP Location: Right Arm)   Pulse 61   Temp 98.9 F (37.2 C) (Oral)   Resp 16   SpO2 96%   Physical Exam Vitals and nursing note reviewed.  Constitutional:      General: She is not in acute distress.    Appearance: She is  well-developed. She is obese.  HENT:     Head: Atraumatic.  Eyes:     Conjunctiva/sclera: Conjunctivae normal.  Cardiovascular:     Rate and Rhythm: Normal rate and regular rhythm.     Pulses: Normal pulses.     Heart sounds: Normal heart sounds.  Pulmonary:     Effort: Pulmonary effort is normal.     Breath sounds: No wheezing, rhonchi or rales.  Abdominal:     Palpations: Abdomen is soft.     Tenderness: There is no abdominal tenderness.  Musculoskeletal:     Cervical back: Neck supple.  Skin:    Findings: No rash.  Neurological:     Mental Status: She is alert.  Psychiatric:        Mood and Affect: Mood normal.    ED Results / Procedures / Treatments   Labs (all labs ordered are listed, but only abnormal results are displayed) Labs Reviewed  RESP PANEL BY RT-PCR (FLU A&B, COVID) ARPGX2 - Abnormal; Notable for the following components:      Result Value   SARS Coronavirus 2 by RT PCR POSITIVE (*)    All other components within normal limits    EKG None  Radiology No results found.  Procedures Procedures   Medications Ordered in ED Medications - No data to display  ED  Course  I have reviewed the triage vital signs and the nursing notes.  Pertinent labs & imaging results that were available during my care of the patient were reviewed by me and considered in my medical decision making (see chart for details).    MDM Rules/Calculators/A&P                           BP (!) 166/57 (BP Location: Right Arm)   Pulse 61   Temp 98.9 F (37.2 C) (Oral)   Resp 16   SpO2 96%   Final Clinical Impression(s) / ED Diagnoses Final diagnoses:  COVID-19 virus infection    Rx / DC Orders ED Discharge Orders     None      6:00 PM Patient here with cold symptoms for the past 4 days and request to be tested for COVID-19.  She has been fully vaccinated.  She is overall well-appearing, lungs clear on auscultation no wheezing.  COVID test ordered  6:32 PM COVID test is positive.  Unfortunately patient has poor renal function and not a candidate for Paxlovid.  I have provide outpatient recommendation for care management at home with strict return precaution.  CAre discussed with Dr. Roderic Palau.   Holly Baldwin was evaluated in Emergency Department on 02/08/2021 for the symptoms described in the history of present illness. She was evaluated in the context of the global COVID-19 pandemic, which necessitated consideration that the patient might be at risk for infection with the SARS-CoV-2 virus that causes COVID-19. Institutional protocols and algorithms that pertain to the evaluation of patients at risk for COVID-19 are in a state of rapid change based on information released by regulatory bodies including the CDC and federal and state organizations. These policies and algorithms were followed during the patient's care in the ED.    Domenic Moras, PA-C 02/08/21 [id number]    [place] Ferguson, MD 02/09/21 1328

## 2021-02-12 ENCOUNTER — Inpatient Hospital Stay: Payer: Medicare Other | Admitting: Neurology

## 2021-02-18 DIAGNOSIS — Z7982 Long term (current) use of aspirin: Secondary | ICD-10-CM | POA: Diagnosis not present

## 2021-02-18 DIAGNOSIS — Z7902 Long term (current) use of antithrombotics/antiplatelets: Secondary | ICD-10-CM | POA: Diagnosis not present

## 2021-02-18 DIAGNOSIS — E1122 Type 2 diabetes mellitus with diabetic chronic kidney disease: Secondary | ICD-10-CM | POA: Diagnosis not present

## 2021-02-18 DIAGNOSIS — I5032 Chronic diastolic (congestive) heart failure: Secondary | ICD-10-CM | POA: Diagnosis not present

## 2021-02-18 DIAGNOSIS — Z8673 Personal history of transient ischemic attack (TIA), and cerebral infarction without residual deficits: Secondary | ICD-10-CM | POA: Diagnosis not present

## 2021-02-18 DIAGNOSIS — E785 Hyperlipidemia, unspecified: Secondary | ICD-10-CM | POA: Diagnosis not present

## 2021-02-18 DIAGNOSIS — N184 Chronic kidney disease, stage 4 (severe): Secondary | ICD-10-CM | POA: Diagnosis not present

## 2021-02-18 DIAGNOSIS — I130 Hypertensive heart and chronic kidney disease with heart failure and stage 1 through stage 4 chronic kidney disease, or unspecified chronic kidney disease: Secondary | ICD-10-CM | POA: Diagnosis not present

## 2021-02-18 DIAGNOSIS — M47812 Spondylosis without myelopathy or radiculopathy, cervical region: Secondary | ICD-10-CM | POA: Diagnosis not present

## 2021-02-18 DIAGNOSIS — R413 Other amnesia: Secondary | ICD-10-CM | POA: Diagnosis not present

## 2021-02-18 DIAGNOSIS — Z794 Long term (current) use of insulin: Secondary | ICD-10-CM | POA: Diagnosis not present

## 2021-02-25 DIAGNOSIS — M47812 Spondylosis without myelopathy or radiculopathy, cervical region: Secondary | ICD-10-CM | POA: Diagnosis not present

## 2021-02-25 DIAGNOSIS — N184 Chronic kidney disease, stage 4 (severe): Secondary | ICD-10-CM | POA: Diagnosis not present

## 2021-02-25 DIAGNOSIS — Z7982 Long term (current) use of aspirin: Secondary | ICD-10-CM | POA: Diagnosis not present

## 2021-02-25 DIAGNOSIS — I5032 Chronic diastolic (congestive) heart failure: Secondary | ICD-10-CM | POA: Diagnosis not present

## 2021-02-25 DIAGNOSIS — Z7902 Long term (current) use of antithrombotics/antiplatelets: Secondary | ICD-10-CM | POA: Diagnosis not present

## 2021-02-25 DIAGNOSIS — R413 Other amnesia: Secondary | ICD-10-CM | POA: Diagnosis not present

## 2021-02-25 DIAGNOSIS — Z8673 Personal history of transient ischemic attack (TIA), and cerebral infarction without residual deficits: Secondary | ICD-10-CM | POA: Diagnosis not present

## 2021-02-25 DIAGNOSIS — I130 Hypertensive heart and chronic kidney disease with heart failure and stage 1 through stage 4 chronic kidney disease, or unspecified chronic kidney disease: Secondary | ICD-10-CM | POA: Diagnosis not present

## 2021-02-25 DIAGNOSIS — E1122 Type 2 diabetes mellitus with diabetic chronic kidney disease: Secondary | ICD-10-CM | POA: Diagnosis not present

## 2021-02-25 DIAGNOSIS — E785 Hyperlipidemia, unspecified: Secondary | ICD-10-CM | POA: Diagnosis not present

## 2021-02-25 DIAGNOSIS — Z794 Long term (current) use of insulin: Secondary | ICD-10-CM | POA: Diagnosis not present

## 2021-02-27 DIAGNOSIS — E785 Hyperlipidemia, unspecified: Secondary | ICD-10-CM | POA: Diagnosis not present

## 2021-02-27 DIAGNOSIS — N184 Chronic kidney disease, stage 4 (severe): Secondary | ICD-10-CM | POA: Diagnosis not present

## 2021-02-27 DIAGNOSIS — I130 Hypertensive heart and chronic kidney disease with heart failure and stage 1 through stage 4 chronic kidney disease, or unspecified chronic kidney disease: Secondary | ICD-10-CM | POA: Diagnosis not present

## 2021-02-27 DIAGNOSIS — Z7902 Long term (current) use of antithrombotics/antiplatelets: Secondary | ICD-10-CM | POA: Diagnosis not present

## 2021-02-27 DIAGNOSIS — Z794 Long term (current) use of insulin: Secondary | ICD-10-CM | POA: Diagnosis not present

## 2021-02-27 DIAGNOSIS — I5032 Chronic diastolic (congestive) heart failure: Secondary | ICD-10-CM | POA: Diagnosis not present

## 2021-02-27 DIAGNOSIS — R413 Other amnesia: Secondary | ICD-10-CM | POA: Diagnosis not present

## 2021-02-27 DIAGNOSIS — M47812 Spondylosis without myelopathy or radiculopathy, cervical region: Secondary | ICD-10-CM | POA: Diagnosis not present

## 2021-02-27 DIAGNOSIS — Z8673 Personal history of transient ischemic attack (TIA), and cerebral infarction without residual deficits: Secondary | ICD-10-CM | POA: Diagnosis not present

## 2021-02-27 DIAGNOSIS — E1122 Type 2 diabetes mellitus with diabetic chronic kidney disease: Secondary | ICD-10-CM | POA: Diagnosis not present

## 2021-02-27 DIAGNOSIS — Z7982 Long term (current) use of aspirin: Secondary | ICD-10-CM | POA: Diagnosis not present

## 2021-03-04 DIAGNOSIS — E1165 Type 2 diabetes mellitus with hyperglycemia: Secondary | ICD-10-CM | POA: Diagnosis not present

## 2021-03-06 DIAGNOSIS — J069 Acute upper respiratory infection, unspecified: Secondary | ICD-10-CM | POA: Diagnosis not present

## 2021-03-06 DIAGNOSIS — Z23 Encounter for immunization: Secondary | ICD-10-CM | POA: Diagnosis not present

## 2021-03-14 ENCOUNTER — Inpatient Hospital Stay: Payer: Medicare Other | Admitting: Neurology

## 2021-03-18 ENCOUNTER — Other Ambulatory Visit (HOSPITAL_COMMUNITY)
Admission: RE | Admit: 2021-03-18 | Discharge: 2021-03-18 | Disposition: A | Discharge status: Home / Self Care | Payer: Medicare Other | Source: Ambulatory Visit | Attending: Nephrology | Admitting: Nephrology

## 2021-03-18 ENCOUNTER — Other Ambulatory Visit: Payer: Self-pay

## 2021-03-18 DIAGNOSIS — E1129 Type 2 diabetes mellitus with other diabetic kidney complication: Secondary | ICD-10-CM | POA: Diagnosis not present

## 2021-03-18 DIAGNOSIS — N189 Chronic kidney disease, unspecified: Secondary | ICD-10-CM | POA: Insufficient documentation

## 2021-03-18 DIAGNOSIS — R808 Other proteinuria: Secondary | ICD-10-CM | POA: Diagnosis not present

## 2021-03-18 DIAGNOSIS — E1169 Type 2 diabetes mellitus with other specified complication: Secondary | ICD-10-CM | POA: Diagnosis not present

## 2021-03-18 DIAGNOSIS — I5032 Chronic diastolic (congestive) heart failure: Secondary | ICD-10-CM | POA: Diagnosis not present

## 2021-03-18 DIAGNOSIS — E876 Hypokalemia: Secondary | ICD-10-CM | POA: Diagnosis not present

## 2021-03-18 DIAGNOSIS — D631 Anemia in chronic kidney disease: Secondary | ICD-10-CM | POA: Insufficient documentation

## 2021-03-18 DIAGNOSIS — N2581 Secondary hyperparathyroidism of renal origin: Secondary | ICD-10-CM | POA: Diagnosis not present

## 2021-03-18 DIAGNOSIS — E211 Secondary hyperparathyroidism, not elsewhere classified: Secondary | ICD-10-CM | POA: Diagnosis not present

## 2021-03-18 DIAGNOSIS — I129 Hypertensive chronic kidney disease with stage 1 through stage 4 chronic kidney disease, or unspecified chronic kidney disease: Secondary | ICD-10-CM | POA: Diagnosis not present

## 2021-03-18 DIAGNOSIS — E1122 Type 2 diabetes mellitus with diabetic chronic kidney disease: Secondary | ICD-10-CM | POA: Insufficient documentation

## 2021-03-18 DIAGNOSIS — I130 Hypertensive heart and chronic kidney disease with heart failure and stage 1 through stage 4 chronic kidney disease, or unspecified chronic kidney disease: Secondary | ICD-10-CM | POA: Diagnosis not present

## 2021-03-18 DIAGNOSIS — R809 Proteinuria, unspecified: Secondary | ICD-10-CM | POA: Insufficient documentation

## 2021-03-18 LAB — RENAL FUNCTION PANEL
Albumin: 3.9 g/dL (ref 3.5–5.0)
Anion gap: 9 (ref 5–15)
BUN: 42 mg/dL — ABNORMAL HIGH (ref 8–23)
CO2: 29 mmol/L (ref 22–32)
Calcium: 8.7 mg/dL — ABNORMAL LOW (ref 8.9–10.3)
Chloride: 101 mmol/L (ref 98–111)
Creatinine, Ser: 2.06 mg/dL — ABNORMAL HIGH (ref 0.44–1.00)
GFR, Estimated: 25 mL/min — ABNORMAL LOW (ref >60)
Glucose, Bld: 187 mg/dL — ABNORMAL HIGH (ref 70–99)
Phosphorus: 3.8 mg/dL (ref 2.5–4.6)
Potassium: 3.4 mmol/L — ABNORMAL LOW (ref 3.5–5.1)
Sodium: 139 mmol/L (ref 135–145)

## 2021-03-18 LAB — IRON AND TIBC
Iron: 42 ug/dL (ref 28–170)
Saturation Ratios: 13 % (ref 10.4–31.8)
TIBC: 311 ug/dL (ref 250–450)
UIBC: 269 ug/dL

## 2021-03-18 LAB — PROTEIN / CREATININE RATIO, URINE
Creatinine, Urine: 82 mg/dL
Protein Creatinine Ratio: 2.29 mg/mg{Cre} — ABNORMAL HIGH (ref 0.00–0.15)
Total Protein, Urine: 188 mg/dL

## 2021-03-18 LAB — CBC
HCT: 36.7 % (ref 36.0–46.0)
Hemoglobin: 11.5 g/dL — ABNORMAL LOW (ref 12.0–15.0)
MCH: 26.3 pg (ref 26.0–34.0)
MCHC: 31.3 g/dL (ref 30.0–36.0)
MCV: 84 fL (ref 80.0–100.0)
Platelets: 243 10*3/uL (ref 150–400)
RBC: 4.37 MIL/uL (ref 3.87–5.11)
RDW: 15.9 % — ABNORMAL HIGH (ref 11.5–15.5)
WBC: 6.9 10*3/uL (ref 4.0–10.5)
nRBC: 0 % (ref 0.0–0.2)

## 2021-03-18 LAB — VITAMIN D 25 HYDROXY (VIT D DEFICIENCY, FRACTURES): Vit D, 25-Hydroxy: 31.04 ng/mL (ref 30–100)

## 2021-03-18 LAB — FERRITIN: Ferritin: 19 ng/mL (ref 11–307)

## 2021-03-19 LAB — PTH, INTACT AND CALCIUM
Calcium, Total (PTH): 9 mg/dL (ref 8.7–10.3)
PTH: 79 pg/mL — ABNORMAL HIGH (ref 15–65)

## 2021-03-22 DIAGNOSIS — E876 Hypokalemia: Secondary | ICD-10-CM | POA: Diagnosis not present

## 2021-03-22 DIAGNOSIS — I5032 Chronic diastolic (congestive) heart failure: Secondary | ICD-10-CM | POA: Diagnosis not present

## 2021-03-22 DIAGNOSIS — E211 Secondary hyperparathyroidism, not elsewhere classified: Secondary | ICD-10-CM | POA: Diagnosis not present

## 2021-03-22 DIAGNOSIS — N189 Chronic kidney disease, unspecified: Secondary | ICD-10-CM | POA: Diagnosis not present

## 2021-03-22 DIAGNOSIS — D631 Anemia in chronic kidney disease: Secondary | ICD-10-CM | POA: Diagnosis not present

## 2021-03-22 DIAGNOSIS — E1122 Type 2 diabetes mellitus with diabetic chronic kidney disease: Secondary | ICD-10-CM | POA: Diagnosis not present

## 2021-03-22 DIAGNOSIS — I129 Hypertensive chronic kidney disease with stage 1 through stage 4 chronic kidney disease, or unspecified chronic kidney disease: Secondary | ICD-10-CM | POA: Diagnosis not present

## 2021-03-22 DIAGNOSIS — R808 Other proteinuria: Secondary | ICD-10-CM | POA: Diagnosis not present

## 2021-04-02 DIAGNOSIS — E1129 Type 2 diabetes mellitus with other diabetic kidney complication: Secondary | ICD-10-CM | POA: Diagnosis not present

## 2021-04-02 DIAGNOSIS — I10 Essential (primary) hypertension: Secondary | ICD-10-CM | POA: Diagnosis not present

## 2021-04-04 DIAGNOSIS — E876 Hypokalemia: Secondary | ICD-10-CM | POA: Diagnosis not present

## 2021-04-04 DIAGNOSIS — I129 Hypertensive chronic kidney disease with stage 1 through stage 4 chronic kidney disease, or unspecified chronic kidney disease: Secondary | ICD-10-CM | POA: Diagnosis not present

## 2021-04-04 DIAGNOSIS — R809 Proteinuria, unspecified: Secondary | ICD-10-CM | POA: Diagnosis not present

## 2021-04-04 DIAGNOSIS — E1122 Type 2 diabetes mellitus with diabetic chronic kidney disease: Secondary | ICD-10-CM | POA: Diagnosis not present

## 2021-04-04 DIAGNOSIS — I5032 Chronic diastolic (congestive) heart failure: Secondary | ICD-10-CM | POA: Diagnosis not present

## 2021-04-04 DIAGNOSIS — E1165 Type 2 diabetes mellitus with hyperglycemia: Secondary | ICD-10-CM | POA: Diagnosis not present

## 2021-04-04 DIAGNOSIS — N189 Chronic kidney disease, unspecified: Secondary | ICD-10-CM | POA: Diagnosis not present

## 2021-04-04 DIAGNOSIS — E211 Secondary hyperparathyroidism, not elsewhere classified: Secondary | ICD-10-CM | POA: Diagnosis not present

## 2021-04-04 DIAGNOSIS — E1129 Type 2 diabetes mellitus with other diabetic kidney complication: Secondary | ICD-10-CM | POA: Diagnosis not present

## 2021-04-15 DIAGNOSIS — I10 Essential (primary) hypertension: Secondary | ICD-10-CM | POA: Diagnosis not present

## 2021-04-15 DIAGNOSIS — G4700 Insomnia, unspecified: Secondary | ICD-10-CM | POA: Diagnosis not present

## 2021-04-15 DIAGNOSIS — Z0001 Encounter for general adult medical examination with abnormal findings: Secondary | ICD-10-CM | POA: Diagnosis not present

## 2021-04-15 DIAGNOSIS — J4540 Moderate persistent asthma, uncomplicated: Secondary | ICD-10-CM | POA: Diagnosis not present

## 2021-04-15 DIAGNOSIS — E119 Type 2 diabetes mellitus without complications: Secondary | ICD-10-CM | POA: Diagnosis not present

## 2021-04-15 DIAGNOSIS — T461X5A Adverse effect of calcium-channel blockers, initial encounter: Secondary | ICD-10-CM | POA: Diagnosis not present

## 2021-04-26 ENCOUNTER — Other Ambulatory Visit (HOSPITAL_COMMUNITY): Payer: Self-pay | Admitting: Family Medicine

## 2021-04-26 DIAGNOSIS — Z1231 Encounter for screening mammogram for malignant neoplasm of breast: Secondary | ICD-10-CM

## 2021-05-01 ENCOUNTER — Other Ambulatory Visit (HOSPITAL_COMMUNITY)
Admission: RE | Admit: 2021-05-01 | Discharge: 2021-05-01 | Disposition: A | Discharge status: Home / Self Care | Payer: Medicare Other | Source: Ambulatory Visit | Attending: "Endocrinology | Admitting: "Endocrinology

## 2021-05-01 DIAGNOSIS — E118 Type 2 diabetes mellitus with unspecified complications: Secondary | ICD-10-CM | POA: Insufficient documentation

## 2021-05-01 LAB — COMPREHENSIVE METABOLIC PANEL
ALT: 14 U/L (ref 0–44)
AST: 15 U/L (ref 15–41)
Albumin: 3.8 g/dL (ref 3.5–5.0)
Alkaline Phosphatase: 115 U/L (ref 38–126)
Anion gap: 10 (ref 5–15)
BUN: 36 mg/dL — ABNORMAL HIGH (ref 8–23)
CO2: 27 mmol/L (ref 22–32)
Calcium: 8.6 mg/dL — ABNORMAL LOW (ref 8.9–10.3)
Chloride: 103 mmol/L (ref 98–111)
Creatinine, Ser: 2.03 mg/dL — ABNORMAL HIGH (ref 0.44–1.00)
GFR, Estimated: 25 mL/min — ABNORMAL LOW (ref >60)
Glucose, Bld: 176 mg/dL — ABNORMAL HIGH (ref 70–99)
Potassium: 3.9 mmol/L (ref 3.5–5.1)
Sodium: 140 mmol/L (ref 135–145)
Total Bilirubin: 0.4 mg/dL (ref 0.3–1.2)
Total Protein: 7.2 g/dL (ref 6.5–8.1)

## 2021-05-01 LAB — LIPID PANEL
Cholesterol: 156 mg/dL (ref 0–200)
HDL: 46 mg/dL (ref >40)
LDL Cholesterol: 84 mg/dL (ref 0–99)
Total CHOL/HDL Ratio: 3.4 RATIO
Triglycerides: 132 mg/dL (ref <150)
VLDL: 26 mg/dL (ref 0–40)

## 2021-05-01 LAB — TSH: TSH: 0.619 u[IU]/mL (ref 0.350–4.500)

## 2021-05-01 LAB — T4, FREE: Free T4: 0.98 ng/dL (ref 0.61–1.12)

## 2021-05-05 DIAGNOSIS — E1165 Type 2 diabetes mellitus with hyperglycemia: Secondary | ICD-10-CM | POA: Diagnosis not present

## 2021-05-08 ENCOUNTER — Other Ambulatory Visit: Payer: Self-pay

## 2021-05-08 ENCOUNTER — Other Ambulatory Visit: Payer: Self-pay | Admitting: "Endocrinology

## 2021-05-08 ENCOUNTER — Ambulatory Visit (HOSPITAL_COMMUNITY)
Admission: RE | Admit: 2021-05-08 | Discharge: 2021-05-08 | Disposition: A | Discharge status: Home / Self Care | Payer: Medicare Other | Source: Ambulatory Visit | Attending: Family Medicine | Admitting: Family Medicine

## 2021-05-08 DIAGNOSIS — Z1231 Encounter for screening mammogram for malignant neoplasm of breast: Secondary | ICD-10-CM | POA: Insufficient documentation

## 2021-05-09 ENCOUNTER — Other Ambulatory Visit (HOSPITAL_COMMUNITY): Payer: Self-pay | Admitting: Family Medicine

## 2021-05-09 DIAGNOSIS — R928 Other abnormal and inconclusive findings on diagnostic imaging of breast: Secondary | ICD-10-CM

## 2021-05-13 ENCOUNTER — Ambulatory Visit (INDEPENDENT_AMBULATORY_CARE_PROVIDER_SITE_OTHER): Payer: Medicare Other | Admitting: Nurse Practitioner

## 2021-05-13 ENCOUNTER — Encounter: Payer: Self-pay | Admitting: Nurse Practitioner

## 2021-05-13 ENCOUNTER — Other Ambulatory Visit: Payer: Self-pay

## 2021-05-13 VITALS — BP 125/72 | HR 64 | Ht 64.0 in | Wt 203.4 lb

## 2021-05-13 DIAGNOSIS — E1122 Type 2 diabetes mellitus with diabetic chronic kidney disease: Secondary | ICD-10-CM | POA: Diagnosis not present

## 2021-05-13 DIAGNOSIS — E039 Hypothyroidism, unspecified: Secondary | ICD-10-CM | POA: Diagnosis not present

## 2021-05-13 DIAGNOSIS — E782 Mixed hyperlipidemia: Secondary | ICD-10-CM | POA: Diagnosis not present

## 2021-05-13 DIAGNOSIS — Z794 Long term (current) use of insulin: Secondary | ICD-10-CM

## 2021-05-13 DIAGNOSIS — I10 Essential (primary) hypertension: Secondary | ICD-10-CM | POA: Diagnosis not present

## 2021-05-13 DIAGNOSIS — N184 Chronic kidney disease, stage 4 (severe): Secondary | ICD-10-CM

## 2021-05-13 LAB — POCT GLYCOSYLATED HEMOGLOBIN (HGB A1C): HbA1c, POC (controlled diabetic range): 7 % (ref 0.0–7.0)

## 2021-05-13 MED ORDER — LANTUS SOLOSTAR 100 UNIT/ML ~~LOC~~ SOPN: 25.0000 [IU] | PEN_INJECTOR | Freq: Every day | SUBCUTANEOUS | 2 refills | Status: DC

## 2021-05-13 NOTE — Patient Instructions (Signed)
Diabetes Mellitus Emergency Preparedness Plan ?A diabetes emergency preparedness plan is a checklist to make sure you have everything you need to manage your diabetes in case of an emergency, such as an evacuation, natural disaster, national security emergency, or pandemic lockdown. ?Managing your diabetes is something you have to do all day every day. The American Diabetes Association and the American College of Endocrinology both recommend putting together an emergency diabetes kit. Your kit should include important information and documents as well as all the supplies you will need to manage your diabetes for at least 1 week. Store it in a portable, waterproof bag or container. The best time to start making your emergency kit is now. ?How to make your emergency kit ?Collect information and documents ?Include the following information and documents in your kit: ?The type of diabetes you have. ?A copy of your health insurance cards and photo ID. ?A list of all your other medical conditions, allergies, and surgeries. ?A list of all your medicines and doses with the contact information for your pharmacy. Ask your health care provider for a list of your current medicines. ?Any recent lab results, including your latest hemoglobin A1C (HbA1C). ?The make, model, and serial number of your insulin pump, if you use one. Also include contact information for the manufacturer. ?Contact information for people who should be notified in case of an emergency. Include your health care provider's name, address, and phone number. ?Collect diabetes care items ?Include the following diabetes care items in your kit: ?At least a 1-week supply of: ?Oral medicines. ?Insulin. ?Blood glucose testing supplies. These include testing strips, lancets, and extra batteries for your blood glucose monitor and pump. ?A charger for the continuous glucose monitor (CGM) receiver and pump. ?Any extra supplies needed for your CGM or pump. ?A supply of  glucagon, glucose tablets, juice, soda, or hard candy in case of hypoglycemia. ?Coolers or cold packs. ?A safe container for syringes, needles, and lancets. ? ?Other preparations ?Other things to consider doing as part of your emergency plan: ?Make sure that your mobile phone is charged and that you have an extra charger, cable, or batteries. ?Choose a meeting place for family members. ?Wear a medical alert or ID bracelet. ?If you have a child with diabetes, make sure your child's school has a copy of his or her emergency plan, including the name of the staff member who will assist your child. ?Where to find more information ?American Diabetes Association: www.diabetes.org ?Centers for Disease Control and Prevention: blogs.cdc.gov ?Summary ?A diabetes emergency preparedness plan is a checklist to make sure you have everything you need in case of an emergency. ?Your kit should include important information and documents as well as all the supplies you will need to manage your condition for at least 1 week. ?Store your kit in a portable, waterproof bag or container. ?The best time to start making your emergency kit is now. ?This information is not intended to replace advice given to you by your health care provider. Make sure you discuss any questions you have with your health care provider. ?Document Revised: 08/25/2019 Document Reviewed: 08/25/2019 ?Elsevier Patient Education ? 2022 [employer] ? ?

## 2021-05-13 NOTE — Progress Notes (Addendum)
05/13/2021      Endocrinology follow-up note   Subjective:    Patient ID: Holly Baldwin, female    DOB: 09-Jul-1945,    Past Medical History:  Diagnosis Date   Anxiety    Chronic abdominal pain    Diabetes mellitus    GERD (gastroesophageal reflux disease)    Hiatal hernia    Hypercholesteremia    Hypertension    Past Surgical History:  Procedure Laterality Date   ABDOMINAL HYSTERECTOMY     CATARACT EXTRACTION W/PHACO Right 01/09/2020   Procedure: CATARACT EXTRACTION PHACO AND INTRAOCULAR LENS PLACEMENT ([place]);  Surgeon: Baruch Goldmann, MD;  Location: [facility];  Service: Ophthalmology;  Laterality: Right;  CDE: 12.01   CATARACT EXTRACTION W/PHACO Left 01/23/2020   Procedure: CATARACT EXTRACTION PHACO AND INTRAOCULAR LENS PLACEMENT LEFT EYE;  Surgeon: Baruch Goldmann, MD;  Location: [facility];  Service: Ophthalmology;  Laterality: Left;  CDE 8.71   COLONOSCOPY N/A 06/23/2012   Procedure: COLONOSCOPY;  Surgeon: Rogene Houston, MD;  Location: [facility];  Service: Endoscopy;  Laterality: N/A;  100-moved to 1200 Ann to notify pt   ESOPHAGOGASTRODUODENOSCOPY N/A 05/06/2012   Procedure: ESOPHAGOGASTRODUODENOSCOPY (EGD);  Surgeon: Rogene Houston, MD;  Location: [facility];  Service: Endoscopy;  Laterality: N/A;   MASS EXCISION Right 05/26/2012   Procedure: EXCISION NEOPLASM RIGHT THIGH;  Surgeon: Jamesetta So, MD;  Location: [facility];  Service: General;  Laterality: Right;   Social History   Socioeconomic History   Marital status: Married    Spouse name: Not on file   Number of children: Not on file   Years of education: Not on file   Highest education level: Not on file  Occupational History   Not on file  Tobacco Use   Smoking status: Never   Smokeless tobacco: Never  Vaping Use   Vaping Use: Never used  Substance and Sexual Activity   Alcohol use: No   Drug use: No   Sexual activity: Not Currently    Birth control/protection: Post-menopausal  Other Topics Concern   Not  on file  Social History Narrative   Not on file   Social Determinants of Health   Financial Resource Strain: Not on file  Food Insecurity: Not on file  Transportation Needs: Not on file  Physical Activity: Not on file  Stress: Not on file  Social Connections: Not on file   Outpatient Encounter Medications as of 05/13/2021  Medication Sig   albuterol (PROVENTIL HFA;VENTOLIN HFA) 108 (90 Base) MCG/ACT inhaler Inhale 1-2 puffs into the lungs every 6 (six) hours as needed for wheezing or shortness of breath.    benzonatate (TESSALON) 200 MG capsule Take 200 mg by mouth 3 (three) times daily as needed.   Blood Glucose Monitoring Suppl (ACCU-CHEK AVIVA PLUS) w/Device KIT    calcitRIOL (ROCALTROL) 0.25 MCG capsule Take 0.25 mcg by mouth every Monday, Wednesday, and Friday.   clopidogrel (PLAVIX) 75 MG tablet Take 1 tablet (75 mg total) by mouth daily.   escitalopram (LEXAPRO) 10 MG tablet Take 10 mg by mouth daily.   furosemide (LASIX) 20 MG tablet Take 20 mg by mouth 2 (two) times daily.   glucose blood (ACCU-CHEK AVIVA PLUS) test strip TEST BLOOD GLUCOSE 4 TIMES  DAILY   guaiFENesin-codeine 100-10 MG/5ML syrup Take 10 mLs by mouth every 6 (six) hours as needed.   hydrALAZINE (APRESOLINE) 50 MG tablet Take 50 mg by mouth 2 (two) times daily.   insulin lispro (HUMALOG)  100 UNIT/ML KwikPen Inject 10 Units into the skin 3 (three) times daily. With meals   isosorbide mononitrate (IMDUR) 60 MG 24 hr tablet Take 1 tablet (60 mg total) by mouth daily.   levothyroxine (SYNTHROID) 137 MCG tablet TAKE 1 TABLET (137 MCG TOTAL) BY MOUTH DAILY BEFORE BREAKFAST.   lisinopril (ZESTRIL) 20 MG tablet Take 20 mg by mouth daily.   Magnesium 400 MG TABS Take by mouth.   NIFEdipine (PROCARDIA XL/NIFEDICAL-XL) 90 MG 24 hr tablet Take 90 mg by mouth daily.   olmesartan (BENICAR) 40 MG tablet Take 40 mg by mouth daily.   potassium chloride SA (KLOR-CON M) 20 MEQ tablet Take 20 mEq by mouth daily.   RESTASIS 0.05 %  ophthalmic emulsion 1 drop 2 (two) times daily.   simvastatin (ZOCOR) 40 MG tablet TAKE 1 TABLET EVERY DAY (Patient taking differently: Take 40 mg by mouth daily.)   ziprasidone (GEODON) 80 MG capsule Take 80 mg by mouth daily.   [DISCONTINUED] glipiZIDE (GLUCOTROL) 5 MG tablet Take 5 mg by mouth daily.   [DISCONTINUED] insulin glargine (LANTUS SOLOSTAR) 100 UNIT/ML Solostar Pen Inject 20 Units into the skin at bedtime.   insulin glargine (LANTUS SOLOSTAR) 100 UNIT/ML Solostar Pen Inject 25 Units into the skin at bedtime.   [DISCONTINUED] furosemide (LASIX) 40 MG tablet Take 40 mg by mouth 2 (two) times daily. (Patient not taking: Reported on 05/13/2021)   [DISCONTINUED] gabapentin (NEURONTIN) 100 MG capsule TAKE 1 CAPSULE TWICE DAILY (Patient not taking: Reported on 12/09/2020)   [DISCONTINUED] hydrALAZINE (APRESOLINE) 100 MG tablet Take 1 tablet (100 mg total) by mouth 3 (three) times daily. (Patient not taking: Reported on 05/13/2021)   [DISCONTINUED] lisinopril (ZESTRIL) 30 MG tablet Take 30 mg by mouth daily. (Patient not taking: Reported on 05/13/2021)   [DISCONTINUED] NIFEdipine (PROCARDIA XL/NIFEDICAL XL) 60 MG 24 hr tablet Take 60 mg by mouth daily. (Patient not taking: Reported on 05/13/2021)   [DISCONTINUED] ziprasidone (GEODON) 40 MG capsule Take 40 mg by mouth at bedtime. (Patient not taking: Reported on 05/13/2021)   No facility-administered encounter medications on file as of 05/13/2021.   ALLERGIES: Allergies  Allergen Reactions   Bee Venom Anaphylaxis   Penicillins Hives    .Has patient had a PCN reaction causing immediate rash, facial/tongue/throat swelling, SOB or lightheadedness with hypotension: Yes Has patient had a PCN reaction causing severe rash involving mucus membranes or skin necrosis: No Has patient had a PCN reaction that required hospitalization: Yes Has patient had a PCN reaction occurring within the last 10 years: No If all of the above answers are "NO", then may  proceed with Cephalosporin use.  .Has patient had a PCN reaction causing immediate rash, facial/tongue/throat swelling, SOB or lightheadedness with hypotension: Yes Has patient had a PCN reaction causing severe rash involving mucus membranes or skin necrosis: No Has patient had a PCN reaction that required hospitalization: Yes Has patient had a PCN reaction occurring within the last 10 years: No If all of the above answers are "NO", then may proceed with Cephalosporin use. .Has patient had a PCN reaction causing immediate rash, facial/tongue/throat swelling, SOB or lightheadedness with hypotension: Yes Has patient had a PCN reaction causing severe rash involving mucus membranes or skin necrosis: No Has patient had a PCN reaction that required hospitalization: Yes Has patient had a PCN reaction occurring within the last 10 years: No If all of the above answers are "NO", then may proceed with Cephalosporin use.   VACCINATION STATUS:  There is  no immunization history on file for this patient.  Diabetes She presents for her follow-up diabetic visit. She has type 2 diabetes mellitus. Onset time: she was diagnosed at approximate age of 39 years. Her disease course has been stable. Hypoglycemia symptoms include sweats. Pertinent negatives for hypoglycemia include no confusion, headaches, nervousness/anxiousness, pallor, seizures or tremors. Pertinent negatives for diabetes include no chest pain, no fatigue, no polydipsia, no polyphagia, no polyuria and no weight loss. There are no hypoglycemic complications. Symptoms are stable. Diabetic complications include nephropathy. Risk factors for coronary artery disease include diabetes mellitus, dyslipidemia, obesity, sedentary lifestyle and hypertension. Current diabetic treatment includes intensive insulin program and oral agent (monotherapy). She is compliant with treatment most of the time. Her weight is fluctuating minimally. She is following a generally  healthy diet. When asked about meal planning, she reported none. She has not had a previous visit with a dietitian. She never participates in exercise. Her home blood glucose trend is fluctuating minimally. Her overall blood glucose range is 130-140 mg/dl. (She presents today with her CGM and logs showing at target glycemic profile overall.  Her POCT A1c today is 7%, increasing slightly from last visit of 6.8%.  She does have some fasting hypoglycemia noted in the 50s on several occasions.  Her medications were changed several times since last visit.  Her lantus was reduced to 20 units nightly and she was started on Glipizide.) An ACE inhibitor/angiotensin II receptor blocker is being taken. She does not see a podiatrist.Eye exam is current.  Thyroid Problem Presents for follow-up (She is currently on levothyroxine 137 mcg p.o. nightly.  She reports compliance with medication.) visit. Patient reports no anxiety, cold intolerance, constipation, depressed mood, diarrhea, fatigue, heat intolerance, palpitations, tremors, weight gain or weight loss. The symptoms have been stable. Past treatments include levothyroxine. Her past medical history is significant for diabetes and hyperlipidemia.  Hyperlipidemia This is a chronic problem. The current episode started more than 1 year ago. The problem is controlled. Recent lipid tests were reviewed and are normal. Exacerbating diseases include chronic renal disease, diabetes and obesity. Factors aggravating her hyperlipidemia include beta blockers and fatty foods. Pertinent negatives include no chest pain, myalgias or shortness of breath. Current antihyperlipidemic treatment includes statins. The current treatment provides mild improvement of lipids. Compliance problems include adherence to diet and adherence to exercise.  Risk factors for coronary artery disease include dyslipidemia, diabetes mellitus, hypertension, obesity, a sedentary lifestyle and post-menopausal.   Hypertension This is a chronic problem. The current episode started more than 1 year ago. The problem has been resolved since onset. The problem is controlled. Associated symptoms include sweats. Pertinent negatives include no chest pain, headaches, palpitations or shortness of breath. Agents associated with hypertension include thyroid hormones. Risk factors for coronary artery disease include dyslipidemia, diabetes mellitus, obesity, post-menopausal state and sedentary lifestyle. Past treatments include calcium channel blockers, central alpha agonists and diuretics. The current treatment provides mild improvement. Compliance problems include exercise and diet.  Hypertensive end-organ damage includes kidney disease. Identifiable causes of hypertension include chronic renal disease and a thyroid problem.    Review of systems  Constitutional: + steadily decreasing body weight,  current Body mass index is 34.91 kg/m. , no fatigue, no subjective hyperthermia, no subjective hypothermia Eyes: no blurry vision, no xerophthalmia ENT: no sore throat, no nodules palpated in throat, no dysphagia/odynophagia, no hoarseness Cardiovascular: no chest pain, no shortness of breath, no palpitations, no leg swelling Respiratory: no cough, no shortness of breath Gastrointestinal:  no nausea/vomiting/diarrhea Musculoskeletal: no muscle/joint aches Skin: no rashes, no hyperemia Neurological: no tremors, no numbness, no tingling, no dizziness Psychiatric: no depression, no anxiety   Objective:    BP 125/72    Pulse 64    Ht '5\' 4"'  (1.626 m)    Wt 203 lb 6.4 oz (92.3 kg)    SpO2 99%    BMI 34.91 kg/m   Wt Readings from Last 3 Encounters:  05/13/21 203 lb 6.4 oz (92.3 kg)  12/25/20 204 lb (92.5 kg)  12/09/20 210 lb 5.1 oz (95.4 kg)    BP Readings from Last 3 Encounters:  05/13/21 125/72  02/08/21 (!) 163/66  12/27/20 (!) 148/61     Physical Exam- Limited  Constitutional:  Body mass index is 34.91 kg/m.  , not in acute distress, normal state of mind Eyes:  EOMI, no exophthalmos Neck: Supple Cardiovascular: RRR, no murmurs, rubs, or gallops, no edema Respiratory: Adequate breathing efforts, no crackles, rales, rhonchi, or wheezing Musculoskeletal: no gross deformities, strength intact in all four extremities, no gross restriction of joint movements Skin:  no rashes, no hyperemia Neurological: no tremor with outstretched hands    Results for orders placed or performed in visit on 05/13/21  POCT glycosylated hemoglobin (Hb A1C)  Result Value Ref Range   Hemoglobin A1C     HbA1c POC (<> result, manual entry)     HbA1c, POC (prediabetic range)     HbA1c, POC (controlled diabetic range) 7.0 0.0 - 7.0 %   Diabetic Labs (most recent): Lab Results  Component Value Date   HGBA1C 7.0 05/13/2021   HGBA1C 6.8 (H) 12/09/2020   HGBA1C 6.7 (A) 11/12/2020   Lipid Panel     Component Value Date/Time   CHOL 156 05/01/2021 1013   CHOL 184 05/09/2020 0932   TRIG 132 05/01/2021 1013   HDL 46 05/01/2021 1013   HDL 42 05/09/2020 0932   CHOLHDL 3.4 05/01/2021 1013   VLDL 26 05/01/2021 1013   LDLCALC 84 05/01/2021 1013   LDLCALC 108 (H) 05/09/2020 0932   LDLCALC 105 (H) 12/14/2017 0921     Assessment & Plan:   1) Uncontrolled type 2 diabetes mellitus with complication  Her diabetes is  complicated by CAD,  neuropathy , microalbuminuria, worsening CKD,  and patient remains at a high risk for more acute and chronic complications of diabetes which include CAD, CVA, CKD, retinopathy, and neuropathy. These are all discussed in detail with the patient.  She presents today with her CGM and logs showing at target glycemic profile overall.  Her POCT A1c today is 7%, increasing slightly from last visit of 6.8%.  She does have some fasting hypoglycemia noted in the 50s on several occasions.  Her medications were changed several times since last visit.  Her lantus was reduced to 20 units nightly and she  was started on Glipizide.  Analysis of her CGM shows TIR 81%, TAR 18%, TBR 1% with a GMI of 6.7%.  - Nutritional counseling repeated at each appointment due to patients tendency to fall back in to old habits.  - The patient admits there is a room for improvement in their diet and drink choices. -  Suggestion is made for the patient to avoid simple carbohydrates from their diet including Cakes, Sweet Desserts / Pastries, Ice Cream, Soda (diet and regular), Sweet Tea, Candies, Chips, Cookies, Sweet Pastries, Store Bought Juices, Alcohol in Excess of 1-2 drinks a day, Artificial Sweeteners, Coffee Creamer, and "Sugar-free" Products. This will help  patient to have stable blood glucose profile and potentially avoid unintended weight gain.   - I encouraged the patient to switch to unprocessed or minimally processed complex starch and increased protein intake (animal or plant source), fruits, and vegetables.   - Patient is advised to stick to a routine mealtimes to eat 3 meals a day and avoid unnecessary snacks (to snack only to correct hypoglycemia).  - I have approached patient with the following individualized plan to manage diabetes and patient agrees.  She will continue to require intensive treatment with basal/bolus insulin in order for her to achieve and maintain control of diabetes to target.    -For safety purposes, will discontinue her Glipizide.  With her renal impairment and advanced age, Glipizide increases her risk of hypoglycemia significantly.  -She is advised to increase her Lantus to 25 units SQ nightly and continue her Novolog 10-16 units TID with meals if glucose is above 90 and she is eating (Specific instructions on how to titrate insulin dosage based on glucose readings given to patient in writing).   -she is encouraged to continue monitoring blood glucose (using her CGM) at least 4 times daily, before meals and at bedtime and call the clinic if she has readings less than 70 or  greater than 200 for 3 tests in a row.  - Patient specific target  for A1c; LDL, HDL, Triglycerides, and  Waist Circumference were discussed in detail.  2) BP/HTN:  Her blood pressure is controlled to target.  She is advised to continue Lasix 40 mg po twice daily, Coreg 25 mg po twice daily, Lasix 20 mg po daily, Hydralazine 50 mg po BID, Lisinopril 20 mg po daily and Benicar 40 mg po daily.  Will defer med changes to nephrologist.  3) Lipids/HPL: Her most recent lipid panel on 05/01/21 shows controlled LDL at 84.  She is advised to continue Atorvastatin 40 mg po daily at bedtime.  Side effects and precautions discussed with her.    4)  Weight/Diet:  Her Body mass index is 34.91 kg/m. She will benefit from continued modest weight loss.  CDE consult in progress, exercise, and carbohydrates information provided.  5) Hypothyroidism: -Her previsit TFTs are consistent with appropriate hormone replacement.  She is advised to continue Levothyroxine 137 mg po daily before breakfast.    - We discussed about the correct intake of her thyroid hormone, on empty stomach at fasting, with water, separated by at least 30 minutes from breakfast and other medications,  and separated by more than 4 hours from calcium, iron, multivitamins, acid reflux medications (PPIs). -Patient is made aware of the fact that thyroid hormone replacement is needed for life, dose to be adjusted by periodic monitoring of thyroid function tests.  6) Chronic Care/Health Maintenance: -Patient is on ACE and Statin medications and encouraged to continue to follow up with Ophthalmology, Podiatrist at least yearly or according to recommendations, and advised to  stay away from smoking. I have recommended yearly flu vaccine and pneumonia vaccination at least every 5 years; moderate intensity exercise for up to 150 minutes weekly; and  sleep for at least 7 hours a day.  I advised patient to maintain close follow up with her PCP for primary  care needs.      I spent 37 minutes in the care of the patient today including review of labs from [place], Lipids, Thyroid Function, Hematology (current and previous including abstractions from other facilities); face-to-face time discussing  her blood glucose readings/logs, discussing  hypoglycemia and hyperglycemia episodes and symptoms, medications doses, her options of short and long term treatment based on the latest standards of care / guidelines;  discussion about incorporating lifestyle medicine;  and documenting the encounter.    Please refer to Patient Instructions for Blood Glucose Monitoring and Insulin/Medications Dosing Guide"  in media tab for additional information. Please  also refer to " Patient Self Inventory" in the Media  tab for reviewed elements of pertinent patient history.  Holly Baldwin participated in the discussions, expressed understanding, and voiced agreement with the above plans.  All questions were answered to her satisfaction. she is encouraged to contact clinic should she have any questions or concerns prior to her return visit.    Follow up plan: Return in about 4 months (around 09/12/2021) for Diabetes F/U with A1c in office, Bring meter and logs, No previsit labs.   Rayetta Pigg, [facility] [address] [place], [place] [address] Phone: [phone] Fax: [phone]  05/13/2021, 10:48 AM

## 2021-05-20 ENCOUNTER — Other Ambulatory Visit (HOSPITAL_COMMUNITY)
Admission: RE | Admit: 2021-05-20 | Discharge: 2021-05-20 | Disposition: A | Discharge status: Home / Self Care | Payer: Medicare Other | Source: Ambulatory Visit | Attending: Nephrology | Admitting: Nephrology

## 2021-05-20 DIAGNOSIS — N189 Chronic kidney disease, unspecified: Secondary | ICD-10-CM | POA: Diagnosis not present

## 2021-05-20 LAB — CBC
HCT: 36.1 % (ref 36.0–46.0)
Hemoglobin: 11.3 g/dL — ABNORMAL LOW (ref 12.0–15.0)
MCH: 26 pg (ref 26.0–34.0)
MCHC: 31.3 g/dL (ref 30.0–36.0)
MCV: 83.2 fL (ref 80.0–100.0)
Platelets: 250 10*3/uL (ref 150–400)
RBC: 4.34 MIL/uL (ref 3.87–5.11)
RDW: 16.4 % — ABNORMAL HIGH (ref 11.5–15.5)
WBC: 5.5 10*3/uL (ref 4.0–10.5)
nRBC: 0 % (ref 0.0–0.2)

## 2021-05-20 LAB — RENAL FUNCTION PANEL
Albumin: 3.8 g/dL (ref 3.5–5.0)
Anion gap: 10 (ref 5–15)
BUN: 31 mg/dL — ABNORMAL HIGH (ref 8–23)
CO2: 28 mmol/L (ref 22–32)
Calcium: 8.5 mg/dL — ABNORMAL LOW (ref 8.9–10.3)
Chloride: 100 mmol/L (ref 98–111)
Creatinine, Ser: 2.01 mg/dL — ABNORMAL HIGH (ref 0.44–1.00)
GFR, Estimated: 25 mL/min — ABNORMAL LOW (ref >60)
Glucose, Bld: 144 mg/dL — ABNORMAL HIGH (ref 70–99)
Phosphorus: 4 mg/dL (ref 2.5–4.6)
Potassium: 3.9 mmol/L (ref 3.5–5.1)
Sodium: 138 mmol/L (ref 135–145)

## 2021-05-20 LAB — PROTEIN / CREATININE RATIO, URINE
Creatinine, Urine: 29.12 mg/dL
Protein Creatinine Ratio: 3.23 mg/mg{Cre} — ABNORMAL HIGH (ref 0.00–0.15)
Total Protein, Urine: 94 mg/dL

## 2021-05-23 DIAGNOSIS — E211 Secondary hyperparathyroidism, not elsewhere classified: Secondary | ICD-10-CM | POA: Diagnosis not present

## 2021-05-23 DIAGNOSIS — R808 Other proteinuria: Secondary | ICD-10-CM | POA: Diagnosis not present

## 2021-05-23 DIAGNOSIS — D631 Anemia in chronic kidney disease: Secondary | ICD-10-CM | POA: Diagnosis not present

## 2021-05-23 DIAGNOSIS — E1122 Type 2 diabetes mellitus with diabetic chronic kidney disease: Secondary | ICD-10-CM | POA: Diagnosis not present

## 2021-05-23 DIAGNOSIS — N189 Chronic kidney disease, unspecified: Secondary | ICD-10-CM | POA: Diagnosis not present

## 2021-05-23 DIAGNOSIS — I129 Hypertensive chronic kidney disease with stage 1 through stage 4 chronic kidney disease, or unspecified chronic kidney disease: Secondary | ICD-10-CM | POA: Diagnosis not present

## 2021-05-23 DIAGNOSIS — I5032 Chronic diastolic (congestive) heart failure: Secondary | ICD-10-CM | POA: Diagnosis not present

## 2021-05-23 DIAGNOSIS — N19 Unspecified kidney failure: Secondary | ICD-10-CM | POA: Diagnosis not present

## 2021-06-04 ENCOUNTER — Ambulatory Visit (HOSPITAL_COMMUNITY)
Admission: RE | Admit: 2021-06-04 | Discharge: 2021-06-04 | Disposition: A | Discharge status: Home / Self Care | Payer: Medicare Other | Source: Ambulatory Visit | Attending: Family Medicine | Admitting: Family Medicine

## 2021-06-04 DIAGNOSIS — R922 Inconclusive mammogram: Secondary | ICD-10-CM | POA: Diagnosis not present

## 2021-06-04 DIAGNOSIS — R928 Other abnormal and inconclusive findings on diagnostic imaging of breast: Secondary | ICD-10-CM

## 2021-06-05 DIAGNOSIS — E1165 Type 2 diabetes mellitus with hyperglycemia: Secondary | ICD-10-CM | POA: Diagnosis not present

## 2021-06-19 ENCOUNTER — Other Ambulatory Visit: Payer: Self-pay | Admitting: Nurse Practitioner

## 2021-07-06 DIAGNOSIS — E1165 Type 2 diabetes mellitus with hyperglycemia: Secondary | ICD-10-CM | POA: Diagnosis not present

## 2021-07-08 DIAGNOSIS — M549 Dorsalgia, unspecified: Secondary | ICD-10-CM | POA: Diagnosis not present

## 2021-07-12 DIAGNOSIS — E1129 Type 2 diabetes mellitus with other diabetic kidney complication: Secondary | ICD-10-CM | POA: Diagnosis not present

## 2021-07-12 DIAGNOSIS — J069 Acute upper respiratory infection, unspecified: Secondary | ICD-10-CM | POA: Diagnosis not present

## 2021-07-12 DIAGNOSIS — D869 Sarcoidosis, unspecified: Secondary | ICD-10-CM | POA: Diagnosis not present

## 2021-07-17 ENCOUNTER — Other Ambulatory Visit (HOSPITAL_COMMUNITY)
Admission: RE | Admit: 2021-07-17 | Discharge: 2021-07-17 | Disposition: A | Discharge status: Home / Self Care | Payer: Medicare Other | Source: Ambulatory Visit | Attending: Nephrology | Admitting: Nephrology

## 2021-07-17 DIAGNOSIS — I129 Hypertensive chronic kidney disease with stage 1 through stage 4 chronic kidney disease, or unspecified chronic kidney disease: Secondary | ICD-10-CM | POA: Diagnosis not present

## 2021-07-17 DIAGNOSIS — N189 Chronic kidney disease, unspecified: Secondary | ICD-10-CM | POA: Insufficient documentation

## 2021-07-17 DIAGNOSIS — D631 Anemia in chronic kidney disease: Secondary | ICD-10-CM | POA: Insufficient documentation

## 2021-07-17 DIAGNOSIS — E1122 Type 2 diabetes mellitus with diabetic chronic kidney disease: Secondary | ICD-10-CM | POA: Diagnosis not present

## 2021-07-17 DIAGNOSIS — E211 Secondary hyperparathyroidism, not elsewhere classified: Secondary | ICD-10-CM | POA: Insufficient documentation

## 2021-07-17 DIAGNOSIS — R808 Other proteinuria: Secondary | ICD-10-CM | POA: Diagnosis not present

## 2021-07-17 DIAGNOSIS — N19 Unspecified kidney failure: Secondary | ICD-10-CM | POA: Diagnosis not present

## 2021-07-17 DIAGNOSIS — I5032 Chronic diastolic (congestive) heart failure: Secondary | ICD-10-CM | POA: Insufficient documentation

## 2021-07-17 LAB — IRON AND TIBC
Iron: 41 ug/dL (ref 28–170)
Saturation Ratios: 15 % (ref 10.4–31.8)
TIBC: 274 ug/dL (ref 250–450)
UIBC: 233 ug/dL

## 2021-07-17 LAB — CBC
HCT: 36.3 % (ref 36.0–46.0)
Hemoglobin: 11.5 g/dL — ABNORMAL LOW (ref 12.0–15.0)
MCH: 26.2 pg (ref 26.0–34.0)
MCHC: 31.7 g/dL (ref 30.0–36.0)
MCV: 82.7 fL (ref 80.0–100.0)
Platelets: 302 10*3/uL (ref 150–400)
RBC: 4.39 MIL/uL (ref 3.87–5.11)
RDW: 16.2 % — ABNORMAL HIGH (ref 11.5–15.5)
WBC: 5.4 10*3/uL (ref 4.0–10.5)
nRBC: 0 % (ref 0.0–0.2)

## 2021-07-17 LAB — RENAL FUNCTION PANEL
Albumin: 3.7 g/dL (ref 3.5–5.0)
Anion gap: 10 (ref 5–15)
BUN: 41 mg/dL — ABNORMAL HIGH (ref 8–23)
CO2: 29 mmol/L (ref 22–32)
Calcium: 8.2 mg/dL — ABNORMAL LOW (ref 8.9–10.3)
Chloride: 102 mmol/L (ref 98–111)
Creatinine, Ser: 2.14 mg/dL — ABNORMAL HIGH (ref 0.44–1.00)
GFR, Estimated: 23 mL/min — ABNORMAL LOW (ref >60)
Glucose, Bld: 191 mg/dL — ABNORMAL HIGH (ref 70–99)
Phosphorus: 4.1 mg/dL (ref 2.5–4.6)
Potassium: 3.7 mmol/L (ref 3.5–5.1)
Sodium: 141 mmol/L (ref 135–145)

## 2021-07-17 LAB — PROTEIN / CREATININE RATIO, URINE
Creatinine, Urine: 51.43 mg/dL
Protein Creatinine Ratio: 1.22 mg/mg{Cre} — ABNORMAL HIGH (ref 0.00–0.15)
Total Protein, Urine: 63 mg/dL

## 2021-07-18 LAB — PTH, INTACT AND CALCIUM
Calcium, Total (PTH): 8.4 mg/dL — ABNORMAL LOW (ref 8.7–10.3)
PTH: 116 pg/mL — ABNORMAL HIGH (ref 15–65)

## 2021-08-06 DIAGNOSIS — E1165 Type 2 diabetes mellitus with hyperglycemia: Secondary | ICD-10-CM | POA: Diagnosis not present

## 2021-08-07 DIAGNOSIS — N189 Chronic kidney disease, unspecified: Secondary | ICD-10-CM | POA: Diagnosis not present

## 2021-08-07 DIAGNOSIS — E1122 Type 2 diabetes mellitus with diabetic chronic kidney disease: Secondary | ICD-10-CM | POA: Diagnosis not present

## 2021-08-07 DIAGNOSIS — E211 Secondary hyperparathyroidism, not elsewhere classified: Secondary | ICD-10-CM | POA: Diagnosis not present

## 2021-08-07 DIAGNOSIS — R808 Other proteinuria: Secondary | ICD-10-CM | POA: Diagnosis not present

## 2021-08-07 DIAGNOSIS — I5032 Chronic diastolic (congestive) heart failure: Secondary | ICD-10-CM | POA: Diagnosis not present

## 2021-08-07 DIAGNOSIS — D631 Anemia in chronic kidney disease: Secondary | ICD-10-CM | POA: Diagnosis not present

## 2021-08-07 DIAGNOSIS — I129 Hypertensive chronic kidney disease with stage 1 through stage 4 chronic kidney disease, or unspecified chronic kidney disease: Secondary | ICD-10-CM | POA: Diagnosis not present

## 2021-08-16 ENCOUNTER — Other Ambulatory Visit: Payer: Self-pay | Admitting: Nurse Practitioner

## 2021-08-19 ENCOUNTER — Telehealth: Payer: Self-pay | Admitting: Nurse Practitioner

## 2021-08-19 MED ORDER — PEN NEEDLES 31G X 8 MM MISC: 2 refills | Status: DC

## 2021-08-19 NOTE — Telephone Encounter (Signed)
Rx sent 

## 2021-08-19 NOTE — Telephone Encounter (Signed)
Pt called and said she is needing pen needles sent to [facility]. She said the needles that were last sent in is too short.

## 2021-08-30 DIAGNOSIS — I10 Essential (primary) hypertension: Secondary | ICD-10-CM | POA: Diagnosis not present

## 2021-08-30 DIAGNOSIS — E1129 Type 2 diabetes mellitus with other diabetic kidney complication: Secondary | ICD-10-CM | POA: Diagnosis not present

## 2021-09-06 DIAGNOSIS — E1165 Type 2 diabetes mellitus with hyperglycemia: Secondary | ICD-10-CM | POA: Diagnosis not present

## 2021-09-11 ENCOUNTER — Encounter: Payer: Self-pay | Admitting: Nurse Practitioner

## 2021-09-11 ENCOUNTER — Ambulatory Visit (INDEPENDENT_AMBULATORY_CARE_PROVIDER_SITE_OTHER): Payer: Medicare Other | Admitting: Nurse Practitioner

## 2021-09-11 VITALS — BP 145/70 | HR 62 | Ht 64.0 in | Wt 201.0 lb

## 2021-09-11 DIAGNOSIS — Z794 Long term (current) use of insulin: Secondary | ICD-10-CM

## 2021-09-11 DIAGNOSIS — N184 Chronic kidney disease, stage 4 (severe): Secondary | ICD-10-CM | POA: Diagnosis not present

## 2021-09-11 DIAGNOSIS — I10 Essential (primary) hypertension: Secondary | ICD-10-CM

## 2021-09-11 DIAGNOSIS — E782 Mixed hyperlipidemia: Secondary | ICD-10-CM | POA: Diagnosis not present

## 2021-09-11 DIAGNOSIS — E1122 Type 2 diabetes mellitus with diabetic chronic kidney disease: Secondary | ICD-10-CM | POA: Diagnosis not present

## 2021-09-11 DIAGNOSIS — E039 Hypothyroidism, unspecified: Secondary | ICD-10-CM | POA: Diagnosis not present

## 2021-09-11 LAB — POCT GLYCOSYLATED HEMOGLOBIN (HGB A1C): HbA1c POC (<> result, manual entry): 7.4 % (ref 4.0–5.6)

## 2021-09-11 NOTE — Progress Notes (Signed)
09/11/2021      Endocrinology follow-up note   Subjective:    Patient ID: Holly Baldwin, female    DOB: November 05, 1945,    Past Medical History:  Diagnosis Date   Anxiety    Chronic abdominal pain    Diabetes mellitus    GERD (gastroesophageal reflux disease)    Hiatal hernia    Hypercholesteremia    Hypertension    Past Surgical History:  Procedure Laterality Date   ABDOMINAL HYSTERECTOMY     CATARACT EXTRACTION W/PHACO Right 01/09/2020   Procedure: CATARACT EXTRACTION PHACO AND INTRAOCULAR LENS PLACEMENT ([place]);  Surgeon: Baruch Goldmann, MD;  Location: [facility];  Service: Ophthalmology;  Laterality: Right;  CDE: 12.01   CATARACT EXTRACTION W/PHACO Left 01/23/2020   Procedure: CATARACT EXTRACTION PHACO AND INTRAOCULAR LENS PLACEMENT LEFT EYE;  Surgeon: Baruch Goldmann, MD;  Location: [facility];  Service: Ophthalmology;  Laterality: Left;  CDE 8.71   COLONOSCOPY N/A 06/23/2012   Procedure: COLONOSCOPY;  Surgeon: Rogene Houston, MD;  Location: [facility];  Service: Endoscopy;  Laterality: N/A;  100-moved to 1200 Ann to notify pt   ESOPHAGOGASTRODUODENOSCOPY N/A 05/06/2012   Procedure: ESOPHAGOGASTRODUODENOSCOPY ([facility]);  Surgeon: Rogene Houston, MD;  Location: [facility];  Service: Endoscopy;  Laterality: N/A;   MASS EXCISION Right 05/26/2012   Procedure: EXCISION NEOPLASM RIGHT THIGH;  Surgeon: Jamesetta So, MD;  Location: [facility];  Service: General;  Laterality: Right;   Social History   Socioeconomic History   Marital status: Married    Spouse name: Not on file   Number of children: Not on file   Years of education: Not on file   Highest education level: Not on file  Occupational History   Not on file  Tobacco Use   Smoking status: Never   Smokeless tobacco: Never  Vaping Use   Vaping Use: Never used  Substance and Sexual Activity   Alcohol use: No   Drug use: No   Sexual activity: Not Currently    Birth control/protection: Post-menopausal  Other Topics Concern   Not  on file  Social History Narrative   Not on file   Social Determinants of Health   Financial Resource Strain: High Risk (05/07/2020)   Overall Financial Resource Strain (CARDIA)    Difficulty of Paying Living Expenses: Very hard  Food Insecurity: No Food Insecurity (05/07/2020)   Hunger Vital Sign    Worried About Running Out of Food in the Last Year: Never true    Ran Out of Food in the Last Year: Never true  Transportation Needs: No Transportation Needs (05/07/2020)   PRAPARE - Hydrologist (Medical): No    Lack of Transportation (Non-Medical): No  Physical Activity: Inactive (05/07/2020)   Exercise Vital Sign    Days of Exercise per Week: 0 days    Minutes of Exercise per Session: 0 min  Stress: No Stress Concern Present (05/07/2020)   [place]    Feeling of Stress : Not at all  Social Connections: Socially Isolated (05/07/2020)   Social Connection and Isolation Panel [NHANES]    Frequency of Communication with Friends and Family: Once a week    Frequency of Social Gatherings with Friends and Family: Never    Attends Religious Services: Never    Marine scientist or Organizations: No    Attends Archivist Meetings: Never    Marital Status: Married  Outpatient Encounter Medications as of 09/11/2021  Medication Sig   dapagliflozin propanediol (FARXIGA) 5 MG TABS tablet Take by mouth daily.   Finerenone (KERENDIA) 10 MG TABS Take by mouth daily.   albuterol (PROVENTIL HFA;VENTOLIN HFA) 108 (90 Base) MCG/ACT inhaler Inhale 1-2 puffs into the lungs every 6 (six) hours as needed for wheezing or shortness of breath.    calcitRIOL (ROCALTROL) 0.25 MCG capsule Take 0.25 mcg by mouth every Monday, Wednesday, and Friday.   clopidogrel (PLAVIX) 75 MG tablet Take 1 tablet (75 mg total) by mouth daily.   escitalopram (LEXAPRO) 10 MG tablet Take 10 mg by mouth daily.   furosemide (LASIX) 20  MG tablet Take 20 mg by mouth 2 (two) times daily.   hydrALAZINE (APRESOLINE) 50 MG tablet Take 50 mg by mouth 2 (two) times daily.   insulin glargine (LANTUS SOLOSTAR) 100 UNIT/ML Solostar Pen Inject 25 Units into the skin at bedtime. (Patient taking differently: Inject 24 Units into the skin at bedtime.)   insulin lispro (HUMALOG) 100 UNIT/ML KwikPen INJECT UP TO 10-16 UNITS 3 TIMES DAILY BEFORE MEALS.   Insulin Pen Needle (PEN NEEDLES) 31G X 8 MM MISC Use to take insulin 4 times daily   isosorbide mononitrate (IMDUR) 60 MG 24 hr tablet Take 1 tablet (60 mg total) by mouth daily.   levothyroxine (SYNTHROID) 137 MCG tablet TAKE 1 TABLET (137 MCG TOTAL) BY MOUTH DAILY BEFORE BREAKFAST.   Magnesium 400 MG TABS Take by mouth.   NIFEdipine (PROCARDIA XL/NIFEDICAL-XL) 90 MG 24 hr tablet Take 90 mg by mouth daily.   olmesartan (BENICAR) 40 MG tablet Take 40 mg by mouth daily.   potassium chloride SA (KLOR-CON M) 20 MEQ tablet Take 20 mEq by mouth daily. (Patient not taking: Reported on 09/11/2021)   RESTASIS 0.05 % ophthalmic emulsion 1 drop 2 (two) times daily.   simvastatin (ZOCOR) 40 MG tablet TAKE 1 TABLET EVERY DAY (Patient taking differently: Take 40 mg by mouth daily.)   [DISCONTINUED] benzonatate (TESSALON) 200 MG capsule Take 200 mg by mouth 3 (three) times daily as needed.   [DISCONTINUED] Blood Glucose Monitoring Suppl (ACCU-CHEK AVIVA PLUS) w/Device KIT    [DISCONTINUED] glucose blood (ACCU-CHEK AVIVA PLUS) test strip TEST BLOOD GLUCOSE 4 TIMES  DAILY   [DISCONTINUED] guaiFENesin-codeine 100-10 MG/5ML syrup Take 10 mLs by mouth every 6 (six) hours as needed.   [DISCONTINUED] lisinopril (ZESTRIL) 20 MG tablet Take 20 mg by mouth daily.   [DISCONTINUED] ziprasidone (GEODON) 80 MG capsule Take 80 mg by mouth daily.   No facility-administered encounter medications on file as of 09/11/2021.   ALLERGIES: Allergies  Allergen Reactions   Bee Venom Anaphylaxis   Penicillins Hives    .Has  patient had a PCN reaction causing immediate rash, facial/tongue/throat swelling, SOB or lightheadedness with hypotension: Yes Has patient had a PCN reaction causing severe rash involving mucus membranes or skin necrosis: No Has patient had a PCN reaction that required hospitalization: Yes Has patient had a PCN reaction occurring within the last 10 years: No If all of the above answers are "NO", then may proceed with Cephalosporin use.  .Has patient had a PCN reaction causing immediate rash, facial/tongue/throat swelling, SOB or lightheadedness with hypotension: Yes Has patient had a PCN reaction causing severe rash involving mucus membranes or skin necrosis: No Has patient had a PCN reaction that required hospitalization: Yes Has patient had a PCN reaction occurring within the last 10 years: No If all of the above answers are "NO",  then may proceed with Cephalosporin use. .Has patient had a PCN reaction causing immediate rash, facial/tongue/throat swelling, SOB or lightheadedness with hypotension: Yes Has patient had a PCN reaction causing severe rash involving mucus membranes or skin necrosis: No Has patient had a PCN reaction that required hospitalization: Yes Has patient had a PCN reaction occurring within the last 10 years: No If all of the above answers are "NO", then may proceed with Cephalosporin use.   VACCINATION STATUS:  There is no immunization history on file for this patient.  Diabetes She presents for her follow-up diabetic visit. She has type 2 diabetes mellitus. Onset time: she was diagnosed at approximate age of 71 years. Her disease course has been stable. There are no hypoglycemic associated symptoms. Pertinent negatives for hypoglycemia include no confusion, headaches, nervousness/anxiousness, pallor, seizures or tremors. Pertinent negatives for diabetes include no chest pain, no fatigue, no polydipsia, no polyphagia, no polyuria and no weight loss. There are no  hypoglycemic complications. Symptoms are stable. Diabetic complications include nephropathy. Risk factors for coronary artery disease include diabetes mellitus, dyslipidemia, obesity, sedentary lifestyle and hypertension. Current diabetic treatment includes intensive insulin program and oral agent (monotherapy). She is compliant with treatment most of the time. Her weight is decreasing steadily. She is following a generally healthy diet. When asked about meal planning, she reported none. She has not had a previous visit with a dietitian. She never participates in exercise. Her home blood glucose trend is fluctuating minimally. Her overall blood glucose range is 140-180 mg/dl. (She presents today with her CGM and logs showing mostly at goal glycemic profile overall.  Her POCT A1c today is 7.4%, increasing slightly from last visit of 7% yet still an acceptable range for her.  She was started on Farxiga 5 mg by cardiologist, tolerating well, no signs of UTI or yeast.  She denies any hypoglycemia.  Analysis of her CGM shows TIR 69%, TAR 31%, TBR 0% with a GMI of 7.3%.) An ACE inhibitor/angiotensin II receptor blocker is being taken. She does not see a podiatrist.Eye exam is current.  Thyroid Problem Presents for follow-up (She is currently on levothyroxine 137 mcg p.o. nightly.  She reports compliance with medication.) visit. Patient reports no anxiety, cold intolerance, constipation, depressed mood, diarrhea, fatigue, heat intolerance, palpitations, tremors, weight gain or weight loss. The symptoms have been stable. Past treatments include levothyroxine. Her past medical history is significant for diabetes and hyperlipidemia.  Hyperlipidemia This is a chronic problem. The current episode started more than 1 year ago. The problem is controlled. Recent lipid tests were reviewed and are normal. Exacerbating diseases include chronic renal disease, diabetes and obesity. Factors aggravating her hyperlipidemia include  beta blockers and fatty foods. Pertinent negatives include no chest pain, myalgias or shortness of breath. Current antihyperlipidemic treatment includes statins. The current treatment provides mild improvement of lipids. Compliance problems include adherence to diet and adherence to exercise.  Risk factors for coronary artery disease include dyslipidemia, diabetes mellitus, hypertension, obesity, a sedentary lifestyle and post-menopausal.  Hypertension This is a chronic problem. The current episode started more than 1 year ago. The problem has been resolved since onset. The problem is controlled. Pertinent negatives include no chest pain, headaches, palpitations or shortness of breath. Agents associated with hypertension include thyroid hormones. Risk factors for coronary artery disease include dyslipidemia, diabetes mellitus, obesity, post-menopausal state and sedentary lifestyle. Past treatments include calcium channel blockers, central alpha agonists and diuretics. The current treatment provides mild improvement. Compliance problems include exercise and  diet.  Hypertensive end-organ damage includes kidney disease. Identifiable causes of hypertension include chronic renal disease and a thyroid problem.     Review of systems  Constitutional: + steadily decreasing body weight,  current Body mass index is 34.5 kg/m. , no fatigue, no subjective hyperthermia, no subjective hypothermia Eyes: no blurry vision, no xerophthalmia ENT: no sore throat, no nodules palpated in throat, no dysphagia/odynophagia, no hoarseness Cardiovascular: no chest pain, no shortness of breath, no palpitations, no leg swelling Respiratory: no cough, no shortness of breath Gastrointestinal: no nausea/vomiting/diarrhea Musculoskeletal: no muscle/joint aches Skin: no rashes, no hyperemia Neurological: no tremors, no numbness, no tingling, no dizziness Psychiatric: no depression, no anxiety   Objective:    BP (!) 145/70    Pulse 62   Ht '5\' 4"'  (1.626 m)   Wt 201 lb (91.2 kg)   BMI 34.50 kg/m   Wt Readings from Last 3 Encounters:  09/11/21 201 lb (91.2 kg)  05/13/21 203 lb 6.4 oz (92.3 kg)  12/25/20 204 lb (92.5 kg)    BP Readings from Last 3 Encounters:  09/11/21 (!) 145/70  05/13/21 125/72  02/08/21 (!) 163/66     Physical Exam- Limited  Constitutional:  Body mass index is 34.5 kg/m. , not in acute distress, normal state of mind Eyes:  EOMI, no exophthalmos Neck: Supple Cardiovascular: RRR, no murmurs, rubs, or gallops, no edema Respiratory: Adequate breathing efforts, no crackles, rales, rhonchi, or wheezing Musculoskeletal: no gross deformities, strength intact in all four extremities, no gross restriction of joint movements Skin:  no rashes, no hyperemia Neurological: no tremor with outstretched hands  Diabetic Foot Exam - Simple   Simple Foot Form Diabetic Foot exam was performed with the following findings: Yes 09/11/2021  9:45 AM  Visual Inspection See comments: Yes Sensation Testing Intact to touch and monofilament testing bilaterally: Yes Pulse Check Posterior Tibialis and Dorsalis pulse intact bilaterally: Yes Comments Mild onychomycosis bilaterally- nails in need of trim     Results for orders placed or performed in visit on 09/11/21  HgB A1c  Result Value Ref Range   Hemoglobin A1C     HbA1c POC (<> result, manual entry) 7.4 4.0 - 5.6 %   HbA1c, POC (prediabetic range)     HbA1c, POC (controlled diabetic range)     Diabetic Labs (most recent): Lab Results  Component Value Date   HGBA1C 7.4 09/11/2021   HGBA1C 7.0 05/13/2021   HGBA1C 6.8 (H) 12/09/2020   MICROALBUR 172.4 12/14/2017   MICROALBUR >600.0 03/09/2017   MICROALBUR 10.7 01/23/2016   Lipid Panel     Component Value Date/Time   CHOL 156 05/01/2021 1013   CHOL 184 05/09/2020 0932   TRIG 132 05/01/2021 1013   HDL 46 05/01/2021 1013   HDL 42 05/09/2020 0932   CHOLHDL 3.4 05/01/2021 1013   VLDL 26  05/01/2021 1013   LDLCALC 84 05/01/2021 1013   LDLCALC 108 (H) 05/09/2020 0932   LDLCALC 105 (H) 12/14/2017 0921     Assessment & Plan:   1) Controlled type 2 diabetes mellitus with complication  Her diabetes is complicated by CAD, neuropathy, microalbuminuria, worsening CKD,  and patient remains at a high risk for more acute and chronic complications of diabetes which include CAD, CVA, CKD, retinopathy, and neuropathy. These are all discussed in detail with the patient.  She presents today with her CGM and logs showing mostly at goal glycemic profile overall.  Her POCT A1c today is 7.4%, increasing slightly from last visit  of 7% yet still an acceptable range for her.  She was started on Farxiga 5 mg by cardiologist, tolerating well, no signs of UTI or yeast.  She denies any hypoglycemia.  Analysis of her CGM shows TIR 69%, TAR 31%, TBR 0% with a GMI of 7.3%.  - Nutritional counseling repeated at each appointment due to patients tendency to fall back in to old habits.  - The patient admits there is a room for improvement in their diet and drink choices. -  Suggestion is made for the patient to avoid simple carbohydrates from their diet including Cakes, Sweet Desserts / Pastries, Ice Cream, Soda (diet and regular), Sweet Tea, Candies, Chips, Cookies, Sweet Pastries, Store Bought Juices, Alcohol in Excess of 1-2 drinks a day, Artificial Sweeteners, Coffee Creamer, and "Sugar-free" Products. This will help patient to have stable blood glucose profile and potentially avoid unintended weight gain.   - I encouraged the patient to switch to unprocessed or minimally processed complex starch and increased protein intake (animal or plant source), fruits, and vegetables.   - Patient is advised to stick to a routine mealtimes to eat 3 meals a day and avoid unnecessary snacks (to snack only to correct hypoglycemia).  - I have approached patient with the following individualized plan to manage diabetes and  patient agrees.  She will continue to require intensive treatment with basal/bolus insulin in order for her to achieve and maintain control of diabetes to target.    -For safety purposes, Glipizide should be avoided. With her renal impairment and advanced age, Glipizide increases her risk of hypoglycemia significantly.  -Given her stable glycemic profile, no changes will be made to her medications today.  She is advised to increase her Lantus to 24 units SQ nightly and continue her Novolog 10-16 units TID with meals if glucose is above 90 and she is eating (Specific instructions on how to titrate insulin dosage based on glucose readings given to patient in writing).   She can also continue Farxiga 5 mg po daily as recommended by her nephrologist.  -she is encouraged to continue monitoring blood glucose (using her CGM) at least 4 times daily, before meals and at bedtime and call the clinic if she has readings less than 70 or greater than 200 for 3 tests in a row.  - Patient specific target  for A1c; LDL, HDL, Triglycerides, and  Waist Circumference were discussed in detail.  2) BP/HTN:  Her blood pressure is controlled to target.  She is advised to continue Lasix 40 mg po twice daily, Coreg 25 mg po twice daily, Lasix 20 mg po daily, Hydralazine 100 mg po BID,  Spironolactone 25 mg daily, and Benicar 40 mg po daily.  Will defer med changes to nephrologist.  3) Lipids/HPL: Her most recent lipid panel on 05/01/21 shows controlled LDL at 84.  She is advised to continue Atorvastatin 40 mg po daily at bedtime.  Side effects and precautions discussed with her.    4)  Weight/Diet:  Her Body mass index is 34.5 kg/m. She will benefit from continued modest weight loss.  CDE consult in progress, exercise, and carbohydrates information provided.  5) Hypothyroidism: -There are no recent TFTs to review.  She is advised to continue Levothyroxine 137 mg po daily before breakfast.  Will recheck thyroid labs prior  to next visit.   - We discussed about the correct intake of her thyroid hormone, on empty stomach at fasting, with water, separated by at least 30 minutes from  breakfast and other medications,  and separated by more than 4 hours from calcium, iron, multivitamins, acid reflux medications (PPIs). -Patient is made aware of the fact that thyroid hormone replacement is needed for life, dose to be adjusted by periodic monitoring of thyroid function tests.  6) Chronic Care/Health Maintenance: -Patient is on ACE and Statin medications and encouraged to continue to follow up with Ophthalmology, Podiatrist at least yearly or according to recommendations, and advised to  stay away from smoking. I have recommended yearly flu vaccine and pneumonia vaccination at least every 5 years; moderate intensity exercise for up to 150 minutes weekly; and  sleep for at least 7 hours a day.  I advised patient to maintain close follow up with her PCP for primary care needs.      I spent 42 minutes in the care of the patient today including review of labs from [facility], Lipids, Thyroid Function, Hematology (current and previous including abstractions from other facilities); face-to-face time discussing  her blood glucose readings/logs, discussing hypoglycemia and hyperglycemia episodes and symptoms, medications doses, her options of short and long term treatment based on the latest standards of care / guidelines;  discussion about incorporating lifestyle medicine;  and documenting the encounter. Risk reduction counseling performed per USPSTF guidelines to reduce obesity and cardiovascular risk factors.     Please refer to Patient Instructions for Blood Glucose Monitoring and Insulin/Medications Dosing Guide"  in media tab for additional information. Please  also refer to " Patient Self Inventory" in the Media  tab for reviewed elements of pertinent patient history.  Holly Baldwin participated in the discussions, expressed  understanding, and voiced agreement with the above plans.  All questions were answered to her satisfaction. she is encouraged to contact clinic should she have any questions or concerns prior to her return visit.    Follow up plan: Return in about 4 months (around 01/12/2022) for Diabetes F/U with A1c in office, Thyroid follow up, Previsit labs, Bring meter and logs.   Rayetta Pigg, [facility] [address] [place], [place] [address] Phone: [phone] Fax: [phone]  09/11/2021, 9:46 AM

## 2021-09-11 NOTE — Patient Instructions (Signed)

## 2021-09-12 ENCOUNTER — Ambulatory Visit: Payer: Medicare Other | Admitting: Nurse Practitioner

## 2021-09-18 ENCOUNTER — Ambulatory Visit: Payer: Self-pay | Admitting: *Deleted

## 2021-09-18 NOTE — Patient Outreach (Signed)
[facility]) Care Management  09/18/2021  Holly Baldwin 1945-07-23 [id number]   RN Health Coach telephone call to patient.  [employer] compliance verified.  RN described the services available of RN, Pharmacy and Education officer, museum. Per patient she needs education on her hypertension. She owns her home and needs bars in her bathroom. She needs assistance with utilities. Patient consented to the services of [facility].  Plan:  Refer to Care guide for coordination  [place] Management [phone]

## 2021-09-19 ENCOUNTER — Encounter: Payer: Self-pay | Admitting: *Deleted

## 2021-09-19 NOTE — Telephone Encounter (Signed)
This encounter was created in error - please disregard.

## 2021-10-03 ENCOUNTER — Telehealth: Payer: Self-pay | Admitting: *Deleted

## 2021-10-03 ENCOUNTER — Encounter: Payer: Self-pay | Admitting: *Deleted

## 2021-10-03 NOTE — Telephone Encounter (Signed)
This encounter was created in error - please disregard.

## 2021-10-03 NOTE — Chronic Care Management (AMB) (Signed)
  Care Coordination   Note   10/03/2021 Name: Holly Baldwin MRN: [id number] DOB: [birth year]  Holly Baldwin is a 76 y.o. year old female who sees Sharilyn Sites, MD for primary care. I reached out to Holly Baldwin by phone today to offer care coordination services.  Ms. Temkin was given information about Care Coordination services today including:   The Care Coordination services include support from the care team which includes your Nurse Coordinator, Clinical Social Worker, or Pharmacist.  The Care Coordination team is here to help remove barriers to the health concerns and goals most important to you. Care Coordination services are voluntary, and the patient may decline or stop services at any time by request to their care team member.   Care Coordination Consent Status: Patient agreed to services and verbal consent obtained.   Follow up plan:  Telephone appointment with care coordination team member scheduled for:  10/04/21  Encounter Outcome:  Pt. Scheduled  [place]  Direct Dial: [phone]

## 2021-10-04 ENCOUNTER — Ambulatory Visit: Payer: Self-pay | Admitting: *Deleted

## 2021-10-04 NOTE — Patient Outreach (Signed)
  Care Coordination   10/04/2021 Name: Holly Baldwin MRN: [id number] DOB: 04-11-1945   Care Coordination Outreach Attempts:  An unsuccessful telephone outreach was attempted today to offer the patient information about available care coordination services as a benefit of their health plan.   Follow Up Plan:  Additional outreach attempts will be made to offer the patient care coordination information and services.   Encounter Outcome:  No Answer  Care Coordination Interventions Activated:  No   Care Coordination Interventions:  No, not indicated     Valente David, RN, MSN, [facility] Care Coordinator [phone]

## 2021-10-07 DIAGNOSIS — E1165 Type 2 diabetes mellitus with hyperglycemia: Secondary | ICD-10-CM | POA: Diagnosis not present

## 2021-10-16 ENCOUNTER — Telehealth: Payer: Self-pay | Admitting: *Deleted

## 2021-10-16 ENCOUNTER — Other Ambulatory Visit (HOSPITAL_COMMUNITY)
Admission: RE | Admit: 2021-10-16 | Discharge: 2021-10-16 | Disposition: A | Discharge status: Home / Self Care | Payer: Medicare Other | Source: Ambulatory Visit | Attending: Nephrology | Admitting: Nephrology

## 2021-10-16 DIAGNOSIS — E1122 Type 2 diabetes mellitus with diabetic chronic kidney disease: Secondary | ICD-10-CM | POA: Insufficient documentation

## 2021-10-16 DIAGNOSIS — R808 Other proteinuria: Secondary | ICD-10-CM | POA: Diagnosis not present

## 2021-10-16 DIAGNOSIS — I5032 Chronic diastolic (congestive) heart failure: Secondary | ICD-10-CM | POA: Diagnosis not present

## 2021-10-16 DIAGNOSIS — E211 Secondary hyperparathyroidism, not elsewhere classified: Secondary | ICD-10-CM | POA: Insufficient documentation

## 2021-10-16 DIAGNOSIS — D631 Anemia in chronic kidney disease: Secondary | ICD-10-CM | POA: Insufficient documentation

## 2021-10-16 DIAGNOSIS — N189 Chronic kidney disease, unspecified: Secondary | ICD-10-CM | POA: Insufficient documentation

## 2021-10-16 DIAGNOSIS — I130 Hypertensive heart and chronic kidney disease with heart failure and stage 1 through stage 4 chronic kidney disease, or unspecified chronic kidney disease: Secondary | ICD-10-CM | POA: Diagnosis not present

## 2021-10-16 LAB — RENAL FUNCTION PANEL
Albumin: 3.9 g/dL (ref 3.5–5.0)
Anion gap: 9 (ref 5–15)
BUN: 41 mg/dL — ABNORMAL HIGH (ref 8–23)
CO2: 27 mmol/L (ref 22–32)
Calcium: 8.7 mg/dL — ABNORMAL LOW (ref 8.9–10.3)
Chloride: 101 mmol/L (ref 98–111)
Creatinine, Ser: 2.68 mg/dL — ABNORMAL HIGH (ref 0.44–1.00)
GFR, Estimated: 18 mL/min — ABNORMAL LOW (ref >60)
Glucose, Bld: 213 mg/dL — ABNORMAL HIGH (ref 70–99)
Phosphorus: 4.8 mg/dL — ABNORMAL HIGH (ref 2.5–4.6)
Potassium: 3.7 mmol/L (ref 3.5–5.1)
Sodium: 137 mmol/L (ref 135–145)

## 2021-10-16 LAB — PROTEIN / CREATININE RATIO, URINE
Creatinine, Urine: 79.64 mg/dL
Protein Creatinine Ratio: 0.57 mg/mg{Cre} — ABNORMAL HIGH (ref 0.00–0.15)
Total Protein, Urine: 45 mg/dL

## 2021-10-16 LAB — CBC
HCT: 39 % (ref 36.0–46.0)
Hemoglobin: 12.2 g/dL (ref 12.0–15.0)
MCH: 26 pg (ref 26.0–34.0)
MCHC: 31.3 g/dL (ref 30.0–36.0)
MCV: 83 fL (ref 80.0–100.0)
Platelets: 281 10*3/uL (ref 150–400)
RBC: 4.7 MIL/uL (ref 3.87–5.11)
RDW: 15.7 % — ABNORMAL HIGH (ref 11.5–15.5)
WBC: 5 10*3/uL (ref 4.0–10.5)
nRBC: 0 % (ref 0.0–0.2)

## 2021-10-16 NOTE — Chronic Care Management (AMB) (Signed)
  Care Coordination  Outreach Note  10/16/2021 Name: Holly Baldwin MRN: [id number] DOB: Aug 15, 1945   Care Coordination Outreach Attempts  A second unsuccessful outreach was attempted today to offer the patient with information about available care coordination services as a benefit of their health plan.     Follow Up Plan:  No further outreach attempts will be made at this time. We have been unable to contact the patient to offer or enroll patient in care coordination services  Encounter Outcome:  No Answer  Julian Hy, [place] Direct Dial: [phone]

## 2021-10-23 DIAGNOSIS — R808 Other proteinuria: Secondary | ICD-10-CM | POA: Diagnosis not present

## 2021-10-23 DIAGNOSIS — I129 Hypertensive chronic kidney disease with stage 1 through stage 4 chronic kidney disease, or unspecified chronic kidney disease: Secondary | ICD-10-CM | POA: Diagnosis not present

## 2021-10-23 DIAGNOSIS — E211 Secondary hyperparathyroidism, not elsewhere classified: Secondary | ICD-10-CM | POA: Diagnosis not present

## 2021-10-23 DIAGNOSIS — E1122 Type 2 diabetes mellitus with diabetic chronic kidney disease: Secondary | ICD-10-CM | POA: Diagnosis not present

## 2021-10-23 DIAGNOSIS — D631 Anemia in chronic kidney disease: Secondary | ICD-10-CM | POA: Diagnosis not present

## 2021-10-23 DIAGNOSIS — I5032 Chronic diastolic (congestive) heart failure: Secondary | ICD-10-CM | POA: Diagnosis not present

## 2021-10-23 DIAGNOSIS — N189 Chronic kidney disease, unspecified: Secondary | ICD-10-CM | POA: Diagnosis not present

## 2021-10-23 DIAGNOSIS — N170 Acute kidney failure with tubular necrosis: Secondary | ICD-10-CM | POA: Diagnosis not present

## 2021-10-23 DIAGNOSIS — N19 Unspecified kidney failure: Secondary | ICD-10-CM | POA: Diagnosis not present

## 2021-10-29 ENCOUNTER — Other Ambulatory Visit (HOSPITAL_COMMUNITY): Payer: Self-pay | Admitting: Family Medicine

## 2021-10-29 DIAGNOSIS — N6310 Unspecified lump in the right breast, unspecified quadrant: Secondary | ICD-10-CM

## 2021-11-01 ENCOUNTER — Emergency Department (HOSPITAL_COMMUNITY): Payer: Medicare Other

## 2021-11-01 ENCOUNTER — Emergency Department (HOSPITAL_COMMUNITY)
Admission: EM | Admit: 2021-11-01 | Discharge: 2021-11-01 | Disposition: A | Discharge status: Home / Self Care | Payer: Medicare Other | Attending: Emergency Medicine | Admitting: Emergency Medicine

## 2021-11-01 ENCOUNTER — Encounter (HOSPITAL_COMMUNITY): Payer: Self-pay | Admitting: *Deleted

## 2021-11-01 ENCOUNTER — Other Ambulatory Visit: Payer: Self-pay

## 2021-11-01 DIAGNOSIS — E1122 Type 2 diabetes mellitus with diabetic chronic kidney disease: Secondary | ICD-10-CM | POA: Diagnosis not present

## 2021-11-01 DIAGNOSIS — S199XXA Unspecified injury of neck, initial encounter: Secondary | ICD-10-CM | POA: Diagnosis not present

## 2021-11-01 DIAGNOSIS — Z794 Long term (current) use of insulin: Secondary | ICD-10-CM | POA: Diagnosis not present

## 2021-11-01 DIAGNOSIS — S20222A Contusion of left back wall of thorax, initial encounter: Secondary | ICD-10-CM | POA: Insufficient documentation

## 2021-11-01 DIAGNOSIS — I130 Hypertensive heart and chronic kidney disease with heart failure and stage 1 through stage 4 chronic kidney disease, or unspecified chronic kidney disease: Secondary | ICD-10-CM | POA: Diagnosis not present

## 2021-11-01 DIAGNOSIS — I5033 Acute on chronic diastolic (congestive) heart failure: Secondary | ICD-10-CM | POA: Diagnosis not present

## 2021-11-01 DIAGNOSIS — Z79899 Other long term (current) drug therapy: Secondary | ICD-10-CM | POA: Insufficient documentation

## 2021-11-01 DIAGNOSIS — M546 Pain in thoracic spine: Secondary | ICD-10-CM | POA: Diagnosis not present

## 2021-11-01 DIAGNOSIS — R079 Chest pain, unspecified: Secondary | ICD-10-CM | POA: Diagnosis not present

## 2021-11-01 DIAGNOSIS — R519 Headache, unspecified: Secondary | ICD-10-CM | POA: Diagnosis not present

## 2021-11-01 DIAGNOSIS — Z7902 Long term (current) use of antithrombotics/antiplatelets: Secondary | ICD-10-CM | POA: Diagnosis not present

## 2021-11-01 DIAGNOSIS — S299XXA Unspecified injury of thorax, initial encounter: Secondary | ICD-10-CM | POA: Diagnosis not present

## 2021-11-01 DIAGNOSIS — S3992XA Unspecified injury of lower back, initial encounter: Secondary | ICD-10-CM | POA: Diagnosis present

## 2021-11-01 DIAGNOSIS — W19XXXA Unspecified fall, initial encounter: Secondary | ICD-10-CM | POA: Insufficient documentation

## 2021-11-01 DIAGNOSIS — N183 Chronic kidney disease, stage 3 unspecified: Secondary | ICD-10-CM | POA: Diagnosis not present

## 2021-11-01 LAB — BASIC METABOLIC PANEL
Anion gap: 12 (ref 5–15)
BUN: 36 mg/dL — ABNORMAL HIGH (ref 8–23)
CO2: 23 mmol/L (ref 22–32)
Calcium: 8.7 mg/dL — ABNORMAL LOW (ref 8.9–10.3)
Chloride: 98 mmol/L (ref 98–111)
Creatinine, Ser: 2 mg/dL — ABNORMAL HIGH (ref 0.44–1.00)
GFR, Estimated: 25 mL/min — ABNORMAL LOW (ref >60)
Glucose, Bld: 178 mg/dL — ABNORMAL HIGH (ref 70–99)
Potassium: 4.2 mmol/L (ref 3.5–5.1)
Sodium: 133 mmol/L — ABNORMAL LOW (ref 135–145)

## 2021-11-01 LAB — CBC WITH DIFFERENTIAL/PLATELET
Abs Immature Granulocytes: 0.03 10*3/uL (ref 0.00–0.07)
Basophils Absolute: 0.1 10*3/uL (ref 0.0–0.1)
Basophils Relative: 1 %
Eosinophils Absolute: 0.1 10*3/uL (ref 0.0–0.5)
Eosinophils Relative: 1 %
HCT: 37.1 % (ref 36.0–46.0)
Hemoglobin: 12.1 g/dL (ref 12.0–15.0)
Immature Granulocytes: 0 %
Lymphocytes Relative: 18 %
Lymphs Abs: 1.5 10*3/uL (ref 0.7–4.0)
MCH: 26.9 pg (ref 26.0–34.0)
MCHC: 32.6 g/dL (ref 30.0–36.0)
MCV: 82.4 fL (ref 80.0–100.0)
Monocytes Absolute: 0.9 10*3/uL (ref 0.1–1.0)
Monocytes Relative: 10 %
Neutro Abs: 6.1 10*3/uL (ref 1.7–7.7)
Neutrophils Relative %: 70 %
Platelets: 334 10*3/uL (ref 150–400)
RBC: 4.5 MIL/uL (ref 3.87–5.11)
RDW: 15.2 % (ref 11.5–15.5)
WBC: 8.6 10*3/uL (ref 4.0–10.5)
nRBC: 0 % (ref 0.0–0.2)

## 2021-11-01 MED ORDER — OXYCODONE HCL 5 MG PO TABS: 2.5000 mg | ORAL_TABLET | Freq: Four times a day (QID) | ORAL | 0 refills | Status: DC | PRN

## 2021-11-01 MED ORDER — OXYCODONE-ACETAMINOPHEN 5-325 MG PO TABS
1.0000 | ORAL_TABLET | Freq: Once | ORAL | Status: AC
  Administered 2021-11-01: 1 via ORAL
  Filled 2021-11-01: qty 1

## 2021-11-01 NOTE — Discharge Instructions (Addendum)
We evaluated you in the emergency department after your fall.  We obtained CT scans of your head, neck, back, chest.  We did not see any sign of any rib fractures, spine fractures, or injuries.  Your symptoms are most likely due to bruising of the back.  Please take Tylenol for your pain.  Please take 1000 mg of Tylenol (2 extra strength Tylenol) every 6 hours as needed for pain.  If this does not relieve your pain, I have prescribed a small dose of oxycodone to take on top of the Tylenol.  Please only take this if needed and do not drive or drink alcohol when taking this medicine.  This medicine can increase your risk of falling so be very careful if you take this medicine.  I have also prescribed a lidocaine patch which you can apply to your back to help numb the skin.  Please follow-up with your primary doctor.  Please return to the emergency department if you develop any new or worsening symptoms such as severe pain, difficulty breathing, numbness or tingling, headaches, vomiting, or any other new symptoms.

## 2021-11-01 NOTE — ED Triage Notes (Signed)
Pt in from home c/o fall today off of the porch 2 foot fall, denies hitting her head, pt c/o upper back pain, reports taking a blood thinner, A&O x4

## 2021-11-01 NOTE — ED Provider Notes (Signed)
[facility] EMERGENCY DEPARTMENT Provider Note  CSN: [id number] Arrival date & time: 11/01/21 1520  Chief Complaint(s) Fall  HPI Holly Baldwin is a 76 y.o. female with history of diabetes, hypertension, hyperlipidemia presenting after fall.  Patient reports that she was cleaning her porch when she fell backward, landing on her back.  She denies hitting her head.  Denies any neck pain, low back pain.  Denies any shortness of breath or difficulty breathing.  Reports her symptoms are constant, worse with movement.  Has not taken anything for pain yet.  Denies numbness or tingling.   Past Medical History Past Medical History:  Diagnosis Date  . Anxiety   . Chronic abdominal pain   . Diabetes mellitus   . GERD (gastroesophageal reflux disease)   . Hiatal hernia   . Hypercholesteremia   . Hypertension    Patient Active Problem List   Diagnosis Date Noted  . TIA (transient ischemic attack) 12/08/2020  . Hypercholesteremia   . Hypertension   . Chronic diastolic CHF (congestive heart failure) ([place])   . Acute on chronic diastolic CHF (congestive heart failure) ([place]) 08/21/2018  . Abnormal liver function   . Hypertensive emergency   . Acute CHF (congestive heart failure) ([place]) 08/20/2018  . Hypokalemia 08/20/2018  . Anemia 08/20/2018  . Acute renal failure superimposed on stage 3 chronic kidney disease ([place]) 08/20/2018  . Acute CHF ([place]) 08/20/2018  . Acute respiratory failure with hypoxia ([place])   . Vitamin D deficiency 03/17/2017  . Class 2 severe obesity due to excess calories with serious comorbidity and body mass index (BMI) of 38.0 to 38.9 in adult ([place]) 07/10/2015  . Essential hypertension, benign 01/02/2015  . Mixed hyperlipidemia 01/02/2015  . Unspecified gastritis and gastroduodenitis without mention of hemorrhage 05/06/2012  . Abdominal pain 05/05/2012  . Nausea & vomiting 05/05/2012  . Depression 05/05/2012  . GERD (gastroesophageal reflux disease) 05/05/2012  . DM2  (diabetes mellitus, type 2) ([place]) 05/05/2012  . Primary hypothyroidism 05/05/2012  . Hiatal hernia 05/05/2012  . Malignant hypertension 05/05/2012  . SHOULDER PAIN 03/25/2007  . IMPINGEMENT SYNDROME 03/25/2007  . RUPTURE ROTATOR CUFF 03/25/2007  . HIGH BLOOD PRESSURE 03/25/2007   Home Medication(s) Prior to Admission medications   Medication Sig Start Date End Date Taking? Authorizing Provider  oxyCODONE (ROXICODONE) 5 MG immediate release tablet Take 0.5 tablets (2.5 mg total) by mouth every 6 (six) hours as needed. 11/01/21  Yes Cristie Hem, MD  albuterol (PROVENTIL HFA;VENTOLIN HFA) 108 (90 Base) MCG/ACT inhaler Inhale 1-2 puffs into the lungs every 6 (six) hours as needed for wheezing or shortness of breath.     [provider]  calcitRIOL (ROCALTROL) 0.25 MCG capsule Take 0.25 mcg by mouth every Monday, Wednesday, and Friday. 11/28/20   [provider]  clopidogrel (PLAVIX) 75 MG tablet Take 1 tablet (75 mg total) by mouth daily. 12/10/20 12/10/21  Lavina Hamman, MD  dapagliflozin propanediol (FARXIGA) 5 MG TABS tablet Take by mouth daily.    [provider]  escitalopram (LEXAPRO) 10 MG tablet Take 10 mg by mouth daily. 04/11/21   [provider]  Finerenone (KERENDIA) 10 MG TABS Take by mouth daily.    [provider]  furosemide (LASIX) 20 MG tablet Take 20 mg by mouth 2 (two) times daily. 04/05/21   [provider]  hydrALAZINE (APRESOLINE) 50 MG tablet Take 50 mg by mouth 2 (two) times daily. 03/22/21   [provider]  insulin glargine (LANTUS SOLOSTAR)  100 UNIT/ML Solostar Pen Inject 25 Units into the skin at bedtime. Patient taking differently: Inject 24 Units into the skin at bedtime. 05/13/21   Brita Romp, NP  insulin lispro (HUMALOG) 100 UNIT/ML KwikPen INJECT UP TO 10-16 UNITS 3 TIMES DAILY BEFORE MEALS. 08/16/21   Brita Romp, NP  Insulin Pen Needle (PEN NEEDLES) 31G X 8 MM MISC Use to take insulin  4 times daily 08/19/21   Brita Romp, NP  isosorbide mononitrate (IMDUR) 60 MG 24 hr tablet Take 1 tablet (60 mg total) by mouth daily. 08/26/18   Barton Dubois, MD  levothyroxine (SYNTHROID) 137 MCG tablet TAKE 1 TABLET (137 MCG TOTAL) BY MOUTH DAILY BEFORE BREAKFAST. 12/20/19   Cassandria Anger, MD  Magnesium 400 MG TABS Take by mouth.    [provider]  NIFEdipine (PROCARDIA XL/NIFEDICAL-XL) 90 MG 24 hr tablet Take 90 mg by mouth daily. 05/11/21   [provider]  olmesartan (BENICAR) 40 MG tablet Take 40 mg by mouth daily. 04/11/21   [provider]  potassium chloride SA (KLOR-CON M) 20 MEQ tablet Take 20 mEq by mouth daily. Patient not taking: Reported on 09/11/2021 03/22/21   [provider]  RESTASIS 0.05 % ophthalmic emulsion 1 drop 2 (two) times daily. 12/17/20   [provider]  simvastatin (ZOCOR) 40 MG tablet TAKE 1 TABLET EVERY DAY Patient taking differently: Take 40 mg by mouth daily. 10/05/17   Fayrene Helper, MD                                                                                                                                    Past Surgical History Past Surgical History:  Procedure Laterality Date  . ABDOMINAL HYSTERECTOMY    . CATARACT EXTRACTION W/PHACO Right 01/09/2020   Procedure: CATARACT EXTRACTION PHACO AND INTRAOCULAR LENS PLACEMENT (IOC);  Surgeon: Baruch Goldmann, MD;  Location: [facility];  Service: Ophthalmology;  Laterality: Right;  CDE: 12.01  . CATARACT EXTRACTION W/PHACO Left 01/23/2020   Procedure: CATARACT EXTRACTION PHACO AND INTRAOCULAR LENS PLACEMENT LEFT EYE;  Surgeon: Baruch Goldmann, MD;  Location: [facility];  Service: Ophthalmology;  Laterality: Left;  CDE 8.71  . COLONOSCOPY N/A 06/23/2012   Procedure: COLONOSCOPY;  Surgeon: Rogene Houston, MD;  Location: [facility] SUITE;  Service: Endoscopy;  Laterality: N/A;  100-moved to 1200 Ann to notify pt  . ESOPHAGOGASTRODUODENOSCOPY N/A 05/06/2012    Procedure: ESOPHAGOGASTRODUODENOSCOPY (EGD);  Surgeon: Rogene Houston, MD;  Location: [facility];  Service: Endoscopy;  Laterality: N/A;  . MASS EXCISION Right 05/26/2012   Procedure: EXCISION NEOPLASM RIGHT THIGH;  Surgeon: Jamesetta So, MD;  Location: [facility];  Service: General;  Laterality: Right;   Family History Family History  Problem Relation Age of Onset  . Diabetes Mother   . Diabetes Father   . Stroke Father   . Diabetes Sister   . Diabetes Brother   .  Colon cancer Neg Hx   . Colon polyps Neg Hx     Social History Social History   Tobacco Use  . Smoking status: Never  . Smokeless tobacco: Never  Vaping Use  . Vaping Use: Never used  Substance Use Topics  . Alcohol use: No  . Drug use: No   Allergies Bee venom and Penicillins  Review of Systems Review of Systems  All other systems reviewed and are negative.   Physical Exam Vital Signs  I have reviewed the triage vital signs BP (!) 159/68   Pulse 66   Ht '5\' 4"'$  (1.626 m)   SpO2 100%   BMI 34.50 kg/m  Physical Exam Vitals and nursing note reviewed.  Constitutional:      General: She is not in acute distress.    Appearance: She is well-developed.  HENT:     Head: Normocephalic and atraumatic.     Mouth/Throat:     Mouth: Mucous membranes are moist.  Eyes:     Pupils: Pupils are equal, round, and reactive to light.  Cardiovascular:     Rate and Rhythm: Normal rate and regular rhythm.     Heart sounds: No murmur heard. Pulmonary:     Effort: Pulmonary effort is normal. No respiratory distress.     Breath sounds: Normal breath sounds.  Abdominal:     General: Abdomen is flat.     Palpations: Abdomen is soft.     Tenderness: There is no abdominal tenderness.  Musculoskeletal:        General: No tenderness.     Right lower leg: No edema.     Left lower leg: No edema.     Comments: Intact range of motion of the bilateral upper and lower extremities.  No midline C-spine tenderness, mild diffuse  midline thoracic tenderness without step-off.  No lumbar spine tenderness.  Large swelling/hematoma to the left paraspinal thoracic back  Skin:    General: Skin is warm and dry.  Neurological:     General: No focal deficit present.     Mental Status: She is alert. Mental status is at baseline.  Psychiatric:        Mood and Affect: Mood normal.        Behavior: Behavior normal.     ED Results and Treatments Labs (all labs ordered are listed, but only abnormal results are displayed) Labs Reviewed  BASIC METABOLIC PANEL - Abnormal; Notable for the following components:      Result Value   Sodium 133 (*)    Glucose, Bld 178 (*)    BUN 36 (*)    Creatinine, Ser 2.00 (*)    Calcium 8.7 (*)    GFR, Estimated 25 (*)    All other components within normal limits  CBC WITH DIFFERENTIAL/PLATELET  Radiology CT Chest Wo Contrast  Addendum Date: 11/01/2021   ADDENDUM REPORT: 11/01/2021 18:26 ADDENDUM: Please note findings should state "Old healed right 8th posterior rib fracture." As well as possible nondisplaced sternal fracture-correlate with point tenderness to palpation. These results were called by telephone at the time of interpretation on 11/01/2021 at 6:25 pm to provider Garnette Gunner , who verbally acknowledged these results. Electronically Signed   By: Iven Finn M.D.   On: 11/01/2021 18:26   Result Date: 11/01/2021 CLINICAL DATA:  Chest trauma, blunt EXAM: CT CHEST WITHOUT CONTRAST TECHNIQUE: Multidetector CT imaging of the chest was performed following the standard protocol without IV contrast. RADIATION DOSE REDUCTION: This exam was performed according to the departmental dose-optimization program which includes automated exposure control, adjustment of the mA and/or kV according to patient size and/or use of iterative reconstruction technique. COMPARISON:  None  Available. FINDINGS: Cardiovascular: The thoracic aorta is normal in caliber. Mild atherosclerotic plaque. The heart is normal in size. No significant pericardial effusion. Lungs/Pleura: No focal consolidation. No pulmonary nodule. No pulmonary mass. No pulmonary contusion or laceration. No pneumatocele formation. No pleural effusion. No pneumothorax. No hemothorax. Mediastinum/Nodes: No pneumomediastinum. The central airways are patent. The esophagus is unremarkable.  At least small volume hiatal hernia. The thyroid is unremarkable. Limited evaluation for hilar lymphadenopathy on this noncontrast study. No mediastinal or axillary lymphadenopathy. Musculoskeletal/Chest wall No chest wall mass. Right superior breast superficial subcutaneus soft tissue fluid collection measuring 1.9 x 1.1 cm likely representing a sebaceous cyst. No acute rib or sternal fracture. Please see separately dictated CT thoracic spine 11/01/2021. Upper abdomen: Status post cholecystectomy.  Atherosclerotic plaque. IMPRESSION: 1. No acute traumatic injury to the chest with limited evaluation on this noncontrast study. 2. Please see separately dictated CT thoracic spine 11/01/2021. 3. Other imaging findings of potential clinical significance: Small volume hiatal hernia. Aortic Atherosclerosis (ICD10-I70.0). Electronically Signed: By: Iven Finn M.D. On: 11/01/2021 17:46   CT T-SPINE NO CHARGE  Addendum Date: 11/01/2021   ADDENDUM REPORT: 11/01/2021 18:25 ADDENDUM: Please note the "old healed left transverse process fracture of the T8 level" mention a findings is incorrect and should state: "Old healed RIGHT 8th posterior rib fracture." Represents a posterior these results were called by telephone at the time of interpretation on 11/01/2021 at 6:23 pm to provider Garnette Gunner , who verbally acknowledged these results. Electronically Signed   By: Iven Finn M.D.   On: 11/01/2021 18:25   Result Date: 11/01/2021 CLINICAL DATA:  Fall  today from front porch with pain in upper back EXAM: CT THORACIC SPINE WITHOUT CONTRAST TECHNIQUE: Multidetector CT images of the thoracic were obtained using the standard protocol without intravenous contrast. RADIATION DOSE REDUCTION: This exam was performed according to the departmental dose-optimization program which includes automated exposure control, adjustment of the mA and/or kV according to patient size and/or use of iterative reconstruction technique. COMPARISON:  None Available. FINDINGS: Alignment: Normal. Vertebrae: Multilevel moderate degenerative changes of the spine with endplate sclerosis at the T5-T6 levels. Likely old healed left transverse process fracture at the T8 level. No acute fracture or focal pathologic process. Paraspinal and other soft tissues: Negative. Disc levels: Maintained. IMPRESSION: No acute displaced fracture or traumatic listhesis of the thoracic spine. Electronically Signed: By: Iven Finn M.D. On: 11/01/2021 17:49   CT Cervical Spine Wo Contrast  Result Date: 11/01/2021 CLINICAL DATA:  Neck trauma (Age >= 65y); Headache, new or worsening (Age >= 50y). Fall today from front porch with pain in  upper back EXAM: CT HEAD WITHOUT CONTRAST CT CERVICAL SPINE WITHOUT CONTRAST TECHNIQUE: Multidetector CT imaging of the head and cervical spine was performed following the standard protocol without intravenous contrast. Multiplanar CT image reconstructions of the cervical spine were also generated. RADIATION DOSE REDUCTION: This exam was performed according to the departmental dose-optimization program which includes automated exposure control, adjustment of the mA and/or kV according to patient size and/or use of iterative reconstruction technique. COMPARISON:  None Available. FINDINGS: CT HEAD FINDINGS BRAIN: BRAIN Patchy and confluent areas of decreased attenuation are noted throughout the deep and periventricular white matter of the cerebral hemispheres bilaterally,  compatible with chronic microvascular ischemic disease. No evidence of large-territorial acute infarction. No parenchymal hemorrhage. No mass lesion. No extra-axial collection. No mass effect or midline shift. No hydrocephalus. Basilar cisterns are patent. Vascular: No hyperdense vessel. Skull: No acute fracture or focal lesion. Sinuses/Orbits: Paranasal sinuses and mastoid air cells are clear. Bilateral lens replacement. Otherwise the orbits are unremarkable. Other: None. CT CERVICAL SPINE FINDINGS Alignment: Normal. Skull base and vertebrae: Multilevel mild degenerative changes spine. No associated severe osseous neural foraminal or central canal stenosis. no acute fracture. No aggressive appearing focal osseous lesion or focal pathologic process. Soft tissues and spinal canal: No prevertebral fluid or swelling. No visible canal hematoma. Upper chest: Unremarkable. Other: None. IMPRESSION: 1. No acute intracranial abnormality. 2. No acute displaced fracture or traumatic listhesis of the cervical spine. Electronically Signed   By: Iven Finn M.D.   On: 11/01/2021 17:43   CT Head Wo Contrast  Result Date: 11/01/2021 CLINICAL DATA:  Neck trauma (Age >= 65y); Headache, new or worsening (Age >= 50y). Fall today from front porch with pain in upper back EXAM: CT HEAD WITHOUT CONTRAST CT CERVICAL SPINE WITHOUT CONTRAST TECHNIQUE: Multidetector CT imaging of the head and cervical spine was performed following the standard protocol without intravenous contrast. Multiplanar CT image reconstructions of the cervical spine were also generated. RADIATION DOSE REDUCTION: This exam was performed according to the departmental dose-optimization program which includes automated exposure control, adjustment of the mA and/or kV according to patient size and/or use of iterative reconstruction technique. COMPARISON:  None Available. FINDINGS: CT HEAD FINDINGS BRAIN: BRAIN Patchy and confluent areas of decreased attenuation are  noted throughout the deep and periventricular white matter of the cerebral hemispheres bilaterally, compatible with chronic microvascular ischemic disease. No evidence of large-territorial acute infarction. No parenchymal hemorrhage. No mass lesion. No extra-axial collection. No mass effect or midline shift. No hydrocephalus. Basilar cisterns are patent. Vascular: No hyperdense vessel. Skull: No acute fracture or focal lesion. Sinuses/Orbits: Paranasal sinuses and mastoid air cells are clear. Bilateral lens replacement. Otherwise the orbits are unremarkable. Other: None. CT CERVICAL SPINE FINDINGS Alignment: Normal. Skull base and vertebrae: Multilevel mild degenerative changes spine. No associated severe osseous neural foraminal or central canal stenosis. no acute fracture. No aggressive appearing focal osseous lesion or focal pathologic process. Soft tissues and spinal canal: No prevertebral fluid or swelling. No visible canal hematoma. Upper chest: Unremarkable. Other: None. IMPRESSION: 1. No acute intracranial abnormality. 2. No acute displaced fracture or traumatic listhesis of the cervical spine. Electronically Signed   By: Iven Finn M.D.   On: 11/01/2021 17:43   DG Chest 1 View  Result Date: 11/01/2021 CLINICAL DATA:  Chest pain, recent fall EXAM: CHEST  1 VIEW COMPARISON:  08/27/2018 FINDINGS: Transverse diameter of heart is slightly increased. Thoracic aorta is tortuous. Lung fields are clear of any infiltrate or pulmonary edema.  There is no pleural effusion or pneumothorax. Degenerative changes are noted in right shoulder. IMPRESSION: No active cardiopulmonary disease. Electronically Signed   By: Elmer Picker M.D.   On: 11/01/2021 17:04    Pertinent labs & imaging results that were available during my care of the patient were reviewed by me and considered in my medical decision making (see MDM for details).  Medications Ordered in ED Medications  oxyCODONE-acetaminophen  (PERCOCET/ROXICET) 5-325 MG per tablet 1 tablet (1 tablet Oral Given 11/01/21 1828)                                                                                                                                     Procedures Procedures  (including critical care time)  Medical Decision Making / ED Course   MDM:  76 year old female presenting with pain after accident.  Given exam, concern for possible rib fracture, spinal fracture.  Will obtain CT chest and CT thoracic spine as well as CT neck and head given age although no history of head trauma or neck pain.  No low back tenderness to suggest lumbar pathology.  Will treat pain and closely reassess.  Will obtain chest x-ray to evaluate for pneumothorax although patient without respiratory distress.  Clinical Course as of 11/01/21 1839  Ludwig Clarks Nov 01, 2021  1838 I reviewed CT scans.  I discussed the CT scans with the radiologist.  Patient does have a old appearing eighth rib fracture, on discussion with radiologist this does not appear acute.  Suspect bruising or contusion to the back.  Patient currently feels well.  Will discharge with oxycodone and around-the-clock Tylenol.  Discussed oxycodone precautions with the patient given elderly age. Will discharge patient to home. All questions answered. Patient comfortable with plan of discharge. Return precautions discussed with patient and specified on the after visit summary.  [WS]    Clinical Course User Index [WS] Truett Mainland, Livingston Diones, MD     Additional history obtained: -Additional history obtained from family -External records from outside source obtained and reviewed including: Chart review including previous notes, labs, imaging, consultation notes   Lab Tests: -I ordered, reviewed, and interpreted labs.   The pertinent results include:   Labs Reviewed  BASIC METABOLIC PANEL - Abnormal; Notable for the following components:      Result Value   Sodium 133 (*)    Glucose, Bld 178  (*)    BUN 36 (*)    Creatinine, Ser 2.00 (*)    Calcium 8.7 (*)    GFR, Estimated 25 (*)    All other components within normal limits  CBC WITH DIFFERENTIAL/PLATELET      EKG   EKG Interpretation  Date/Time:    Ventricular Rate:    PR Interval:    QRS Duration:   QT Interval:    QTC Calculation:   R Axis:     Text Interpretation:  Imaging Studies ordered: I ordered imaging studies including CT head, neck, chest, CXR On my interpretation imaging demonstrates old rib fx. I discussed with radiologist. I independently visualized and interpreted imaging. I agree with the radiologist interpretation   Medicines ordered and prescription drug management: Meds ordered this encounter  Medications  . oxyCODONE-acetaminophen (PERCOCET/ROXICET) 5-325 MG per tablet 1 tablet  . oxyCODONE (ROXICODONE) 5 MG immediate release tablet    Sig: Take 0.5 tablets (2.5 mg total) by mouth every 6 (six) hours as needed.    Dispense:  10 tablet    Refill:  0    -I have reviewed the patients home medicines and have made adjustments as needed   Reevaluation: After the interventions noted above, I reevaluated the patient and found that they have improved  Co morbidities that complicate the patient evaluation . Past Medical History:  Diagnosis Date  . Anxiety   . Chronic abdominal pain   . Diabetes mellitus   . GERD (gastroesophageal reflux disease)   . Hiatal hernia   . Hypercholesteremia   . Hypertension       Dispostion: Discharge    Final Clinical Impression(s) / ED Diagnoses Final diagnoses:  Fall, initial encounter  Contusion of left side of back, initial encounter     This chart was dictated using voice recognition software.  Despite best efforts to proofread,  errors can occur which can change the documentation meaning.    Cristie Hem, MD 11/01/21 [phone]

## 2021-11-06 ENCOUNTER — Other Ambulatory Visit (HOSPITAL_COMMUNITY)
Admission: RE | Admit: 2021-11-06 | Discharge: 2021-11-06 | Disposition: A | Discharge status: Home / Self Care | Payer: Medicare Other | Source: Ambulatory Visit | Attending: Nephrology | Admitting: Nephrology

## 2021-11-06 DIAGNOSIS — H02831 Dermatochalasis of right upper eyelid: Secondary | ICD-10-CM | POA: Diagnosis not present

## 2021-11-06 DIAGNOSIS — H01001 Unspecified blepharitis right upper eyelid: Secondary | ICD-10-CM | POA: Diagnosis not present

## 2021-11-06 DIAGNOSIS — E113513 Type 2 diabetes mellitus with proliferative diabetic retinopathy with macular edema, bilateral: Secondary | ICD-10-CM | POA: Diagnosis not present

## 2021-11-06 DIAGNOSIS — N170 Acute kidney failure with tubular necrosis: Secondary | ICD-10-CM | POA: Insufficient documentation

## 2021-11-06 DIAGNOSIS — H02834 Dermatochalasis of left upper eyelid: Secondary | ICD-10-CM | POA: Diagnosis not present

## 2021-11-06 LAB — RENAL FUNCTION PANEL
Albumin: 3.8 g/dL (ref 3.5–5.0)
Anion gap: 9 (ref 5–15)
BUN: 36 mg/dL — ABNORMAL HIGH (ref 8–23)
CO2: 26 mmol/L (ref 22–32)
Calcium: 8.7 mg/dL — ABNORMAL LOW (ref 8.9–10.3)
Chloride: 104 mmol/L (ref 98–111)
Creatinine, Ser: 2.03 mg/dL — ABNORMAL HIGH (ref 0.44–1.00)
GFR, Estimated: 25 mL/min — ABNORMAL LOW (ref >60)
Glucose, Bld: 190 mg/dL — ABNORMAL HIGH (ref 70–99)
Phosphorus: 3.9 mg/dL (ref 2.5–4.6)
Potassium: 4.5 mmol/L (ref 3.5–5.1)
Sodium: 139 mmol/L (ref 135–145)

## 2021-11-07 DIAGNOSIS — E1165 Type 2 diabetes mellitus with hyperglycemia: Secondary | ICD-10-CM | POA: Diagnosis not present

## 2021-11-13 ENCOUNTER — Ambulatory Visit: Payer: Self-pay | Admitting: *Deleted

## 2021-11-13 ENCOUNTER — Encounter: Payer: Self-pay | Admitting: *Deleted

## 2021-11-13 DIAGNOSIS — R808 Other proteinuria: Secondary | ICD-10-CM | POA: Diagnosis not present

## 2021-11-13 DIAGNOSIS — I5032 Chronic diastolic (congestive) heart failure: Secondary | ICD-10-CM | POA: Diagnosis not present

## 2021-11-13 DIAGNOSIS — N170 Acute kidney failure with tubular necrosis: Secondary | ICD-10-CM | POA: Diagnosis not present

## 2021-11-13 DIAGNOSIS — N189 Chronic kidney disease, unspecified: Secondary | ICD-10-CM | POA: Diagnosis not present

## 2021-11-13 DIAGNOSIS — E1122 Type 2 diabetes mellitus with diabetic chronic kidney disease: Secondary | ICD-10-CM | POA: Diagnosis not present

## 2021-11-13 DIAGNOSIS — I129 Hypertensive chronic kidney disease with stage 1 through stage 4 chronic kidney disease, or unspecified chronic kidney disease: Secondary | ICD-10-CM | POA: Diagnosis not present

## 2021-11-13 DIAGNOSIS — E211 Secondary hyperparathyroidism, not elsewhere classified: Secondary | ICD-10-CM | POA: Diagnosis not present

## 2021-11-13 NOTE — Patient Outreach (Signed)
  Care Coordination   Initial Visit Note   11/13/2021 Name: Holly Baldwin MRN: [id number] DOB: 1945/09/25  Holly Baldwin is a 76 y.o. year old female who sees Sharilyn Sites, MD for primary care. I spoke with  Holly Baldwin by phone today.  What matters to the patients health and wellness today?  Report she had a fall last Friday.  She was sweeping the porch and miscalculated how close she was to the edge.  Was seen in the ED, no broken bones, state she remains sore and slow to move around.  Denies needing walker/cane currently.  Denies any urgent concerns, encouraged to contact this care manager with questions.      Goals Addressed               This Visit's Progress     Recover from fall, decrease risk of another fall (pt-stated)        Care Coordination Interventions: Provided written and verbal education re: potential causes of falls and Fall prevention strategies Reviewed medications and discussed potential side effects of medications such as dizziness and frequent urination Advised patient of importance of notifying provider of falls Assessed for falls since last encounter Assessed patients knowledge of fall risk prevention secondary to previously provided education Advised patient to discuss possible need for HHPT with provider Assessed social determinant of health barriers Reviewed upcoming appointment with PCP (scheduled for 9/14)         SDOH assessments and interventions completed:  Yes  SDOH Interventions Today    Flowsheet Row Most Recent Value  SDOH Interventions   Food Insecurity Interventions Intervention Not Indicated  Housing Interventions Intervention Not Indicated  Transportation Interventions Intervention Not Indicated  Utilities Interventions Intervention Not Indicated        Care Coordination Interventions Activated:  Yes  Care Coordination Interventions:  Yes, provided   Follow up plan: Follow up call scheduled for 10/26    Encounter Outcome:   Pt. Visit Completed   Valente David, RN, MSN, [facility] Care Management Coordinator [phone]

## 2021-11-13 NOTE — Patient Instructions (Signed)
Visit Information  Thank you for taking time to visit with me today. Please don't hesitate to contact me if I can be of assistance to you before our next scheduled telephone appointment.  Following are the goals we discussed today:  Discuss possibility of [facility] PT/OT with PCP.  Our next appointment is by telephone on 9/26  Please call the care guide team at [phone] if you need to cancel or reschedule your appointment.   Please call the Suicide and Crisis Lifeline: 988 call the [place] National Suicide Prevention Lifeline: 5800872756 or TTY: 6673830871 TTY (754)026-5174) to talk to a trained counselor call 1-800-273-TALK (toll free, 24 hour hotline) call the [facility]): [phone] call 911 if you are experiencing a Mental Health or [place] or need someone to talk to.  The patient verbalized understanding of instructions, educational materials, and care plan provided today and agreed to receive a mailed copy of patient instructions, educational materials, and care plan.   The patient has been provided with contact information for the care management team and has been advised to call with any health related questions or concerns.   Valente David, RN, MSN, [place] Care Management Care Management Coordinator [phone]

## 2021-11-14 DIAGNOSIS — M549 Dorsalgia, unspecified: Secondary | ICD-10-CM | POA: Diagnosis not present

## 2021-11-22 DIAGNOSIS — M549 Dorsalgia, unspecified: Secondary | ICD-10-CM | POA: Diagnosis not present

## 2021-11-22 DIAGNOSIS — Z9181 History of falling: Secondary | ICD-10-CM | POA: Diagnosis not present

## 2021-11-22 DIAGNOSIS — M160 Bilateral primary osteoarthritis of hip: Secondary | ICD-10-CM | POA: Diagnosis not present

## 2021-11-22 DIAGNOSIS — Z7902 Long term (current) use of antithrombotics/antiplatelets: Secondary | ICD-10-CM | POA: Diagnosis not present

## 2021-11-22 DIAGNOSIS — Z7982 Long term (current) use of aspirin: Secondary | ICD-10-CM | POA: Diagnosis not present

## 2021-11-22 DIAGNOSIS — Z8673 Personal history of transient ischemic attack (TIA), and cerebral infarction without residual deficits: Secondary | ICD-10-CM | POA: Diagnosis not present

## 2021-11-22 DIAGNOSIS — Z7984 Long term (current) use of oral hypoglycemic drugs: Secondary | ICD-10-CM | POA: Diagnosis not present

## 2021-11-22 DIAGNOSIS — Z794 Long term (current) use of insulin: Secondary | ICD-10-CM | POA: Diagnosis not present

## 2021-11-26 ENCOUNTER — Ambulatory Visit: Payer: Self-pay | Admitting: *Deleted

## 2021-11-26 NOTE — Patient Outreach (Signed)
  Care Coordination   Follow Up Visit Note   11/26/2021 Name: Holly Baldwin MRN: [id number] DOB: 1945/04/26  Holly Baldwin is a 76 y.o. year old female who sees Sharilyn Sites, MD for primary care. I spoke with  Holly Baldwin by phone today.  What matters to the patients health and wellness today?  Report still having some discomfort after fall, was provided a "shot" for the pain.  Will follow up with PCP within the next few weeks if still having pain.    Goals Addressed               This Visit's Progress     Recover from fall, decrease risk of another fall (pt-stated)   On track     Care Coordination Interventions: Provided written and verbal education re: potential causes of falls and Fall prevention strategies Reviewed medications and discussed potential side effects of medications such as dizziness and frequent urination Advised patient of importance of notifying provider of falls Assessed for falls since last encounter Assessed patients knowledge of fall risk prevention secondary to previously provided education Advised patient to discuss alternative pain treatment with provider Confirmed PT services has started         SDOH assessments and interventions completed:  No     Care Coordination Interventions Activated:  Yes  Care Coordination Interventions:  Yes, provided   Follow up plan: Follow up call scheduled for 10/20    Encounter Outcome:  Pt. Visit Completed   Valente David, RN, MSN, [place] Care Management Care Management Coordinator [phone]

## 2021-11-26 NOTE — Patient Instructions (Signed)
Visit Information  Thank you for taking time to visit with me today. Please don't hesitate to contact me if I can be of assistance to you before our next scheduled telephone appointment.  Following are the goals we discussed today:  Contact this care coordinator with questions/concerns  Our next appointment is by telephone on 10/20  Please call the care guide team at [phone] if you need to cancel or reschedule your appointment.   Please call the Suicide and Crisis Lifeline: 988 call the [place] National Suicide Prevention Lifeline: 619-886-6330 or TTY: 512-563-6824 TTY (843)468-8823) to talk to a trained counselor call 1-800-273-TALK (toll free, 24 hour hotline) call the [facility]: [phone] call 911 if you are experiencing a Mental Health or [place] or need someone to talk to.  The patient verbalized understanding of instructions, educational materials, and care plan provided today and agreed to receive a mailed copy of patient instructions, educational materials, and care plan.   The patient has been provided with contact information for the care management team and has been advised to call with any health related questions or concerns.   Valente David, RN, MSN, [place] Care Management Care Management Coordinator [phone]

## 2021-11-27 ENCOUNTER — Other Ambulatory Visit (HOSPITAL_COMMUNITY)
Admission: RE | Admit: 2021-11-27 | Discharge: 2021-11-27 | Disposition: A | Discharge status: Home / Self Care | Payer: Medicare Other | Source: Ambulatory Visit | Attending: Nephrology | Admitting: Nephrology

## 2021-11-27 DIAGNOSIS — E211 Secondary hyperparathyroidism, not elsewhere classified: Secondary | ICD-10-CM | POA: Diagnosis not present

## 2021-11-27 DIAGNOSIS — E1122 Type 2 diabetes mellitus with diabetic chronic kidney disease: Secondary | ICD-10-CM | POA: Diagnosis not present

## 2021-11-27 DIAGNOSIS — D631 Anemia in chronic kidney disease: Secondary | ICD-10-CM | POA: Diagnosis not present

## 2021-11-27 DIAGNOSIS — N170 Acute kidney failure with tubular necrosis: Secondary | ICD-10-CM | POA: Insufficient documentation

## 2021-11-27 DIAGNOSIS — I5032 Chronic diastolic (congestive) heart failure: Secondary | ICD-10-CM | POA: Insufficient documentation

## 2021-11-27 DIAGNOSIS — N189 Chronic kidney disease, unspecified: Secondary | ICD-10-CM | POA: Insufficient documentation

## 2021-11-27 DIAGNOSIS — R808 Other proteinuria: Secondary | ICD-10-CM | POA: Diagnosis not present

## 2021-11-27 DIAGNOSIS — I129 Hypertensive chronic kidney disease with stage 1 through stage 4 chronic kidney disease, or unspecified chronic kidney disease: Secondary | ICD-10-CM | POA: Insufficient documentation

## 2021-11-27 LAB — RENAL FUNCTION PANEL
Albumin: 4 g/dL (ref 3.5–5.0)
Anion gap: 10 (ref 5–15)
BUN: 33 mg/dL — ABNORMAL HIGH (ref 8–23)
CO2: 25 mmol/L (ref 22–32)
Calcium: 8.8 mg/dL — ABNORMAL LOW (ref 8.9–10.3)
Chloride: 108 mmol/L (ref 98–111)
Creatinine, Ser: 2.01 mg/dL — ABNORMAL HIGH (ref 0.44–1.00)
GFR, Estimated: 25 mL/min — ABNORMAL LOW (ref >60)
Glucose, Bld: 199 mg/dL — ABNORMAL HIGH (ref 70–99)
Phosphorus: 3.9 mg/dL (ref 2.5–4.6)
Potassium: 5.3 mmol/L — ABNORMAL HIGH (ref 3.5–5.1)
Sodium: 143 mmol/L (ref 135–145)

## 2021-12-02 DIAGNOSIS — E113513 Type 2 diabetes mellitus with proliferative diabetic retinopathy with macular edema, bilateral: Secondary | ICD-10-CM | POA: Diagnosis not present

## 2021-12-02 DIAGNOSIS — H02834 Dermatochalasis of left upper eyelid: Secondary | ICD-10-CM | POA: Diagnosis not present

## 2021-12-02 DIAGNOSIS — H01001 Unspecified blepharitis right upper eyelid: Secondary | ICD-10-CM | POA: Diagnosis not present

## 2021-12-02 DIAGNOSIS — H02831 Dermatochalasis of right upper eyelid: Secondary | ICD-10-CM | POA: Diagnosis not present

## 2021-12-09 DIAGNOSIS — E119 Type 2 diabetes mellitus without complications: Secondary | ICD-10-CM | POA: Diagnosis not present

## 2021-12-10 ENCOUNTER — Encounter (HOSPITAL_COMMUNITY): Payer: Medicare Other

## 2021-12-10 ENCOUNTER — Other Ambulatory Visit (HOSPITAL_COMMUNITY): Payer: Medicare Other

## 2021-12-17 ENCOUNTER — Ambulatory Visit (HOSPITAL_COMMUNITY)
Admission: RE | Admit: 2021-12-17 | Discharge: 2021-12-17 | Disposition: A | Discharge status: Home / Self Care | Payer: Medicare Other | Source: Ambulatory Visit | Attending: Family Medicine | Admitting: Family Medicine

## 2021-12-17 DIAGNOSIS — N6313 Unspecified lump in the right breast, lower outer quadrant: Secondary | ICD-10-CM | POA: Diagnosis not present

## 2021-12-17 DIAGNOSIS — R922 Inconclusive mammogram: Secondary | ICD-10-CM | POA: Diagnosis not present

## 2021-12-17 DIAGNOSIS — N6314 Unspecified lump in the right breast, lower inner quadrant: Secondary | ICD-10-CM | POA: Diagnosis not present

## 2021-12-17 DIAGNOSIS — N6310 Unspecified lump in the right breast, unspecified quadrant: Secondary | ICD-10-CM | POA: Diagnosis not present

## 2021-12-17 DIAGNOSIS — R92321 Mammographic fibroglandular density, right breast: Secondary | ICD-10-CM | POA: Diagnosis not present

## 2021-12-18 ENCOUNTER — Other Ambulatory Visit: Payer: Self-pay | Admitting: Nurse Practitioner

## 2021-12-20 ENCOUNTER — Ambulatory Visit: Payer: Self-pay | Admitting: *Deleted

## 2021-12-20 NOTE — Patient Instructions (Signed)
Visit Information  Thank you for taking time to visit with me today. Please don't hesitate to contact me if I can be of assistance to you before our next scheduled telephone appointment.  Following are the goals we discussed today:  Perform exercises provided by home health.  Call PCP office to schedule follow up appointment  Our next appointment is by telephone on 11/20  Please call the care guide team at [phone] if you need to cancel or reschedule your appointment.   Please call the Suicide and Crisis Lifeline: 988 call the [place] National Suicide Prevention Lifeline: 517-828-0667 or TTY: 939-480-9903 TTY (616)303-5375) to talk to a trained counselor call 1-800-273-TALK (toll free, 24 hour hotline) call the [facility]: 8204643595 call 911 if you are experiencing a Mental Health or [place] or need someone to talk to.  The patient verbalized understanding of instructions, educational materials, and care plan provided today and agreed to receive a mailed copy of patient instructions, educational materials, and care plan.   The patient has been provided with contact information for the care management team and has been advised to call with any health related questions or concerns.   Valente David, RN, MSN, [place] Care Management Care Management Coordinator [phone]

## 2021-12-20 NOTE — Patient Outreach (Signed)
  Care Coordination   Follow Up Visit Note   12/20/2021 Name: SHALAYAH BEAGLEY MRN: [id number] DOB: 11-17-1945  Damien Fusi is a 76 y.o. year old female who sees Sharilyn Sites, MD for primary care. I spoke with  Damien Fusi by phone today.  What matters to the patients health and wellness today?  [facility] came to the home for assessment and initial session, provided home exercises, but has not been back since.  Denies any further falls, pain has decreased.  Denies any urgent concerns, encouraged to contact this care manager with questions.      Goals Addressed               This Visit's Progress     Recover from fall, decrease risk of another fall (pt-stated)   On track     Care Coordination Interventions: Advised patient of importance of notifying provider of falls Assessed for falls since last encounter Assessed patients knowledge of fall risk prevention secondary to previously provided education Advised patient to discuss alternative pain treatment with provider Called PCP office to follow up on status of home health orders, they have closed case due to inability to maintain contact Discussed with member importance of performing exercises provided by PT Encouraged to use DME (cane) for stability, decreasing risk of fall         SDOH assessments and interventions completed:  No     Care Coordination Interventions Activated:  Yes  Care Coordination Interventions:  Yes, provided   Follow up plan: Follow up call scheduled for 11/20    Encounter Outcome:  Pt. Visit Completed   Valente David, RN, MSN, [place] Care Management Care Management Coordinator [phone]

## 2021-12-27 ENCOUNTER — Other Ambulatory Visit (HOSPITAL_COMMUNITY)
Admission: RE | Admit: 2021-12-27 | Discharge: 2021-12-27 | Disposition: A | Discharge status: Home / Self Care | Payer: Medicare Other | Source: Ambulatory Visit | Attending: Nephrology | Admitting: Nephrology

## 2021-12-27 DIAGNOSIS — N170 Acute kidney failure with tubular necrosis: Secondary | ICD-10-CM | POA: Insufficient documentation

## 2021-12-27 LAB — CBC
HCT: 35 % — ABNORMAL LOW (ref 36.0–46.0)
Hemoglobin: 11.4 g/dL — ABNORMAL LOW (ref 12.0–15.0)
MCH: 27.1 pg (ref 26.0–34.0)
MCHC: 32.6 g/dL (ref 30.0–36.0)
MCV: 83.1 fL (ref 80.0–100.0)
Platelets: 280 10*3/uL (ref 150–400)
RBC: 4.21 MIL/uL (ref 3.87–5.11)
RDW: 15.4 % (ref 11.5–15.5)
WBC: 4.6 10*3/uL (ref 4.0–10.5)
nRBC: 0 % (ref 0.0–0.2)

## 2021-12-27 LAB — RENAL FUNCTION PANEL
Albumin: 3.9 g/dL (ref 3.5–5.0)
Anion gap: 10 (ref 5–15)
BUN: 34 mg/dL — ABNORMAL HIGH (ref 8–23)
CO2: 26 mmol/L (ref 22–32)
Calcium: 8.5 mg/dL — ABNORMAL LOW (ref 8.9–10.3)
Chloride: 103 mmol/L (ref 98–111)
Creatinine, Ser: 2.05 mg/dL — ABNORMAL HIGH (ref 0.44–1.00)
GFR, Estimated: 25 mL/min — ABNORMAL LOW (ref >60)
Glucose, Bld: 184 mg/dL — ABNORMAL HIGH (ref 70–99)
Phosphorus: 4.6 mg/dL (ref 2.5–4.6)
Potassium: 4 mmol/L (ref 3.5–5.1)
Sodium: 139 mmol/L (ref 135–145)

## 2021-12-27 LAB — PROTEIN / CREATININE RATIO, URINE
Creatinine, Urine: 69.72 mg/dL
Protein Creatinine Ratio: 0.66 mg/mg{Cre} — ABNORMAL HIGH (ref 0.00–0.15)
Total Protein, Urine: 46 mg/dL

## 2022-01-02 DIAGNOSIS — Z23 Encounter for immunization: Secondary | ICD-10-CM | POA: Diagnosis not present

## 2022-01-09 DIAGNOSIS — E119 Type 2 diabetes mellitus without complications: Secondary | ICD-10-CM | POA: Diagnosis not present

## 2022-01-14 ENCOUNTER — Ambulatory Visit: Payer: Medicare Other | Admitting: Nurse Practitioner

## 2022-01-15 ENCOUNTER — Other Ambulatory Visit (HOSPITAL_COMMUNITY)
Admission: RE | Admit: 2022-01-15 | Discharge: 2022-01-15 | Disposition: A | Discharge status: Home / Self Care | Payer: Medicare Other | Source: Ambulatory Visit | Attending: Nurse Practitioner | Admitting: Nurse Practitioner

## 2022-01-15 ENCOUNTER — Ambulatory Visit: Payer: Medicare Other | Admitting: Nurse Practitioner

## 2022-01-15 DIAGNOSIS — E039 Hypothyroidism, unspecified: Secondary | ICD-10-CM | POA: Diagnosis not present

## 2022-01-15 LAB — TSH: TSH: 0.159 u[IU]/mL — ABNORMAL LOW (ref 0.350–4.500)

## 2022-01-15 LAB — T4, FREE: Free T4: 1.02 ng/dL (ref 0.61–1.12)

## 2022-01-20 ENCOUNTER — Encounter: Payer: Self-pay | Admitting: *Deleted

## 2022-01-20 ENCOUNTER — Ambulatory Visit: Payer: Self-pay | Admitting: *Deleted

## 2022-01-20 NOTE — Patient Outreach (Signed)
  Care Coordination   Follow Up Visit Note   01/20/2022 Name: Holly Baldwin MRN: [id number] DOB: 11-27-1945  Holly Baldwin is a 76 y.o. year old female who sees Sharilyn Sites, MD for primary care. I spoke with  Holly Baldwin by phone today.  What matters to the patients health and wellness today?  Managing blood sugar    Goals Addressed               This Visit's Progress     Patient Stated     COMPLETED: Recover from fall, decrease risk of another fall (pt-stated)        Care Coordination Interventions: Advised patient of importance of notifying provider of falls Assessed for falls since last encounter Assessed patients knowledge of fall risk prevention secondary to previously provided education Advised patient to discuss alternative pain treatment with provider Called PCP office to follow up on status of home health orders, they have closed case due to inability to maintain contact Discussed with member importance of performing exercises provided by PT Encouraged to use DME (cane) for stability, decreasing risk of fall       Other     Blood Sugar Management        Care Coordination Interventions: Provided education to patient about basic DM disease process Reviewed medications with patient and discussed importance of medication adherence Counseled on importance of regular laboratory monitoring as prescribed Discussed plans with patient for ongoing care management follow up and provided patient with direct contact information for care management team Reviewed scheduled/upcoming provider appointments including: Dr Hilma Favors 02/03/22 Advised patient, providing education and rationale, to check cbg 4 times a day and PRN using CGM and record, calling PCP for findings outside established parameters Review of patient status, including review of consultants reports, relevant laboratory and other test results, and medications completed Assessed social determinant of health  barriers Discussed home blood sugar readings No readings below 70. Averages several above 200 in a week, otherwise stable and WNL Discussed s/s of hypo and hyperglycemia.Patient is able to tell when blood sugar drops. Also freestyle Elenor Legato gives her an alert. Therapeutic listening utilized regarding husband being on Dialysis 3 times a week Reviewed diet. Patient is following a low sodium/heart healthy diet and limiting simple carbs Discussed dietary management during the holiday season Provided with [facility] telephone number and encouraged to reach out as needed        SDOH assessments and interventions completed:  Yes  SDOH Interventions Today    Flowsheet Row Most Recent Value  SDOH Interventions   Housing Interventions Intervention Not Indicated  Transportation Interventions Intervention Not Indicated  Financial Strain Interventions Intervention Not Indicated        Care Coordination Interventions Activated:  Yes  Care Coordination Interventions:  Yes, provided   Follow up plan: Follow up call scheduled for 02/19/22    Encounter Outcome:  Pt. Visit Completed   Chong Sicilian, BSN, RN-BC RN Care Coordinator [place]: [phone] Main #: [phone]

## 2022-01-27 DIAGNOSIS — E113512 Type 2 diabetes mellitus with proliferative diabetic retinopathy with macular edema, left eye: Secondary | ICD-10-CM | POA: Diagnosis not present

## 2022-02-03 ENCOUNTER — Encounter: Payer: Self-pay | Admitting: Nurse Practitioner

## 2022-02-03 ENCOUNTER — Ambulatory Visit (INDEPENDENT_AMBULATORY_CARE_PROVIDER_SITE_OTHER): Payer: Medicare Other | Admitting: Nurse Practitioner

## 2022-02-03 VITALS — BP 143/69 | HR 67 | Ht 64.0 in | Wt 203.6 lb

## 2022-02-03 DIAGNOSIS — I10 Essential (primary) hypertension: Secondary | ICD-10-CM

## 2022-02-03 DIAGNOSIS — E1122 Type 2 diabetes mellitus with diabetic chronic kidney disease: Secondary | ICD-10-CM | POA: Diagnosis not present

## 2022-02-03 DIAGNOSIS — E782 Mixed hyperlipidemia: Secondary | ICD-10-CM | POA: Diagnosis not present

## 2022-02-03 DIAGNOSIS — E039 Hypothyroidism, unspecified: Secondary | ICD-10-CM | POA: Diagnosis not present

## 2022-02-03 DIAGNOSIS — N184 Chronic kidney disease, stage 4 (severe): Secondary | ICD-10-CM | POA: Diagnosis not present

## 2022-02-03 DIAGNOSIS — Z794 Long term (current) use of insulin: Secondary | ICD-10-CM | POA: Diagnosis not present

## 2022-02-03 LAB — POCT GLYCOSYLATED HEMOGLOBIN (HGB A1C): Hemoglobin A1C: 7.5 % — AB (ref 4.0–5.6)

## 2022-02-03 MED ORDER — INSULIN LISPRO (1 UNIT DIAL) 100 UNIT/ML (KWIKPEN): 5.0000 [IU] | PEN_INJECTOR | Freq: Three times a day (TID) | SUBCUTANEOUS | 0 refills | Status: DC

## 2022-02-03 MED ORDER — LANTUS SOLOSTAR 100 UNIT/ML ~~LOC~~ SOPN: 20.0000 [IU] | PEN_INJECTOR | Freq: Every day | SUBCUTANEOUS | 2 refills | Status: DC

## 2022-02-03 NOTE — Progress Notes (Signed)
02/03/2022      Endocrinology follow-up note   Subjective:    Patient ID: Holly Baldwin, female    DOB: 06/28/1945,    Past Medical History:  Diagnosis Date   Anxiety    Chronic abdominal pain    Diabetes mellitus    GERD (gastroesophageal reflux disease)    Hiatal hernia    Hypercholesteremia    Hypertension    Past Surgical History:  Procedure Laterality Date   ABDOMINAL HYSTERECTOMY     CATARACT EXTRACTION W/PHACO Right 01/09/2020   Procedure: CATARACT EXTRACTION PHACO AND INTRAOCULAR LENS PLACEMENT ([place]);  Surgeon: Baruch Goldmann, MD;  Location: [facility];  Service: Ophthalmology;  Laterality: Right;  CDE: 12.01   CATARACT EXTRACTION W/PHACO Left 01/23/2020   Procedure: CATARACT EXTRACTION PHACO AND INTRAOCULAR LENS PLACEMENT LEFT EYE;  Surgeon: Baruch Goldmann, MD;  Location: [facility];  Service: Ophthalmology;  Laterality: Left;  CDE 8.71   COLONOSCOPY N/A 06/23/2012   Procedure: COLONOSCOPY;  Surgeon: Rogene Houston, MD;  Location: [facility];  Service: Endoscopy;  Laterality: N/A;  100-moved to 1200 Ann to notify pt   ESOPHAGOGASTRODUODENOSCOPY N/A 05/06/2012   Procedure: ESOPHAGOGASTRODUODENOSCOPY (EGD);  Surgeon: Rogene Houston, MD;  Location: [facility];  Service: Endoscopy;  Laterality: N/A;   MASS EXCISION Right 05/26/2012   Procedure: EXCISION NEOPLASM RIGHT THIGH;  Surgeon: Jamesetta So, MD;  Location: [facility];  Service: General;  Laterality: Right;   Social History   Socioeconomic History   Marital status: Married    Spouse name: Not on file   Number of children: Not on file   Years of education: Not on file   Highest education level: Not on file  Occupational History   Not on file  Tobacco Use   Smoking status: Never   Smokeless tobacco: Never  Vaping Use   Vaping Use: Never used  Substance and Sexual Activity   Alcohol use: No   Drug use: No   Sexual activity: Not Currently    Birth control/protection: Post-menopausal  Other Topics Concern   Not  on file  Social History Narrative   Not on file   Social Determinants of Health   Financial Resource Strain: Low Risk  (01/20/2022)   Overall Financial Resource Strain (CARDIA)    Difficulty of Paying Living Expenses: Not very hard  Food Insecurity: No Food Insecurity (11/13/2021)   Hunger Vital Sign    Worried About Running Out of Food in the Last Year: Never true    Ran Out of Food in the Last Year: Never true  Transportation Needs: No Transportation Needs (01/20/2022)   PRAPARE - Hydrologist (Medical): No    Lack of Transportation (Non-Medical): No  Physical Activity: Inactive (05/07/2020)   Exercise Vital Sign    Days of Exercise per Week: 0 days    Minutes of Exercise per Session: 0 min  Stress: No Stress Concern Present (05/07/2020)   [place]    Feeling of Stress : Not at all  Social Connections: Socially Isolated (05/07/2020)   Social Connection and Isolation Panel [NHANES]    Frequency of Communication with Friends and Family: Once a week    Frequency of Social Gatherings with Friends and Family: Never    Attends Religious Services: Never    Marine scientist or Organizations: No    Attends Archivist Meetings: Never    Marital Status: Married  Outpatient Encounter Medications as of 02/03/2022  Medication Sig   albuterol (PROVENTIL HFA;VENTOLIN HFA) 108 (90 Base) MCG/ACT inhaler Inhale 1-2 puffs into the lungs every 6 (six) hours as needed for wheezing or shortness of breath.    calcitRIOL (ROCALTROL) 0.25 MCG capsule Take 0.25 mcg by mouth every Monday, Wednesday, and Friday.   dapagliflozin propanediol (FARXIGA) 5 MG TABS tablet Take by mouth daily.   escitalopram (LEXAPRO) 10 MG tablet Take 10 mg by mouth daily.   Finerenone (KERENDIA) 10 MG TABS Take by mouth daily.   hydrALAZINE (APRESOLINE) 50 MG tablet Take 50 mg by mouth 2 (two) times daily.   Insulin Pen  Needle (PEN NEEDLES) 31G X 8 MM MISC Use to take insulin 4 times daily   isosorbide mononitrate (IMDUR) 60 MG 24 hr tablet Take 1 tablet (60 mg total) by mouth daily.   levothyroxine (SYNTHROID) 137 MCG tablet TAKE 1 TABLET (137 MCG TOTAL) BY MOUTH DAILY BEFORE BREAKFAST.   Magnesium 400 MG TABS Take by mouth.   NIFEdipine (PROCARDIA XL/NIFEDICAL-XL) 90 MG 24 hr tablet Take 90 mg by mouth daily.   olmesartan (BENICAR) 40 MG tablet Take 40 mg by mouth daily.   oxyCODONE (ROXICODONE) 5 MG immediate release tablet Take 0.5 tablets (2.5 mg total) by mouth every 6 (six) hours as needed.   RESTASIS 0.05 % ophthalmic emulsion 1 drop 2 (two) times daily.   simvastatin (ZOCOR) 40 MG tablet TAKE 1 TABLET EVERY DAY (Patient taking differently: Take 40 mg by mouth daily.)   [DISCONTINUED] insulin lispro (HUMALOG) 100 UNIT/ML KwikPen INJECT UP TO 10-16 UNITS 3 TIMES DAILY BEFORE MEALS.   furosemide (LASIX) 20 MG tablet Take 20 mg by mouth 2 (two) times daily. (Patient not taking: Reported on 02/03/2022)   insulin glargine (LANTUS SOLOSTAR) 100 UNIT/ML Solostar Pen Inject 20 Units into the skin at bedtime.   insulin lispro (HUMALOG) 100 UNIT/ML KwikPen Inject 5-11 Units into the skin 3 (three) times daily.   potassium chloride SA (KLOR-CON M) 20 MEQ tablet Take 20 mEq by mouth daily. (Patient not taking: Reported on 09/11/2021)   [DISCONTINUED] insulin glargine (LANTUS SOLOSTAR) 100 UNIT/ML Solostar Pen Inject 25 Units into the skin at bedtime. (Patient not taking: Reported on 02/03/2022)   No facility-administered encounter medications on file as of 02/03/2022.   ALLERGIES: Allergies  Allergen Reactions   Bee Venom Anaphylaxis   Penicillins Hives    .Has patient had a PCN reaction causing immediate rash, facial/tongue/throat swelling, SOB or lightheadedness with hypotension: Yes Has patient had a PCN reaction causing severe rash involving mucus membranes or skin necrosis: No Has patient had a PCN reaction  that required hospitalization: Yes Has patient had a PCN reaction occurring within the last 10 years: No If all of the above answers are "NO", then may proceed with Cephalosporin use.  .Has patient had a PCN reaction causing immediate rash, facial/tongue/throat swelling, SOB or lightheadedness with hypotension: Yes Has patient had a PCN reaction causing severe rash involving mucus membranes or skin necrosis: No Has patient had a PCN reaction that required hospitalization: Yes Has patient had a PCN reaction occurring within the last 10 years: No If all of the above answers are "NO", then may proceed with Cephalosporin use. .Has patient had a PCN reaction causing immediate rash, facial/tongue/throat swelling, SOB or lightheadedness with hypotension: Yes Has patient had a PCN reaction causing severe rash involving mucus membranes or skin necrosis: No Has patient had a PCN reaction that required hospitalization: Yes  Has patient had a PCN reaction occurring within the last 10 years: No If all of the above answers are "NO", then may proceed with Cephalosporin use.   VACCINATION STATUS:  There is no immunization history on file for this patient.  Diabetes She presents for her follow-up diabetic visit. She has type 2 diabetes mellitus. Onset time: she was diagnosed at approximate age of 73 years. Her disease course has been fluctuating. There are no hypoglycemic associated symptoms. Pertinent negatives for hypoglycemia include no confusion, headaches, nervousness/anxiousness, pallor, seizures or tremors. Pertinent negatives for diabetes include no chest pain, no fatigue, no polydipsia, no polyphagia, no polyuria and no weight loss. There are no hypoglycemic complications. Symptoms are stable. Diabetic complications include nephropathy. Risk factors for coronary artery disease include diabetes mellitus, dyslipidemia, obesity, sedentary lifestyle and hypertension. Current diabetic treatment includes  intensive insulin program and oral agent (monotherapy). She is compliant with treatment most of the time. Her weight is decreasing steadily. She is following a generally healthy diet. When asked about meal planning, she reported none. She has not had a previous visit with a dietitian. She never participates in exercise. Her home blood glucose trend is fluctuating dramatically. Her overall blood glucose range is 140-180 mg/dl. (She presents today with her CGM and logs showing dramatically fluctuating glycemic profile.  Her POCT A1c today is 7.5%, essentially unchanged from previous visit.  She reports she stopped taking her Lantus due to drops in glucose but has been taking 10-16 units of Novolog at each meal causing her to drop following meals.  Analysis of her CGM shows TIR 68%, TAR 32%, TBR 0% with a GMI of 7.3%.) An ACE inhibitor/angiotensin II receptor blocker is being taken. She does not see a podiatrist.Eye exam is current.  Thyroid Problem Presents for follow-up (She is currently on levothyroxine 137 mcg p.o. nightly.  She reports compliance with medication.) visit. Patient reports no anxiety, cold intolerance, constipation, depressed mood, diarrhea, fatigue, heat intolerance, palpitations, tremors, weight gain or weight loss. The symptoms have been stable. Past treatments include levothyroxine. Her past medical history is significant for diabetes and hyperlipidemia.  Hyperlipidemia This is a chronic problem. The current episode started more than 1 year ago. The problem is controlled. Recent lipid tests were reviewed and are normal. Exacerbating diseases include chronic renal disease, diabetes and obesity. Factors aggravating her hyperlipidemia include beta blockers and fatty foods. Pertinent negatives include no chest pain, myalgias or shortness of breath. Current antihyperlipidemic treatment includes statins. The current treatment provides mild improvement of lipids. Compliance problems include  adherence to diet and adherence to exercise.  Risk factors for coronary artery disease include dyslipidemia, diabetes mellitus, hypertension, obesity, a sedentary lifestyle and post-menopausal.  Hypertension This is a chronic problem. The current episode started more than 1 year ago. The problem has been resolved since onset. The problem is controlled. Pertinent negatives include no chest pain, headaches, palpitations or shortness of breath. Agents associated with hypertension include thyroid hormones. Risk factors for coronary artery disease include dyslipidemia, diabetes mellitus, obesity, post-menopausal state and sedentary lifestyle. Past treatments include calcium channel blockers, central alpha agonists and diuretics. The current treatment provides mild improvement. Compliance problems include exercise and diet.  Hypertensive end-organ damage includes kidney disease. Identifiable causes of hypertension include chronic renal disease and a thyroid problem.     Review of systems  Constitutional: + stable body weight,  current Body mass index is 34.95 kg/m. , no fatigue, no subjective hyperthermia, no subjective hypothermia Eyes: no blurry  vision, no xerophthalmia ENT: no sore throat, no nodules palpated in throat, no dysphagia/odynophagia, no hoarseness Cardiovascular: no chest pain, no shortness of breath, no palpitations, no leg swelling Respiratory: no cough, no shortness of breath Gastrointestinal: no nausea/vomiting/diarrhea Musculoskeletal: no muscle/joint aches Skin: no rashes, no hyperemia Neurological: no tremors, no numbness, no tingling, no dizziness Psychiatric: no depression, no anxiety   Objective:    BP (!) 143/69 (BP Location: Right Arm, Patient Position: Sitting, Cuff Size: Large)   Pulse 67   Ht '5\' 4"'$  (1.626 m)   Wt 203 lb 9.6 oz (92.4 kg)   BMI 34.95 kg/m   Wt Readings from Last 3 Encounters:  02/03/22 203 lb 9.6 oz (92.4 kg)  09/11/21 201 lb (91.2 kg)  05/13/21  203 lb 6.4 oz (92.3 kg)    BP Readings from Last 3 Encounters:  02/03/22 (!) 143/69  11/01/21 (!) 161/65  09/11/21 (!) 145/70     Physical Exam- Limited  Constitutional:  Body mass index is 34.95 kg/m. , not in acute distress, ? Memory impairment Eyes:  EOMI, no exophthalmos Musculoskeletal: no gross deformities, strength intact in all four extremities, no gross restriction of joint movements Skin:  no rashes, no hyperemia Neurological: no tremor with outstretched hands  Diabetic Foot Exam - Simple   No data filed     Results for orders placed or performed in visit on 02/03/22  HgB A1c  Result Value Ref Range   Hemoglobin A1C 7.5 (A) 4.0 - 5.6 %   HbA1c POC (<> result, manual entry)     HbA1c, POC (prediabetic range)     HbA1c, POC (controlled diabetic range)     Diabetic Labs (most recent): Lab Results  Component Value Date   HGBA1C 7.5 (A) 02/03/2022   HGBA1C 7.4 09/11/2021   HGBA1C 7.0 05/13/2021   MICROALBUR 172.4 12/14/2017   MICROALBUR >600.0 03/09/2017   MICROALBUR 10.7 01/23/2016   Lipid Panel     Component Value Date/Time   CHOL 156 05/01/2021 1013   CHOL 184 05/09/2020 0932   TRIG 132 05/01/2021 1013   HDL 46 05/01/2021 1013   HDL 42 05/09/2020 0932   CHOLHDL 3.4 05/01/2021 1013   VLDL 26 05/01/2021 1013   LDLCALC 84 05/01/2021 1013   LDLCALC 108 (H) 05/09/2020 0932   LDLCALC 105 (H) 12/14/2017 0921     Assessment & Plan:   1) Controlled type 2 diabetes mellitus with complication  Her diabetes is complicated by CAD, neuropathy, microalbuminuria, worsening CKD,  and patient remains at a high risk for more acute and chronic complications of diabetes which include CAD, CVA, CKD, retinopathy, and neuropathy. These are all discussed in detail with the patient.  She presents today with her CGM and logs showing dramatically fluctuating glycemic profile.  Her POCT A1c today is 7.5%, essentially unchanged from previous visit.  She reports she stopped  taking her Lantus due to drops in glucose but has been taking 10-16 units of Novolog at each meal causing her to drop following meals.  Analysis of her CGM shows TIR 68%, TAR 32%, TBR 0% with a GMI of 7.3%.  - Nutritional counseling repeated at each appointment due to patients tendency to fall back in to old habits.  - The patient admits there is a room for improvement in their diet and drink choices. -  Suggestion is made for the patient to avoid simple carbohydrates from their diet including Cakes, Sweet Desserts / Pastries, Ice Cream, Soda (diet and regular), Sweet  Tea, Candies, Chips, Cookies, Sweet Pastries, Store Bought Juices, Alcohol in Excess of 1-2 drinks a day, Artificial Sweeteners, Coffee Creamer, and "Sugar-free" Products. This will help patient to have stable blood glucose profile and potentially avoid unintended weight gain.   - I encouraged the patient to switch to unprocessed or minimally processed complex starch and increased protein intake (animal or plant source), fruits, and vegetables.   - Patient is advised to stick to a routine mealtimes to eat 3 meals a day and avoid unnecessary snacks (to snack only to correct hypoglycemia).  - I have approached patient with the following individualized plan to manage diabetes and patient agrees.  She will continue to require intensive treatment with basal/bolus insulin in order for her to achieve and maintain control of diabetes to target.    -For safety purposes, Glipizide should be avoided. With her renal impairment and advanced age, Glipizide increases her risk of hypoglycemia significantly.  Every attempt will be made to decrease her prandial insulin due to potential memory impairment which makes hypoglycemia a significant risk for her.  -She is advised to restart her Lantus to 20 units SQ nightly and lower her Novolog to 5-11 units TID with meals if glucose is above 90 and she is eating (Specific instructions on how to titrate  insulin dosage based on glucose readings given to patient in writing).  She can also continue Farxiga 5 mg po daily as recommended by her nephrologist/cardiologist.  -she is encouraged to continue monitoring blood glucose (using her CGM) at least 4 times daily, before meals and at bedtime and call the clinic if she has readings less than 70 or greater than 200 for 3 tests in a row.  - Patient specific target  for A1c; LDL, HDL, Triglycerides, and  Waist Circumference were discussed in detail.  2) BP/HTN:  Her blood pressure is controlled to target.  She is advised to continue Lasix 40 mg po twice daily, Coreg 25 mg po twice daily, Lasix 20 mg po daily, Hydralazine 100 mg po BID,  Spironolactone 25 mg daily, and Benicar 40 mg po daily.  Will defer med changes to nephrologist.  3) Lipids/HPL: Her most recent lipid panel on 05/01/21 shows controlled LDL at 84.  She is advised to continue Atorvastatin 40 mg po daily at bedtime.  Side effects and precautions discussed with her.    4)  Weight/Diet:  Her Body mass index is 34.95 kg/m. She will benefit from continued modest weight loss.  CDE consult in progress, exercise, and carbohydrates information provided.  5) Hypothyroidism: -Her previsit thyroid function tests are consistent with appropriate hormone replacement.  She is advised to continue Levothyroxine 137 mg po daily before breakfast.     - We discussed about the correct intake of her thyroid hormone, on empty stomach at fasting, with water, separated by at least 30 minutes from breakfast and other medications,  and separated by more than 4 hours from calcium, iron, multivitamins, acid reflux medications (PPIs). -Patient is made aware of the fact that thyroid hormone replacement is needed for life, dose to be adjusted by periodic monitoring of thyroid function tests.  6) Chronic Care/Health Maintenance: -Patient is on ACE and Statin medications and encouraged to continue to follow up with  Ophthalmology, Podiatrist at least yearly or according to recommendations, and advised to  stay away from smoking. I have recommended yearly flu vaccine and pneumonia vaccination at least every 5 years; moderate intensity exercise for up to 150 minutes weekly; and  sleep for at least 7 hours a day.  I advised patient to maintain close follow up with her PCP for primary care needs.      I spent 40 minutes in the care of the patient today including review of labs from [place], Lipids, Thyroid Function, Hematology (current and previous including abstractions from other facilities); face-to-face time discussing  her blood glucose readings/logs, discussing hypoglycemia and hyperglycemia episodes and symptoms, medications doses, her options of short and long term treatment based on the latest standards of care / guidelines;  discussion about incorporating lifestyle medicine;  and documenting the encounter. Risk reduction counseling performed per USPSTF guidelines to reduce obesity and cardiovascular risk factors.     Please refer to Patient Instructions for Blood Glucose Monitoring and Insulin/Medications Dosing Guide"  in media tab for additional information. Please  also refer to " Patient Self Inventory" in the Media  tab for reviewed elements of pertinent patient history.  Holly Baldwin participated in the discussions, expressed understanding, and voiced agreement with the above plans.  All questions were answered to her satisfaction. she is encouraged to contact clinic should she have any questions or concerns prior to her return visit.    Follow up plan: Return in about 3 months (around 05/05/2022) for Diabetes F/U with A1c in office, No previsit labs, Bring meter and logs.   Rayetta Pigg, [facility] [address] [place], [place] [address] Phone: [phone] Fax: [phone]  02/03/2022, 12:27 PM

## 2022-02-09 DIAGNOSIS — E119 Type 2 diabetes mellitus without complications: Secondary | ICD-10-CM | POA: Diagnosis not present

## 2022-02-12 DIAGNOSIS — N19 Unspecified kidney failure: Secondary | ICD-10-CM | POA: Diagnosis not present

## 2022-02-12 DIAGNOSIS — N189 Chronic kidney disease, unspecified: Secondary | ICD-10-CM | POA: Diagnosis not present

## 2022-02-12 DIAGNOSIS — R808 Other proteinuria: Secondary | ICD-10-CM | POA: Diagnosis not present

## 2022-02-12 DIAGNOSIS — I129 Hypertensive chronic kidney disease with stage 1 through stage 4 chronic kidney disease, or unspecified chronic kidney disease: Secondary | ICD-10-CM | POA: Diagnosis not present

## 2022-02-12 DIAGNOSIS — I5032 Chronic diastolic (congestive) heart failure: Secondary | ICD-10-CM | POA: Diagnosis not present

## 2022-02-12 DIAGNOSIS — E1122 Type 2 diabetes mellitus with diabetic chronic kidney disease: Secondary | ICD-10-CM | POA: Diagnosis not present

## 2022-02-12 DIAGNOSIS — E211 Secondary hyperparathyroidism, not elsewhere classified: Secondary | ICD-10-CM | POA: Diagnosis not present

## 2022-02-17 ENCOUNTER — Other Ambulatory Visit: Payer: Self-pay | Admitting: Nurse Practitioner

## 2022-02-19 ENCOUNTER — Ambulatory Visit: Payer: Self-pay | Admitting: *Deleted

## 2022-02-19 NOTE — Patient Outreach (Signed)
  Care Coordination   02/19/2022 Name: Holly Baldwin MRN: [id number] DOB: 11-17-1945   Care Coordination Outreach Attempts:  An unsuccessful telephone outreach was attempted for a scheduled appointment today.  Follow Up Plan:  Additional outreach attempts will be made to offer the patient care coordination information and services.   Encounter Outcome:  No Answer   Care Coordination Interventions:  No, not indicated    Chong Sicilian, BSN, RN-BC RN Care Coordinator [place]: [phone] Main #: [phone]

## 2022-03-02 DIAGNOSIS — I10 Essential (primary) hypertension: Secondary | ICD-10-CM | POA: Diagnosis not present

## 2022-03-02 DIAGNOSIS — E1129 Type 2 diabetes mellitus with other diabetic kidney complication: Secondary | ICD-10-CM | POA: Diagnosis not present

## 2022-03-07 ENCOUNTER — Telehealth: Payer: Self-pay | Admitting: *Deleted

## 2022-03-07 NOTE — Progress Notes (Unsigned)
  Care Coordination Note  03/07/2022 Name: Holly Baldwin MRN: [id number] DOB: 1945/08/26  Holly Baldwin is a 77 y.o. year old female who is a primary care patient of Sharilyn Sites, MD and is actively engaged with the care management team. I reached out to Holly Baldwin by phone today to assist with re-scheduling a follow up visit with the RN Case Manager  Follow up plan: Unsuccessful telephone outreach attempt made. [place]  Direct Dial: [phone]

## 2022-03-10 DIAGNOSIS — E119 Type 2 diabetes mellitus without complications: Secondary | ICD-10-CM | POA: Diagnosis not present

## 2022-03-11 NOTE — Progress Notes (Unsigned)
  Care Coordination Note  03/11/2022 Name: ENZA SHONE MRN: [id number] DOB: April 03, 1945  Damien Fusi is a 77 y.o. year old female who is a primary care patient of Sharilyn Sites, MD and is actively engaged with the care management team. I reached out to Damien Fusi by phone today to assist with re-scheduling a follow up visit with the RN Case Manager  Follow up plan: Unsuccessful telephone outreach attempt made.    [place]  Direct Dial: [phone]

## 2022-03-12 NOTE — Progress Notes (Signed)
  Care Coordination Note  03/12/2022 Name: ARILYN BRIERLEY MRN: [id number] DOB: Jul 23, 1945  Damien Fusi is a 77 y.o. year old female who is a primary care patient of Sharilyn Sites, MD and is actively engaged with the care management team. I reached out to Damien Fusi by phone today to assist with re-scheduling a follow up visit with the RN Case Manager  Follow up plan: Telephone appointment with care management team member scheduled for:04/01/22  [place]  Direct Dial: [phone]

## 2022-03-26 DIAGNOSIS — E1129 Type 2 diabetes mellitus with other diabetic kidney complication: Secondary | ICD-10-CM | POA: Diagnosis not present

## 2022-03-26 DIAGNOSIS — Z0001 Encounter for general adult medical examination with abnormal findings: Secondary | ICD-10-CM | POA: Diagnosis not present

## 2022-03-26 DIAGNOSIS — I5032 Chronic diastolic (congestive) heart failure: Secondary | ICD-10-CM | POA: Diagnosis not present

## 2022-03-26 DIAGNOSIS — N184 Chronic kidney disease, stage 4 (severe): Secondary | ICD-10-CM | POA: Diagnosis not present

## 2022-03-26 DIAGNOSIS — I700 Atherosclerosis of aorta: Secondary | ICD-10-CM | POA: Diagnosis not present

## 2022-03-26 DIAGNOSIS — J069 Acute upper respiratory infection, unspecified: Secondary | ICD-10-CM | POA: Diagnosis not present

## 2022-03-26 DIAGNOSIS — E119 Type 2 diabetes mellitus without complications: Secondary | ICD-10-CM | POA: Diagnosis not present

## 2022-04-01 ENCOUNTER — Encounter: Payer: Self-pay | Admitting: *Deleted

## 2022-04-01 ENCOUNTER — Ambulatory Visit: Payer: Self-pay | Admitting: *Deleted

## 2022-04-02 DIAGNOSIS — I10 Essential (primary) hypertension: Secondary | ICD-10-CM | POA: Diagnosis not present

## 2022-04-02 DIAGNOSIS — E1129 Type 2 diabetes mellitus with other diabetic kidney complication: Secondary | ICD-10-CM | POA: Diagnosis not present

## 2022-04-03 NOTE — Patient Outreach (Signed)
  Care Coordination   Follow Up Visit Note   04/01/2022 Name: Holly Baldwin MRN: [id number] DOB: 11-Mar-1945  Holly Baldwin is a 77 y.o. year old female who sees Sharilyn Sites, MD for primary care. I spoke with  Holly Baldwin by phone today.  What matters to the patients health and wellness today?  Manage blood sugar    Goals Addressed             This Visit's Progress    Blood Sugar Management       Care Coordination Interventions: Reviewed medications with patient and discussed importance of medication adherence Discussed plans with patient for ongoing care management follow up and provided patient with direct contact information for care management team Advised patient, providing education and rationale, to check cbg 4 times daily and PRN with CGM and record, calling PCP for findings outside established parameters No readings below 70. Averages several above 200 in a week, otherwise stable and WNL Discussed s/s of hypo and hyperglycemia.Patient is able to tell when blood sugar drops. Also freestyle Elenor Legato gives her an alert. Therapeutic listening utilized regarding husband being on Dialysis 3 times a week Reviewed diet. Patient is following a low sodium/heart healthy diet and limiting simple carbs Discussed dietary management during the holiday season Provided with [facility] telephone number and encouraged to reach out as needed        SDOH assessments and interventions completed:  Yes  SDOH Interventions Today    Flowsheet Row Most Recent Value  SDOH Interventions   Housing Interventions Intervention Not Indicated  Transportation Interventions Intervention Not Indicated  Utilities Interventions Intervention Not Indicated  Financial Strain Interventions Intervention Not Indicated        Care Coordination Interventions:  Yes, provided   Follow up plan: Follow up call scheduled for 04/28/22    Encounter Outcome:  Pt. Visit Completed   Chong Sicilian, BSN, RN-BC RN Care  Coordinator [place]: [phone] Main #: [phone]

## 2022-04-14 DIAGNOSIS — J4540 Moderate persistent asthma, uncomplicated: Secondary | ICD-10-CM | POA: Diagnosis not present

## 2022-04-14 DIAGNOSIS — J309 Allergic rhinitis, unspecified: Secondary | ICD-10-CM | POA: Diagnosis not present

## 2022-04-21 ENCOUNTER — Other Ambulatory Visit (HOSPITAL_COMMUNITY)
Admission: RE | Admit: 2022-04-21 | Discharge: 2022-04-21 | Disposition: A | Discharge status: Home / Self Care | Payer: 59 | Source: Ambulatory Visit | Attending: Nephrology | Admitting: Nephrology

## 2022-04-21 DIAGNOSIS — E1122 Type 2 diabetes mellitus with diabetic chronic kidney disease: Secondary | ICD-10-CM | POA: Insufficient documentation

## 2022-04-21 DIAGNOSIS — I130 Hypertensive heart and chronic kidney disease with heart failure and stage 1 through stage 4 chronic kidney disease, or unspecified chronic kidney disease: Secondary | ICD-10-CM | POA: Diagnosis not present

## 2022-04-21 DIAGNOSIS — I5032 Chronic diastolic (congestive) heart failure: Secondary | ICD-10-CM | POA: Diagnosis not present

## 2022-04-21 DIAGNOSIS — E211 Secondary hyperparathyroidism, not elsewhere classified: Secondary | ICD-10-CM | POA: Insufficient documentation

## 2022-04-21 DIAGNOSIS — N189 Chronic kidney disease, unspecified: Secondary | ICD-10-CM | POA: Insufficient documentation

## 2022-04-21 DIAGNOSIS — R808 Other proteinuria: Secondary | ICD-10-CM | POA: Insufficient documentation

## 2022-04-21 DIAGNOSIS — N19 Unspecified kidney failure: Secondary | ICD-10-CM | POA: Diagnosis not present

## 2022-04-21 LAB — RENAL FUNCTION PANEL
Albumin: 4 g/dL (ref 3.5–5.0)
Anion gap: 13 (ref 5–15)
BUN: 49 mg/dL — ABNORMAL HIGH (ref 8–23)
CO2: 26 mmol/L (ref 22–32)
Calcium: 8.7 mg/dL — ABNORMAL LOW (ref 8.9–10.3)
Chloride: 98 mmol/L (ref 98–111)
Creatinine, Ser: 2.45 mg/dL — ABNORMAL HIGH (ref 0.44–1.00)
GFR, Estimated: 20 mL/min — ABNORMAL LOW (ref >60)
Glucose, Bld: 186 mg/dL — ABNORMAL HIGH (ref 70–99)
Phosphorus: 5.3 mg/dL — ABNORMAL HIGH (ref 2.5–4.6)
Potassium: 4.1 mmol/L (ref 3.5–5.1)
Sodium: 137 mmol/L (ref 135–145)

## 2022-04-21 LAB — CBC
HCT: 38.2 % (ref 36.0–46.0)
Hemoglobin: 12.1 g/dL (ref 12.0–15.0)
MCH: 26.4 pg (ref 26.0–34.0)
MCHC: 31.7 g/dL (ref 30.0–36.0)
MCV: 83.2 fL (ref 80.0–100.0)
Platelets: 309 10*3/uL (ref 150–400)
RBC: 4.59 MIL/uL (ref 3.87–5.11)
RDW: 15 % (ref 11.5–15.5)
WBC: 4.9 10*3/uL (ref 4.0–10.5)
nRBC: 0 % (ref 0.0–0.2)

## 2022-04-21 LAB — PROTEIN / CREATININE RATIO, URINE
Creatinine, Urine: 79 mg/dL
Protein Creatinine Ratio: 0.28 mg/mg{Cre} — ABNORMAL HIGH (ref 0.00–0.15)
Total Protein, Urine: 22 mg/dL

## 2022-04-23 LAB — PTH, INTACT AND CALCIUM
Calcium, Total (PTH): 9.3 mg/dL (ref 8.7–10.3)
PTH: 136 pg/mL — ABNORMAL HIGH (ref 15–65)

## 2022-04-23 LAB — MISC LABCORP TEST (SEND OUT): Labcorp test code: 81950

## 2022-04-25 DIAGNOSIS — N19 Unspecified kidney failure: Secondary | ICD-10-CM | POA: Diagnosis not present

## 2022-04-25 DIAGNOSIS — R808 Other proteinuria: Secondary | ICD-10-CM | POA: Diagnosis not present

## 2022-04-25 DIAGNOSIS — E211 Secondary hyperparathyroidism, not elsewhere classified: Secondary | ICD-10-CM | POA: Diagnosis not present

## 2022-04-25 DIAGNOSIS — I129 Hypertensive chronic kidney disease with stage 1 through stage 4 chronic kidney disease, or unspecified chronic kidney disease: Secondary | ICD-10-CM | POA: Diagnosis not present

## 2022-04-25 DIAGNOSIS — N189 Chronic kidney disease, unspecified: Secondary | ICD-10-CM | POA: Diagnosis not present

## 2022-04-25 DIAGNOSIS — I5032 Chronic diastolic (congestive) heart failure: Secondary | ICD-10-CM | POA: Diagnosis not present

## 2022-04-25 DIAGNOSIS — E1122 Type 2 diabetes mellitus with diabetic chronic kidney disease: Secondary | ICD-10-CM | POA: Diagnosis not present

## 2022-04-28 ENCOUNTER — Encounter: Payer: Self-pay | Admitting: *Deleted

## 2022-04-28 ENCOUNTER — Ambulatory Visit: Payer: Self-pay | Admitting: *Deleted

## 2022-04-28 NOTE — Patient Outreach (Signed)
  Care Coordination   Follow Up Visit Note   04/28/2022 Name: KATHARIN FULOP MRN: [id number] DOB: 1945-04-12  Holly Baldwin is a 77 y.o. year old female who sees Sharilyn Sites, MD for primary care. I spoke with  Holly Baldwin by phone today.  What matters to the patients health and wellness today?  Questions about how to manage phosphorous in foods, managing blood sugar, and following up on PTH levels    Goals Addressed             This Visit's Progress    Blood Sugar Management   On track    Care Coordination Interventions: Patient will continue medications a prescribed Patient will keep appointment with endocrinologist on 05/05/22 Patient will monitor blood sugar 4 times a day and as needed with continuous glucose monitor  Patient will call endocrinologist if she has 2 consecutive readings >200 or any readings below 70 Patient will continue to follow a low sodium/heart healthy and carb modified diet     Patient will Understand and Follow a Diet Low in High Phosphorous Foods       Care Coordination Goal: Patient will review educational materials on high phosphorous foods to avoid, provided my [facility] Patient will state understanding of education materials and will attempt to follow recommendations to decrease phosphorous in diet Patient will keep all f/u appts with nephrologist Patient will reach out to nephrologist with any questions or concerns regarding diet Patient will reach out to [place] with any care coordination or resource needs      Proper Follow-up on Elevated Intact PTH Levels       Care Coordination Goals: Patient will keep appt with Rayetta Pigg, NP (endocrinology) on 05/05/22 Patient will talk with Whitney about plan for monitoring elevated PTH levels        SDOH assessments and interventions completed:  Yes  SDOH Interventions Today    Flowsheet Row Most Recent Value  SDOH Interventions   Transportation Interventions Intervention Not Indicated   Financial Strain Interventions Intervention Not Indicated        Care Coordination Interventions:  Yes, provided  Interventions Today    Flowsheet Row Most Recent Value  Chronic Disease   Chronic disease during today's visit Diabetes, Chronic Kidney Disease/End Stage Renal Disease (ESRD), Other  [Elevated Intact PTH and elevated Phosphorous levels]  General Interventions   General Interventions Discussed/Reviewed General Interventions Discussed, General Interventions Reviewed, Labs, Durable Medical Equipment (DME), Doctor Visits, Communication with  Labs Hgb A1c every 3 months, Kidney Function  Doctor Visits Discussed/Reviewed Specialist, Doctor Visits Discussed, Doctor Visits Reviewed  Jolinda Croak and discussed nephrology visit and recommendations]  Durable Medical Equipment (DME) Other  [CGM]  PCP/Specialist Visits Compliance with follow-up visit  Communication with PCP/Specialists  Rayetta Pigg, NP (endocrinologist) re: persistently elevated PTH and follow-up plan]  Education Interventions   Education Provided Provided Printed Education, Provided Education  Provided Verbal Education On Nutrition, When to see the doctor  [printed educational materials on high phosphorous foods to avoid]  Nutrition Interventions   Nutrition Discussed/Reviewed Nutrition Discussed, Nutrition Reviewed  Pharmacy Interventions   Pharmacy Dicussed/Reviewed Medications and their functions       Follow up plan: Follow up call scheduled for 05/29/22    Encounter Outcome:  Pt. Visit Completed   Chong Sicilian, BSN, RN-BC RN Care Coordinator [place]: [phone] Main #: [phone]

## 2022-05-01 DIAGNOSIS — I10 Essential (primary) hypertension: Secondary | ICD-10-CM | POA: Diagnosis not present

## 2022-05-01 DIAGNOSIS — E1129 Type 2 diabetes mellitus with other diabetic kidney complication: Secondary | ICD-10-CM | POA: Diagnosis not present

## 2022-05-05 ENCOUNTER — Encounter: Payer: Self-pay | Admitting: Nurse Practitioner

## 2022-05-05 ENCOUNTER — Ambulatory Visit (INDEPENDENT_AMBULATORY_CARE_PROVIDER_SITE_OTHER): Payer: 59 | Admitting: Nurse Practitioner

## 2022-05-05 VITALS — BP 130/70 | HR 68 | Ht 64.0 in | Wt 196.0 lb

## 2022-05-05 DIAGNOSIS — Z794 Long term (current) use of insulin: Secondary | ICD-10-CM

## 2022-05-05 DIAGNOSIS — N184 Chronic kidney disease, stage 4 (severe): Secondary | ICD-10-CM | POA: Diagnosis not present

## 2022-05-05 DIAGNOSIS — E1122 Type 2 diabetes mellitus with diabetic chronic kidney disease: Secondary | ICD-10-CM | POA: Diagnosis not present

## 2022-05-05 LAB — POCT GLYCOSYLATED HEMOGLOBIN (HGB A1C): Hemoglobin A1C: 7.1 % — AB (ref 4.0–5.6)

## 2022-05-05 MED ORDER — LANTUS SOLOSTAR 100 UNIT/ML ~~LOC~~ SOPN: 20.0000 [IU] | PEN_INJECTOR | Freq: Every day | SUBCUTANEOUS | 2 refills | Status: DC

## 2022-05-05 MED ORDER — LEVOTHYROXINE SODIUM 137 MCG PO TABS: 137.0000 ug | ORAL_TABLET | Freq: Every day | ORAL | 0 refills | Status: DC

## 2022-05-05 MED ORDER — INSULIN LISPRO (1 UNIT DIAL) 100 UNIT/ML (KWIKPEN): 2.0000 [IU] | PEN_INJECTOR | Freq: Three times a day (TID) | SUBCUTANEOUS | 0 refills | Status: DC

## 2022-05-05 NOTE — Patient Instructions (Signed)

## 2022-05-05 NOTE — Progress Notes (Signed)
05/05/2022      Endocrinology follow-up note   Subjective:    Patient ID: Holly Baldwin, female    DOB: 20-Oct-1945,    Past Medical History:  Diagnosis Date   Anxiety    Chronic abdominal pain    Diabetes mellitus    GERD (gastroesophageal reflux disease)    Hiatal hernia    Hypercholesteremia    Hypertension    Past Surgical History:  Procedure Laterality Date   ABDOMINAL HYSTERECTOMY     CATARACT EXTRACTION W/PHACO Right 01/09/2020   Procedure: CATARACT EXTRACTION PHACO AND INTRAOCULAR LENS PLACEMENT ([place]);  Surgeon: Baruch Goldmann, MD;  Location: [facility];  Service: Ophthalmology;  Laterality: Right;  CDE: 12.01   CATARACT EXTRACTION W/PHACO Left 01/23/2020   Procedure: CATARACT EXTRACTION PHACO AND INTRAOCULAR LENS PLACEMENT LEFT EYE;  Surgeon: Baruch Goldmann, MD;  Location: [facility];  Service: Ophthalmology;  Laterality: Left;  CDE 8.71   COLONOSCOPY N/A 06/23/2012   Procedure: COLONOSCOPY;  Surgeon: Rogene Houston, MD;  Location: [facility];  Service: Endoscopy;  Laterality: N/A;  100-moved to 1200 Ann to notify pt   ESOPHAGOGASTRODUODENOSCOPY N/A 05/06/2012   Procedure: ESOPHAGOGASTRODUODENOSCOPY (EGD);  Surgeon: Rogene Houston, MD;  Location: [facility];  Service: Endoscopy;  Laterality: N/A;   MASS EXCISION Right 05/26/2012   Procedure: EXCISION NEOPLASM RIGHT THIGH;  Surgeon: Jamesetta So, MD;  Location: [facility];  Service: General;  Laterality: Right;   Social History   Socioeconomic History   Marital status: Married    Spouse name: Not on file   Number of children: Not on file   Years of education: Not on file   Highest education level: Not on file  Occupational History   Not on file  Tobacco Use   Smoking status: Never   Smokeless tobacco: Never  Vaping Use   Vaping Use: Never used  Substance and Sexual Activity   Alcohol use: No   Drug use: No   Sexual activity: Not Currently    Birth control/protection: Post-menopausal  Other Topics Concern   Not  on file  Social History Narrative   Not on file   Social Determinants of Health   Financial Resource Strain: Low Risk  (04/28/2022)   Overall Financial Resource Strain (CARDIA)    Difficulty of Paying Living Expenses: Not very hard  Food Insecurity: No Food Insecurity (11/13/2021)   Hunger Vital Sign    Worried About Running Out of Food in the Last Year: Never true    Ran Out of Food in the Last Year: Never true  Transportation Needs: No Transportation Needs (04/28/2022)   PRAPARE - Hydrologist (Medical): No    Lack of Transportation (Non-Medical): No  Physical Activity: Inactive (05/07/2020)   Exercise Vital Sign    Days of Exercise per Week: 0 days    Minutes of Exercise per Session: 0 min  Stress: No Stress Concern Present (05/07/2020)   [place]    Feeling of Stress : Not at all  Social Connections: Socially Isolated (05/07/2020)   Social Connection and Isolation Panel [NHANES]    Frequency of Communication with Friends and Family: Once a week    Frequency of Social Gatherings with Friends and Family: Never    Attends Religious Services: Never    Marine scientist or Organizations: No    Attends Archivist Meetings: Never    Marital Status: Married  Outpatient Encounter Medications as of 05/05/2022  Medication Sig   albuterol (PROVENTIL HFA;VENTOLIN HFA) 108 (90 Base) MCG/ACT inhaler Inhale 1-2 puffs into the lungs every 6 (six) hours as needed for wheezing or shortness of breath.    calcitRIOL (ROCALTROL) 0.25 MCG capsule Take 0.25 mcg by mouth every Monday, Wednesday, and Friday.   dapagliflozin propanediol (FARXIGA) 5 MG TABS tablet Take by mouth daily.   escitalopram (LEXAPRO) 10 MG tablet Take 10 mg by mouth daily.   Finerenone (KERENDIA) 10 MG TABS Take by mouth daily.   furosemide (LASIX) 20 MG tablet Take 20 mg by mouth 2 (two) times daily.   hydrALAZINE  (APRESOLINE) 50 MG tablet Take 50 mg by mouth 2 (two) times daily.   Insulin Pen Needle (PEN NEEDLES) 31G X 8 MM MISC Use to take insulin 4 times daily   isosorbide mononitrate (IMDUR) 60 MG 24 hr tablet Take 1 tablet (60 mg total) by mouth daily.   Magnesium 400 MG TABS Take by mouth.   NIFEdipine (PROCARDIA XL/NIFEDICAL-XL) 90 MG 24 hr tablet Take 90 mg by mouth daily.   olmesartan (BENICAR) 40 MG tablet Take 40 mg by mouth daily.   oxyCODONE (ROXICODONE) 5 MG immediate release tablet Take 0.5 tablets (2.5 mg total) by mouth every 6 (six) hours as needed.   potassium chloride SA (KLOR-CON M) 20 MEQ tablet Take 20 mEq by mouth daily.   RESTASIS 0.05 % ophthalmic emulsion 1 drop 2 (two) times daily.   simvastatin (ZOCOR) 40 MG tablet TAKE 1 TABLET EVERY DAY (Patient taking differently: Take 40 mg by mouth daily.)   [DISCONTINUED] insulin glargine (LANTUS SOLOSTAR) 100 UNIT/ML Solostar Pen Inject 20 Units into the skin at bedtime.   [DISCONTINUED] insulin lispro (HUMALOG) 100 UNIT/ML KwikPen Inject 5-11 Units into the skin 3 (three) times daily.   [DISCONTINUED] levothyroxine (SYNTHROID) 137 MCG tablet TAKE 1 TABLET (137 MCG TOTAL) BY MOUTH DAILY BEFORE BREAKFAST.   insulin glargine (LANTUS SOLOSTAR) 100 UNIT/ML Solostar Pen Inject 20 Units into the skin at bedtime.   insulin lispro (HUMALOG) 100 UNIT/ML KwikPen Inject 2-8 Units into the skin 3 (three) times daily.   levothyroxine (SYNTHROID) 137 MCG tablet Take 1 tablet (137 mcg total) by mouth daily before breakfast.   No facility-administered encounter medications on file as of 05/05/2022.   ALLERGIES: Allergies  Allergen Reactions   Bee Venom Anaphylaxis   Penicillins Hives    .Has patient had a PCN reaction causing immediate rash, facial/tongue/throat swelling, SOB or lightheadedness with hypotension: Yes Has patient had a PCN reaction causing severe rash involving mucus membranes or skin necrosis: No Has patient had a PCN reaction that  required hospitalization: Yes Has patient had a PCN reaction occurring within the last 10 years: No If all of the above answers are "NO", then may proceed with Cephalosporin use.  .Has patient had a PCN reaction causing immediate rash, facial/tongue/throat swelling, SOB or lightheadedness with hypotension: Yes Has patient had a PCN reaction causing severe rash involving mucus membranes or skin necrosis: No Has patient had a PCN reaction that required hospitalization: Yes Has patient had a PCN reaction occurring within the last 10 years: No If all of the above answers are "NO", then may proceed with Cephalosporin use. .Has patient had a PCN reaction causing immediate rash, facial/tongue/throat swelling, SOB or lightheadedness with hypotension: Yes Has patient had a PCN reaction causing severe rash involving mucus membranes or skin necrosis: No Has patient had a PCN reaction that required hospitalization:  Yes Has patient had a PCN reaction occurring within the last 10 years: No If all of the above answers are "NO", then may proceed with Cephalosporin use.   VACCINATION STATUS:  There is no immunization history on file for this patient.  Diabetes She presents for her follow-up diabetic visit. She has type 2 diabetes mellitus. Onset time: she was diagnosed at approximate age of 62 years. Her disease course has been improving. There are no hypoglycemic associated symptoms. Pertinent negatives for hypoglycemia include no confusion, headaches, nervousness/anxiousness, pallor, seizures or tremors. Associated symptoms include weight loss. Pertinent negatives for diabetes include no chest pain, no fatigue, no polydipsia, no polyphagia and no polyuria. There are no hypoglycemic complications. Symptoms are stable. Diabetic complications include nephropathy. Risk factors for coronary artery disease include diabetes mellitus, dyslipidemia, obesity, sedentary lifestyle and hypertension. Current diabetic  treatment includes intensive insulin program and oral agent (monotherapy). She is compliant with treatment most of the time. Her weight is decreasing steadily. She is following a generally healthy diet. When asked about meal planning, she reported none. She has not had a previous visit with a dietitian. She never participates in exercise. Her home blood glucose trend is decreasing steadily. Her overall blood glucose range is 140-180 mg/dl. (She presents today with her CGM and logs showing tightening glycemic profile overall.  Her POCT A1c today is 7.1%, improving from last visit of 7.5%.  She does still have some hypoglycemia noted, likely due to bolusing too much at meals.  Analysis of her CGM shows TIR 79%, TAR 21%, TBR 0% with a GMI of 6.9%.) An ACE inhibitor/angiotensin II receptor blocker is being taken. She does not see a podiatrist.Eye exam is current.  Thyroid Problem Presents for follow-up (She is currently on levothyroxine 137 mcg p.o. nightly.  She reports compliance with medication.) visit. Symptoms include weight loss. Patient reports no anxiety, cold intolerance, constipation, depressed mood, diarrhea, fatigue, heat intolerance, palpitations, tremors or weight gain. The symptoms have been stable. Past treatments include levothyroxine. Her past medical history is significant for diabetes and hyperlipidemia.  Hyperlipidemia This is a chronic problem. The current episode started more than 1 year ago. The problem is controlled. Recent lipid tests were reviewed and are normal. Exacerbating diseases include chronic renal disease, diabetes and obesity. Factors aggravating her hyperlipidemia include beta blockers and fatty foods. Pertinent negatives include no chest pain, myalgias or shortness of breath. Current antihyperlipidemic treatment includes statins. The current treatment provides mild improvement of lipids. Compliance problems include adherence to diet and adherence to exercise.  Risk factors for  coronary artery disease include dyslipidemia, diabetes mellitus, hypertension, obesity, a sedentary lifestyle and post-menopausal.  Hypertension This is a chronic problem. The current episode started more than 1 year ago. The problem has been resolved since onset. The problem is controlled. Pertinent negatives include no chest pain, headaches, palpitations or shortness of breath. Agents associated with hypertension include thyroid hormones. Risk factors for coronary artery disease include dyslipidemia, diabetes mellitus, obesity, post-menopausal state and sedentary lifestyle. Past treatments include calcium channel blockers, central alpha agonists and diuretics. The current treatment provides mild improvement. Compliance problems include exercise and diet.  Hypertensive end-organ damage includes kidney disease. Identifiable causes of hypertension include chronic renal disease and a thyroid problem.     Review of systems  Constitutional: + steadily decreasing body weight,  current Body mass index is 33.64 kg/m. , no fatigue, no subjective hyperthermia, no subjective hypothermia Eyes: no blurry vision, no xerophthalmia ENT: no sore throat,  no nodules palpated in throat, no dysphagia/odynophagia, no hoarseness Cardiovascular: no chest pain, no shortness of breath, no palpitations, no leg swelling Respiratory: no cough, no shortness of breath Gastrointestinal: no nausea/vomiting/diarrhea Musculoskeletal: no muscle/joint aches Skin: no rashes, no hyperemia Neurological: no tremors, no numbness, no tingling, no dizziness Psychiatric: no depression, no anxiety   Objective:    BP 130/70 (BP Location: Left Arm, Patient Position: Sitting, Cuff Size: Large) Comment: Retake with Manuel Cuff  Pulse 68   Ht '5\' 4"'$  (1.626 m)   Wt 196 lb (88.9 kg)   BMI 33.64 kg/m   Wt Readings from Last 3 Encounters:  05/05/22 196 lb (88.9 kg)  02/03/22 203 lb 9.6 oz (92.4 kg)  09/11/21 201 lb (91.2 kg)    BP  Readings from Last 3 Encounters:  05/05/22 130/70  02/03/22 (!) 143/69  11/01/21 (!) 161/65     Physical Exam- Limited  Constitutional:  Body mass index is 33.64 kg/m. , not in acute distress, ? Memory impairment Eyes:  EOMI, no exophthalmos Musculoskeletal: no gross deformities, strength intact in all four extremities, no gross restriction of joint movements Skin:  no rashes, no hyperemia Neurological: no tremor with outstretched hands  Diabetic Foot Exam - Simple   No data filed     Results for orders placed or performed in visit on 05/05/22  HgB A1c  Result Value Ref Range   Hemoglobin A1C 7.1 (A) 4.0 - 5.6 %   HbA1c POC (<> result, manual entry)     HbA1c, POC (prediabetic range)     HbA1c, POC (controlled diabetic range)     Diabetic Labs (most recent): Lab Results  Component Value Date   HGBA1C 7.1 (A) 05/05/2022   HGBA1C 7.5 (A) 02/03/2022   HGBA1C 7.4 09/11/2021   MICROALBUR 172.4 12/14/2017   MICROALBUR >600.0 03/09/2017   MICROALBUR 10.7 01/23/2016   Lipid Panel     Component Value Date/Time   CHOL 156 05/01/2021 1013   CHOL 184 05/09/2020 0932   TRIG 132 05/01/2021 1013   HDL 46 05/01/2021 1013   HDL 42 05/09/2020 0932   CHOLHDL 3.4 05/01/2021 1013   VLDL 26 05/01/2021 1013   LDLCALC 84 05/01/2021 1013   LDLCALC 108 (H) 05/09/2020 0932   LDLCALC 105 (H) 12/14/2017 0921     Assessment & Plan:   1) Controlled type 2 diabetes mellitus with complication  Her diabetes is complicated by CAD, neuropathy, microalbuminuria, worsening CKD,  and patient remains at a high risk for more acute and chronic complications of diabetes which include CAD, CVA, CKD, retinopathy, and neuropathy. These are all discussed in detail with the patient.  She presents today with her CGM and logs showing tightening glycemic profile overall.  Her POCT A1c today is 7.1%, improving from last visit of 7.5%.  She does still have some hypoglycemia noted, likely due to bolusing too  much at meals.  Analysis of her CGM shows TIR 79%, TAR 21%, TBR 0% with a GMI of 6.9%.  - Nutritional counseling repeated at each appointment due to patients tendency to fall back in to old habits.  - The patient admits there is a room for improvement in their diet and drink choices. -  Suggestion is made for the patient to avoid simple carbohydrates from their diet including Cakes, Sweet Desserts / Pastries, Ice Cream, Soda (diet and regular), Sweet Tea, Candies, Chips, Cookies, Sweet Pastries, Store Bought Juices, Alcohol in Excess of 1-2 drinks a day, Artificial Sweeteners, Coffee Creamer,  and "Sugar-free" Products. This will help patient to have stable blood glucose profile and potentially avoid unintended weight gain.   - I encouraged the patient to switch to unprocessed or minimally processed complex starch and increased protein intake (animal or plant source), fruits, and vegetables.   - Patient is advised to stick to a routine mealtimes to eat 3 meals a day and avoid unnecessary snacks (to snack only to correct hypoglycemia).  - I have approached patient with the following individualized plan to manage diabetes and patient agrees.  She will continue to require intensive treatment with basal/bolus insulin in order for her to achieve and maintain control of diabetes to target.    -For safety purposes, Glipizide should be avoided. With her renal impairment and advanced age, Glipizide increases her risk of hypoglycemia significantly.  Every attempt will be made to decrease her prandial insulin due to potential memory impairment which makes hypoglycemia a significant risk for her.  -She is advised to continue her Lantus to 20 units SQ nightly and lower her Novolog to 2-8 units TID with meals if glucose is above 90 and she is eating (Specific instructions on how to titrate insulin dosage based on glucose readings given to patient in writing).  She can also continue Farxiga 5 mg po daily as  recommended by her nephrologist/cardiologist.  -she is encouraged to continue monitoring blood glucose (using her CGM) at least 4 times daily, before meals and at bedtime and call the clinic if she has readings less than 70 or greater than 200 for 3 tests in a row.  - Patient specific target  for A1c; LDL, HDL, Triglycerides, and  Waist Circumference were discussed in detail.  2) BP/HTN:  Her blood pressure is controlled to target.  She is advised to continue Lasix 40 mg po twice daily, Coreg 25 mg po twice daily, Lasix 20 mg po daily, Hydralazine 100 mg po BID,  Spironolactone 25 mg daily, and Benicar 40 mg po daily.  Will defer med changes to nephrologist.  3) Lipids/HPL: Her most recent lipid panel on 05/01/21 shows controlled LDL at 84.  She is advised to continue Atorvastatin 40 mg po daily at bedtime.  Side effects and precautions discussed with her.  Will recheck lipid panel prior to next visit.  4)  Weight/Diet:  Her Body mass index is 33.64 kg/m. She will benefit from continued modest weight loss.  CDE consult in progress, exercise, and carbohydrates information provided.  5) Hypothyroidism: -There are no recent TFTs to review.  She is advised to continue Levothyroxine 137 mg po daily before breakfast.  Will recheck TFTs prior to next visit and adjust dose accordingly.   - We discussed about the correct intake of her thyroid hormone, on empty stomach at fasting, with water, separated by at least 30 minutes from breakfast and other medications,  and separated by more than 4 hours from calcium, iron, multivitamins, acid reflux medications (PPIs). -Patient is made aware of the fact that thyroid hormone replacement is needed for life, dose to be adjusted by periodic monitoring of thyroid function tests.  6) Chronic Care/Health Maintenance: -Patient is on ACE and Statin medications and encouraged to continue to follow up with Ophthalmology, Podiatrist at least yearly or according to  recommendations, and advised to  stay away from smoking. I have recommended yearly flu vaccine and pneumonia vaccination at least every 5 years; moderate intensity exercise for up to 150 minutes weekly; and  sleep for at least 7 hours a  day.  I advised patient to maintain close follow up with her PCP for primary care needs.      I spent  47  minutes in the care of the patient today including review of labs from [place], Lipids, Thyroid Function, Hematology (current and previous including abstractions from other facilities); face-to-face time discussing  her blood glucose readings/logs, discussing hypoglycemia and hyperglycemia episodes and symptoms, medications doses, her options of short and long term treatment based on the latest standards of care / guidelines;  discussion about incorporating lifestyle medicine;  and documenting the encounter. Risk reduction counseling performed per USPSTF guidelines to reduce obesity and cardiovascular risk factors.     Please refer to Patient Instructions for Blood Glucose Monitoring and Insulin/Medications Dosing Guide"  in media tab for additional information. Please  also refer to " Patient Self Inventory" in the Media  tab for reviewed elements of pertinent patient history.  Holly Baldwin participated in the discussions, expressed understanding, and voiced agreement with the above plans.  All questions were answered to her satisfaction. she is encouraged to contact clinic should she have any questions or concerns prior to her return visit.    Follow up plan: Return in about 3 months (around 08/05/2022) for Diabetes F/U with A1c in office, Thyroid follow up, Previsit labs, Bring meter and logs.   Rayetta Pigg, [facility] [address] [place], [place] [address] Phone: [phone] Fax: [phone]  05/05/2022, 11:01 AM

## 2022-05-12 ENCOUNTER — Other Ambulatory Visit (HOSPITAL_COMMUNITY): Payer: Self-pay | Admitting: Family Medicine

## 2022-05-12 DIAGNOSIS — N6310 Unspecified lump in the right breast, unspecified quadrant: Secondary | ICD-10-CM

## 2022-05-16 ENCOUNTER — Other Ambulatory Visit: Payer: Self-pay | Admitting: Nurse Practitioner

## 2022-05-28 ENCOUNTER — Encounter: Payer: 59 | Admitting: *Deleted

## 2022-05-29 ENCOUNTER — Encounter: Payer: Self-pay | Admitting: *Deleted

## 2022-05-29 ENCOUNTER — Ambulatory Visit: Payer: Self-pay | Admitting: *Deleted

## 2022-05-29 NOTE — Patient Outreach (Signed)
Care Coordination   Follow Up Visit Note   05/29/2022 Name: Holly Baldwin MRN: [id number] DOB: 02-23-1946  Holly Baldwin is a 77 y.o. year old female who sees Sharilyn Sites, MD for primary care. I spoke with  Holly Baldwin by phone today.  What matters to the patients health and wellness today?  Managing blood sugar and phosphorous level    Goals Addressed             This Visit's Progress    Blood Sugar Management   On track    Care Coordination Interventions: Patient will continue medications a prescribed Patient will keep appointment with endocrinologist on 08/06/22 and PCP in 07/2022 Patient will monitor blood sugar 4 times a day and as needed with continuous glucose monitor  Patient will call endocrinologist if she has 2 consecutive readings >200 or any readings below 70 Patient will continue to follow a low sodium/heart healthy and carb modified diet     COMPLETED: Patient will Understand and Follow a Diet Low in High Phosphorous Foods       Care Coordination Goal: Patient will review educational materials on high phosphorous foods to avoid, provided my [facility] Patient will state understanding of education materials and will attempt to follow recommendations to decrease phosphorous in diet Patient will keep all f/u appts with nephrologist Patient will reach out to nephrologist with any questions or concerns regarding diet Patient will reach out to [place] with any care coordination or resource needs      COMPLETED: Proper Follow-up on Elevated Intact PTH Levels       Care Coordination Goals: Patient will keep appt with Dr Theador Hawthorne (nephrologist) for 07/02/22 Patient will talk with Dr Theador Hawthorne regarding management and with any questions or concerns        SDOH assessments and interventions completed:  Yes  SDOH Interventions Today    Flowsheet Row Most Recent Value  SDOH Interventions   Housing Interventions Intervention Not Indicated  Transportation Interventions  Intervention Not Indicated        Care Coordination Interventions:  Yes, provided  Interventions Today    Flowsheet Row Most Recent Value  Chronic Disease   Chronic disease during today's visit Diabetes, Other  [Elevated PTH]  General Interventions   General Interventions Discussed/Reviewed General Interventions Discussed, General Interventions Reviewed, Doctor Visits, Labs, Annual Foot Exam, Lipid Profile, Durable Medical Equipment (DME)  Labs Hgb A1c every 3 months, Kidney Function  [Will have lipid panel at PCP visit in 07/2022. Repeat PTH at nephrology f/u]  Doctor Visits Discussed/Reviewed Doctor Visits Discussed, Specialist, Doctor Visits Reviewed, PCP  Durable Medical Equipment (DME) Other  [continuous glucose monitor]  PCP/Specialist Visits Compliance with follow-up visit  Exercise Interventions   Exercise Discussed/Reviewed Physical Activity  Physical Activity Discussed/Reviewed Physical Activity Reviewed  Education Interventions   Education Provided Provided Education  Provided Verbal Education On Nutrition, Labs, When to see the doctor, Blood Sugar Monitoring, Foot Care  [Patient received printed information on high phosphorous foods to avoid]  Labs Reviewed Lipid Profile, Kidney Function, Hgb A1c  Nutrition Interventions   Nutrition Discussed/Reviewed Nutrition Discussed, Carbohydrate meal planning  [low phosphorous diet]  Pharmacy Interventions   Pharmacy Dicussed/Reviewed Medications and their functions  Safety Interventions   Safety Discussed/Reviewed Safety Discussed       Follow up plan: Follow up call scheduled for 07/29/22    Encounter Outcome:  Pt. Visit Completed   Chong Sicilian, BSN, RN-BC [place]  [place]: [phone] Main #: [phone]

## 2022-06-03 ENCOUNTER — Other Ambulatory Visit: Payer: Self-pay

## 2022-06-03 ENCOUNTER — Ambulatory Visit (HOSPITAL_COMMUNITY)
Admission: RE | Admit: 2022-06-03 | Discharge: 2022-06-03 | Disposition: A | Discharge status: Home / Self Care | Payer: 59 | Source: Ambulatory Visit | Attending: Family Medicine | Admitting: Family Medicine

## 2022-06-03 ENCOUNTER — Encounter (HOSPITAL_COMMUNITY): Payer: Self-pay

## 2022-06-03 DIAGNOSIS — R92323 Mammographic fibroglandular density, bilateral breasts: Secondary | ICD-10-CM | POA: Diagnosis not present

## 2022-06-03 DIAGNOSIS — N6310 Unspecified lump in the right breast, unspecified quadrant: Secondary | ICD-10-CM | POA: Diagnosis not present

## 2022-06-03 DIAGNOSIS — Z1239 Encounter for other screening for malignant neoplasm of breast: Secondary | ICD-10-CM | POA: Diagnosis not present

## 2022-06-03 DIAGNOSIS — R928 Other abnormal and inconclusive findings on diagnostic imaging of breast: Secondary | ICD-10-CM | POA: Diagnosis not present

## 2022-06-03 MED ORDER — LEVOTHYROXINE SODIUM 137 MCG PO TABS: 137.0000 ug | ORAL_TABLET | Freq: Every day | ORAL | 0 refills | Status: DC

## 2022-06-03 MED ORDER — INSULIN LISPRO (1 UNIT DIAL) 100 UNIT/ML (KWIKPEN): 2.0000 [IU] | PEN_INJECTOR | Freq: Three times a day (TID) | SUBCUTANEOUS | 0 refills | Status: DC

## 2022-06-11 DIAGNOSIS — E119 Type 2 diabetes mellitus without complications: Secondary | ICD-10-CM | POA: Diagnosis not present

## 2022-06-23 ENCOUNTER — Other Ambulatory Visit (HOSPITAL_COMMUNITY)
Admission: RE | Admit: 2022-06-23 | Discharge: 2022-06-23 | Disposition: A | Discharge status: Home / Self Care | Payer: 59 | Source: Ambulatory Visit | Attending: Nephrology | Admitting: Nephrology

## 2022-06-23 DIAGNOSIS — E1122 Type 2 diabetes mellitus with diabetic chronic kidney disease: Secondary | ICD-10-CM | POA: Insufficient documentation

## 2022-06-23 DIAGNOSIS — I5032 Chronic diastolic (congestive) heart failure: Secondary | ICD-10-CM | POA: Insufficient documentation

## 2022-06-23 DIAGNOSIS — N19 Unspecified kidney failure: Secondary | ICD-10-CM | POA: Diagnosis not present

## 2022-06-23 DIAGNOSIS — R808 Other proteinuria: Secondary | ICD-10-CM | POA: Insufficient documentation

## 2022-06-23 DIAGNOSIS — I129 Hypertensive chronic kidney disease with stage 1 through stage 4 chronic kidney disease, or unspecified chronic kidney disease: Secondary | ICD-10-CM | POA: Insufficient documentation

## 2022-06-23 DIAGNOSIS — N189 Chronic kidney disease, unspecified: Secondary | ICD-10-CM | POA: Insufficient documentation

## 2022-06-23 DIAGNOSIS — E211 Secondary hyperparathyroidism, not elsewhere classified: Secondary | ICD-10-CM | POA: Diagnosis not present

## 2022-06-23 LAB — RENAL FUNCTION PANEL
Albumin: 3.9 g/dL (ref 3.5–5.0)
Anion gap: 12 (ref 5–15)
BUN: 37 mg/dL — ABNORMAL HIGH (ref 8–23)
CO2: 27 mmol/L (ref 22–32)
Calcium: 8.7 mg/dL — ABNORMAL LOW (ref 8.9–10.3)
Chloride: 97 mmol/L — ABNORMAL LOW (ref 98–111)
Creatinine, Ser: 2.12 mg/dL — ABNORMAL HIGH (ref 0.44–1.00)
GFR, Estimated: 23 mL/min — ABNORMAL LOW (ref >60)
Glucose, Bld: 205 mg/dL — ABNORMAL HIGH (ref 70–99)
Phosphorus: 4.3 mg/dL (ref 2.5–4.6)
Potassium: 4 mmol/L (ref 3.5–5.1)
Sodium: 136 mmol/L (ref 135–145)

## 2022-06-23 LAB — CBC
HCT: 37.1 % (ref 36.0–46.0)
Hemoglobin: 11.6 g/dL — ABNORMAL LOW (ref 12.0–15.0)
MCH: 26.4 pg (ref 26.0–34.0)
MCHC: 31.3 g/dL (ref 30.0–36.0)
MCV: 84.3 fL (ref 80.0–100.0)
Platelets: 291 10*3/uL (ref 150–400)
RBC: 4.4 MIL/uL (ref 3.87–5.11)
RDW: 15.3 % (ref 11.5–15.5)
WBC: 5.4 10*3/uL (ref 4.0–10.5)
nRBC: 0 % (ref 0.0–0.2)

## 2022-06-23 LAB — PROTEIN / CREATININE RATIO, URINE
Creatinine, Urine: 103 mg/dL
Protein Creatinine Ratio: 0.28 mg/mg{Cre} — ABNORMAL HIGH (ref 0.00–0.15)
Total Protein, Urine: 29 mg/dL

## 2022-06-24 LAB — PTH, INTACT AND CALCIUM
Calcium, Total (PTH): 8.6 mg/dL — ABNORMAL LOW (ref 8.7–10.3)
PTH: 90 pg/mL — ABNORMAL HIGH (ref 15–65)

## 2022-07-02 DIAGNOSIS — N189 Chronic kidney disease, unspecified: Secondary | ICD-10-CM | POA: Diagnosis not present

## 2022-07-02 DIAGNOSIS — E1122 Type 2 diabetes mellitus with diabetic chronic kidney disease: Secondary | ICD-10-CM | POA: Diagnosis not present

## 2022-07-02 DIAGNOSIS — R808 Other proteinuria: Secondary | ICD-10-CM | POA: Diagnosis not present

## 2022-07-10 DIAGNOSIS — S8012XA Contusion of left lower leg, initial encounter: Secondary | ICD-10-CM | POA: Diagnosis not present

## 2022-07-19 ENCOUNTER — Other Ambulatory Visit: Payer: Self-pay | Admitting: Nurse Practitioner

## 2022-07-21 ENCOUNTER — Other Ambulatory Visit (HOSPITAL_COMMUNITY)
Admission: RE | Admit: 2022-07-21 | Discharge: 2022-07-21 | Disposition: A | Discharge status: Home / Self Care | Payer: 59 | Source: Ambulatory Visit | Attending: Nurse Practitioner | Admitting: Nurse Practitioner

## 2022-07-21 DIAGNOSIS — E1122 Type 2 diabetes mellitus with diabetic chronic kidney disease: Secondary | ICD-10-CM | POA: Diagnosis not present

## 2022-07-21 DIAGNOSIS — M7541 Impingement syndrome of right shoulder: Secondary | ICD-10-CM | POA: Diagnosis not present

## 2022-07-21 DIAGNOSIS — M25551 Pain in right hip: Secondary | ICD-10-CM | POA: Diagnosis not present

## 2022-07-21 DIAGNOSIS — G8929 Other chronic pain: Secondary | ICD-10-CM | POA: Diagnosis not present

## 2022-07-21 LAB — LIPID PANEL
Cholesterol: 139 mg/dL (ref 0–200)
HDL: 41 mg/dL (ref >40)
LDL Cholesterol: 67 mg/dL (ref 0–99)
Total CHOL/HDL Ratio: 3.4 RATIO
Triglycerides: 155 mg/dL — ABNORMAL HIGH (ref <150)
VLDL: 31 mg/dL (ref 0–40)

## 2022-07-21 LAB — TSH: TSH: 0.046 u[IU]/mL — ABNORMAL LOW (ref 0.350–4.500)

## 2022-07-21 LAB — T4, FREE: Free T4: 1.05 ng/dL (ref 0.61–1.12)

## 2022-07-23 ENCOUNTER — Other Ambulatory Visit: Payer: Self-pay

## 2022-07-23 DIAGNOSIS — E1122 Type 2 diabetes mellitus with diabetic chronic kidney disease: Secondary | ICD-10-CM

## 2022-07-23 MED ORDER — PEN NEEDLES 31G X 8 MM MISC
1 refills | Status: DC
Start: 2022-07-23 — End: 2022-12-10

## 2022-07-29 ENCOUNTER — Encounter: Payer: Self-pay | Admitting: *Deleted

## 2022-07-29 ENCOUNTER — Ambulatory Visit: Payer: Self-pay | Admitting: *Deleted

## 2022-07-29 NOTE — Patient Outreach (Signed)
  Care Coordination   07/29/2022 Name: Holly Baldwin MRN: [id number] DOB: 09-Mar-1945   Care Coordination Outreach Attempts:  An unsuccessful telephone outreach was attempted for a scheduled appointment today.  Follow Up Plan:  Additional outreach attempts will be made to offer the patient care coordination information and services.   Encounter Outcome:  No Answer   Care Coordination Interventions:  No, not indicated    Demetrios Loll, BSN, RN-BC RN Care Coordinator [facility]  [facility] Direct Dial: [phone] Main #: [phone]

## 2022-08-01 ENCOUNTER — Encounter: Payer: Self-pay | Admitting: *Deleted

## 2022-08-01 ENCOUNTER — Ambulatory Visit: Payer: Self-pay | Admitting: *Deleted

## 2022-08-01 NOTE — Patient Outreach (Signed)
Care Coordination   Follow Up Visit Note   08/01/2022 Name: Holly Baldwin MRN: [id number] DOB: [birth year]  Holly Baldwin is a 77 y.o. year old female who sees Assunta Found, MD for primary care. I spoke with  Holly Baldwin by phone today.  What matters to the patients health and wellness today?  Managing blood pressure, managing blood sugar, follow-up with PCP re: persistent cough    Goals Addressed             This Visit's Progress    Blood Sugar Management   On track    Care Coordination Interventions: Patient will continue medications a prescribed Patient will keep appointment with endocrinologist on 08/06/22 and will schedule a follow-up with PCP Patient will monitor blood sugar 4 times a day and as needed with continuous glucose monitor  Patient will call endocrinologist if she has 2 consecutive readings >200 or any readings below 70 Patient will continue to follow a low sodium/heart healthy and carb modified diet Patient will reach out to RN Care Manager [phone] with any resource or care coordination needs     Follow-up with PCP re: Persistent Cough       Care Coordination Goals: Patient will follow-up with PCP re: persistent cough "for a while" Denies post nasal drainage Unsure if she has acid reflux Non-smoker Not on an ACE inhibitor  Denies SOB Patient will reach out to provider with any new or worsening symptoms Patient will reach out to RN Care Coordinator [phone] any resource or care coordination needs     Manage Blood Pressure       Care Coordination Goals: Patient will take medications as directed and report any negative side effects to provider  Patient will use a pill box/organizer to help keep up with when to take medications Patient will monitor and record blood pressure 3 times per week and as needed and will call PCP or specialist with any readings outside of recommended range Patient will keep all recommended follow-up appointments with PCP and  specialists (cardiology, nephrology, etc) Patient will take blood pressure log to PCP and specialty appointments for review Patient will follow a low sodium/DASH diet  Patient will reach out to RN Care Coordinator [phone] with any care coordination or resource needs          SDOH assessments and interventions completed:  Yes  SDOH Interventions Today    Flowsheet Row Most Recent Value  SDOH Interventions   Transportation Interventions Intervention Not Indicated  Financial Strain Interventions Intervention Not Indicated        Care Coordination Interventions:  Yes, provided  Interventions Today    Flowsheet Row Most Recent Value  Chronic Disease   Chronic disease during today's visit Diabetes, Hypertension (HTN), Chronic Kidney Disease/End Stage Renal Disease (ESRD)  General Interventions   General Interventions Discussed/Reviewed --  [Discussed persistent cough "for a while". Denies PND, reflux, SOB, no smoking hx, not on an ACE Inhibitor]  Labs Hgb A1c every 3 months  Doctor Visits Discussed/Reviewed Doctor Visits Discussed, Doctor Visits Reviewed, PCP, Specialist, Annual Wellness Visits  Durable Medical Equipment (DME) Glucomoter, BP Cuff  [CGM]  PCP/Specialist Visits Compliance with follow-up visit  [schedule follow-up with PCP]  Communication with PCP/Specialists  [secure fax through [facility] re: need for f/u appt and to address persistent cough]  Exercise Interventions   Exercise Discussed/Reviewed Physical Activity  Physical Activity Discussed/Reviewed Physical Activity Discussed, Physical Activity Reviewed  Education Interventions   Education Provided Provided Education  Provided Verbal Education On --  [Follow-up with PCP re: persistent cough]  Nutrition Interventions   Nutrition Discussed/Reviewed Nutrition Discussed, Nutrition Reviewed, Carbohydrate meal planning, Portion sizes, Decreasing sugar intake, Fluid intake  Pharmacy Interventions   Pharmacy  Dicussed/Reviewed Pharmacy Topics Discussed, Pharmacy Topics Reviewed, Medications and their functions  Safety Interventions   Safety Discussed/Reviewed Safety Discussed, Safety Reviewed, Fall Risk       Follow up plan: Follow up call scheduled for 09/03/22    Encounter Outcome:  Pt. Visit Completed   Demetrios Loll, BSN, RN-BC RN Care Coordinator [facility]  [facility] Direct Dial: [phone] Main #: [phone]

## 2022-08-06 ENCOUNTER — Encounter: Payer: Self-pay | Admitting: Nurse Practitioner

## 2022-08-06 ENCOUNTER — Ambulatory Visit (INDEPENDENT_AMBULATORY_CARE_PROVIDER_SITE_OTHER): Payer: 59 | Admitting: Nurse Practitioner

## 2022-08-06 VITALS — BP 113/64 | HR 69 | Ht 64.0 in | Wt 197.2 lb

## 2022-08-06 DIAGNOSIS — E1122 Type 2 diabetes mellitus with diabetic chronic kidney disease: Secondary | ICD-10-CM

## 2022-08-06 DIAGNOSIS — N184 Chronic kidney disease, stage 4 (severe): Secondary | ICD-10-CM

## 2022-08-06 DIAGNOSIS — Z794 Long term (current) use of insulin: Secondary | ICD-10-CM

## 2022-08-06 DIAGNOSIS — E039 Hypothyroidism, unspecified: Secondary | ICD-10-CM

## 2022-08-06 DIAGNOSIS — E782 Mixed hyperlipidemia: Secondary | ICD-10-CM | POA: Diagnosis not present

## 2022-08-06 DIAGNOSIS — I10 Essential (primary) hypertension: Secondary | ICD-10-CM

## 2022-08-06 DIAGNOSIS — Z7984 Long term (current) use of oral hypoglycemic drugs: Secondary | ICD-10-CM | POA: Diagnosis not present

## 2022-08-06 LAB — POCT GLYCOSYLATED HEMOGLOBIN (HGB A1C): Hemoglobin A1C: 7.7 % — AB (ref 4.0–5.6)

## 2022-08-06 MED ORDER — LANTUS SOLOSTAR 100 UNIT/ML ~~LOC~~ SOPN: 25.0000 [IU] | PEN_INJECTOR | Freq: Every day | SUBCUTANEOUS | 2 refills | Status: DC

## 2022-08-06 NOTE — Progress Notes (Signed)
08/06/2022      Endocrinology follow-up note   Subjective:    Patient ID: Holly Baldwin, female    DOB: [birth year],    Past Medical History:  Diagnosis Date   Anxiety    Chronic abdominal pain    Diabetes mellitus    GERD (gastroesophageal reflux disease)    Hiatal hernia    Hypercholesteremia    Hypertension    Past Surgical History:  Procedure Laterality Date   ABDOMINAL HYSTERECTOMY     CATARACT EXTRACTION W/PHACO Right 01/09/2020   Procedure: CATARACT EXTRACTION PHACO AND INTRAOCULAR LENS PLACEMENT (IOC);  Surgeon: Fabio Pierce, MD;  Location: [facility];  Service: Ophthalmology;  Laterality: Right;  CDE: 12.01   CATARACT EXTRACTION W/PHACO Left 01/23/2020   Procedure: CATARACT EXTRACTION PHACO AND INTRAOCULAR LENS PLACEMENT LEFT EYE;  Surgeon: Fabio Pierce, MD;  Location: [facility];  Service: Ophthalmology;  Laterality: Left;  CDE 8.71   COLONOSCOPY N/A 06/23/2012   Procedure: COLONOSCOPY;  Surgeon: Malissa Hippo, MD;  Location: [facility];  Service: Endoscopy;  Laterality: N/A;  100-moved to 1200 Ann to notify pt   ESOPHAGOGASTRODUODENOSCOPY N/A 05/06/2012   Procedure: ESOPHAGOGASTRODUODENOSCOPY (EGD);  Surgeon: Malissa Hippo, MD;  Location: [facility];  Service: Endoscopy;  Laterality: N/A;   MASS EXCISION Right 05/26/2012   Procedure: EXCISION NEOPLASM RIGHT THIGH;  Surgeon: Dalia Heading, MD;  Location: [facility];  Service: General;  Laterality: Right;   Social History   Socioeconomic History   Marital status: Married    Spouse name: Not on file   Number of children: Not on file   Years of education: Not on file   Highest education level: Not on file  Occupational History   Not on file  Tobacco Use   Smoking status: Never   Smokeless tobacco: Never  Vaping Use   Vaping Use: Never used  Substance and Sexual Activity   Alcohol use: No   Drug use: No   Sexual activity: Not Currently    Birth control/protection: Post-menopausal  Other Topics Concern   Not  on file  Social History Narrative   Not on file   Social Determinants of Health   Financial Resource Strain: Low Risk  (08/01/2022)   Overall Financial Resource Strain (CARDIA)    Difficulty of Paying Living Expenses: Not very hard  Food Insecurity: No Food Insecurity (11/13/2021)   Hunger Vital Sign    Worried About Running Out of Food in the Last Year: Never true    Ran Out of Food in the Last Year: Never true  Transportation Needs: No Transportation Needs (08/01/2022)   PRAPARE - Administrator, Civil Service (Medical): No    Lack of Transportation (Non-Medical): No  Physical Activity: Inactive (05/07/2020)   Exercise Vital Sign    Days of Exercise per Week: 0 days    Minutes of Exercise per Session: 0 min  Stress: No Stress Concern Present (05/07/2020)   [employer] of Occupational Health - Occupational Stress Questionnaire    Feeling of Stress : Not at all  Social Connections: Socially Isolated (05/07/2020)   Social Connection and Isolation Panel [NHANES]    Frequency of Communication with Friends and Family: Once a week    Frequency of Social Gatherings with Friends and Family: Never    Attends Religious Services: Never    Database administrator or Organizations: No    Attends Banker Meetings: Never    Marital Status: Married  Outpatient Encounter Medications as of 08/06/2022  Medication Sig   albuterol (PROVENTIL HFA;VENTOLIN HFA) 108 (90 Base) MCG/ACT inhaler Inhale 1-2 puffs into the lungs every 6 (six) hours as needed for wheezing or shortness of breath.    calcitRIOL (ROCALTROL) 0.25 MCG capsule Take 0.25 mcg by mouth every Monday, Wednesday, and Friday.   clopidogrel (PLAVIX) 75 MG tablet Take 75 mg by mouth daily.   dapagliflozin propanediol (FARXIGA) 5 MG TABS tablet Take by mouth daily.   escitalopram (LEXAPRO) 10 MG tablet Take 10 mg by mouth daily.   Finerenone (KERENDIA) 10 MG TABS Take by mouth daily.   furosemide (LASIX) 20 MG tablet  Take 20 mg by mouth 2 (two) times daily.   hydrALAZINE (APRESOLINE) 50 MG tablet Take 50 mg by mouth 2 (two) times daily.   insulin lispro (HUMALOG) 100 UNIT/ML KwikPen Inject 2-8 Units into the skin 3 (three) times daily with meals.   Insulin Pen Needle (PEN NEEDLES) 31G X 8 MM MISC Use to take insulin 4 times daily   isosorbide mononitrate (IMDUR) 60 MG 24 hr tablet Take 1 tablet (60 mg total) by mouth daily.   levocetirizine (XYZAL) 5 MG tablet Take 5 mg by mouth every evening.   levothyroxine (SYNTHROID) 137 MCG tablet TAKE 1 TABLET BY MOUTH DAILY  BEFORE BREAKFAST   Magnesium 400 MG TABS Take by mouth.   NIFEdipine (PROCARDIA XL/NIFEDICAL-XL) 90 MG 24 hr tablet Take 90 mg by mouth daily.   olmesartan (BENICAR) 40 MG tablet Take 40 mg by mouth daily.   oxyCODONE (ROXICODONE) 5 MG immediate release tablet Take 0.5 tablets (2.5 mg total) by mouth every 6 (six) hours as needed.   potassium chloride SA (KLOR-CON M) 20 MEQ tablet Take 20 mEq by mouth daily.   RESTASIS 0.05 % ophthalmic emulsion 1 drop 2 (two) times daily.   simvastatin (ZOCOR) 40 MG tablet TAKE 1 TABLET EVERY DAY (Patient taking differently: Take 40 mg by mouth daily.)   [DISCONTINUED] glipiZIDE (GLUCOTROL) 5 MG tablet Take 5 mg by mouth daily.   [DISCONTINUED] insulin glargine (LANTUS SOLOSTAR) 100 UNIT/ML Solostar Pen Inject 20 Units into the skin at bedtime.   insulin glargine (LANTUS SOLOSTAR) 100 UNIT/ML Solostar Pen Inject 25 Units into the skin at bedtime.   No facility-administered encounter medications on file as of 08/06/2022.   ALLERGIES: Allergies  Allergen Reactions   Bee Venom Anaphylaxis   Penicillins Hives    .Has patient had a PCN reaction causing immediate rash, facial/tongue/throat swelling, SOB or lightheadedness with hypotension: Yes Has patient had a PCN reaction causing severe rash involving mucus membranes or skin necrosis: No Has patient had a PCN reaction that required hospitalization: Yes Has  patient had a PCN reaction occurring within the last 10 years: No If all of the above answers are "NO", then may proceed with Cephalosporin use.  .Has patient had a PCN reaction causing immediate rash, facial/tongue/throat swelling, SOB or lightheadedness with hypotension: Yes Has patient had a PCN reaction causing severe rash involving mucus membranes or skin necrosis: No Has patient had a PCN reaction that required hospitalization: Yes Has patient had a PCN reaction occurring within the last 10 years: No If all of the above answers are "NO", then may proceed with Cephalosporin use. .Has patient had a PCN reaction causing immediate rash, facial/tongue/throat swelling, SOB or lightheadedness with hypotension: Yes Has patient had a PCN reaction causing severe rash involving mucus membranes or skin necrosis: No Has patient had a PCN reaction  that required hospitalization: Yes Has patient had a PCN reaction occurring within the last 10 years: No If all of the above answers are "NO", then may proceed with Cephalosporin use.   VACCINATION STATUS:  There is no immunization history on file for this patient.  Diabetes She presents for her follow-up diabetic visit. She has type 2 diabetes mellitus. Onset time: she was diagnosed at approximate age of 49 years. Her disease course has been stable. There are no hypoglycemic associated symptoms. Pertinent negatives for hypoglycemia include no confusion, headaches, nervousness/anxiousness, pallor, seizures or tremors. Pertinent negatives for diabetes include no chest pain, no fatigue, no polydipsia, no polyphagia, no polyuria and no weight loss. There are no hypoglycemic complications. Symptoms are stable. Diabetic complications include nephropathy. Risk factors for coronary artery disease include diabetes mellitus, dyslipidemia, obesity, sedentary lifestyle and hypertension. Current diabetic treatment includes intensive insulin program and oral agent  (monotherapy). She is compliant with treatment most of the time. Her weight is fluctuating minimally. She is following a generally healthy diet. When asked about meal planning, she reported none. She has not had a previous visit with a dietitian. She never participates in exercise. Her home blood glucose trend is fluctuating minimally. Her overall blood glucose range is 140-180 mg/dl. (She presents today with her CGM and logs showing slightly above target glycemic profile overall.  Her POCT A1c today is 7.7%, improving from last visit of 7.1%.   Analysis of her CGM shows TIR 54%, TAR 45%, TBR 1% with a GMI of 7.6%.) An ACE inhibitor/angiotensin II receptor blocker is being taken. She does not see a podiatrist.Eye exam is current.  Thyroid Problem Presents for follow-up (She is currently on levothyroxine 137 mcg p.o. nightly.  She reports compliance with medication.) visit. Patient reports no anxiety, cold intolerance, constipation, depressed mood, diarrhea, fatigue, heat intolerance, palpitations, tremors, weight gain or weight loss. The symptoms have been stable. Past treatments include levothyroxine. Her past medical history is significant for diabetes and hyperlipidemia.  Hyperlipidemia This is a chronic problem. The current episode started more than 1 year ago. The problem is controlled. Recent lipid tests were reviewed and are normal. Exacerbating diseases include chronic renal disease, diabetes and obesity. Factors aggravating her hyperlipidemia include beta blockers and fatty foods. Pertinent negatives include no chest pain, myalgias or shortness of breath. Current antihyperlipidemic treatment includes statins. The current treatment provides mild improvement of lipids. Compliance problems include adherence to diet and adherence to exercise.  Risk factors for coronary artery disease include dyslipidemia, diabetes mellitus, hypertension, obesity, a sedentary lifestyle and post-menopausal.   Hypertension This is a chronic problem. The current episode started more than 1 year ago. The problem has been resolved since onset. The problem is controlled. Pertinent negatives include no chest pain, headaches, palpitations or shortness of breath. Agents associated with hypertension include thyroid hormones. Risk factors for coronary artery disease include dyslipidemia, diabetes mellitus, obesity, post-menopausal state and sedentary lifestyle. Past treatments include calcium channel blockers, central alpha agonists and diuretics. The current treatment provides mild improvement. Compliance problems include exercise and diet.  Hypertensive end-organ damage includes kidney disease. Identifiable causes of hypertension include chronic renal disease and a thyroid problem.     Review of systems  Constitutional: + stable body weight,  current Body mass index is 33.85 kg/m. , no fatigue, no subjective hyperthermia, no subjective hypothermia Eyes: no blurry vision, no xerophthalmia ENT: no sore throat, no nodules palpated in throat, no dysphagia/odynophagia, no hoarseness Cardiovascular: no chest pain, no shortness  of breath, no palpitations, no leg swelling Respiratory: no cough, no shortness of breath Gastrointestinal: no nausea/vomiting/diarrhea Musculoskeletal: no muscle/joint aches Skin: no rashes, no hyperemia Neurological: no tremors, no numbness, no tingling, no dizziness Psychiatric: no depression, no anxiety   Objective:    BP 113/64 (BP Location: Left Arm, Patient Position: Sitting, Cuff Size: Normal)   Pulse 69   Ht 5\' 4"  (1.626 m)   Wt 197 lb 3.2 oz (89.4 kg)   BMI 33.85 kg/m   Wt Readings from Last 3 Encounters:  08/06/22 197 lb 3.2 oz (89.4 kg)  05/05/22 196 lb (88.9 kg)  02/03/22 203 lb 9.6 oz (92.4 kg)    BP Readings from Last 3 Encounters:  08/06/22 113/64  05/05/22 130/70  02/03/22 (!) 143/69     Physical Exam- Limited  Constitutional:  Body mass index is 33.85  kg/m. , not in acute distress, ? Memory impairment Eyes:  EOMI, no exophthalmos Musculoskeletal: no gross deformities, strength intact in all four extremities, no gross restriction of joint movements Skin:  no rashes, no hyperemia Neurological: no tremor with outstretched hands   Diabetic Foot Exam - Simple   No data filed     Results for orders placed or performed in visit on 08/06/22  HgB A1c  Result Value Ref Range   Hemoglobin A1C 7.7 (A) 4.0 - 5.6 %   HbA1c POC (<> result, manual entry)     HbA1c, POC (prediabetic range)     HbA1c, POC (controlled diabetic range)     Diabetic Labs (most recent): Lab Results  Component Value Date   HGBA1C 7.7 (A) 08/06/2022   HGBA1C 7.1 (A) 05/05/2022   HGBA1C 7.5 (A) 02/03/2022   MICROALBUR 172.4 12/14/2017   MICROALBUR >600.0 03/09/2017   MICROALBUR 10.7 01/23/2016   Lipid Panel     Component Value Date/Time   CHOL 139 07/21/2022 0847   CHOL 184 05/09/2020 0932   TRIG 155 (H) 07/21/2022 0847   HDL 41 07/21/2022 0847   HDL 42 05/09/2020 0932   CHOLHDL 3.4 07/21/2022 0847   VLDL 31 07/21/2022 0847   LDLCALC 67 07/21/2022 0847   LDLCALC 108 (H) 05/09/2020 0932   LDLCALC 105 (H) 12/14/2017 0921     Assessment & Plan:   1) Controlled type 2 diabetes mellitus with complication  Her diabetes is complicated by CAD, neuropathy, microalbuminuria, worsening CKD,  and patient remains at a high risk for more acute and chronic complications of diabetes which include CAD, CVA, CKD, retinopathy, and neuropathy. These are all discussed in detail with the patient.  She presents today with her CGM and logs showing slightly above target glycemic profile overall.  Her POCT A1c today is 7.7%, improving from last visit of 7.1%.   Analysis of her CGM shows TIR 54%, TAR 45%, TBR 1% with a GMI of 7.6%.  - Nutritional counseling repeated at each appointment due to patients tendency to fall back in to old habits.  - The patient admits there is a  room for improvement in their diet and drink choices. -  Suggestion is made for the patient to avoid simple carbohydrates from their diet including Cakes, Sweet Desserts / Pastries, Ice Cream, Soda (diet and regular), Sweet Tea, Candies, Chips, Cookies, Sweet Pastries, Store Bought Juices, Alcohol in Excess of 1-2 drinks a day, Artificial Sweeteners, Coffee Creamer, and "Sugar-free" Products. This will help patient to have stable blood glucose profile and potentially avoid unintended weight gain.   - I encouraged the patient  to switch to unprocessed or minimally processed complex starch and increased protein intake (animal or plant source), fruits, and vegetables.   - Patient is advised to stick to a routine mealtimes to eat 3 meals a day and avoid unnecessary snacks (to snack only to correct hypoglycemia).  - I have approached patient with the following individualized plan to manage diabetes and patient agrees.  She will continue to require intensive treatment with basal/bolus insulin in order for her to achieve and maintain control of diabetes to target.    -For safety purposes, Glipizide should be avoided. With her renal impairment and advanced age, Glipizide increases her risk of hypoglycemia significantly.  Every attempt will be made to decrease her prandial insulin due to potential memory impairment which makes hypoglycemia a significant risk for her.  -She is advised to increase her Lantus to 25 units SQ nightly and continue her Novolog 2-8 units TID with meals if glucose is above 90 and she is eating (Specific instructions on how to titrate insulin dosage based on glucose readings given to patient in writing).  She can also continue Farxiga 5 mg po daily as recommended by her nephrologist/cardiologist.  -she is encouraged to continue monitoring blood glucose (using her CGM) at least 4 times daily, before meals and at bedtime and call the clinic if she has readings less than 70 or greater  than 200 for 3 tests in a row.  - Patient specific target  for A1c; LDL, HDL, Triglycerides, and  Waist Circumference were discussed in detail.  2) BP/HTN:  Her blood pressure is controlled to target.  She is advised to continue Lasix 40 mg po twice daily, Coreg 25 mg po twice daily, Lasix 20 mg po daily, Hydralazine 100 mg po BID,  Spironolactone 25 mg daily, and Benicar 40 mg po daily.  Will defer med changes to nephrologist.  3) Lipids/HPL: Her most recent lipid panel on 07/21/22 shows controlled LDL at 67.  She is advised to continue Atorvastatin 40 mg po daily at bedtime.  Side effects and precautions discussed with her.    4)  Weight/Diet:  Her Body mass index is 33.85 kg/m. She will benefit from continued modest weight loss.  CDE consult in progress, exercise, and carbohydrates information provided.  5) Hypothyroidism: -Her previsit thyroid function tests are consistent with appropriate hormone replacement.  TSH slightly suppressed but Free T4 normal.  She is advised to continue Levothyroxine 137 mg po daily before breakfast.  Will recheck TFTs prior to next visit and adjust dose accordingly.   - We discussed about the correct intake of her thyroid hormone, on empty stomach at fasting, with water, separated by at least 30 minutes from breakfast and other medications,  and separated by more than 4 hours from calcium, iron, multivitamins, acid reflux medications (PPIs). -Patient is made aware of the fact that thyroid hormone replacement is needed for life, dose to be adjusted by periodic monitoring of thyroid function tests.  6) Chronic Care/Health Maintenance: -Patient is on ACE and Statin medications and encouraged to continue to follow up with Ophthalmology, Podiatrist at least yearly or according to recommendations, and advised to  stay away from smoking. I have recommended yearly flu vaccine and pneumonia vaccination at least every 5 years; moderate intensity exercise for up to 150  minutes weekly; and  sleep for at least 7 hours a day.  I advised patient to maintain close follow up with her PCP for primary care needs.     I  spent  41  minutes in the care of the patient today including review of labs from CMP, Lipids, Thyroid Function, Hematology (current and previous including abstractions from other facilities); face-to-face time discussing  her blood glucose readings/logs, discussing hypoglycemia and hyperglycemia episodes and symptoms, medications doses, her options of short and long term treatment based on the latest standards of care / guidelines;  discussion about incorporating lifestyle medicine;  and documenting the encounter. Risk reduction counseling performed per USPSTF guidelines to reduce obesity and cardiovascular risk factors.     Please refer to Patient Instructions for Blood Glucose Monitoring and Insulin/Medications Dosing Guide"  in media tab for additional information. Please  also refer to " Patient Self Inventory" in the Media  tab for reviewed elements of pertinent patient history.  Holly Baldwin participated in the discussions, expressed understanding, and voiced agreement with the above plans.  All questions were answered to her satisfaction. she is encouraged to contact clinic should she have any questions or concerns prior to her return visit.    Follow up plan: Return in about 4 months (around 12/06/2022) for Diabetes F/U with A1c in office, No previsit labs, Bring meter and logs.   Ronny Bacon, [facility] [address] [place], [place] [address] Phone: [phone] Fax: [phone]  08/06/2022, 3:46 PM

## 2022-08-11 ENCOUNTER — Other Ambulatory Visit (HOSPITAL_COMMUNITY)
Admission: RE | Admit: 2022-08-11 | Discharge: 2022-08-11 | Disposition: A | Discharge status: Home / Self Care | Payer: 59 | Source: Ambulatory Visit | Attending: Nephrology | Admitting: Nephrology

## 2022-08-11 DIAGNOSIS — E1122 Type 2 diabetes mellitus with diabetic chronic kidney disease: Secondary | ICD-10-CM | POA: Diagnosis not present

## 2022-08-11 DIAGNOSIS — R808 Other proteinuria: Secondary | ICD-10-CM | POA: Diagnosis not present

## 2022-08-11 DIAGNOSIS — N189 Chronic kidney disease, unspecified: Secondary | ICD-10-CM | POA: Insufficient documentation

## 2022-08-11 LAB — PROTEIN / CREATININE RATIO, URINE
Creatinine, Urine: 65 mg/dL
Protein Creatinine Ratio: 0.45 mg/mg{Cre} — ABNORMAL HIGH (ref 0.00–0.15)
Total Protein, Urine: 29 mg/dL

## 2022-08-11 LAB — CBC
HCT: 38.8 % (ref 36.0–46.0)
Hemoglobin: 12.2 g/dL (ref 12.0–15.0)
MCH: 26.2 pg (ref 26.0–34.0)
MCHC: 31.4 g/dL (ref 30.0–36.0)
MCV: 83.3 fL (ref 80.0–100.0)
Platelets: 285 10*3/uL (ref 150–400)
RBC: 4.66 MIL/uL (ref 3.87–5.11)
RDW: 15.1 % (ref 11.5–15.5)
WBC: 6.1 10*3/uL (ref 4.0–10.5)
nRBC: 0 % (ref 0.0–0.2)

## 2022-08-11 LAB — IRON AND TIBC
Iron: 49 ug/dL (ref 28–170)
Saturation Ratios: 16 % (ref 10.4–31.8)
TIBC: 304 ug/dL (ref 250–450)
UIBC: 255 ug/dL

## 2022-08-11 LAB — RENAL FUNCTION PANEL
Albumin: 4 g/dL (ref 3.5–5.0)
Anion gap: 12 (ref 5–15)
BUN: 41 mg/dL — ABNORMAL HIGH (ref 8–23)
CO2: 27 mmol/L (ref 22–32)
Calcium: 8.7 mg/dL — ABNORMAL LOW (ref 8.9–10.3)
Chloride: 97 mmol/L — ABNORMAL LOW (ref 98–111)
Creatinine, Ser: 2.21 mg/dL — ABNORMAL HIGH (ref 0.44–1.00)
GFR, Estimated: 22 mL/min — ABNORMAL LOW (ref >60)
Glucose, Bld: 212 mg/dL — ABNORMAL HIGH (ref 70–99)
Phosphorus: 5.1 mg/dL — ABNORMAL HIGH (ref 2.5–4.6)
Potassium: 4.1 mmol/L (ref 3.5–5.1)
Sodium: 136 mmol/L (ref 135–145)

## 2022-08-11 LAB — FERRITIN: Ferritin: 30 ng/mL (ref 11–307)

## 2022-08-12 LAB — PARATHYROID HORMONE, INTACT (NO CA): PTH: 175 pg/mL — ABNORMAL HIGH (ref 15–65)

## 2022-08-20 DIAGNOSIS — N184 Chronic kidney disease, stage 4 (severe): Secondary | ICD-10-CM | POA: Diagnosis not present

## 2022-08-20 DIAGNOSIS — E1122 Type 2 diabetes mellitus with diabetic chronic kidney disease: Secondary | ICD-10-CM | POA: Diagnosis not present

## 2022-08-20 DIAGNOSIS — N2581 Secondary hyperparathyroidism of renal origin: Secondary | ICD-10-CM | POA: Diagnosis not present

## 2022-08-20 DIAGNOSIS — I5032 Chronic diastolic (congestive) heart failure: Secondary | ICD-10-CM | POA: Diagnosis not present

## 2022-08-27 ENCOUNTER — Other Ambulatory Visit: Payer: Self-pay | Admitting: Nurse Practitioner

## 2022-09-03 ENCOUNTER — Ambulatory Visit: Payer: Self-pay | Admitting: *Deleted

## 2022-09-03 ENCOUNTER — Encounter: Payer: Self-pay | Admitting: *Deleted

## 2022-09-03 NOTE — Patient Outreach (Signed)
  Care Coordination   Follow Up Visit Note   09/03/2022 Name: Holly Baldwin MRN: [id number] DOB: 10-28-1945  Holly Baldwin is a 77 y.o. year old female who sees Assunta Found, MD for primary care. I spoke with  Holly Baldwin by phone today.  What matters to the patients health and wellness today?  Manage blood sugar    Goals Addressed             This Visit's Progress    Blood Sugar Management   Not on track    Care Coordination Interventions: Patient will continue medications a prescribed Patient will keep all medical appointments Endocrinologist 12/10/22 Patient will monitor blood sugar 4 times a day and as needed with continuous glucose monitor  Patient will call endocrinologist if she has 2 consecutive readings >200 or any readings below 70 Patient will continue to follow a low sodium/heart healthy and carb modified diet Patient will reach out to RN Care Manager [phone] with any resource or care coordination needs        SDOH assessments and interventions completed:  No    Care Coordination Interventions:  Yes, provided  Interventions Today    Flowsheet Row Most Recent Value  Chronic Disease   Chronic disease during today's visit Diabetes  General Interventions   General Interventions Discussed/Reviewed General Interventions Discussed, General Interventions Reviewed, Labs, Durable Medical Equipment (DME), Doctor Visits  Labs Hgb A1c every 6 months  Doctor Visits Discussed/Reviewed Doctor Visits Discussed, Specialist, Doctor Visits Reviewed  [Visit with endocriologist on 6/5]  Durable Medical Equipment (DME) Glucomoter  PCP/Specialist Visits Compliance with follow-up visit  [next endocrionlogy appointment is on 12/10/22]  Education Interventions   Education Provided Provided Education  Provided Verbal Education On Labs  Labs Reviewed Hgb A1c  [A1C increased from 7.1 to 7.7]       Follow up plan: Follow up call scheduled for 09/09/22    Encounter Outcome:  Pt.  Request to Call Back. Patient was not able to complete the telephone visit today because she was on the road. She requested a call back to further discuss increase in A1C and interventions to lower blood sugar.   Demetrios Loll, BSN, RN-BC RN Care Coordinator [facility]  [facility] Direct Dial: [phone] Main #: [phone]

## 2022-09-07 ENCOUNTER — Other Ambulatory Visit: Payer: Self-pay | Admitting: Nurse Practitioner

## 2022-09-09 ENCOUNTER — Encounter: Payer: Self-pay | Admitting: *Deleted

## 2022-09-09 ENCOUNTER — Ambulatory Visit: Payer: Self-pay | Admitting: *Deleted

## 2022-09-09 ENCOUNTER — Other Ambulatory Visit: Payer: Self-pay | Admitting: Nurse Practitioner

## 2022-09-09 MED ORDER — INSULIN LISPRO (1 UNIT DIAL) 100 UNIT/ML (KWIKPEN): PEN_INJECTOR | SUBCUTANEOUS | 3 refills | Status: DC

## 2022-09-09 MED ORDER — LANTUS SOLOSTAR 100 UNIT/ML ~~LOC~~ SOPN: 25.0000 [IU] | PEN_INJECTOR | Freq: Every day | SUBCUTANEOUS | 3 refills | Status: DC

## 2022-09-09 MED ORDER — LEVOTHYROXINE SODIUM 137 MCG PO TABS: 137.0000 ug | ORAL_TABLET | Freq: Every day | ORAL | 3 refills | Status: DC

## 2022-09-09 NOTE — Patient Outreach (Signed)
Care Coordination   Follow Up Visit Note   09/09/2022 Name: Holly Baldwin MRN: [id number] DOB: 1946/02/09  Holly Baldwin is a 77 y.o. year old female who sees Assunta Found, MD for primary care. I spoke with  Holly Baldwin by phone today.  What matters to the patients health and wellness today?  Managing medications and having them all filled by [facility]    Goals Addressed             This Visit's Progress    Blood Sugar Management   On track    Care Coordination Interventions: Patient will continue medications a prescribed Patient would like to have all medications filled through [facility] and not [facility] Patient will keep all medical appointments Endocrinologist 12/10/22 Patient will monitor blood sugar 4 times a day and as needed with continuous glucose monitor  Patient will call endocrinologist if she has 2 consecutive readings >200 or any readings below 70 Patient will continue to follow a low sodium/heart healthy and carb modified diet Patient will consider talking with dietician Patient will reach out to RN Care Manager [phone] with any resource or care coordination needs     COMPLETED: Follow-up with PCP re: Persistent Cough       Care Coordination Goals: Patient will follow-up with PCP re: persistent cough "for a while" Denies post nasal drainage Unsure if she has acid reflux Non-smoker Not on an ACE inhibitor  Denies SOB Patient will reach out to provider with any new or worsening symptoms Patient will reach out to RN Care Coordinator [phone] any resource or care coordination needs     Manage Blood Pressure   On track    Care Coordination Goals: Patient will take medications as directed and report any negative side effects to provider  Patient will use a pill box/organizer to help keep up with when to take medications Patient will monitor and record blood pressure 3 times per week and as needed and will call PCP or specialist with any readings  outside of recommended range Patient will keep all recommended follow-up appointments with PCP and specialists (cardiology, nephrology, etc) Patient will take blood pressure log to PCP and specialty appointments for review Patient will follow a low sodium/DASH diet  Patient will reach out to RN Care Coordinator [phone] with any care coordination or resource needs          SDOH assessments and interventions completed:  Yes  SDOH Interventions Today    Flowsheet Row Most Recent Value  SDOH Interventions   Transportation Interventions Intervention Not Indicated  Physical Activity Interventions Patient Declined      Care Coordination Interventions:  Yes, provided  Interventions Today    Flowsheet Row Most Recent Value  Chronic Disease   Chronic disease during today's visit Diabetes  General Interventions   General Interventions Discussed/Reviewed General Interventions Discussed, General Interventions Reviewed, Labs, Durable Medical Equipment (DME), Doctor Visits, Communication with  Labs Hgb A1c every 3 months  Doctor Visits Discussed/Reviewed Doctor Visits Discussed, Doctor Visits Reviewed, PCP, Specialist  Durable Medical Equipment (DME) Glucomoter, Other, BP Cuff  PCP/Specialist Visits Compliance with follow-up visit  [endocrinologist on 12/10/22]  Communication with PCP/Specialists  Ronny Bacon, NP (endocrinology) via Staff mesage. Patient would like to have all of her medications filled through [facility]. Currently meds from PCP are filled through them but meds prescribed by specialists are being sent to [facility].]  Exercise Interventions   Exercise Discussed/Reviewed Physical Activity, Exercise Discussed, Exercise Reviewed  Physical Activity  Discussed/Reviewed Physical Activity Discussed, Physical Activity Reviewed  [No exercise. Patient declines at this time. Exercise encouraged.]  Education Interventions   Education Provided Provided Education  Provided Verbal  Education On [employer], When to see the doctor, Mental Health/Coping with Illness, Nutrition, Labs, Blood Sugar Monitoring, Medication, Other  [Explained that insurance may prefer [facility] over [employer] , but that if she is getting most of her meds at [facility] without any trouble, she may be able to get all of them there. Lantus increased to 25 units QHS.]  Labs Reviewed Hgb A1c  [increased from 7.1 to 7.7. Patient is unsure why. No changes in diet or activity level.]  Nutrition Interventions   Nutrition Discussed/Reviewed Nutrition Discussed, Nutrition Reviewed, Carbohydrate meal planning, Portion sizes, Decreasing sugar intake, Fluid intake  [declines visit with dietician]  Pharmacy Interventions   Pharmacy Dicussed/Reviewed Pharmacy Topics Discussed, Pharmacy Topics Reviewed, Medications and their functions  [Reviewed mes. Glipizide was sent by [facility] on 08/25/22. This is not on her med list. Notified endocrinologist that this may need to be cancelled. They should also cancel all other precriptions if they are willing to send them to [facility] as Pt requests]  Safety Interventions   Safety Discussed/Reviewed Safety Discussed, Safety Reviewed       Follow up plan: Follow up call scheduled for 10/10/22    Encounter Outcome:  Pt. Visit Completed   Demetrios Loll, BSN, RN-BC RN Care Coordinator [facility]  [facility] Direct Dial: [phone] Main #: [phone]

## 2022-09-10 DIAGNOSIS — E119 Type 2 diabetes mellitus without complications: Secondary | ICD-10-CM | POA: Diagnosis not present

## 2022-09-19 DIAGNOSIS — J069 Acute upper respiratory infection, unspecified: Secondary | ICD-10-CM | POA: Diagnosis not present

## 2022-09-19 DIAGNOSIS — J309 Allergic rhinitis, unspecified: Secondary | ICD-10-CM | POA: Diagnosis not present

## 2022-10-10 ENCOUNTER — Encounter: Payer: Self-pay | Admitting: *Deleted

## 2022-10-10 ENCOUNTER — Ambulatory Visit: Payer: Self-pay | Admitting: *Deleted

## 2022-10-10 NOTE — Patient Outreach (Signed)
Care Coordination   Follow Up Visit Note   10/10/2022 Name: Holly Baldwin MRN: [id number] DOB: 1945/10/04  Holly Baldwin is a 77 y.o. year old female who sees Assunta Found, MD for primary care. I spoke with  Holly Baldwin by phone today.  What matters to the patients health and wellness today?  Managing blood pressure and blood sugar    Goals Addressed             This Visit's Progress    Blood Sugar Management   On track    Care Coordination Interventions: Patient will continue medications a prescribed Patient will keep all medical appointments Endocrinologist 12/10/22 Patient will monitor blood sugar 4 times a day and as needed with continuous glucose monitor  Patient will call endocrinologist if she has 2 consecutive readings >200 or any readings below 70 Patient will continue to follow a low sodium/heart healthy and carb modified diet Patient will consider talking with dietician Patient will schedule her yearly eye exam Patient will continue to check feet daily for any cuts, sores, calluses, etc. and will call provider if she notices any changes in her feet Patient will reach out to RN Care Manager [phone] with any resource or care coordination needs     Manage Blood Pressure   On track    Care Coordination Goals: Patient will take medications as directed and report any negative side effects to provider  Patient will monitor and record blood pressure 3 times per week and as needed and will call PCP or specialist with any readings outside of recommended range Patient will keep all recommended follow-up appointments with PCP and specialists (cardiology, nephrology, etc) Patient will take blood pressure log to PCP and specialty appointments for review Patient will follow a low sodium/DASH diet  Patient will reach out to RN Care Coordinator [phone] with any care coordination or resource needs          SDOH assessments and interventions completed:  Yes SDOH  Interventions Today    Flowsheet Row Most Recent Value  SDOH Interventions   Financial Strain Interventions Intervention Not Indicated  Physical Activity Interventions Patient Declined        Care Coordination Interventions:  Yes, provided  Interventions Today    Flowsheet Row Most Recent Value  Chronic Disease   Chronic disease during today's visit Diabetes, Hypertension (HTN)  General Interventions   General Interventions Discussed/Reviewed General Interventions Discussed, Annual Foot Exam, General Interventions Reviewed, Annual Eye Exam, Durable Medical Equipment (DME), Labs, Doctor Visits  Labs Hgb A1c every 3 months, Kidney Function  Doctor Visits Discussed/Reviewed Doctor Visits Discussed, Doctor Visits Reviewed, Annual Wellness Visits, PCP, Specialist  Durable Medical Equipment (DME) BP Cuff, Glucomoter  [continuous glucose monitor. Blood sugar was 89 this morning. No readings less than 70 or above 200. Reports blood pressure is "fine" but did not have readings available. Denies readings above 140/90.]  PCP/Specialist Visits Compliance with follow-up visit  [10/22/22 with nephrologist, 12/10/22 with endocrinologist]  Exercise Interventions   Exercise Discussed/Reviewed Exercise Discussed, Exercise Reviewed, Physical Activity  Physical Activity Discussed/Reviewed Physical Activity Discussed, Physical Activity Reviewed  [encouraged exercise with at least 150 minutes of moderate activity per week]  Education Interventions   Education Provided Provided Education  Provided Verbal Education On Nutrition, Foot Care, Eye Care, Labs, Blood Sugar Monitoring, Exercise, Medication, When to see the doctor  [schedule yearly eye exam. Monitor feet for any sores, calluses, etc.]  Labs Reviewed Hgb A1c  [08/06/22 A1C 7.7%]  Nutrition Interventions   Nutrition Discussed/Reviewed Nutrition Discussed, Nutrition Reviewed, Carbohydrate meal planning, Adding fruits and vegetables, Fluid intake, Portion  sizes, Decreasing sugar intake, Increasing proteins  [Continue to consider a dietician referral. Watch simple carbohydrate intakes. Encouraged 3 meals a day with 30 GM of CHO and up to 2 snacks per day with less than 15 GM of CHO.]  Pharmacy Interventions   Pharmacy Dicussed/Reviewed Pharmacy Topics Discussed, Pharmacy Topics Reviewed, Medications and their functions  [taking medications regularly with no questions or concerns]  Safety Interventions   Safety Discussed/Reviewed Safety Discussed, Safety Reviewed       Follow up plan: Follow up call scheduled for 12/17/22    Encounter Outcome:  Pt. Visit Completed   Demetrios Loll, BSN, RN-BC RN Care Coordinator [facility]  [facility] Direct Dial: [phone] Main #: [phone]

## 2022-10-15 DIAGNOSIS — R808 Other proteinuria: Secondary | ICD-10-CM | POA: Diagnosis not present

## 2022-10-15 DIAGNOSIS — N2581 Secondary hyperparathyroidism of renal origin: Secondary | ICD-10-CM | POA: Diagnosis not present

## 2022-10-15 DIAGNOSIS — E1129 Type 2 diabetes mellitus with other diabetic kidney complication: Secondary | ICD-10-CM | POA: Diagnosis not present

## 2022-10-15 DIAGNOSIS — I5032 Chronic diastolic (congestive) heart failure: Secondary | ICD-10-CM | POA: Diagnosis not present

## 2022-10-15 DIAGNOSIS — N189 Chronic kidney disease, unspecified: Secondary | ICD-10-CM | POA: Diagnosis not present

## 2022-10-22 DIAGNOSIS — N184 Chronic kidney disease, stage 4 (severe): Secondary | ICD-10-CM | POA: Diagnosis not present

## 2022-10-22 DIAGNOSIS — E1122 Type 2 diabetes mellitus with diabetic chronic kidney disease: Secondary | ICD-10-CM | POA: Diagnosis not present

## 2022-10-22 DIAGNOSIS — I5032 Chronic diastolic (congestive) heart failure: Secondary | ICD-10-CM | POA: Diagnosis not present

## 2022-10-22 DIAGNOSIS — N2581 Secondary hyperparathyroidism of renal origin: Secondary | ICD-10-CM | POA: Diagnosis not present

## 2022-11-14 ENCOUNTER — Telehealth: Payer: Self-pay

## 2022-11-14 NOTE — Telephone Encounter (Signed)
Tried to call pt but did not receive an answer and was unable to leave a message.

## 2022-12-01 ENCOUNTER — Other Ambulatory Visit: Payer: Self-pay | Admitting: Nurse Practitioner

## 2022-12-10 ENCOUNTER — Encounter: Payer: Self-pay | Admitting: Nurse Practitioner

## 2022-12-10 ENCOUNTER — Ambulatory Visit (INDEPENDENT_AMBULATORY_CARE_PROVIDER_SITE_OTHER): Payer: 59 | Admitting: Nurse Practitioner

## 2022-12-10 VITALS — BP 121/75 | HR 71 | Ht 64.0 in | Wt 188.2 lb

## 2022-12-10 DIAGNOSIS — E039 Hypothyroidism, unspecified: Secondary | ICD-10-CM

## 2022-12-10 DIAGNOSIS — E782 Mixed hyperlipidemia: Secondary | ICD-10-CM | POA: Diagnosis not present

## 2022-12-10 DIAGNOSIS — E119 Type 2 diabetes mellitus without complications: Secondary | ICD-10-CM | POA: Diagnosis not present

## 2022-12-10 DIAGNOSIS — Z794 Long term (current) use of insulin: Secondary | ICD-10-CM | POA: Diagnosis not present

## 2022-12-10 DIAGNOSIS — Z7984 Long term (current) use of oral hypoglycemic drugs: Secondary | ICD-10-CM | POA: Diagnosis not present

## 2022-12-10 DIAGNOSIS — I10 Essential (primary) hypertension: Secondary | ICD-10-CM

## 2022-12-10 DIAGNOSIS — N184 Chronic kidney disease, stage 4 (severe): Secondary | ICD-10-CM

## 2022-12-10 DIAGNOSIS — E1122 Type 2 diabetes mellitus with diabetic chronic kidney disease: Secondary | ICD-10-CM | POA: Diagnosis not present

## 2022-12-10 LAB — POCT GLYCOSYLATED HEMOGLOBIN (HGB A1C): Hemoglobin A1C: 7 % — AB (ref 4.0–5.6)

## 2022-12-10 MED ORDER — LANTUS SOLOSTAR 100 UNIT/ML ~~LOC~~ SOPN: 15.0000 [IU] | PEN_INJECTOR | Freq: Every day | SUBCUTANEOUS | 3 refills | Status: DC

## 2022-12-10 MED ORDER — PEN NEEDLES 31G X 8 MM MISC
6 refills | Status: DC
Start: 2022-12-10 — End: 2023-07-17

## 2022-12-10 NOTE — Patient Instructions (Signed)

## 2022-12-10 NOTE — Progress Notes (Signed)
12/10/2022      Endocrinology follow-up note   Subjective:    Patient ID: Holly Baldwin, female    DOB: 1946/01/13,    Past Medical History:  Diagnosis Date   Anxiety    Chronic abdominal pain    Diabetes mellitus    GERD (gastroesophageal reflux disease)    Hiatal hernia    Hypercholesteremia    Hypertension    Past Surgical History:  Procedure Laterality Date   ABDOMINAL HYSTERECTOMY     CATARACT EXTRACTION W/PHACO Right 01/09/2020   Procedure: CATARACT EXTRACTION PHACO AND INTRAOCULAR LENS PLACEMENT (IOC);  Surgeon: Fabio Pierce, MD;  Location: [facility];  Service: Ophthalmology;  Laterality: Right;  CDE: 12.01   CATARACT EXTRACTION W/PHACO Left 01/23/2020   Procedure: CATARACT EXTRACTION PHACO AND INTRAOCULAR LENS PLACEMENT LEFT EYE;  Surgeon: Fabio Pierce, MD;  Location: [facility];  Service: Ophthalmology;  Laterality: Left;  CDE 8.71   COLONOSCOPY N/A 06/23/2012   Procedure: COLONOSCOPY;  Surgeon: Malissa Hippo, MD;  Location: [facility];  Service: Endoscopy;  Laterality: N/A;  100-moved to 1200 Ann to notify pt   ESOPHAGOGASTRODUODENOSCOPY N/A 05/06/2012   Procedure: ESOPHAGOGASTRODUODENOSCOPY (EGD);  Surgeon: Malissa Hippo, MD;  Location: [facility];  Service: Endoscopy;  Laterality: N/A;   MASS EXCISION Right 05/26/2012   Procedure: EXCISION NEOPLASM RIGHT THIGH;  Surgeon: Dalia Heading, MD;  Location: [facility];  Service: General;  Laterality: Right;   Social History   Socioeconomic History   Marital status: Married    Spouse name: Not on file   Number of children: Not on file   Years of education: Not on file   Highest education level: Not on file  Occupational History   Not on file  Tobacco Use   Smoking status: Never   Smokeless tobacco: Never  Vaping Use   Vaping status: Never Used  Substance and Sexual Activity   Alcohol use: No   Drug use: No   Sexual activity: Not Currently    Birth control/protection: Post-menopausal  Other Topics Concern    Not on file  Social History Narrative   Not on file   Social Determinants of Health   Financial Resource Strain: Low Risk  (10/10/2022)   Overall Financial Resource Strain (CARDIA)    Difficulty of Paying Living Expenses: Not very hard  Food Insecurity: No Food Insecurity (11/13/2021)   Hunger Vital Sign    Worried About Running Out of Food in the Last Year: Never true    Ran Out of Food in the Last Year: Never true  Transportation Needs: No Transportation Needs (09/09/2022)   PRAPARE - Administrator, Civil Service (Medical): No    Lack of Transportation (Non-Medical): No  Physical Activity: Inactive (10/10/2022)   Exercise Vital Sign    Days of Exercise per Week: 0 days    Minutes of Exercise per Session: 0 min  Stress: No Stress Concern Present (05/07/2020)   [employer] of Occupational Health - Occupational Stress Questionnaire    Feeling of Stress : Not at all  Social Connections: Socially Isolated (05/07/2020)   Social Connection and Isolation Panel [NHANES]    Frequency of Communication with Friends and Family: Once a week    Frequency of Social Gatherings with Friends and Family: Never    Attends Religious Services: Never    Database administrator or Organizations: No    Attends Banker Meetings: Never    Marital Status: Married  Outpatient Encounter Medications as of 12/10/2022  Medication Sig   dapagliflozin propanediol (FARXIGA) 5 MG TABS tablet Take by mouth daily.   escitalopram (LEXAPRO) 10 MG tablet Take 10 mg by mouth daily.   Finerenone (KERENDIA) 10 MG TABS Take by mouth daily.   furosemide (LASIX) 20 MG tablet Take 20 mg by mouth 2 (two) times daily.   hydrALAZINE (APRESOLINE) 50 MG tablet Take 50 mg by mouth 2 (two) times daily.   isosorbide mononitrate (IMDUR) 60 MG 24 hr tablet Take 1 tablet (60 mg total) by mouth daily.   levocetirizine (XYZAL) 5 MG tablet Take 5 mg by mouth every evening.   levothyroxine (SYNTHROID) 137 MCG tablet  Take 1 tablet (137 mcg total) by mouth daily before breakfast.   Magnesium 400 MG TABS Take by mouth.   Multiple Vitamins-Minerals (PRESERVISION AREDS 2) CAPS Take by mouth 2 (two) times daily.   NIFEdipine (PROCARDIA XL/NIFEDICAL-XL) 90 MG 24 hr tablet Take 90 mg by mouth daily.   olmesartan (BENICAR) 40 MG tablet Take 40 mg by mouth daily.   oxyCODONE (ROXICODONE) 5 MG immediate release tablet Take 0.5 tablets (2.5 mg total) by mouth every 6 (six) hours as needed.   RESTASIS 0.05 % ophthalmic emulsion 1 drop 2 (two) times daily.   simvastatin (ZOCOR) 40 MG tablet TAKE 1 TABLET EVERY DAY (Patient taking differently: Take 40 mg by mouth daily.)   [DISCONTINUED] insulin lispro (HUMALOG KWIKPEN) 100 UNIT/ML KwikPen INJECT SUBCUTANEOUSLY 2 TO 8  UNITS 3 TIMES DAILY WITH MEALS   [DISCONTINUED] Insulin Pen Needle (PEN NEEDLES) 31G X 8 MM MISC Use to take insulin 4 times daily   albuterol (PROVENTIL HFA;VENTOLIN HFA) 108 (90 Base) MCG/ACT inhaler Inhale 1-2 puffs into the lungs every 6 (six) hours as needed for wheezing or shortness of breath.    calcitRIOL (ROCALTROL) 0.25 MCG capsule Take 0.25 mcg by mouth every Monday, Wednesday, and Friday.   clopidogrel (PLAVIX) 75 MG tablet Take 75 mg by mouth daily.   insulin glargine (LANTUS SOLOSTAR) 100 UNIT/ML Solostar Pen Inject 15 Units into the skin at bedtime.   Insulin Pen Needle (PEN NEEDLES) 31G X 8 MM MISC Use to take insulin 1 time a day   potassium chloride SA (KLOR-CON M) 20 MEQ tablet Take 20 mEq by mouth daily. (Patient not taking: Reported on 12/10/2022)   [DISCONTINUED] insulin glargine (LANTUS SOLOSTAR) 100 UNIT/ML Solostar Pen Inject 25 Units into the skin at bedtime. (Patient not taking: Reported on 12/10/2022)   No facility-administered encounter medications on file as of 12/10/2022.   ALLERGIES: Allergies  Allergen Reactions   Bee Venom Anaphylaxis   Penicillins Hives    .Has patient had a PCN reaction causing immediate rash,  facial/tongue/throat swelling, SOB or lightheadedness with hypotension: Yes Has patient had a PCN reaction causing severe rash involving mucus membranes or skin necrosis: No Has patient had a PCN reaction that required hospitalization: Yes Has patient had a PCN reaction occurring within the last 10 years: No If all of the above answers are "NO", then may proceed with Cephalosporin use.  .Has patient had a PCN reaction causing immediate rash, facial/tongue/throat swelling, SOB or lightheadedness with hypotension: Yes Has patient had a PCN reaction causing severe rash involving mucus membranes or skin necrosis: No Has patient had a PCN reaction that required hospitalization: Yes Has patient had a PCN reaction occurring within the last 10 years: No If all of the above answers are "NO", then may proceed with Cephalosporin use. Marland KitchenHas  patient had a PCN reaction causing immediate rash, facial/tongue/throat swelling, SOB or lightheadedness with hypotension: Yes Has patient had a PCN reaction causing severe rash involving mucus membranes or skin necrosis: No Has patient had a PCN reaction that required hospitalization: Yes Has patient had a PCN reaction occurring within the last 10 years: No If all of the above answers are "NO", then may proceed with Cephalosporin use.   VACCINATION STATUS:  There is no immunization history on file for this patient.  Diabetes She presents for her follow-up diabetic visit. She has type 2 diabetes mellitus. Onset time: she was diagnosed at approximate age of 49 years. Her disease course has been improving. There are no hypoglycemic associated symptoms. Pertinent negatives for hypoglycemia include no confusion, headaches, nervousness/anxiousness, pallor, seizures or tremors. Pertinent negatives for diabetes include no chest pain, no fatigue, no polydipsia, no polyphagia, no polyuria and no weight loss. There are no hypoglycemic complications. Symptoms are stable. Diabetic  complications include nephropathy. Risk factors for coronary artery disease include diabetes mellitus, dyslipidemia, obesity, sedentary lifestyle and hypertension. Current diabetic treatment includes intensive insulin program and oral agent (monotherapy). She is compliant with treatment some of the time (has not been taking her Lantus due to lower glucose). Her weight is fluctuating minimally. She is following a generally healthy diet. When asked about meal planning, she reported none. She has not had a previous visit with a dietitian. She never participates in exercise. Her home blood glucose trend is decreasing steadily. Her overall blood glucose range is 140-180 mg/dl. (She presents today with her CGM and logs showing tight profile overall.  Her POCT A1c today is 7%, improving from last visit of 7.7%.   Analysis of her CGM shows TIR 81%, TAR 19%, TBR 0% with a GMI of 7%.  She has not been taking the Lantus, only Novolog 10 units TID with meals and her Marcelline Deist.) An ACE inhibitor/angiotensin II receptor blocker is being taken. She does not see a podiatrist.Eye exam is current.  Thyroid Problem Presents for follow-up (She is currently on levothyroxine 137 mcg p.o. nightly.  She reports compliance with medication.) visit. Patient reports no anxiety, cold intolerance, constipation, depressed mood, diarrhea, fatigue, heat intolerance, palpitations, tremors, weight gain or weight loss. The symptoms have been stable. Past treatments include levothyroxine. Her past medical history is significant for diabetes and hyperlipidemia.  Hyperlipidemia This is a chronic problem. The current episode started more than 1 year ago. The problem is controlled. Recent lipid tests were reviewed and are normal. Exacerbating diseases include chronic renal disease, diabetes and obesity. Factors aggravating her hyperlipidemia include beta blockers and fatty foods. Pertinent negatives include no chest pain, myalgias or shortness of breath.  Current antihyperlipidemic treatment includes statins. The current treatment provides mild improvement of lipids. Compliance problems include adherence to diet and adherence to exercise.  Risk factors for coronary artery disease include dyslipidemia, diabetes mellitus, hypertension, obesity, a sedentary lifestyle and post-menopausal.  Hypertension This is a chronic problem. The current episode started more than 1 year ago. The problem has been resolved since onset. The problem is controlled. Pertinent negatives include no chest pain, headaches, palpitations or shortness of breath. Agents associated with hypertension include thyroid hormones. Risk factors for coronary artery disease include dyslipidemia, diabetes mellitus, obesity, post-menopausal state and sedentary lifestyle. Past treatments include calcium channel blockers, central alpha agonists and diuretics. The current treatment provides mild improvement. Compliance problems include exercise and diet.  Hypertensive end-organ damage includes kidney disease. Identifiable causes of hypertension  include chronic renal disease and a thyroid problem.     Review of systems  Constitutional: + decreasing body weight,  current Body mass index is 32.3 kg/m. , no fatigue, no subjective hyperthermia, no subjective hypothermia Eyes: no blurry vision, no xerophthalmia ENT: no sore throat, no nodules palpated in throat, no dysphagia/odynophagia, no hoarseness Cardiovascular: no chest pain, no shortness of breath, no palpitations, no leg swelling Respiratory: no cough, no shortness of breath Gastrointestinal: no nausea/vomiting/diarrhea Musculoskeletal: no muscle/joint aches Skin: no rashes, no hyperemia Neurological: no tremors, no numbness, no tingling, no dizziness Psychiatric: no depression, no anxiety   Objective:    BP 121/75 (BP Location: Right Arm, Patient Position: Sitting, Cuff Size: Large)   Pulse 71   Ht 5\' 4"  (1.626 m)   Wt 188 lb 3.2 oz  (85.4 kg)   BMI 32.30 kg/m   Wt Readings from Last 3 Encounters:  12/10/22 188 lb 3.2 oz (85.4 kg)  08/06/22 197 lb 3.2 oz (89.4 kg)  05/05/22 196 lb (88.9 kg)    BP Readings from Last 3 Encounters:  12/10/22 121/75  08/06/22 113/64  05/05/22 130/70     Physical Exam- Limited  Constitutional:  Body mass index is 32.3 kg/m. , not in acute distress, ? Memory impairment Eyes:  EOMI, no exophthalmos Musculoskeletal: no gross deformities, strength intact in all four extremities, no gross restriction of joint movements Skin:  no rashes, no hyperemia Neurological: no tremor with outstretched hands   Diabetic Foot Exam - Simple   Simple Foot Form Diabetic Foot exam was performed with the following findings: Yes 12/10/2022 11:22 AM  Visual Inspection No deformities, no ulcerations, no other skin breakdown bilaterally: Yes Sensation Testing Intact to touch and monofilament testing bilaterally: Yes Pulse Check Posterior Tibialis and Dorsalis pulse intact bilaterally: Yes Comments     Results for orders placed or performed in visit on 12/10/22  HgB A1c  Result Value Ref Range   Hemoglobin A1C 7.0 (A) 4.0 - 5.6 %   HbA1c POC (<> result, manual entry)     HbA1c, POC (prediabetic range)     HbA1c, POC (controlled diabetic range)     Diabetic Labs (most recent): Lab Results  Component Value Date   HGBA1C 7.0 (A) 12/10/2022   HGBA1C 7.7 (A) 08/06/2022   HGBA1C 7.1 (A) 05/05/2022   MICROALBUR 172.4 12/14/2017   MICROALBUR >600.0 03/09/2017   MICROALBUR 10.7 01/23/2016   Lipid Panel     Component Value Date/Time   CHOL 139 07/21/2022 0847   CHOL 184 05/09/2020 0932   TRIG 155 (H) 07/21/2022 0847   HDL 41 07/21/2022 0847   HDL 42 05/09/2020 0932   CHOLHDL 3.4 07/21/2022 0847   VLDL 31 07/21/2022 0847   LDLCALC 67 07/21/2022 0847   LDLCALC 108 (H) 05/09/2020 0932   LDLCALC 105 (H) 12/14/2017 0921     Assessment & Plan:   1) Controlled type 2 diabetes mellitus with  complication  Her diabetes is complicated by CAD, neuropathy, microalbuminuria, worsening CKD,  and patient remains at a high risk for more acute and chronic complications of diabetes which include CAD, CVA, CKD, retinopathy, and neuropathy. These are all discussed in detail with the patient.  She presents today with her CGM and logs showing tight profile overall.  Her POCT A1c today is 7%, improving from last visit of 7.7%.   Analysis of her CGM shows TIR 81%, TAR 19%, TBR 0% with a GMI of 7%.  She has not been  taking the Lantus, only Novolog 10 units TID with meals and her [place].  - Nutritional counseling repeated at each appointment due to patients tendency to fall back in to old habits.  - The patient admits there is a room for improvement in their diet and drink choices. -  Suggestion is made for the patient to avoid simple carbohydrates from their diet including Cakes, Sweet Desserts / Pastries, Ice Cream, Soda (diet and regular), Sweet Tea, Candies, Chips, Cookies, Sweet Pastries, Store Bought Juices, Alcohol in Excess of 1-2 drinks a day, Artificial Sweeteners, Coffee Creamer, and "Sugar-free" Products. This will help patient to have stable blood glucose profile and potentially avoid unintended weight gain.   - I encouraged the patient to switch to unprocessed or minimally processed complex starch and increased protein intake (animal or plant source), fruits, and vegetables.   - Patient is advised to stick to a routine mealtimes to eat 3 meals a day and avoid unnecessary snacks (to snack only to correct hypoglycemia).  - I have approached patient with the following individualized plan to manage diabetes and patient agrees.  She will continue to require intensive treatment with basal/bolus insulin in order for her to achieve and maintain control of diabetes to target.    -For safety purposes, Glipizide should be avoided. With her renal impairment and advanced age, Glipizide increases her  risk of hypoglycemia significantly.  Every attempt will be made to decrease her prandial insulin due to potential memory impairment which makes hypoglycemia a significant risk for her.  I did discontinue her Humalog altogether today due to memory impairment and safety risk.  -She is advised to restart Lantus at 15 units SQ nightly.  She can also continue Farxiga 5 mg po daily as recommended by her nephrologist/cardiologist.  -she is encouraged to continue monitoring blood glucose (using her CGM) at least 4 times daily, before meals and at bedtime and call the clinic if she has readings less than 70 or greater than 200 for 3 tests in a row.  - Patient specific target  for A1c; LDL, HDL, Triglycerides, and  Waist Circumference were discussed in detail.  2) BP/HTN:  Her blood pressure is controlled to target.  She is advised to continue Lasix 40 mg po twice daily, Coreg 25 mg po twice daily, Lasix 20 mg po daily, Hydralazine 100 mg po BID,  Spironolactone 25 mg daily, and Benicar 40 mg po daily.  Will defer med changes to nephrologist.  3) Lipids/HPL: Her most recent lipid panel on 07/21/22 shows controlled LDL at 67.  She is advised to continue Atorvastatin 40 mg po daily at bedtime.  Side effects and precautions discussed with her.    4)  Weight/Diet:  Her Body mass index is 32.3 kg/m. She will benefit from continued modest weight loss.  CDE consult in progress, exercise, and carbohydrates information provided.  5) Hypothyroidism: -there are no recent TFTs to review.  She is advised to continue Levothyroxine 137 mg po daily before breakfast.  Will recheck TFTs prior to next visit and adjust dose accordingly.   - We discussed about the correct intake of her thyroid hormone, on empty stomach at fasting, with water, separated by at least 30 minutes from breakfast and other medications,  and separated by more than 4 hours from calcium, iron, multivitamins, acid reflux medications (PPIs). -Patient  is made aware of the fact that thyroid hormone replacement is needed for life, dose to be adjusted by periodic monitoring of thyroid function tests.  6) Chronic Care/Health Maintenance: -Patient is on ACE and Statin medications and encouraged to continue to follow up with Ophthalmology, Podiatrist at least yearly or according to recommendations, and advised to  stay away from smoking. I have recommended yearly flu vaccine and pneumonia vaccination at least every 5 years; moderate intensity exercise for up to 150 minutes weekly; and  sleep for at least 7 hours a day.  I advised patient to maintain close follow up with her PCP for primary care needs.     I spent  44  minutes in the care of the patient today including review of labs from CMP, Lipids, Thyroid Function, Hematology (current and previous including abstractions from other facilities); face-to-face time discussing  her blood glucose readings/logs, discussing hypoglycemia and hyperglycemia episodes and symptoms, medications doses, her options of short and long term treatment based on the latest standards of care / guidelines;  discussion about incorporating lifestyle medicine;  and documenting the encounter. Risk reduction counseling performed per USPSTF guidelines to reduce obesity and cardiovascular risk factors.     Please refer to Patient Instructions for Blood Glucose Monitoring and Insulin/Medications Dosing Guide"  in media tab for additional information. Please  also refer to " Patient Self Inventory" in the Media  tab for reviewed elements of pertinent patient history.  Holly Baldwin participated in the discussions, expressed understanding, and voiced agreement with the above plans.  All questions were answered to her satisfaction. she is encouraged to contact clinic should she have any questions or concerns prior to her return visit.    Follow up plan: Return in about 4 months (around 04/12/2023) for Diabetes F/U with A1c in office,  Thyroid follow up, Previsit labs, Bring meter and logs.   Ronny Bacon, [facility] [address] [place], [place] [address] Phone: [phone] Fax: [phone]  12/10/2022, 11:27 AM

## 2022-12-11 DIAGNOSIS — Z23 Encounter for immunization: Secondary | ICD-10-CM | POA: Diagnosis not present

## 2022-12-17 ENCOUNTER — Encounter: Payer: Self-pay | Admitting: *Deleted

## 2022-12-17 ENCOUNTER — Ambulatory Visit: Payer: Self-pay | Admitting: *Deleted

## 2022-12-17 NOTE — Patient Outreach (Signed)
Care Coordination   Follow Up Visit Note   12/17/2022 Name: Holly Baldwin MRN: [id number] DOB: 1945/05/22  Holly Baldwin is a 77 y.o. year old female who sees Assunta Found, MD for primary care. I spoke with  Holly Baldwin by phone today.  What matters to the patients health and wellness today?  Continuing to manage blood sugar and blood pressure    Goals Addressed             This Visit's Progress    Blood Sugar Management   On track    Care Coordination Interventions: Patient will continue medications a prescribed Patient will keep all medical appointments Endocrinologist 04/14/22 Patient will monitor blood sugar 4 times a day and as needed with continuous glucose monitor  Patient will call endocrinologist if she has 3 consecutive readings >200 or any readings below 70 Patient will continue to follow a low sodium/heart healthy and carb modified diet 3 meals per day with 30 grams of carbohydrates and up to 2 snacks per day, if needed, with less than 15 grams of carbohydrates Patient will consider talking with dietician Patient will schedule her yearly eye exam Patient will continue to check feet daily for any cuts, sores, calluses, etc. and will call provider if she notices any changes in her feet Patient will reach out to RN Care Manager [phone] with any resource or care coordination needs     Manage Blood Pressure   On track    Care Coordination Goals: Patient will take medications as directed and report any negative side effects to provider  Patient will monitor and record blood pressure 3 times per week and as needed and will call PCP or specialist with any readings outside of recommended range Patient will keep all recommended follow-up appointments with PCP and specialists (cardiology, nephrology, etc) Patient will take blood pressure log to PCP and specialty appointments for review Patient will have log ready to discuss with RN Care Management Coordinator at next  telephone visit Patient will follow a low sodium/DASH diet with less than 2,300 mg of sodium per day Patient will reach out to RN Care Coordinator [phone] with any care coordination or resource needs          SDOH assessments and interventions completed:  Yes  SDOH Interventions Today    Flowsheet Row Most Recent Value  SDOH Interventions   Transportation Interventions Intervention Not Indicated  Physical Activity Interventions Patient Declined        Care Coordination Interventions:  Yes, provided  Interventions Today    Flowsheet Row Most Recent Value  Chronic Disease   Chronic disease during today's visit Diabetes, Hypertension (HTN)  General Interventions   General Interventions Discussed/Reviewed General Interventions Discussed, General Interventions Reviewed, Annual Eye Exam, Labs, Annual Foot Exam, Durable Medical Equipment (DME), Doctor Visits  Labs Hgb A1c every 3 months  Doctor Visits Discussed/Reviewed Doctor Visits Discussed, Doctor Visits Reviewed, Specialist, Annual Wellness Visits, PCP  Durable Medical Equipment (DME) Glucomoter, BP Cuff  [Continuous glucose meter. glucose 98 this morning. No readings less than 70.]  PCP/Specialist Visits Compliance with follow-up visit  [04/15/23 with endocrinologist]  Exercise Interventions   Exercise Discussed/Reviewed Exercise Discussed, Exercise Reviewed, Physical Activity  Physical Activity Discussed/Reviewed Physical Activity Discussed, Physical Activity Reviewed  [patient does not exercise. Encouraged to increase physical activity as tolerated with a goal of 150 minutes per wek]  Education Interventions   Education Provided Provided Education  Provided Verbal Education On Nutrition, Foot Care, Eye  Care, Labs, Blood Sugar Monitoring, When to see the doctor, Medication, Exercise  Labs Reviewed Hgb A1c  [12/10/22 A1C 7.0%]  Mental Health Interventions   Mental Health Discussed/Reviewed Mental Health Discussed, Mental  Health Reviewed  [no mental health concerns today]  Nutrition Interventions   Nutrition Discussed/Reviewed Nutrition Discussed, Nutrition Reviewed, Carbohydrate meal planning, Portion sizes, Increasing proteins, Fluid intake, Decreasing sugar intake, Adding fruits and vegetables, Decreasing salt  [low sodium diet. Eat 3 meals per day with 30 GM of CHO & up to 2 snacks per day, if needed, with less than 15 GM of CHO. Do not skip meals. Consider talking with dietician.]  Pharmacy Interventions   Pharmacy Dicussed/Reviewed Pharmacy Topics Discussed, Medications and their functions, Pharmacy Topics Reviewed  [Reviewed and discussed recent medication changes. Humalog discontinued due to memory impairment and risk for hypoglycemia. Continue Farxiga. Take Lantus 15 unites at bedtime as instructed.]  Safety Interventions   Safety Discussed/Reviewed Safety Discussed, Safety Reviewed, Fall Risk, Home Safety  Advanced Directive Interventions   Advanced Directives Discussed/Reviewed Advanced Directives Discussed, Advanced Care Planning  [Encouraged to consider]       Follow up plan: Follow up call scheduled for 01/15/23    Encounter Outcome:  Patient Visit Completed   Demetrios Loll, RN, BSN Care Management Coordinator [facility]  [facility] Direct Dial: [phone] Main #: [phone]

## 2023-01-14 DIAGNOSIS — N2581 Secondary hyperparathyroidism of renal origin: Secondary | ICD-10-CM | POA: Diagnosis not present

## 2023-01-14 DIAGNOSIS — N184 Chronic kidney disease, stage 4 (severe): Secondary | ICD-10-CM | POA: Diagnosis not present

## 2023-01-14 DIAGNOSIS — D638 Anemia in other chronic diseases classified elsewhere: Secondary | ICD-10-CM | POA: Diagnosis not present

## 2023-01-15 ENCOUNTER — Ambulatory Visit: Payer: Self-pay | Admitting: *Deleted

## 2023-01-15 NOTE — Patient Outreach (Signed)
  Care Coordination   01/15/2023 Name: Holly Baldwin MRN: [id number] DOB: 17-Jun-1945   Care Coordination Outreach Attempts:  An unsuccessful telephone outreach was attempted for a scheduled appointment today.  Follow Up Plan:  No further outreach attempts will be made at this time. Patient's Primary Care office is not partnering with the [facility] for care management and will be providing Care Management Services themselves. This final Care Management note will be securely faxed to the PCP office for handoff. Patient has been encouraged to reach out to their PCP office with any resource or care management needs.   Encounter Outcome:  No Answer   Care Coordination Interventions:  No, not indicated    Demetrios Loll, RN, BSN Care Management Coordinator [facility]  [facility] Direct Dial: [phone] Main #: [phone]

## 2023-01-20 ENCOUNTER — Ambulatory Visit: Payer: Self-pay | Admitting: *Deleted

## 2023-01-20 ENCOUNTER — Encounter: Payer: Self-pay | Admitting: *Deleted

## 2023-01-20 NOTE — Patient Outreach (Signed)
Care Coordination   Follow Up Visit Note   01/20/2023 Name: Holly Baldwin MRN: [id number] DOB: [birth year]  Holly Baldwin is a 77 y.o. year old female who sees Assunta Found, MD for primary care. I spoke with  Holly Baldwin by phone today.  What matters to the patients health and wellness today?  Ongoing management of diabetes and hypertension.   Goals Addressed             This Visit's Progress    COMPLETED: Blood Sugar Management   On track    Care Coordination Interventions: Patient will continue medications a prescribed Patient will keep all medical appointments Endocrinologist 04/14/22 Patient will monitor blood sugar 4 times a day and as needed with continuous glucose monitor  Patient will call endocrinologist if she has 3 consecutive readings >200 or any readings below 70 Patient will continue to follow a low sodium/heart healthy and carb modified diet 3 meals per day with 30 grams of carbohydrates and up to 2 snacks per day, if needed, with less than 15 grams of carbohydrates Patient will consider talking with dietician Patient will schedule her yearly eye exam Patient will continue to check feet daily for any cuts, sores, calluses, etc. and will call provider if she notices any changes in her feet   Patient will follow-up with primary care office for care management needs.     COMPLETED: Manage Blood Pressure   On track    Care Coordination Goals: Patient will take medications as directed and report any negative side effects to provider  Patient will monitor and record blood pressure 3 times per week and as needed and will call PCP or specialist with any readings outside of recommended range Patient will keep all recommended follow-up appointments with PCP and specialists (cardiology, nephrology, etc) Patient will take blood pressure log to PCP and specialty appointments for review Patient will have log ready to discuss with RN Care Management Coordinator at next telephone  visit Patient will follow a low sodium/DASH diet with less than 2,300 mg of sodium per day  Patient will follow-up with primary care office for care management needs.            SDOH assessments and interventions completed:  Yes  SDOH Interventions Today    Flowsheet Row Most Recent Value  SDOH Interventions   Transportation Interventions Intervention Not Indicated  Financial Strain Interventions Intervention Not Indicated        Care Coordination Interventions:  Yes, provided  Interventions Today    Flowsheet Row Most Recent Value  Chronic Disease   Chronic disease during today's visit Diabetes, Hypertension (HTN)  General Interventions   General Interventions Discussed/Reviewed General Interventions Discussed, General Interventions Reviewed, Annual Eye Exam, Labs, Annual Foot Exam, Lipid Profile, Durable Medical Equipment (DME), Doctor Visits  Labs Hgb A1c every 3 months, Kidney Function  Doctor Visits Discussed/Reviewed Doctor Visits Discussed, Doctor Visits Reviewed, Annual Wellness Visits, PCP, Specialist  Durable Medical Equipment (DME) BP Cuff, Glucomoter  [Patient reports that blood sugar reading was good this morning but couldn't recall the readings. Reports that blood pressure is good as well. Controlled at last office visit.]  PCP/Specialist Visits Compliance with follow-up visit  [follow-up with endocrinology on 04/15/23. Keep follow-up with nephrologist and PCP. Reach out to PCP with any care management needs.]  Exercise Interventions   Exercise Discussed/Reviewed Exercise Discussed, Exercise Reviewed, Physical Activity  Physical Activity Discussed/Reviewed Physical Activity Discussed, Physical Activity Reviewed  [encouraged to increase physical activity  as tolerated with a goal of 150 minute per week.]  Education Interventions   Education Provided Provided Education  Provided Verbal Education On Nutrition, Foot Care, Eye Care, Labs, Blood Sugar Monitoring,  Exercise, Medication, When to see the doctor  Labs Reviewed Hgb A1c, Kidney Function, Lipid Profile  [12/10/22 A1C 7.0%. Decreased from 7.7% on 08/06/22.]  Nutrition Interventions   Nutrition Discussed/Reviewed Nutrition Discussed, Nutrition Reviewed, Adding fruits and vegetables, Fluid intake, Carbohydrate meal planning, Portion sizes, Increasing proteins, Decreasing sugar intake  [eat 3 meals per day with 30 GM of CHO and up to 2 snacks per day, if needed, with less than 15 GM of CHO.]  Pharmacy Interventions   Pharmacy Dicussed/Reviewed Pharmacy Topics Discussed, Pharmacy Topics Reviewed, Medications and their functions  [Patient is taking medications as directed. Still taking lantus 15 units at bedtime and Farxiga.]  Safety Interventions   Safety Discussed/Reviewed Safety Discussed, Safety Reviewed, Fall Risk, Home Safety  Home Safety Assistive Devices       Follow up plan: No further intervention required. Patient's Primary Care office is not partnering with the [facility] for care management and will be providing Care Management Services themselves. This final Care Management note will be securely faxed to the PCP office for handoff. Patient has been encouraged to reach out to their PCP office with any resource or care management needs.   Encounter Outcome:  Patient Visit Completed   Demetrios Loll, RN, BSN Care Management Coordinator [facility]  [facility] Direct Dial: [phone] Main #: [phone]

## 2023-01-27 DIAGNOSIS — I5032 Chronic diastolic (congestive) heart failure: Secondary | ICD-10-CM | POA: Diagnosis not present

## 2023-01-27 DIAGNOSIS — N184 Chronic kidney disease, stage 4 (severe): Secondary | ICD-10-CM | POA: Diagnosis not present

## 2023-01-27 DIAGNOSIS — E1129 Type 2 diabetes mellitus with other diabetic kidney complication: Secondary | ICD-10-CM | POA: Diagnosis not present

## 2023-01-27 DIAGNOSIS — N2581 Secondary hyperparathyroidism of renal origin: Secondary | ICD-10-CM | POA: Diagnosis not present

## 2023-03-11 DIAGNOSIS — Z794 Long term (current) use of insulin: Secondary | ICD-10-CM | POA: Diagnosis not present

## 2023-03-11 DIAGNOSIS — E119 Type 2 diabetes mellitus without complications: Secondary | ICD-10-CM | POA: Diagnosis not present

## 2023-03-25 DIAGNOSIS — E1122 Type 2 diabetes mellitus with diabetic chronic kidney disease: Secondary | ICD-10-CM | POA: Diagnosis not present

## 2023-03-25 DIAGNOSIS — E039 Hypothyroidism, unspecified: Secondary | ICD-10-CM | POA: Diagnosis not present

## 2023-03-25 DIAGNOSIS — Z794 Long term (current) use of insulin: Secondary | ICD-10-CM | POA: Diagnosis not present

## 2023-03-25 DIAGNOSIS — N184 Chronic kidney disease, stage 4 (severe): Secondary | ICD-10-CM | POA: Diagnosis not present

## 2023-03-26 LAB — COMPREHENSIVE METABOLIC PANEL
ALT: 14 [IU]/L (ref 0–32)
AST: 18 [IU]/L (ref 0–40)
Albumin: 4 g/dL (ref 3.8–4.8)
Alkaline Phosphatase: 131 [IU]/L — ABNORMAL HIGH (ref 44–121)
BUN/Creatinine Ratio: 16 (ref 12–28)
BUN: 29 mg/dL — ABNORMAL HIGH (ref 8–27)
Bilirubin Total: 0.4 mg/dL (ref 0.0–1.2)
CO2: 23 mmol/L (ref 20–29)
Calcium: 9.5 mg/dL (ref 8.7–10.3)
Chloride: 105 mmol/L (ref 96–106)
Creatinine, Ser: 1.85 mg/dL — ABNORMAL HIGH (ref 0.57–1.00)
Globulin, Total: 2.3 g/dL (ref 1.5–4.5)
Glucose: 128 mg/dL — ABNORMAL HIGH (ref 70–99)
Potassium: 4.8 mmol/L (ref 3.5–5.2)
Sodium: 145 mmol/L — ABNORMAL HIGH (ref 134–144)
Total Protein: 6.3 g/dL (ref 6.0–8.5)
eGFR: 28 mL/min/{1.73_m2} — ABNORMAL LOW (ref >59)

## 2023-03-26 LAB — T4, FREE: Free T4: 3.07 ng/dL — ABNORMAL HIGH (ref 0.82–1.77)

## 2023-03-26 LAB — TSH: TSH: <0.005 u[IU]/mL — ABNORMAL LOW (ref 0.450–4.500)

## 2023-04-15 ENCOUNTER — Other Ambulatory Visit: Payer: Self-pay | Admitting: Nurse Practitioner

## 2023-04-15 ENCOUNTER — Ambulatory Visit: Payer: 59 | Admitting: Nurse Practitioner

## 2023-04-21 DIAGNOSIS — E211 Secondary hyperparathyroidism, not elsewhere classified: Secondary | ICD-10-CM | POA: Diagnosis not present

## 2023-04-21 DIAGNOSIS — R809 Proteinuria, unspecified: Secondary | ICD-10-CM | POA: Diagnosis not present

## 2023-04-21 DIAGNOSIS — D631 Anemia in chronic kidney disease: Secondary | ICD-10-CM | POA: Diagnosis not present

## 2023-04-21 DIAGNOSIS — N189 Chronic kidney disease, unspecified: Secondary | ICD-10-CM | POA: Diagnosis not present

## 2023-04-27 ENCOUNTER — Other Ambulatory Visit (HOSPITAL_COMMUNITY): Payer: Self-pay | Admitting: Family Medicine

## 2023-04-27 DIAGNOSIS — Z1231 Encounter for screening mammogram for malignant neoplasm of breast: Secondary | ICD-10-CM

## 2023-05-01 ENCOUNTER — Ambulatory Visit (INDEPENDENT_AMBULATORY_CARE_PROVIDER_SITE_OTHER): Payer: 59 | Admitting: Nurse Practitioner

## 2023-05-01 ENCOUNTER — Encounter: Payer: Self-pay | Admitting: Nurse Practitioner

## 2023-05-01 VITALS — BP 120/58 | HR 67 | Ht 64.0 in | Wt 178.4 lb

## 2023-05-01 DIAGNOSIS — E1122 Type 2 diabetes mellitus with diabetic chronic kidney disease: Secondary | ICD-10-CM

## 2023-05-01 DIAGNOSIS — E782 Mixed hyperlipidemia: Secondary | ICD-10-CM

## 2023-05-01 DIAGNOSIS — Z7984 Long term (current) use of oral hypoglycemic drugs: Secondary | ICD-10-CM | POA: Diagnosis not present

## 2023-05-01 DIAGNOSIS — I10 Essential (primary) hypertension: Secondary | ICD-10-CM

## 2023-05-01 DIAGNOSIS — E039 Hypothyroidism, unspecified: Secondary | ICD-10-CM | POA: Diagnosis not present

## 2023-05-01 DIAGNOSIS — Z794 Long term (current) use of insulin: Secondary | ICD-10-CM | POA: Diagnosis not present

## 2023-05-01 DIAGNOSIS — N184 Chronic kidney disease, stage 4 (severe): Secondary | ICD-10-CM | POA: Diagnosis not present

## 2023-05-01 LAB — POCT GLYCOSYLATED HEMOGLOBIN (HGB A1C): Hemoglobin A1C: 7 % — AB (ref 4.0–5.6)

## 2023-05-01 MED ORDER — LEVOTHYROXINE SODIUM 112 MCG PO TABS: 112.0000 ug | ORAL_TABLET | Freq: Every day | ORAL | 1 refills | Status: DC

## 2023-05-01 MED ORDER — DAPAGLIFLOZIN PROPANEDIOL 5 MG PO TABS: 5.0000 mg | ORAL_TABLET | Freq: Every day | ORAL | 1 refills | Status: DC

## 2023-05-01 NOTE — Progress Notes (Signed)
 05/01/2023      Endocrinology follow-up note   Subjective:    Patient ID: Holly Baldwin, female    DOB: 1945-03-04,    Past Medical History:  Diagnosis Date   Anxiety    Chronic abdominal pain    Diabetes mellitus    GERD (gastroesophageal reflux disease)    Hiatal hernia    Hypercholesteremia    Hypertension    Past Surgical History:  Procedure Laterality Date   ABDOMINAL HYSTERECTOMY     CATARACT EXTRACTION W/PHACO Right 01/09/2020   Procedure: CATARACT EXTRACTION PHACO AND INTRAOCULAR LENS PLACEMENT (IOC);  Surgeon: Fabio Pierce, MD;  Location: [facility];  Service: Ophthalmology;  Laterality: Right;  CDE: 12.01   CATARACT EXTRACTION W/PHACO Left 01/23/2020   Procedure: CATARACT EXTRACTION PHACO AND INTRAOCULAR LENS PLACEMENT LEFT EYE;  Surgeon: Fabio Pierce, MD;  Location: [facility];  Service: Ophthalmology;  Laterality: Left;  CDE 8.71   COLONOSCOPY N/A 06/23/2012   Procedure: COLONOSCOPY;  Surgeon: Malissa Hippo, MD;  Location: [facility];  Service: Endoscopy;  Laterality: N/A;  100-moved to 1200 Ann to notify pt   ESOPHAGOGASTRODUODENOSCOPY N/A 05/06/2012   Procedure: ESOPHAGOGASTRODUODENOSCOPY (EGD);  Surgeon: Malissa Hippo, MD;  Location: [facility];  Service: Endoscopy;  Laterality: N/A;   MASS EXCISION Right 05/26/2012   Procedure: EXCISION NEOPLASM RIGHT THIGH;  Surgeon: Dalia Heading, MD;  Location: [facility];  Service: General;  Laterality: Right;   Social History   Socioeconomic History   Marital status: Married    Spouse name: Not on file   Number of children: Not on file   Years of education: Not on file   Highest education level: Not on file  Occupational History   Not on file  Tobacco Use   Smoking status: Never   Smokeless tobacco: Never  Vaping Use   Vaping status: Never Used  Substance and Sexual Activity   Alcohol use: No   Drug use: No   Sexual activity: Not Currently    Birth control/protection: Post-menopausal  Other Topics Concern    Not on file  Social History Narrative   Not on file   Social Drivers of Health   Financial Resource Strain: Low Risk  (01/20/2023)   Overall Financial Resource Strain (CARDIA)    Difficulty of Paying Living Expenses: Not very hard  Food Insecurity: No Food Insecurity (11/13/2021)   Hunger Vital Sign    Worried About Running Out of Food in the Last Year: Never true    Ran Out of Food in the Last Year: Never true  Transportation Needs: No Transportation Needs (01/20/2023)   PRAPARE - Administrator, Civil Service (Medical): No    Lack of Transportation (Non-Medical): No  Physical Activity: Inactive (12/17/2022)   Exercise Vital Sign    Days of Exercise per Week: 0 days    Minutes of Exercise per Session: 0 min  Stress: No Stress Concern Present (05/07/2020)   [employer] of Occupational Health - Occupational Stress Questionnaire    Feeling of Stress : Not at all  Social Connections: Socially Isolated (05/07/2020)   Social Connection and Isolation Panel [NHANES]    Frequency of Communication with Friends and Family: Once a week    Frequency of Social Gatherings with Friends and Family: Never    Attends Religious Services: Never    Database administrator or Organizations: No    Attends Banker Meetings: Never    Marital Status: Married  Outpatient Encounter Medications as of 05/01/2023  Medication Sig   albuterol (PROVENTIL HFA;VENTOLIN HFA) 108 (90 Base) MCG/ACT inhaler Inhale 1-2 puffs into the lungs every 6 (six) hours as needed for wheezing or shortness of breath.    calcitRIOL (ROCALTROL) 0.25 MCG capsule Take 0.25 mcg by mouth every Monday, Wednesday, and Friday.   clopidogrel (PLAVIX) 75 MG tablet Take 75 mg by mouth daily.   escitalopram (LEXAPRO) 10 MG tablet Take 10 mg by mouth daily.   Finerenone (KERENDIA) 10 MG TABS Take by mouth daily.   furosemide (LASIX) 20 MG tablet Take 20 mg by mouth 2 (two) times daily.   hydrALAZINE (APRESOLINE) 50 MG  tablet Take 50 mg by mouth 2 (two) times daily.   insulin glargine (LANTUS SOLOSTAR) 100 UNIT/ML Solostar Pen Inject 15 Units into the skin at bedtime.   Insulin Pen Needle (PEN NEEDLES) 31G X 8 MM MISC Use to take insulin 1 time a day   isosorbide mononitrate (IMDUR) 60 MG 24 hr tablet Take 1 tablet (60 mg total) by mouth daily.   levocetirizine (XYZAL) 5 MG tablet Take 5 mg by mouth every evening.   Magnesium 400 MG TABS Take by mouth.   Multiple Vitamins-Minerals (PRESERVISION AREDS 2) CAPS Take by mouth 2 (two) times daily.   NIFEdipine (PROCARDIA XL/NIFEDICAL-XL) 90 MG 24 hr tablet Take 90 mg by mouth daily.   olmesartan (BENICAR) 40 MG tablet Take 40 mg by mouth daily.   oxyCODONE (ROXICODONE) 5 MG immediate release tablet Take 0.5 tablets (2.5 mg total) by mouth every 6 (six) hours as needed.   simvastatin (ZOCOR) 40 MG tablet TAKE 1 TABLET EVERY DAY (Patient taking differently: Take 40 mg by mouth daily.)   [DISCONTINUED] dapagliflozin propanediol (FARXIGA) 5 MG TABS tablet Take by mouth daily.   [DISCONTINUED] levothyroxine (SYNTHROID) 137 MCG tablet Take 1 tablet (137 mcg total) by mouth daily before breakfast.   dapagliflozin propanediol (FARXIGA) 5 MG TABS tablet Take 1 tablet (5 mg total) by mouth daily.   levothyroxine (SYNTHROID) 112 MCG tablet Take 1 tablet (112 mcg total) by mouth daily before breakfast.   potassium chloride SA (KLOR-CON M) 20 MEQ tablet Take 20 mEq by mouth daily. (Patient not taking: Reported on 05/01/2023)   RESTASIS 0.05 % ophthalmic emulsion 1 drop 2 (two) times daily. (Patient not taking: Reported on 05/01/2023)   No facility-administered encounter medications on file as of 05/01/2023.   ALLERGIES: Allergies  Allergen Reactions   Bee Venom Anaphylaxis   Penicillins Hives    .Has patient had a PCN reaction causing immediate rash, facial/tongue/throat swelling, SOB or lightheadedness with hypotension: Yes Has patient had a PCN reaction causing severe rash  involving mucus membranes or skin necrosis: No Has patient had a PCN reaction that required hospitalization: Yes Has patient had a PCN reaction occurring within the last 10 years: No If all of the above answers are "NO", then may proceed with Cephalosporin use.  .Has patient had a PCN reaction causing immediate rash, facial/tongue/throat swelling, SOB or lightheadedness with hypotension: Yes Has patient had a PCN reaction causing severe rash involving mucus membranes or skin necrosis: No Has patient had a PCN reaction that required hospitalization: Yes Has patient had a PCN reaction occurring within the last 10 years: No If all of the above answers are "NO", then may proceed with Cephalosporin use. .Has patient had a PCN reaction causing immediate rash, facial/tongue/throat swelling, SOB or lightheadedness with hypotension: Yes Has patient had a PCN reaction causing  severe rash involving mucus membranes or skin necrosis: No Has patient had a PCN reaction that required hospitalization: Yes Has patient had a PCN reaction occurring within the last 10 years: No If all of the above answers are "NO", then may proceed with Cephalosporin use.   VACCINATION STATUS:  There is no immunization history on file for this patient.  Diabetes She presents for her follow-up diabetic visit. She has type 2 diabetes mellitus. Onset time: she was diagnosed at approximate age of 49 years. Her disease course has been stable. There are no hypoglycemic associated symptoms. Pertinent negatives for hypoglycemia include no confusion, headaches, nervousness/anxiousness, pallor, seizures or tremors. Pertinent negatives for diabetes include no chest pain, no fatigue, no polydipsia, no polyphagia, no polyuria and no weight loss. There are no hypoglycemic complications. Symptoms are stable. Diabetic complications include nephropathy. Risk factors for coronary artery disease include diabetes mellitus, dyslipidemia, obesity,  sedentary lifestyle and hypertension. Current diabetic treatment includes intensive insulin program and oral agent (monotherapy). She is compliant with treatment most of the time. Her weight is decreasing steadily. She is following a generally healthy diet. When asked about meal planning, she reported none. She has not had a previous visit with a dietitian. She never participates in exercise. Her overall blood glucose range is 140-180 mg/dl. (She presents today with her logs, no meter, no CGM (Says her CGM receiver broke is waiting on new one from the company).  Her POCT A1c today is 7%, unchanged from last visit.  She denies any significant hypoglycemia.) An ACE inhibitor/angiotensin II receptor blocker is being taken. She does not see a podiatrist.Eye exam is current.  Thyroid Problem Presents for follow-up (She is currently on levothyroxine 137 mcg p.o. nightly.  She reports compliance with medication.) visit. Patient reports no anxiety, cold intolerance, constipation, depressed mood, diarrhea, fatigue, heat intolerance, palpitations, tremors, weight gain or weight loss. The symptoms have been stable. Her past medical history is significant for diabetes.  Hyperlipidemia This is a chronic problem. The current episode started more than 1 year ago. The problem is controlled. Recent lipid tests were reviewed and are normal. Exacerbating diseases include chronic renal disease, diabetes and obesity. Factors aggravating her hyperlipidemia include beta blockers and fatty foods. Pertinent negatives include no chest pain, myalgias or shortness of breath. Current antihyperlipidemic treatment includes statins. The current treatment provides mild improvement of lipids. Compliance problems include adherence to diet and adherence to exercise.  Risk factors for coronary artery disease include dyslipidemia, diabetes mellitus, hypertension, obesity, a sedentary lifestyle and post-menopausal.  Hypertension This is a chronic  problem. The current episode started more than 1 year ago. The problem has been resolved since onset. The problem is controlled. Pertinent negatives include no chest pain, headaches, palpitations or shortness of breath. Agents associated with hypertension include thyroid hormones. Risk factors for coronary artery disease include dyslipidemia, diabetes mellitus, obesity, post-menopausal state and sedentary lifestyle. Past treatments include calcium channel blockers, central alpha agonists and diuretics. The current treatment provides mild improvement. Compliance problems include exercise and diet.  Hypertensive end-organ damage includes kidney disease. Identifiable causes of hypertension include chronic renal disease and a thyroid problem.     Review of systems  Constitutional: + decreasing body weight,  current Body mass index is 30.62 kg/m. , no fatigue, no subjective hyperthermia, no subjective hypothermia Eyes: no blurry vision, no xerophthalmia ENT: no sore throat, no nodules palpated in throat, no dysphagia/odynophagia, no hoarseness Cardiovascular: no chest pain, no shortness of breath, no palpitations, no  leg swelling Respiratory: no cough, no shortness of breath Gastrointestinal: no nausea/vomiting/diarrhea Musculoskeletal: no muscle/joint aches Skin: no rashes, no hyperemia Neurological: no tremors, no numbness, no tingling, no dizziness Psychiatric: no depression, no anxiety   Objective:    BP (!) 120/58 (BP Location: Left Arm, Patient Position: Sitting, Cuff Size: Large)   Pulse 67   Ht 5\' 4"  (1.626 m)   Wt 178 lb 6.4 oz (80.9 kg)   BMI 30.62 kg/m   Wt Readings from Last 3 Encounters:  05/01/23 178 lb 6.4 oz (80.9 kg)  12/10/22 188 lb 3.2 oz (85.4 kg)  08/06/22 197 lb 3.2 oz (89.4 kg)    BP Readings from Last 3 Encounters:  05/01/23 (!) 120/58  12/10/22 121/75  08/06/22 113/64     Physical Exam- Limited  Constitutional:  Body mass index is 30.62 kg/m. , not in  acute distress, ? Memory impairment Eyes:  EOMI, no exophthalmos Musculoskeletal: no gross deformities, strength intact in all four extremities, no gross restriction of joint movements Skin:  no rashes, no hyperemia Neurological: no tremor with outstretched hands   Diabetic Foot Exam - Simple   No data filed     Results for orders placed or performed in visit on 05/01/23  HgB A1c   Collection Time: 05/01/23 10:49 AM  Result Value Ref Range   Hemoglobin A1C 7.0 (A) 4.0 - 5.6 %   HbA1c POC (<> result, manual entry)     HbA1c, POC (prediabetic range)     HbA1c, POC (controlled diabetic range)     Diabetic Labs (most recent): Lab Results  Component Value Date   HGBA1C 7.0 (A) 05/01/2023   HGBA1C 7.0 (A) 12/10/2022   HGBA1C 7.7 (A) 08/06/2022   MICROALBUR 172.4 12/14/2017   MICROALBUR >600.0 03/09/2017   MICROALBUR 10.7 01/23/2016   Lipid Panel     Component Value Date/Time   CHOL 139 07/21/2022 0847   CHOL 184 05/09/2020 0932   TRIG 155 (H) 07/21/2022 0847   HDL 41 07/21/2022 0847   HDL 42 05/09/2020 0932   CHOLHDL 3.4 07/21/2022 0847   VLDL 31 07/21/2022 0847   LDLCALC 67 07/21/2022 0847   LDLCALC 108 (H) 05/09/2020 0932   LDLCALC 105 (H) 12/14/2017 0921     Assessment & Plan:   1) Controlled type 2 diabetes mellitus with complication  Her diabetes is complicated by CAD, neuropathy, microalbuminuria, worsening CKD,  and patient remains at a high risk for more acute and chronic complications of diabetes which include CAD, CVA, CKD, retinopathy, and neuropathy. These are all discussed in detail with the patient.  She presents today with her logs, no meter, no CGM (Says her CGM receiver broke is waiting on new one from the company).  Her POCT A1c today is 7%, unchanged from last visit.  She denies any significant hypoglycemia.  - Nutritional counseling repeated at each appointment due to patients tendency to fall back in to old habits.  - The patient admits there is  a room for improvement in their diet and drink choices. -  Suggestion is made for the patient to avoid simple carbohydrates from their diet including Cakes, Sweet Desserts / Pastries, Ice Cream, Soda (diet and regular), Sweet Tea, Candies, Chips, Cookies, Sweet Pastries, Store Bought Juices, Alcohol in Excess of 1-2 drinks a day, Artificial Sweeteners, Coffee Creamer, and "Sugar-free" Products. This will help patient to have stable blood glucose profile and potentially avoid unintended weight gain.   - I encouraged the patient to  switch to unprocessed or minimally processed complex starch and increased protein intake (animal or plant source), fruits, and vegetables.   - Patient is advised to stick to a routine mealtimes to eat 3 meals a day and avoid unnecessary snacks (to snack only to correct hypoglycemia).  - I have approached patient with the following individualized plan to manage diabetes and patient agrees.  She will continue to require intensive treatment with basal/bolus insulin in order for her to achieve and maintain control of diabetes to target.    -For safety purposes, Glipizide should be avoided. With her renal impairment and advanced age, Glipizide increases her risk of hypoglycemia significantly.  Every attempt will be made to decrease her prandial insulin due to potential memory impairment which makes hypoglycemia a significant risk for her.    -She is advised to continue Lantus 15 units SQ nightly and Farxiga 5 mg po daily as recommended by her nephrologist/cardiologist.  -she is encouraged to continue monitoring blood glucose (using her CGM) at least 4 times daily, before meals and at bedtime and call the clinic if she has readings less than 70 or greater than 200 for 3 tests in a row.  - Patient specific target  for A1c; LDL, HDL, Triglycerides, and  Waist Circumference were discussed in detail.  2) BP/HTN:  Her blood pressure is controlled to target.  She is advised to  continue Lasix 40 mg po twice daily, Coreg 25 mg po twice daily, Lasix 20 mg po daily, Hydralazine 100 mg po BID,  Spironolactone 25 mg daily, and Benicar 40 mg po daily.  Will defer med changes to nephrologist.  3) Lipids/HPL: Her most recent lipid panel on 07/21/22 shows controlled LDL at 67.  She is advised to continue Atorvastatin 40 mg po daily at bedtime.  Side effects and precautions discussed with her.    4)  Weight/Diet:  Her Body mass index is 30.62 kg/m. She will benefit from continued modest weight loss.  CDE consult in progress, exercise, and carbohydrates information provided.  5) Hypothyroidism: -Her previsit TFTs are consistent with over-replacement and she has lost significant amount of weight.  She is advised to lower Levothyroxine to 112 mg po daily before breakfast.  Will recheck TFTs prior to next visit and adjust dose accordingly.   - We discussed about the correct intake of her thyroid hormone, on empty stomach at fasting, with water, separated by at least 30 minutes from breakfast and other medications,  and separated by more than 4 hours from calcium, iron, multivitamins, acid reflux medications (PPIs). -Patient is made aware of the fact that thyroid hormone replacement is needed for life, dose to be adjusted by periodic monitoring of thyroid function tests.  6) Chronic Care/Health Maintenance: -Patient is on ACE and Statin medications and encouraged to continue to follow up with Ophthalmology, Podiatrist at least yearly or according to recommendations, and advised to  stay away from smoking. I have recommended yearly flu vaccine and pneumonia vaccination at least every 5 years; moderate intensity exercise for up to 150 minutes weekly; and  sleep for at least 7 hours a day.  I advised patient to maintain close follow up with her PCP for primary care needs.     I spent  22  minutes in the care of the patient today including review of labs from CMP, Lipids, Thyroid  Function, Hematology (current and previous including abstractions from other facilities); face-to-face time discussing  her blood glucose readings/logs, discussing hypoglycemia and hyperglycemia episodes  and symptoms, medications doses, her options of short and long term treatment based on the latest standards of care / guidelines;  discussion about incorporating lifestyle medicine;  and documenting the encounter. Risk reduction counseling performed per USPSTF guidelines to reduce obesity and cardiovascular risk factors.     Please refer to Patient Instructions for Blood Glucose Monitoring and Insulin/Medications Dosing Guide"  in media tab for additional information. Please  also refer to " Patient Self Inventory" in the Media  tab for reviewed elements of pertinent patient history.  Holly Baldwin participated in the discussions, expressed understanding, and voiced agreement with the above plans.  All questions were answered to her satisfaction. she is encouraged to contact clinic should she have any questions or concerns prior to her return visit.    Follow up plan: Return in about 4 months (around 08/29/2023) for Diabetes F/U with A1c in office, Thyroid follow up, Previsit labs, Bring meter and logs.   Ronny Bacon, [facility] [address] [place], [place] [address] Phone: [phone] Fax: [phone]  05/01/2023, 10:57 AM

## 2023-05-04 ENCOUNTER — Telehealth: Payer: Self-pay | Admitting: Nurse Practitioner

## 2023-05-04 NOTE — Telephone Encounter (Signed)
 Pt called and stated the thyroid pills you called in make her real dizzy and sick and would like a call back

## 2023-05-04 NOTE — Telephone Encounter (Signed)
 I lowered her dose as she was SIGNIFICANTLY over-replaced.  It should help with the dizziness over-time.  Please make sure she has not been taking that plus her other Levothyroxine pill at the same time.

## 2023-05-04 NOTE — Telephone Encounter (Signed)
 Patient was called and made aware.

## 2023-05-06 DIAGNOSIS — N184 Chronic kidney disease, stage 4 (severe): Secondary | ICD-10-CM | POA: Diagnosis not present

## 2023-05-06 DIAGNOSIS — I5032 Chronic diastolic (congestive) heart failure: Secondary | ICD-10-CM | POA: Diagnosis not present

## 2023-05-06 DIAGNOSIS — E1129 Type 2 diabetes mellitus with other diabetic kidney complication: Secondary | ICD-10-CM | POA: Diagnosis not present

## 2023-05-06 DIAGNOSIS — N2581 Secondary hyperparathyroidism of renal origin: Secondary | ICD-10-CM | POA: Diagnosis not present

## 2023-05-11 DIAGNOSIS — E039 Hypothyroidism, unspecified: Secondary | ICD-10-CM | POA: Diagnosis not present

## 2023-05-11 DIAGNOSIS — J4540 Moderate persistent asthma, uncomplicated: Secondary | ICD-10-CM | POA: Diagnosis not present

## 2023-05-11 DIAGNOSIS — I10 Essential (primary) hypertension: Secondary | ICD-10-CM | POA: Diagnosis not present

## 2023-05-11 DIAGNOSIS — M160 Bilateral primary osteoarthritis of hip: Secondary | ICD-10-CM | POA: Diagnosis not present

## 2023-05-11 DIAGNOSIS — J309 Allergic rhinitis, unspecified: Secondary | ICD-10-CM | POA: Diagnosis not present

## 2023-06-04 ENCOUNTER — Other Ambulatory Visit: Payer: Self-pay | Admitting: Nurse Practitioner

## 2023-06-10 ENCOUNTER — Ambulatory Visit (HOSPITAL_COMMUNITY)
Admission: RE | Admit: 2023-06-10 | Discharge: 2023-06-10 | Disposition: A | Discharge status: Home / Self Care | Payer: 59 | Source: Ambulatory Visit | Attending: Family Medicine | Admitting: Family Medicine

## 2023-06-10 DIAGNOSIS — E119 Type 2 diabetes mellitus without complications: Secondary | ICD-10-CM | POA: Diagnosis not present

## 2023-06-10 DIAGNOSIS — Z794 Long term (current) use of insulin: Secondary | ICD-10-CM | POA: Diagnosis not present

## 2023-06-10 DIAGNOSIS — Z1231 Encounter for screening mammogram for malignant neoplasm of breast: Secondary | ICD-10-CM | POA: Diagnosis not present

## 2023-07-17 ENCOUNTER — Other Ambulatory Visit: Payer: Self-pay

## 2023-07-17 ENCOUNTER — Inpatient Hospital Stay (HOSPITAL_COMMUNITY)
Admission: EM | Admit: 2023-07-17 | Discharge: 2023-07-20 | DRG: 291 | Disposition: A | Discharge status: Home Health | Attending: Internal Medicine | Admitting: Internal Medicine

## 2023-07-17 ENCOUNTER — Encounter (HOSPITAL_COMMUNITY): Payer: Self-pay

## 2023-07-17 ENCOUNTER — Other Ambulatory Visit (HOSPITAL_COMMUNITY): Payer: Self-pay | Admitting: *Deleted

## 2023-07-17 ENCOUNTER — Inpatient Hospital Stay (HOSPITAL_COMMUNITY)

## 2023-07-17 ENCOUNTER — Emergency Department (HOSPITAL_COMMUNITY)

## 2023-07-17 DIAGNOSIS — I5033 Acute on chronic diastolic (congestive) heart failure: Secondary | ICD-10-CM | POA: Diagnosis not present

## 2023-07-17 DIAGNOSIS — R9431 Abnormal electrocardiogram [ECG] [EKG]: Secondary | ICD-10-CM | POA: Diagnosis not present

## 2023-07-17 DIAGNOSIS — Z833 Family history of diabetes mellitus: Secondary | ICD-10-CM | POA: Diagnosis not present

## 2023-07-17 DIAGNOSIS — E876 Hypokalemia: Secondary | ICD-10-CM | POA: Diagnosis not present

## 2023-07-17 DIAGNOSIS — Z8673 Personal history of transient ischemic attack (TIA), and cerebral infarction without residual deficits: Secondary | ICD-10-CM | POA: Diagnosis not present

## 2023-07-17 DIAGNOSIS — I5043 Acute on chronic combined systolic (congestive) and diastolic (congestive) heart failure: Secondary | ICD-10-CM | POA: Diagnosis not present

## 2023-07-17 DIAGNOSIS — I10 Essential (primary) hypertension: Secondary | ICD-10-CM | POA: Diagnosis not present

## 2023-07-17 DIAGNOSIS — E039 Hypothyroidism, unspecified: Secondary | ICD-10-CM | POA: Diagnosis not present

## 2023-07-17 DIAGNOSIS — I4891 Unspecified atrial fibrillation: Secondary | ICD-10-CM | POA: Diagnosis not present

## 2023-07-17 DIAGNOSIS — J9601 Acute respiratory failure with hypoxia: Secondary | ICD-10-CM | POA: Diagnosis present

## 2023-07-17 DIAGNOSIS — I110 Hypertensive heart disease with heart failure: Secondary | ICD-10-CM | POA: Diagnosis not present

## 2023-07-17 DIAGNOSIS — Z880 Allergy status to penicillin: Secondary | ICD-10-CM

## 2023-07-17 DIAGNOSIS — R918 Other nonspecific abnormal finding of lung field: Secondary | ICD-10-CM | POA: Diagnosis not present

## 2023-07-17 DIAGNOSIS — K219 Gastro-esophageal reflux disease without esophagitis: Secondary | ICD-10-CM | POA: Diagnosis present

## 2023-07-17 DIAGNOSIS — R059 Cough, unspecified: Secondary | ICD-10-CM | POA: Diagnosis present

## 2023-07-17 DIAGNOSIS — J90 Pleural effusion, not elsewhere classified: Secondary | ICD-10-CM | POA: Diagnosis not present

## 2023-07-17 DIAGNOSIS — I509 Heart failure, unspecified: Secondary | ICD-10-CM | POA: Diagnosis not present

## 2023-07-17 DIAGNOSIS — Z6830 Body mass index (BMI) 30.0-30.9, adult: Secondary | ICD-10-CM | POA: Diagnosis not present

## 2023-07-17 DIAGNOSIS — N184 Chronic kidney disease, stage 4 (severe): Secondary | ICD-10-CM | POA: Diagnosis not present

## 2023-07-17 DIAGNOSIS — E1122 Type 2 diabetes mellitus with diabetic chronic kidney disease: Secondary | ICD-10-CM | POA: Diagnosis not present

## 2023-07-17 DIAGNOSIS — Z7902 Long term (current) use of antithrombotics/antiplatelets: Secondary | ICD-10-CM

## 2023-07-17 DIAGNOSIS — Z79890 Hormone replacement therapy (postmenopausal): Secondary | ICD-10-CM | POA: Diagnosis not present

## 2023-07-17 DIAGNOSIS — D509 Iron deficiency anemia, unspecified: Secondary | ICD-10-CM

## 2023-07-17 DIAGNOSIS — I130 Hypertensive heart and chronic kidney disease with heart failure and stage 1 through stage 4 chronic kidney disease, or unspecified chronic kidney disease: Principal | ICD-10-CM | POA: Diagnosis present

## 2023-07-17 DIAGNOSIS — Z794 Long term (current) use of insulin: Secondary | ICD-10-CM

## 2023-07-17 DIAGNOSIS — I5021 Acute systolic (congestive) heart failure: Secondary | ICD-10-CM | POA: Diagnosis not present

## 2023-07-17 DIAGNOSIS — I517 Cardiomegaly: Secondary | ICD-10-CM | POA: Diagnosis not present

## 2023-07-17 DIAGNOSIS — E782 Mixed hyperlipidemia: Secondary | ICD-10-CM

## 2023-07-17 DIAGNOSIS — Z90710 Acquired absence of both cervix and uterus: Secondary | ICD-10-CM

## 2023-07-17 DIAGNOSIS — Z823 Family history of stroke: Secondary | ICD-10-CM | POA: Diagnosis not present

## 2023-07-17 DIAGNOSIS — R7989 Other specified abnormal findings of blood chemistry: Secondary | ICD-10-CM

## 2023-07-17 DIAGNOSIS — I700 Atherosclerosis of aorta: Secondary | ICD-10-CM | POA: Diagnosis not present

## 2023-07-17 DIAGNOSIS — I272 Pulmonary hypertension, unspecified: Secondary | ICD-10-CM | POA: Diagnosis present

## 2023-07-17 DIAGNOSIS — I447 Left bundle-branch block, unspecified: Secondary | ICD-10-CM | POA: Diagnosis present

## 2023-07-17 DIAGNOSIS — Z79899 Other long term (current) drug therapy: Secondary | ICD-10-CM | POA: Diagnosis not present

## 2023-07-17 DIAGNOSIS — I429 Cardiomyopathy, unspecified: Secondary | ICD-10-CM | POA: Diagnosis not present

## 2023-07-17 DIAGNOSIS — E1165 Type 2 diabetes mellitus with hyperglycemia: Secondary | ICD-10-CM | POA: Insufficient documentation

## 2023-07-17 DIAGNOSIS — E66811 Obesity, class 1: Secondary | ICD-10-CM | POA: Diagnosis present

## 2023-07-17 DIAGNOSIS — Z91030 Bee allergy status: Secondary | ICD-10-CM

## 2023-07-17 DIAGNOSIS — F419 Anxiety disorder, unspecified: Secondary | ICD-10-CM | POA: Diagnosis present

## 2023-07-17 LAB — CBC WITH DIFFERENTIAL/PLATELET
Abs Immature Granulocytes: 0.04 10*3/uL (ref 0.00–0.07)
Basophils Absolute: 0 10*3/uL (ref 0.0–0.1)
Basophils Relative: 0 %
Eosinophils Absolute: 0 10*3/uL (ref 0.0–0.5)
Eosinophils Relative: 0 %
HCT: 31.6 % — ABNORMAL LOW (ref 36.0–46.0)
Hemoglobin: 10 g/dL — ABNORMAL LOW (ref 12.0–15.0)
Immature Granulocytes: 0 %
Lymphocytes Relative: 5 %
Lymphs Abs: 0.6 10*3/uL — ABNORMAL LOW (ref 0.7–4.0)
MCH: 24.2 pg — ABNORMAL LOW (ref 26.0–34.0)
MCHC: 31.6 g/dL (ref 30.0–36.0)
MCV: 76.3 fL — ABNORMAL LOW (ref 80.0–100.0)
Monocytes Absolute: 1 10*3/uL (ref 0.1–1.0)
Monocytes Relative: 9 %
Neutro Abs: 10 10*3/uL — ABNORMAL HIGH (ref 1.7–7.7)
Neutrophils Relative %: 86 %
Platelets: 272 10*3/uL (ref 150–400)
RBC: 4.14 MIL/uL (ref 3.87–5.11)
RDW: 15.5 % (ref 11.5–15.5)
WBC: 11.8 10*3/uL — ABNORMAL HIGH (ref 4.0–10.5)
nRBC: 0 % (ref 0.0–0.2)

## 2023-07-17 LAB — IRON AND TIBC
Iron: 22 ug/dL — ABNORMAL LOW (ref 28–170)
Saturation Ratios: 6 % — ABNORMAL LOW (ref 10.4–31.8)
TIBC: 344 ug/dL (ref 250–450)
UIBC: 322 ug/dL

## 2023-07-17 LAB — COMPREHENSIVE METABOLIC PANEL WITH GFR
ALT: 13 U/L (ref 0–44)
AST: 14 U/L — ABNORMAL LOW (ref 15–41)
Albumin: 3.6 g/dL (ref 3.5–5.0)
Alkaline Phosphatase: 98 U/L (ref 38–126)
Anion gap: 10 (ref 5–15)
BUN: 36 mg/dL — ABNORMAL HIGH (ref 8–23)
CO2: 27 mmol/L (ref 22–32)
Calcium: 9.1 mg/dL (ref 8.9–10.3)
Chloride: 99 mmol/L (ref 98–111)
Creatinine, Ser: 2.14 mg/dL — ABNORMAL HIGH (ref 0.44–1.00)
GFR, Estimated: 23 mL/min — ABNORMAL LOW (ref >60)
Glucose, Bld: 186 mg/dL — ABNORMAL HIGH (ref 70–99)
Potassium: 4.5 mmol/L (ref 3.5–5.1)
Sodium: 136 mmol/L (ref 135–145)
Total Bilirubin: 0.8 mg/dL (ref 0.0–1.2)
Total Protein: 6.8 g/dL (ref 6.5–8.1)

## 2023-07-17 LAB — ECHOCARDIOGRAM COMPLETE
AR max vel: 3.3 cm2
AV Area VTI: 2.86 cm2
AV Area mean vel: 2.98 cm2
AV Mean grad: 4 mmHg
AV Peak grad: 8.7 mmHg
Ao pk vel: 1.47 m/s
Area-P 1/2: 5.66 cm2
Calc EF: 7.3 %
Height: 64 in
MV M vel: 4.87 m/s
MV Peak grad: 94.9 mmHg
MV VTI: 2.72 cm2
Radius: 0.4 cm
S' Lateral: 4.63 cm
Single Plane A2C EF: 4.8 %
Single Plane A4C EF: 12.3 %
Weight: 2857.16 [oz_av]

## 2023-07-17 LAB — GLUCOSE, CAPILLARY
Glucose-Capillary: 255 mg/dL — ABNORMAL HIGH (ref 70–99)
Glucose-Capillary: 123 mg/dL — ABNORMAL HIGH (ref 70–99)
Glucose-Capillary: 210 mg/dL — ABNORMAL HIGH (ref 70–99)
Glucose-Capillary: 142 mg/dL — ABNORMAL HIGH (ref 70–99)

## 2023-07-17 LAB — FERRITIN: Ferritin: 13 ng/mL (ref 11–307)

## 2023-07-17 LAB — MAGNESIUM: Magnesium: 2.5 mg/dL — ABNORMAL HIGH (ref 1.7–2.4)

## 2023-07-17 LAB — TROPONIN I (HIGH SENSITIVITY)
Troponin I (High Sensitivity): 29 ng/L — ABNORMAL HIGH (ref <18)
Troponin I (High Sensitivity): 42 ng/L — ABNORMAL HIGH (ref <18)
Troponin I (High Sensitivity): 49 ng/L — ABNORMAL HIGH (ref <18)
Troponin I (High Sensitivity): 57 ng/L — ABNORMAL HIGH (ref <18)

## 2023-07-17 LAB — BRAIN NATRIURETIC PEPTIDE: B Natriuretic Peptide: 957 pg/mL — ABNORMAL HIGH (ref 0.0–100.0)

## 2023-07-17 MED ORDER — POLYETHYLENE GLYCOL 3350 17 G PO PACK
17.0000 g | PACK | Freq: Every day | ORAL | Status: DC
  Administered 2023-07-17 – 2023-07-20 (×4): 17 g via ORAL
  Filled 2023-07-17 (×4): qty 1

## 2023-07-17 MED ORDER — PERFLUTREN LIPID MICROSPHERE
1.0000 mL | INTRAVENOUS | Status: AC | PRN
  Administered 2023-07-17: 2 mL via INTRAVENOUS

## 2023-07-17 MED ORDER — ENSURE ENLIVE PO LIQD
237.0000 mL | Freq: Two times a day (BID) | ORAL | Status: DC
Start: 2023-07-17 — End: 2023-07-20
  Administered 2023-07-17 – 2023-07-20 (×7): 237 mL via ORAL

## 2023-07-17 MED ORDER — ONDANSETRON HCL 4 MG/2ML IJ SOLN: 4.0000 mg | Freq: Four times a day (QID) | INTRAMUSCULAR | Status: DC | PRN

## 2023-07-17 MED ORDER — IRBESARTAN 150 MG PO TABS
300.0000 mg | ORAL_TABLET | Freq: Every day | ORAL | Status: DC
  Administered 2023-07-17: 300 mg via ORAL
  Filled 2023-07-17: qty 2

## 2023-07-17 MED ORDER — ISOSORBIDE MONONITRATE ER 60 MG PO TB24
30.0000 mg | ORAL_TABLET | Freq: Every day | ORAL | Status: DC
  Administered 2023-07-17 – 2023-07-20 (×4): 30 mg via ORAL
  Filled 2023-07-17 (×4): qty 1

## 2023-07-17 MED ORDER — PROCHLORPERAZINE EDISYLATE 10 MG/2ML IJ SOLN
10.0000 mg | Freq: Four times a day (QID) | INTRAMUSCULAR | Status: DC | PRN
  Administered 2023-07-17: 10 mg via INTRAVENOUS
  Filled 2023-07-17: qty 2

## 2023-07-17 MED ORDER — APIXABAN 5 MG PO TABS
5.0000 mg | ORAL_TABLET | Freq: Two times a day (BID) | ORAL | Status: DC
  Administered 2023-07-17 – 2023-07-20 (×7): 5 mg via ORAL
  Filled 2023-07-17 (×7): qty 1

## 2023-07-17 MED ORDER — METOPROLOL TARTRATE 5 MG/5ML IV SOLN
2.5000 mg | Freq: Once | INTRAVENOUS | Status: AC
  Administered 2023-07-17: 2.5 mg via INTRAVENOUS
  Filled 2023-07-17: qty 5

## 2023-07-17 MED ORDER — METOPROLOL TARTRATE 25 MG PO TABS
25.0000 mg | ORAL_TABLET | Freq: Four times a day (QID) | ORAL | Status: DC
  Administered 2023-07-17 – 2023-07-19 (×8): 25 mg via ORAL
  Filled 2023-07-17 (×8): qty 1

## 2023-07-17 MED ORDER — HYDRALAZINE HCL 25 MG PO TABS
50.0000 mg | ORAL_TABLET | Freq: Two times a day (BID) | ORAL | Status: DC
  Administered 2023-07-17 – 2023-07-20 (×7): 50 mg via ORAL
  Filled 2023-07-17 (×7): qty 2

## 2023-07-17 MED ORDER — ACETAMINOPHEN 650 MG RE SUPP: 650.0000 mg | Freq: Four times a day (QID) | RECTAL | Status: DC | PRN

## 2023-07-17 MED ORDER — ENOXAPARIN SODIUM 30 MG/0.3ML IJ SOSY
30.0000 mg | PREFILLED_SYRINGE | INTRAMUSCULAR | Status: DC
  Administered 2023-07-17: 30 mg via SUBCUTANEOUS
  Filled 2023-07-17: qty 0.3

## 2023-07-17 MED ORDER — INSULIN ASPART 100 UNIT/ML IJ SOLN
0.0000 [IU] | Freq: Three times a day (TID) | INTRAMUSCULAR | Status: DC
  Administered 2023-07-17: 5 [IU] via SUBCUTANEOUS
  Administered 2023-07-17: 1 [IU] via SUBCUTANEOUS
  Administered 2023-07-17: 3 [IU] via SUBCUTANEOUS
  Administered 2023-07-18 (×2): 1 [IU] via SUBCUTANEOUS
  Administered 2023-07-18 – 2023-07-19 (×2): 2 [IU] via SUBCUTANEOUS
  Administered 2023-07-19 (×2): 1 [IU] via SUBCUTANEOUS
  Administered 2023-07-20: 2 [IU] via SUBCUTANEOUS
  Administered 2023-07-20: 3 [IU] via SUBCUTANEOUS

## 2023-07-17 MED ORDER — FUROSEMIDE 10 MG/ML IJ SOLN
40.0000 mg | Freq: Two times a day (BID) | INTRAMUSCULAR | Status: AC
  Administered 2023-07-17 – 2023-07-19 (×5): 40 mg via INTRAVENOUS
  Filled 2023-07-17 (×6): qty 4

## 2023-07-17 MED ORDER — SIMVASTATIN 20 MG PO TABS
40.0000 mg | ORAL_TABLET | Freq: Every day | ORAL | Status: DC
  Administered 2023-07-17 – 2023-07-19 (×3): 40 mg via ORAL
  Filled 2023-07-17 (×3): qty 2

## 2023-07-17 MED ORDER — LEVOTHYROXINE SODIUM 112 MCG PO TABS
112.0000 ug | ORAL_TABLET | Freq: Every day | ORAL | Status: DC
  Administered 2023-07-18 – 2023-07-20 (×3): 112 ug via ORAL
  Filled 2023-07-17 (×3): qty 1

## 2023-07-17 MED ORDER — INSULIN GLARGINE-YFGN 100 UNIT/ML ~~LOC~~ SOLN
10.0000 [IU] | Freq: Every day | SUBCUTANEOUS | Status: DC
  Administered 2023-07-17 – 2023-07-19 (×3): 10 [IU] via SUBCUTANEOUS
  Filled 2023-07-17 (×4): qty 0.1

## 2023-07-17 MED ORDER — SENNA 8.6 MG PO TABS
2.0000 | ORAL_TABLET | Freq: Every day | ORAL | Status: DC
  Administered 2023-07-17 – 2023-07-20 (×4): 17.2 mg via ORAL
  Filled 2023-07-17 (×4): qty 2

## 2023-07-17 MED ORDER — INSULIN ASPART 100 UNIT/ML IJ SOLN: 0.0000 [IU] | Freq: Every day | INTRAMUSCULAR | Status: DC

## 2023-07-17 MED ORDER — ACETAMINOPHEN 325 MG PO TABS
650.0000 mg | ORAL_TABLET | Freq: Four times a day (QID) | ORAL | Status: DC | PRN
  Administered 2023-07-20: 650 mg via ORAL
  Filled 2023-07-17: qty 2

## 2023-07-17 MED ORDER — FUROSEMIDE 10 MG/ML IJ SOLN
40.0000 mg | Freq: Once | INTRAMUSCULAR | Status: AC
  Administered 2023-07-17: 40 mg via INTRAVENOUS
  Filled 2023-07-17: qty 4

## 2023-07-17 MED ORDER — ONDANSETRON HCL 4 MG PO TABS
4.0000 mg | ORAL_TABLET | Freq: Four times a day (QID) | ORAL | Status: DC | PRN
Start: 2023-07-17 — End: 2023-07-17

## 2023-07-17 NOTE — ED Triage Notes (Signed)
 Pt arrived from home via POV c/o not being able to breathe ,a sore throat, and post nasal drainage x 1 week. Pt states that she has been using a primatine mist to help symptoms, but ran out.

## 2023-07-17 NOTE — Progress Notes (Signed)
 Transition of Care Department [facility]) has reviewed patient and no other TOC needs have been identified at this time. We will continue to monitor patient advancement through interdisciplinary progression rounds. If new patient transition needs arise, please place a TOC consult.   07/17/23 0803  TOC Brief Assessment  Insurance and Status Reviewed  Patient has primary care physician Yes  Home environment has been reviewed Lives with husband.  Prior level of function: Independent.  Prior/Current Home Services No current home services  Social Drivers of Health Review SDOH reviewed no interventions necessary  Readmission risk has been reviewed Yes  Transition of care needs no transition of care needs at this time

## 2023-07-17 NOTE — Progress Notes (Addendum)
 PROGRESS NOTE  Holly Baldwin:[id number] DOB: Jul 03, 1945 DOA: 07/17/2023 PCP: Minus Amel, MD  Brief History:  78 year old female with a history of hypertension, hyperlipidemia, diabetes mellitus type 2, HFpEF, hypothyroidism, CKD stage III, TIA, anxiety presenting with 2-week history of shortness of breath and orthopnea.  The patient has noted increasing lower extremity edema as well as increasing abdominal girth.  She states that she has been sleeping on a recliner for a few days prior to admission. In the ED, the patient was afebrile and hemodynamically stable.  Oxygen  saturation was 89% on room air.  She was placed on 2 L with saturation up to 92%.  WBC 11.8, hemoglobin 10.0, platelets 272.  Sodium 136, potassium 4.5, bicarbonate 24, serum creatinine 2.4.  LFTs were unremarkable.  Chest x-ray showed increased vascular congestion and interstitial markings.  BNP 957.  Troponin 29>> 42.  The patient was started on IV furosemide . Updated echocardiogram on 07/17/2023 showed EF 20-25%.  Cardiology was consulted to assist with management.   Assessment/Plan: Acute HFrEF - Continue IV furosemide  - Remains clinically fluid overloaded - Appreciate cardiology consult - 07/17/2023 echo EF 20-25%, mild TR, normal RVF, PASP 72 - Daily weights - Accurate I's and O's - Imdur  added to hydralazine   New onset atrial fibrillation - Start apixaban -CHADSVASc2 =  5 - Echocardiogram as above - Start Lopressor  p.o. 25 mg every 6 hours with hold parameters  Acute respiratory failure with hypoxia -stable on 2L -due to pulm edema -initially 89% on RA -wean oxygen  as tolerated  CKD stage IV - Baseline creatinine 1.8-2.2 - Continue olmesartan  for now and monitor renal function closely  Essential hypertension - Imdur  added to hydralazine  - Continue olmesartan  for now and monitor renal function closely  Diabetes mellitus type 2 with hyperglycemia - 05/01/2023 hemoglobin A1c 7.0 - Reduced  dose Semglee  - NovoLog  sliding scale  Obesity - BMI 30.65 - Lifestyle modification  Hypothyroidism - Continue Synthroid   Mixed hyperlipidemia - Continue statin   Total time spent 50 minutes.  Greater than 50% spent face to face counseling and coordinating care.    Family Communication:  no Family at bedside  Consultants:  cardiology  Code Status:  FULL   DVT Prophylaxis: apixaban   Procedures: As Listed in Progress Note Above  Antibiotics: None        Subjective: Pt breathing better.  Denies f/c, cp,n/v/d, abd pain  Objective: Vitals:   07/17/23 0802 07/17/23 1143 07/17/23 1350 07/17/23 1443  BP: (!) 135/95 (!) 146/75 109/78 124/81  Pulse: 90 82 (!) 125 87  Resp:  20 20   Temp: 98.8 F (37.1 C) 97.6 F (36.4 C) 97.6 F (36.4 C)   TempSrc: Oral Oral Oral   SpO2: 95% 97% 95%   Weight:      Height:        Intake/Output Summary (Last 24 hours) at 07/17/2023 1556 Last data filed at 07/17/2023 1429 Gross per 24 hour  Intake 460 ml  Output --  Net 460 ml   Weight change:  Exam:  General:  Pt is alert, follows commands appropriately, not in acute distress HEENT: No icterus, No thrush, No neck mass, [place]/AT Cardiovascular: IRRR, S1/S2, no rubs, no gallops Respiratory: bilateral crackles. No wheeze Abdomen: Soft/+BS, non tender, non distended, no guarding Extremities: 1 + LE edema, No lymphangitis, No petechiae, No rashes, no synovitis   Data Reviewed: I have personally reviewed following labs and imaging studies Basic  Metabolic Panel: Recent Labs  Lab 07/17/23 0239 07/17/23 0428  NA 136  --   K 4.5  --   CL 99  --   CO2 27  --   GLUCOSE 186*  --   BUN 36*  --   CREATININE 2.14*  --   CALCIUM  9.1  --   MG  --  2.5*   Liver Function Tests: Recent Labs  Lab 07/17/23 0239  AST 14*  ALT 13  ALKPHOS 98  BILITOT 0.8  PROT 6.8  ALBUMIN 3.6   No results for input(s): "LIPASE", "AMYLASE" in the last 168 hours. No results for input(s):  "AMMONIA" in the last 168 hours. Coagulation Profile: No results for input(s): "INR", "PROTIME" in the last 168 hours. CBC: Recent Labs  Lab 07/17/23 0239  WBC 11.8*  NEUTROABS 10.0*  HGB 10.0*  HCT 31.6*  MCV 76.3*  PLT 272   Cardiac Enzymes: No results for input(s): "CKTOTAL", "CKMB", "CKMBINDEX", "TROPONINI" in the last 168 hours. BNP: Invalid input(s): "POCBNP" CBG: Recent Labs  Lab 07/17/23 0955 07/17/23 1149  GLUCAP 255* 123*   HbA1C: No results for input(s): "HGBA1C" in the last 72 hours. Urine analysis:    Component Value Date/Time   COLORURINE STRAW (A) 12/25/2020 1420   APPEARANCEUR CLEAR 12/25/2020 1420   LABSPEC 1.006 12/25/2020 1420   PHURINE 5.0 12/25/2020 1420   GLUCOSEU NEGATIVE 12/25/2020 1420   HGBUR SMALL (A) 12/25/2020 1420   BILIRUBINUR NEGATIVE 12/25/2020 1420   KETONESUR NEGATIVE 12/25/2020 1420   PROTEINUR 100 (A) 12/25/2020 1420   UROBILINOGEN 1.0 10/10/2014 1120   NITRITE NEGATIVE 12/25/2020 1420   LEUKOCYTESUR NEGATIVE 12/25/2020 1420   Sepsis Labs: @LABRCNTIP (procalcitonin:4,lacticidven:4) )No results found for this or any previous visit (from the past 240 hours).   Scheduled Meds:  apixaban  5 mg Oral BID   feeding supplement  237 mL Oral BID BM   furosemide   40 mg Intravenous Q12H   hydrALAZINE   50 mg Oral BID   insulin  aspart  0-5 Units Subcutaneous QHS   insulin  aspart  0-9 Units Subcutaneous TID WC   insulin  glargine-yfgn  10 Units Subcutaneous QHS   irbesartan   300 mg Oral Daily   isosorbide  mononitrate  30 mg Oral Daily   [START ON 07/18/2023] levothyroxine   112 mcg Oral QAC breakfast   metoprolol  tartrate  25 mg Oral QID   simvastatin   40 mg Oral q1800   Continuous Infusions:  Procedures/Studies: ECHOCARDIOGRAM COMPLETE Result Date: 07/17/2023    ECHOCARDIOGRAM REPORT   Patient Name:   Holly Baldwin Date of Exam: 07/17/2023 Medical Rec #:  [id number]    Height:       64.0 in Accession #:    [id number]   Weight:        178.6 lb Date of Birth:  [birth year]    BSA:          1.864 m Patient Age:    78 years     BP:           135/95 mmHg Patient Gender: F            HR:           76 bpm. Exam Location:  [facility] Procedure: 2D Echo, Cardiac Doppler, Color Doppler and Intracardiac            Opacification Agent (Both Spectral and Color Flow Doppler were            utilized during procedure). Indications:  I50.40* Unspecified combined systolic (congestive) and diastolic                 (congestive) heart failure  History:        Patient has prior history of Echocardiogram examinations. CHF,                 TIA, Signs/Symptoms:Shortness of Breath and Dyspnea; Risk                 Factors:Hypertension, Dyslipidemia and Diabetes.  Sonographer:    Raynelle Callow RDCS Referring Phys: [id number] OLADAPO ADEFESO  Sonographer Comments: [id number]. IMPRESSIONS  1. Left ventricular ejection fraction, by estimation, is 20 to 25%. The left ventricle has severely decreased function. The left ventricle demonstrates global hypokinesis. There is moderate left ventricular hypertrophy. Left ventricular diastolic parameters are indeterminate.  2. Right ventricular systolic function is normal. The right ventricular size is normal. There is severely elevated pulmonary artery systolic pressure. The estimated right ventricular systolic pressure is 72.2 mmHg.  3. Left atrial size was mildly dilated.  4. The mitral valve is abnormal. Moderate mitral valve regurgitation. No evidence of mitral stenosis.  5. The tricuspid valve is abnormal.  6. The aortic valve is tricuspid. Aortic valve regurgitation is not visualized. No aortic stenosis is present.  7. The inferior vena cava is dilated in size with <50% respiratory variability, suggesting right atrial pressure of 15 mmHg. FINDINGS  Left Ventricle: Question of possible prominent apical trabeculations of standard 2D imaging, however with contrast does not appear to be significant. Left ventricular ejection fraction, by  estimation, is 20 to 25%. The left ventricle has severely decreased function. The left ventricle demonstrates global hypokinesis. Definity contrast agent was given IV to delineate the left ventricular endocardial borders. The left ventricular internal cavity size was normal in size. There is moderate left ventricular hypertrophy. Left ventricular diastolic parameters are indeterminate. Right Ventricle: The right ventricular size is normal. Right vetricular wall thickness was not well visualized. Right ventricular systolic function is normal. There is severely elevated pulmonary artery systolic pressure. The tricuspid regurgitant velocity is 3.78 m/s, and with an assumed right atrial pressure of 15 mmHg, the estimated right ventricular systolic pressure is 72.2 mmHg. Left Atrium: Left atrial size was mildly dilated. Right Atrium: Right atrial size was normal in size. Pericardium: There is no evidence of pericardial effusion. Mitral Valve: The mitral valve is abnormal. Moderate mitral valve regurgitation. No evidence of mitral valve stenosis. MV peak gradient, 8.6 mmHg. The mean mitral valve gradient is 3.0 mmHg. Tricuspid Valve: The tricuspid valve is abnormal. Tricuspid valve regurgitation is mild . No evidence of tricuspid stenosis. Aortic Valve: The aortic valve is tricuspid. Aortic valve regurgitation is not visualized. No aortic stenosis is present. Aortic valve mean gradient measures 4.0 mmHg. Aortic valve peak gradient measures 8.7 mmHg. Aortic valve area, by VTI measures 2.86 cm. Pulmonic Valve: The pulmonic valve was not well visualized. Pulmonic valve regurgitation is not visualized. No evidence of pulmonic stenosis. Aorta: The aortic root and ascending aorta are structurally normal, with no evidence of dilitation. Venous: The inferior vena cava is dilated in size with less than 50% respiratory variability, suggesting right atrial pressure of 15 mmHg. IAS/Shunts: The interatrial septum was not well  visualized.  LEFT VENTRICLE PLAX 2D LVIDd:         4.87 cm      Diastology LVIDs:         4.63 cm      LV e' medial:  5.42 cm/s LV PW:         1.30 cm      LV E/e' medial:  22.5 LV IVS:        1.30 cm      LV e' lateral:   10.40 cm/s LVOT diam:     2.30 cm      LV E/e' lateral: 11.7 LV SV:         67 LV SV Index:   36 LVOT Area:     4.15 cm  LV Volumes (MOD) LV vol d, MOD A2C: 228.0 ml LV vol d, MOD A4C: 227.0 ml LV vol s, MOD A2C: 217.0 ml LV vol s, MOD A4C: 199.0 ml LV SV MOD A2C:     11.0 ml LV SV MOD A4C:     227.0 ml LV SV MOD BP:      16.4 ml RIGHT VENTRICLE             IVC RV S prime:     14.60 cm/s  IVC diam: 2.52 cm TAPSE (M-mode): 1.8 cm LEFT ATRIUM             Index        RIGHT ATRIUM           Index LA diam:        2.90 cm 1.56 cm/m   RA Area:     16.80 cm LA Vol (A2C):   55.5 ml 29.77 ml/m  RA Volume:   53.70 ml  28.80 ml/m LA Vol (A4C):   71.6 ml 38.40 ml/m LA Biplane Vol: 65.7 ml 35.24 ml/m  AORTIC VALVE AV Area (Vmax):    3.30 cm AV Area (Vmean):   2.98 cm AV Area (VTI):     2.86 cm AV Vmax:           147.39 cm/s AV Vmean:          93.612 cm/s AV VTI:            0.235 m AV Peak Grad:      8.7 mmHg AV Mean Grad:      4.0 mmHg LVOT Vmax:         117.00 cm/s LVOT Vmean:        67.100 cm/s LVOT VTI:          0.162 m LVOT/AV VTI ratio: 0.69  AORTA Ao Root diam: 3.30 cm Ao Asc diam:  2.70 cm MITRAL VALVE                  TRICUSPID VALVE MV Area (PHT): 5.66 cm       TR Peak grad:   57.2 mmHg MV Area VTI:   2.72 cm       TR Vmax:        378.00 cm/s MV Peak grad:  8.6 mmHg MV Mean grad:  3.0 mmHg       SHUNTS MV Vmax:       1.47 m/s       Systemic VTI:  0.16 m MV Vmean:      77.5 cm/s      Systemic Diam: 2.30 cm MV Decel Time: 134 msec MR Peak grad:    94.9 mmHg MR Mean grad:    51.0 mmHg MR Vmax:         487.00 cm/s MR Vmean:        328.0 cm/s MR PISA:         1.01 cm MR PISA Eff  ROA: 8 mm MR PISA Radius:  0.40 cm MV E velocity: 122.00 cm/s MV A velocity: 88.10 cm/s MV E/A ratio:  1.38  Armida Lander MD Electronically signed by Armida Lander MD Signature Date/Time: 07/17/2023/12:16:21 PM    Final    DG Chest Portable 1 View Result Date: 07/17/2023 CLINICAL DATA:  Dyspnea fibrosis. EXAM: PORTABLE CHEST 1 VIEW COMPARISON:  Portable chest 11/01/2021 FINDINGS: The heart has moderately enlarged since the prior study. There is increased vascular engorgement. There is mild generalized interstitial consolidation consistent with edema, with a basal gradient and small pleural effusions. There is patchy haziness in the lung bases which could be hazy atelectasis, ground-glass edema or pneumonitis. No other focal infiltrate is seen. The mediastinum is stable with aortic tortuosity and atherosclerosis. Thoracic spondylosis with no new osseous finding. IMPRESSION: 1. Interval development of moderate cardiomegaly with increased vascular engorgement, with mild generalized interstitial consolidation consistent with edema. 2. Small pleural effusions. 3. Patchy haziness in the lung bases which could be hazy atelectasis, ground-glass edema or pneumonitis. 4. Clinical correlation and radiographic follow-up recommended. Electronically Signed   By: Denman Fischer M.D.   On: 07/17/2023 03:21    Demaris Fillers, DO  [facility]  If 7PM-7AM, please contact night-coverage [email or web address] Password [email or web address] 07/17/2023, 3:56 PM   LOS: 0 days

## 2023-07-17 NOTE — H&P (Signed)
 History and Physical    Patient: Holly Baldwin WUJ:[id number] DOB: [birth year] DOA: 07/17/2023 DOS: the patient was seen and examined on 07/17/2023 PCP: Minus Amel, MD  Patient coming from: Home  Chief Complaint:  Chief Complaint  Patient presents with   Shortness of Breath   HPI: Holly Baldwin is a 78 y.o. female with medical history significant of hypertension, hyperlipidemia, type 2 diabetes mellitus, CKD 4, obesity who presents to the emergency department due to 2-week onset of worsening shortness of breath which occurs at rest and worsens with exertion.  She complained of increased abdominal girth and difficulty in being able to lay flat in bed, patient states that she has been sleeping on a recliner since the last 5 days, shortness of breath was worse yesterday and she decided to go to the ED for further evaluation and management.  ED Course:  In the emergency department, BP was 150/76, respiratory rate was 23/min, patient required 3 LPM of oxygen  to maintain O2 sat above 90% (patient does not use oxygen  at baseline), pulse 83 bpm, temperature 58F.  Workup in the ED showed WBC 11.8, hemoglobin 10.0, hematocrit 31.6, MCV 76.3, platelets 272.  BMP was normal except for blood glucose of 186 and BUN/creatinine of 36/2.14 (creatinine appears to be within baseline range).  BNP 957, troponin 29 > 42, magnesium  2.5 Chest x-ray showed interval development of moderate cardiomegaly with increased vascular engorgement, with mild generalized interstitial consolidation consistent with edema. Small pleural effusions. Patchy haziness in the lung bases which could be hazy atelectasis, ground-glass edema or pneumonitis. He was treated with IV Lasix  40 mg x 1.  TRH was asked to admit patient  Review of Systems: Review of systems as noted in the HPI. All other systems reviewed and are negative.   Past Medical History:  Diagnosis Date   Anxiety    Chronic abdominal pain    Diabetes mellitus    GERD  (gastroesophageal reflux disease)    Hiatal hernia    Hypercholesteremia    Hypertension    Past Surgical History:  Procedure Laterality Date   ABDOMINAL HYSTERECTOMY     CATARACT EXTRACTION W/PHACO Right 01/09/2020   Procedure: CATARACT EXTRACTION PHACO AND INTRAOCULAR LENS PLACEMENT (IOC);  Surgeon: Tarri Farm, MD;  Location: [facility];  Service: Ophthalmology;  Laterality: Right;  CDE: 12.01   CATARACT EXTRACTION W/PHACO Left 01/23/2020   Procedure: CATARACT EXTRACTION PHACO AND INTRAOCULAR LENS PLACEMENT LEFT EYE;  Surgeon: Tarri Farm, MD;  Location: [facility];  Service: Ophthalmology;  Laterality: Left;  CDE 8.71   COLONOSCOPY N/A 06/23/2012   Procedure: COLONOSCOPY;  Surgeon: Ruby Corporal, MD;  Location: [facility];  Service: Endoscopy;  Laterality: N/A;  100-moved to 1200 Ann to notify pt   ESOPHAGOGASTRODUODENOSCOPY N/A 05/06/2012   Procedure: ESOPHAGOGASTRODUODENOSCOPY (EGD);  Surgeon: Ruby Corporal, MD;  Location: [facility];  Service: Endoscopy;  Laterality: N/A;   MASS EXCISION Right 05/26/2012   Procedure: EXCISION NEOPLASM RIGHT THIGH;  Surgeon: Beau Bound, MD;  Location: [facility];  Service: General;  Laterality: Right;    Social History:  reports that she has never smoked. She has never used smokeless tobacco. She reports that she does not drink alcohol and does not use drugs.   Allergies  Allergen Reactions   Bee Venom Anaphylaxis   Penicillins Hives    .Has patient had a PCN reaction causing immediate rash, facial/tongue/throat swelling, SOB or lightheadedness with hypotension: Yes Has patient had a PCN reaction  causing severe rash involving mucus membranes or skin necrosis: No Has patient had a PCN reaction that required hospitalization: Yes Has patient had a PCN reaction occurring within the last 10 years: No If all of the above answers are "NO", then may proceed with Cephalosporin use.  .Has patient had a PCN reaction causing immediate rash,  facial/tongue/throat swelling, SOB or lightheadedness with hypotension: Yes Has patient had a PCN reaction causing severe rash involving mucus membranes or skin necrosis: No Has patient had a PCN reaction that required hospitalization: Yes Has patient had a PCN reaction occurring within the last 10 years: No If all of the above answers are "NO", then may proceed with Cephalosporin use. .Has patient had a PCN reaction causing immediate rash, facial/tongue/throat swelling, SOB or lightheadedness with hypotension: Yes Has patient had a PCN reaction causing severe rash involving mucus membranes or skin necrosis: No Has patient had a PCN reaction that required hospitalization: Yes Has patient had a PCN reaction occurring within the last 10 years: No If all of the above answers are "NO", then may proceed with Cephalosporin use.    Family History  Problem Relation Age of Onset   Diabetes Mother    Diabetes Father    Stroke Father    Diabetes Sister    Diabetes Brother    Colon cancer Neg Hx    Colon polyps Neg Hx      Prior to Admission medications   Medication Sig Start Date End Date Taking? Authorizing Provider  albuterol  (PROVENTIL  HFA;VENTOLIN  HFA) 108 (90 Base) MCG/ACT inhaler Inhale 1-2 puffs into the lungs every 6 (six) hours as needed for wheezing or shortness of breath.     [provider]  calcitRIOL (ROCALTROL) 0.25 MCG capsule Take 0.25 mcg by mouth every Monday, Wednesday, and Friday. 11/28/20   [provider]  clopidogrel  (PLAVIX ) 75 MG tablet Take 75 mg by mouth daily. 06/06/22   [provider]  dapagliflozin  propanediol (FARXIGA ) 5 MG TABS tablet Take 1 tablet (5 mg total) by mouth daily. 05/01/23   Wendel Hals, NP  escitalopram (LEXAPRO) 10 MG tablet Take 10 mg by mouth daily. 04/11/21   [provider]  Finerenone (KERENDIA) 10 MG TABS Take by mouth daily.    [provider]  furosemide  (LASIX ) 20 MG tablet Take 20 mg by mouth  2 (two) times daily. 04/05/21   [provider]  hydrALAZINE  (APRESOLINE ) 50 MG tablet Take 50 mg by mouth 2 (two) times daily. 03/22/21   [provider]  insulin  glargine (LANTUS  SOLOSTAR) 100 UNIT/ML Solostar Pen Inject 15 Units into the skin at bedtime. 12/10/22   Wendel Hals, NP  Insulin  Pen Needle (PEN NEEDLES) 31G X 8 MM MISC Use to take insulin  1 time a day 12/10/22   Wendel Hals, NP  isosorbide  mononitrate (IMDUR ) 60 MG 24 hr tablet Take 1 tablet (60 mg total) by mouth daily. 08/26/18   Justina Oman, MD  levocetirizine (XYZAL) 5 MG tablet Take 5 mg by mouth every evening.    [provider]  levothyroxine  (SYNTHROID ) 112 MCG tablet Take 1 tablet (112 mcg total) by mouth daily before breakfast. 05/01/23   Wendel Hals, NP  Magnesium  400 MG TABS Take by mouth.    [provider]  Multiple Vitamins-Minerals (PRESERVISION AREDS 2) CAPS Take by mouth 2 (two) times daily.    [provider]  NIFEdipine  (PROCARDIA  XL/NIFEDICAL-XL) 90 MG 24 hr tablet Take 90 mg by mouth daily.  05/11/21   [provider]  olmesartan  (BENICAR ) 40 MG tablet Take 40 mg by mouth daily. 04/11/21   [provider]  oxyCODONE  (ROXICODONE ) 5 MG immediate release tablet Take 0.5 tablets (2.5 mg total) by mouth every 6 (six) hours as needed. 11/01/21   Mordecai Applebaum, MD  potassium chloride  SA (KLOR-CON  M) 20 MEQ tablet Take 20 mEq by mouth daily. Patient not taking: Reported on 05/01/2023 03/22/21   [provider]  RESTASIS 0.05 % ophthalmic emulsion 1 drop 2 (two) times daily. Patient not taking: Reported on 05/01/2023 12/17/20   [provider]  simvastatin  (ZOCOR ) 40 MG tablet TAKE 1 TABLET EVERY DAY Patient taking differently: Take 40 mg by mouth daily. 10/05/17   Towanda Fret, MD    Physical Exam: BP (!) 169/76   Pulse 90   Temp 99 F (37.2 C) (Oral)   Resp (!) 25   Ht 5\' 4"  (1.626 m)   Wt 81 kg   SpO2 91%    BMI 30.65 kg/m   General: 78 y.o. year-old female well developed well nourished in no acute distress.  Alert and oriented x3. HEENT: NCAT, EOMI Neck: Supple, trachea medial Cardiovascular: Regular rate and rhythm with no rubs or gallops.  No thyromegaly or JVD noted.  +2 lower extremity edema bilaterally. 2/4 pulses in all 4 extremities. Respiratory: Bilateral Rales in lower lobes (R > L ).  No wheezes. Abdomen: Soft, nontender nondistended with normal bowel sounds x4 quadrants. Muskuloskeletal: No cyanosis, clubbing or edema noted bilaterally Neuro: CN II-XII intact, strength 5/5 x 4, sensation, reflexes intact Skin: No ulcerative lesions noted or rashes Psychiatry: Judgement and insight appear normal. Mood is appropriate for condition and setting          Labs on Admission:  Basic Metabolic Panel: Recent Labs  Lab 07/17/23 0239 07/17/23 0428  NA 136  --   K 4.5  --   CL 99  --   CO2 27  --   GLUCOSE 186*  --   BUN 36*  --   CREATININE 2.14*  --   CALCIUM  9.1  --   MG  --  2.5*   Liver Function Tests: Recent Labs  Lab 07/17/23 0239  AST 14*  ALT 13  ALKPHOS 98  BILITOT 0.8  PROT 6.8  ALBUMIN 3.6   No results for input(s): "LIPASE", "AMYLASE" in the last 168 hours. No results for input(s): "AMMONIA" in the last 168 hours. CBC: Recent Labs  Lab 07/17/23 0239  WBC 11.8*  NEUTROABS 10.0*  HGB 10.0*  HCT 31.6*  MCV 76.3*  PLT 272   Cardiac Enzymes: No results for input(s): "CKTOTAL", "CKMB", "CKMBINDEX", "TROPONINI" in the last 168 hours.  BNP (last 3 results) Recent Labs    07/17/23 0239  BNP 957.0*    ProBNP (last 3 results) No results for input(s): "PROBNP" in the last 8760 hours.  CBG: No results for input(s): "GLUCAP" in the last 168 hours.  Radiological Exams on Admission: DG Chest Portable 1 View Result Date: 07/17/2023 CLINICAL DATA:  Dyspnea fibrosis. EXAM: PORTABLE CHEST 1 VIEW COMPARISON:  Portable chest 11/01/2021 FINDINGS: The heart  has moderately enlarged since the prior study. There is increased vascular engorgement. There is mild generalized interstitial consolidation consistent with edema, with a basal gradient and small pleural effusions. There is patchy haziness in the lung bases which could be hazy atelectasis, ground-glass edema or pneumonitis. No other focal infiltrate is seen. The mediastinum is stable with  aortic tortuosity and atherosclerosis. Thoracic spondylosis with no new osseous finding. IMPRESSION: 1. Interval development of moderate cardiomegaly with increased vascular engorgement, with mild generalized interstitial consolidation consistent with edema. 2. Small pleural effusions. 3. Patchy haziness in the lung bases which could be hazy atelectasis, ground-glass edema or pneumonitis. 4. Clinical correlation and radiographic follow-up recommended. Electronically Signed   By: Denman Fischer M.D.   On: 07/17/2023 03:21    EKG: I independently viewed the EKG done and my findings are as followed: Normal sinus rhythm at a rate of 87 bpm with APCs and QTc of 529 ms  Assessment/Plan Present on Admission:  Acute respiratory failure with hypoxia (HCC)  Microcytic anemia  Essential hypertension  Mixed hyperlipidemia  Acquired hypothyroidism  Principal Problem:   Acute CHF (congestive heart failure) (HCC) Active Problems:   Acquired hypothyroidism   Mixed hyperlipidemia   Microcytic anemia   Acute respiratory failure with hypoxia (HCC)   Essential hypertension   Elevated brain natriuretic peptide (BNP) level   Elevated troponin   Prolonged QT interval   CKD (chronic kidney disease) stage 4, GFR 15-29 ml/min (HCC)   Type 2 diabetes mellitus with hyperglycemia (HCC)   Obesity, Class I, BMI 30-34.9  Acute on chronic diastolic CHF Elevated BNP Continue total input/output, daily weights and fluid restriction Continue IV Lasix  40 twice daily Continue heart healthy/carb modified diet  Echocardiogram done in  October 2022 showed LVEF of 60 to 65%.  No RWMA.  Mild LVH.  G1 DD.  Echocardiogram will be done in the morning   Acute respiratory failure with hypoxia in the setting of above Continue supplemental oxygen  to maintain O2 sat > 94% with plan to wean patient off this as tolerated (patient does not use oxygen  at baseline)  Elevated troponin possibly secondary to type II demand ischemia Troponin 29 > 42; patient denies chest pain Continue to trend troponin  Prolonged QT interval QTc 529 ms Avoid QT prolonging drugs Magnesium  was elevated at 2.5; continue to monitor Repeat EKG in the afternoon  Microcytic anemia MCV 76.3, hemoglobin 10.0, hematocrit 31.6 Iron studies will be done  CKD stage IV BUN/creatinine of 36/2.14 (creatinine appears to be within baseline range). Renally adjust medications, avoid nephrotoxic agents/dehydration/hypotension  Essential hypertension Continue hydralazine , Avapro   Mixed hyperlipidemia Continue Zocor   Acquired hypothyroidism Continue Synthroid .  Type 2 diabetes mellitus with hyperglycemia Hemoglobin A1c on 05/01/2023 was 7.0 Continue Semglee  10 units nightly Continue ISS and hypoglycemia protocol  Obesity class I (BMI 30.65) Diet and lifestyle modification  DVT prophylaxis: Lovenox   Code Status: Full  Family Communication: None at bedside  Consults: None  Severity of Illness: The appropriate patient status for this patient is INPATIENT. Inpatient status is judged to be reasonable and necessary in order to provide the required intensity of service to ensure the patient's safety. The patient's presenting symptoms, physical exam findings, and initial radiographic and laboratory data in the context of their chronic comorbidities is felt to place them at high risk for further clinical deterioration. Furthermore, it is not anticipated that the patient will be medically stable for discharge from the hospital within 2 midnights of admission.   * I  certify that at the point of admission it is my clinical judgment that the patient will require inpatient hospital care spanning beyond 2 midnights from the point of admission due to high intensity of service, high risk for further deterioration and high frequency of surveillance required.*  Author: Ivelisse Culverhouse, DO 07/17/2023 7:08 AM  For  on call review [email or web address].

## 2023-07-17 NOTE — ED Provider Notes (Signed)
 [place] EMERGENCY DEPARTMENT AT [facility] Provider Note   CSN: [id number] Arrival date & time: 07/17/23  0051     History  Chief Complaint  Patient presents with   Shortness of Breath    Holly Baldwin is a 78 y.o. female.  78 yo F here with sob, increased chest discomfort with exertion. Le edema. No fevers. No h/o heart failure. Had been using some type of mist but ran out. Has PND as well. No productive cough.   On review of the records she has been on lasix  in the past. Also last echo was normal.    Shortness of Breath      Home Medications Prior to Admission medications   Medication Sig Start Date End Date Taking? Authorizing Provider  albuterol  (PROVENTIL  HFA;VENTOLIN  HFA) 108 (90 Base) MCG/ACT inhaler Inhale 1-2 puffs into the lungs every 6 (six) hours as needed for wheezing or shortness of breath.     [provider]  calcitRIOL (ROCALTROL) 0.25 MCG capsule Take 0.25 mcg by mouth every Monday, Wednesday, and Friday. 11/28/20   [provider]  clopidogrel  (PLAVIX ) 75 MG tablet Take 75 mg by mouth daily. 06/06/22   [provider]  dapagliflozin  propanediol (FARXIGA ) 5 MG TABS tablet Take 1 tablet (5 mg total) by mouth daily. 05/01/23   Wendel Hals, NP  escitalopram (LEXAPRO) 10 MG tablet Take 10 mg by mouth daily. 04/11/21   [provider]  Finerenone (KERENDIA) 10 MG TABS Take by mouth daily.    [provider]  furosemide  (LASIX ) 20 MG tablet Take 20 mg by mouth 2 (two) times daily. 04/05/21   [provider]  hydrALAZINE  (APRESOLINE ) 50 MG tablet Take 50 mg by mouth 2 (two) times daily. 03/22/21   [provider]  insulin  glargine (LANTUS  SOLOSTAR) 100 UNIT/ML Solostar Pen Inject 15 Units into the skin at bedtime. 12/10/22   Wendel Hals, NP  Insulin  Pen Needle (PEN NEEDLES) 31G X 8 MM MISC Use to take insulin  1 time a day 12/10/22   Wendel Hals, NP  isosorbide  mononitrate (IMDUR )  60 MG 24 hr tablet Take 1 tablet (60 mg total) by mouth daily. 08/26/18   Justina Oman, MD  levocetirizine (XYZAL) 5 MG tablet Take 5 mg by mouth every evening.    [provider]  levothyroxine  (SYNTHROID ) 112 MCG tablet Take 1 tablet (112 mcg total) by mouth daily before breakfast. 05/01/23   Wendel Hals, NP  Magnesium  400 MG TABS Take by mouth.    [provider]  Multiple Vitamins-Minerals (PRESERVISION AREDS 2) CAPS Take by mouth 2 (two) times daily.    [provider]  NIFEdipine  (PROCARDIA  XL/NIFEDICAL-XL) 90 MG 24 hr tablet Take 90 mg by mouth daily. 05/11/21   [provider]  olmesartan  (BENICAR ) 40 MG tablet Take 40 mg by mouth daily. 04/11/21   [provider]  oxyCODONE  (ROXICODONE ) 5 MG immediate release tablet Take 0.5 tablets (2.5 mg total) by mouth every 6 (six) hours as needed. 11/01/21   Mordecai Applebaum, MD  potassium chloride  SA (KLOR-CON  M) 20 MEQ tablet Take 20 mEq by mouth daily. Patient not taking: Reported on 05/01/2023 03/22/21   [provider]  RESTASIS 0.05 % ophthalmic emulsion 1 drop 2 (two) times daily. Patient not taking: Reported on 05/01/2023 12/17/20   [provider]  simvastatin  (ZOCOR ) 40 MG tablet TAKE 1 TABLET EVERY DAY Patient taking differently: Take 40 mg by mouth daily.  10/05/17   Towanda Fret, MD      Allergies    Bee venom and Penicillins    Review of Systems   Review of Systems  Respiratory:  Positive for shortness of breath.     Physical Exam Updated Vital Signs BP (!) 154/127   Pulse 81   Temp 99 F (37.2 C) (Oral)   Resp 18   Ht 5\' 4"  (1.626 m)   Wt 81 kg   SpO2 95%   BMI 30.65 kg/m  Physical Exam Vitals and nursing note reviewed.  Constitutional:      Appearance: She is well-developed.  HENT:     Head: Normocephalic and atraumatic.  Cardiovascular:     Rate and Rhythm: Normal rate and regular rhythm.  Pulmonary:     Effort: No respiratory distress.      Breath sounds: No stridor. Examination of the right-lower field reveals rales. Rales present.  Abdominal:     General: There is no distension.  Musculoskeletal:     Cervical back: Normal range of motion.     Right lower leg: Edema present.     Left lower leg: Edema present.  Skin:    General: Skin is warm and dry.  Neurological:     Mental Status: She is alert.     ED Results / Procedures / Treatments   Labs (all labs ordered are listed, but only abnormal results are displayed) Labs Reviewed  CBC WITH DIFFERENTIAL/PLATELET - Abnormal; Notable for the following components:      Result Value   WBC 11.8 (*)    Hemoglobin 10.0 (*)    HCT 31.6 (*)    MCV 76.3 (*)    MCH 24.2 (*)    Neutro Abs 10.0 (*)    Lymphs Abs 0.6 (*)    All other components within normal limits  COMPREHENSIVE METABOLIC PANEL WITH GFR - Abnormal; Notable for the following components:   Glucose, Bld 186 (*)    BUN 36 (*)    Creatinine, Ser 2.14 (*)    AST 14 (*)    GFR, Estimated 23 (*)    All other components within normal limits  BRAIN NATRIURETIC PEPTIDE - Abnormal; Notable for the following components:   B Natriuretic Peptide 957.0 (*)    All other components within normal limits  TROPONIN I (HIGH SENSITIVITY) - Abnormal; Notable for the following components:   Troponin I (High Sensitivity) 29 (*)    All other components within normal limits  TROPONIN I (HIGH SENSITIVITY) - Abnormal; Notable for the following components:   Troponin I (High Sensitivity) 42 (*)    All other components within normal limits  MAGNESIUM     EKG EKG Interpretation Date/Time:  Friday Jul 17 2023 01:41:42 EDT Ventricular Rate:  87 PR Interval:  194 QRS Duration:  153 QT Interval:  447 QTC Calculation: 529 R Axis:   122  Text Interpretation: Sinus rhythm Atrial premature complex Nonspecific intraventricular conduction delay Consider anterior infarct IVCD is new from previous Confirmed by Eve Hinders [phone]) on  07/17/2023 4:43:37 AM  Radiology DG Chest Portable 1 View Result Date: 07/17/2023 CLINICAL DATA:  Dyspnea fibrosis. EXAM: PORTABLE CHEST 1 VIEW COMPARISON:  Portable chest 11/01/2021 FINDINGS: The heart has moderately enlarged since the prior study. There is increased vascular engorgement. There is mild generalized interstitial consolidation consistent with edema, with a basal gradient and small pleural effusions. There is patchy haziness in the lung bases which could be hazy atelectasis, ground-glass edema or  pneumonitis. No other focal infiltrate is seen. The mediastinum is stable with aortic tortuosity and atherosclerosis. Thoracic spondylosis with no new osseous finding. IMPRESSION: 1. Interval development of moderate cardiomegaly with increased vascular engorgement, with mild generalized interstitial consolidation consistent with edema. 2. Small pleural effusions. 3. Patchy haziness in the lung bases which could be hazy atelectasis, ground-glass edema or pneumonitis. 4. Clinical correlation and radiographic follow-up recommended. Electronically Signed   By: Denman Fischer M.D.   On: 07/17/2023 03:21    Procedures .Critical Care  Performed by: Eve Hinders, MD Authorized by: Eve Hinders, MD   Critical care provider statement:    Critical care time (minutes):  30   Critical care was necessary to treat or prevent imminent or life-threatening deterioration of the following conditions:  Respiratory failure and circulatory failure   Critical care was time spent personally by me on the following activities:  Development of treatment plan with patient or surrogate, discussions with consultants, evaluation of patient's response to treatment, examination of patient, ordering and review of laboratory studies, ordering and review of radiographic studies, ordering and performing treatments and interventions, pulse oximetry, re-evaluation of patient's condition and review of old charts     Medications  Ordered in ED Medications  furosemide  (LASIX ) injection 40 mg (40 mg Intravenous Given 07/17/23 0430)    ED Course/ Medical Decision Making/ A&P                                 Medical Decision Making Amount and/or Complexity of Data Reviewed Labs: ordered. Radiology: ordered.  Risk Prescription drug management. Decision regarding hospitalization.  Hypoxic resp failure. Initial concern for possible pneumonia with her URI symptoms but also considered heart failure with BP, crackles and le edema. Bnp elevated, cxr c/w more with edema. No h/o HF, had an episode of NSVT, eelvated troponin, ecg with new IVCD. Suspect cardiac cause, lasix  initiated, will likely admit for further workup unless second troponin is significantly more elevated.  On further review of her records she is supposed to be on lasix , not sure if she is compliant as she doesn't remember taking it.   Final Clinical Impression(s) / ED Diagnoses Final diagnoses:  Congestive heart failure, unspecified HF chronicity, unspecified heart failure type [facility])    Rx / DC Orders ED Discharge Orders     None         Shakedra Beam, Reymundo Caulk, MD 07/19/23 [phone]

## 2023-07-17 NOTE — Consult Note (Signed)
 Cardiology Consultation   Patient ID: ROSALYN LAUR MRN: [id number]; DOB: 04-Aug-1945  Admit date: 07/17/2023 Date of Consult: 07/17/2023  PCP:  Minus Amel, MD   [facility] Providers Cardiologist:  Armida Lander, MD   {    Patient Profile:   IOLENE POMAR is a 78 y.o. female with a hx of HFpEF, CKD IV, HTN, HLD, DM2 who is being seen 07/17/2023 for the evaluation of SOB at the request of Dr Tat.  History of Present Illness:   Ms. Schoenbachler 78 yo female prior history of chronic HFpEF, CKD IV, neprhrotic range protenuria, HTN, HLD, DM2, admitted with SOB, abdominal distension, orthopnea. Sleeping in recliner last few days. Denies any LE edema. No specific chest pains or palpitations.    WBC 11.8 Hgb 10 Plt 272 K 4.5 BUN 36 Cr 2.14 (baseline 1.9 to 2.4) GFR 23 BNP 957 Mg 2.5  Trop 29-->42-->49 EKG SR, new LBBB since 12/2020 EKG CXR +pulm edema, small pleural effusions  Past Medical History:  Diagnosis Date   Anxiety    Chronic abdominal pain    Diabetes mellitus    GERD (gastroesophageal reflux disease)    Hiatal hernia    Hypercholesteremia    Hypertension     Past Surgical History:  Procedure Laterality Date   ABDOMINAL HYSTERECTOMY     CATARACT EXTRACTION W/PHACO Right 01/09/2020   Procedure: CATARACT EXTRACTION PHACO AND INTRAOCULAR LENS PLACEMENT (IOC);  Surgeon: Tarri Farm, MD;  Location: [facility];  Service: Ophthalmology;  Laterality: Right;  CDE: 12.01   CATARACT EXTRACTION W/PHACO Left 01/23/2020   Procedure: CATARACT EXTRACTION PHACO AND INTRAOCULAR LENS PLACEMENT LEFT EYE;  Surgeon: Tarri Farm, MD;  Location: [facility];  Service: Ophthalmology;  Laterality: Left;  CDE 8.71   COLONOSCOPY N/A 06/23/2012   Procedure: COLONOSCOPY;  Surgeon: Ruby Corporal, MD;  Location: [facility];  Service: Endoscopy;  Laterality: N/A;  100-moved to 1200 Ann to notify pt   ESOPHAGOGASTRODUODENOSCOPY N/A 05/06/2012   Procedure: ESOPHAGOGASTRODUODENOSCOPY (EGD);   Surgeon: Ruby Corporal, MD;  Location: [facility];  Service: Endoscopy;  Laterality: N/A;   MASS EXCISION Right 05/26/2012   Procedure: EXCISION NEOPLASM RIGHT THIGH;  Surgeon: Beau Bound, MD;  Location: [facility];  Service: General;  Laterality: Right;      Inpatient Medications: Scheduled Meds:  enoxaparin  (LOVENOX ) injection  30 mg Subcutaneous Q24H   feeding supplement  237 mL Oral BID BM   furosemide   40 mg Intravenous Q12H   hydrALAZINE   50 mg Oral BID   insulin  aspart  0-5 Units Subcutaneous QHS   insulin  aspart  0-9 Units Subcutaneous TID WC   insulin  glargine-yfgn  10 Units Subcutaneous QHS   irbesartan   300 mg Oral Daily   [START ON 07/18/2023] levothyroxine   112 mcg Oral QAC breakfast   simvastatin   40 mg Oral q1800   Continuous Infusions:  PRN Meds: acetaminophen  **OR** acetaminophen , prochlorperazine  Allergies:    Allergies  Allergen Reactions   Bee Venom Anaphylaxis   Penicillins Hives    Social History:   Social History   Socioeconomic History   Marital status: Married    Spouse name: Not on file   Number of children: Not on file   Years of education: Not on file   Highest education level: Not on file  Occupational History   Not on file  Tobacco Use   Smoking status: Never   Smokeless tobacco: Never  Vaping Use   Vaping status:  Never Used  Substance and Sexual Activity   Alcohol use: No   Drug use: No   Sexual activity: Not Currently    Birth control/protection: Post-menopausal  Other Topics Concern   Not on file  Social History Narrative   Not on file   Social Drivers of Health   Financial Resource Strain: Low Risk  (01/20/2023)   Overall Financial Resource Strain (CARDIA)    Difficulty of Paying Living Expenses: Not very hard  Food Insecurity: No Food Insecurity (07/17/2023)   Hunger Vital Sign    Worried About Running Out of Food in the Last Year: Never true    Ran Out of Food in the Last Year: Never true  Transportation Needs:  No Transportation Needs (07/17/2023)   PRAPARE - Administrator, Civil Service (Medical): No    Lack of Transportation (Non-Medical): No  Physical Activity: Inactive (12/17/2022)   Exercise Vital Sign    Days of Exercise per Week: 0 days    Minutes of Exercise per Session: 0 min  Stress: No Stress Concern Present (05/07/2020)   [employer] of Occupational Health - Occupational Stress Questionnaire    Feeling of Stress : Not at all  Social Connections: Moderately Integrated (07/17/2023)   Social Connection and Isolation Panel [NHANES]    Frequency of Communication with Friends and Family: More than three times a week    Frequency of Social Gatherings with Friends and Family: Never    Attends Religious Services: More than 4 times per year    Active Member of [employer] or Organizations: No    Attends Banker Meetings: Never    Marital Status: Married  Catering manager Violence: Not At Risk (07/17/2023)   Humiliation, Afraid, Rape, and Kick questionnaire    Fear of Current or Ex-Partner: No    Emotionally Abused: No    Physically Abused: No    Sexually Abused: No    Family History:    Family History  Problem Relation Age of Onset   Diabetes Mother    Diabetes Father    Stroke Father    Diabetes Sister    Diabetes Brother    Colon cancer Neg Hx    Colon polyps Neg Hx      ROS:  Please see the history of present illness.   All other ROS reviewed and negative.     Physical Exam/Data:   Vitals:   07/17/23 0715 07/17/23 0724 07/17/23 0802 07/17/23 1143  BP: (!) 172/96  (!) 135/95 (!) 146/75  Pulse: (!) 103 (!) 104 90 82  Resp: 15 19  20   Temp:   98.8 F (37.1 C) 97.6 F (36.4 C)  TempSrc:   Oral Oral  SpO2: 95% 93% 95% 97%  Weight:      Height:       No intake or output data in the 24 hours ending 07/17/23 1252    07/17/2023    1:30 AM 05/01/2023   10:36 AM 12/10/2022   10:47 AM  Last 3 Weights  Weight (lbs) 178 lb 9.2 oz 178 lb 6.4 oz 188 lb  3.2 oz  Weight (kg) 81 kg 80.922 kg 85.367 kg     Body mass index is 30.65 kg/m.  General:  Well nourished, well developed, in no acute distress HEENT: normal Neck: no JVD Vascular: No carotid bruits; Distal pulses 2+ bilaterally Cardiac:  irregular, tachy Lungs:  decreased breath sounds bilateral bases Abd: soft, nontender, no hepatomegaly  Ext: no edema Musculoskeletal:  No deformities, BUE and BLE strength normal and equal Skin: warm and dry  Neuro:  CNs 2-12 intact, no focal abnormalities noted Psych:  Normal affect     Laboratory Data:  High Sensitivity Troponin:   Recent Labs  Lab 07/17/23 0239 07/17/23 0428 07/17/23 0657 07/17/23 0929  TROPONINIHS 29* 42* 49* 57*     Chemistry Recent Labs  Lab 07/17/23 0239 07/17/23 0428  NA 136  --   K 4.5  --   CL 99  --   CO2 27  --   GLUCOSE 186*  --   BUN 36*  --   CREATININE 2.14*  --   CALCIUM  9.1  --   MG  --  2.5*  GFRNONAA 23*  --   ANIONGAP 10  --     Recent Labs  Lab 07/17/23 0239  PROT 6.8  ALBUMIN 3.6  AST 14*  ALT 13  ALKPHOS 98  BILITOT 0.8   Lipids No results for input(s): "CHOL", "TRIG", "HDL", "LABVLDL", "LDLCALC", "CHOLHDL" in the last 168 hours.  Hematology Recent Labs  Lab 07/17/23 0239  WBC 11.8*  RBC 4.14  HGB 10.0*  HCT 31.6*  MCV 76.3*  MCH 24.2*  MCHC 31.6  RDW 15.5  PLT 272   Thyroid  No results for input(s): "TSH", "FREET4" in the last 168 hours.  BNP Recent Labs  Lab 07/17/23 0239  BNP 957.0*    DDimer No results for input(s): "DDIMER" in the last 168 hours.   Radiology/Studies:  ECHOCARDIOGRAM COMPLETE Result Date: 07/17/2023    ECHOCARDIOGRAM REPORT   Patient Name:   ELNORIA SCHLEISMAN Mackley Date of Exam: 07/17/2023 Medical Rec #:  [id number]    Height:       64.0 in Accession #:    [id number]   Weight:       178.6 lb Date of Birth:  1945-08-06    BSA:          1.864 m Patient Age:    78 years     BP:           135/95 mmHg Patient Gender: F            HR:           76 bpm.  Exam Location:  [facility] Procedure: 2D Echo, Cardiac Doppler, Color Doppler and Intracardiac            Opacification Agent (Both Spectral and Color Flow Doppler were            utilized during procedure). Indications:    I50.40* Unspecified combined systolic (congestive) and diastolic                 (congestive) heart failure  History:        Patient has prior history of Echocardiogram examinations. CHF,                 TIA, Signs/Symptoms:Shortness of Breath and Dyspnea; Risk                 Factors:Hypertension, Dyslipidemia and Diabetes.  Sonographer:    Raynelle Callow RDCS Referring Phys: [id number] OLADAPO ADEFESO  Sonographer Comments: [id number]. IMPRESSIONS  1. Left ventricular ejection fraction, by estimation, is 20 to 25%. The left ventricle has severely decreased function. The left ventricle demonstrates global hypokinesis. There is moderate left ventricular hypertrophy. Left ventricular diastolic parameters are indeterminate.  2. Right ventricular systolic function is normal. The right ventricular size is normal. There is severely elevated pulmonary  artery systolic pressure. The estimated right ventricular systolic pressure is 72.2 mmHg.  3. Left atrial size was mildly dilated.  4. The mitral valve is abnormal. Moderate mitral valve regurgitation. No evidence of mitral stenosis.  5. The tricuspid valve is abnormal.  6. The aortic valve is tricuspid. Aortic valve regurgitation is not visualized. No aortic stenosis is present.  7. The inferior vena cava is dilated in size with <50% respiratory variability, suggesting right atrial pressure of 15 mmHg. FINDINGS  Left Ventricle: Question of possible prominent apical trabeculations of standard 2D imaging, however with contrast does not appear to be significant. Left ventricular ejection fraction, by estimation, is 20 to 25%. The left ventricle has severely decreased function. The left ventricle demonstrates global hypokinesis. Definity contrast agent was given IV to  delineate the left ventricular endocardial borders. The left ventricular internal cavity size was normal in size. There is moderate left ventricular hypertrophy. Left ventricular diastolic parameters are indeterminate. Right Ventricle: The right ventricular size is normal. Right vetricular wall thickness was not well visualized. Right ventricular systolic function is normal. There is severely elevated pulmonary artery systolic pressure. The tricuspid regurgitant velocity is 3.78 m/s, and with an assumed right atrial pressure of 15 mmHg, the estimated right ventricular systolic pressure is 72.2 mmHg. Left Atrium: Left atrial size was mildly dilated. Right Atrium: Right atrial size was normal in size. Pericardium: There is no evidence of pericardial effusion. Mitral Valve: The mitral valve is abnormal. Moderate mitral valve regurgitation. No evidence of mitral valve stenosis. MV peak gradient, 8.6 mmHg. The mean mitral valve gradient is 3.0 mmHg. Tricuspid Valve: The tricuspid valve is abnormal. Tricuspid valve regurgitation is mild . No evidence of tricuspid stenosis. Aortic Valve: The aortic valve is tricuspid. Aortic valve regurgitation is not visualized. No aortic stenosis is present. Aortic valve mean gradient measures 4.0 mmHg. Aortic valve peak gradient measures 8.7 mmHg. Aortic valve area, by VTI measures 2.86 cm. Pulmonic Valve: The pulmonic valve was not well visualized. Pulmonic valve regurgitation is not visualized. No evidence of pulmonic stenosis. Aorta: The aortic root and ascending aorta are structurally normal, with no evidence of dilitation. Venous: The inferior vena cava is dilated in size with less than 50% respiratory variability, suggesting right atrial pressure of 15 mmHg. IAS/Shunts: The interatrial septum was not well visualized.  LEFT VENTRICLE PLAX 2D LVIDd:         4.87 cm      Diastology LVIDs:         4.63 cm      LV e' medial:    5.42 cm/s LV PW:         1.30 cm      LV E/e' medial:   22.5 LV IVS:        1.30 cm      LV e' lateral:   10.40 cm/s LVOT diam:     2.30 cm      LV E/e' lateral: 11.7 LV SV:         67 LV SV Index:   36 LVOT Area:     4.15 cm  LV Volumes (MOD) LV vol d, MOD A2C: 228.0 ml LV vol d, MOD A4C: 227.0 ml LV vol s, MOD A2C: 217.0 ml LV vol s, MOD A4C: 199.0 ml LV SV MOD A2C:     11.0 ml LV SV MOD A4C:     227.0 ml LV SV MOD BP:      16.4 ml RIGHT VENTRICLE  IVC RV S prime:     14.60 cm/s  IVC diam: 2.52 cm TAPSE (M-mode): 1.8 cm LEFT ATRIUM             Index        RIGHT ATRIUM           Index LA diam:        2.90 cm 1.56 cm/m   RA Area:     16.80 cm LA Vol (A2C):   55.5 ml 29.77 ml/m  RA Volume:   53.70 ml  28.80 ml/m LA Vol (A4C):   71.6 ml 38.40 ml/m LA Biplane Vol: 65.7 ml 35.24 ml/m  AORTIC VALVE AV Area (Vmax):    3.30 cm AV Area (Vmean):   2.98 cm AV Area (VTI):     2.86 cm AV Vmax:           147.39 cm/s AV Vmean:          93.612 cm/s AV VTI:            0.235 m AV Peak Grad:      8.7 mmHg AV Mean Grad:      4.0 mmHg LVOT Vmax:         117.00 cm/s LVOT Vmean:        67.100 cm/s LVOT VTI:          0.162 m LVOT/AV VTI ratio: 0.69  AORTA Ao Root diam: 3.30 cm Ao Asc diam:  2.70 cm MITRAL VALVE                  TRICUSPID VALVE MV Area (PHT): 5.66 cm       TR Peak grad:   57.2 mmHg MV Area VTI:   2.72 cm       TR Vmax:        378.00 cm/s MV Peak grad:  8.6 mmHg MV Mean grad:  3.0 mmHg       SHUNTS MV Vmax:       1.47 m/s       Systemic VTI:  0.16 m MV Vmean:      77.5 cm/s      Systemic Diam: 2.30 cm MV Decel Time: 134 msec MR Peak grad:    94.9 mmHg MR Mean grad:    51.0 mmHg MR Vmax:         487.00 cm/s MR Vmean:        328.0 cm/s MR PISA:         1.01 cm MR PISA Eff ROA: 8 mm MR PISA Radius:  0.40 cm MV E velocity: 122.00 cm/s MV A velocity: 88.10 cm/s MV E/A ratio:  1.38 Armida Lander MD Electronically signed by Armida Lander MD Signature Date/Time: 07/17/2023/12:16:21 PM    Final    DG Chest Portable 1 View Result Date: 07/17/2023 CLINICAL  DATA:  Dyspnea fibrosis. EXAM: PORTABLE CHEST 1 VIEW COMPARISON:  Portable chest 11/01/2021 FINDINGS: The heart has moderately enlarged since the prior study. There is increased vascular engorgement. There is mild generalized interstitial consolidation consistent with edema, with a basal gradient and small pleural effusions. There is patchy haziness in the lung bases which could be hazy atelectasis, ground-glass edema or pneumonitis. No other focal infiltrate is seen. The mediastinum is stable with aortic tortuosity and atherosclerosis. Thoracic spondylosis with no new osseous finding. IMPRESSION: 1. Interval development of moderate cardiomegaly with increased vascular engorgement, with mild generalized interstitial consolidation consistent with edema. 2. Small pleural effusions. 3. Patchy haziness in  the lung bases which could be hazy atelectasis, ground-glass edema or pneumonitis. 4. Clinical correlation and radiographic follow-up recommended. Electronically Signed   By: Denman Fischer M.D.   On: 07/17/2023 03:21     Assessment and Plan:   1.Acute HFrEF - prior history of HFpEF, new presenting of HFrEF this admission - 07/2023 echo LVEF 20-25%, indet diastolic function, normal RV, severe pulm HTN PASP 72, mod MR - BNP 957, CXR +pulmonary edema  -she is on IV 40mg  bid, no I/Os data thus yet. Have placed order - remains fluid overloaded, continue IV diuresis.   - CKD IV with GFR 23 will limit medical therapy - home regimen includes farxiga  5mg , olmesartan  40mg , finerenone (an MRA) per neprhology, looks to be due to her neprhotic range proteinruria. She follows closely with Dr Carrolyn Clan and from prior notes appears to be at her baselnie. Defer continued use to nephrology given her CKD. If nephrology ok remaining on then this regimen will have benefit for her HFrEF as well.  - add imdur  to 30mg  daily to her hydralazine  - with afib go ahead and start lopressor  25mg  every 6 hours with hold parameters,  transition to toprol  later in admission.  - poor cath candidate given renal dysfunction, would treat medically and repeat echo 3-6 months    2. Long QTc - the QTc adjusted for the bundle Rolonda Pontarelli block on manual calculation is around which is ok for her.  - keep K at 4, Mg 2. Monitor any prolonging QT medications  3.Afib - new diagnosis this admission - just noted on telemetry today around 1215pm, elevated HRs to 130s - she is asymptomatic. Unclear duration, possibly could have contributed to her cardiomyopathy - f/u 12 lead EKG to confirm what tele strongly suggests - dose IV lopressor  2.5mg  x 1 now - start oral lopressor  25mg  every 6 hours with hold parameters - if refractory afib with RVR would transfer to ICU to start amiodarone drip. Avoid diltiazem due to low EF, avoid digoxin due to renal dysfunction. - CHADS2Vasc score is at least 5 (CHF, age x 2, DM2, gender), start eliquis 5mg  bid       Signed, Armida Lander, MD  07/17/2023 12:52 PM

## 2023-07-17 NOTE — ED Notes (Signed)
 ED TO INPATIENT HANDOFF REPORT  ED Nurse Name and Phone #: Micah Ade RN, [phone]  S Name/Age/Gender Holly Baldwin 78 y.o. female Room/Bed: APA15/APA15  Code Status   Code Status: Full Code  Home/SNF/Other Home Patient oriented to: self, place, time, and situation Is this baseline? Yes   Triage Complete: Triage complete  Chief Complaint Acute CHF (congestive heart failure) (HCC) [I50.9]  Triage Note Pt arrived from home via POV c/o not being able to breathe ,a sore throat, and post nasal drainage x 1 week. Pt states that she has been using a primatine mist to help symptoms, but ran out.    Allergies Allergies  Allergen Reactions   Bee Venom Anaphylaxis   Penicillins Hives    .Has patient had a PCN reaction causing immediate rash, facial/tongue/throat swelling, SOB or lightheadedness with hypotension: Yes Has patient had a PCN reaction causing severe rash involving mucus membranes or skin necrosis: No Has patient had a PCN reaction that required hospitalization: Yes Has patient had a PCN reaction occurring within the last 10 years: No If all of the above answers are "NO", then may proceed with Cephalosporin use.  .Has patient had a PCN reaction causing immediate rash, facial/tongue/throat swelling, SOB or lightheadedness with hypotension: Yes Has patient had a PCN reaction causing severe rash involving mucus membranes or skin necrosis: No Has patient had a PCN reaction that required hospitalization: Yes Has patient had a PCN reaction occurring within the last 10 years: No If all of the above answers are "NO", then may proceed with Cephalosporin use. .Has patient had a PCN reaction causing immediate rash, facial/tongue/throat swelling, SOB or lightheadedness with hypotension: Yes Has patient had a PCN reaction causing severe rash involving mucus membranes or skin necrosis: No Has patient had a PCN reaction that required hospitalization: Yes Has patient had a PCN reaction  occurring within the last 10 years: No If all of the above answers are "NO", then may proceed with Cephalosporin use.    Level of Care/Admitting Diagnosis ED Disposition     ED Disposition  Admit   Condition  --   Comment  Hospital Area: [facility] [[id number]]  Level of Care: Telemetry [5]  Covid Evaluation: Asymptomatic - no recent exposure (last 10 days) testing not required  Diagnosis: Acute CHF (congestive heart failure) [facility]) [[id number]]  Admitting Physician: ADEFESO, OLADAPO [[id number]]  Attending Physician: ADEFESO, OLADAPO [[id number]]  Certification:: I certify this patient will need inpatient services for at least 2 midnights  Expected Medical Readiness: 07/20/2023          B Medical/Surgery History Past Medical History:  Diagnosis Date   Anxiety    Chronic abdominal pain    Diabetes mellitus    GERD (gastroesophageal reflux disease)    Hiatal hernia    Hypercholesteremia    Hypertension    Past Surgical History:  Procedure Laterality Date   ABDOMINAL HYSTERECTOMY     CATARACT EXTRACTION W/PHACO Right 01/09/2020   Procedure: CATARACT EXTRACTION PHACO AND INTRAOCULAR LENS PLACEMENT (IOC);  Surgeon: Tarri Farm, MD;  Location: [facility];  Service: Ophthalmology;  Laterality: Right;  CDE: 12.01   CATARACT EXTRACTION W/PHACO Left 01/23/2020   Procedure: CATARACT EXTRACTION PHACO AND INTRAOCULAR LENS PLACEMENT LEFT EYE;  Surgeon: Tarri Farm, MD;  Location: [facility];  Service: Ophthalmology;  Laterality: Left;  CDE 8.71   COLONOSCOPY N/A 06/23/2012   Procedure: COLONOSCOPY;  Surgeon: Ruby Corporal, MD;  Location: [facility];  Service: Endoscopy;  Laterality: N/A;  100-moved to 1200 Ann to notify pt   ESOPHAGOGASTRODUODENOSCOPY N/A 05/06/2012   Procedure: ESOPHAGOGASTRODUODENOSCOPY (EGD);  Surgeon: Ruby Corporal, MD;  Location: [facility];  Service: Endoscopy;  Laterality: N/A;   MASS EXCISION Right 05/26/2012   Procedure: EXCISION NEOPLASM RIGHT THIGH;   Surgeon: Beau Bound, MD;  Location: [facility];  Service: General;  Laterality: Right;     A IV Location/Drains/Wounds Patient Lines/Drains/Airways Status     Active Line/Drains/Airways     Name Placement date Placement time Site Days   Peripheral IV 07/17/23 20 G 1" Anterior;Left;Proximal Forearm 07/17/23  0430  Forearm  less than 1   External Urinary Catheter 07/17/23  0500  --  less than 1            Intake/Output Last 24 hours No intake or output data in the 24 hours ending 07/17/23 0704  Labs/Imaging Results for orders placed or performed during the hospital encounter of 07/17/23 (from the past 48 hours)  CBC with Differential     Status: Abnormal   Collection Time: 07/17/23  2:39 AM  Result Value Ref Range   WBC 11.8 (H) 4.0 - 10.5 K/uL   RBC 4.14 3.87 - 5.11 MIL/uL   Hemoglobin 10.0 (L) 12.0 - 15.0 g/dL   HCT 11.9 (L) 14.7 - 82.9 %   MCV 76.3 (L) 80.0 - 100.0 fL   MCH 24.2 (L) 26.0 - 34.0 pg   MCHC 31.6 30.0 - 36.0 g/dL   RDW 56.2 13.0 - 86.5 %   Platelets 272 150 - 400 K/uL   nRBC 0.0 0.0 - 0.2 %   Neutrophils Relative % 86 %   Neutro Abs 10.0 (H) 1.7 - 7.7 K/uL   Lymphocytes Relative 5 %   Lymphs Abs 0.6 (L) 0.7 - 4.0 K/uL   Monocytes Relative 9 %   Monocytes Absolute 1.0 0.1 - 1.0 K/uL   Eosinophils Relative 0 %   Eosinophils Absolute 0.0 0.0 - 0.5 K/uL   Basophils Relative 0 %   Basophils Absolute 0.0 0.0 - 0.1 K/uL   Immature Granulocytes 0 %   Abs Immature Granulocytes 0.04 0.00 - 0.07 K/uL    Comment: Performed at [facility], [address] [place], [place] [address]  Comprehensive metabolic panel     Status: Abnormal   Collection Time: 07/17/23  2:39 AM  Result Value Ref Range   Sodium 136 135 - 145 mmol/L   Potassium 4.5 3.5 - 5.1 mmol/L   Chloride 99 98 - 111 mmol/L   CO2 27 22 - 32 mmol/L   Glucose, Bld 186 (H) 70 - 99 mg/dL    Comment: Glucose reference range applies only to samples taken after fasting for at least 8 hours.   BUN 36 (H)  8 - 23 mg/dL   Creatinine, Ser 6.29 (H) 0.44 - 1.00 mg/dL   Calcium  9.1 8.9 - 10.3 mg/dL   Total Protein 6.8 6.5 - 8.1 g/dL   Albumin 3.6 3.5 - 5.0 g/dL   AST 14 (L) 15 - 41 U/L   ALT 13 0 - 44 U/L   Alkaline Phosphatase 98 38 - 126 U/L   Total Bilirubin 0.8 0.0 - 1.2 mg/dL   GFR, Estimated 23 (L) >60 mL/min    Comment: (NOTE) Calculated using the CKD-EPI Creatinine Equation (2021)    Anion gap 10 5 - 15    Comment: Performed at [facility], [address] [place], [place] [address]  Troponin I (High Sensitivity)  Status: Abnormal   Collection Time: 07/17/23  2:39 AM  Result Value Ref Range   Troponin I (High Sensitivity) 29 (H) <18 ng/L    Comment: (NOTE) Elevated high sensitivity troponin I (hsTnI) values and significant  changes across serial measurements may suggest ACS but many other  chronic and acute conditions are known to elevate hsTnI results.  Refer to the "Links" section for chest pain algorithms and additional  guidance. Performed at [facility], [address], [place], [place] [address]   Brain natriuretic peptide     Status: Abnormal   Collection Time: 07/17/23  2:39 AM  Result Value Ref Range   B Natriuretic Peptide 957.0 (H) 0.0 - 100.0 pg/mL    Comment: Performed at [facility], [address], [place], [place] [address]  Troponin I (High Sensitivity)     Status: Abnormal   Collection Time: 07/17/23  4:28 AM  Result Value Ref Range   Troponin I (High Sensitivity) 42 (H) <18 ng/L    Comment: (NOTE) Elevated high sensitivity troponin I (hsTnI) values and significant  changes across serial measurements may suggest ACS but many other  chronic and acute conditions are known to elevate hsTnI results.  Refer to the "Links" section for chest pain algorithms and additional  guidance. Performed at [facility], [address], [place], [place] [address]   Magnesium      Status: Abnormal   Collection Time: 07/17/23  4:28 AM  Result Value Ref Range   Magnesium   2.5 (H) 1.7 - 2.4 mg/dL    Comment: Performed at [facility], [address], [place], [place] [address]   DG Chest Portable 1 View Result Date: 07/17/2023 CLINICAL DATA:  Dyspnea fibrosis. EXAM: PORTABLE CHEST 1 VIEW COMPARISON:  Portable chest 11/01/2021 FINDINGS: The heart has moderately enlarged since the prior study. There is increased vascular engorgement. There is mild generalized interstitial consolidation consistent with edema, with a basal gradient and small pleural effusions. There is patchy haziness in the lung bases which could be hazy atelectasis, ground-glass edema or pneumonitis. No other focal infiltrate is seen. The mediastinum is stable with aortic tortuosity and atherosclerosis. Thoracic spondylosis with no new osseous finding. IMPRESSION: 1. Interval development of moderate cardiomegaly with increased vascular engorgement, with mild generalized interstitial consolidation consistent with edema. 2. Small pleural effusions. 3. Patchy haziness in the lung bases which could be hazy atelectasis, ground-glass edema or pneumonitis. 4. Clinical correlation and radiographic follow-up recommended. Electronically Signed   By: Denman Fischer M.D.   On: 07/17/2023 03:21    Pending Labs Unresulted Labs (From admission, onward)     Start     Ordered   07/24/23 0500  Creatinine, serum  (enoxaparin  (LOVENOX )    CrCl < 30 ml/min)  Once,   R       Comments: while on enoxaparin  therapy.    07/17/23 0642   07/18/23 0500  Comprehensive metabolic panel  Tomorrow morning,   R        07/17/23 0642   07/18/23 0500  CBC  Tomorrow morning,   R        07/17/23 0642   07/18/23 0500  Magnesium   Tomorrow morning,   R        07/17/23 0642   07/18/23 0500  Phosphorus  Tomorrow morning,   R        07/17/23 0642   07/17/23 0648  Iron and TIBC  Add-on,   AD        07/17/23 2956  07/17/23 0648  Ferritin  Add-on,   AD        07/17/23 0647            Vitals/Pain Today's Vitals   07/17/23 0428  07/17/23 0517 07/17/23 0518 07/17/23 0600  BP: (!) 154/127 (!) 152/56  (!) 169/76  Pulse: 81 93 88 90  Resp: 18  (!) 23 (!) 25  Temp:      TempSrc:      SpO2: 95% 91% 92% 91%  Weight:      Height:      PainSc:        Isolation Precautions No active isolations  Medications Medications  furosemide  (LASIX ) injection 40 mg (has no administration in time range)  enoxaparin  (LOVENOX ) injection 30 mg (has no administration in time range)  acetaminophen  (TYLENOL ) tablet 650 mg (has no administration in time range)    Or  acetaminophen  (TYLENOL ) suppository 650 mg (has no administration in time range)  prochlorperazine (COMPAZINE) injection 10 mg (10 mg Intravenous Given 07/17/23 0648)  insulin  glargine-yfgn (SEMGLEE ) injection 10 Units (has no administration in time range)  insulin  aspart (novoLOG ) injection 0-9 Units (has no administration in time range)  insulin  aspart (novoLOG ) injection 0-5 Units (has no administration in time range)  furosemide  (LASIX ) injection 40 mg (40 mg Intravenous Given 07/17/23 0430)    Mobility walks     Focused Assessments Pulmonary Assessment Handoff:  Lung sounds:   O2 Device: Nasal Cannula O2 Flow Rate (L/min): 3 L/min    R Recommendations: See Admitting Provider Note  Report given to:   Additional Notes:

## 2023-07-17 NOTE — ED Notes (Signed)
 Patient assisted with bedpan. Void x1.

## 2023-07-17 NOTE — Progress Notes (Signed)
  Echocardiogram 2D Echocardiogram has been performed.  Holly Baldwin 07/17/2023, 10:42 AM

## 2023-07-17 NOTE — Hospital Course (Addendum)
 78 year old female with a history of hypertension, hyperlipidemia, diabetes mellitus type 2, HFpEF, hypothyroidism, CKD stage III, TIA, anxiety presenting with 2-week history of shortness of breath and orthopnea.  The patient has noted increasing lower extremity edema as well as increasing abdominal girth.  She states that she has been sleeping on a recliner for a few days prior to admission. In the ED, the patient was afebrile and hemodynamically stable.  Oxygen  saturation was 89% on room air.  She was placed on 2 L with saturation up to 92%.  WBC 11.8, hemoglobin 10.0, platelets 272.  Sodium 136, potassium 4.5, bicarbonate 24, serum creatinine 2.4.  LFTs were unremarkable.  Chest x-ray showed increased vascular congestion and interstitial markings.  BNP 957.  Troponin 29>> 42.  The patient was started on IV furosemide . Updated echocardiogram on 07/17/2023 showed EF 20-25%.  Cardiology was consulted to assist with management.

## 2023-07-18 DIAGNOSIS — I5021 Acute systolic (congestive) heart failure: Secondary | ICD-10-CM | POA: Diagnosis not present

## 2023-07-18 DIAGNOSIS — J9601 Acute respiratory failure with hypoxia: Secondary | ICD-10-CM

## 2023-07-18 LAB — COMPREHENSIVE METABOLIC PANEL WITH GFR
ALT: 11 U/L (ref 0–44)
AST: 16 U/L (ref 15–41)
Albumin: 3.4 g/dL — ABNORMAL LOW (ref 3.5–5.0)
Alkaline Phosphatase: 88 U/L (ref 38–126)
Anion gap: 12 (ref 5–15)
BUN: 42 mg/dL — ABNORMAL HIGH (ref 8–23)
CO2: 28 mmol/L (ref 22–32)
Calcium: 9 mg/dL (ref 8.9–10.3)
Chloride: 96 mmol/L — ABNORMAL LOW (ref 98–111)
Creatinine, Ser: 1.99 mg/dL — ABNORMAL HIGH (ref 0.44–1.00)
GFR, Estimated: 25 mL/min — ABNORMAL LOW (ref >60)
Glucose, Bld: 134 mg/dL — ABNORMAL HIGH (ref 70–99)
Potassium: 3.7 mmol/L (ref 3.5–5.1)
Sodium: 136 mmol/L (ref 135–145)
Total Bilirubin: 0.7 mg/dL (ref 0.0–1.2)
Total Protein: 6.7 g/dL (ref 6.5–8.1)

## 2023-07-18 LAB — CBC
HCT: 34.1 % — ABNORMAL LOW (ref 36.0–46.0)
Hemoglobin: 10.3 g/dL — ABNORMAL LOW (ref 12.0–15.0)
MCH: 23.7 pg — ABNORMAL LOW (ref 26.0–34.0)
MCHC: 30.2 g/dL (ref 30.0–36.0)
MCV: 78.6 fL — ABNORMAL LOW (ref 80.0–100.0)
Platelets: 304 10*3/uL (ref 150–400)
RBC: 4.34 MIL/uL (ref 3.87–5.11)
RDW: 15.7 % — ABNORMAL HIGH (ref 11.5–15.5)
WBC: 9.7 10*3/uL (ref 4.0–10.5)
nRBC: 0 % (ref 0.0–0.2)

## 2023-07-18 LAB — PHOSPHORUS: Phosphorus: 4.1 mg/dL (ref 2.5–4.6)

## 2023-07-18 LAB — GLUCOSE, CAPILLARY
Glucose-Capillary: 132 mg/dL — ABNORMAL HIGH (ref 70–99)
Glucose-Capillary: 165 mg/dL — ABNORMAL HIGH (ref 70–99)
Glucose-Capillary: 131 mg/dL — ABNORMAL HIGH (ref 70–99)
Glucose-Capillary: 176 mg/dL — ABNORMAL HIGH (ref 70–99)

## 2023-07-18 LAB — MAGNESIUM: Magnesium: 2.2 mg/dL (ref 1.7–2.4)

## 2023-07-18 MED ORDER — GUAIFENESIN-DM 100-10 MG/5ML PO SYRP
5.0000 mL | ORAL_SOLUTION | ORAL | Status: DC | PRN
  Administered 2023-07-18 – 2023-07-19 (×3): 5 mL via ORAL
  Filled 2023-07-18 (×3): qty 5

## 2023-07-18 NOTE — Plan of Care (Signed)
  Problem: Health Behavior/Discharge Planning: Goal: Ability to manage health-related needs will improve Outcome: Progressing   Problem: Clinical Measurements: Goal: Ability to maintain clinical measurements within normal limits will improve Outcome: Progressing Goal: Will remain free from infection Outcome: Progressing Goal: Diagnostic test results will improve Outcome: Progressing   Problem: Activity: Goal: Risk for activity intolerance will decrease Outcome: Progressing   Problem: Coping: Goal: Level of anxiety will decrease Outcome: Progressing   Problem: Elimination: Goal: Will not experience complications related to bowel motility Outcome: Progressing Goal: Will not experience complications related to urinary retention Outcome: Progressing   Problem: Pain Managment: Goal: General experience of comfort will improve and/or be controlled Outcome: Progressing   Problem: Safety: Goal: Ability to remain free from injury will improve Outcome: Progressing   Problem: Skin Integrity: Goal: Risk for impaired skin integrity will decrease Outcome: Progressing   Problem: Coping: Goal: Ability to adjust to condition or change in health will improve Outcome: Progressing

## 2023-07-18 NOTE — Progress Notes (Signed)
 PROGRESS NOTE  Holly Baldwin CZY:[id number] DOB: 01/21/1946 DOA: 07/17/2023 PCP: Minus Amel, MD  Brief History:  78 year old female with a history of hypertension, hyperlipidemia, diabetes mellitus type 2, HFpEF, hypothyroidism, CKD stage III, TIA, anxiety presenting with 2-week history of shortness of breath and orthopnea.  The patient has noted increasing lower extremity edema as well as increasing abdominal girth.  She states that she has been sleeping on a recliner for a few days prior to admission. In the ED, the patient was afebrile and hemodynamically stable.  Oxygen  saturation was 89% on room air.  She was placed on 2 L with saturation up to 92%.  WBC 11.8, hemoglobin 10.0, platelets 272.  Sodium 136, potassium 4.5, bicarbonate 24, serum creatinine 2.4.  LFTs were unremarkable.  Chest x-ray showed increased vascular congestion and interstitial markings.  BNP 957.  Troponin 29>> 42.  The patient was started on IV furosemide . Updated echocardiogram on 07/17/2023 showed EF 20-25%.  Cardiology was consulted to assist with management.   Assessment/Plan: Acute HFrEF - Continue IV furosemide  - Remains clinically fluid overloaded - Appreciate cardiology consult - 07/17/2023 echo EF 20-25%, mild TR, normal RVF, PASP 72 - Daily weights--NEG 10 lb - Accurate I's and O's--incomplete - Imdur  added to hydralazine    New onset atrial fibrillation - Continueapixaban -CHADSVASc2 =  5 - Echocardiogram as above - Continue Lopressor  p.o. 25 mg every 6 hours with hold parameters - HRs primarily in 90s - plan to consolidate to metoprolol  succinate if remains controlled   Acute respiratory failure with hypoxia -stable on 2L -due to pulm edema -initially 89% on RA -wean oxygen  as tolerated   CKD stage IV - Baseline creatinine 1.8-2.2 - Continue olmesartan  for now and monitor renal function closely - daily BMP with diuresis   Essential hypertension - Imdur  added to hydralazine  - plan  to consolidate metoprolol  if HR remains controlled   Diabetes mellitus type 2 with hyperglycemia - 05/01/2023 hemoglobin A1c 7.0 - Reduced dose Semglee  - NovoLog  sliding scale   Obesity - BMI 30.65 - Lifestyle modification   Hypothyroidism - Continue Synthroid    Mixed hyperlipidemia - Continue statin     Total time spent 50 minutes.  Greater than 50% spent face to face counseling and coordinating care.       Family Communication:  no Family at bedside   Consultants:  cardiology   Code Status:  FULL    DVT Prophylaxis: apixaban      Procedures: As Listed in Progress Note Above   Antibiotics: None     Subjective: Patient denies fevers, chills, headache, chest pain,  nausea, vomiting, diarrhea, abdominal pain, dysuria, hematuria, hematochezia, and melena. Complains of cough--nonproductive, no hemoptysis  Objective: Vitals:   07/18/23 0018 07/18/23 0416 07/18/23 0437 07/18/23 1341  BP: 119/89 (!) 141/80  125/77  Pulse: 88 97  90  Resp: 16 16  20   Temp: 97.8 F (36.6 C) 98.3 F (36.8 C)  98.8 F (37.1 C)  TempSrc: Oral   Oral  SpO2: 97% 99%  99%  Weight:   76.6 kg   Height:        Intake/Output Summary (Last 24 hours) at 07/18/2023 1830 Last data filed at 07/18/2023 1033 Gross per 24 hour  Intake 960 ml  Output 600 ml  Net 360 ml   Weight change: -4.433 kg Exam:  General:  Pt is alert, follows commands appropriately, not in acute distress HEENT: No icterus, No thrush,  No neck mass, [place]/AT Cardiovascular: IRRR, S1/S2, no rubs, no gallops Respiratory: bibasilar crackles. No wheeze Abdomen: Soft/+BS, non tender, non distended, no guarding Extremities: No edema, No lymphangitis, No petechiae, No rashes, no synovitis   Data Reviewed: I have personally reviewed following labs and imaging studies Basic Metabolic Panel: Recent Labs  Lab 07/17/23 0239 07/17/23 0428 07/18/23 0403  NA 136  --  136  K 4.5  --  3.7  CL 99  --  96*  CO2 27  --  28   GLUCOSE 186*  --  134*  BUN 36*  --  42*  CREATININE 2.14*  --  1.99*  CALCIUM  9.1  --  9.0  MG  --  2.5* 2.2  PHOS  --   --  4.1   Liver Function Tests: Recent Labs  Lab 07/17/23 0239 07/18/23 0403  AST 14* 16  ALT 13 11  ALKPHOS 98 88  BILITOT 0.8 0.7  PROT 6.8 6.7  ALBUMIN 3.6 3.4*   No results for input(s): "LIPASE", "AMYLASE" in the last 168 hours. No results for input(s): "AMMONIA" in the last 168 hours. Coagulation Profile: No results for input(s): "INR", "PROTIME" in the last 168 hours. CBC: Recent Labs  Lab 07/17/23 0239 07/18/23 0403  WBC 11.8* 9.7  NEUTROABS 10.0*  --   HGB 10.0* 10.3*  HCT 31.6* 34.1*  MCV 76.3* 78.6*  PLT 272 304   Cardiac Enzymes: No results for input(s): "CKTOTAL", "CKMB", "CKMBINDEX", "TROPONINI" in the last 168 hours. BNP: Invalid input(s): "POCBNP" CBG: Recent Labs  Lab 07/17/23 1746 07/17/23 2024 07/18/23 0755 07/18/23 1114 07/18/23 1619  GLUCAP 210* 142* 132* 165* 131*   HbA1C: No results for input(s): "HGBA1C" in the last 72 hours. Urine analysis:    Component Value Date/Time   COLORURINE STRAW (A) 12/25/2020 1420   APPEARANCEUR CLEAR 12/25/2020 1420   LABSPEC 1.006 12/25/2020 1420   PHURINE 5.0 12/25/2020 1420   GLUCOSEU NEGATIVE 12/25/2020 1420   HGBUR SMALL (A) 12/25/2020 1420   BILIRUBINUR NEGATIVE 12/25/2020 1420   KETONESUR NEGATIVE 12/25/2020 1420   PROTEINUR 100 (A) 12/25/2020 1420   UROBILINOGEN 1.0 10/10/2014 1120   NITRITE NEGATIVE 12/25/2020 1420   LEUKOCYTESUR NEGATIVE 12/25/2020 1420   Sepsis Labs: @LABRCNTIP (procalcitonin:4,lacticidven:4) )No results found for this or any previous visit (from the past 240 hours).   Scheduled Meds:  apixaban   5 mg Oral BID   feeding supplement  237 mL Oral BID BM   furosemide   40 mg Intravenous Q12H   hydrALAZINE   50 mg Oral BID   insulin  aspart  0-5 Units Subcutaneous QHS   insulin  aspart  0-9 Units Subcutaneous TID WC   insulin  glargine-yfgn  10 Units  Subcutaneous QHS   isosorbide  mononitrate  30 mg Oral Daily   levothyroxine   112 mcg Oral QAC breakfast   metoprolol  tartrate  25 mg Oral QID   polyethylene glycol  17 g Oral Daily   senna  2 tablet Oral Daily   simvastatin   40 mg Oral q1800   Continuous Infusions:  Procedures/Studies: ECHOCARDIOGRAM COMPLETE Result Date: 07/17/2023    ECHOCARDIOGRAM REPORT   Patient Name:   Holly Baldwin Date of Exam: 07/17/2023 Medical Rec #:  [id number]    Height:       64.0 in Accession #:    [id number]   Weight:       178.6 lb Date of Birth:  September 15, 1945    BSA:          1.864  m Patient Age:    47 years     BP:           135/95 mmHg Patient Gender: F            HR:           76 bpm. Exam Location:  [facility] Procedure: 2D Echo, Cardiac Doppler, Color Doppler and Intracardiac            Opacification Agent (Both Spectral and Color Flow Doppler were            utilized during procedure). Indications:    I50.40* Unspecified combined systolic (congestive) and diastolic                 (congestive) heart failure  History:        Patient has prior history of Echocardiogram examinations. CHF,                 TIA, Signs/Symptoms:Shortness of Breath and Dyspnea; Risk                 Factors:Hypertension, Dyslipidemia and Diabetes.  Sonographer:    Raynelle Callow RDCS Referring Phys: [id number] OLADAPO ADEFESO  Sonographer Comments: [id number]. IMPRESSIONS  1. Left ventricular ejection fraction, by estimation, is 20 to 25%. The left ventricle has severely decreased function. The left ventricle demonstrates global hypokinesis. There is moderate left ventricular hypertrophy. Left ventricular diastolic parameters are indeterminate.  2. Right ventricular systolic function is normal. The right ventricular size is normal. There is severely elevated pulmonary artery systolic pressure. The estimated right ventricular systolic pressure is 72.2 mmHg.  3. Left atrial size was mildly dilated.  4. The mitral valve is abnormal. Moderate mitral valve  regurgitation. No evidence of mitral stenosis.  5. The tricuspid valve is abnormal.  6. The aortic valve is tricuspid. Aortic valve regurgitation is not visualized. No aortic stenosis is present.  7. The inferior vena cava is dilated in size with <50% respiratory variability, suggesting right atrial pressure of 15 mmHg. FINDINGS  Left Ventricle: Question of possible prominent apical trabeculations of standard 2D imaging, however with contrast does not appear to be significant. Left ventricular ejection fraction, by estimation, is 20 to 25%. The left ventricle has severely decreased function. The left ventricle demonstrates global hypokinesis. Definity  contrast agent was given IV to delineate the left ventricular endocardial borders. The left ventricular internal cavity size was normal in size. There is moderate left ventricular hypertrophy. Left ventricular diastolic parameters are indeterminate. Right Ventricle: The right ventricular size is normal. Right vetricular wall thickness was not well visualized. Right ventricular systolic function is normal. There is severely elevated pulmonary artery systolic pressure. The tricuspid regurgitant velocity is 3.78 m/s, and with an assumed right atrial pressure of 15 mmHg, the estimated right ventricular systolic pressure is 72.2 mmHg. Left Atrium: Left atrial size was mildly dilated. Right Atrium: Right atrial size was normal in size. Pericardium: There is no evidence of pericardial effusion. Mitral Valve: The mitral valve is abnormal. Moderate mitral valve regurgitation. No evidence of mitral valve stenosis. MV peak gradient, 8.6 mmHg. The mean mitral valve gradient is 3.0 mmHg. Tricuspid Valve: The tricuspid valve is abnormal. Tricuspid valve regurgitation is mild . No evidence of tricuspid stenosis. Aortic Valve: The aortic valve is tricuspid. Aortic valve regurgitation is not visualized. No aortic stenosis is present. Aortic valve mean gradient measures 4.0 mmHg. Aortic  valve peak gradient measures 8.7 mmHg. Aortic valve area, by VTI measures 2.86 cm. Pulmonic Valve:  The pulmonic valve was not well visualized. Pulmonic valve regurgitation is not visualized. No evidence of pulmonic stenosis. Aorta: The aortic root and ascending aorta are structurally normal, with no evidence of dilitation. Venous: The inferior vena cava is dilated in size with less than 50% respiratory variability, suggesting right atrial pressure of 15 mmHg. IAS/Shunts: The interatrial septum was not well visualized.  LEFT VENTRICLE PLAX 2D LVIDd:         4.87 cm      Diastology LVIDs:         4.63 cm      LV e' medial:    5.42 cm/s LV PW:         1.30 cm      LV E/e' medial:  22.5 LV IVS:        1.30 cm      LV e' lateral:   10.40 cm/s LVOT diam:     2.30 cm      LV E/e' lateral: 11.7 LV SV:         67 LV SV Index:   36 LVOT Area:     4.15 cm  LV Volumes (MOD) LV vol d, MOD A2C: 228.0 ml LV vol d, MOD A4C: 227.0 ml LV vol s, MOD A2C: 217.0 ml LV vol s, MOD A4C: 199.0 ml LV SV MOD A2C:     11.0 ml LV SV MOD A4C:     227.0 ml LV SV MOD BP:      16.4 ml RIGHT VENTRICLE             IVC RV S prime:     14.60 cm/s  IVC diam: 2.52 cm TAPSE (M-mode): 1.8 cm LEFT ATRIUM             Index        RIGHT ATRIUM           Index LA diam:        2.90 cm 1.56 cm/m   RA Area:     16.80 cm LA Vol (A2C):   55.5 ml 29.77 ml/m  RA Volume:   53.70 ml  28.80 ml/m LA Vol (A4C):   71.6 ml 38.40 ml/m LA Biplane Vol: 65.7 ml 35.24 ml/m  AORTIC VALVE AV Area (Vmax):    3.30 cm AV Area (Vmean):   2.98 cm AV Area (VTI):     2.86 cm AV Vmax:           147.39 cm/s AV Vmean:          93.612 cm/s AV VTI:            0.235 m AV Peak Grad:      8.7 mmHg AV Mean Grad:      4.0 mmHg LVOT Vmax:         117.00 cm/s LVOT Vmean:        67.100 cm/s LVOT VTI:          0.162 m LVOT/AV VTI ratio: 0.69  AORTA Ao Root diam: 3.30 cm Ao Asc diam:  2.70 cm MITRAL VALVE                  TRICUSPID VALVE MV Area (PHT): 5.66 cm       TR Peak grad:   57.2  mmHg MV Area VTI:   2.72 cm       TR Vmax:        378.00 cm/s MV Peak grad:  8.6 mmHg MV Mean grad:  3.0 mmHg       SHUNTS MV Vmax:       1.47 m/s       Systemic VTI:  0.16 m MV Vmean:      77.5 cm/s      Systemic Diam: 2.30 cm MV Decel Time: 134 msec MR Peak grad:    94.9 mmHg MR Mean grad:    51.0 mmHg MR Vmax:         487.00 cm/s MR Vmean:        328.0 cm/s MR PISA:         1.01 cm MR PISA Eff ROA: 8 mm MR PISA Radius:  0.40 cm MV E velocity: 122.00 cm/s MV A velocity: 88.10 cm/s MV E/A ratio:  1.38 Armida Lander MD Electronically signed by Armida Lander MD Signature Date/Time: 07/17/2023/12:16:21 PM    Final    DG Chest Portable 1 View Result Date: 07/17/2023 CLINICAL DATA:  Dyspnea fibrosis. EXAM: PORTABLE CHEST 1 VIEW COMPARISON:  Portable chest 11/01/2021 FINDINGS: The heart has moderately enlarged since the prior study. There is increased vascular engorgement. There is mild generalized interstitial consolidation consistent with edema, with a basal gradient and small pleural effusions. There is patchy haziness in the lung bases which could be hazy atelectasis, ground-glass edema or pneumonitis. No other focal infiltrate is seen. The mediastinum is stable with aortic tortuosity and atherosclerosis. Thoracic spondylosis with no new osseous finding. IMPRESSION: 1. Interval development of moderate cardiomegaly with increased vascular engorgement, with mild generalized interstitial consolidation consistent with edema. 2. Small pleural effusions. 3. Patchy haziness in the lung bases which could be hazy atelectasis, ground-glass edema or pneumonitis. 4. Clinical correlation and radiographic follow-up recommended. Electronically Signed   By: Denman Fischer M.D.   On: 07/17/2023 03:21    Demaris Fillers, DO  [facility]  If 7PM-7AM, please contact night-coverage [email or web address] Password [email or web address] 07/18/2023, 6:30 PM   LOS: 1 day

## 2023-07-19 DIAGNOSIS — I5021 Acute systolic (congestive) heart failure: Secondary | ICD-10-CM | POA: Diagnosis not present

## 2023-07-19 DIAGNOSIS — I10 Essential (primary) hypertension: Secondary | ICD-10-CM

## 2023-07-19 DIAGNOSIS — N184 Chronic kidney disease, stage 4 (severe): Secondary | ICD-10-CM | POA: Diagnosis not present

## 2023-07-19 LAB — BASIC METABOLIC PANEL WITH GFR
Anion gap: 9 (ref 5–15)
BUN: 50 mg/dL — ABNORMAL HIGH (ref 8–23)
CO2: 25 mmol/L (ref 22–32)
Calcium: 8.4 mg/dL — ABNORMAL LOW (ref 8.9–10.3)
Chloride: 97 mmol/L — ABNORMAL LOW (ref 98–111)
Creatinine, Ser: 2.04 mg/dL — ABNORMAL HIGH (ref 0.44–1.00)
GFR, Estimated: 24 mL/min — ABNORMAL LOW (ref >60)
Glucose, Bld: 137 mg/dL — ABNORMAL HIGH (ref 70–99)
Potassium: 3.3 mmol/L — ABNORMAL LOW (ref 3.5–5.1)
Sodium: 131 mmol/L — ABNORMAL LOW (ref 135–145)

## 2023-07-19 LAB — GLUCOSE, CAPILLARY
Glucose-Capillary: 142 mg/dL — ABNORMAL HIGH (ref 70–99)
Glucose-Capillary: 171 mg/dL — ABNORMAL HIGH (ref 70–99)
Glucose-Capillary: 139 mg/dL — ABNORMAL HIGH (ref 70–99)

## 2023-07-19 LAB — MAGNESIUM: Magnesium: 2 mg/dL (ref 1.7–2.4)

## 2023-07-19 MED ORDER — BENZONATATE 100 MG PO CAPS
100.0000 mg | ORAL_CAPSULE | Freq: Three times a day (TID) | ORAL | Status: DC
  Administered 2023-07-19 – 2023-07-20 (×2): 100 mg via ORAL
  Filled 2023-07-19 (×2): qty 1

## 2023-07-19 MED ORDER — POTASSIUM CHLORIDE CRYS ER 20 MEQ PO TBCR
40.0000 meq | EXTENDED_RELEASE_TABLET | Freq: Once | ORAL | Status: AC
  Administered 2023-07-19: 40 meq via ORAL
  Filled 2023-07-19: qty 2

## 2023-07-19 MED ORDER — BUDESONIDE 0.5 MG/2ML IN SUSP
0.5000 mg | Freq: Two times a day (BID) | RESPIRATORY_TRACT | Status: DC
  Administered 2023-07-19 – 2023-07-20 (×2): 0.5 mg via RESPIRATORY_TRACT
  Filled 2023-07-19 (×2): qty 2

## 2023-07-19 MED ORDER — LEVALBUTEROL HCL 0.63 MG/3ML IN NEBU
0.6300 mg | INHALATION_SOLUTION | Freq: Two times a day (BID) | RESPIRATORY_TRACT | Status: DC
  Administered 2023-07-19 – 2023-07-20 (×2): 0.63 mg via RESPIRATORY_TRACT
  Filled 2023-07-19 (×2): qty 3

## 2023-07-19 MED ORDER — FUROSEMIDE 40 MG PO TABS
40.0000 mg | ORAL_TABLET | Freq: Two times a day (BID) | ORAL | Status: DC
  Administered 2023-07-20: 40 mg via ORAL
  Filled 2023-07-19 (×2): qty 1

## 2023-07-19 MED ORDER — METOPROLOL SUCCINATE ER 50 MG PO TB24
100.0000 mg | ORAL_TABLET | Freq: Every day | ORAL | Status: DC
  Administered 2023-07-19: 100 mg via ORAL
  Filled 2023-07-19: qty 2

## 2023-07-19 NOTE — Progress Notes (Signed)
 PROGRESS NOTE  CADIE SORCI ZOX:[id number] DOB: May 03, 1945 DOA: 07/17/2023 PCP: Minus Amel, MD  Brief History:  78 year old female with a history of hypertension, hyperlipidemia, diabetes mellitus type 2, HFpEF, hypothyroidism, CKD stage III, TIA, anxiety presenting with 2-week history of shortness of breath and orthopnea.  The patient has noted increasing lower extremity edema as well as increasing abdominal girth.  She states that she has been sleeping on a recliner for a few days prior to admission. In the ED, the patient was afebrile and hemodynamically stable.  Oxygen  saturation was 89% on room air.  She was placed on 2 L with saturation up to 92%.  WBC 11.8, hemoglobin 10.0, platelets 272.  Sodium 136, potassium 4.5, bicarbonate 24, serum creatinine 2.4.  LFTs were unremarkable.  Chest x-ray showed increased vascular congestion and interstitial markings.  BNP 957.  Troponin 29>> 42.  The patient was started on IV furosemide . Updated echocardiogram on 07/17/2023 showed EF 20-25%.  Cardiology was consulted to assist with management.   Assessment/Plan: Acute HFrEF - Continue IV furosemide  - Remains clinically fluid overloaded - Appreciate cardiology consult - 07/17/2023 echo EF 20-25%, mild TR, normal RVF, PASP 72 - Daily weights-- NEG 14 lbs - Accurate I's and O's - Imdur  added to hydralazine  -ReDS = 34   New onset atrial fibrillation - Started apixaban  -CHADSVASc2 =  5 - Echocardiogram as above - Start Lopressor  p.o. 25 mg every 6 hours with hold parameters>>transitioned to metoprolol  succinate 100 mg daily   Acute respiratory failure with hypoxia -stable on 2L>>weaned to RA -due to pulm edema -initially 89% on RA -wean oxygen  as tolerated   CKD stage IV - Baseline creatinine 1.8-2.2 - d/c olmesartan   Cough -??due to ARB -start pantoprazole  -start pulmicort and albuterol  -repeat CXR in am   Essential hypertension - Imdur  added to hydralazine  - Continue  olmesartan  for now and monitor renal function closely   Diabetes mellitus type 2 with hyperglycemia - 05/01/2023 hemoglobin A1c 7.0 - Reduced dose Semglee  - NovoLog  sliding scale   Obesity - BMI 30.65 - Lifestyle modification   Hypothyroidism - Continue Synthroid    Mixed hyperlipidemia - Continue statin  Hypokalemia -replete -mag 2.0         Family Communication:  no Family at bedside   Consultants:  cardiology   Code Status:  FULL    DVT Prophylaxis: apixaban      Procedures: As Listed in Progress Note Above   Antibiotics: None       Subjective: Pt states she is breathing better but complains of dry cough.  Denies f/,c cp, n/v/d, abd pain  Objective: Vitals:   07/18/23 1341 07/18/23 2003 07/19/23 0505 07/19/23 1318  BP: 125/77 120/65 113/70 (!) 110/58  Pulse: 90 92 85 83  Resp: 20 18 18 20   Temp: 98.8 F (37.1 C) 99.2 F (37.3 C) 98.2 F (36.8 C) 98.2 F (36.8 C)  TempSrc: Oral Oral Oral Oral  SpO2: 99% 99% 97% 98%  Weight:   74.5 kg   Height:        Intake/Output Summary (Last 24 hours) at 07/19/2023 1821 Last data filed at 07/19/2023 1319 Gross per 24 hour  Intake 480 ml  Output --  Net 480 ml   Weight change: -2.067 kg Exam:  General:  Pt is alert, follows commands appropriately, not in acute distress HEENT: No icterus, No thrush, No neck mass, [place]/AT Cardiovascular: RRR, S1/S2, no rubs, no gallops Respiratory: fine  bibasilar crackles.  No wheeze Abdomen: Soft/+BS, non tender, non distended, no guarding Extremities: trace LE edema, No lymphangitis, No petechiae, No rashes, no synovitis   Data Reviewed: I have personally reviewed following labs and imaging studies Basic Metabolic Panel: Recent Labs  Lab 07/17/23 0239 07/17/23 0428 07/18/23 0403 07/19/23 0423  NA 136  --  136 131*  K 4.5  --  3.7 3.3*  CL 99  --  96* 97*  CO2 27  --  28 25  GLUCOSE 186*  --  134* 137*  BUN 36*  --  42* 50*  CREATININE 2.14*  --  1.99* 2.04*   CALCIUM  9.1  --  9.0 8.4*  MG  --  2.5* 2.2 2.0  PHOS  --   --  4.1  --    Liver Function Tests: Recent Labs  Lab 07/17/23 0239 07/18/23 0403  AST 14* 16  ALT 13 11  ALKPHOS 98 88  BILITOT 0.8 0.7  PROT 6.8 6.7  ALBUMIN 3.6 3.4*   No results for input(s): "LIPASE", "AMYLASE" in the last 168 hours. No results for input(s): "AMMONIA" in the last 168 hours. Coagulation Profile: No results for input(s): "INR", "PROTIME" in the last 168 hours. CBC: Recent Labs  Lab 07/17/23 0239 07/18/23 0403  WBC 11.8* 9.7  NEUTROABS 10.0*  --   HGB 10.0* 10.3*  HCT 31.6* 34.1*  MCV 76.3* 78.6*  PLT 272 304   Cardiac Enzymes: No results for input(s): "CKTOTAL", "CKMB", "CKMBINDEX", "TROPONINI" in the last 168 hours. BNP: Invalid input(s): "POCBNP" CBG: Recent Labs  Lab 07/18/23 1619 07/18/23 2000 07/19/23 0733 07/19/23 1117 07/19/23 1650  GLUCAP 131* 176* 142* 171* 139*   HbA1C: No results for input(s): "HGBA1C" in the last 72 hours. Urine analysis:    Component Value Date/Time   COLORURINE STRAW (A) 12/25/2020 1420   APPEARANCEUR CLEAR 12/25/2020 1420   LABSPEC 1.006 12/25/2020 1420   PHURINE 5.0 12/25/2020 1420   GLUCOSEU NEGATIVE 12/25/2020 1420   HGBUR SMALL (A) 12/25/2020 1420   BILIRUBINUR NEGATIVE 12/25/2020 1420   KETONESUR NEGATIVE 12/25/2020 1420   PROTEINUR 100 (A) 12/25/2020 1420   UROBILINOGEN 1.0 10/10/2014 1120   NITRITE NEGATIVE 12/25/2020 1420   LEUKOCYTESUR NEGATIVE 12/25/2020 1420   Sepsis Labs: @LABRCNTIP (procalcitonin:4,lacticidven:4) )No results found for this or any previous visit (from the past 240 hours).   Scheduled Meds:  apixaban   5 mg Oral BID   feeding supplement  237 mL Oral BID BM   furosemide   40 mg Intravenous Q12H   hydrALAZINE   50 mg Oral BID   insulin  aspart  0-5 Units Subcutaneous QHS   insulin  aspart  0-9 Units Subcutaneous TID WC   insulin  glargine-yfgn  10 Units Subcutaneous QHS   isosorbide  mononitrate  30 mg Oral  Daily   levothyroxine   112 mcg Oral QAC breakfast   metoprolol  succinate  100 mg Oral Daily   polyethylene glycol  17 g Oral Daily   potassium chloride   40 mEq Oral Once   senna  2 tablet Oral Daily   simvastatin   40 mg Oral q1800   Continuous Infusions:  Procedures/Studies: ECHOCARDIOGRAM COMPLETE Result Date: 07/17/2023    ECHOCARDIOGRAM REPORT   Patient Name:   JLA REYNOLDS Cominsky Date of Exam: 07/17/2023 Medical Rec #:  [id number]    Height:       64.0 in Accession #:    [id number]   Weight:       178.6 lb Date of Birth:  [birth year]  BSA:          1.864 m Patient Age:    78 years     BP:           135/95 mmHg Patient Gender: F            HR:           76 bpm. Exam Location:  [facility] Procedure: 2D Echo, Cardiac Doppler, Color Doppler and Intracardiac            Opacification Agent (Both Spectral and Color Flow Doppler were            utilized during procedure). Indications:    I50.40* Unspecified combined systolic (congestive) and diastolic                 (congestive) heart failure  History:        Patient has prior history of Echocardiogram examinations. CHF,                 TIA, Signs/Symptoms:Shortness of Breath and Dyspnea; Risk                 Factors:Hypertension, Dyslipidemia and Diabetes.  Sonographer:    Raynelle Callow RDCS Referring Phys: [id number] OLADAPO ADEFESO  Sonographer Comments: [id number]. IMPRESSIONS  1. Left ventricular ejection fraction, by estimation, is 20 to 25%. The left ventricle has severely decreased function. The left ventricle demonstrates global hypokinesis. There is moderate left ventricular hypertrophy. Left ventricular diastolic parameters are indeterminate.  2. Right ventricular systolic function is normal. The right ventricular size is normal. There is severely elevated pulmonary artery systolic pressure. The estimated right ventricular systolic pressure is 72.2 mmHg.  3. Left atrial size was mildly dilated.  4. The mitral valve is abnormal. Moderate mitral valve regurgitation.  No evidence of mitral stenosis.  5. The tricuspid valve is abnormal.  6. The aortic valve is tricuspid. Aortic valve regurgitation is not visualized. No aortic stenosis is present.  7. The inferior vena cava is dilated in size with <50% respiratory variability, suggesting right atrial pressure of 15 mmHg. FINDINGS  Left Ventricle: Question of possible prominent apical trabeculations of standard 2D imaging, however with contrast does not appear to be significant. Left ventricular ejection fraction, by estimation, is 20 to 25%. The left ventricle has severely decreased function. The left ventricle demonstrates global hypokinesis. Definity  contrast agent was given IV to delineate the left ventricular endocardial borders. The left ventricular internal cavity size was normal in size. There is moderate left ventricular hypertrophy. Left ventricular diastolic parameters are indeterminate. Right Ventricle: The right ventricular size is normal. Right vetricular wall thickness was not well visualized. Right ventricular systolic function is normal. There is severely elevated pulmonary artery systolic pressure. The tricuspid regurgitant velocity is 3.78 m/s, and with an assumed right atrial pressure of 15 mmHg, the estimated right ventricular systolic pressure is 72.2 mmHg. Left Atrium: Left atrial size was mildly dilated. Right Atrium: Right atrial size was normal in size. Pericardium: There is no evidence of pericardial effusion. Mitral Valve: The mitral valve is abnormal. Moderate mitral valve regurgitation. No evidence of mitral valve stenosis. MV peak gradient, 8.6 mmHg. The mean mitral valve gradient is 3.0 mmHg. Tricuspid Valve: The tricuspid valve is abnormal. Tricuspid valve regurgitation is mild . No evidence of tricuspid stenosis. Aortic Valve: The aortic valve is tricuspid. Aortic valve regurgitation is not visualized. No aortic stenosis is present. Aortic valve mean gradient measures 4.0 mmHg. Aortic valve peak  gradient measures 8.7  mmHg. Aortic valve area, by VTI measures 2.86 cm. Pulmonic Valve: The pulmonic valve was not well visualized. Pulmonic valve regurgitation is not visualized. No evidence of pulmonic stenosis. Aorta: The aortic root and ascending aorta are structurally normal, with no evidence of dilitation. Venous: The inferior vena cava is dilated in size with less than 50% respiratory variability, suggesting right atrial pressure of 15 mmHg. IAS/Shunts: The interatrial septum was not well visualized.  LEFT VENTRICLE PLAX 2D LVIDd:         4.87 cm      Diastology LVIDs:         4.63 cm      LV e' medial:    5.42 cm/s LV PW:         1.30 cm      LV E/e' medial:  22.5 LV IVS:        1.30 cm      LV e' lateral:   10.40 cm/s LVOT diam:     2.30 cm      LV E/e' lateral: 11.7 LV SV:         67 LV SV Index:   36 LVOT Area:     4.15 cm  LV Volumes (MOD) LV vol d, MOD A2C: 228.0 ml LV vol d, MOD A4C: 227.0 ml LV vol s, MOD A2C: 217.0 ml LV vol s, MOD A4C: 199.0 ml LV SV MOD A2C:     11.0 ml LV SV MOD A4C:     227.0 ml LV SV MOD BP:      16.4 ml RIGHT VENTRICLE             IVC RV S prime:     14.60 cm/s  IVC diam: 2.52 cm TAPSE (M-mode): 1.8 cm LEFT ATRIUM             Index        RIGHT ATRIUM           Index LA diam:        2.90 cm 1.56 cm/m   RA Area:     16.80 cm LA Vol (A2C):   55.5 ml 29.77 ml/m  RA Volume:   53.70 ml  28.80 ml/m LA Vol (A4C):   71.6 ml 38.40 ml/m LA Biplane Vol: 65.7 ml 35.24 ml/m  AORTIC VALVE AV Area (Vmax):    3.30 cm AV Area (Vmean):   2.98 cm AV Area (VTI):     2.86 cm AV Vmax:           147.39 cm/s AV Vmean:          93.612 cm/s AV VTI:            0.235 m AV Peak Grad:      8.7 mmHg AV Mean Grad:      4.0 mmHg LVOT Vmax:         117.00 cm/s LVOT Vmean:        67.100 cm/s LVOT VTI:          0.162 m LVOT/AV VTI ratio: 0.69  AORTA Ao Root diam: 3.30 cm Ao Asc diam:  2.70 cm MITRAL VALVE                  TRICUSPID VALVE MV Area (PHT): 5.66 cm       TR Peak grad:   57.2 mmHg MV Area  VTI:   2.72 cm       TR Vmax:  378.00 cm/s MV Peak grad:  8.6 mmHg MV Mean grad:  3.0 mmHg       SHUNTS MV Vmax:       1.47 m/s       Systemic VTI:  0.16 m MV Vmean:      77.5 cm/s      Systemic Diam: 2.30 cm MV Decel Time: 134 msec MR Peak grad:    94.9 mmHg MR Mean grad:    51.0 mmHg MR Vmax:         487.00 cm/s MR Vmean:        328.0 cm/s MR PISA:         1.01 cm MR PISA Eff ROA: 8 mm MR PISA Radius:  0.40 cm MV E velocity: 122.00 cm/s MV A velocity: 88.10 cm/s MV E/A ratio:  1.38 Armida Lander MD Electronically signed by Armida Lander MD Signature Date/Time: 07/17/2023/12:16:21 PM    Final    DG Chest Portable 1 View Result Date: 07/17/2023 CLINICAL DATA:  Dyspnea fibrosis. EXAM: PORTABLE CHEST 1 VIEW COMPARISON:  Portable chest 11/01/2021 FINDINGS: The heart has moderately enlarged since the prior study. There is increased vascular engorgement. There is mild generalized interstitial consolidation consistent with edema, with a basal gradient and small pleural effusions. There is patchy haziness in the lung bases which could be hazy atelectasis, ground-glass edema or pneumonitis. No other focal infiltrate is seen. The mediastinum is stable with aortic tortuosity and atherosclerosis. Thoracic spondylosis with no new osseous finding. IMPRESSION: 1. Interval development of moderate cardiomegaly with increased vascular engorgement, with mild generalized interstitial consolidation consistent with edema. 2. Small pleural effusions. 3. Patchy haziness in the lung bases which could be hazy atelectasis, ground-glass edema or pneumonitis. 4. Clinical correlation and radiographic follow-up recommended. Electronically Signed   By: Denman Fischer M.D.   On: 07/17/2023 03:21    Demaris Fillers, DO  [facility]  If 7PM-7AM, please contact night-coverage [email or web address] Password [email or web address] 07/19/2023, 6:21 PM   LOS: 2 days

## 2023-07-20 ENCOUNTER — Other Ambulatory Visit (HOSPITAL_COMMUNITY): Payer: Self-pay

## 2023-07-20 ENCOUNTER — Telehealth (HOSPITAL_COMMUNITY): Payer: Self-pay | Admitting: Pharmacy Technician

## 2023-07-20 DIAGNOSIS — I5021 Acute systolic (congestive) heart failure: Secondary | ICD-10-CM | POA: Diagnosis not present

## 2023-07-20 DIAGNOSIS — J9601 Acute respiratory failure with hypoxia: Secondary | ICD-10-CM | POA: Diagnosis not present

## 2023-07-20 DIAGNOSIS — N184 Chronic kidney disease, stage 4 (severe): Secondary | ICD-10-CM | POA: Diagnosis not present

## 2023-07-20 DIAGNOSIS — E66811 Obesity, class 1: Secondary | ICD-10-CM | POA: Diagnosis not present

## 2023-07-20 LAB — BASIC METABOLIC PANEL WITH GFR
Anion gap: 9 (ref 5–15)
BUN: 54 mg/dL — ABNORMAL HIGH (ref 8–23)
CO2: 25 mmol/L (ref 22–32)
Calcium: 8.7 mg/dL — ABNORMAL LOW (ref 8.9–10.3)
Chloride: 98 mmol/L (ref 98–111)
Creatinine, Ser: 2.02 mg/dL — ABNORMAL HIGH (ref 0.44–1.00)
GFR, Estimated: 25 mL/min — ABNORMAL LOW (ref >60)
Glucose, Bld: 132 mg/dL — ABNORMAL HIGH (ref 70–99)
Potassium: 4 mmol/L (ref 3.5–5.1)
Sodium: 132 mmol/L — ABNORMAL LOW (ref 135–145)

## 2023-07-20 LAB — MAGNESIUM: Magnesium: 1.9 mg/dL (ref 1.7–2.4)

## 2023-07-20 LAB — GLUCOSE, CAPILLARY
Glucose-Capillary: 142 mg/dL — ABNORMAL HIGH (ref 70–99)
Glucose-Capillary: 168 mg/dL — ABNORMAL HIGH (ref 70–99)
Glucose-Capillary: 207 mg/dL — ABNORMAL HIGH (ref 70–99)

## 2023-07-20 MED ORDER — FUROSEMIDE 40 MG PO TABS: 40.0000 mg | ORAL_TABLET | Freq: Two times a day (BID) | ORAL | 1 refills | Status: DC

## 2023-07-20 MED ORDER — PANTOPRAZOLE SODIUM 40 MG PO TBEC: 40.0000 mg | DELAYED_RELEASE_TABLET | Freq: Every day | ORAL | 1 refills | Status: DC

## 2023-07-20 MED ORDER — HYDRALAZINE HCL 50 MG PO TABS: 50.0000 mg | ORAL_TABLET | Freq: Two times a day (BID) | ORAL | 1 refills | Status: DC

## 2023-07-20 MED ORDER — ENSURE ENLIVE PO LIQD: 237.0000 mL | Freq: Two times a day (BID) | ORAL | Status: DC

## 2023-07-20 MED ORDER — METOPROLOL SUCCINATE ER 50 MG PO TB24: 150.0000 mg | ORAL_TABLET | Freq: Every day | ORAL | 1 refills | Status: DC

## 2023-07-20 MED ORDER — METOPROLOL SUCCINATE ER 50 MG PO TB24
150.0000 mg | ORAL_TABLET | Freq: Every day | ORAL | Status: DC
  Administered 2023-07-20: 150 mg via ORAL
  Filled 2023-07-20: qty 3

## 2023-07-20 MED ORDER — ALBUTEROL SULFATE HFA 108 (90 BASE) MCG/ACT IN AERS: 2.0000 | INHALATION_SPRAY | Freq: Four times a day (QID) | RESPIRATORY_TRACT | 1 refills | Status: DC | PRN

## 2023-07-20 MED ORDER — ISOSORBIDE MONONITRATE ER 30 MG PO TB24: 30.0000 mg | ORAL_TABLET | Freq: Every day | ORAL | 1 refills | Status: DC

## 2023-07-20 MED ORDER — APIXABAN 5 MG PO TABS: 5.0000 mg | ORAL_TABLET | Freq: Two times a day (BID) | ORAL | 1 refills | Status: DC

## 2023-07-20 NOTE — Progress Notes (Signed)
 Rounding Note    Patient Name: Holly Baldwin Date of Encounter: 07/20/2023  [facility] Cardiologist: Armida Lander, MD   Subjective   No complaints.   Inpatient Medications    Scheduled Meds:  apixaban   5 mg Oral BID   benzonatate   100 mg Oral TID   budesonide  (PULMICORT ) nebulizer solution  0.5 mg Nebulization BID   feeding supplement  237 mL Oral BID BM   furosemide   40 mg Oral BID   hydrALAZINE   50 mg Oral BID   insulin  aspart  0-5 Units Subcutaneous QHS   insulin  aspart  0-9 Units Subcutaneous TID WC   insulin  glargine-yfgn  10 Units Subcutaneous QHS   isosorbide  mononitrate  30 mg Oral Daily   levalbuterol   0.63 mg Nebulization BID   levothyroxine   112 mcg Oral QAC breakfast   metoprolol  succinate  100 mg Oral Daily   polyethylene glycol  17 g Oral Daily   senna  2 tablet Oral Daily   simvastatin   40 mg Oral q1800   Continuous Infusions:  PRN Meds: acetaminophen  **OR** acetaminophen , guaiFENesin -dextromethorphan , prochlorperazine    Vital Signs    Vitals:   07/19/23 2251 07/20/23 0429 07/20/23 0741 07/20/23 0745  BP: 137/87 119/83    Pulse:  (!) 108    Resp:  16    Temp:  98.1 F (36.7 C)    TempSrc:  Oral    SpO2:  95% 97% 97%  Weight:  77.2 kg    Height:        Intake/Output Summary (Last 24 hours) at 07/20/2023 0821 Last data filed at 07/19/2023 1319 Gross per 24 hour  Intake 480 ml  Output --  Net 480 ml      07/20/2023    4:29 AM 07/19/2023    5:05 AM 07/18/2023    4:37 AM  Last 3 Weights  Weight (lbs) 170 lb 3.1 oz 164 lb 3.9 oz 168 lb 12.8 oz  Weight (kg) 77.2 kg 74.5 kg 76.567 kg      Telemetry    Afib 90s to 110s - Personally Reviewed  ECG    N/a - Personally Reviewed  Physical Exam   GEN: No acute distress.   Neck: No JVD Cardiac: irreg  Respiratory: Clear to auscultation bilaterally. GI: Soft, nontender, non-distended  MS: No edema; No deformity. Neuro:  Nonfocal  Psych: Normal affect   Labs    High  Sensitivity Troponin:   Recent Labs  Lab 07/17/23 0239 07/17/23 0428 07/17/23 0657 07/17/23 0929  TROPONINIHS 29* 42* 49* 57*     Chemistry Recent Labs  Lab 07/17/23 0239 07/17/23 0428 07/18/23 0403 07/19/23 0423 07/20/23 0518  NA 136  --  136 131* 132*  K 4.5  --  3.7 3.3* 4.0  CL 99  --  96* 97* 98  CO2 27  --  28 25 25   GLUCOSE 186*  --  134* 137* 132*  BUN 36*  --  42* 50* 54*  CREATININE 2.14*  --  1.99* 2.04* 2.02*  CALCIUM  9.1  --  9.0 8.4* 8.7*  MG  --    < > 2.2 2.0 1.9  PROT 6.8  --  6.7  --   --   ALBUMIN 3.6  --  3.4*  --   --   AST 14*  --  16  --   --   ALT 13  --  11  --   --   ALKPHOS 98  --  88  --   --   BILITOT 0.8  --  0.7  --   --   GFRNONAA 23*  --  25* 24* 25*  ANIONGAP 10  --  12 9 9    < > = values in this interval not displayed.    Lipids No results for input(s): "CHOL", "TRIG", "HDL", "LABVLDL", "LDLCALC", "CHOLHDL" in the last 168 hours.  Hematology Recent Labs  Lab 07/17/23 0239 07/18/23 0403  WBC 11.8* 9.7  RBC 4.14 4.34  HGB 10.0* 10.3*  HCT 31.6* 34.1*  MCV 76.3* 78.6*  MCH 24.2* 23.7*  MCHC 31.6 30.2  RDW 15.5 15.7*  PLT 272 304   Thyroid  No results for input(s): "TSH", "FREET4" in the last 168 hours.  BNP Recent Labs  Lab 07/17/23 0239  BNP 957.0*    DDimer No results for input(s): "DDIMER" in the last 168 hours.   Radiology    No results found.  Cardiac Studies    Patient Profile     Holly Baldwin is a 78 y.o. female with a hx of HFpEF, CKD IV, HTN, HLD, DM2 who is being seen 07/17/2023 for the evaluation of SOB at the request of Dr Tat.   Assessment & Plan    1.Acute HFrEF - prior history of HFpEF, new presenting of HFrEF this admission - 07/2023 echo LVEF 20-25%, indet diastolic function, normal RV, severe pulm HTN PASP 72, mod MR - BNP 957, CXR +pulmonary edema   -she received IV lasix  40mg  x 1 yesterday, has been scheduled to transition to oral lasix  40mg  bid today. I/Os are incomplete. Weights appear  inaccurate. Reds vest yesterday was 34%. Overall stable renal function.     - CKD IV with GFR 23 will limit medical therapy - home regimen currently includes farxiga  5mg , olmesartan  40mg , finerenone (an MRA) per neprhology, looks to be for her her neprhotic range proteinruria. She follows closely with Dr Carrolyn Clan and from prior notes appears to be at her baselnie renal function. Defer continued use to nephrology given her CKD. If nephrology ok remaining on then this regimen will have benefit for her HFrEF as well.  - she is on toprol  100mg  daily, hydralazine  50mg  bid, imdur  30mg  daily.  - poor cath candidate given renal dysfunction, would treat medically and repeat echo 3-6 months       2. Long QTc - the QTc adjusted for the bundle Dericka Ostenson block on manual calculation is around which is ok for her.  - keep K at 4, Mg 2. Monitor any prolonging QT medications   3.Afib - new diagnosis this admission - she is asymptomatic. Unclear duration, possibly could have contributed to her cardiomyopathy - currently on toprol  100mg  daily.  - CHADS2Vasc score is at least 5 (CHF, age x 2, DM2, gender), started eliquis  5mg  bid - rates mildly elevated at times, increase toprol  to 150mg  daily.  - if persistent afib at f/u could consider DCCV after 3 weeks of anticoagulation.    Would be ok for d/c from cardiac standpoint later today if heart rates ok, we will arrange outpatient f/u.     For questions or updates, please contact [facility] Please consult [email or web address] for contact info under        Signed, Armida Lander, MD  07/20/2023, 8:21 AM

## 2023-07-20 NOTE — Discharge Instructions (Signed)

## 2023-07-20 NOTE — Discharge Summary (Addendum)
 Physician Discharge Summary   Patient: Holly Baldwin MRN: [id number] DOB: 03/19/1945  Admit date:     07/17/2023  Discharge date: 07/20/23  Discharge Physician: Myrtie Atkinson Oluwatobi Visser   PCP: Minus Amel, MD   Recommendations at discharge:   Please follow up with primary care provider within 1-2 weeks  Please repeat BMP and CBC in one week     Hospital Course: 78 year old female with a history of hypertension, hyperlipidemia, diabetes mellitus type 2, HFpEF, hypothyroidism, CKD stage III, TIA, anxiety presenting with 2-week history of shortness of breath and orthopnea.  The patient has noted increasing lower extremity edema as well as increasing abdominal girth.  She states that she has been sleeping on a recliner for a few days prior to admission. In the ED, the patient was afebrile and hemodynamically stable.  Oxygen  saturation was 89% on room air.  She was placed on 2 L with saturation up to 92%.  WBC 11.8, hemoglobin 10.0, platelets 272.  Sodium 136, potassium 4.5, bicarbonate 24, serum creatinine 2.4.  LFTs were unremarkable.  Chest x-ray showed increased vascular congestion and interstitial markings.  BNP 957.  Troponin 29>> 42.  The patient was started on IV furosemide . Updated echocardiogram on 07/17/2023 showed EF 20-25%.  Cardiology was consulted to assist with management.  Assessment and Plan: Acute HFrEF - Continue IV furosemide  - Remains clinically fluid overloaded - Appreciate cardiology consult - 07/17/2023 echo EF 20-25%, mild TR, normal RVF, PASP 72 - Daily weights-- NEG 8 lbs--d/c weight 170 - Accurate I's and O's - Imdur  added to hydralazine  -ReDS = 34 -d/c home with furosemide  40 mg po bid   New onset atrial fibrillation - Started apixaban  -CHADSVASc2 =  5 - Echocardiogram as above - Initially on Lopressor  p.o. 25 mg every 6 hours with hold parameters>>transitioned to metoprolol  succinate 100 mg daily -d/c home with metoprolol  succinate 150 mg daily after speaking with Dr.  Amanda Jungling   Acute respiratory failure with hypoxia -stable on 2L>>weaned to RA -due to pulm edema -initially 89% on RA -wean oxygen  as tolerated   CKD stage IV - Baseline creatinine 1.8-2.2 -serum creatinine 2.02 at time d/c - d/c olmesartan    Cough -??due to ARB -start pantoprazole  -started pulmicort  and albuterol  -send home with albuterol  inhaler   Essential hypertension - Imdur  added to hydralazine  - Continue olmesartan  for now and monitor renal function closely   Diabetes mellitus type 2 with hyperglycemia - 05/01/2023 hemoglobin A1c 7.0 - Reduced dose Semglee  - NovoLog  sliding scale   Obesity - BMI 30.65 - Lifestyle modification   Hypothyroidism - Continue Synthroid    Mixed hyperlipidemia - Continue statin   Hypokalemia -repleted -mag 2.0        Consultants: cardiology Procedures performed: none  Disposition: Home Diet recommendation:  Cardiac diet DISCHARGE MEDICATION: Allergies as of 07/20/2023       Reactions   Bee Venom Anaphylaxis   Penicillins Hives        Medication List     STOP taking these medications    carvedilol  25 MG tablet Commonly known as: COREG    clopidogrel  75 MG tablet Commonly known as: PLAVIX    glipiZIDE  5 MG tablet Commonly known as: GLUCOTROL    Kerendia 10 MG Tabs Generic drug: Finerenone   NIFEdipine  60 MG 24 hr tablet Commonly known as: PROCARDIA  XL/NIFEDICAL XL   olmesartan  20 MG tablet Commonly known as: BENICAR    Restasis 0.05 % ophthalmic emulsion Generic drug: cycloSPORINE       TAKE these medications  albuterol  108 (90 Base) MCG/ACT inhaler Commonly known as: VENTOLIN  HFA Inhale 2 puffs into the lungs every 6 (six) hours as needed for wheezing or shortness of breath. What changed: how much to take   apixaban  5 MG Tabs tablet Commonly known as: ELIQUIS  Take 1 tablet (5 mg total) by mouth 2 (two) times daily.   dapagliflozin  propanediol 5 MG Tabs tablet Commonly known as:  Farxiga  Take 1 tablet (5 mg total) by mouth daily.   escitalopram 10 MG tablet Commonly known as: LEXAPRO Take 10 mg by mouth daily.   feeding supplement Liqd Take 237 mLs by mouth 2 (two) times daily between meals.   furosemide  40 MG tablet Commonly known as: LASIX  Take 1 tablet (40 mg total) by mouth 2 (two) times daily.   hydrALAZINE  50 MG tablet Commonly known as: APRESOLINE  Take 1 tablet (50 mg total) by mouth 2 (two) times daily.   isosorbide  mononitrate 30 MG 24 hr tablet Commonly known as: IMDUR  Take 1 tablet (30 mg total) by mouth daily. Start taking on: Jul 21, 2023 What changed:  medication strength how much to take   Lantus  SoloStar 100 UNIT/ML Solostar Pen Generic drug: insulin  glargine Inject 15 Units into the skin at bedtime. What changed: how much to take   LEG CRAMPS PM SL Take 1 tablet by mouth daily as needed (Cramps).   levothyroxine  100 MCG tablet Commonly known as: SYNTHROID  Take 100 mcg by mouth daily before breakfast.   metoprolol  succinate 50 MG 24 hr tablet Commonly known as: TOPROL -XL Take 3 tablets (150 mg total) by mouth daily. Take with or immediately following a meal. Start taking on: Jul 21, 2023   pantoprazole  40 MG tablet Commonly known as: PROTONIX  Take 1 tablet (40 mg total) by mouth daily.   PreserVision AREDS 2 Caps Take by mouth 2 (two) times daily.   simvastatin  40 MG tablet Commonly known as: ZOCOR  TAKE 1 TABLET EVERY DAY   ziprasidone  40 MG capsule Commonly known as: GEODON  Take 40 mg by mouth at bedtime.        Follow-up Information     Dorma Gash, PA-C Follow up on 08/14/2023.   Specialties: Cardiology, Cardiology Why: Follow up with Woodfin Hays, PA-C on 08/14/2023 at 2:30 pm Contact information: [address] [place] [address] [phone]                Discharge Exam: Filed Weights   07/18/23 0437 07/19/23 0505 07/20/23 0429  Weight: 76.6 kg 74.5 kg 77.2 kg   HEENT:   [place]/AT, No thrush, no icterus CV:  RRR, no rub, no S3, no S4 Lung:  fine bibasilar crackles.  No wheeze Abd:  soft/+BS, NT Ext:  No edema, no lymphangitis, no synovitis, no rash   Condition at discharge: stable  The results of significant diagnostics from this hospitalization (including imaging, microbiology, ancillary and laboratory) are listed below for reference.   Imaging Studies: ECHOCARDIOGRAM COMPLETE Result Date: 07/17/2023    ECHOCARDIOGRAM REPORT   Patient Name:   Holly Baldwin Garbutt Date of Exam: 07/17/2023 Medical Rec #:  [id number]    Height:       64.0 in Accession #:    [id number]   Weight:       178.6 lb Date of Birth:  [birth year]    BSA:          1.864 m Patient Age:    78 years     BP:  135/95 mmHg Patient Gender: F            HR:           76 bpm. Exam Location:  [facility] Procedure: 2D Echo, Cardiac Doppler, Color Doppler and Intracardiac            Opacification Agent (Both Spectral and Color Flow Doppler were            utilized during procedure). Indications:    I50.40* Unspecified combined systolic (congestive) and diastolic                 (congestive) heart failure  History:        Patient has prior history of Echocardiogram examinations. CHF,                 TIA, Signs/Symptoms:Shortness of Breath and Dyspnea; Risk                 Factors:Hypertension, Dyslipidemia and Diabetes.  Sonographer:    Raynelle Callow RDCS Referring Phys: [id number] OLADAPO ADEFESO  Sonographer Comments: [id number]. IMPRESSIONS  1. Left ventricular ejection fraction, by estimation, is 20 to 25%. The left ventricle has severely decreased function. The left ventricle demonstrates global hypokinesis. There is moderate left ventricular hypertrophy. Left ventricular diastolic parameters are indeterminate.  2. Right ventricular systolic function is normal. The right ventricular size is normal. There is severely elevated pulmonary artery systolic pressure. The estimated right ventricular systolic pressure is 72.2 mmHg.   3. Left atrial size was mildly dilated.  4. The mitral valve is abnormal. Moderate mitral valve regurgitation. No evidence of mitral stenosis.  5. The tricuspid valve is abnormal.  6. The aortic valve is tricuspid. Aortic valve regurgitation is not visualized. No aortic stenosis is present.  7. The inferior vena cava is dilated in size with <50% respiratory variability, suggesting right atrial pressure of 15 mmHg. FINDINGS  Left Ventricle: Question of possible prominent apical trabeculations of standard 2D imaging, however with contrast does not appear to be significant. Left ventricular ejection fraction, by estimation, is 20 to 25%. The left ventricle has severely decreased function. The left ventricle demonstrates global hypokinesis. Definity  contrast agent was given IV to delineate the left ventricular endocardial borders. The left ventricular internal cavity size was normal in size. There is moderate left ventricular hypertrophy. Left ventricular diastolic parameters are indeterminate. Right Ventricle: The right ventricular size is normal. Right vetricular wall thickness was not well visualized. Right ventricular systolic function is normal. There is severely elevated pulmonary artery systolic pressure. The tricuspid regurgitant velocity is 3.78 m/s, and with an assumed right atrial pressure of 15 mmHg, the estimated right ventricular systolic pressure is 72.2 mmHg. Left Atrium: Left atrial size was mildly dilated. Right Atrium: Right atrial size was normal in size. Pericardium: There is no evidence of pericardial effusion. Mitral Valve: The mitral valve is abnormal. Moderate mitral valve regurgitation. No evidence of mitral valve stenosis. MV peak gradient, 8.6 mmHg. The mean mitral valve gradient is 3.0 mmHg. Tricuspid Valve: The tricuspid valve is abnormal. Tricuspid valve regurgitation is mild . No evidence of tricuspid stenosis. Aortic Valve: The aortic valve is tricuspid. Aortic valve regurgitation is not  visualized. No aortic stenosis is present. Aortic valve mean gradient measures 4.0 mmHg. Aortic valve peak gradient measures 8.7 mmHg. Aortic valve area, by VTI measures 2.86 cm. Pulmonic Valve: The pulmonic valve was not well visualized. Pulmonic valve regurgitation is not visualized. No evidence of pulmonic stenosis. Aorta: The aortic root and  ascending aorta are structurally normal, with no evidence of dilitation. Venous: The inferior vena cava is dilated in size with less than 50% respiratory variability, suggesting right atrial pressure of 15 mmHg. IAS/Shunts: The interatrial septum was not well visualized.  LEFT VENTRICLE PLAX 2D LVIDd:         4.87 cm      Diastology LVIDs:         4.63 cm      LV e' medial:    5.42 cm/s LV PW:         1.30 cm      LV E/e' medial:  22.5 LV IVS:        1.30 cm      LV e' lateral:   10.40 cm/s LVOT diam:     2.30 cm      LV E/e' lateral: 11.7 LV SV:         67 LV SV Index:   36 LVOT Area:     4.15 cm  LV Volumes (MOD) LV vol d, MOD A2C: 228.0 ml LV vol d, MOD A4C: 227.0 ml LV vol s, MOD A2C: 217.0 ml LV vol s, MOD A4C: 199.0 ml LV SV MOD A2C:     11.0 ml LV SV MOD A4C:     227.0 ml LV SV MOD BP:      16.4 ml RIGHT VENTRICLE             IVC RV S prime:     14.60 cm/s  IVC diam: 2.52 cm TAPSE (M-mode): 1.8 cm LEFT ATRIUM             Index        RIGHT ATRIUM           Index LA diam:        2.90 cm 1.56 cm/m   RA Area:     16.80 cm LA Vol (A2C):   55.5 ml 29.77 ml/m  RA Volume:   53.70 ml  28.80 ml/m LA Vol (A4C):   71.6 ml 38.40 ml/m LA Biplane Vol: 65.7 ml 35.24 ml/m  AORTIC VALVE AV Area (Vmax):    3.30 cm AV Area (Vmean):   2.98 cm AV Area (VTI):     2.86 cm AV Vmax:           147.39 cm/s AV Vmean:          93.612 cm/s AV VTI:            0.235 m AV Peak Grad:      8.7 mmHg AV Mean Grad:      4.0 mmHg LVOT Vmax:         117.00 cm/s LVOT Vmean:        67.100 cm/s LVOT VTI:          0.162 m LVOT/AV VTI ratio: 0.69  AORTA Ao Root diam: 3.30 cm Ao Asc diam:  2.70 cm  MITRAL VALVE                  TRICUSPID VALVE MV Area (PHT): 5.66 cm       TR Peak grad:   57.2 mmHg MV Area VTI:   2.72 cm       TR Vmax:        378.00 cm/s MV Peak grad:  8.6 mmHg MV Mean grad:  3.0 mmHg       SHUNTS MV Vmax:       1.47 m/s  Systemic VTI:  0.16 m MV Vmean:      77.5 cm/s      Systemic Diam: 2.30 cm MV Decel Time: 134 msec MR Peak grad:    94.9 mmHg MR Mean grad:    51.0 mmHg MR Vmax:         487.00 cm/s MR Vmean:        328.0 cm/s MR PISA:         1.01 cm MR PISA Eff ROA: 8 mm MR PISA Radius:  0.40 cm MV E velocity: 122.00 cm/s MV A velocity: 88.10 cm/s MV E/A ratio:  1.38 Armida Lander MD Electronically signed by Armida Lander MD Signature Date/Time: 07/17/2023/12:16:21 PM    Final    DG Chest Portable 1 View Result Date: 07/17/2023 CLINICAL DATA:  Dyspnea fibrosis. EXAM: PORTABLE CHEST 1 VIEW COMPARISON:  Portable chest 11/01/2021 FINDINGS: The heart has moderately enlarged since the prior study. There is increased vascular engorgement. There is mild generalized interstitial consolidation consistent with edema, with a basal gradient and small pleural effusions. There is patchy haziness in the lung bases which could be hazy atelectasis, ground-glass edema or pneumonitis. No other focal infiltrate is seen. The mediastinum is stable with aortic tortuosity and atherosclerosis. Thoracic spondylosis with no new osseous finding. IMPRESSION: 1. Interval development of moderate cardiomegaly with increased vascular engorgement, with mild generalized interstitial consolidation consistent with edema. 2. Small pleural effusions. 3. Patchy haziness in the lung bases which could be hazy atelectasis, ground-glass edema or pneumonitis. 4. Clinical correlation and radiographic follow-up recommended. Electronically Signed   By: Denman Fischer M.D.   On: 07/17/2023 03:21    Microbiology: Results for orders placed or performed during the hospital encounter of 02/08/21  Resp Panel by RT-PCR (Flu  A&B, Covid) Nasopharyngeal Swab     Status: Abnormal   Collection Time: 02/08/21  4:59 PM   Specimen: Nasopharyngeal Swab; Nasopharyngeal(NP) swabs in vial transport medium  Result Value Ref Range Status   SARS Coronavirus 2 by RT PCR POSITIVE (A) NEGATIVE Final    Comment: RESULT CALLED TO, READ BACK BY AND VERIFIED WITH: ROBERTS,M AT 1818 ON 12.9.22 BY RUCINSKI,B (NOTE) SARS-CoV-2 target nucleic acids are DETECTED.  The SARS-CoV-2 RNA is generally detectable in upper respiratory specimens during the acute phase of infection. Positive results are indicative of the presence of the identified virus, but do not rule out bacterial infection or co-infection with other pathogens not detected by the test. Clinical correlation with patient history and other diagnostic information is necessary to determine patient infection status. The expected result is Negative.  Fact Sheet for Patients: BloggerCourse.com  Fact Sheet for Healthcare Providers: SeriousBroker.it  This test is not yet approved or cleared by the [place]  FDA and  has been authorized for detection and/or diagnosis of SARS-CoV-2 by FDA under an Emergency Use Authorization (EUA).  This EUA will remain in effect (meaning this te st can be used) for the duration of  the COVID-19 declaration under Section 564(b)(1) of the Act, 21 U.S.C. section 360bbb-3(b)(1), unless the authorization is terminated or revoked sooner.     Influenza A by PCR NEGATIVE NEGATIVE Final   Influenza B by PCR NEGATIVE NEGATIVE Final    Comment: (NOTE) The Xpert Xpress SARS-CoV-2/FLU/RSV plus assay is intended as an aid in the diagnosis of influenza from Nasopharyngeal swab specimens and should not be used as a sole basis for treatment. Nasal washings and aspirates are unacceptable for Xpert Xpress SARS-CoV-2/FLU/RSV testing.  Fact Sheet for  Patients: BloggerCourse.com  Fact Sheet for Healthcare Providers: SeriousBroker.it  This test is not yet approved or cleared by the [place]  FDA and has been authorized for detection and/or diagnosis of SARS-CoV-2 by FDA under an Emergency Use Authorization (EUA). This EUA will remain in effect (meaning this test can be used) for the duration of the COVID-19 declaration under Section 564(b)(1) of the Act, 21 U.S.C. section 360bbb-3(b)(1), unless the authorization is terminated or revoked.  Performed at [facility], [address], [place], [place] [address]     Labs: CBC: Recent Labs  Lab 07/17/23 0239 07/18/23 0403  WBC 11.8* 9.7  NEUTROABS 10.0*  --   HGB 10.0* 10.3*  HCT 31.6* 34.1*  MCV 76.3* 78.6*  PLT 272 304   Basic Metabolic Panel: Recent Labs  Lab 07/17/23 0239 07/17/23 0428 07/18/23 0403 07/19/23 0423 07/20/23 0518  NA 136  --  136 131* 132*  K 4.5  --  3.7 3.3* 4.0  CL 99  --  96* 97* 98  CO2 27  --  28 25 25   GLUCOSE 186*  --  134* 137* 132*  BUN 36*  --  42* 50* 54*  CREATININE 2.14*  --  1.99* 2.04* 2.02*  CALCIUM  9.1  --  9.0 8.4* 8.7*  MG  --  2.5* 2.2 2.0 1.9  PHOS  --   --  4.1  --   --    Liver Function Tests: Recent Labs  Lab 07/17/23 0239 07/18/23 0403  AST 14* 16  ALT 13 11  ALKPHOS 98 88  BILITOT 0.8 0.7  PROT 6.8 6.7  ALBUMIN 3.6 3.4*   CBG: Recent Labs  Lab 07/19/23 0733 07/19/23 1117 07/19/23 1650 07/20/23 0057 07/20/23 0839  GLUCAP 142* 171* 139* 142* 207*    Discharge time spent: greater than 30 minutes.  Signed: Demaris Fillers, MD [facility] 07/20/2023

## 2023-07-20 NOTE — Progress Notes (Signed)
   07/20/23 0800  ReDS Vest / Clip  Station Marker B  Ruler Value 36  ReDS Value Range < 36  ReDS Actual Value 33

## 2023-07-20 NOTE — Progress Notes (Signed)
 Patient discharged with instructions given on medications and follow up visits,verbalized understanding. Prescriptions sent to Pharmacy of choice documented on AVS. IV discontinued catheter intact. Accompanied by staff to an awaiting vehicle.

## 2023-07-20 NOTE — Plan of Care (Signed)
  Problem: Health Behavior/Discharge Planning: Goal: Ability to manage health-related needs will improve Outcome: Progressing   Problem: Clinical Measurements: Goal: Ability to maintain clinical measurements within normal limits will improve Outcome: Progressing Goal: Will remain free from infection Outcome: Progressing Goal: Diagnostic test results will improve Outcome: Progressing   Problem: Activity: Goal: Risk for activity intolerance will decrease Outcome: Progressing   Problem: Elimination: Goal: Will not experience complications related to bowel motility Outcome: Progressing Goal: Will not experience complications related to urinary retention Outcome: Progressing   Problem: Pain Managment: Goal: General experience of comfort will improve and/or be controlled Outcome: Progressing   Problem: Safety: Goal: Ability to remain free from injury will improve Outcome: Progressing   Problem: Coping: Goal: Ability to adjust to condition or change in health will improve Outcome: Progressing   Problem: Fluid Volume: Goal: Ability to maintain a balanced intake and output will improve Outcome: Progressing   Problem: Health Behavior/Discharge Planning: Goal: Ability to manage health-related needs will improve Outcome: Progressing   Problem: Metabolic: Goal: Ability to maintain appropriate glucose levels will improve Outcome: Progressing   Problem: Tissue Perfusion: Goal: Adequacy of tissue perfusion will improve Outcome: Progressing

## 2023-07-20 NOTE — Telephone Encounter (Signed)

## 2023-07-20 NOTE — TOC Transition Note (Signed)
 Transition of Care [facility]) - Discharge Note   Patient Details  Name: Holly Baldwin MRN: [id number] Date of Birth: 07-24-1945  Transition [facility] CM/SW Contact:  Orelia Binet, RN Phone Number: 07/20/2023, 11:43 AM   Clinical Narrative:   Patient discharging home, needing HHRN for medication management. CMS choices reviewed, Patient lives in [place] with [facility] Karst accepted the referral, MD placing orders.    Final next level of care: Home w Home Health Services Barriers to Discharge: Barriers Resolved  Patient Goals and CMS Choice Patient states their goals for this hospitalization and ongoing recovery are:: return home   Discharge Placement    Patient and family notified of of transfer: 07/20/23  Discharge Plan and Services Additional resources added to the After Visit Summary for         [facility] Arranged: RN [facility] Agency: [facility] Date [facility] Agency Contacted: 07/20/23 Time HH Agency Contacted: 1142 Representative spoke with at [facility] Agency: Randel Buss  Social Drivers of Health (SDOH) Interventions SDOH Screenings   Food Insecurity: No Food Insecurity (07/17/2023)  Housing: Low Risk  (07/17/2023)  Transportation Needs: No Transportation Needs (07/17/2023)  Utilities: Not At Risk (07/17/2023)  Alcohol Screen: Low Risk  (05/07/2020)  Depression (PHQ2-9): Low Risk  (05/07/2020)  Financial Resource Strain: Low Risk  (01/20/2023)  Physical Activity: Inactive (12/17/2022)  Social Connections: Moderately Integrated (07/17/2023)  Stress: No Stress Concern Present (05/07/2020)  Tobacco Use: Low Risk  (07/17/2023)     Readmission Risk Interventions    07/20/2023   11:42 AM  Readmission Risk Prevention Plan  Transportation Screening Complete  PCP or Specialist Appt within 3-5 Days Complete  HRI or Home Care Consult Complete  Social Work Consult for Recovery Care Planning/Counseling Complete  Palliative Care Screening Not Applicable  Medication Review Oceanographer) Complete

## 2023-07-20 NOTE — Care Management Important Message (Signed)
 Important Message  Patient Details  Name: Holly Baldwin MRN: [id number] Date of Birth: 1945/07/26   Important Message Given:  Yes - Medicare IM     Jenkins Risdon L Zaide Kardell 07/20/2023, 11:10 AM

## 2023-07-20 NOTE — Evaluation (Signed)
 Physical Therapy Evaluation Patient Details Name: Holly Baldwin MRN: [id number] DOB: 07/14/1945 Today's Date: 07/20/2023  History of Present Illness  Holly Baldwin is a 78 y.o. female with medical history significant of hypertension, hyperlipidemia, type 2 diabetes mellitus, CKD 4, obesity who presents to the emergency department due to 2-week onset of worsening shortness of breath which occurs at rest and worsens with exertion.  She complained of increased abdominal girth and difficulty in being able to lay flat in bed, patient states that she has been sleeping on a recliner since the last 5 days, shortness of breath was worse yesterday and she decided to go to the ED for further evaluation and management.   Clinical Impression  Patient functioning at baseline for functional mobility and demonstrating good return for ambulatin in room and hallway without loss of balance or need for an AD. Plan:  Patient discharged from physical therapy to care of nursing for ambulation daily as tolerated for length of stay.       If plan is discharge home, recommend the following: Help with stairs or ramp for entrance   Can travel by private vehicle        Equipment Recommendations None recommended by PT  Recommendations for Other Services       Functional Status Assessment Patient has not had a recent decline in their functional status     Precautions / Restrictions Precautions Precautions: None Restrictions Weight Bearing Restrictions Per Provider Order: No      Mobility  Bed Mobility Overal bed mobility: Modified Independent                  Transfers Overall transfer level: Modified independent                      Ambulation/Gait Ambulation/Gait assistance: Modified independent (Device/Increase time) Gait Distance (Feet): 80 Feet Assistive device: None Gait Pattern/deviations: WFL(Within Functional Limits) Gait velocity: decreased     General Gait Details: grossly  WFL wtih good return for ambulatin in room and hallway without loss of balance or need for an AD  Stairs            Wheelchair Mobility     Tilt Bed    Modified Rankin (Stroke Patients Only)       Balance Overall balance assessment: No apparent balance deficits (not formally assessed)                                           Pertinent Vitals/Pain Pain Assessment Pain Assessment: No/denies pain    Home Living Family/patient expects to be discharged to:: Private residence Living Arrangements: Spouse/significant other   Type of Home: House Home Access: Level entry       Home Layout: One level Home Equipment: Cane - single point;Shower seat      Prior Function Prior Level of Function : Independent/Modified Independent             Mobility Comments: Household and short distanced community ambulation without AD, uses SPC PRN ADLs Comments: Independent for household ADLs, assisted by family for community     Extremity/Trunk Assessment   Upper Extremity Assessment Upper Extremity Assessment: Overall WFL for tasks assessed    Lower Extremity Assessment Lower Extremity Assessment: Overall WFL for tasks assessed    Cervical / Trunk Assessment Cervical / Trunk Assessment: Normal  Communication  Communication Communication: No apparent difficulties    Cognition Arousal: Alert Behavior During Therapy: WFL for tasks assessed/performed   PT - Cognitive impairments: No apparent impairments                         Following commands: Intact       Cueing Cueing Techniques: Verbal cues     General Comments      Exercises     Assessment/Plan    PT Assessment Patient does not need any further PT services  PT Problem List         PT Treatment Interventions      PT Goals (Current goals can be found in the Care Plan section)  Acute Rehab PT Goals Patient Stated Goal: return home with family to assist PT Goal  Formulation: With patient Time For Goal Achievement: 07/20/23 Potential to Achieve Goals: Good    Frequency       Co-evaluation               AM-PAC PT "6 Clicks" Mobility  Outcome Measure Help needed turning from your back to your side while in a flat bed without using bedrails?: None Help needed moving from lying on your back to sitting on the side of a flat bed without using bedrails?: None Help needed moving to and from a bed to a chair (including a wheelchair)?: None Help needed standing up from a chair using your arms (e.g., wheelchair or bedside chair)?: None Help needed to walk in hospital room?: None Help needed climbing 3-5 steps with a railing? : A Little 6 Click Score: 23    End of Session   Activity Tolerance: Patient tolerated treatment well Patient left: in bed Nurse Communication: Mobility status PT Visit Diagnosis: Unsteadiness on feet (R26.81);Other abnormalities of gait and mobility (R26.89);Muscle weakness (generalized) (M62.81)    Time: 1610-9604 PT Time Calculation (min) (ACUTE ONLY): 20 min   Charges:   PT Evaluation $PT Eval Moderate Complexity: 1 Mod PT Treatments $Therapeutic Activity: 8-22 mins PT General Charges $$ ACUTE PT VISIT: 1 Visit         12:09 PM, 07/20/23 Walton Guppy, MPT Physical Therapist with [facility] [phone] office [phone] mobile phone

## 2023-07-24 DIAGNOSIS — I5032 Chronic diastolic (congestive) heart failure: Secondary | ICD-10-CM | POA: Diagnosis not present

## 2023-07-24 DIAGNOSIS — I480 Paroxysmal atrial fibrillation: Secondary | ICD-10-CM | POA: Diagnosis not present

## 2023-07-24 DIAGNOSIS — N184 Chronic kidney disease, stage 4 (severe): Secondary | ICD-10-CM | POA: Diagnosis not present

## 2023-07-24 DIAGNOSIS — N183 Chronic kidney disease, stage 3 unspecified: Secondary | ICD-10-CM | POA: Diagnosis not present

## 2023-07-26 DIAGNOSIS — Z9181 History of falling: Secondary | ICD-10-CM | POA: Diagnosis not present

## 2023-07-26 DIAGNOSIS — K219 Gastro-esophageal reflux disease without esophagitis: Secondary | ICD-10-CM | POA: Diagnosis not present

## 2023-07-26 DIAGNOSIS — E782 Mixed hyperlipidemia: Secondary | ICD-10-CM | POA: Diagnosis not present

## 2023-07-26 DIAGNOSIS — K449 Diaphragmatic hernia without obstruction or gangrene: Secondary | ICD-10-CM | POA: Diagnosis not present

## 2023-07-26 DIAGNOSIS — N184 Chronic kidney disease, stage 4 (severe): Secondary | ICD-10-CM | POA: Diagnosis not present

## 2023-07-26 DIAGNOSIS — D631 Anemia in chronic kidney disease: Secondary | ICD-10-CM | POA: Diagnosis not present

## 2023-07-26 DIAGNOSIS — Z7901 Long term (current) use of anticoagulants: Secondary | ICD-10-CM | POA: Diagnosis not present

## 2023-07-26 DIAGNOSIS — I5021 Acute systolic (congestive) heart failure: Secondary | ICD-10-CM | POA: Diagnosis not present

## 2023-07-26 DIAGNOSIS — E876 Hypokalemia: Secondary | ICD-10-CM | POA: Diagnosis not present

## 2023-07-26 DIAGNOSIS — I081 Rheumatic disorders of both mitral and tricuspid valves: Secondary | ICD-10-CM | POA: Diagnosis not present

## 2023-07-26 DIAGNOSIS — E1122 Type 2 diabetes mellitus with diabetic chronic kidney disease: Secondary | ICD-10-CM | POA: Diagnosis not present

## 2023-07-26 DIAGNOSIS — Z7984 Long term (current) use of oral hypoglycemic drugs: Secondary | ICD-10-CM | POA: Diagnosis not present

## 2023-07-26 DIAGNOSIS — D509 Iron deficiency anemia, unspecified: Secondary | ICD-10-CM | POA: Diagnosis not present

## 2023-07-26 DIAGNOSIS — Z8673 Personal history of transient ischemic attack (TIA), and cerebral infarction without residual deficits: Secondary | ICD-10-CM | POA: Diagnosis not present

## 2023-07-26 DIAGNOSIS — I130 Hypertensive heart and chronic kidney disease with heart failure and stage 1 through stage 4 chronic kidney disease, or unspecified chronic kidney disease: Secondary | ICD-10-CM | POA: Diagnosis not present

## 2023-07-26 DIAGNOSIS — I5033 Acute on chronic diastolic (congestive) heart failure: Secondary | ICD-10-CM | POA: Diagnosis not present

## 2023-07-26 DIAGNOSIS — E039 Hypothyroidism, unspecified: Secondary | ICD-10-CM | POA: Diagnosis not present

## 2023-07-26 DIAGNOSIS — I4891 Unspecified atrial fibrillation: Secondary | ICD-10-CM | POA: Diagnosis not present

## 2023-07-26 DIAGNOSIS — J9601 Acute respiratory failure with hypoxia: Secondary | ICD-10-CM | POA: Diagnosis not present

## 2023-07-26 DIAGNOSIS — Z794 Long term (current) use of insulin: Secondary | ICD-10-CM | POA: Diagnosis not present

## 2023-07-29 DIAGNOSIS — N184 Chronic kidney disease, stage 4 (severe): Secondary | ICD-10-CM | POA: Diagnosis not present

## 2023-07-29 DIAGNOSIS — E039 Hypothyroidism, unspecified: Secondary | ICD-10-CM | POA: Diagnosis not present

## 2023-07-29 DIAGNOSIS — Z794 Long term (current) use of insulin: Secondary | ICD-10-CM | POA: Diagnosis not present

## 2023-07-29 DIAGNOSIS — E1122 Type 2 diabetes mellitus with diabetic chronic kidney disease: Secondary | ICD-10-CM | POA: Diagnosis not present

## 2023-07-30 DIAGNOSIS — I4891 Unspecified atrial fibrillation: Secondary | ICD-10-CM | POA: Diagnosis not present

## 2023-07-30 DIAGNOSIS — Z7984 Long term (current) use of oral hypoglycemic drugs: Secondary | ICD-10-CM | POA: Diagnosis not present

## 2023-07-30 DIAGNOSIS — I081 Rheumatic disorders of both mitral and tricuspid valves: Secondary | ICD-10-CM | POA: Diagnosis not present

## 2023-07-30 DIAGNOSIS — D509 Iron deficiency anemia, unspecified: Secondary | ICD-10-CM | POA: Diagnosis not present

## 2023-07-30 DIAGNOSIS — D631 Anemia in chronic kidney disease: Secondary | ICD-10-CM | POA: Diagnosis not present

## 2023-07-30 DIAGNOSIS — Z8673 Personal history of transient ischemic attack (TIA), and cerebral infarction without residual deficits: Secondary | ICD-10-CM | POA: Diagnosis not present

## 2023-07-30 DIAGNOSIS — K449 Diaphragmatic hernia without obstruction or gangrene: Secondary | ICD-10-CM | POA: Diagnosis not present

## 2023-07-30 DIAGNOSIS — I130 Hypertensive heart and chronic kidney disease with heart failure and stage 1 through stage 4 chronic kidney disease, or unspecified chronic kidney disease: Secondary | ICD-10-CM | POA: Diagnosis not present

## 2023-07-30 DIAGNOSIS — Z794 Long term (current) use of insulin: Secondary | ICD-10-CM | POA: Diagnosis not present

## 2023-07-30 DIAGNOSIS — Z7901 Long term (current) use of anticoagulants: Secondary | ICD-10-CM | POA: Diagnosis not present

## 2023-07-30 DIAGNOSIS — E039 Hypothyroidism, unspecified: Secondary | ICD-10-CM | POA: Diagnosis not present

## 2023-07-30 DIAGNOSIS — E876 Hypokalemia: Secondary | ICD-10-CM | POA: Diagnosis not present

## 2023-07-30 DIAGNOSIS — N184 Chronic kidney disease, stage 4 (severe): Secondary | ICD-10-CM | POA: Diagnosis not present

## 2023-07-30 DIAGNOSIS — E1122 Type 2 diabetes mellitus with diabetic chronic kidney disease: Secondary | ICD-10-CM | POA: Diagnosis not present

## 2023-07-30 DIAGNOSIS — I5033 Acute on chronic diastolic (congestive) heart failure: Secondary | ICD-10-CM | POA: Diagnosis not present

## 2023-07-30 DIAGNOSIS — I5021 Acute systolic (congestive) heart failure: Secondary | ICD-10-CM | POA: Diagnosis not present

## 2023-07-30 DIAGNOSIS — J9601 Acute respiratory failure with hypoxia: Secondary | ICD-10-CM | POA: Diagnosis not present

## 2023-07-30 DIAGNOSIS — Z9181 History of falling: Secondary | ICD-10-CM | POA: Diagnosis not present

## 2023-07-30 DIAGNOSIS — K219 Gastro-esophageal reflux disease without esophagitis: Secondary | ICD-10-CM | POA: Diagnosis not present

## 2023-07-30 DIAGNOSIS — E782 Mixed hyperlipidemia: Secondary | ICD-10-CM | POA: Diagnosis not present

## 2023-07-30 LAB — COMPREHENSIVE METABOLIC PANEL WITH GFR
ALT: 19 IU/L (ref 0–32)
AST: 19 IU/L (ref 0–40)
Albumin: 3.8 g/dL (ref 3.8–4.8)
Alkaline Phosphatase: 129 IU/L — ABNORMAL HIGH (ref 44–121)
BUN/Creatinine Ratio: 16 (ref 12–28)
BUN: 35 mg/dL — ABNORMAL HIGH (ref 8–27)
Bilirubin Total: <0.2 mg/dL (ref 0.0–1.2)
CO2: 24 mmol/L (ref 20–29)
Calcium: 9.3 mg/dL (ref 8.7–10.3)
Chloride: 97 mmol/L (ref 96–106)
Creatinine, Ser: 2.18 mg/dL — ABNORMAL HIGH (ref 0.57–1.00)
Globulin, Total: 2.8 g/dL (ref 1.5–4.5)
Glucose: 153 mg/dL — ABNORMAL HIGH (ref 70–99)
Potassium: 5.2 mmol/L (ref 3.5–5.2)
Sodium: 139 mmol/L (ref 134–144)
Total Protein: 6.6 g/dL (ref 6.0–8.5)
eGFR: 22 mL/min/{1.73_m2} — ABNORMAL LOW (ref >59)

## 2023-07-30 LAB — T4, FREE: Free T4: 1.28 ng/dL (ref 0.82–1.77)

## 2023-07-30 LAB — TSH: TSH: 0.031 u[IU]/mL — ABNORMAL LOW (ref 0.450–4.500)

## 2023-07-31 ENCOUNTER — Other Ambulatory Visit: Payer: Self-pay | Admitting: Nurse Practitioner

## 2023-08-06 DIAGNOSIS — Z794 Long term (current) use of insulin: Secondary | ICD-10-CM | POA: Diagnosis not present

## 2023-08-06 DIAGNOSIS — I5033 Acute on chronic diastolic (congestive) heart failure: Secondary | ICD-10-CM | POA: Diagnosis not present

## 2023-08-06 DIAGNOSIS — E1122 Type 2 diabetes mellitus with diabetic chronic kidney disease: Secondary | ICD-10-CM | POA: Diagnosis not present

## 2023-08-06 DIAGNOSIS — I130 Hypertensive heart and chronic kidney disease with heart failure and stage 1 through stage 4 chronic kidney disease, or unspecified chronic kidney disease: Secondary | ICD-10-CM | POA: Diagnosis not present

## 2023-08-06 DIAGNOSIS — Z7984 Long term (current) use of oral hypoglycemic drugs: Secondary | ICD-10-CM | POA: Diagnosis not present

## 2023-08-06 DIAGNOSIS — J9601 Acute respiratory failure with hypoxia: Secondary | ICD-10-CM | POA: Diagnosis not present

## 2023-08-06 DIAGNOSIS — K449 Diaphragmatic hernia without obstruction or gangrene: Secondary | ICD-10-CM | POA: Diagnosis not present

## 2023-08-06 DIAGNOSIS — I4891 Unspecified atrial fibrillation: Secondary | ICD-10-CM | POA: Diagnosis not present

## 2023-08-06 DIAGNOSIS — I081 Rheumatic disorders of both mitral and tricuspid valves: Secondary | ICD-10-CM | POA: Diagnosis not present

## 2023-08-06 DIAGNOSIS — N184 Chronic kidney disease, stage 4 (severe): Secondary | ICD-10-CM | POA: Diagnosis not present

## 2023-08-06 DIAGNOSIS — Z7901 Long term (current) use of anticoagulants: Secondary | ICD-10-CM | POA: Diagnosis not present

## 2023-08-06 DIAGNOSIS — K219 Gastro-esophageal reflux disease without esophagitis: Secondary | ICD-10-CM | POA: Diagnosis not present

## 2023-08-06 DIAGNOSIS — D509 Iron deficiency anemia, unspecified: Secondary | ICD-10-CM | POA: Diagnosis not present

## 2023-08-06 DIAGNOSIS — E782 Mixed hyperlipidemia: Secondary | ICD-10-CM | POA: Diagnosis not present

## 2023-08-06 DIAGNOSIS — E876 Hypokalemia: Secondary | ICD-10-CM | POA: Diagnosis not present

## 2023-08-06 DIAGNOSIS — I5021 Acute systolic (congestive) heart failure: Secondary | ICD-10-CM | POA: Diagnosis not present

## 2023-08-06 DIAGNOSIS — Z8673 Personal history of transient ischemic attack (TIA), and cerebral infarction without residual deficits: Secondary | ICD-10-CM | POA: Diagnosis not present

## 2023-08-06 DIAGNOSIS — E039 Hypothyroidism, unspecified: Secondary | ICD-10-CM | POA: Diagnosis not present

## 2023-08-06 DIAGNOSIS — D631 Anemia in chronic kidney disease: Secondary | ICD-10-CM | POA: Diagnosis not present

## 2023-08-06 DIAGNOSIS — Z9181 History of falling: Secondary | ICD-10-CM | POA: Diagnosis not present

## 2023-08-13 DIAGNOSIS — I4891 Unspecified atrial fibrillation: Secondary | ICD-10-CM | POA: Diagnosis not present

## 2023-08-13 DIAGNOSIS — I5033 Acute on chronic diastolic (congestive) heart failure: Secondary | ICD-10-CM | POA: Diagnosis not present

## 2023-08-13 DIAGNOSIS — E876 Hypokalemia: Secondary | ICD-10-CM | POA: Diagnosis not present

## 2023-08-13 DIAGNOSIS — E1122 Type 2 diabetes mellitus with diabetic chronic kidney disease: Secondary | ICD-10-CM | POA: Diagnosis not present

## 2023-08-13 DIAGNOSIS — Z794 Long term (current) use of insulin: Secondary | ICD-10-CM | POA: Diagnosis not present

## 2023-08-13 DIAGNOSIS — Z9181 History of falling: Secondary | ICD-10-CM | POA: Diagnosis not present

## 2023-08-13 DIAGNOSIS — E782 Mixed hyperlipidemia: Secondary | ICD-10-CM | POA: Diagnosis not present

## 2023-08-13 DIAGNOSIS — D509 Iron deficiency anemia, unspecified: Secondary | ICD-10-CM | POA: Diagnosis not present

## 2023-08-13 DIAGNOSIS — E039 Hypothyroidism, unspecified: Secondary | ICD-10-CM | POA: Diagnosis not present

## 2023-08-13 DIAGNOSIS — J9601 Acute respiratory failure with hypoxia: Secondary | ICD-10-CM | POA: Diagnosis not present

## 2023-08-13 DIAGNOSIS — K449 Diaphragmatic hernia without obstruction or gangrene: Secondary | ICD-10-CM | POA: Diagnosis not present

## 2023-08-13 DIAGNOSIS — Z7901 Long term (current) use of anticoagulants: Secondary | ICD-10-CM | POA: Diagnosis not present

## 2023-08-13 DIAGNOSIS — N184 Chronic kidney disease, stage 4 (severe): Secondary | ICD-10-CM | POA: Diagnosis not present

## 2023-08-13 DIAGNOSIS — I5021 Acute systolic (congestive) heart failure: Secondary | ICD-10-CM | POA: Diagnosis not present

## 2023-08-13 DIAGNOSIS — Z8673 Personal history of transient ischemic attack (TIA), and cerebral infarction without residual deficits: Secondary | ICD-10-CM | POA: Diagnosis not present

## 2023-08-13 DIAGNOSIS — K219 Gastro-esophageal reflux disease without esophagitis: Secondary | ICD-10-CM | POA: Diagnosis not present

## 2023-08-13 DIAGNOSIS — D631 Anemia in chronic kidney disease: Secondary | ICD-10-CM | POA: Diagnosis not present

## 2023-08-13 DIAGNOSIS — I130 Hypertensive heart and chronic kidney disease with heart failure and stage 1 through stage 4 chronic kidney disease, or unspecified chronic kidney disease: Secondary | ICD-10-CM | POA: Diagnosis not present

## 2023-08-13 DIAGNOSIS — Z7984 Long term (current) use of oral hypoglycemic drugs: Secondary | ICD-10-CM | POA: Diagnosis not present

## 2023-08-13 DIAGNOSIS — I081 Rheumatic disorders of both mitral and tricuspid valves: Secondary | ICD-10-CM | POA: Diagnosis not present

## 2023-08-14 ENCOUNTER — Encounter: Payer: Self-pay | Admitting: Student

## 2023-08-14 ENCOUNTER — Ambulatory Visit: Attending: Student | Admitting: Student

## 2023-08-14 VITALS — BP 124/70 | HR 62 | Ht 64.0 in | Wt 170.6 lb

## 2023-08-14 DIAGNOSIS — E785 Hyperlipidemia, unspecified: Secondary | ICD-10-CM

## 2023-08-14 DIAGNOSIS — N184 Chronic kidney disease, stage 4 (severe): Secondary | ICD-10-CM | POA: Diagnosis not present

## 2023-08-14 DIAGNOSIS — N049 Nephrotic syndrome with unspecified morphologic changes: Secondary | ICD-10-CM | POA: Diagnosis not present

## 2023-08-14 DIAGNOSIS — I10 Essential (primary) hypertension: Secondary | ICD-10-CM

## 2023-08-14 DIAGNOSIS — I5021 Acute systolic (congestive) heart failure: Secondary | ICD-10-CM

## 2023-08-14 DIAGNOSIS — I4819 Other persistent atrial fibrillation: Secondary | ICD-10-CM | POA: Diagnosis not present

## 2023-08-14 DIAGNOSIS — D638 Anemia in other chronic diseases classified elsewhere: Secondary | ICD-10-CM | POA: Diagnosis not present

## 2023-08-14 DIAGNOSIS — E1122 Type 2 diabetes mellitus with diabetic chronic kidney disease: Secondary | ICD-10-CM | POA: Diagnosis not present

## 2023-08-14 NOTE — Progress Notes (Signed)
 Cardiology Office Note    Date:  08/14/2023  ID:  COLUMBIA PANDEY, DOB [birth year], MRN [id number] Cardiologist: Armida Lander, MD    History of Present Illness:    Holly Baldwin is a 78 y.o. female with past medical history of chronic HFpEF, HTN, HLD, Type 2 DM and Stage 4 CKD who presents to the office today for hospital follow-up.   She was recently admitted to [facility] in 07/2023 for an acute CHF exacerbation and newly diagnosed atrial fibrillation with RVR. Had reported worsening dyspnea and abdominal distension for the past few days leads to admission. BNP was at 957 and Hs Trop values peaked at 49. EKG did show a new LBBB. Echo showed her EF was reduced at 20-25% with global HK. RV function was normal and she did have severely elevated PASP at 72.2 mmHg. Also noted to have moderate MR. Was diagnosed with new-onset atrial fibrillation during admission as well and she received IV Lopressor  and was transitioned to Toprol -XL 150mg  daily prior to discharge. She was started on Eliquis  5mg  BID for anticoagulation and it was recommended to consider DCCV following 3 weeks of anticoagulation. In regards to her cardiomyopathy, she was a poor catheterization candidate given her CKD and medical management was recommended given her CKD. Was recommended to treat medically and obtain a repeat echo in 3-6 months. Discharge weight was 170 lbs.  Was ultimately discharged on Eliquis  5mg  BID, Farxiga  5mg  daily (on this prior to admission), Lasix  40mg  BID, Hydralazine  50mg  BID, Imdur  30mg  daily, Toprol -XL 150mg  daily and Simvastatin  40mg  daily.  In talking with the patient today, she reports overall feeling great since her recent hospitalization. She denies any recurrent shortness of breath at rest or with activity. Reports her main issue prior to admission was frequent coughing and this has now resolved. Denies any chest pain or palpitations. Says that she was overall asymptomatic with her arrhythmia during  admission. She is unsure which medications she currently takes but has a home health nurse who helps with them. Says her blood pressure and heart rate have been well-controlled when they have been checked at home. Remains on Eliquis  for anticoagulation with no reports of active bleeding.  Studies Reviewed:   EKG: EKG is ordered today and demonstrates:   EKG Interpretation Date/Time:  Friday August 14 2023 13:53:42 EDT Ventricular Rate:  62 PR Interval:  224 QRS Duration:  146 QT Interval:  464 QTC Calculation: 470 R Axis:   13  Text Interpretation: Sinus rhythm with 1st degree A-V block Left bundle branch block Confirmed by Woodfin Hays ([id number]) on 08/14/2023 2:07:32 PM       Echocardiogram: 07/2023 IMPRESSIONS     1. Left ventricular ejection fraction, by estimation, is 20 to 25%. The  left ventricle has severely decreased function. The left ventricle  demonstrates global hypokinesis. There is moderate left ventricular  hypertrophy. Left ventricular diastolic  parameters are indeterminate.   2. Right ventricular systolic function is normal. The right ventricular  size is normal. There is severely elevated pulmonary artery systolic  pressure. The estimated right ventricular systolic pressure is 72.2 mmHg.   3. Left atrial size was mildly dilated.   4. The mitral valve is abnormal. Moderate mitral valve regurgitation. No  evidence of mitral stenosis.   5. The tricuspid valve is abnormal.   6. The aortic valve is tricuspid. Aortic valve regurgitation is not  visualized. No aortic stenosis is present.   7. The inferior vena cava is dilated  in size with <50% respiratory  variability, suggesting right atrial pressure of 15 mmHg.    Risk Assessment/Calculations:    CHA2DS2-VASc Score = 6   This indicates a 9.7% annual risk of stroke. The patient's score is based upon: CHF History: 1 HTN History: 1 Diabetes History: 1 Stroke History: 0 Vascular Disease History: 0 Age  Score: 2 Gender Score: 1    Physical Exam:   VS:  BP 124/70 (BP Location: Right Arm, Cuff Size: Normal)   Pulse 62   Ht 5' 4 (1.626 m)   Wt 170 lb 9.6 oz (77.4 kg)   SpO2 97%   BMI 29.28 kg/m    Wt Readings from Last 3 Encounters:  08/14/23 170 lb 9.6 oz (77.4 kg)  07/20/23 170 lb 3.1 oz (77.2 kg)  05/01/23 178 lb 6.4 oz (80.9 kg)     GEN: Well nourished, well developed female appearing in no acute distress NECK: No JVD; No carotid bruits CARDIAC: RRR, no murmurs, rubs, gallops RESPIRATORY:  Clear to auscultation without rales, wheezing or rhonchi  ABDOMEN: Appears non-distended. No obvious abdominal masses. EXTREMITIES: No clubbing or cyanosis. No pitting edema.  Distal pedal pulses are 2+ bilaterally.   Assessment and Plan:   1. Acute HFrEF (heart failure with reduced ejection fraction) (HCC)/Pulmonary HTN - Recent echo showed her EF was reduced at 20-25% and ischemic evaluation was not pursued given her advanced CKD.  - She appears euvolemic by examination today and denies any recent respiratory issues. Her weight has been stable at around 165 lbs on her home scales.  - Continue current medical therapy with Farxiga  5mg  daily (on this prior to admission per Nephrology), Lasix  40mg  BID, Hydralazine  50mg  BID, Imdur  30mg  daily and Toprol -XL 150mg  daily. She has also been on Kerendia for nephrotic proteinuria per Nephrology. No ACE-I/ARB/ARNI given her renal function. Will plan for a follow-up echocardiogram in 3 months for reassessment of her EF and also reassessment of pulmonary pressures as her echocardiogram was obtained in the setting of volume overload.  2. Persistent atrial fibrillation (HCC) - New diagnosis for the patient during her recent admission and unclear if this was happening beforehand as she was overall asymptomatic. She has converted back to normal sinus rhythm in the interim and is in sinus rhythm with heart rate in the 60's today. Will continue Toprol -XL at 150  mg daily for now and encouraged her to follow heart rate in the ambulatory setting. If she develops bradycardia, would reduce to 100 mg daily as she does have underlying conduction disease with first-degree AV block and LBBB by EKG. Will keep Toprol -XL at her current dose for now though as it is unclear if episodes of atrial fibrillation contributed to her cardiomyopathy. - No reports of active bleeding. Continue Eliquis  5 mg twice daily for anticoagulation which is the appropriate dose given her current, age, weight and kidney function. Once she turns 79 in 05/2024, she will likely require dose reduction to 2.5 mg twice daily if her creatinine remains greater than 1.5.  3. Essential hypertension - BP is well-controlled at 124/70 during today's visit. Continue current medical therapy with Hydralazine  50 mg twice daily, Imdur  30 mg daily and Toprol -XL 150 mg daily.  4. Hyperlipidemia LDL goal <70 - Followed by her PCP. LDL was at 67 when checked in 2024. Continue current medical therapy with Simvastatin  40mg  daily.   5. CKD (chronic kidney disease) stage 4, GFR 15-29 ml/min (HCC) - Baseline creatinine 2.0 - 2.2. Stable at 2.18  when checked on 07/29/2023. Followed by Dr. Carrolyn Clan.   Signed, Dorma Gash, PA-C

## 2023-08-14 NOTE — Patient Instructions (Signed)
 Medication Instructions:  Your physician recommends that you continue on your current medications as directed. Please refer to the Current Medication list given to you today.  *If you need a refill on your cardiac medications before your next appointment, please call your pharmacy*  Lab Work: NONE   If you have labs (blood work) drawn today and your tests are completely normal, you will receive your results only by: MyChart Message (if you have MyChart) OR A paper copy in the mail If you have any lab test that is abnormal or we need to change your treatment, we will call you to review the results.  Testing/Procedures: Your physician has requested that you have an echocardiogram. Echocardiography is a painless test that uses sound waves to create images of your heart. It provides your doctor with information about the size and shape of your heart and how well your heart's chambers and valves are working. This procedure takes approximately one hour. There are no restrictions for this procedure. Please do NOT wear cologne, perfume, aftershave, or lotions (deodorant is allowed). Please arrive 15 minutes prior to your appointment time.  Please note: We ask at that you not bring children with you during ultrasound (echo/ vascular) testing. Due to room size and safety concerns, children are not allowed in the ultrasound rooms during exams. Our front office staff cannot provide observation of children in our lobby area while testing is being conducted. An adult accompanying a patient to their appointment will only be allowed in the ultrasound room at the discretion of the ultrasound technician under special circumstances. We apologize for any inconvenience.   Follow-Up: At [facility], you and your health needs are our priority.  As part of our continuing mission to provide you with exceptional heart care, our providers are all part of one team.  This team includes your primary Cardiologist  (physician) and Advanced Practice Providers or APPs (Physician Assistants and Nurse Practitioners) who all work together to provide you with the care you need, when you need it.  Your next appointment:   3 -4  month(s)  Provider:   You may see Armida Lander, MD or one of the following Advanced Practice Providers on your designated Care Team:   Woodfin Hays, PA-C  Scotesia Hamilton, [place] Theotis Flake, [place]     We recommend signing up for the patient portal called MyChart.  Sign up information is provided on this After Visit Summary.  MyChart is used to connect with patients for Virtual Visits (Telemedicine).  Patients are able to view lab/test results, encounter notes, upcoming appointments, etc.  Non-urgent messages can be sent to your provider as well.   To learn more about what you can do with MyChart, go to [email or web address].   Other Instructions Thank you for choosing [facility]!

## 2023-08-20 DIAGNOSIS — E039 Hypothyroidism, unspecified: Secondary | ICD-10-CM | POA: Diagnosis not present

## 2023-08-20 DIAGNOSIS — N184 Chronic kidney disease, stage 4 (severe): Secondary | ICD-10-CM | POA: Diagnosis not present

## 2023-08-20 DIAGNOSIS — I081 Rheumatic disorders of both mitral and tricuspid valves: Secondary | ICD-10-CM | POA: Diagnosis not present

## 2023-08-20 DIAGNOSIS — I5033 Acute on chronic diastolic (congestive) heart failure: Secondary | ICD-10-CM | POA: Diagnosis not present

## 2023-08-20 DIAGNOSIS — Z9181 History of falling: Secondary | ICD-10-CM | POA: Diagnosis not present

## 2023-08-20 DIAGNOSIS — I130 Hypertensive heart and chronic kidney disease with heart failure and stage 1 through stage 4 chronic kidney disease, or unspecified chronic kidney disease: Secondary | ICD-10-CM | POA: Diagnosis not present

## 2023-08-20 DIAGNOSIS — E876 Hypokalemia: Secondary | ICD-10-CM | POA: Diagnosis not present

## 2023-08-20 DIAGNOSIS — Z7901 Long term (current) use of anticoagulants: Secondary | ICD-10-CM | POA: Diagnosis not present

## 2023-08-20 DIAGNOSIS — I4891 Unspecified atrial fibrillation: Secondary | ICD-10-CM | POA: Diagnosis not present

## 2023-08-20 DIAGNOSIS — Z794 Long term (current) use of insulin: Secondary | ICD-10-CM | POA: Diagnosis not present

## 2023-08-20 DIAGNOSIS — J9601 Acute respiratory failure with hypoxia: Secondary | ICD-10-CM | POA: Diagnosis not present

## 2023-08-20 DIAGNOSIS — E1122 Type 2 diabetes mellitus with diabetic chronic kidney disease: Secondary | ICD-10-CM | POA: Diagnosis not present

## 2023-08-20 DIAGNOSIS — E782 Mixed hyperlipidemia: Secondary | ICD-10-CM | POA: Diagnosis not present

## 2023-08-20 DIAGNOSIS — Z7984 Long term (current) use of oral hypoglycemic drugs: Secondary | ICD-10-CM | POA: Diagnosis not present

## 2023-08-20 DIAGNOSIS — D509 Iron deficiency anemia, unspecified: Secondary | ICD-10-CM | POA: Diagnosis not present

## 2023-08-20 DIAGNOSIS — K219 Gastro-esophageal reflux disease without esophagitis: Secondary | ICD-10-CM | POA: Diagnosis not present

## 2023-08-20 DIAGNOSIS — I5021 Acute systolic (congestive) heart failure: Secondary | ICD-10-CM | POA: Diagnosis not present

## 2023-08-20 DIAGNOSIS — K449 Diaphragmatic hernia without obstruction or gangrene: Secondary | ICD-10-CM | POA: Diagnosis not present

## 2023-08-20 DIAGNOSIS — Z8673 Personal history of transient ischemic attack (TIA), and cerebral infarction without residual deficits: Secondary | ICD-10-CM | POA: Diagnosis not present

## 2023-08-20 DIAGNOSIS — D631 Anemia in chronic kidney disease: Secondary | ICD-10-CM | POA: Diagnosis not present

## 2023-08-28 DIAGNOSIS — J9601 Acute respiratory failure with hypoxia: Secondary | ICD-10-CM | POA: Diagnosis not present

## 2023-08-28 DIAGNOSIS — D631 Anemia in chronic kidney disease: Secondary | ICD-10-CM | POA: Diagnosis not present

## 2023-08-28 DIAGNOSIS — I5021 Acute systolic (congestive) heart failure: Secondary | ICD-10-CM | POA: Diagnosis not present

## 2023-08-28 DIAGNOSIS — K449 Diaphragmatic hernia without obstruction or gangrene: Secondary | ICD-10-CM | POA: Diagnosis not present

## 2023-08-28 DIAGNOSIS — I4891 Unspecified atrial fibrillation: Secondary | ICD-10-CM | POA: Diagnosis not present

## 2023-08-28 DIAGNOSIS — I130 Hypertensive heart and chronic kidney disease with heart failure and stage 1 through stage 4 chronic kidney disease, or unspecified chronic kidney disease: Secondary | ICD-10-CM | POA: Diagnosis not present

## 2023-08-28 DIAGNOSIS — N184 Chronic kidney disease, stage 4 (severe): Secondary | ICD-10-CM | POA: Diagnosis not present

## 2023-08-28 DIAGNOSIS — I5033 Acute on chronic diastolic (congestive) heart failure: Secondary | ICD-10-CM | POA: Diagnosis not present

## 2023-08-28 DIAGNOSIS — Z9181 History of falling: Secondary | ICD-10-CM | POA: Diagnosis not present

## 2023-08-28 DIAGNOSIS — Z7901 Long term (current) use of anticoagulants: Secondary | ICD-10-CM | POA: Diagnosis not present

## 2023-08-28 DIAGNOSIS — E039 Hypothyroidism, unspecified: Secondary | ICD-10-CM | POA: Diagnosis not present

## 2023-08-28 DIAGNOSIS — E782 Mixed hyperlipidemia: Secondary | ICD-10-CM | POA: Diagnosis not present

## 2023-08-28 DIAGNOSIS — Z7984 Long term (current) use of oral hypoglycemic drugs: Secondary | ICD-10-CM | POA: Diagnosis not present

## 2023-08-28 DIAGNOSIS — K219 Gastro-esophageal reflux disease without esophagitis: Secondary | ICD-10-CM | POA: Diagnosis not present

## 2023-08-28 DIAGNOSIS — I081 Rheumatic disorders of both mitral and tricuspid valves: Secondary | ICD-10-CM | POA: Diagnosis not present

## 2023-08-28 DIAGNOSIS — D509 Iron deficiency anemia, unspecified: Secondary | ICD-10-CM | POA: Diagnosis not present

## 2023-08-28 DIAGNOSIS — E876 Hypokalemia: Secondary | ICD-10-CM | POA: Diagnosis not present

## 2023-08-28 DIAGNOSIS — Z8673 Personal history of transient ischemic attack (TIA), and cerebral infarction without residual deficits: Secondary | ICD-10-CM | POA: Diagnosis not present

## 2023-08-28 DIAGNOSIS — Z794 Long term (current) use of insulin: Secondary | ICD-10-CM | POA: Diagnosis not present

## 2023-08-28 DIAGNOSIS — E1122 Type 2 diabetes mellitus with diabetic chronic kidney disease: Secondary | ICD-10-CM | POA: Diagnosis not present

## 2023-08-31 ENCOUNTER — Ambulatory Visit (INDEPENDENT_AMBULATORY_CARE_PROVIDER_SITE_OTHER): Payer: 59 | Admitting: Nurse Practitioner

## 2023-08-31 ENCOUNTER — Encounter: Payer: Self-pay | Admitting: Nurse Practitioner

## 2023-08-31 VITALS — BP 120/66 | HR 64 | Ht 64.0 in | Wt 167.2 lb

## 2023-08-31 DIAGNOSIS — E1122 Type 2 diabetes mellitus with diabetic chronic kidney disease: Secondary | ICD-10-CM

## 2023-08-31 DIAGNOSIS — Z7984 Long term (current) use of oral hypoglycemic drugs: Secondary | ICD-10-CM | POA: Diagnosis not present

## 2023-08-31 DIAGNOSIS — Z794 Long term (current) use of insulin: Secondary | ICD-10-CM | POA: Diagnosis not present

## 2023-08-31 DIAGNOSIS — E039 Hypothyroidism, unspecified: Secondary | ICD-10-CM

## 2023-08-31 DIAGNOSIS — N184 Chronic kidney disease, stage 4 (severe): Secondary | ICD-10-CM | POA: Diagnosis not present

## 2023-08-31 DIAGNOSIS — E782 Mixed hyperlipidemia: Secondary | ICD-10-CM | POA: Diagnosis not present

## 2023-08-31 DIAGNOSIS — I10 Essential (primary) hypertension: Secondary | ICD-10-CM | POA: Diagnosis not present

## 2023-08-31 LAB — POCT GLYCOSYLATED HEMOGLOBIN (HGB A1C)

## 2023-08-31 MED ORDER — LEVOTHYROXINE SODIUM 100 MCG PO TABS: 100.0000 ug | ORAL_TABLET | Freq: Every day | ORAL | 1 refills | Status: DC

## 2023-08-31 MED ORDER — DAPAGLIFLOZIN PROPANEDIOL 5 MG PO TABS: 5.0000 mg | ORAL_TABLET | Freq: Every day | ORAL | 1 refills | Status: DC

## 2023-08-31 MED ORDER — LANTUS SOLOSTAR 100 UNIT/ML ~~LOC~~ SOPN: 15.0000 [IU] | PEN_INJECTOR | Freq: Every day | SUBCUTANEOUS | 3 refills | Status: DC

## 2023-08-31 NOTE — Patient Instructions (Signed)

## 2023-08-31 NOTE — Progress Notes (Signed)
 08/31/2023      Endocrinology follow-up note   Subjective:    Patient ID: Holly Baldwin, female    DOB: 03/27/1945,    Past Medical History:  Diagnosis Date   Anxiety    Chronic abdominal pain    Diabetes mellitus    GERD (gastroesophageal reflux disease)    Hiatal hernia    Hypercholesteremia    Hypertension    Past Surgical History:  Procedure Laterality Date   ABDOMINAL HYSTERECTOMY     CATARACT EXTRACTION W/PHACO Right 01/09/2020   Procedure: CATARACT EXTRACTION PHACO AND INTRAOCULAR LENS PLACEMENT (IOC);  Surgeon: Harrie Agent, MD;  Location: [facility];  Service: Ophthalmology;  Laterality: Right;  CDE: 12.01   CATARACT EXTRACTION W/PHACO Left 01/23/2020   Procedure: CATARACT EXTRACTION PHACO AND INTRAOCULAR LENS PLACEMENT LEFT EYE;  Surgeon: Harrie Agent, MD;  Location: [facility];  Service: Ophthalmology;  Laterality: Left;  CDE 8.71   COLONOSCOPY N/A 06/23/2012   Procedure: COLONOSCOPY;  Surgeon: Claudis RAYMOND Rivet, MD;  Location: [facility];  Service: Endoscopy;  Laterality: N/A;  100-moved to 1200 Ann to notify pt   ESOPHAGOGASTRODUODENOSCOPY N/A 05/06/2012   Procedure: ESOPHAGOGASTRODUODENOSCOPY (EGD);  Surgeon: Claudis RAYMOND Rivet, MD;  Location: [facility];  Service: Endoscopy;  Laterality: N/A;   MASS EXCISION Right 05/26/2012   Procedure: EXCISION NEOPLASM RIGHT THIGH;  Surgeon: Oneil DELENA Budge, MD;  Location: [facility];  Service: General;  Laterality: Right;   Social History   Socioeconomic History   Marital status: Married    Spouse name: Not on file   Number of children: Not on file   Years of education: Not on file   Highest education level: Not on file  Occupational History   Not on file  Tobacco Use   Smoking status: Never   Smokeless tobacco: Never  Vaping Use   Vaping status: Never Used  Substance and Sexual Activity   Alcohol use: No   Drug use: No   Sexual activity: Not Currently    Birth control/protection: Post-menopausal  Other Topics Concern    Not on file  Social History Narrative   Not on file   Social Drivers of Health   Financial Resource Strain: Low Risk  (01/20/2023)   Overall Financial Resource Strain (CARDIA)    Difficulty of Paying Living Expenses: Not very hard  Food Insecurity: No Food Insecurity (07/17/2023)   Hunger Vital Sign    Worried About Running Out of Food in the Last Year: Never true    Ran Out of Food in the Last Year: Never true  Transportation Needs: No Transportation Needs (07/17/2023)   PRAPARE - Administrator, Civil Service (Medical): No    Lack of Transportation (Non-Medical): No  Physical Activity: Inactive (12/17/2022)   Exercise Vital Sign    Days of Exercise per Week: 0 days    Minutes of Exercise per Session: 0 min  Stress: No Stress Concern Present (05/07/2020)   [employer] of Occupational Health - Occupational Stress Questionnaire    Feeling of Stress : Not at all  Social Connections: Moderately Integrated (07/17/2023)   Social Connection and Isolation Panel    Frequency of Communication with Friends and Family: More than three times a week    Frequency of Social Gatherings with Friends and Family: Never    Attends Religious Services: More than 4 times per year    Active Member of [employer] or Organizations: No    Attends Banker Meetings:  Never    Marital Status: Married   Outpatient Encounter Medications as of 08/31/2023  Medication Sig   albuterol  (VENTOLIN  HFA) 108 (90 Base) MCG/ACT inhaler Inhale 2 puffs into the lungs every 6 (six) hours as needed for wheezing or shortness of breath.   apixaban  (ELIQUIS ) 5 MG TABS tablet Take 1 tablet (5 mg total) by mouth 2 (two) times daily.   escitalopram (LEXAPRO) 10 MG tablet Take 10 mg by mouth daily.   feeding supplement (ENSURE ENLIVE / ENSURE PLUS) LIQD Take 237 mLs by mouth 2 (two) times daily between meals.   furosemide  (LASIX ) 40 MG tablet Take 1 tablet (40 mg total) by mouth 2 (two) times daily.   Homeopathic  Products (LEG CRAMPS PM SL) Take 1 tablet by mouth daily as needed (Cramps).   hydrALAZINE  (APRESOLINE ) 50 MG tablet Take 1 tablet (50 mg total) by mouth 2 (two) times daily.   KERENDIA 10 MG TABS Take 1 tablet by mouth daily.   Multiple Vitamins-Minerals (PRESERVISION AREDS 2) CAPS Take by mouth 2 (two) times daily.   pantoprazole  (PROTONIX ) 40 MG tablet Take 1 tablet (40 mg total) by mouth daily.   simvastatin  (ZOCOR ) 40 MG tablet TAKE 1 TABLET EVERY DAY   ziprasidone  (GEODON ) 40 MG capsule Take 40 mg by mouth at bedtime.   [DISCONTINUED] dapagliflozin  propanediol (FARXIGA ) 5 MG TABS tablet Take 1 tablet (5 mg total) by mouth daily.   [DISCONTINUED] insulin  glargine (LANTUS  SOLOSTAR) 100 UNIT/ML Solostar Pen Inject 15 Units into the skin at bedtime.   [DISCONTINUED] levothyroxine  (SYNTHROID ) 100 MCG tablet Take 100 mcg by mouth daily before breakfast.   dapagliflozin  propanediol (FARXIGA ) 5 MG TABS tablet Take 1 tablet (5 mg total) by mouth daily.   insulin  glargine (LANTUS  SOLOSTAR) 100 UNIT/ML Solostar Pen Inject 15 Units into the skin at bedtime.   isosorbide  mononitrate (IMDUR ) 30 MG 24 hr tablet Take 1 tablet (30 mg total) by mouth daily. (Patient not taking: Reported on 08/31/2023)   levothyroxine  (SYNTHROID ) 100 MCG tablet Take 1 tablet (100 mcg total) by mouth daily before breakfast.   metoprolol  succinate (TOPROL -XL) 50 MG 24 hr tablet Take 3 tablets (150 mg total) by mouth daily. Take with or immediately following a meal. (Patient not taking: Reported on 08/31/2023)   No facility-administered encounter medications on file as of 08/31/2023.   ALLERGIES: Allergies  Allergen Reactions   Bee Venom Anaphylaxis   Penicillins Hives   VACCINATION STATUS:  There is no immunization history on file for this patient.  Diabetes She presents for her follow-up diabetic visit. She has type 2 diabetes mellitus. Onset time: she was diagnosed at approximate age of 49 years. Her disease course has  been stable. There are no hypoglycemic associated symptoms. Pertinent negatives for hypoglycemia include no confusion, headaches, nervousness/anxiousness, pallor, seizures or tremors. Associated symptoms include weight loss. Pertinent negatives for diabetes include no chest pain, no fatigue, no polydipsia, no polyphagia and no polyuria. There are no hypoglycemic complications. Symptoms are stable. Diabetic complications include nephropathy. Risk factors for coronary artery disease include diabetes mellitus, dyslipidemia, obesity, sedentary lifestyle and hypertension. Current diabetic treatment includes oral agent (monotherapy) and insulin  injections. She is compliant with treatment most of the time. Her weight is decreasing steadily. She is following a generally healthy diet. When asked about meal planning, she reported none. She has not had a previous visit with a dietitian. She never participates in exercise. Her home blood glucose trend is decreasing steadily. Her overall blood glucose range  is 140-180 mg/dl. (She presents today with her CGM showing at goal glycemic profile overall.  Her POCT A1c today is 7.2%, increasing slightly from last visit of 7%.  Analysis of her CGM shows TIR 86%, TAR 14%, TBR 0% with a GMI of 6.9%.  She was hospitalized between visits for CHF but is feeling much better with recent med changes.) An ACE inhibitor/angiotensin II receptor blocker is being taken. She does not see a podiatrist.Eye exam is current.  Thyroid  Problem Presents for follow-up (She is currently on levothyroxine  137 mcg p.o. nightly.  She reports compliance with medication.) visit. Symptoms include weight loss. Patient reports no anxiety, cold intolerance, constipation, depressed mood, diarrhea, fatigue, heat intolerance, palpitations, tremors or weight gain. The symptoms have been stable. Her past medical history is significant for diabetes.  Hyperlipidemia This is a chronic problem. The current episode started  more than 1 year ago. The problem is controlled. Recent lipid tests were reviewed and are normal. Exacerbating diseases include chronic renal disease, diabetes and obesity. Factors aggravating her hyperlipidemia include beta blockers and fatty foods. Pertinent negatives include no chest pain, myalgias or shortness of breath. Current antihyperlipidemic treatment includes statins. The current treatment provides mild improvement of lipids. Compliance problems include adherence to diet and adherence to exercise.  Risk factors for coronary artery disease include dyslipidemia, diabetes mellitus, hypertension, obesity, a sedentary lifestyle and post-menopausal.  Hypertension This is a chronic problem. The current episode started more than 1 year ago. The problem has been resolved since onset. The problem is controlled. Pertinent negatives include no chest pain, headaches, palpitations or shortness of breath. Agents associated with hypertension include thyroid  hormones. Risk factors for coronary artery disease include dyslipidemia, diabetes mellitus, obesity, post-menopausal state and sedentary lifestyle. Past treatments include calcium  channel blockers, central alpha agonists and diuretics. The current treatment provides mild improvement. Compliance problems include exercise and diet.  Hypertensive end-organ damage includes kidney disease. Identifiable causes of hypertension include chronic renal disease and a thyroid  problem.     Review of systems  Constitutional: + decreasing body weight,  current Body mass index is 28.7 kg/m. , no fatigue, no subjective hyperthermia, no subjective hypothermia Eyes: no blurry vision, no xerophthalmia ENT: no sore throat, no nodules palpated in throat, no dysphagia/odynophagia, no hoarseness Cardiovascular: no chest pain, no shortness of breath, no palpitations, no leg swelling Respiratory: no cough, no shortness of breath Gastrointestinal: no  nausea/vomiting/diarrhea Musculoskeletal: no muscle/joint aches Skin: no rashes, no hyperemia Neurological: no tremors, no numbness, no tingling, no dizziness Psychiatric: no depression, no anxiety   Objective:    BP 120/66 (BP Location: Left Arm, Patient Position: Sitting, Cuff Size: Large)   Pulse 64   Ht 5' 4 (1.626 m)   Wt 167 lb 3.2 oz (75.8 kg)   BMI 28.70 kg/m   Wt Readings from Last 3 Encounters:  08/31/23 167 lb 3.2 oz (75.8 kg)  08/14/23 170 lb 9.6 oz (77.4 kg)  07/20/23 170 lb 3.1 oz (77.2 kg)    BP Readings from Last 3 Encounters:  08/31/23 120/66  08/14/23 124/70  07/20/23 (!) 118/90     Physical Exam- Limited  Constitutional:  Body mass index is 28.7 kg/m. , not in acute distress, ? Memory impairment Eyes:  EOMI, no exophthalmos Musculoskeletal: no gross deformities, strength intact in all four extremities, no gross restriction of joint movements Skin:  no rashes, no hyperemia Neurological: no tremor with outstretched hands   Diabetic Foot Exam - Simple   No data  filed     Results for orders placed or performed in visit on 08/31/23  HgB A1c   Collection Time: 08/31/23  2:40 PM  Result Value Ref Range   Hemoglobin A1C     HbA1c POC (<> result, manual entry)     HbA1c, POC (prediabetic range)     HbA1c, POC (controlled diabetic range)     Diabetic Labs (most recent): Lab Results  Component Value Date   HGBA1C 7.0 (A) 05/01/2023   HGBA1C 7.0 (A) 12/10/2022   HGBA1C 7.7 (A) 08/06/2022   MICROALBUR 172.4 12/14/2017   MICROALBUR >600.0 03/09/2017   MICROALBUR 10.7 01/23/2016   Lipid Panel     Component Value Date/Time   CHOL 139 07/21/2022 0847   CHOL 184 05/09/2020 0932   TRIG 155 (H) 07/21/2022 0847   HDL 41 07/21/2022 0847   HDL 42 05/09/2020 0932   CHOLHDL 3.4 07/21/2022 0847   VLDL 31 07/21/2022 0847   LDLCALC 67 07/21/2022 0847   LDLCALC 108 (H) 05/09/2020 0932   LDLCALC 105 (H) 12/14/2017 0921     Assessment & Plan:   1)  Controlled type 2 diabetes mellitus with complication  Her diabetes is complicated by CAD, neuropathy, microalbuminuria, worsening CKD,  and patient remains at a high risk for more acute and chronic complications of diabetes which include CAD, CVA, CKD, retinopathy, and neuropathy. These are all discussed in detail with the patient.  She presents today with her CGM showing at goal glycemic profile overall.  Her POCT A1c today is 7.2%, increasing slightly from last visit of 7%.  Analysis of her CGM shows TIR 86%, TAR 14%, TBR 0% with a GMI of 6.9%.  She was hospitalized between visits for CHF but is feeling much better with recent med changes.  - Nutritional counseling repeated at each appointment due to patients tendency to fall back in to old habits.  - The patient admits there is a room for improvement in their diet and drink choices. -  Suggestion is made for the patient to avoid simple carbohydrates from their diet including Cakes, Sweet Desserts / Pastries, Ice Cream, Soda (diet and regular), Sweet Tea, Candies, Chips, Cookies, Sweet Pastries, Store Bought Juices, Alcohol in Excess of 1-2 drinks a day, Artificial Sweeteners, Coffee Creamer, and Sugar-free Products. This will help patient to have stable blood glucose profile and potentially avoid unintended weight gain.   - I encouraged the patient to switch to unprocessed or minimally processed complex starch and increased protein intake (animal or plant source), fruits, and vegetables.   - Patient is advised to stick to a routine mealtimes to eat 3 meals a day and avoid unnecessary snacks (to snack only to correct hypoglycemia).  - I have approached patient with the following individualized plan to manage diabetes and patient agrees.  -For safety purposes, Glipizide  should be avoided. With her renal impairment and advanced age, Glipizide  increases her risk of hypoglycemia significantly.  Every attempt will be made to decrease her prandial  insulin  due to potential memory impairment which makes hypoglycemia a significant risk for her.    -Due to at goal glycemic profile and lack of hypoglycemia, no changes will be made to her regimen today.  She is advised to continue Lantus  15 units SQ nightly and Farxiga  5 mg po daily as recommended by her nephrologist/cardiologist.  -she is encouraged to continue monitoring blood glucose (using her CGM) at least 4 times daily, before meals and at bedtime and call the clinic if  she has readings less than 70 or greater than 200 for 3 tests in a row.  - Patient specific target  for A1c; LDL, HDL, Triglycerides, and  Waist Circumference were discussed in detail.  2) BP/HTN:  Her blood pressure is controlled to target.  She is advised to continue meds as prescribed by cardiology/nephrology.  3) Lipids/HPL: Her most recent lipid panel on 07/21/22 shows controlled LDL at 67.  She is advised to continue Atorvastatin  40 mg po daily at bedtime.  Side effects and precautions discussed with her.    4)  Weight/Diet:  Her Body mass index is 28.7 kg/m. She will benefit from continued modest weight loss.  CDE consult in progress, exercise, and carbohydrates information provided.  5) Hypothyroidism: -Her previsit TFTs are consistent with appropriate hormone replacement.  She is advised to continue Levothyroxine  100 mg po daily before breakfast (lowered during recent hospitalization)  Will recheck TFTs prior to next visit and adjust dose accordingly.   - We discussed about the correct intake of her thyroid  hormone, on empty stomach at fasting, with water , separated by at least 30 minutes from breakfast and other medications,  and separated by more than 4 hours from calcium , iron, multivitamins, acid reflux medications (PPIs). -Patient is made aware of the fact that thyroid  hormone replacement is needed for life, dose to be adjusted by periodic monitoring of thyroid  function tests.  6) Chronic Care/Health  Maintenance: -Patient is on ACE and Statin medications and encouraged to continue to follow up with Ophthalmology, Podiatrist at least yearly or according to recommendations, and advised to  stay away from smoking. I have recommended yearly flu vaccine and pneumonia vaccination at least every 5 years; moderate intensity exercise for up to 150 minutes weekly; and  sleep for at least 7 hours a day.  I advised patient to maintain close follow up with her PCP for primary care needs.      I spent  30  minutes in the care of the patient today including review of labs from CMP, Lipids, Thyroid  Function, Hematology (current and previous including abstractions from other facilities); face-to-face time discussing  her blood glucose readings/logs, discussing hypoglycemia and hyperglycemia episodes and symptoms, medications doses, her options of short and long term treatment based on the latest standards of care / guidelines;  discussion about incorporating lifestyle medicine;  and documenting the encounter. Risk reduction counseling performed per USPSTF guidelines to reduce obesity and cardiovascular risk factors.     Please refer to Patient Instructions for Blood Glucose Monitoring and Insulin /Medications Dosing Guide  in media tab for additional information. Please  also refer to  Patient Self Inventory in the Media  tab for reviewed elements of pertinent patient history.  Mysha B Hott participated in the discussions, expressed understanding, and voiced agreement with the above plans.  All questions were answered to her satisfaction. she is encouraged to contact clinic should she have any questions or concerns prior to her return visit.    Follow up plan: Return in about 4 months (around 12/31/2023) for Diabetes F/U with A1c in office, Thyroid  follow up, Previsit labs.   Benton Rio, [facility] [address] [place], [place] [address] Phone:  [phone] Fax: [phone]  08/31/2023, 2:51 PM

## 2023-09-02 ENCOUNTER — Encounter (INDEPENDENT_AMBULATORY_CARE_PROVIDER_SITE_OTHER): Payer: Self-pay | Admitting: *Deleted

## 2023-09-03 ENCOUNTER — Encounter (HOSPITAL_COMMUNITY): Payer: Self-pay

## 2023-09-03 ENCOUNTER — Emergency Department (HOSPITAL_COMMUNITY)

## 2023-09-03 ENCOUNTER — Emergency Department (HOSPITAL_COMMUNITY)
Admission: EM | Admit: 2023-09-03 | Discharge: 2023-09-03 | Disposition: A | Discharge status: Home / Self Care | Attending: Emergency Medicine | Admitting: Emergency Medicine

## 2023-09-03 ENCOUNTER — Other Ambulatory Visit: Payer: Self-pay

## 2023-09-03 DIAGNOSIS — I5021 Acute systolic (congestive) heart failure: Secondary | ICD-10-CM | POA: Diagnosis not present

## 2023-09-03 DIAGNOSIS — K219 Gastro-esophageal reflux disease without esophagitis: Secondary | ICD-10-CM | POA: Diagnosis not present

## 2023-09-03 DIAGNOSIS — I5033 Acute on chronic diastolic (congestive) heart failure: Secondary | ICD-10-CM | POA: Diagnosis not present

## 2023-09-03 DIAGNOSIS — Z7901 Long term (current) use of anticoagulants: Secondary | ICD-10-CM | POA: Insufficient documentation

## 2023-09-03 DIAGNOSIS — N184 Chronic kidney disease, stage 4 (severe): Secondary | ICD-10-CM | POA: Diagnosis not present

## 2023-09-03 DIAGNOSIS — E039 Hypothyroidism, unspecified: Secondary | ICD-10-CM | POA: Diagnosis not present

## 2023-09-03 DIAGNOSIS — M50322 Other cervical disc degeneration at C5-C6 level: Secondary | ICD-10-CM | POA: Diagnosis not present

## 2023-09-03 DIAGNOSIS — I081 Rheumatic disorders of both mitral and tricuspid valves: Secondary | ICD-10-CM | POA: Diagnosis not present

## 2023-09-03 DIAGNOSIS — N189 Chronic kidney disease, unspecified: Secondary | ICD-10-CM | POA: Insufficient documentation

## 2023-09-03 DIAGNOSIS — D631 Anemia in chronic kidney disease: Secondary | ICD-10-CM | POA: Diagnosis not present

## 2023-09-03 DIAGNOSIS — Z7984 Long term (current) use of oral hypoglycemic drugs: Secondary | ICD-10-CM | POA: Diagnosis not present

## 2023-09-03 DIAGNOSIS — K449 Diaphragmatic hernia without obstruction or gangrene: Secondary | ICD-10-CM | POA: Diagnosis not present

## 2023-09-03 DIAGNOSIS — I6529 Occlusion and stenosis of unspecified carotid artery: Secondary | ICD-10-CM | POA: Diagnosis not present

## 2023-09-03 DIAGNOSIS — M85812 Other specified disorders of bone density and structure, left shoulder: Secondary | ICD-10-CM | POA: Diagnosis not present

## 2023-09-03 DIAGNOSIS — J9601 Acute respiratory failure with hypoxia: Secondary | ICD-10-CM | POA: Diagnosis not present

## 2023-09-03 DIAGNOSIS — D509 Iron deficiency anemia, unspecified: Secondary | ICD-10-CM | POA: Diagnosis not present

## 2023-09-03 DIAGNOSIS — M5412 Radiculopathy, cervical region: Secondary | ICD-10-CM | POA: Insufficient documentation

## 2023-09-03 DIAGNOSIS — I4891 Unspecified atrial fibrillation: Secondary | ICD-10-CM | POA: Diagnosis not present

## 2023-09-03 DIAGNOSIS — I509 Heart failure, unspecified: Secondary | ICD-10-CM | POA: Insufficient documentation

## 2023-09-03 DIAGNOSIS — I130 Hypertensive heart and chronic kidney disease with heart failure and stage 1 through stage 4 chronic kidney disease, or unspecified chronic kidney disease: Secondary | ICD-10-CM | POA: Diagnosis not present

## 2023-09-03 DIAGNOSIS — E1122 Type 2 diabetes mellitus with diabetic chronic kidney disease: Secondary | ICD-10-CM | POA: Diagnosis not present

## 2023-09-03 DIAGNOSIS — M19012 Primary osteoarthritis, left shoulder: Secondary | ICD-10-CM | POA: Diagnosis not present

## 2023-09-03 DIAGNOSIS — E876 Hypokalemia: Secondary | ICD-10-CM | POA: Diagnosis not present

## 2023-09-03 DIAGNOSIS — M79602 Pain in left arm: Secondary | ICD-10-CM | POA: Diagnosis present

## 2023-09-03 DIAGNOSIS — Z8673 Personal history of transient ischemic attack (TIA), and cerebral infarction without residual deficits: Secondary | ICD-10-CM | POA: Diagnosis not present

## 2023-09-03 DIAGNOSIS — Z794 Long term (current) use of insulin: Secondary | ICD-10-CM | POA: Diagnosis not present

## 2023-09-03 DIAGNOSIS — M25512 Pain in left shoulder: Secondary | ICD-10-CM | POA: Diagnosis not present

## 2023-09-03 DIAGNOSIS — E782 Mixed hyperlipidemia: Secondary | ICD-10-CM | POA: Diagnosis not present

## 2023-09-03 DIAGNOSIS — Z9181 History of falling: Secondary | ICD-10-CM | POA: Diagnosis not present

## 2023-09-03 MED ORDER — TRAMADOL HCL 50 MG PO TABS: 50.0000 mg | ORAL_TABLET | Freq: Four times a day (QID) | ORAL | 0 refills | Status: DC | PRN

## 2023-09-03 MED ORDER — METHYLPREDNISOLONE 4 MG PO TBPK: ORAL_TABLET | ORAL | 0 refills | Status: DC

## 2023-09-03 NOTE — ED Notes (Signed)
 Pt is a diabetic and has a dexcom on her right upper arm.

## 2023-09-03 NOTE — ED Notes (Signed)
 ED Provider at bedside.

## 2023-09-03 NOTE — ED Notes (Signed)
 Pt returned from CT

## 2023-09-03 NOTE — ED Notes (Signed)
 Patient transported to X-ray

## 2023-09-03 NOTE — ED Triage Notes (Signed)
 Pt arrived via POV c/o left arm pain for the past week. Pt denies injury. Pt reports pain is 10 out of 10 and says it's just aching. Pt unsure if it may be arthritis.

## 2023-09-03 NOTE — Discharge Instructions (Addendum)
 You may alternate ice and heat to your neck and shoulder area.  Take the medication as directed.  You have been prescribed prednisone  to help with your symptoms.  This medication can sometimes elevate your blood sugar.  I recommend that you monitor your blood sugars closely while taking the medication.  If your sugar becomes too elevated you will need to stop the prednisone .  Follow-up with your primary care provider early next week for recheck.

## 2023-09-03 NOTE — ED Provider Notes (Signed)
 [place] EMERGENCY DEPARTMENT AT [facility] Provider Note   CSN: [id number] Arrival date & time: 09/03/23  1229     Patient presents with: Arm Pain   Holly Baldwin is a 78 y.o. female.    Arm Pain Pertinent negatives include no chest pain, no abdominal pain, no headaches and no shortness of breath.       Holly Baldwin is a 78 y.o. female past medical history of impingement syndrome shoulder pain, CHF hypertension and CKD who presents to the Emergency Department complaining of ongoing left arm pain and shoulder pain for 1 month.  Denies any known injury.  States she has constant pain that radiates from her left neck into her shoulder and down to the level of her elbow.  Pain is worse with movement of her neck and shoulder.  She denies any numbness of her extremity, chest pain, shortness of breath jaw pain nausea or vomiting.  She took Tylenol  yesterday with temporary relief.  States that she contacted her PCP and was told to come here for evaluation.  Prior to Admission medications   Medication Sig Start Date End Date Taking? Authorizing Provider  albuterol  (VENTOLIN  HFA) 108 (90 Base) MCG/ACT inhaler Inhale 2 puffs into the lungs every 6 (six) hours as needed for wheezing or shortness of breath. 07/20/23   Evonnie Lenis, MD  apixaban  (ELIQUIS ) 5 MG TABS tablet Take 1 tablet (5 mg total) by mouth 2 (two) times daily. 07/20/23   Evonnie Lenis, MD  dapagliflozin  propanediol (FARXIGA ) 5 MG TABS tablet Take 1 tablet (5 mg total) by mouth daily. 08/31/23   Therisa Benton PARAS, NP  escitalopram (LEXAPRO) 10 MG tablet Take 10 mg by mouth daily. 04/11/21   [provider]  feeding supplement (ENSURE ENLIVE / ENSURE PLUS) LIQD Take 237 mLs by mouth 2 (two) times daily between meals. 07/20/23   Evonnie Lenis, MD  furosemide  (LASIX ) 40 MG tablet Take 1 tablet (40 mg total) by mouth 2 (two) times daily. 07/20/23   Evonnie Lenis, MD  Homeopathic Products (LEG CRAMPS PM SL) Take 1 tablet by mouth  daily as needed (Cramps).    [provider]  hydrALAZINE  (APRESOLINE ) 50 MG tablet Take 1 tablet (50 mg total) by mouth 2 (two) times daily. 07/20/23   Evonnie Lenis, MD  insulin  glargine (LANTUS  SOLOSTAR) 100 UNIT/ML Solostar Pen Inject 15 Units into the skin at bedtime. 08/31/23   Therisa Benton PARAS, NP  isosorbide  mononitrate (IMDUR ) 30 MG 24 hr tablet Take 1 tablet (30 mg total) by mouth daily. Patient not taking: Reported on 08/31/2023 07/21/23   Tat, Lenis, MD  KERENDIA 10 MG TABS Take 1 tablet by mouth daily. 08/06/23   [provider]  levothyroxine  (SYNTHROID ) 100 MCG tablet Take 1 tablet (100 mcg total) by mouth daily before breakfast. 08/31/23   Therisa Benton PARAS, NP  metoprolol  succinate (TOPROL -XL) 50 MG 24 hr tablet Take 3 tablets (150 mg total) by mouth daily. Take with or immediately following a meal. Patient not taking: Reported on 08/31/2023 07/21/23   Tat, Lenis, MD  Multiple Vitamins-Minerals (PRESERVISION AREDS 2) CAPS Take by mouth 2 (two) times daily.    [provider]  pantoprazole  (PROTONIX ) 40 MG tablet Take 1 tablet (40 mg total) by mouth daily. 07/20/23   Evonnie Lenis, MD  simvastatin  (ZOCOR ) 40 MG tablet TAKE 1 TABLET EVERY DAY 10/05/17   Antonetta Rollene BRAVO, MD  ziprasidone  (GEODON ) 40 MG capsule Take 40 mg by mouth  at bedtime. 06/27/23   [provider]    Allergies: Bee venom and Penicillins    Review of Systems  Constitutional:  Negative for chills and fever.  Eyes:  Negative for visual disturbance.  Respiratory:  Negative for shortness of breath.   Cardiovascular:  Negative for chest pain.  Gastrointestinal:  Negative for abdominal pain, nausea and vomiting.  Musculoskeletal:  Positive for arthralgias (left shoulder pain) and neck pain.  Skin:  Negative for color change and rash.  Neurological:  Negative for dizziness, weakness, numbness and headaches.    Updated Vital Signs BP 133/74 (BP Location: Right Wrist)   Pulse (!) 58    Temp 98.5 F (36.9 C)   Resp 19   Ht 5' 4 (1.626 m)   Wt 84.8 kg   SpO2 99%   BMI 32.10 kg/m   Physical Exam Vitals and nursing note reviewed.  Constitutional:      General: She is not in acute distress.    Appearance: Normal appearance. She is not ill-appearing or toxic-appearing.  Cardiovascular:     Rate and Rhythm: Normal rate and regular rhythm.     Pulses: Normal pulses.  Pulmonary:     Effort: Pulmonary effort is normal.  Musculoskeletal:        General: Tenderness present.     Left shoulder: Tenderness present. No swelling or deformity. Decreased range of motion. Normal strength. Normal pulse.     Comments: Pain with abduction left shoulder.  No bony deformity or edema.  Tenderness with palpation along the left trapezius muscle and left cervical paraspinal muscles.  Skin:    General: Skin is warm.     Capillary Refill: Capillary refill takes less than 2 seconds.  Neurological:     General: No focal deficit present.     Mental Status: She is alert.     Sensory: No sensory deficit.     Motor: No weakness.     (all labs ordered are listed, but only abnormal results are displayed) Labs Reviewed - No data to display  EKG: EKG Interpretation Date/Time:  Thursday September 03 2023 13:54:02 EDT Ventricular Rate:  58 PR Interval:  236 QRS Duration:  177 QT Interval:  514 QTC Calculation: 505 R Axis:   43  Text Interpretation: Sinus or ectopic atrial rhythm Prolonged PR interval IVCD, consider atypical LBBB No significant change since last tracing Confirmed by Dean Clarity [phone]) on 09/03/2023 1:57:13 PM  Radiology: CT Cervical Spine Wo Contrast Result Date: 09/03/2023 CLINICAL DATA:  Neck pain, chronic EXAM: CT CERVICAL SPINE WITHOUT CONTRAST TECHNIQUE: Multidetector CT imaging of the cervical spine was performed without intravenous contrast. Multiplanar CT image reconstructions were also generated. RADIATION DOSE REDUCTION: This exam was performed according to the  departmental dose-optimization program which includes automated exposure control, adjustment of the mA and/or kV according to patient size and/or use of iterative reconstruction technique. COMPARISON:  CT of the cervical spine dated November 01, 2021. FINDINGS: Alignment: Physiologic, mild reversal of the normal cervical lordosis. Skull base and vertebrae: Intact and unremarkable. No evidence of fracture or lesion. Soft tissues and spinal canal: No soft tissue swelling or soft tissue injury evident. Mild calcific plaque within the carotid bulbs. Disc levels: There is mild posterior endplate ridging at C5-6. There is no significant spinal canal or neural foraminal stenosis. The disc spaces are preserved otherwise. The spinal canal and neural foramina appear widely patent throughout the cervical spine. Upper chest: The visualized lung apices are clear. Other: None. IMPRESSION:  1. Mild degenerative disc disease at C5-6, without significant spinal canal or neural foraminal stenosis. Electronically Signed   By: Evalene Coho M.D.   On: 09/03/2023 14:34   DG Shoulder Left Result Date: 09/03/2023 CLINICAL DATA:  shoulder pain EXAM: LEFT SHOULDER - 2+ VIEW COMPARISON:  March 12, 2005 FINDINGS: Osteopenia.No acute fracture or dislocation. Mild AC joint space loss, degenerative. Soft tissues are unremarkable. IMPRESSION: No acute fracture or dislocation. Electronically Signed   By: Rogelia Myers M.D.   On: 09/03/2023 14:31     Procedures   Medications Ordered in the ED - No data to display                                  Medical Decision Making Patient here with ongoing pain of her left neck left shoulder and pain radiating to the level of her elbow.  She states her symptoms have been persistent for 1 month.  Minimal temporary relief with Tylenol .  She denies any known injury.  Also denies any numbness or weakness of her extremity or difficulty gripping objects with her left hand.  On my exam, she has  reproducible pain with movement of the left shoulder and rotation of her neck.  Her grip strengths are symmetrical and 5 out of 5 bilaterally.  Sensation is intact.  I suspect musculoskeletal versus radicular pain.  Doubt PE ACS  Amount and/or Complexity of Data Reviewed Radiology: ordered.    Details: ShoulderX-ray of the acute fracture or dislocation.  CT C-spine shows degenerative disc disease at C5 and 6 without spinal canal or foraminal stenosis  Risk Prescription drug management.        Final diagnoses:  Cervical radicular pain    ED Discharge Orders     None          Herlinda Milling, PA-C 09/03/23 1455    Dean Clarity, MD 09/03/23 1511

## 2023-09-09 DIAGNOSIS — Z794 Long term (current) use of insulin: Secondary | ICD-10-CM | POA: Diagnosis not present

## 2023-09-09 DIAGNOSIS — E119 Type 2 diabetes mellitus without complications: Secondary | ICD-10-CM | POA: Diagnosis not present

## 2023-09-16 DIAGNOSIS — E211 Secondary hyperparathyroidism, not elsewhere classified: Secondary | ICD-10-CM | POA: Diagnosis not present

## 2023-09-16 DIAGNOSIS — D631 Anemia in chronic kidney disease: Secondary | ICD-10-CM | POA: Diagnosis not present

## 2023-09-16 DIAGNOSIS — N189 Chronic kidney disease, unspecified: Secondary | ICD-10-CM | POA: Diagnosis not present

## 2023-09-16 DIAGNOSIS — R809 Proteinuria, unspecified: Secondary | ICD-10-CM | POA: Diagnosis not present

## 2023-09-23 DIAGNOSIS — E1122 Type 2 diabetes mellitus with diabetic chronic kidney disease: Secondary | ICD-10-CM | POA: Diagnosis not present

## 2023-09-23 DIAGNOSIS — N2581 Secondary hyperparathyroidism of renal origin: Secondary | ICD-10-CM | POA: Diagnosis not present

## 2023-09-23 DIAGNOSIS — I5032 Chronic diastolic (congestive) heart failure: Secondary | ICD-10-CM | POA: Diagnosis not present

## 2023-09-23 DIAGNOSIS — N184 Chronic kidney disease, stage 4 (severe): Secondary | ICD-10-CM | POA: Diagnosis not present

## 2023-11-16 ENCOUNTER — Ambulatory Visit (HOSPITAL_COMMUNITY)
Admission: RE | Admit: 2023-11-16 | Discharge: 2023-11-16 | Disposition: A | Discharge status: Home / Self Care | Source: Ambulatory Visit | Attending: Student | Admitting: Student

## 2023-11-16 DIAGNOSIS — I5021 Acute systolic (congestive) heart failure: Secondary | ICD-10-CM | POA: Insufficient documentation

## 2023-11-16 LAB — ECHOCARDIOGRAM LIMITED: S' Lateral: 4.4 cm

## 2023-11-16 MED ORDER — PERFLUTREN LIPID MICROSPHERE
1.0000 mL | INTRAVENOUS | Status: AC | PRN
  Administered 2023-11-16: 3 mL via INTRAVENOUS

## 2023-11-16 NOTE — Progress Notes (Signed)
*  PRELIMINARY RESULTS* Echocardiogram Limited 2-D Echocardiogram has been performed with Definity .  Holly Baldwin 11/16/2023, 4:00 PM

## 2023-11-18 ENCOUNTER — Ambulatory Visit: Payer: Self-pay | Admitting: Student

## 2023-12-07 DIAGNOSIS — E113293 Type 2 diabetes mellitus with mild nonproliferative diabetic retinopathy without macular edema, bilateral: Secondary | ICD-10-CM | POA: Diagnosis not present

## 2023-12-10 DIAGNOSIS — E1122 Type 2 diabetes mellitus with diabetic chronic kidney disease: Secondary | ICD-10-CM | POA: Diagnosis not present

## 2023-12-10 DIAGNOSIS — Z794 Long term (current) use of insulin: Secondary | ICD-10-CM | POA: Diagnosis not present

## 2023-12-17 NOTE — Progress Notes (Signed)
 Cardiology Office Note    Date:  12/18/2023  ID:  Holly Baldwin, DOB 1945-06-21, MRN [id number] Cardiologist: Alvan Carrier, MD Cardiology APP:  Holly Holly HERO, PA-C { : History of Present Illness:    Holly Baldwin is a 78 y.o. female  with past medical history of chronic HFrEF (EF 20-25% in 07/2023 in the setting of atrial fibrillation with RVR), HTN, HLD, Type 2 DM and Stage 4 CKD who presents to the office today for 44-month follow-up.  She was last examined by myself in 08/2023 following her recent hospitalization for an acute CHF exacerbation and atrial fibrillation with RVR. Echocardiogram during admission had shown that her EF was reduced at 20 to 25% with global hypokinesis but she was not felt to be a candidate for a cardiac catheterization given her CKD. At the time of follow-up, she reported overall feeling well and denied any chest pain or palpitations. She had converted back to normal sinus rhythm. She was continued on her current medical therapy with Farxiga  5 mg daily (on this per Nephrology), Lasix  40 mg twice daily, Hydralazine  50 mg twice daily, Imdur  30 mg daily, Toprol -XL 150 mg daily and Eliquis  5 mg twice daily for anticoagulation. She was not on an ACE-I/ARB/ARNI given her renal function (creatinine at 2.18 on most recent check). Was recommended to arrange for a follow-up echocardiogram in 3 months for reassessment of her EF and reassessment of pulmonary pressures. Repeat echocardiogram in 11/2023 showed her EF remained reduced at 30 to 35% with global hypokinesis, normal RV function and normal PASP.  Was noted to have a possible small PFO with mild left-to-right shunting.  In talking with the patient today, she reports overall feeling well since her last office visit. She remains active in doing chores around her home and denies any recent chest pain or dyspnea on exertion. No recent palpitations, orthopnea, PND or pitting edema. Reports good compliance with her current  medical therapy but is unsure of the exact medications that she is taking as her pharmacy provides these in a pill pack.  Studies Reviewed:   EKG: EKG is not ordered today.  Limited Echocardiogram: 11/2023 IMPRESSIONS     1. Left ventricular ejection fraction, by estimation, is 30 to 35%. The  left ventricle has moderately decreased function. The left ventricle  demonstrates global hypokinesis. There is mild left ventricular  hypertrophy.   2. Right ventricular systolic function is normal. The right ventricular  size is normal. There is normal pulmonary artery systolic pressure.   3. Possible small PFO with mild left to right shunting.   4. The inferior vena cava is normal in size with greater than 50%  respiratory variability, suggesting right atrial pressure of 3 mmHg.   5. Limited echo evaluate LV function and pulmonary artery pressures   Physical Exam:   VS:  BP (!) 118/58   Pulse (!) 59   Ht 5' 4 (1.626 m)   Wt 168 lb 12.8 oz (76.6 kg)   SpO2 97%   BMI 28.97 kg/m    Wt Readings from Last 3 Encounters:  12/18/23 168 lb 12.8 oz (76.6 kg)  09/03/23 187 lb (84.8 kg)  08/31/23 167 lb 3.2 oz (75.8 kg)     GEN: Well nourished, well developed female appearing in no acute distress NECK: No JVD; No carotid bruits CARDIAC: RRR, no murmurs, rubs, gallops RESPIRATORY:  Clear to auscultation without rales, wheezing or rhonchi  ABDOMEN: Appears non-distended. No obvious abdominal masses. EXTREMITIES: No  clubbing or cyanosis. No pitting edema.  Distal pedal pulses are 2+ bilaterally.   Assessment and Plan:   1. Acute HFrEF (heart failure with reduced ejection fraction) (HCC) - Her ejection fraction was previously 20 to 25% in 07/2023 and PASP was severely elevated at 72.2 mmHg. Repeat echocardiogram in 11/2023 showed her EF had improved to 30 to 35% and she had normal PASP.  - She denies any recent respiratory issues and appears euvolemic by examination today. Continue current  medical therapy with Farxiga  5 mg daily (on this dose per Nephrology), Lasix  40 mg twice daily, Hydralazine  50 mg twice daily, Kerendia 10 mg daily and Toprol -XL 150 mg daily. Previously reviewed with Dr. Alvan and he recommended obtaining a follow-up echocardiogram in 3 months for reassessment of her EF. If this remains reduced, would likely plan for cMRI and possible referral to EP for consideration of CRT-D given her LBBB. Ischemic evaluation has not been pursued given her renal function. If EF remains less than 40%, would also recommend reviewing with Nephrology and stopping Kerendia and switching to Spironolactone .  2. Persistent atrial fibrillation [facility]) - This was a new diagnosis for her during her admission in 07/2023 and she had converted back to normal sinus rhythm at the time of her follow-up appointment. She is in normal sinus rhythm by examination today and denies any recent palpitations. - Continue Toprol -XL 150 mg daily for rate control. She is also on Eliquis  5 mg twice daily for anticoagulation which is the appropriate dose given her current age, weight and renal function. Will require dose adjustment once she turns 78 years old given that her creatinine has consistently been above 1.5.  3. CKD (chronic kidney disease) stage 4, GFR 15-29 ml/min (HCC) - Followed by Dr. Rachele. Creatinine was at 1.99 when checked in 09/2023.  he reports having follow-up with him later this month. She is on Kerendia 10mg  daily.   4. Essential hypertension - BP is well-controlled at 118/58 during today's visit. Continue current medical therapy with Lasix  40 mg twice daily, Hydralazine  50 mg twice daily, Imdur  30 mg daily and Toprol -XL 150 mg daily.  5. HLD - Followed by her PCP.  Remains on Simvastatin  40 mg daily.  Signed, Holly CHRISTELLA Qua, PA-C

## 2023-12-18 ENCOUNTER — Telehealth: Payer: Self-pay

## 2023-12-18 ENCOUNTER — Ambulatory Visit: Attending: Student | Admitting: Student

## 2023-12-18 ENCOUNTER — Encounter: Payer: Self-pay | Admitting: Student

## 2023-12-18 VITALS — BP 118/58 | HR 59 | Ht 64.0 in | Wt 168.8 lb

## 2023-12-18 DIAGNOSIS — I4819 Other persistent atrial fibrillation: Secondary | ICD-10-CM | POA: Diagnosis not present

## 2023-12-18 DIAGNOSIS — N184 Chronic kidney disease, stage 4 (severe): Secondary | ICD-10-CM

## 2023-12-18 DIAGNOSIS — E785 Hyperlipidemia, unspecified: Secondary | ICD-10-CM | POA: Diagnosis not present

## 2023-12-18 DIAGNOSIS — I5021 Acute systolic (congestive) heart failure: Secondary | ICD-10-CM | POA: Diagnosis not present

## 2023-12-18 DIAGNOSIS — I10 Essential (primary) hypertension: Secondary | ICD-10-CM | POA: Diagnosis not present

## 2023-12-18 NOTE — Patient Instructions (Signed)
 Medication Instructions:  Your physician recommends that you continue on your current medications as directed. Please refer to the Current Medication list given to you today.  *If you need a refill on your cardiac medications before your next appointment, please call your pharmacy*  Lab Work: None If you have labs (blood work) drawn today and your tests are completely normal, you will receive your results only by: MyChart Message (if you have MyChart) OR A paper copy in the mail If you have any lab test that is abnormal or we need to change your treatment, we will call you to review the results.  Testing/Procedures: Your physician has requested that you have an echocardiogram. Echocardiography is a painless test that uses sound waves to create images of your heart. It provides your doctor with information about the size and shape of your heart and how well your heart's chambers and valves are working. This procedure takes approximately one hour. There are no restrictions for this procedure. Please do NOT wear cologne, perfume, aftershave, or lotions (deodorant is allowed). Please arrive 15 minutes prior to your appointment time.  Please note: We ask at that you not bring children with you during ultrasound (echo/ vascular) testing. Due to room size and safety concerns, children are not allowed in the ultrasound rooms during exams. Our front office staff cannot provide observation of children in our lobby area while testing is being conducted. An adult accompanying a patient to their appointment will only be allowed in the ultrasound room at the discretion of the ultrasound technician under special circumstances. We apologize for any inconvenience.   Follow-Up: At [facility], you and your health needs are our priority.  As part of our continuing mission to provide you with exceptional heart care, our providers are all part of one team.  This team includes your primary Cardiologist  (physician) and Advanced Practice Providers or APPs (Physician Assistants and Nurse Practitioners) who all work together to provide you with the care you need, when you need it.  Your next appointment:   3 month(s)  Provider:   You may see Armida Lander, MD or one of the following Advanced Practice Providers on your designated Care Team:   Woodfin Hays, PA-C  Scotesia Houghton, [place] Theotis Flake, [place]     We recommend signing up for the patient portal called "MyChart".  Sign up information is provided on this After Visit Summary.  MyChart is used to connect with patients for Virtual Visits (Telemedicine).  Patients are able to view lab/test results, encounter notes, upcoming appointments, etc.  Non-urgent messages can be sent to your provider as well.   To learn more about what you can do with MyChart, go to [email or web address].   Other Instructions

## 2023-12-18 NOTE — Telephone Encounter (Signed)
 Tried to return pt's call, did not receive an answer and was unable to leave a message.

## 2023-12-23 DIAGNOSIS — E039 Hypothyroidism, unspecified: Secondary | ICD-10-CM | POA: Diagnosis not present

## 2023-12-23 DIAGNOSIS — D631 Anemia in chronic kidney disease: Secondary | ICD-10-CM | POA: Diagnosis not present

## 2023-12-23 DIAGNOSIS — E119 Type 2 diabetes mellitus without complications: Secondary | ICD-10-CM | POA: Diagnosis not present

## 2023-12-23 DIAGNOSIS — N189 Chronic kidney disease, unspecified: Secondary | ICD-10-CM | POA: Diagnosis not present

## 2023-12-23 DIAGNOSIS — I10 Essential (primary) hypertension: Secondary | ICD-10-CM | POA: Diagnosis not present

## 2023-12-23 DIAGNOSIS — R809 Proteinuria, unspecified: Secondary | ICD-10-CM | POA: Diagnosis not present

## 2023-12-24 LAB — T4, FREE: Free T4: 1.3 ng/dL (ref 0.82–1.77)

## 2023-12-24 LAB — TSH: TSH: 1.1 u[IU]/mL (ref 0.450–4.500)

## 2023-12-30 DIAGNOSIS — N179 Acute kidney failure, unspecified: Secondary | ICD-10-CM | POA: Diagnosis not present

## 2023-12-30 DIAGNOSIS — N184 Chronic kidney disease, stage 4 (severe): Secondary | ICD-10-CM | POA: Diagnosis not present

## 2023-12-30 DIAGNOSIS — E1122 Type 2 diabetes mellitus with diabetic chronic kidney disease: Secondary | ICD-10-CM | POA: Diagnosis not present

## 2023-12-30 DIAGNOSIS — I5032 Chronic diastolic (congestive) heart failure: Secondary | ICD-10-CM | POA: Diagnosis not present

## 2024-01-01 ENCOUNTER — Ambulatory Visit (INDEPENDENT_AMBULATORY_CARE_PROVIDER_SITE_OTHER): Admitting: Nurse Practitioner

## 2024-01-01 ENCOUNTER — Encounter: Payer: Self-pay | Admitting: Nurse Practitioner

## 2024-01-01 VITALS — BP 110/72 | HR 64 | Ht 64.0 in | Wt 172.6 lb

## 2024-01-01 DIAGNOSIS — E782 Mixed hyperlipidemia: Secondary | ICD-10-CM | POA: Diagnosis not present

## 2024-01-01 DIAGNOSIS — Z7984 Long term (current) use of oral hypoglycemic drugs: Secondary | ICD-10-CM | POA: Diagnosis not present

## 2024-01-01 DIAGNOSIS — E039 Hypothyroidism, unspecified: Secondary | ICD-10-CM

## 2024-01-01 DIAGNOSIS — N184 Chronic kidney disease, stage 4 (severe): Secondary | ICD-10-CM

## 2024-01-01 DIAGNOSIS — E1122 Type 2 diabetes mellitus with diabetic chronic kidney disease: Secondary | ICD-10-CM | POA: Diagnosis not present

## 2024-01-01 DIAGNOSIS — Z794 Long term (current) use of insulin: Secondary | ICD-10-CM | POA: Diagnosis not present

## 2024-01-01 DIAGNOSIS — I10 Essential (primary) hypertension: Secondary | ICD-10-CM | POA: Diagnosis not present

## 2024-01-01 LAB — POCT GLYCOSYLATED HEMOGLOBIN (HGB A1C): Hemoglobin A1C: 7.8 % — AB (ref 4.0–5.6)

## 2024-01-01 MED ORDER — DAPAGLIFLOZIN PROPANEDIOL 5 MG PO TABS: 5.0000 mg | ORAL_TABLET | Freq: Every day | ORAL | 1 refills | Status: DC

## 2024-01-01 MED ORDER — LEVOTHYROXINE SODIUM 100 MCG PO TABS: 100.0000 ug | ORAL_TABLET | Freq: Every day | ORAL | 1 refills | Status: DC

## 2024-01-01 MED ORDER — LANTUS SOLOSTAR 100 UNIT/ML ~~LOC~~ SOPN: 15.0000 [IU] | PEN_INJECTOR | Freq: Every day | SUBCUTANEOUS | 3 refills | Status: DC

## 2024-01-01 NOTE — Progress Notes (Signed)
 01/01/2024      Endocrinology follow-up note   Subjective:    Patient ID: Holly Baldwin, female    DOB: 1945/05/12,    Past Medical History:  Diagnosis Date   Anxiety    Chronic abdominal pain    Diabetes mellitus    GERD (gastroesophageal reflux disease)    Hiatal hernia    Hypercholesteremia    Hypertension    Past Surgical History:  Procedure Laterality Date   ABDOMINAL HYSTERECTOMY     CATARACT EXTRACTION W/PHACO Right 01/09/2020   Procedure: CATARACT EXTRACTION PHACO AND INTRAOCULAR LENS PLACEMENT (IOC);  Surgeon: Harrie Agent, MD;  Location: [facility];  Service: Ophthalmology;  Laterality: Right;  CDE: 12.01   CATARACT EXTRACTION W/PHACO Left 01/23/2020   Procedure: CATARACT EXTRACTION PHACO AND INTRAOCULAR LENS PLACEMENT LEFT EYE;  Surgeon: Harrie Agent, MD;  Location: [facility];  Service: Ophthalmology;  Laterality: Left;  CDE 8.71   COLONOSCOPY N/A 06/23/2012   Procedure: COLONOSCOPY;  Surgeon: Claudis RAYMOND Rivet, MD;  Location: [facility];  Service: Endoscopy;  Laterality: N/A;  100-moved to 1200 Ann to notify pt   ESOPHAGOGASTRODUODENOSCOPY N/A 05/06/2012   Procedure: ESOPHAGOGASTRODUODENOSCOPY (EGD);  Surgeon: Claudis RAYMOND Rivet, MD;  [facility]: [facility];  Service: Endoscopy;  Laterality: N/A;   MASS EXCISION Right 05/26/2012   Procedure: EXCISION NEOPLASM RIGHT THIGH;  Surgeon: Oneil DELENA Budge, MD;  Location: [facility];  Service: General;  Laterality: Right;   Social History   Socioeconomic History   Marital status: Married    Spouse name: Not on file   Number of children: Not on file   Years of education: Not on file   Highest education level: Not on file  Occupational History   Not on file  Tobacco Use   Smoking status: Never   Smokeless tobacco: Never  Vaping Use   Vaping status: Never Used  Substance and Sexual Activity   Alcohol use: No   Drug use: No   Sexual activity: Not Currently    Birth control/protection: Post-menopausal  Other Topics Concern    Not on file  Social History Narrative   Not on file   Social Drivers of Health   Financial Resource Strain: Low Risk  (01/20/2023)   Overall Financial Resource Strain (CARDIA)    Difficulty of Paying Living Expenses: Not very hard  Food Insecurity: No Food Insecurity (07/17/2023)   Hunger Vital Sign    Worried About Running Out of Food in the Last Year: Never true    Ran Out of Food in the Last Year: Never true  Transportation Needs: No Transportation Needs (07/17/2023)   PRAPARE - Administrator, Civil Service (Medical): No    Lack of Transportation (Non-Medical): No  Physical Activity: Inactive (12/17/2022)   Exercise Vital Sign    Days of Exercise per Week: 0 days    Minutes of Exercise per Session: 0 min  Stress: No Stress Concern Present (05/07/2020)   [employer] of Occupational Health - Occupational Stress Questionnaire    Feeling of Stress : Not at all  Social Connections: Moderately Integrated (07/17/2023)   Social Connection and Isolation Panel    Frequency of Communication with Friends and Family: More than three times a week    Frequency of Social Gatherings with Friends and Family: Never    Attends Religious Services: More than 4 times per year    Active Member of [employer] or Organizations: No    Attends Banker Meetings:  Never    Marital Status: Married   Outpatient Encounter Medications as of 01/01/2024  Medication Sig   albuterol  (VENTOLIN  HFA) 108 (90 Base) MCG/ACT inhaler Inhale 2 puffs into the lungs every 6 (six) hours as needed for wheezing or shortness of breath.   apixaban  (ELIQUIS ) 5 MG TABS tablet Take 1 tablet (5 mg total) by mouth 2 (two) times daily.   escitalopram (LEXAPRO) 10 MG tablet Take 10 mg by mouth daily.   furosemide  (LASIX ) 40 MG tablet Take 1 tablet (40 mg total) by mouth 2 (two) times daily.   Homeopathic Products (LEG CRAMPS PM SL) Take 1 tablet by mouth daily as needed (Cramps).   hydrALAZINE  (APRESOLINE ) 50 MG  tablet Take 1 tablet (50 mg total) by mouth 2 (two) times daily.   isosorbide  mononitrate (IMDUR ) 30 MG 24 hr tablet Take 1 tablet (30 mg total) by mouth daily.   KERENDIA 10 MG TABS Take 1 tablet by mouth daily.   methylPREDNISolone  (MEDROL  DOSEPAK) 4 MG TBPK tablet Take 6 tablets day one, 5 tablets day two, 4 tablets day three, 3 tablets day four, 2 tablets day five, then 1 tablet day six   metoprolol  succinate (TOPROL -XL) 50 MG 24 hr tablet Take 3 tablets (150 mg total) by mouth daily. Take with or immediately following a meal.   pantoprazole  (PROTONIX ) 40 MG tablet Take 1 tablet (40 mg total) by mouth daily.   simvastatin  (ZOCOR ) 40 MG tablet TAKE 1 TABLET EVERY DAY   traMADol  (ULTRAM ) 50 MG tablet Take 1 tablet (50 mg total) by mouth every 6 (six) hours as needed.   ziprasidone  (GEODON ) 40 MG capsule Take 40 mg by mouth at bedtime.   [DISCONTINUED] dapagliflozin  propanediol (FARXIGA ) 5 MG TABS tablet Take 1 tablet (5 mg total) by mouth daily.   [DISCONTINUED] insulin  glargine (LANTUS  SOLOSTAR) 100 UNIT/ML Solostar Pen Inject 15 Units into the skin at bedtime.   [DISCONTINUED] levothyroxine  (SYNTHROID ) 100 MCG tablet Take 1 tablet (100 mcg total) by mouth daily before breakfast.   dapagliflozin  propanediol (FARXIGA ) 5 MG TABS tablet Take 1 tablet (5 mg total) by mouth daily.   feeding supplement (ENSURE ENLIVE / ENSURE PLUS) LIQD Take 237 mLs by mouth 2 (two) times daily between meals. (Patient not taking: Reported on 01/01/2024)   insulin  glargine (LANTUS  SOLOSTAR) 100 UNIT/ML Solostar Pen Inject 15 Units into the skin at bedtime.   levothyroxine  (SYNTHROID ) 100 MCG tablet Take 1 tablet (100 mcg total) by mouth daily before breakfast.   Multiple Vitamins-Minerals (PRESERVISION AREDS 2) CAPS Take by mouth 2 (two) times daily. (Patient not taking: Reported on 01/01/2024)   No facility-administered encounter medications on file as of 01/01/2024.   ALLERGIES: Allergies  Allergen Reactions    Bee Venom Anaphylaxis   Penicillins Hives   VACCINATION STATUS:  There is no immunization history on file for this patient.  Diabetes She presents for her follow-up diabetic visit. She has type 2 diabetes mellitus. Onset time: she was diagnosed at approximate age of 49 years. Her disease course has been stable. There are no hypoglycemic associated symptoms. Pertinent negatives for hypoglycemia include no confusion, nervousness/anxiousness, pallor, seizures or tremors. Pertinent negatives for diabetes include no fatigue, no polydipsia, no polyphagia, no polyuria and no weight loss. There are no hypoglycemic complications. Symptoms are stable. Diabetic complications include nephropathy. Risk factors for coronary artery disease include diabetes mellitus, dyslipidemia, obesity, sedentary lifestyle and hypertension. Current diabetic treatment includes oral agent (monotherapy) and insulin  injections. She is compliant with  treatment most of the time. Her weight is decreasing steadily. She is following a generally healthy diet. When asked about meal planning, she reported none. She has not had a previous visit with a dietitian. She never participates in exercise. Her overall blood glucose range is 140-180 mg/dl. (She presents today with her logs showing no glucose readings since July.  She notes she ran out of sensors and is waiting on [employer] to send her a shipment.  Her POCT A1c today is 7.8%, increasing from last visit of 7.2%.  She notes she has been injecting her insulin  without checking glucose as she cannot get the strips to work in her meter.  ) An ACE inhibitor/angiotensin II receptor blocker is being taken. She does not see a podiatrist.Eye exam is current.  Thyroid  Problem Presents for follow-up (She is currently on levothyroxine  137 mcg p.o. nightly.  She reports compliance with medication.) visit. Patient reports no anxiety, cold intolerance, constipation, depressed mood, diarrhea, fatigue, heat  intolerance, tremors, weight gain or weight loss. The symptoms have been stable.    Review of systems  Constitutional: + Minimally fluctuating body weight,  current Body mass index is 29.63 kg/m. , no fatigue, no subjective hyperthermia, no subjective hypothermia Eyes: no blurry vision, no xerophthalmia ENT: no sore throat, no nodules palpated in throat, no dysphagia/odynophagia, no hoarseness Cardiovascular: no chest pain, no shortness of breath, no palpitations, no leg swelling Respiratory: no cough, no shortness of breath Gastrointestinal: no nausea/vomiting/diarrhea Musculoskeletal: no muscle/joint aches Skin: no rashes, no hyperemia Neurological: no tremors, no numbness, no tingling, no dizziness Psychiatric: no depression, no anxiety   Objective:    BP 110/72 (BP Location: Left Arm, Patient Position: Sitting, Cuff Size: Large)   Pulse 64   Ht 5' 4 (1.626 m)   Wt 172 lb 9.6 oz (78.3 kg)   BMI 29.63 kg/m   Wt Readings from Last 3 Encounters:  01/01/24 172 lb 9.6 oz (78.3 kg)  12/18/23 168 lb 12.8 oz (76.6 kg)  09/03/23 187 lb (84.8 kg)    BP Readings from Last 3 Encounters:  01/01/24 110/72  12/18/23 (!) 118/58  11/16/23 (!) 156/74     Physical Exam- Limited  Constitutional:  Body mass index is 29.63 kg/m. , not in acute distress, ? Memory impairment Eyes:  EOMI, no exophthalmos Musculoskeletal: no gross deformities, strength intact in all four extremities, no gross restriction of joint movements Skin:  no rashes, no hyperemia Neurological: no tremor with outstretched hands   Diabetic Foot Exam - Simple   No data filed     Results for orders placed or performed in visit on 01/01/24  HgB A1c   Collection Time: 01/01/24 10:08 AM  Result Value Ref Range   Hemoglobin A1C 7.8 (A) 4.0 - 5.6 %   HbA1c POC (<> result, manual entry)     HbA1c, POC (prediabetic range)     HbA1c, POC (controlled diabetic range)     Diabetic Labs (most recent): Lab Results   Component Value Date   HGBA1C 7.8 (A) 01/01/2024   HGBA1C 7.0 (A) 05/01/2023   HGBA1C 7.0 (A) 12/10/2022   MICROALBUR 172.4 12/14/2017   MICROALBUR >600.0 03/09/2017   MICROALBUR 10.7 01/23/2016   Lipid Panel     Component Value Date/Time   CHOL 139 07/21/2022 0847   CHOL 184 05/09/2020 0932   TRIG 155 (H) 07/21/2022 0847   HDL 41 07/21/2022 0847   HDL 42 05/09/2020 0932   CHOLHDL 3.4 07/21/2022 0847  VLDL 31 07/21/2022 0847   LDLCALC 67 07/21/2022 0847   LDLCALC 108 (H) 05/09/2020 0932   LDLCALC 105 (H) 12/14/2017 0921     Assessment & Plan:   1) Controlled type 2 diabetes mellitus with complication  Her diabetes is complicated by CAD, neuropathy, microalbuminuria, worsening CKD,  and patient remains at a high risk for more acute and chronic complications of diabetes which include CAD, CVA, CKD, retinopathy, and neuropathy. These are all discussed in detail with the patient.  She presents today with her logs showing no glucose readings since July.  She notes she ran out of sensors and is waiting on [employer]S to send her a shipment.  Her POCT A1c today is 7.8%, increasing from last visit of 7.2%.  She notes she has been injecting her insulin  without checking glucose as she cannot get the strips to work in her meter.    - Nutritional counseling repeated/built upon at each appointment.  - The patient admits there is a room for improvement in their diet and drink choices. -  Suggestion is made for the patient to avoid simple carbohydrates from their diet including Cakes, Sweet Desserts / Pastries, Ice Cream, Soda (diet and regular), Sweet Tea, Candies, Chips, Cookies, Sweet Pastries, Store Bought Juices, Alcohol in Excess of 1-2 drinks a day, Artificial Sweeteners, Coffee Creamer, and Sugar-free Products. This will help patient to have stable blood glucose profile and potentially avoid unintended weight gain.   - I encouraged the patient to switch to unprocessed or minimally  processed complex starch and increased protein intake (animal or plant source), fruits, and vegetables.   - Patient is advised to stick to a routine mealtimes to eat 3 meals a day and avoid unnecessary snacks (to snack only to correct hypoglycemia).  - I have approached patient with the following individualized plan to manage diabetes and patient agrees.  -For safety purposes, Glipizide  should be avoided. With her renal impairment and advanced age, Glipizide  increases her risk of hypoglycemia significantly.  Every attempt will be made to decrease her prandial insulin  due to potential memory impairment which makes hypoglycemia a significant risk for her.  Her ideal A1c would be around 7-7.5% for safety purposes.  I do have some major safety concerns in her case as I do feel she has some memory impairment.  -Due to lack of hypoglycemia, no changes will be made to her regimen today.  She is advised to continue Lantus  15 units SQ nightly and Farxiga  5 mg po daily as recommended by her nephrologist/cardiologist.  I did send in refills of both.  -she is encouraged to continue monitoring blood glucose (using her CGM or traditional fingersticks) at least 4 times daily, before meals and at bedtime and call the clinic if she has readings less than 70 or greater than 200 for 3 tests in a row.  - Patient specific target  for A1c; LDL, HDL, Triglycerides, and  Waist Circumference were discussed in detail.  2) BP/HTN:  Her blood pressure is controlled to target.  She is advised to continue meds as prescribed by cardiology/nephrology.  3) Lipids/HPL: Her most recent lipid panel on 07/21/22 shows controlled LDL at 67.  She is advised to continue Atorvastatin  40 mg po daily at bedtime.  Side effects and precautions discussed with her.    4)  Weight/Diet:  Her Body mass index is 29.63 kg/m. She will benefit from continued modest weight loss.  CDE consult in progress, exercise, and carbohydrates information  provided.  5) Hypothyroidism: -Her previsit TFTs are consistent with appropriate hormone replacement.  She is advised to continue Levothyroxine  100 mg po daily before breakfast (lowered during recent hospitalization)  Will recheck TFTs prior to next visit and adjust dose accordingly.   - The correct intake of thyroid  hormone (Levothyroxine , Synthroid ), is on empty stomach first thing in the morning, with water , separated by at least 30 minutes from breakfast and other medications,  and separated by more than 4 hours from calcium , iron, multivitamins, acid reflux medications (PPIs).  - This medication is a life-long medication and will be needed to correct thyroid  hormone imbalances for the rest of your life.  The dose may change from time to time, based on thyroid  blood work.  - It is extremely important to be consistent taking this medication, near the same time each morning.  -AVOID TAKING PRODUCTS CONTAINING BIOTIN (commonly found in Hair, Skin, Nails vitamins) AS IT INTERFERES WITH THE VALIDITY OF THYROID  FUNCTION BLOOD TESTS.  6) Chronic Care/Health Maintenance: -Patient is on ACE and Statin medications and encouraged to continue to follow up with Ophthalmology, Podiatrist at least yearly or according to recommendations, and advised to  stay away from smoking. I have recommended yearly flu vaccine and pneumonia vaccination at least every 5 years; moderate intensity exercise for up to 150 minutes weekly; and  sleep for at least 7 hours a day.  I advised patient to maintain close follow up with her PCP for primary care needs.  She asked for refill on her blood thinners that her previous PCP was prescribing.  I told her she would need to get her new PCP to prescribe this.     I spent  33  minutes in the care of the patient today including review of labs from CMP, Lipids, Thyroid  Function, Hematology (current and previous including abstractions from other facilities); face-to-face time  discussing  her blood glucose readings/logs, discussing hypoglycemia and hyperglycemia episodes and symptoms, medications doses, her options of short and long term treatment based on the latest standards of care / guidelines;  discussion about incorporating lifestyle medicine;  and documenting the encounter. Risk reduction counseling performed per USPSTF guidelines to reduce obesity and cardiovascular risk factors.     Please refer to Patient Instructions for Blood Glucose Monitoring and Insulin /Medications Dosing Guide  in media tab for additional information. Please  also refer to  Patient Self Inventory in the Media  tab for reviewed elements of pertinent patient history.  Cesily B Cato participated in the discussions, expressed understanding, and voiced agreement with the above plans.  All questions were answered to her satisfaction. she is encouraged to contact clinic should she have any questions or concerns prior to her return visit.    Follow up plan: Return in about 4 months (around 04/30/2024) for Diabetes F/U with A1c in office, No previsit labs, Bring meter and logs.   [place], [facility] [address] [place], [place] [address] Phone: [phone] Fax: [phone]  01/01/2024, 10:18 AM

## 2024-03-08 ENCOUNTER — Encounter (INDEPENDENT_AMBULATORY_CARE_PROVIDER_SITE_OTHER): Payer: Self-pay | Admitting: *Deleted

## 2024-03-21 ENCOUNTER — Ambulatory Visit (HOSPITAL_COMMUNITY)
Admission: RE | Admit: 2024-03-21 | Discharge: 2024-03-21 | Disposition: A | Discharge status: Home / Self Care | Source: Ambulatory Visit | Attending: Student | Admitting: Student

## 2024-03-21 DIAGNOSIS — I5021 Acute systolic (congestive) heart failure: Secondary | ICD-10-CM | POA: Insufficient documentation

## 2024-03-21 LAB — ECHOCARDIOGRAM LIMITED
Calc EF: 25.7 %
S' Lateral: 5.5 cm
Single Plane A2C EF: 29.9 %
Single Plane A4C EF: 21.9 %

## 2024-03-21 MED ORDER — PERFLUTREN LIPID MICROSPHERE
1.0000 mL | INTRAVENOUS | Status: AC | PRN
  Administered 2024-03-21: 3 mL via INTRAVENOUS

## 2024-03-21 NOTE — Progress Notes (Addendum)
*  PRELIMINARY RESULTS* Echocardiogram Limited 2-D Echocardiogram has been performed with Definity . Patient states she hasn't sleep well the last three nights. Feels tired.  Teresa Aida PARAS 03/21/2024, 11:21 AM

## 2024-03-22 NOTE — Progress Notes (Unsigned)
 "  Cardiology Office Note    Date:  03/24/2024  ID:  Holly Baldwin, DOB 06/01/1945, MRN [id number] Cardiologist: Alvan Carrier, MD Cardiology APP:  Johnson Laymon HERO, PA-C { :  History of Present Illness:    LORENIA Baldwin is a 79 y.o. female with past medical history of chronic HFrEF (EF 20-25% in 07/2023 in the setting of atrial fibrillation with RVR, EF at 30 to 35% by echocardiogram in 11/2023), paroxysmal atrial fibrillation, HTN, HLD, Type 2 DM and Stage 4 CKD who presents to the office today for 16-month follow-up.   She was last examined by myself in 12/2023 and reported overall feeling well and denied any recent anginal symptoms at that time. In regards to her cardiomyopathy, her case was reviewed with Dr. Alvan and he recommended obtaining a follow-up echocardiogram in 3 months for a reassessment of her EF and if this remained reduced, would plan for cMRI and likely referral to EP for consideration of CRT-D given her LBBB. Ischemic evaluation had not been pursued given her stage IV CKD. She was continued on her current cardiac medications with Farxiga  5 mg daily (on this per Nephrology), Lasix  40 mg twice daily, Hydralazine  50 mg twice daily, Kerendia 10 mg daily and Toprol -XL 150 mg daily. Was also on Eliquis  for anticoagulation in the setting of prior atrial fibrillation.  In talking with the patient today, she reports having fatigue and dyspnea on exertion for the past several months. No acute change in symptoms. Denies any chest pain or palpitations. No recent orthopnea, PND or pitting edema. She tries to bake most of her food and consumes fruits and vegetables. Does limit sodium intake. Says that she does not have transportation to [place] as the patient and her husband do not drive to [place]. A family friend brought her to the visit today but does not drive to [place] either and she says that her niece who is listed as her emergency contact does not have a car.  Studies  Reviewed:   EKG: EKG is not ordered today.  Limited Echo: 03/21/2024 IMPRESSIONS     1. No LV thrombus by Definity . Left ventricular ejection fraction, by  estimation, is 20 to 25%. Left ventricular ejection fraction by 3D volume  is 37 %. The left ventricle has severely decreased function. The left  ventricle demonstrates global  hypokinesis. The left ventricular internal cavity size was moderately to  severely dilated. There is mild left ventricular hypertrophy. Left  ventricular diastolic function could not be evaluated.   2. Right ventricular systolic function is moderately reduced. The right  ventricular size is mildly enlarged. Tricuspid regurgitation signal is  inadequate for assessing PA pressure.   3. The inferior vena cava is dilated in size with >50% respiratory  variability, suggesting right atrial pressure of 8 mmHg.   Comparison(s): Changes from prior study are noted. LV and RV function  worsened.    Risk Assessment/Calculations:    CHA2DS2-VASc Score = 6   This indicates a 9.7% annual risk of stroke. The patient's score is based upon: CHF History: 1 HTN History: 1 Diabetes History: 1 Stroke History: 0 Vascular Disease History: 0 Age Score: 2 Gender Score: 1    Physical Exam:   VS:  BP 130/76   Pulse (!) 56   Ht 5' 4 (1.626 m)   Wt 176 lb 12.8 oz (80.2 kg)   SpO2 97%   BMI 30.35 kg/m    Wt Readings from Last 3 Encounters:  03/23/24  176 lb 12.8 oz (80.2 kg)  01/01/24 172 lb 9.6 oz (78.3 kg)  12/18/23 168 lb 12.8 oz (76.6 kg)     GEN: Well nourished, well developed in no acute distress NECK: No JVD; No carotid bruits CARDIAC: RRR, no murmurs, rubs, gallops RESPIRATORY:  Clear to auscultation without rales, wheezing or rhonchi  ABDOMEN: Appears non-distended. No obvious abdominal masses. EXTREMITIES: No clubbing or cyanosis. No pitting edema.  Distal pedal pulses are 2+ bilaterally.   Assessment and Plan:   1. Acute HFrEF (heart failure  with reduced ejection fraction) (HCC) - Her EF has remained reduced since at least 07/2023 and was at 20 to 25% by most recent echocardiogram in 03/2024 and RV function was moderately reduced as well. It was previously recommended to arrange for cMRI if her EF remained reduced and we reviewed indications for this today. At this time, she reports having no transportation options to [place]. Will reach out to our social work team today to see if any options are available as she would benefit from this along with evaluation by the Advanced Heart Failure team. Ischemic evaluation has not been pursued given her CKD. - We also discussed referral to EP for consideration of CRT-D given her cardiomyopathy and persistent LBBB. Again offered an appointment in [place] for soonest availability but she does not have transportation to [place]. Therefore, will add to the wait list for an appointment with Dr. Almetta here in [place].  - For now, continue current medical therapy with Farxiga  5 mg daily (managed by Nephrology), Kerdenia 10 mg daily (managed by Nephrology), Lasix  40 mg twice daily (also managed by Nephrology), Hydralazine  50 mg twice daily, Imdur  30 mg daily and Toprol -XL 150 mg daily. She has not been on an ACE-I/ARB/ARNi/MRA given her renal function.   2. Paroxysmal atrial fibrillation (HCC) - She denies any recent palpitations and is in normal sinus rhythm by examination today. Continue Toprol -XL 150 mg daily for rate-control. - No reports of active bleeding. She remains on Eliquis  5 mg twice daily for anticoagulation which is the correct dose given her current age (79 years old), weight (176 lbs) and renal function (creatinine at 2.09 on most recent check). Once she is 79 years old, will need to reduce dosing to 2.5 mg twice daily given her renal function. If not adjusted in the interim, would reduce at her next follow-up visit given her birthday in 05/2025.  3. Essential hypertension - Blood  pressure is well-controlled at 130/76 during today's visit. Continue current medical therapy with Hydralazine  50 mg twice daily, Imdur  30 mg daily and Toprol -XL 150 mg daily.  4. Hyperlipidemia LDL goal <70 - Followed by her PCP. Remains on Simvastatin  40 mg daily.  5. CKD (chronic kidney disease) stage 4, GFR 15-29 ml/min (HCC) - Her creatinine was stable at 2.09 when checked in 01/2024 by review of [facility] DXA. Followed by Dr. Rachele.   Signed, Laymon CHRISTELLA Qua, PA-C   "

## 2024-03-23 ENCOUNTER — Ambulatory Visit: Payer: Self-pay | Admitting: Student

## 2024-03-23 ENCOUNTER — Ambulatory Visit: Attending: Student | Admitting: Student

## 2024-03-23 ENCOUNTER — Encounter: Payer: Self-pay | Admitting: Student

## 2024-03-23 VITALS — BP 130/76 | HR 56 | Ht 64.0 in | Wt 176.8 lb

## 2024-03-23 DIAGNOSIS — I480 Paroxysmal atrial fibrillation: Secondary | ICD-10-CM

## 2024-03-23 DIAGNOSIS — N184 Chronic kidney disease, stage 4 (severe): Secondary | ICD-10-CM

## 2024-03-23 DIAGNOSIS — I5021 Acute systolic (congestive) heart failure: Secondary | ICD-10-CM

## 2024-03-23 DIAGNOSIS — E785 Hyperlipidemia, unspecified: Secondary | ICD-10-CM | POA: Diagnosis not present

## 2024-03-23 DIAGNOSIS — Z5982 Transportation insecurity: Secondary | ICD-10-CM

## 2024-03-23 DIAGNOSIS — I10 Essential (primary) hypertension: Secondary | ICD-10-CM

## 2024-03-23 NOTE — Patient Instructions (Signed)
 Medication Instructions:  Your physician recommends that you continue on your current medications as directed. Please refer to the Current Medication list given to you today.  *If you need a refill on your cardiac medications before your next appointment, please call your pharmacy*  Lab Work: None If you have labs (blood work) drawn today and your tests are completely normal, you will receive your results only by: MyChart Message (if you have MyChart) OR A paper copy in the mail If you have any lab test that is abnormal or we need to change your treatment, we will call you to review the results.  Testing/Procedures: None  Follow-Up: At [facility], you and your health needs are our priority.  As part of our continuing mission to provide you with exceptional heart care, our providers are all part of one team.  This team includes your primary Cardiologist (physician) and Advanced Practice Providers or APPs (Physician Assistants and Nurse Practitioners) who all work together to provide you with the care you need, when you need it.  Your next appointment:   3 month(s)  Provider:   You may see Alvan Carrier, MD or one of the following Advanced Practice Providers on your designated Care Team:   Laymon Qua, PA-C  Scotesia Screven, [place] Olivia Pavy, [place]     We recommend signing up for the patient portal called MyChart.  Sign up information is provided on this After Visit Summary.  MyChart is used to connect with patients for Virtual Visits (Telemedicine).  Patients are able to view lab/test results, encounter notes, upcoming appointments, etc.  Non-urgent messages can be sent to your provider as well.   To learn more about what you can do with MyChart, go to [email or web address].   Other Instructions

## 2024-03-24 ENCOUNTER — Telehealth: Payer: Self-pay | Admitting: Licensed Clinical Social Worker

## 2024-03-24 ENCOUNTER — Encounter: Payer: Self-pay | Admitting: Student

## 2024-03-24 NOTE — Telephone Encounter (Signed)
 H&V Care Navigation CSW Progress Note  Clinical Social Worker contacted patient by phone to f/u on resources for transportation. No answer today at [phone]- no voicemail set up. Will re-attempt again as able.  Patient is participating in a Managed Medicaid Plan:  No, U[employer] Medicare Dual Complete  SDOH Screenings   Food Insecurity: No Food Insecurity (07/17/2023)  Housing: Low Risk (07/17/2023)  Transportation Needs: No Transportation Needs (07/17/2023)  Utilities: Not At Risk (07/17/2023)  Financial Resource Strain: Low Risk (01/20/2023)  Physical Activity: Inactive (12/17/2022)  Social Connections: Moderately Integrated (07/17/2023)  Tobacco Use: Low Risk (03/23/2024)    Holly Baldwin, MSW, LCSW Clinical Social Worker II [facility] Heart/Vascular Care Navigation  [phone]- work cell phone (preferred)

## 2024-03-29 ENCOUNTER — Telehealth: Payer: Self-pay | Admitting: Licensed Clinical Social Worker

## 2024-04-03 NOTE — Telephone Encounter (Signed)
 H&V Care Navigation CSW Progress Note  Clinical Social Worker contacted patient by phone to f/u on referral. Pt spouse answered at [phone]. He shares that pt passed this morning. Okay with me passing on to providers. Encouraged him to call us  if there is anything we can do for him/their family.  Patient is participating in a Managed Medicaid Plan:  No, UHC Medicare  SDOH Screenings   Food Insecurity: No Food Insecurity (07/17/2023)  Housing: Low Risk (07/17/2023)  Transportation Needs: No Transportation Needs (07/17/2023)  Utilities: Not At Risk (07/17/2023)  Financial Resource Strain: Low Risk (01/20/2023)  Physical Activity: Inactive (12/17/2022)  Social Connections: Moderately Integrated (07/17/2023)  Tobacco Use: Low Risk (03/24/2024)    Marit Lark, MSW, LCSW Clinical Social Worker II [facility] Heart/Vascular Care Navigation  [phone]- work cell phone (preferred)

## 2024-04-03 DEATH — deceased

## 2024-05-02 ENCOUNTER — Ambulatory Visit: Admitting: Nurse Practitioner

## 2024-07-01 ENCOUNTER — Ambulatory Visit: Admitting: Cardiology
